# Patient Record
Sex: Male | Born: 1965 | Race: Black or African American | Hispanic: No | Smoking: Current some day smoker
Health system: Southern US, Community
[De-identification: ages and names within clinical notes are randomized; demographics above are authoritative.]

## PROBLEM LIST (undated history)

## (undated) ENCOUNTER — Emergency Department (HOSPITAL_COMMUNITY): Admission: EM | Payer: Medicaid Other

## (undated) VITALS — BP 105/59 | HR 64 | Temp 97.6°F | Resp 20 | Ht 64.0 in | Wt 116.0 lb

## (undated) DIAGNOSIS — F1011 Alcohol abuse, in remission: Secondary | ICD-10-CM

## (undated) DIAGNOSIS — Z720 Tobacco use: Secondary | ICD-10-CM

## (undated) DIAGNOSIS — K259 Gastric ulcer, unspecified as acute or chronic, without hemorrhage or perforation: Secondary | ICD-10-CM

## (undated) DIAGNOSIS — F1210 Cannabis abuse, uncomplicated: Secondary | ICD-10-CM

## (undated) DIAGNOSIS — F1411 Cocaine abuse, in remission: Secondary | ICD-10-CM

## (undated) DIAGNOSIS — R55 Syncope and collapse: Secondary | ICD-10-CM

## (undated) DIAGNOSIS — I2510 Atherosclerotic heart disease of native coronary artery without angina pectoris: Secondary | ICD-10-CM

## (undated) DIAGNOSIS — M1990 Unspecified osteoarthritis, unspecified site: Secondary | ICD-10-CM

## (undated) DIAGNOSIS — R001 Bradycardia, unspecified: Secondary | ICD-10-CM

## (undated) DIAGNOSIS — R9431 Abnormal electrocardiogram [ECG] [EKG]: Secondary | ICD-10-CM

## (undated) DIAGNOSIS — J189 Pneumonia, unspecified organism: Secondary | ICD-10-CM

## (undated) DIAGNOSIS — F319 Bipolar disorder, unspecified: Secondary | ICD-10-CM

## (undated) DIAGNOSIS — C801 Malignant (primary) neoplasm, unspecified: Secondary | ICD-10-CM

## (undated) DIAGNOSIS — K219 Gastro-esophageal reflux disease without esophagitis: Secondary | ICD-10-CM

## (undated) DIAGNOSIS — R0789 Other chest pain: Secondary | ICD-10-CM

## (undated) HISTORY — PX: CARDIAC CATHETERIZATION: SHX172

---

## 1999-09-01 ENCOUNTER — Emergency Department (HOSPITAL_COMMUNITY): Admission: EM | Admit: 1999-09-01 | Discharge: 1999-09-01 | Payer: Self-pay | Admitting: Emergency Medicine

## 1999-09-01 ENCOUNTER — Encounter: Payer: Self-pay | Admitting: Emergency Medicine

## 2002-10-11 ENCOUNTER — Encounter: Payer: Self-pay | Admitting: Emergency Medicine

## 2002-10-11 ENCOUNTER — Emergency Department (HOSPITAL_COMMUNITY): Admission: EM | Admit: 2002-10-11 | Discharge: 2002-10-11 | Payer: Self-pay | Admitting: Emergency Medicine

## 2002-12-05 ENCOUNTER — Emergency Department (HOSPITAL_COMMUNITY): Admission: EM | Admit: 2002-12-05 | Discharge: 2002-12-05 | Payer: Self-pay | Admitting: Emergency Medicine

## 2002-12-05 ENCOUNTER — Encounter: Payer: Self-pay | Admitting: Orthopedic Surgery

## 2002-12-05 ENCOUNTER — Encounter: Payer: Self-pay | Admitting: Emergency Medicine

## 2002-12-17 ENCOUNTER — Emergency Department (HOSPITAL_COMMUNITY): Admission: EM | Admit: 2002-12-17 | Discharge: 2002-12-17 | Payer: Self-pay | Admitting: Emergency Medicine

## 2003-02-24 ENCOUNTER — Encounter: Payer: Self-pay | Admitting: Emergency Medicine

## 2003-02-24 ENCOUNTER — Emergency Department (HOSPITAL_COMMUNITY): Admission: EM | Admit: 2003-02-24 | Discharge: 2003-02-24 | Payer: Self-pay | Admitting: Emergency Medicine

## 2006-06-30 ENCOUNTER — Emergency Department (HOSPITAL_COMMUNITY): Admission: EM | Admit: 2006-06-30 | Discharge: 2006-06-30 | Payer: Self-pay | Admitting: Emergency Medicine

## 2006-07-11 ENCOUNTER — Emergency Department (HOSPITAL_COMMUNITY): Admission: EM | Admit: 2006-07-11 | Discharge: 2006-07-11 | Payer: Self-pay | Admitting: Emergency Medicine

## 2006-07-12 ENCOUNTER — Emergency Department (HOSPITAL_COMMUNITY): Admission: EM | Admit: 2006-07-12 | Discharge: 2006-07-12 | Payer: Self-pay | Admitting: Emergency Medicine

## 2007-04-28 ENCOUNTER — Emergency Department (HOSPITAL_COMMUNITY): Admission: EM | Admit: 2007-04-28 | Discharge: 2007-04-28 | Payer: Self-pay | Admitting: Emergency Medicine

## 2007-07-22 ENCOUNTER — Emergency Department (HOSPITAL_COMMUNITY): Admission: EM | Admit: 2007-07-22 | Discharge: 2007-07-22 | Payer: Self-pay | Admitting: Emergency Medicine

## 2007-10-19 ENCOUNTER — Emergency Department (HOSPITAL_COMMUNITY): Admission: EM | Admit: 2007-10-19 | Discharge: 2007-10-19 | Payer: Self-pay | Admitting: Emergency Medicine

## 2007-11-20 ENCOUNTER — Inpatient Hospital Stay (HOSPITAL_COMMUNITY): Admission: EM | Admit: 2007-11-20 | Discharge: 2007-11-22 | Payer: Self-pay | Admitting: Emergency Medicine

## 2007-11-20 ENCOUNTER — Ambulatory Visit: Payer: Self-pay | Admitting: Cardiology

## 2007-12-02 ENCOUNTER — Emergency Department (HOSPITAL_COMMUNITY): Admission: EM | Admit: 2007-12-02 | Discharge: 2007-12-02 | Payer: Self-pay | Admitting: Emergency Medicine

## 2007-12-23 ENCOUNTER — Emergency Department (HOSPITAL_COMMUNITY): Admission: EM | Admit: 2007-12-23 | Discharge: 2007-12-23 | Payer: Self-pay | Admitting: Emergency Medicine

## 2008-02-18 ENCOUNTER — Inpatient Hospital Stay (HOSPITAL_COMMUNITY): Admission: EM | Admit: 2008-02-18 | Discharge: 2008-02-19 | Payer: Self-pay | Admitting: Emergency Medicine

## 2008-02-18 ENCOUNTER — Ambulatory Visit: Payer: Self-pay | Admitting: Family Medicine

## 2008-02-23 ENCOUNTER — Emergency Department (HOSPITAL_COMMUNITY): Admission: EM | Admit: 2008-02-23 | Discharge: 2008-02-23 | Payer: Self-pay | Admitting: Emergency Medicine

## 2008-02-25 ENCOUNTER — Emergency Department (HOSPITAL_COMMUNITY): Admission: EM | Admit: 2008-02-25 | Discharge: 2008-02-26 | Payer: Self-pay | Admitting: Emergency Medicine

## 2008-02-27 ENCOUNTER — Ambulatory Visit: Payer: Self-pay | Admitting: Internal Medicine

## 2008-02-28 ENCOUNTER — Encounter (INDEPENDENT_AMBULATORY_CARE_PROVIDER_SITE_OTHER): Payer: Self-pay | Admitting: Internal Medicine

## 2008-02-28 LAB — CONVERTED CEMR LAB
ALT: 15 units/L (ref 0–53)
AST: 17 units/L (ref 0–37)
Albumin: 4.3 g/dL (ref 3.5–5.2)
Alkaline Phosphatase: 60 units/L (ref 39–117)
Basophils Absolute: 0 10*3/uL (ref 0.0–0.1)
Basophils Relative: 1 % (ref 0–1)
CO2: 23 meq/L (ref 19–32)
Calcium: 9.2 mg/dL (ref 8.4–10.5)
Chloride: 104 meq/L (ref 96–112)
Creatinine, Ser: 1.14 mg/dL (ref 0.40–1.50)
Glucose, Bld: 74 mg/dL (ref 70–99)
HCT: 42 % (ref 39.0–52.0)
Lymphs Abs: 1.6 10*3/uL (ref 0.7–4.0)
MCHC: 35 g/dL (ref 30.0–36.0)
MCV: 91.1 fL (ref 78.0–100.0)
Monocytes Absolute: 0.5 10*3/uL (ref 0.1–1.0)
RDW: 13.1 % (ref 11.5–15.5)
Total Protein: 7.3 g/dL (ref 6.0–8.3)

## 2008-03-23 ENCOUNTER — Inpatient Hospital Stay (HOSPITAL_COMMUNITY): Admission: AD | Admit: 2008-03-23 | Discharge: 2008-03-27 | Payer: Self-pay | Admitting: Psychiatry

## 2008-03-23 ENCOUNTER — Emergency Department (HOSPITAL_COMMUNITY): Admission: EM | Admit: 2008-03-23 | Discharge: 2008-03-23 | Payer: Self-pay | Admitting: Emergency Medicine

## 2008-03-23 ENCOUNTER — Ambulatory Visit: Payer: Self-pay | Admitting: Psychiatry

## 2008-03-26 ENCOUNTER — Emergency Department (HOSPITAL_COMMUNITY): Admission: EM | Admit: 2008-03-26 | Discharge: 2008-03-26 | Payer: Self-pay | Admitting: Emergency Medicine

## 2008-04-05 ENCOUNTER — Emergency Department (HOSPITAL_COMMUNITY): Admission: EM | Admit: 2008-04-05 | Discharge: 2008-04-05 | Payer: Self-pay | Admitting: Emergency Medicine

## 2008-06-08 ENCOUNTER — Emergency Department (HOSPITAL_COMMUNITY): Admission: EM | Admit: 2008-06-08 | Discharge: 2008-06-09 | Payer: Self-pay | Admitting: Emergency Medicine

## 2008-06-09 ENCOUNTER — Emergency Department (HOSPITAL_COMMUNITY): Admission: EM | Admit: 2008-06-09 | Discharge: 2008-06-09 | Payer: Self-pay | Admitting: Emergency Medicine

## 2008-06-10 ENCOUNTER — Inpatient Hospital Stay (HOSPITAL_COMMUNITY): Admission: EM | Admit: 2008-06-10 | Discharge: 2008-06-11 | Payer: Self-pay | Admitting: Emergency Medicine

## 2008-06-10 ENCOUNTER — Ambulatory Visit: Payer: Self-pay | Admitting: Internal Medicine

## 2008-06-11 ENCOUNTER — Encounter (INDEPENDENT_AMBULATORY_CARE_PROVIDER_SITE_OTHER): Payer: Self-pay | Admitting: *Deleted

## 2008-07-04 ENCOUNTER — Ambulatory Visit: Payer: Self-pay | Admitting: Internal Medicine

## 2008-07-15 ENCOUNTER — Ambulatory Visit: Payer: Self-pay | Admitting: Internal Medicine

## 2008-07-23 ENCOUNTER — Ambulatory Visit: Payer: Self-pay | Admitting: *Deleted

## 2008-08-01 ENCOUNTER — Emergency Department (HOSPITAL_COMMUNITY): Admission: EM | Admit: 2008-08-01 | Discharge: 2008-08-01 | Payer: Self-pay | Admitting: Emergency Medicine

## 2008-08-18 ENCOUNTER — Emergency Department (HOSPITAL_COMMUNITY): Admission: EM | Admit: 2008-08-18 | Discharge: 2008-08-18 | Payer: Self-pay | Admitting: Emergency Medicine

## 2008-10-20 ENCOUNTER — Emergency Department (HOSPITAL_COMMUNITY): Admission: EM | Admit: 2008-10-20 | Discharge: 2008-10-20 | Payer: Self-pay | Admitting: Emergency Medicine

## 2009-01-20 ENCOUNTER — Inpatient Hospital Stay (HOSPITAL_COMMUNITY): Admission: EM | Admit: 2009-01-20 | Discharge: 2009-01-21 | Payer: Self-pay | Admitting: Emergency Medicine

## 2009-01-20 ENCOUNTER — Ambulatory Visit: Payer: Self-pay | Admitting: Infectious Disease

## 2009-01-23 ENCOUNTER — Emergency Department (HOSPITAL_COMMUNITY): Admission: EM | Admit: 2009-01-23 | Discharge: 2009-01-23 | Payer: Self-pay | Admitting: Emergency Medicine

## 2009-04-24 ENCOUNTER — Ambulatory Visit: Payer: Self-pay | Admitting: Internal Medicine

## 2009-05-08 ENCOUNTER — Emergency Department (HOSPITAL_COMMUNITY): Admission: EM | Admit: 2009-05-08 | Discharge: 2009-05-08 | Payer: Self-pay | Admitting: Emergency Medicine

## 2009-05-12 ENCOUNTER — Emergency Department (HOSPITAL_COMMUNITY): Admission: EM | Admit: 2009-05-12 | Discharge: 2009-05-12 | Payer: Self-pay | Admitting: Emergency Medicine

## 2009-05-12 ENCOUNTER — Emergency Department (HOSPITAL_COMMUNITY): Admission: EM | Admit: 2009-05-12 | Discharge: 2009-05-13 | Payer: Self-pay | Admitting: Emergency Medicine

## 2009-05-17 ENCOUNTER — Emergency Department (HOSPITAL_COMMUNITY): Admission: EM | Admit: 2009-05-17 | Discharge: 2009-05-17 | Payer: Self-pay | Admitting: Emergency Medicine

## 2009-05-23 ENCOUNTER — Emergency Department (HOSPITAL_COMMUNITY): Admission: EM | Admit: 2009-05-23 | Discharge: 2009-05-23 | Payer: Self-pay | Admitting: Emergency Medicine

## 2009-05-26 ENCOUNTER — Emergency Department (HOSPITAL_COMMUNITY): Admission: EM | Admit: 2009-05-26 | Discharge: 2009-05-26 | Payer: Self-pay | Admitting: Emergency Medicine

## 2009-05-27 ENCOUNTER — Ambulatory Visit: Payer: Self-pay | Admitting: Internal Medicine

## 2009-06-18 ENCOUNTER — Ambulatory Visit (HOSPITAL_COMMUNITY): Admission: RE | Admit: 2009-06-18 | Discharge: 2009-06-18 | Payer: Self-pay | Admitting: Internal Medicine

## 2009-06-23 ENCOUNTER — Ambulatory Visit: Payer: Self-pay | Admitting: Internal Medicine

## 2009-10-01 ENCOUNTER — Ambulatory Visit: Payer: Self-pay | Admitting: Internal Medicine

## 2009-10-08 ENCOUNTER — Other Ambulatory Visit: Payer: Self-pay | Admitting: Emergency Medicine

## 2009-10-08 ENCOUNTER — Ambulatory Visit: Payer: Self-pay | Admitting: Psychiatry

## 2009-10-08 ENCOUNTER — Inpatient Hospital Stay (HOSPITAL_COMMUNITY): Admission: EM | Admit: 2009-10-08 | Discharge: 2009-10-11 | Payer: Self-pay | Admitting: Psychiatry

## 2010-01-08 ENCOUNTER — Other Ambulatory Visit: Payer: Self-pay

## 2010-01-09 ENCOUNTER — Inpatient Hospital Stay (HOSPITAL_COMMUNITY): Admission: RE | Admit: 2010-01-09 | Discharge: 2010-01-12 | Payer: Self-pay | Admitting: Psychiatry

## 2010-01-09 ENCOUNTER — Ambulatory Visit: Payer: Self-pay | Admitting: Psychiatry

## 2010-03-12 ENCOUNTER — Emergency Department (HOSPITAL_COMMUNITY): Admission: EM | Admit: 2010-03-12 | Discharge: 2010-03-12 | Payer: Self-pay | Admitting: Emergency Medicine

## 2010-04-28 ENCOUNTER — Emergency Department (HOSPITAL_COMMUNITY): Admission: EM | Admit: 2010-04-28 | Discharge: 2010-04-28 | Payer: Self-pay | Admitting: Emergency Medicine

## 2010-04-29 ENCOUNTER — Emergency Department (HOSPITAL_COMMUNITY): Admission: EM | Admit: 2010-04-29 | Discharge: 2010-04-30 | Payer: Self-pay | Admitting: Emergency Medicine

## 2010-05-17 ENCOUNTER — Emergency Department (HOSPITAL_COMMUNITY)
Admission: EM | Admit: 2010-05-17 | Discharge: 2010-05-17 | Disposition: A | Discharge status: Other Acute Inpatient Hospital | Payer: Self-pay | Source: Home / Self Care | Admitting: Obstetrics and Gynecology

## 2010-05-18 ENCOUNTER — Inpatient Hospital Stay (HOSPITAL_COMMUNITY)
Admission: AD | Admit: 2010-05-18 | Discharge: 2010-05-24 | Payer: Self-pay | Source: Home / Self Care | Attending: Psychiatry | Admitting: Psychiatry

## 2010-05-18 ENCOUNTER — Emergency Department (HOSPITAL_COMMUNITY)
Admission: EM | Admit: 2010-05-18 | Discharge: 2010-05-18 | Payer: Self-pay | Source: Home / Self Care | Admitting: Emergency Medicine

## 2010-05-18 DIAGNOSIS — F1910 Other psychoactive substance abuse, uncomplicated: Secondary | ICD-10-CM

## 2010-06-28 ENCOUNTER — Encounter: Payer: Self-pay | Admitting: Internal Medicine

## 2010-08-18 LAB — DIFFERENTIAL
Eosinophils Absolute: 0 10*3/uL (ref 0.0–0.7)
Lymphocytes Relative: 20 % (ref 12–46)
Lymphs Abs: 1.5 10*3/uL (ref 0.7–4.0)
Monocytes Relative: 5 % (ref 3–12)
Neutro Abs: 5.4 10*3/uL (ref 1.7–7.7)
Neutrophils Relative %: 74 % (ref 43–77)

## 2010-08-18 LAB — CBC
Hemoglobin: 14.8 g/dL (ref 13.0–17.0)
MCV: 92.1 fL (ref 78.0–100.0)
Platelets: 231 10*3/uL (ref 150–400)
RDW: 12.8 % (ref 11.5–15.5)
WBC: 7.4 10*3/uL (ref 4.0–10.5)

## 2010-08-18 LAB — BASIC METABOLIC PANEL
BUN: 8 mg/dL (ref 6–23)
Calcium: 9 mg/dL (ref 8.4–10.5)
Creatinine, Ser: 1.26 mg/dL (ref 0.4–1.5)
GFR calc Af Amer: >60 mL/min (ref >60)
GFR calc non Af Amer: >60 mL/min (ref >60)
Glucose, Bld: 71 mg/dL (ref 70–99)
Potassium: 3.3 mEq/L — ABNORMAL LOW (ref 3.5–5.1)
Sodium: 140 mEq/L (ref 135–145)

## 2010-08-19 ENCOUNTER — Emergency Department (HOSPITAL_COMMUNITY)
Admission: EM | Admit: 2010-08-19 | Discharge: 2010-08-20 | Disposition: A | Discharge status: Home / Self Care | Payer: Medicaid Other | Attending: Emergency Medicine | Admitting: Emergency Medicine

## 2010-08-19 DIAGNOSIS — F411 Generalized anxiety disorder: Secondary | ICD-10-CM | POA: Insufficient documentation

## 2010-08-19 DIAGNOSIS — I2510 Atherosclerotic heart disease of native coronary artery without angina pectoris: Secondary | ICD-10-CM | POA: Insufficient documentation

## 2010-08-19 DIAGNOSIS — R1013 Epigastric pain: Secondary | ICD-10-CM | POA: Insufficient documentation

## 2010-08-19 LAB — COMPREHENSIVE METABOLIC PANEL
ALT: 42 U/L (ref 0–53)
AST: 49 U/L — ABNORMAL HIGH (ref 0–37)
Alkaline Phosphatase: 70 U/L (ref 39–117)
CO2: 26 mEq/L (ref 19–32)
Calcium: 8.8 mg/dL (ref 8.4–10.5)
Chloride: 100 mEq/L (ref 96–112)
GFR calc Af Amer: >60 mL/min (ref >60)
GFR calc non Af Amer: 58 mL/min — ABNORMAL LOW (ref >60)
Glucose, Bld: 82 mg/dL (ref 70–99)
Sodium: 131 mEq/L — ABNORMAL LOW (ref 135–145)
Total Bilirubin: 0.6 mg/dL (ref 0.3–1.2)

## 2010-08-19 LAB — DIFFERENTIAL
Basophils Absolute: 0 10*3/uL (ref 0.0–0.1)
Basophils Absolute: 0 10*3/uL (ref 0.0–0.1)
Basophils Relative: 0 % (ref 0–1)
Basophils Relative: 1 % (ref 0–1)
Eosinophils Absolute: 0.1 10*3/uL (ref 0.0–0.7)
Eosinophils Relative: 3 % (ref 0–5)
Lymphocytes Relative: 25 % (ref 12–46)
Lymphs Abs: 1.7 10*3/uL (ref 0.7–4.0)
Neutro Abs: 2.6 10*3/uL (ref 1.7–7.7)
Neutrophils Relative %: 41 % — ABNORMAL LOW (ref 43–77)

## 2010-08-19 LAB — CBC
HCT: 39.3 % (ref 39.0–52.0)
Hemoglobin: 13.9 g/dL (ref 13.0–17.0)
MCHC: 35.4 g/dL (ref 30.0–36.0)
MCHC: 35.5 g/dL (ref 30.0–36.0)
Platelets: 159 10*3/uL (ref 150–400)
RBC: 4.32 MIL/uL (ref 4.22–5.81)
RDW: 12.9 % (ref 11.5–15.5)
WBC: 4.7 10*3/uL (ref 4.0–10.5)

## 2010-08-19 LAB — BASIC METABOLIC PANEL
BUN: 5 mg/dL — ABNORMAL LOW (ref 6–23)
Calcium: 8.9 mg/dL (ref 8.4–10.5)
Creatinine, Ser: 1.12 mg/dL (ref 0.4–1.5)
GFR calc non Af Amer: >60 mL/min (ref >60)
Glucose, Bld: 101 mg/dL — ABNORMAL HIGH (ref 70–99)

## 2010-08-19 LAB — POCT CARDIAC MARKERS
CKMB, poc: <1 ng/mL — ABNORMAL LOW (ref 1.0–8.0)
Myoglobin, poc: 65.9 ng/mL (ref 12–200)
Troponin i, poc: <0.05 ng/mL (ref 0.00–0.09)
Troponin i, poc: <0.05 ng/mL (ref 0.00–0.09)
Troponin i, poc: <0.05 ng/mL (ref 0.00–0.09)
Troponin i, poc: <0.05 ng/mL (ref 0.00–0.09)

## 2010-08-19 LAB — URINALYSIS, ROUTINE W REFLEX MICROSCOPIC
Bilirubin Urine: NEGATIVE
Ketones, ur: NEGATIVE mg/dL
Nitrite: NEGATIVE
Protein, ur: NEGATIVE mg/dL
Urobilinogen, UA: 0.2 mg/dL (ref 0.0–1.0)
pH: 6.5 (ref 5.0–8.0)

## 2010-08-19 LAB — RAPID URINE DRUG SCREEN, HOSP PERFORMED
Amphetamines: NOT DETECTED
Benzodiazepines: NOT DETECTED
Cocaine: NOT DETECTED
Cocaine: NOT DETECTED
Opiates: NOT DETECTED
Tetrahydrocannabinol: POSITIVE — AB
Tetrahydrocannabinol: POSITIVE — AB

## 2010-08-19 LAB — D-DIMER, QUANTITATIVE: D-Dimer, Quant: 0.24 ug/mL-FEU (ref 0.00–0.48)

## 2010-08-19 IMAGING — CR DG LUMBAR SPINE COMPLETE 4+V
5 series · 5 of 5 positions shown · non-contrast
Comparison: None

CLINICAL DATA: Pain

LUMBAR SPINE - COMPLETE 4+ VIEW

[t l-spine a.p.]
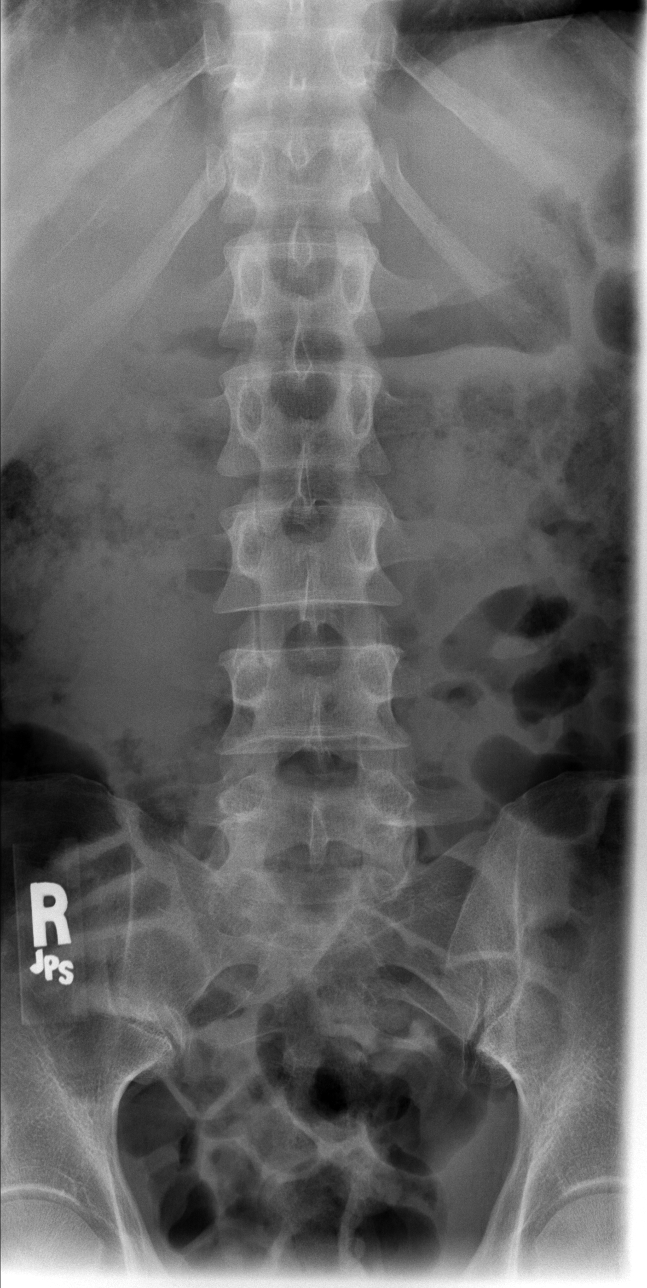

[t l-spine oblique exposure (1 of 2)]
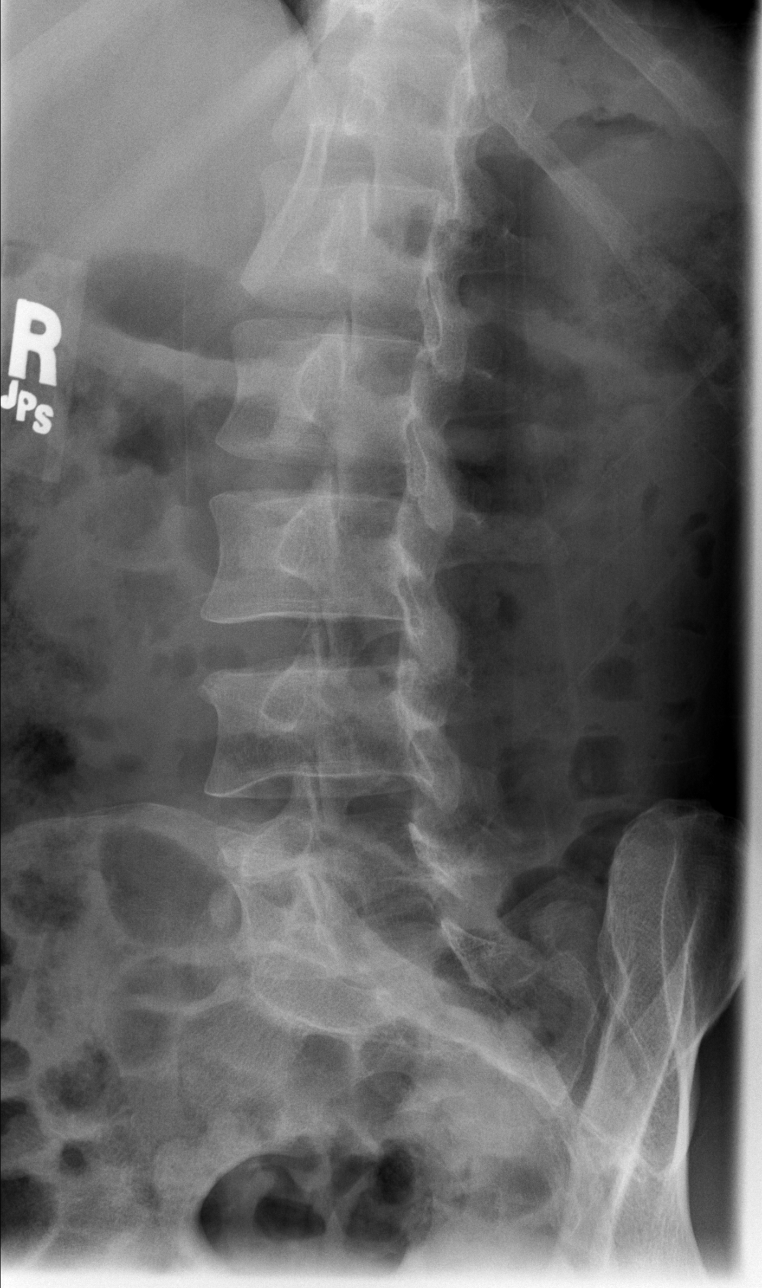

[t l-spine oblique exposure (2 of 2)]
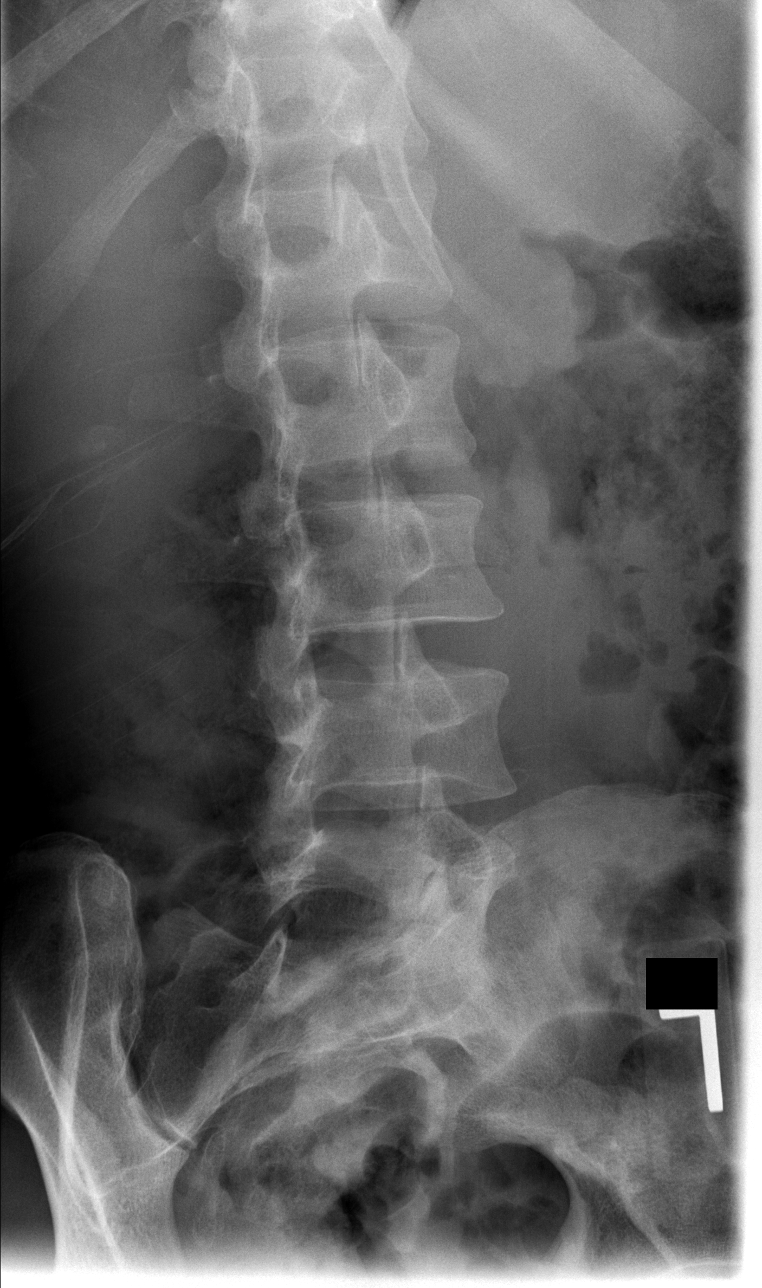

[t l-spine lat]
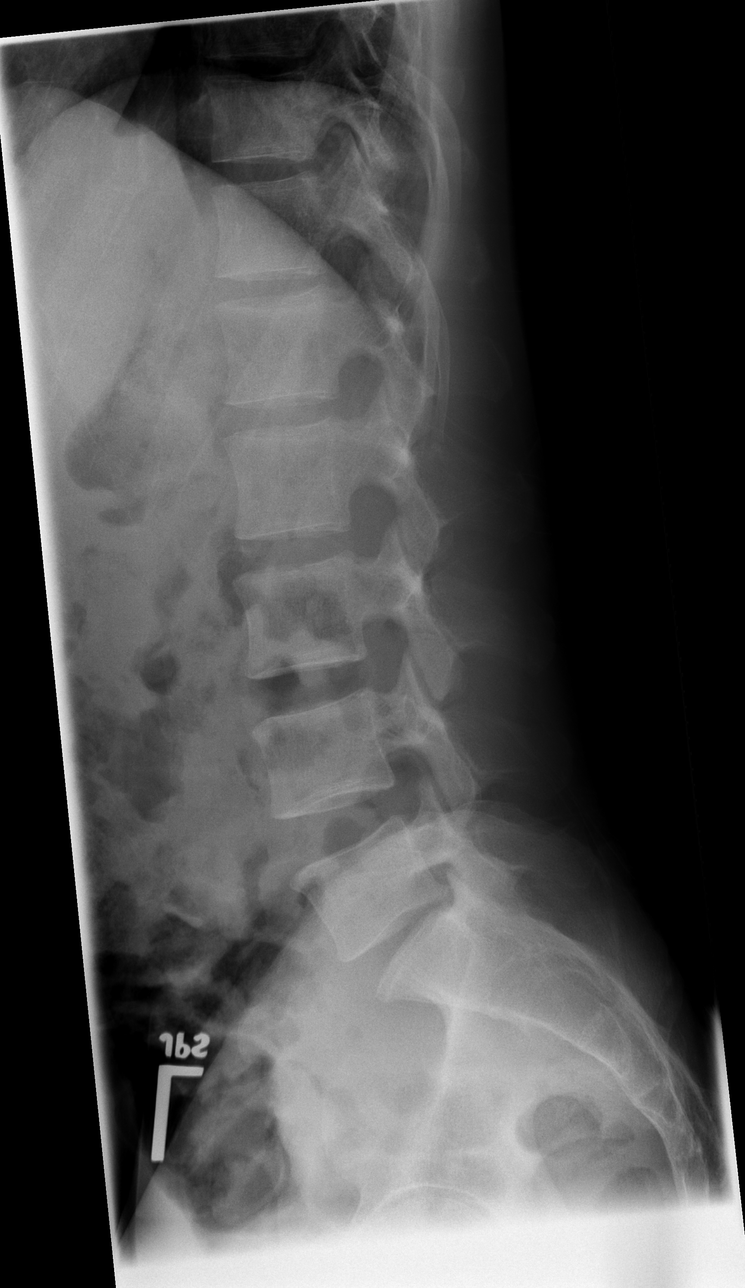

[t l-spine l5-s1 spot]
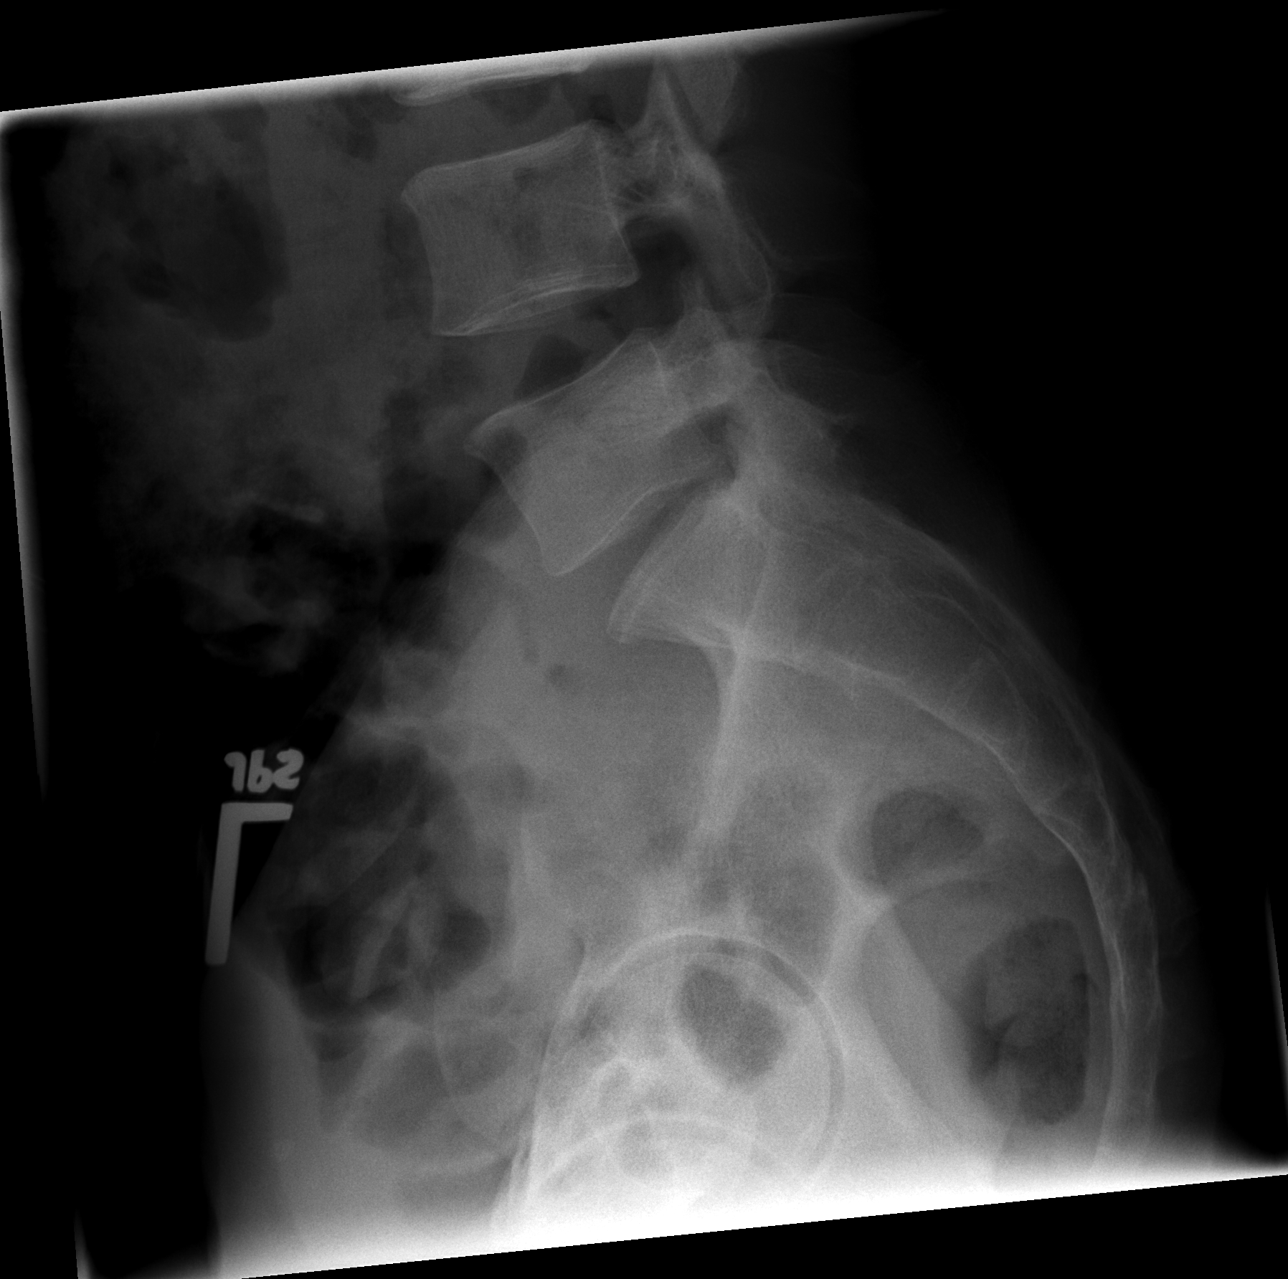

[5 of 5 positions shown; findings below may reference images not displayed]

FINDINGS: Five views of the lumbar spine submitted.  No acute
fracture or subluxation.  Moderate disc space flattening noted at
L5 S1 level with mild narrowing of the neural foramina.
IMPRESSION: No acute fracture or subluxation.  Disc space flattening at L5-S1
level.

## 2010-08-20 LAB — COMPREHENSIVE METABOLIC PANEL WITH GFR
CO2: 25 meq/L (ref 19–32)
Calcium: 8.9 mg/dL (ref 8.4–10.5)
Chloride: 107 meq/L (ref 96–112)
Creatinine, Ser: 0.99 mg/dL (ref 0.4–1.5)
GFR calc non Af Amer: >60 mL/min (ref >60)
Glucose, Bld: 84 mg/dL (ref 70–99)
Total Bilirubin: 0.6 mg/dL (ref 0.3–1.2)

## 2010-08-20 LAB — DIFFERENTIAL
Basophils Absolute: 0 K/uL (ref 0.0–0.1)
Basophils Relative: 1 % (ref 0–1)
Eosinophils Absolute: 0.1 K/uL (ref 0.0–0.7)
Eosinophils Relative: 0 % (ref 0–5)
Eosinophils Relative: 5 % (ref 0–5)
Lymphocytes Relative: 21 % (ref 12–46)
Lymphocytes Relative: 35 % (ref 12–46)
Lymphs Abs: 2.3 10*3/uL (ref 0.7–4.0)
Lymphs Abs: 1.1 K/uL (ref 0.7–4.0)
Monocytes Absolute: 0.4 10*3/uL (ref 0.1–1.0)
Monocytes Relative: 8 % (ref 3–12)
Monocytes Relative: 14 % — ABNORMAL HIGH (ref 3–12)
Neutro Abs: 1.4 10*3/uL — ABNORMAL LOW (ref 1.7–7.7)
Neutrophils Relative %: 46 % (ref 43–77)

## 2010-08-20 LAB — COMPREHENSIVE METABOLIC PANEL
ALT: 25 U/L (ref 0–53)
AST: 40 U/L — ABNORMAL HIGH (ref 0–37)
Albumin: 3.6 g/dL (ref 3.5–5.2)
Alkaline Phosphatase: 89 U/L (ref 39–117)
Alkaline Phosphatase: 65 U/L (ref 39–117)
BUN: 14 mg/dL (ref 6–23)
BUN: 8 mg/dL (ref 6–23)
Chloride: 104 mEq/L (ref 96–112)
Creatinine, Ser: 1.2 mg/dL (ref 0.4–1.5)
GFR calc Af Amer: >60 mL/min (ref >60)
GFR calc non Af Amer: >60 mL/min (ref >60)
Glucose, Bld: 67 mg/dL — ABNORMAL LOW (ref 70–99)
Potassium: 4 mEq/L (ref 3.5–5.1)
Potassium: 3.8 mEq/L (ref 3.5–5.1)
Sodium: 139 mEq/L (ref 135–145)
Total Bilirubin: 0.8 mg/dL (ref 0.3–1.2)
Total Protein: 6.3 g/dL (ref 6.0–8.3)

## 2010-08-20 LAB — CBC
HCT: 39.4 % (ref 39.0–52.0)
HCT: 38.4 % — ABNORMAL LOW (ref 39.0–52.0)
Hemoglobin: 13.4 g/dL (ref 13.0–17.0)
MCH: 30.8 pg (ref 26.0–34.0)
MCH: 31.8 pg (ref 26.0–34.0)
MCHC: 34.9 g/dL (ref 30.0–36.0)
MCV: 89.7 fL (ref 78.0–100.0)
MCV: 91.2 fL (ref 78.0–100.0)
Platelets: 164 10*3/uL (ref 150–400)
RBC: 4.39 MIL/uL (ref 4.22–5.81)
RBC: 4.21 MIL/uL — ABNORMAL LOW (ref 4.22–5.81)
RDW: 13.3 % (ref 11.5–15.5)
WBC: 10.9 10*3/uL — ABNORMAL HIGH (ref 4.0–10.5)
WBC: 3 10*3/uL — ABNORMAL LOW (ref 4.0–10.5)

## 2010-08-20 LAB — POCT CARDIAC MARKERS
Myoglobin, poc: 40 ng/mL (ref 12–200)
Myoglobin, poc: 41.5 ng/mL (ref 12–200)

## 2010-08-20 LAB — LIPASE, BLOOD
Lipase: 29 U/L (ref 11–59)
Lipase: 45 U/L (ref 11–59)

## 2010-08-21 LAB — DIFFERENTIAL
Basophils Absolute: 0 10*3/uL (ref 0.0–0.1)
Basophils Relative: 1 % (ref 0–1)
Monocytes Absolute: 0.4 10*3/uL (ref 0.1–1.0)
Neutro Abs: 1.6 10*3/uL — ABNORMAL LOW (ref 1.7–7.7)
Neutrophils Relative %: 34 % — ABNORMAL LOW (ref 43–77)

## 2010-08-21 LAB — COMPREHENSIVE METABOLIC PANEL
Alkaline Phosphatase: 73 U/L (ref 39–117)
BUN: 8 mg/dL (ref 6–23)
CO2: 24 mEq/L (ref 19–32)
Chloride: 109 mEq/L (ref 96–112)
Creatinine, Ser: 1.16 mg/dL (ref 0.4–1.5)
GFR calc non Af Amer: >60 mL/min (ref >60)
Glucose, Bld: 78 mg/dL (ref 70–99)
Potassium: 3.9 mEq/L (ref 3.5–5.1)
Total Bilirubin: 0.7 mg/dL (ref 0.3–1.2)

## 2010-08-21 LAB — LIPID PANEL
Cholesterol: 196 mg/dL (ref 0–200)
LDL Cholesterol: 73 mg/dL (ref 0–99)
Triglycerides: 94 mg/dL (ref <150)

## 2010-08-21 LAB — RAPID URINE DRUG SCREEN, HOSP PERFORMED
Amphetamines: NOT DETECTED
Barbiturates: NOT DETECTED
Tetrahydrocannabinol: POSITIVE — AB

## 2010-08-21 LAB — CBC
HCT: 44.2 % (ref 39.0–52.0)
MCH: 33 pg (ref 26.0–34.0)
MCV: 94.4 fL (ref 78.0–100.0)
RBC: 4.68 MIL/uL (ref 4.22–5.81)
WBC: 4.8 10*3/uL (ref 4.0–10.5)

## 2010-08-21 LAB — TSH: TSH: 1.237 u[IU]/mL (ref 0.350–4.500)

## 2010-08-25 LAB — BASIC METABOLIC PANEL
Chloride: 106 mEq/L (ref 96–112)
GFR calc non Af Amer: >60 mL/min (ref >60)
Glucose, Bld: 110 mg/dL — ABNORMAL HIGH (ref 70–99)
Potassium: 3.5 mEq/L (ref 3.5–5.1)
Sodium: 139 mEq/L (ref 135–145)

## 2010-08-25 LAB — DIFFERENTIAL
Basophils Absolute: 0 10*3/uL (ref 0.0–0.1)
Eosinophils Relative: 1 % (ref 0–5)
Lymphocytes Relative: 32 % (ref 12–46)
Lymphs Abs: 1.7 10*3/uL (ref 0.7–4.0)
Monocytes Absolute: 0.5 10*3/uL (ref 0.1–1.0)

## 2010-08-25 LAB — CBC
HCT: 42.8 % (ref 39.0–52.0)
Hemoglobin: 14 g/dL (ref 13.0–17.0)
RDW: 13.7 % (ref 11.5–15.5)

## 2010-08-25 LAB — RAPID URINE DRUG SCREEN, HOSP PERFORMED: Benzodiazepines: NOT DETECTED

## 2010-09-08 LAB — RAPID URINE DRUG SCREEN, HOSP PERFORMED
Amphetamines: NOT DETECTED
Barbiturates: NOT DETECTED
Benzodiazepines: POSITIVE — AB
Tetrahydrocannabinol: NOT DETECTED

## 2010-09-08 LAB — DIFFERENTIAL
Basophils Absolute: 0 10*3/uL (ref 0.0–0.1)
Basophils Relative: 0 % (ref 0–1)
Eosinophils Relative: 0 % (ref 0–5)
Monocytes Absolute: 0 10*3/uL — ABNORMAL LOW (ref 0.1–1.0)

## 2010-09-08 LAB — ETHANOL: Alcohol, Ethyl (B): 94 mg/dL — ABNORMAL HIGH (ref 0–10)

## 2010-09-08 LAB — COMPREHENSIVE METABOLIC PANEL
AST: 19 U/L (ref 0–37)
Albumin: 4.1 g/dL (ref 3.5–5.2)
Alkaline Phosphatase: 49 U/L (ref 39–117)
BUN: 16 mg/dL (ref 6–23)
CO2: 26 mEq/L (ref 19–32)
Chloride: 100 mEq/L (ref 96–112)
GFR calc Af Amer: >60 mL/min (ref >60)
Potassium: 3.8 mEq/L (ref 3.5–5.1)
Total Bilirubin: 0.9 mg/dL (ref 0.3–1.2)

## 2010-09-08 LAB — CBC
HCT: 42.5 % (ref 39.0–52.0)
Platelets: 187 10*3/uL (ref 150–400)
RBC: 4.52 MIL/uL (ref 4.22–5.81)
WBC: 5.3 10*3/uL (ref 4.0–10.5)

## 2010-09-08 LAB — URINALYSIS, ROUTINE W REFLEX MICROSCOPIC
Nitrite: NEGATIVE
Protein, ur: NEGATIVE mg/dL
Specific Gravity, Urine: 1.021 (ref 1.005–1.030)
Urobilinogen, UA: 0.2 mg/dL (ref 0.0–1.0)

## 2010-09-12 LAB — DIFFERENTIAL
Basophils Absolute: 0 10*3/uL (ref 0.0–0.1)
Basophils Absolute: 0 10*3/uL (ref 0.0–0.1)
Eosinophils Relative: 3 % (ref 0–5)
Lymphocytes Relative: 42 % (ref 12–46)
Lymphocytes Relative: 33 % (ref 12–46)
Lymphs Abs: 2 10*3/uL (ref 0.7–4.0)
Monocytes Absolute: 0.5 10*3/uL (ref 0.1–1.0)
Neutro Abs: 2.1 10*3/uL (ref 1.7–7.7)
Neutro Abs: 2.5 10*3/uL (ref 1.7–7.7)
Neutrophils Relative %: 44 % (ref 43–77)
Neutrophils Relative %: 52 % (ref 43–77)

## 2010-09-12 LAB — IRON AND TIBC
Saturation Ratios: 6 % — ABNORMAL LOW (ref 20–55)
TIBC: 219 ug/dL (ref 215–435)

## 2010-09-12 LAB — COMPREHENSIVE METABOLIC PANEL
BUN: 6 mg/dL (ref 6–23)
CO2: 23 mEq/L (ref 19–32)
Chloride: 109 mEq/L (ref 96–112)
Creatinine, Ser: 0.98 mg/dL (ref 0.4–1.5)
GFR calc non Af Amer: >60 mL/min (ref >60)
Total Bilirubin: 0.6 mg/dL (ref 0.3–1.2)

## 2010-09-12 LAB — CARDIAC PANEL(CRET KIN+CKTOT+MB+TROPI)
CK, MB: 0.8 ng/mL (ref 0.3–4.0)
CK, MB: 1 ng/mL (ref 0.3–4.0)
Relative Index: 0.4 (ref 0.0–2.5)
Total CK: 335 U/L — ABNORMAL HIGH (ref 7–232)
Troponin I: 0.01 ng/mL (ref 0.00–0.06)

## 2010-09-12 LAB — CBC
HCT: 36.8 % — ABNORMAL LOW (ref 39.0–52.0)
Hemoglobin: 12.6 g/dL — ABNORMAL LOW (ref 13.0–17.0)
Hemoglobin: 13.8 g/dL (ref 13.0–17.0)
MCHC: 34 g/dL (ref 30.0–36.0)
MCV: 93.8 fL (ref 78.0–100.0)
Platelets: 135 10*3/uL — ABNORMAL LOW (ref 150–400)
RBC: 3.94 MIL/uL — ABNORMAL LOW (ref 4.22–5.81)
RDW: 13.3 % (ref 11.5–15.5)
RDW: 13.7 % (ref 11.5–15.5)
WBC: 4.7 10*3/uL (ref 4.0–10.5)

## 2010-09-12 LAB — VITAMIN B12: Vitamin B-12: 320 pg/mL (ref 211–911)

## 2010-09-12 LAB — POCT I-STAT, CHEM 8
BUN: 7 mg/dL (ref 6–23)
BUN: 14 mg/dL (ref 6–23)
Calcium, Ion: 1.12 mmol/L (ref 1.12–1.32)
Creatinine, Ser: 1.3 mg/dL (ref 0.4–1.5)
Glucose, Bld: 52 mg/dL — ABNORMAL LOW (ref 70–99)
Hemoglobin: 15 g/dL (ref 13.0–17.0)
Potassium: 3.3 mEq/L — ABNORMAL LOW (ref 3.5–5.1)
Sodium: 138 mEq/L (ref 135–145)
Sodium: 138 mEq/L (ref 135–145)
TCO2: 22 mmol/L (ref 0–100)
TCO2: 25 mmol/L (ref 0–100)

## 2010-09-12 LAB — AMYLASE: Amylase: 82 U/L (ref 27–131)

## 2010-09-12 LAB — POCT CARDIAC MARKERS
CKMB, poc: <1 ng/mL — ABNORMAL LOW (ref 1.0–8.0)
CKMB, poc: <1 ng/mL — ABNORMAL LOW (ref 1.0–8.0)
Myoglobin, poc: 52.3 ng/mL (ref 12–200)
Myoglobin, poc: 38.8 ng/mL (ref 12–200)
Troponin i, poc: <0.05 ng/mL (ref 0.00–0.09)
Troponin i, poc: <0.05 ng/mL (ref 0.00–0.09)

## 2010-09-12 LAB — BASIC METABOLIC PANEL
CO2: 27 mEq/L (ref 19–32)
Calcium: 8.8 mg/dL (ref 8.4–10.5)
Creatinine, Ser: 1.05 mg/dL (ref 0.4–1.5)
Glucose, Bld: 91 mg/dL (ref 70–99)

## 2010-09-12 LAB — RAPID URINE DRUG SCREEN, HOSP PERFORMED
Amphetamines: NOT DETECTED
Barbiturates: NOT DETECTED
Barbiturates: POSITIVE — AB
Benzodiazepines: NOT DETECTED
Cocaine: NOT DETECTED
Opiates: NOT DETECTED
Tetrahydrocannabinol: POSITIVE — AB

## 2010-09-12 LAB — RETICULOCYTES
RBC.: 3.77 MIL/uL — ABNORMAL LOW (ref 4.22–5.81)
Retic Count, Absolute: 41.5 10*3/uL (ref 19.0–186.0)
Retic Ct Pct: 1.1 % (ref 0.4–3.1)

## 2010-09-12 LAB — LIPASE, BLOOD: Lipase: 39 U/L (ref 11–59)

## 2010-09-12 LAB — FOLATE RBC: RBC Folate: 690 ng/mL — ABNORMAL HIGH (ref 180–600)

## 2010-09-12 LAB — LIPID PANEL
HDL: 62 mg/dL (ref >39)
LDL Cholesterol: 73 mg/dL (ref 0–99)
Total CHOL/HDL Ratio: 2.4 RATIO
VLDL: 13 mg/dL (ref 0–40)

## 2010-09-12 LAB — CK TOTAL AND CKMB (NOT AT ARMC)
Relative Index: 0.3 (ref 0.0–2.5)
Total CK: 346 U/L — ABNORMAL HIGH (ref 7–232)

## 2010-09-12 LAB — ETHANOL: Alcohol, Ethyl (B): <5 mg/dL (ref 0–10)

## 2010-09-12 LAB — GLUCOSE, CAPILLARY: Glucose-Capillary: 104 mg/dL — ABNORMAL HIGH (ref 70–99)

## 2010-09-12 LAB — MAGNESIUM: Magnesium: 1.9 mg/dL (ref 1.5–2.5)

## 2010-09-21 LAB — POCT I-STAT, CHEM 8
Chloride: 101 mEq/L (ref 96–112)
Chloride: 103 mEq/L (ref 96–112)
Glucose, Bld: 96 mg/dL (ref 70–99)
HCT: 43 % (ref 39.0–52.0)
HCT: 43 % (ref 39.0–52.0)
HCT: 43 % (ref 39.0–52.0)
Hemoglobin: 14.6 g/dL (ref 13.0–17.0)
Hemoglobin: 14.6 g/dL (ref 13.0–17.0)
Hemoglobin: 14.6 g/dL (ref 13.0–17.0)
Potassium: 3.8 mEq/L (ref 3.5–5.1)
Potassium: 4 mEq/L (ref 3.5–5.1)
Potassium: 4.2 mEq/L (ref 3.5–5.1)
Sodium: 135 mEq/L (ref 135–145)
Sodium: 136 mEq/L (ref 135–145)
Sodium: 136 mEq/L (ref 135–145)
TCO2: 26 mmol/L (ref 0–100)

## 2010-09-21 LAB — DIFFERENTIAL
Basophils Absolute: 0 10*3/uL (ref 0.0–0.1)
Basophils Relative: 0 % (ref 0–1)
Basophils Relative: 0 % (ref 0–1)
Eosinophils Absolute: 0.1 10*3/uL (ref 0.0–0.7)
Eosinophils Absolute: 0.1 10*3/uL (ref 0.0–0.7)
Eosinophils Relative: 1 % (ref 0–5)
Eosinophils Relative: 2 % (ref 0–5)
Lymphs Abs: 1.5 10*3/uL (ref 0.7–4.0)
Monocytes Absolute: 0.7 10*3/uL (ref 0.1–1.0)
Monocytes Relative: 14 % — ABNORMAL HIGH (ref 3–12)
Neutrophils Relative %: 57 % (ref 43–77)

## 2010-09-21 LAB — COMPREHENSIVE METABOLIC PANEL
ALT: 22 U/L (ref 0–53)
Albumin: 3.8 g/dL (ref 3.5–5.2)
Alkaline Phosphatase: 50 U/L (ref 39–117)
BUN: 12 mg/dL (ref 6–23)
Chloride: 100 mEq/L (ref 96–112)
Potassium: 3.9 mEq/L (ref 3.5–5.1)
Sodium: 135 mEq/L (ref 135–145)
Total Bilirubin: 1 mg/dL (ref 0.3–1.2)
Total Protein: 6.9 g/dL (ref 6.0–8.3)

## 2010-09-21 LAB — BASIC METABOLIC PANEL
BUN: 11 mg/dL (ref 6–23)
CO2: 28 mEq/L (ref 19–32)
Chloride: 107 mEq/L (ref 96–112)
Potassium: 4 mEq/L (ref 3.5–5.1)

## 2010-09-21 LAB — POCT CARDIAC MARKERS
CKMB, poc: <1 ng/mL — ABNORMAL LOW (ref 1.0–8.0)
CKMB, poc: <1 ng/mL — ABNORMAL LOW (ref 1.0–8.0)
CKMB, poc: <1 ng/mL — ABNORMAL LOW (ref 1.0–8.0)
CKMB, poc: <1 ng/mL — ABNORMAL LOW (ref 1.0–8.0)
CKMB, poc: <1 ng/mL — ABNORMAL LOW (ref 1.0–8.0)
Myoglobin, poc: 60.6 ng/mL (ref 12–200)
Myoglobin, poc: 69.1 ng/mL (ref 12–200)
Myoglobin, poc: 82.9 ng/mL (ref 12–200)
Myoglobin, poc: 87.3 ng/mL (ref 12–200)
Troponin i, poc: <0.05 ng/mL (ref 0.00–0.09)
Troponin i, poc: <0.05 ng/mL (ref 0.00–0.09)
Troponin i, poc: <0.05 ng/mL (ref 0.00–0.09)

## 2010-09-21 LAB — CBC
HCT: 38 % — ABNORMAL LOW (ref 39.0–52.0)
HCT: 40.1 % (ref 39.0–52.0)
HCT: 40.8 % (ref 39.0–52.0)
Hemoglobin: 12.8 g/dL — ABNORMAL LOW (ref 13.0–17.0)
MCHC: 33.5 g/dL (ref 30.0–36.0)
MCV: 92.3 fL (ref 78.0–100.0)
Platelets: 145 10*3/uL — ABNORMAL LOW (ref 150–400)
Platelets: 150 10*3/uL (ref 150–400)
Platelets: 162 10*3/uL (ref 150–400)
RBC: 4.29 MIL/uL (ref 4.22–5.81)
RDW: 13.8 % (ref 11.5–15.5)
WBC: 4.4 10*3/uL (ref 4.0–10.5)
WBC: 5.1 10*3/uL (ref 4.0–10.5)

## 2010-09-21 LAB — LIPID PANEL
Cholesterol: 178 mg/dL (ref 0–200)
HDL: 59 mg/dL (ref >39)
Total CHOL/HDL Ratio: 3 RATIO
Triglycerides: 116 mg/dL (ref <150)

## 2010-09-21 LAB — D-DIMER, QUANTITATIVE: D-Dimer, Quant: 0.31 ug/mL-FEU (ref 0.00–0.48)

## 2010-09-21 LAB — CARDIAC PANEL(CRET KIN+CKTOT+MB+TROPI)
Relative Index: 0.4 (ref 0.0–2.5)
Relative Index: 0.4 (ref 0.0–2.5)
Relative Index: 0.4 (ref 0.0–2.5)
Total CK: 343 U/L — ABNORMAL HIGH (ref 7–232)
Troponin I: <0.01 ng/mL (ref 0.00–0.06)
Troponin I: <0.01 ng/mL (ref 0.00–0.06)
Troponin I: <0.01 ng/mL (ref 0.00–0.06)

## 2010-09-21 LAB — PROTIME-INR
INR: 1.1 (ref 0.00–1.49)
Prothrombin Time: 14.3 seconds (ref 11.6–15.2)

## 2010-09-21 LAB — RAPID URINE DRUG SCREEN, HOSP PERFORMED
Amphetamines: NOT DETECTED
Barbiturates: NOT DETECTED
Benzodiazepines: NOT DETECTED
Cocaine: POSITIVE — AB
Tetrahydrocannabinol: POSITIVE — AB

## 2010-09-21 LAB — APTT: aPTT: 30 seconds (ref 24–37)

## 2010-09-21 LAB — TSH: TSH: 0.5 u[IU]/mL (ref 0.350–4.500)

## 2010-09-21 LAB — ETHANOL: Alcohol, Ethyl (B): <5 mg/dL (ref 0–10)

## 2010-09-21 LAB — PHOSPHORUS: Phosphorus: 3.9 mg/dL (ref 2.3–4.6)

## 2010-10-09 ENCOUNTER — Emergency Department (HOSPITAL_COMMUNITY)
Admission: EM | Admit: 2010-10-09 | Discharge: 2010-10-09 | Disposition: A | Discharge status: Other Acute Inpatient Hospital | Payer: Medicaid Other | Source: Home / Self Care | Attending: Emergency Medicine | Admitting: Emergency Medicine

## 2010-10-09 ENCOUNTER — Inpatient Hospital Stay (HOSPITAL_COMMUNITY)
Admission: RE | Admit: 2010-10-09 | Discharge: 2010-10-12 | DRG: 897 | Disposition: A | Discharge status: Home / Self Care | Payer: Medicaid Other | Source: Ambulatory Visit | Attending: Psychiatry | Admitting: Psychiatry

## 2010-10-09 DIAGNOSIS — F1020 Alcohol dependence, uncomplicated: Principal | ICD-10-CM

## 2010-10-09 DIAGNOSIS — R45851 Suicidal ideations: Secondary | ICD-10-CM | POA: Insufficient documentation

## 2010-10-09 DIAGNOSIS — F319 Bipolar disorder, unspecified: Secondary | ICD-10-CM

## 2010-10-09 DIAGNOSIS — I25810 Atherosclerosis of coronary artery bypass graft(s) without angina pectoris: Secondary | ICD-10-CM | POA: Insufficient documentation

## 2010-10-09 DIAGNOSIS — IMO0002 Reserved for concepts with insufficient information to code with codable children: Secondary | ICD-10-CM

## 2010-10-09 DIAGNOSIS — F329 Major depressive disorder, single episode, unspecified: Secondary | ICD-10-CM | POA: Insufficient documentation

## 2010-10-09 DIAGNOSIS — K219 Gastro-esophageal reflux disease without esophagitis: Secondary | ICD-10-CM

## 2010-10-09 DIAGNOSIS — F1910 Other psychoactive substance abuse, uncomplicated: Secondary | ICD-10-CM

## 2010-10-09 DIAGNOSIS — F39 Unspecified mood [affective] disorder: Secondary | ICD-10-CM

## 2010-10-09 DIAGNOSIS — F3289 Other specified depressive episodes: Secondary | ICD-10-CM | POA: Insufficient documentation

## 2010-10-09 DIAGNOSIS — Z046 Encounter for general psychiatric examination, requested by authority: Secondary | ICD-10-CM | POA: Insufficient documentation

## 2010-10-09 LAB — URINALYSIS, ROUTINE W REFLEX MICROSCOPIC
Glucose, UA: NEGATIVE mg/dL
Hgb urine dipstick: NEGATIVE
Ketones, ur: NEGATIVE mg/dL
Protein, ur: NEGATIVE mg/dL

## 2010-10-09 LAB — CBC
MCH: 30.7 pg (ref 26.0–34.0)
MCHC: 35 g/dL (ref 30.0–36.0)
MCV: 87.7 fL (ref 78.0–100.0)
Platelets: 221 10*3/uL (ref 150–400)
RDW: 13.4 % (ref 11.5–15.5)

## 2010-10-09 LAB — BASIC METABOLIC PANEL
BUN: 8 mg/dL (ref 6–23)
Chloride: 101 mEq/L (ref 96–112)
Glucose, Bld: 93 mg/dL (ref 70–99)
Potassium: 3.5 mEq/L (ref 3.5–5.1)

## 2010-10-09 LAB — DIFFERENTIAL
Eosinophils Absolute: 0.1 10*3/uL (ref 0.0–0.7)
Eosinophils Relative: 1 % (ref 0–5)
Lymphs Abs: 2.3 10*3/uL (ref 0.7–4.0)
Monocytes Relative: 9 % (ref 3–12)

## 2010-10-09 LAB — ETHANOL: Alcohol, Ethyl (B): 232 mg/dL — ABNORMAL HIGH (ref 0–10)

## 2010-10-10 DIAGNOSIS — F1020 Alcohol dependence, uncomplicated: Secondary | ICD-10-CM

## 2010-10-10 LAB — HEPATIC FUNCTION PANEL
ALT: 19 U/L (ref 0–53)
AST: 26 U/L (ref 0–37)
Alkaline Phosphatase: 78 U/L (ref 39–117)
Indirect Bilirubin: 0.2 mg/dL — ABNORMAL LOW (ref 0.3–0.9)
Total Protein: 6.6 g/dL (ref 6.0–8.3)

## 2010-10-13 NOTE — Discharge Summary (Signed)
  NAME:  [name] [name], [name] [name] NO.:  [id number]  MEDICAL RECORD NO.:  [id number]           PATIENT TYPE:  I  LOCATION:  0306                          FACILITY:  [facility]  PHYSICIAN:  Anselm Jungling, MD  DATE OF BIRTH:  1965/07/04  DATE OF ADMISSION:  10/09/2010 DATE OF DISCHARGE:  10/12/2010                              DISCHARGE SUMMARY   IDENTIFYING DATA/REASON FOR ADMISSION:  This was the third [facility] inpatient admission for [name] [name], a 45 year old single African American male who was admitted for alcohol dependence and substance abuse.  Please refer to the admission note for further details pertaining to the symptoms, circumstances, and history that led to his hospitalization.  He was given an initial Axis I diagnosis of alcohol dependence.  MEDICAL AND LABORATORY:  The patient was medically and physically assessed by the psychiatric nurse practitioner.  He was in generally good health without any active or chronic medical problems apart from his need for medical detoxification from alcohol.  He was treated with Protonix 40 mg daily for symptoms of GERD.  There were no other significant medical issues.  HOSPITAL COURSE:  The patient was admitted to the adult inpatient psychiatric service.  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  He appeared older than his chronological age.  He was generally pleasant and friendly and good natured.  He appeared to be of average intelligence but perhaps limited education.  He participated in therapeutic groups and activities and was a fairly good participant.  He was cooperative at all times.  There was no suicidal ideation at any time.  Suicide risk assessments were completed on admission and discharge, and he was rated at minimal risk in all cases.  He was placed on a Librium withdrawal protocol.  His  detoxification was uneventful.  He was also continued on his usual regimen of Seroquel and Celexa.  Mood was stable during his stay.  The patient was deemed appropriate for discharge on the fourth hospital day, and at that time he also requested discharge.  He indicated that he was not sure whether he would remain in this area or travel to [place] where he could stay with his parents, who he described as very supportive.  At the time of this dictation, firm aftercare plans were not decided upon because of these considerations.  DISCHARGE MEDICATIONS:  Celexa 20 mg daily, Seroquel 100 mg q.h.s., Protonix 40 mg daily.  DISCHARGE DIAGNOSES:  AXIS I:  Alcohol dependence, polysubstance abuse. Mood disorder, not otherwise specified. AXIS II:  Deferred. AXIS III:  Gastroesophageal reflux disease, possible fetal alcohol syndrome/effects. AXIS IV:  Stressors, severe. AXIS V: GAF on discharge 50.     Anselm Jungling, MD     SPB/MEDQ  D:  10/12/2010  T:  10/13/2010  Job:  [id number]  Electronically Signed by [name] Flash MD on 10/13/2010 10:51:26 AM

## 2010-10-20 NOTE — Discharge Summary (Signed)
NAME:  [name] [name], [name] [name] NO.:  [id number]   MEDICAL RECORD NO.:  [id number]          PATIENT TYPE:  IPS   LOCATION:  0402                          FACILITY:  [facility]   PHYSICIAN:  Anselm Jungling, MD  DATE OF BIRTH:  1966-02-20   DATE OF ADMISSION:  03/23/2008  DATE OF DISCHARGE:  03/27/2008                               DISCHARGE SUMMARY   IDENTIFYING DATA AND REASON FOR ADMISSION:  The patient is a 45 year old  single African American male who was admitted due to increasing problems  with chronic substance abuse associated with psychotic features.  Please  refer to the admission note for further details pertaining to the  symptoms, circumstances, and history that led to his hospitalization.  He was given an initial Axis I diagnosis of psychosis, NOS, and  polysubstance abuse, NOS.   MEDICAL AND LABORATORY:  The patient was medically and physically  assessed by the psychiatric nurse practitioner.  He was in good health  without any active or chronic medical problems with the exception of  GERD which was addressed with Protonix 40 mg daily.   HOSPITAL COURSE:  The patient was admitted to the adult inpatient  psychiatric service.  He presented as a slender but normally-developed  African American male who appeared to be in early alcohol withdrawal.  He was alert and fully oriented.  He was pleasant on approach.  Denied  suicidal ideation and verbalized a strong desire for help.  There were  no signs or symptoms of psychosis evident during his stay.   Although he tended to withdraw to his room, his eating and sleeping were  stable and he tolerated medication well.  He worked with the care  Production designer, theatre/television/film regarding discharge planning.  He reported that he had been in  [facility] in the past and would be open to going back there  but ultimately he was discharged to a family member who agreed to take  him.  He also talked about starting vocational training at [employer].  We also considered [facility] for him but he eventually  chose to go with a family member.   He completed his alcohol withdrawal uneventfully and was continued on a  regimen of Seroquel 200 mg q.h.s. which he had been on previously and  which appeared to be keeping any psychotic symptoms at bay.   On the 5th hospital day, he appeared appropriate for discharge.  He  agreed to following aftercare plan.   AFTERCARE:  Patient was to follow up at the [facility] with an  appointment to see their psychiatrist on April 02, 2008.   DISCHARGE MEDICATIONS:  1. Seroquel 200 mg q.h.s.  2. Protonix 40 mg daily.   DISCHARGE DIAGNOSES:  AXIS I:  1. Alcohol dependence, early remission.  2. Psychosis, not otherwise specified, rule out substance-induced      psychosis.  AXIS II:  Deferred.  AXIS III:  Gastroesophageal reflux disease.  AXIS IV:  Stressors, severe.  AXIS V:  Global Assessment of Functioning on discharge 55.      Viviann Spare P.  Electa Sniff, MD  Electronically Signed     SPB/MEDQ  D:  03/28/2008  T:  03/28/2008  Job:  [id number]
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
## 2010-10-20 NOTE — Discharge Summary (Signed)
NAME:  [name] [name], [name] [name] NO.:  [id number]   MEDICAL RECORD NO.:  [id number]          PATIENT TYPE:  INP   LOCATION:  4729                         FACILITY:  [facility]   PHYSICIAN:  Bevelyn Buckles. Bensimhon, MDDATE OF BIRTH:  [birth year]   DATE OF ADMISSION:  11/20/2007  DATE OF DISCHARGE:  11/21/2007                               DISCHARGE SUMMARY   PRIMARY CARDIOLOGIST:  Bevelyn Buckles. Bensimhon, MD.   DISCHARGE DIAGNOSIS:  Chest pain.   SECONDARY DIAGNOSES:  1. History of gastroesophageal reflux disease.  2. Marijuana abuse.  3. Remote crack cocaine abuse.  4. Asymptomatic bradycardia.  5. Tobacco abuse.   ALLERGIES:  No known drug allergies.   PROCEDURE:  Left heart cardiac catheterization.   HISTORY OF PRESENT ILLNESS:  A 45 year old African American male without  prior history of coronary disease.  He had a history of chest pain  dating back several years, which has been worse and constant over the  past 2 weeks. He presented to the [facility] ED on November 20, 2007, and  was admitted for further evaluation.   HOSPITAL COURSE:  The patient ruled out for MI, but continued to have  chest pain.  His urine drug screen is positive for THC.  Decision was  made to proceed with left heart cardiac catheterization, rule out  coronary artery obstruction.  Catheterization was performed on November 20, 2007, showing 60% stenosis in the mid-to-distal LAD, with otherwise  normal coronary artery.  EF was 55% without any regional wall motion  abnormalities.  We have initiated aspirin and statin therapy and [name] [name] will be discharged to home today in good condition.   DISCHARGE LABORATORY DATA:  Hemoglobin 13.1, hematocrit 37.7, WBC 4.5,  platelets 146, INR 1.1.  Sodium 140, potassium 3.8, chloride 108, CO2 of  28, BUN 8, creatinine 1.15, glucose 85, total bilirubin 1.0, alkaline  phosphatase 69, AST 25, ALT 17, total protein 6.9, albumin 3.4, calcium  8.5, amylase 63, lipase 30, CK  281, MB 1.7, troponin I 0.01, total  cholesterol 164, triglycerides 58, HDL 77, LDL 75.  Urine drug screen  positive for opiates and THC.   DISPOSITION:  The patient has been discharged to home today in good  condition.   FOLLOWUP PLANS AND APPOINTMENT:  The patient has obtained a primary care  [name] [name] and only needs cardiology followup on an as needed basis.   DISCHARGE MEDICATIONS:  1. Aspirin 81 mg daily.  2. Lovastatin 20 mg  nightly.  3. Prilosec OTC 20 mg daily.   OUTSTANDING LAB STUDIES:  None.   DURATION OF DISCHARGE ENCOUNTER:  A 45 minutes including physician time.      [name] [name], ANP      Bevelyn Buckles. Bensimhon, MD  Electronically Signed    CB/MEDQ  D:  11/21/2007  T:  11/22/2007  Job:  [id number]

## 2010-10-20 NOTE — Discharge Summary (Signed)
NAME:  [name] [name], [name] [name] NO.:  [id number]   MEDICAL RECORD NO.:  [id number]          PATIENT TYPE:  INP   LOCATION:  6715                         FACILITY:  [facility]   PHYSICIAN:  Paula Compton, MD        DATE OF BIRTH:  30-Nov-1965   DATE OF ADMISSION:  02/18/2008  DATE OF DISCHARGE:  02/19/2008                               DISCHARGE SUMMARY   CONSULT DURING ADMISSION:  None.   ADMISSION DIAGNOSES:  1. Chest pain.  2. Gastroesophageal reflux disease.  3. Hypertension.  4. Coronary artery disease.  5. Asymptomatic bradycardia.  6. Cocaine abuse.  7. Dyslipidemia.  8. Marijuana abuse.   DIAGNOSES AT DISCHARGE:  1. Chest pain related to gastroesophageal reflux disease.  2. Hypertension.  3. Coronary artery disease.  4. Asymptomatic bradycardia.  5. Cocaine abuse.  6. Dyslipidemia.  7. Marijuana abuse.   HISTORY BRIEFLY:  This 45 year old male with a history of substance  abuse, status post cardiac catheterization in June 2009 for chest pain  and found to have noncardiac chest pain, presents to the ED with  complaints of chest pain for 3 days.  The chest pain is described as a  heavy feeling across the entire chest, nonradiating, no aggravating  factors and was mostly constant, but was occasionally intermittent and  been progressively worsening.  There was minimum alleviation of  discomfort with warm water not alleviated by aspirin or nitroglycerin,  but was significantly improved with morphine and Dilaudid in the ED.  The chest pain was associated with nausea.  However, the patient denies  shortness of breath, diaphoresis, emesis, headaches, or nausea and no  association of chest pain with p.o. intake.   BRIEF HOSPITAL COURSE:  The patient was admitted to telemetry.   ADMISSION LABORATORY DATA:  Urine drug screen is positive for opiates  and THC.  Amylase was 94.  Chest x-ray was negative.  Cardiac enzymes  were negative x3 and EKG was unchanged from  previous one in June 2009.  On discharge, the patient was asymptomatic, ambulating, eating and able  to void without any difficulties.  Labs at discharge were CBC showed a  white count of 4.3, hemoglobin of 13.2, hematocrit of 38.9, and  platelets of 142.  BMET showed a sodium of 135, potassium 3.5, chloride  104, bicarb at 26, BUN of 9, creatinine 1.14 and glucose of 104.  Liver  function tests were within normal limits.   DISCHARGE CONDITION:  Stable and improved.   DISPOSITION:  The patient will be discharged to his cousin's house.   MEDICATIONS:  The patient will be sent out on,  1. Prilosec 40 mg p.o. daily.  2. Lovastatin 20 mg p.o. daily.  3. Aspirin 81 mg p.o. daily.  4. Tylenol 650 mg p.o. q.6h. p.r.n. pain and the patient will have      [facility] helping him to give these medications.  He is also      instructed to call [facility] on [facility], their number      is [phone], and he should make an appointment within  1-2 weeks.      The patient can eat a low-sodium, heart-healthy diet and there are      no restrictions to activity.      Rodney Langton, MD  Electronically Signed      Paula Compton, MD  Electronically Signed    TT/MEDQ  D:  02/19/2008  T:  02/20/2008  Job:  [id number]

## 2010-10-20 NOTE — H&P (Signed)
NAME:  [name] [name], RIVENBURG NO.:  [id number]   MEDICAL RECORD NO.:  [id number]          PATIENT TYPE:  INP   LOCATION:  4729                         FACILITY:  [facility]   PHYSICIAN:  Reginia Forts, MD     DATE OF BIRTH:  [birth year]   DATE OF ADMISSION:  11/20/2007  DATE OF DISCHARGE:                              HISTORY & PHYSICAL   CHIEF COMPLAINT:  Chest pain.   HISTORY OF PRESENT ILLNESS:  [name] is a 45 year old African  American male with history of cocaine abuse and recurrent chest pain,  presents with another recurrence of anterior chest pain.  The patient  has had multiple emergency room visits for chest pain and has been  discharged during all of them with presumed diagnosis of  gastroesophageal reflux gastritis.  He has been prescribed Prilosec but  intermittently takes it.  He states that over the last month his  symptoms have recurred and have gotten worse compared to his prior  month, despite avoiding foods that he was told might aggravate  gastritis.  He did miss his doses of Prilosec and states that they have  only intermittently helped.  He now complains of sharp chest pain across  the anterior chest with no associated shortness of breath, dyspnea on  exertion.  Pain lasts for hours at a time when it occurs.  In the  emergency room, the patient received aspirin and 4 mg of morphine with  improvement in his symptoms.  The concern by the  ED team was new tall R  waves in V-2 with no lead misplacement and bradycardia.  Review of the  chart demonstrated asymptomatic bradycardia in the past dating back to  July 22, 2007.  The patient denies any current crack use; UDS is  pending.   ALLERGIES:  No known drug allergies.   PAST MEDICAL HISTORY:  Presumed GERD.   MEDICATIONS:  None.   SOCIAL HISTORY:  The patient lives in a shelter, he is homeless.  He has  occasional marijuana use, occasional alcohol, and history of cocaine  use.   FAMILY  HISTORY:  No known coronary artery disease.   REVIEW OF SYSTEMS:  Notable for chest pain and presumed GERD symptoms as  above.  Rest of 12-reveiw of systems was reviewed and is negative.   PHYSICAL EXAMINATION:  VITAL SIGNS:  Temperature 97.8, pulse 50,  respiratory rate 18, blood pressure 114/56.  GENERAL:  The patient is in mild distress secondary to chest pain.  He  is awake, alert, oriented x3.  HEENT:  Normocephalic, atraumatic.  Pupils equal, round and reactive to  light.  Extraocular movements intact.  NECK:  No JVD or carotid bruits.  LUNGS:  Huston Foley, regular rhythm.  Normal S1, S2.  No murmurs, rubs or  gallops.  Clear to auscultation bilaterally.  SKIN:  No rash or lesions.  ABDOMEN:  Positive bowel sounds, mild tenderness in the epigastric  region.  GU:  Deferred.  EXTREMITIES:  No cyanosis, clubbing or edema.  There are 2+ femoral  pulses, 2+ distal pulses.  NEUROLOGICAL:  Cranial nerves II-XII  grossly intact.  No focal  musculoskeletal or sensory deficits.  MUSCULOSKELETAL:  No joint effusions or tenderness.  LYMPH:  No lymphadenopathy.  PSYCH:  Normal affect.   STUDIES:  Chest x-ray demonstrates no acute cardiopulmonary process.  EKG demonstrates sinus bradycardia with a rate of 49 with repolarization  changes and no Q waves.  There is an elevated R wave in V2 which appears  out of place when following the transition of the R waves from V1 to V4.  Hemoglobin 15.6, potassium 3.5, BUN 14, creatinine 1.4, CK 105, MB 2.6,  troponin less than 0.05.   ASSESSMENT/PLAN:  This is a 45 year old African American male with  worsening recurrence, atypical chest pain.  EKG findings did not  demonstrate anything to suggest an acute process going on.  Review of  vitals in the past demonstrates history of bradycardia for which the  patient has not had any symptoms per emergency room notes or current  history of any symptomatic bradycardia.  The patient will be ruled out.  We will  consider an inpatient stress test.  No beta blockers will be  given due to his current bradycardia and pending urine drug screen.  The  patient will be restarted on Prilosec.  Amylase, lipase will be  obtained.  Please note, for future reference the patient shall not be  labeled as a [place] patient.      Reginia Forts, MD  Electronically Signed     RA/MEDQ  D:  11/20/2007  T:  11/20/2007  Job:  [id number]

## 2010-10-20 NOTE — H&P (Signed)
NAME:  [name] [name], [name] [name] NO.:  [id number]   MEDICAL RECORD NO.:  [id number]          PATIENT TYPE:  INP   LOCATION:  6715                         FACILITY:  [facility]   PHYSICIAN:  Paula Compton, MD        DATE OF BIRTH:  1966/01/02   DATE OF ADMISSION:  02/18/2008  DATE OF DISCHARGE:  02/19/2008                              HISTORY & PHYSICAL   CHIEF COMPLAINT:  Chest pain.   HISTORY OF PRESENT ILLNESS:  A 45 year old male with a history of  substance abuse, status post cardiac catheterization in June 2009 for  chest pain, found to have noncardiac chest pain, presents to ED with  complaint of chest pain x3 days.  Chest pain described as heaviness  feeling across his entire chest, nonradiating.  No aggravating factors.  It is mostly constant but is occasionally intermittent; however, has  been progressively worsening over past 3 days.  Minimal alleviation of  discomfort with warm water per the patient, not alleviated by aspirin or  sublingual nitroglycerin; however, relieved by morphine/Dilaudid in the  emergency department.  Chest pain is associated with nausea.  Denies  shortness of breath, diaphoresis, emesis, headache, or dizziness.  No  association of  chest pain with p.o. intake.   PAST MEDICAL HISTORY:  1. GERD.  2. Hypertension.  3. Coronary artery disease.  4. Asymptomatic bradycardia.  5. Cocaine abuse.  6. Dyslipidemia.  7. Marijuana abuse.   PAST SURGICAL HISTORY:  Cardiac catheterization in June 2009, ejection  fraction of 55%, and 60% occluded LAD.   MEDICATIONS:  The patient states he is not taking meds on regular basis.  Prescribed medications at discharge in June 2009:  1. Aspirin 81 mg daily.  2. Lovastatin 20 mg nightly.  3. Prilosec 20 mg daily.  4. Unknown psychiatric medication.   ALLERGIES:  No known drug allergies.   FAMILY HISTORY:  Sister with heart murmur.   SOCIAL HISTORY:  History of tobacco use, currently 6 cigarettes per  day.  Positive illicit drug use, marijuana.  Last cocaine use 1 week ago with  marijuana laced with cocaine.  EtOH, 3-4 twelve-ounce beers daily and 12  ounces of hard liquor greater than 3 times per week.   REVIEW OF SYSTEMS:  No recent illness.  No URI symptoms.  Positive  nausea, positive constipation, and occasional headache.   PHYSICAL EXAMINATION:  VITAL SIGNS:  Temperature 97.1; pulse 44, range  44-54; BP 120/68, range systolic 108-120, diastolic 68-79; respiratory  rate 24, range 18-36; and O2 sat 100% on room air.  GENERAL:  In no acute distress.  Alert and oriented x3.  HEENT:  PERRL.  Nonicteric.  Pink conjunctivae.  EOMI.  Oropharynx  clear.  Moist mucous membranes.  CVS:  Bradycardia.  No murmurs.  Regular rhythm.  RESPIRATORY:  CTAB.  Some chest pain with inspiration.  ABDOMEN:  Positive bowel sounds, soft, nondistended, and nontender.  No  masses.  EXTREMITIES:  Pulses 2+.  No edema.  No cyanosis.  NEUROLOGIC:  Cranial nerves II through XII grossly intact.  No focal  deficits.  LABORATORY DATA:  UDS, positive opiates and positive THC.  Amylase 94,  within normal limits.  I-STAT:  Sodium 141, potassium 3.9, chloride 106,  bicarb 28, BUN 10, creatinine 1.3, and glucose 64.  Hemoglobin 15.3 and  hematocrit 45.0.  Point-of-care negative x1.   CHEST X-RAY:  1. Bronchitic changes.  2. No pneumonia seen.   EKG:  Sinus bradycardia.   ED COURSE:  Given aspirin 325 mg x1 day, morphine 4 mg IV x1 day,  Dilaudid 1 mg IV x1 day, and nitroglycerine given by EMS.   ASSESSMENT AND PLAN:  A 45 year old male admitted with atypical chest  pain.  1. Chest pain:  Chest pain is described as atypical due to persistent      pain, not relieved by aspirin or nitroglycerin, most likely      noncardiac; however, because the patient has history of cocaine use      and is status post cardiac cath, we will admit and rule out cardiac      ischemia with serial cardiac enzymes x2 and repeat  EKG in a.m.      Other differentials include GERD, history of substance abuse      including EtOH abuse, and the patient not taking recently      prescribed PPI since discharge in June 2009.  We will restart      aspirin 325 and proton pump inhibitor Protonix 40 mg p.o. daily.      We will obtain TSH, CBC, CMET in a.m. as well as cardiac enzymes.      Morphine p.r.n. chest pain x24 hours.  2. Dyslipidemia:  Start lovastatin 20 mg p.o. daily.  3. Substance abuse:  Smoking cessation consult.  The patient refuses      nicotine patch during inpatient stay.  4. Asymptomatic bradycardia:  Blood pressure stable, and currently,      the patient is asymptomatic on bradycardia.      We will not start any antihypertensives or beta-blockers currently      as not to cause severe bradycardia.  5. Deep vein thrombosis/gastrointestinal prophylaxis:  Lovenox 40 mg      subcu daily and proton pump inhibitor.  6. Disposition:  Pending cardiac evaluation and improvement in chest      pain.      Milinda Antis, MD  Electronically Signed      Paula Compton, MD  Electronically Signed    KD/MEDQ  D:  02/19/2008  T:  02/19/2008  Job:  [id number]

## 2010-10-20 NOTE — Discharge Summary (Signed)
NAME:  [name] [name], [name] [name] NO.:  [id number]   MEDICAL RECORD NO.:  [id number]          PATIENT TYPE:  INP   LOCATION:  3709                         FACILITY:  [facility]   PHYSICIAN:  Cliffton Asters, M.D.    DATE OF BIRTH:  1966/05/23   DATE OF ADMISSION:  06/10/2008  DATE OF DISCHARGE:  06/11/2008                               DISCHARGE SUMMARY   DISCHARGE DIAGNOSES:  1. Chest pain likely secondary to gastritis or gastroesophageal reflux      disease or cocaine abuse.  2. Polysubstance abuse including cocaine, tobacco, and alcohol.  3. Hyperlipidemia.  4. Hypertension.  5. Gastroesophageal reflux disease.  6. Depression.  7. Coronary artery disease.   DISCHARGE MEDICATIONS:  1. Aspirin 81 mg p.o. daily.  2. Seroquel 200 mg p.o. nightly.  3. Lovastatin 20 mg p.o. daily.  4. Omeprazole 40 mg p.o. daily.  5. Maalox Regular Strength, 1 tablet p.o. p.r.n. for heart burning.   DISPOSITION AND FOLLOWUP:  [name] [name] has been discharged from the  hospital on June 11, 2008 in stable and improved condition, his chest  pain and epigastric pain is much better, he will have an appointment  with Dr. Reche Dixon of [facility] on July 04, 2008 at 10:45 a.m.  At  that time, he will follow up with his epigastric pain and chest pain,  and also provide conseling on drug, tobacco and alcohol abuse. Check his  blood pressure.   CONSULTATION:  None.   PROCEDURE PERFORMED:  Chest x-ray on June 10, 2008, did not show acute  cardiopulmonary findings.   ADMISSION HISTORY:  [name] [name] is a 45 year old African American male  with past medical history of coronary artery disease, hypertension,  hyperlipidemia, GER disease, polysubstance abuse including cocaine,  tobacco, and alcohol.  He presents to the [facility] Emergency  Department with complaint of chest pain.  The chest pain started about 4  days prior to admission.  It is constant, but wax and wane.  The  severity is 10/10 and it  was a burning and stabbing sensation located at  epigastric and substernal chest, it is also associated with shortness of  breath, diaphoresis, he has nausea, but no vomiting.  The symptoms are  worse at night when he lies down and can be relieved with glass of milk  or drinking some hot water.  The patient has been taking some Advil for  the pain, but did not relieve and the patient also reports that he had  stressful holidays and reports the use of alcohol, cocaine, and  marijuana on New Year's Eve.  He denies any palpitations, fever, cough,  vomiting, diarrhea and he had similar pain when he had the reflux  disease.  For further evaluation, he came to the ED.   ADMISSION PHYSICAL EXAMINATION:  VITAL SIGNS:  Temperature 97.4, blood  pressure 178/81, pulse 73, and respiratory rate 20, O2 sats 99 on room  air.  GENERAL:  The patient is in no acute distress.  EYES:  Extraocular movement intact.  ENT:  Poor dentition with missing teeth.  No erythema.  NECK:  Supple.  No JVD.  LUNGS:  Clear to auscultation bilaterally.  HEART:  Regular rate and rhythm.  No murmur.  ABDOMEN:  Soft.  No tenderness.  No distention.  Bowel sounds positive.  NEUROLOGIC:  No focal neurological findings.   ADMISSION LABORATORY DATA:  Sodium 136, potassium is 3.8, chloride 104,  bicarb 26, BUN 16, creatinine 1.3, glucose 96.  White blood cells 5.1,  hemoglobin 12.8, platelets 150, lipase 32.  Cardiac enzymes negative x3.  UDS positive for barbiturates, cocaine, opiate, and marijuana.  Alcohol  level less than 5, TSH 0.5, and A1c is 5.   HOSPITAL COURSE:  1. Chest pain likely secondary to gastritis or GERD disease or cocaine      abuse.  The patient is a 45 year old male with several days of      chest pain, but most likely in the epigastric and substernal area      and not relieved by Advil and he had the stressful holidays with      heavy use of alcohol, marijuana, cocaine, but the pain was relieved       with milk and by drinking hot water and the patient had run out the      Protonix and Seroquel about a month ago.  Because the patient has      risk factor of coronary artery disease, hypertension, dyslipidemia,      and polysubstance abuse, so we admitted the patient to the hospital      to rule out an acute coronary syndrome.  We cycled the cardiac      enzymes and did EKG,  which were all negative and his telemetry did      not show any abnormal finding.  Once we gave the patient Protonix,      the patient's pain was relieved significantly, so we thought that      the patient's chest pain is mostly likely due to the gastritis and      GERD disease, or may be due to cocaine abuse.  It is less likely he      has coronary artery disease or pancreatitis because there is no      evidence to support for that.  The patient was discharged in      improved condition.  The patient will continue to take omeprazole      and also gave him some antacids and he needs his polysubstance      abuse counseling at next visit.  2. Hyperlipidemia.  The patient will continue to take his home      medication of lovastatin and his fasting lipid panel was normal.  3. Polysubstance abuse.  The patient has alcohol, smoking, cocaine,      and marijuana abuse.  We have provided counseling and the patient      would like to quit. But patient has history of relapse, the patient      needs further counseling for quitting these substances.   DISCHARGE VITALS:  Temperature 97.2, blood pressure 107/65, pulse 55,  respiration rate 18, O2 sats 99 on 2l.   DISCHARGE LABORATORY DATA:  White blood cells 4.4, hemoglobin of 13.2,  platelets 145.  Sodium 140, potassium 4, chloride 107, bicarb 28, BUN  11, creatinine 1.12, and glucose 98.      Jackson Latino, MD  Electronically Signed      Cliffton Asters, M.D.  Electronically Signed    ZY/MEDQ  D:  06/29/2008  T:  06/30/2008  Job:  [id number]

## 2010-10-20 NOTE — Discharge Summary (Signed)
NAME:  [name] [name], [name] [name] NO.:  [id number]   MEDICAL RECORD NO.:  [id number]          PATIENT TYPE:  INP   LOCATION:  3715                         FACILITY:  [facility]   PHYSICIAN:  Acey Lav, MD  DATE OF BIRTH:  26-Sep-1965   DATE OF ADMISSION:  01/20/2009  DATE OF DISCHARGE:  01/21/2009                               DISCHARGE SUMMARY   DISCHARGE DIAGNOSES:  1. Gastroesophageal reflux disease/peptic ulcer disease.  2. Anemia.  3. Hyperlipidemia.  4. Depression.  5. Alcohol abuse.  6. Tobacco abuse.  7. Polysubstance abuse.  8. Hypokalemia.  9. CAD   DISCHARGE MEDICATIONS:  With accurate doses:  1. Aspirin 325 mg orally every day.  2. Protonix 40 mg orally twice daily.  3. Seroquel 200 mg by mouth every night.  4. Thiamine 100 mg by mouth every day.  5. Folic acid 1 mg tablet by mouth every day.  6. Maalox 1-2 tablets as needed every 6 hours for abdominal pain.  7. Zocor 40 mg orally by mouth.  8. Neurontin 300 mg three times a day by mouth.   DISPOSITION AND FOLLOWUP:  The patient was discharged home with a  followup appointment with Dr. Donia Guiles on February 20, 2009 at 3  p.m. Patient was advised to call up the [facility] and schedule an  earlier appointment and advised to schedule him for endoscopic  gastroduodenoscopy for a possibility of a peptic ulcer disease and also  to maintain medication compliance.   PROCEDURES PERFORMED ON HIM:  The chest x-ray done on January 20, 2009,  which was not suggestive of any acute pathology.  No consultations were  done on him.   ADMITTING HISTORY AND PHYSICAL:  [name] is a 45 year old man with  past medical history of CAD 60% mid left anterior descending artery  diagnosed in January 2010 with ongoing tobacco abuse, cocaine abuse,  hypertension, GERD, and manic depression.  He presented to the emergency  department via EMS with 3 days history of non-remitting chest pain.  The  pain started 3 days ago  in the centre of his chest after walking for 2  miles.  It was gradual in onset, persistent, and progressively getting  worse.  There were no aggravating factors, but it got relieved by hot  water.  There was no radiation and the chest pain was described as  tearing kind of pain as if somebody is stabbing in his chest.  It was  not associated with sweating, shortness of breath, or vomiting, but  there was some nausea associated with the pain.  He also complained of  similar episodes of pain in the past but less severe and he also said  that he is afraid of eating food because sometimes the pain used to be  brought up by eating food.  He did not complain of any hematochezia,  hematemesis, black-colored stool.  He does not complain of any cough,  any leg pain, or swelling.  During his last admission in January 2010, a  catheterization was done on him which showed a 60% coronary artery  disease, but he has not been taking any medications.  He is not  compliant with his medications for coronary artery disease.  He is just  taking his medications for depression, that is Neurontin and Seroquel  but he has not been taking those medications either for last 2 weeks now  due to [employer].   PHYSICAL EXAMINATION:  VITAL SIGNS:  At the time of admission,  temperature 97.7, blood pressure 109/63, pulse 57, respiratory rate 20,  and oxygen saturation 100% on 2 L.  GENERAL:  He was in mild to moderate distress from pain.  EYES:  Pupils equally round and reactive to light.  Extraocular  movements intact, anicteric.  NOSE:  Moist and pink mucosa.  NECK:  Supple.  No JVD or bruits.  RESPIRATORY:  Clear to auscultation bilaterally with good air entry.  CARDIOVASCULAR:  S1 and S2 normal.  Regular rhythmic rate.  No murmur,  rubs, or gallops.  GASTROINTESTINAL:  Bowel sounds were normal in all 4 quadrants plus  soft, nontender, and nondistended, but there was a mild epigastric pain  present just below  the xiphoid process.  There was no rebound tenderness  or rigidity or guarding.  EXTREMITIES:  No calf tenderness.  No edema, and he was moving all 4  limbs equally well.  NEUROLOGIC:  He was alert and oriented x4 and he was walking normally.  His gait was normal.   HOSPITAL COURSE BY PROBLEM:  1. Chest pain/epigastric pain.  We did work up for a possible coronary      artery disease, but his cardiac enzymes were negative x3.  He was      put on morphine, Maalox, sublingual nitro p.r.n. and aspirin for      pain, Pain used to get better on GI cocktail, pain improved      significantly during the hospital stay and by the time of discharge      he was pain free.  Whenever he experienced pain, GI cocktail was      the best remedy that will work for him.  We also assessed him for a      possible H. pylori infection and the H. pylori antibodies IgG of      his blood came out positive, but we were not able to put him on any      therapeutic treatment because he was discharged before the arrival      of the test results.  His pancreatic enzymes, lipase, and amylase      were negative.  So, we did not pursue pancreatitis.  His echo was      very recent, so  we did not repeat it as the pain was most likely      gastric in origin.  2. For GERD, we started him on proton pump inhibitors, Protonix, and      he seemed to improve.  3. For his anemia, we did an anemia panel, which was suggestive anemia      of chronic disease.  We tried to do a guaiac of his stools, but he      did not pass any stools during the hospital admission, so we were      not able to do that, but we started him on folate.  For his anemia,      his hemoglobin at the time of admission was 12.6, which remained      stable during the hospital stay.  4. For his hyperlipidemia,  we started him on Zocor 40 mg and lipid      panel was done, which was suggestive of good cholesterol control.  5. For depression, we continued his home  meds, that is Seroquel and      Neurontin.  6. For his alcohol abuse, he was put on CIWA protocol and we started      on thiamine and folate, but he did not complain of any tremors or      any withdrawal symptoms during the hospital course.  7. For his tobacco abuse, we started a nicotine patch on him, but he      was not seen by a smoking cessation counsel expert because of his      refusal.  8. For his other substance abuse, social worker was contacted and he      agreed that he will not be pursuing those things in future.  9. For his hypokalemia, which was 3.3 at the time of admission with K,      we repleted it with some oral potassium and we did a magnesium      study which was normal.   DISCHARGE LABS AND VITALS:  On the day of his discharge, his labs, WBC  4.3, RBC 3.94, hemoglobin 12.6, hematocrit 37, MCV 93.8, MCHC 34.0, RDW  14.2, and platelets 132.  BMET was sodium 142, potassium 4.3, chloride  110, CO2 27, BUN 3, creatinine 1.05, glucose 91, and calcium 8.8.  His  lipid profile, total cholesterol 148, triglycerides 66, HDL 62, LDL 73,  VLDL 13, and total cholesterol/HDL ratio 2.4.  His H. pylori antibody  IgG was  7.0, which was high, so it was positive.  Serum folate was 690, it was  high.  On the day of discharge, his vitals, temperature 97.9, pulse 56,  respirations 20 per minute, systolic blood pressure 103, diastolic blood  pressure 68, and his oxygen saturation 98% on room air.      Lars Mage, MD  Electronically Signed      Acey Lav, MD  Electronically Signed    AG/MEDQ  D:  01/22/2009  T:  01/23/2009  Job:  [id number]   cc:   Dineen Kid. Reche Dixon, M.D.

## 2010-11-22 ENCOUNTER — Emergency Department (HOSPITAL_COMMUNITY): Payer: Medicaid Other

## 2010-11-22 ENCOUNTER — Emergency Department (HOSPITAL_COMMUNITY)
Admission: EM | Admit: 2010-11-22 | Discharge: 2010-11-22 | Disposition: A | Discharge status: Home / Self Care | Payer: Medicaid Other | Attending: Emergency Medicine | Admitting: Emergency Medicine

## 2010-11-22 DIAGNOSIS — F319 Bipolar disorder, unspecified: Secondary | ICD-10-CM | POA: Insufficient documentation

## 2010-11-22 DIAGNOSIS — R1013 Epigastric pain: Secondary | ICD-10-CM | POA: Insufficient documentation

## 2010-11-22 DIAGNOSIS — E7800 Pure hypercholesterolemia, unspecified: Secondary | ICD-10-CM | POA: Insufficient documentation

## 2010-11-22 DIAGNOSIS — R05 Cough: Secondary | ICD-10-CM | POA: Insufficient documentation

## 2010-11-22 DIAGNOSIS — R071 Chest pain on breathing: Secondary | ICD-10-CM | POA: Insufficient documentation

## 2010-11-22 DIAGNOSIS — K219 Gastro-esophageal reflux disease without esophagitis: Secondary | ICD-10-CM | POA: Insufficient documentation

## 2010-11-22 DIAGNOSIS — I2510 Atherosclerotic heart disease of native coronary artery without angina pectoris: Secondary | ICD-10-CM | POA: Insufficient documentation

## 2010-11-22 DIAGNOSIS — R059 Cough, unspecified: Secondary | ICD-10-CM | POA: Insufficient documentation

## 2010-11-22 DIAGNOSIS — F17200 Nicotine dependence, unspecified, uncomplicated: Secondary | ICD-10-CM | POA: Insufficient documentation

## 2010-11-22 DIAGNOSIS — Z951 Presence of aortocoronary bypass graft: Secondary | ICD-10-CM | POA: Insufficient documentation

## 2010-11-22 LAB — CBC
HCT: 37.2 % — ABNORMAL LOW (ref 39.0–52.0)
MCV: 91 fL (ref 78.0–100.0)
Platelets: 149 10*3/uL — ABNORMAL LOW (ref 150–400)

## 2010-11-22 LAB — RAPID URINE DRUG SCREEN, HOSP PERFORMED
Amphetamines: NOT DETECTED
Benzodiazepines: NOT DETECTED
Cocaine: POSITIVE — AB

## 2010-11-22 LAB — TROPONIN I: Troponin I: <0.3 ng/mL (ref <0.30)

## 2010-11-23 ENCOUNTER — Emergency Department (HOSPITAL_COMMUNITY)
Admission: EM | Admit: 2010-11-23 | Discharge: 2010-11-23 | Disposition: A | Discharge status: Home / Self Care | Payer: Medicaid Other | Attending: Emergency Medicine | Admitting: Emergency Medicine

## 2010-11-23 DIAGNOSIS — Z79899 Other long term (current) drug therapy: Secondary | ICD-10-CM | POA: Insufficient documentation

## 2010-11-23 DIAGNOSIS — I2510 Atherosclerotic heart disease of native coronary artery without angina pectoris: Secondary | ICD-10-CM | POA: Insufficient documentation

## 2010-11-23 DIAGNOSIS — R1013 Epigastric pain: Secondary | ICD-10-CM | POA: Insufficient documentation

## 2010-11-23 DIAGNOSIS — E7800 Pure hypercholesterolemia, unspecified: Secondary | ICD-10-CM | POA: Insufficient documentation

## 2010-11-23 DIAGNOSIS — F319 Bipolar disorder, unspecified: Secondary | ICD-10-CM | POA: Insufficient documentation

## 2010-11-23 DIAGNOSIS — Z951 Presence of aortocoronary bypass graft: Secondary | ICD-10-CM | POA: Insufficient documentation

## 2010-11-23 DIAGNOSIS — K2970 Gastritis, unspecified, without bleeding: Secondary | ICD-10-CM | POA: Insufficient documentation

## 2010-11-23 DIAGNOSIS — K219 Gastro-esophageal reflux disease without esophagitis: Secondary | ICD-10-CM | POA: Insufficient documentation

## 2010-12-17 NOTE — Assessment & Plan Note (Signed)
  NAME:  BARNIE, SOPKO NO.:  [id number]  MEDICAL RECORD NO.:  [id number]  LOCATION:  9147                          FACILITY:  [facility]  PHYSICIAN:  Anselm Jungling, MD  DATE OF BIRTH:  08/27/1965  DATE OF ADMISSION:  10/09/2010 DATE OF DISCHARGE:  10/12/2010                      PSYCHIATRIC ADMISSION ASSESSMENT   IDENTIFYING INFORMATION:  This is the third [facility] admission for Sten, a 45- year-old, single African American male who was admitted for alcohol dependence and substance abuse.  MEDICAL EVALUATION:  He was medically stabilized in our emergency room where he was found to be physically healthy and appropriate for detox from alcohol.  He was found to have GERD and was prescribed 40 mg of Protonix and has no other significant medical issues.  HOME MEDICATIONS:  Are none.  CHRONIC MEDICAL PROBLEMS:  Are GERD.  MENTAL STATUS EVALUATION:  He was fully alert, thinking nonpsychotic, and no suicidal or homicidal thoughts.  No evidence of delusions.  COURSE OF HOSPITALIZATION:  He was admitted to our dual diagnosis unit given an initial working diagnosis of alcohol dependence and started on a Librium protocol, which he tolerated well.  Liver enzymes were checked and found to be SGOT 26, SGPT 19, alkaline phosphatase 78, total bilirubin 0.3.  By the 7th, he was doing very well and was given a final dose of Librium 25 mg on the day of discharge, May 7.  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  DISCHARGE PLAN:  Follow up at [facility] Oct 27, 2010 at 11 o'clock a.m.  DISCHARGE MEDICATIONS: 1. Celexa 20 mg daily. 2. Quetiapine 100 mg nightly. 3. Zantac 75 mg daily as needed for heartburn.     Margaret A. Lorin Picket, N.P.   ______________________________ Anselm Jungling, MD    MAS/MEDQ  D:  12/15/2010  T:  12/16/2010  Job:  [id number]  Electronically Signed by Kari Baars N.P. on 12/16/2010 10:16:28  AM Electronically Signed by Nelly Rout MD on 12/17/2010 10:53:06 AM

## 2010-12-24 ENCOUNTER — Emergency Department (HOSPITAL_COMMUNITY): Payer: Medicaid Other

## 2010-12-24 ENCOUNTER — Observation Stay (HOSPITAL_COMMUNITY)
Admission: EM | Admit: 2010-12-24 | Discharge: 2010-12-25 | Disposition: A | Discharge status: Skilled Nursing Facility | Payer: Medicaid Other | Attending: Family Medicine | Admitting: Family Medicine

## 2010-12-24 DIAGNOSIS — F1410 Cocaine abuse, uncomplicated: Secondary | ICD-10-CM | POA: Insufficient documentation

## 2010-12-24 DIAGNOSIS — K219 Gastro-esophageal reflux disease without esophagitis: Secondary | ICD-10-CM | POA: Insufficient documentation

## 2010-12-24 DIAGNOSIS — F3130 Bipolar disorder, current episode depressed, mild or moderate severity, unspecified: Secondary | ICD-10-CM | POA: Insufficient documentation

## 2010-12-24 DIAGNOSIS — E860 Dehydration: Secondary | ICD-10-CM | POA: Insufficient documentation

## 2010-12-24 DIAGNOSIS — I2510 Atherosclerotic heart disease of native coronary artery without angina pectoris: Secondary | ICD-10-CM | POA: Insufficient documentation

## 2010-12-24 DIAGNOSIS — F17200 Nicotine dependence, unspecified, uncomplicated: Secondary | ICD-10-CM | POA: Insufficient documentation

## 2010-12-24 DIAGNOSIS — R55 Syncope and collapse: Principal | ICD-10-CM | POA: Insufficient documentation

## 2010-12-24 LAB — COMPREHENSIVE METABOLIC PANEL
ALT: 14 U/L (ref 0–53)
BUN: 11 mg/dL (ref 6–23)
CO2: 25 mEq/L (ref 19–32)
Calcium: 8.4 mg/dL (ref 8.4–10.5)
Creatinine, Ser: 1.07 mg/dL (ref 0.50–1.35)
GFR calc Af Amer: >60 mL/min (ref >60)
GFR calc non Af Amer: >60 mL/min (ref >60)
Glucose, Bld: 72 mg/dL (ref 70–99)
Sodium: 141 mEq/L (ref 135–145)

## 2010-12-24 LAB — RAPID URINE DRUG SCREEN, HOSP PERFORMED
Amphetamines: NOT DETECTED
Benzodiazepines: NOT DETECTED
Opiates: NOT DETECTED

## 2010-12-24 LAB — URINALYSIS, ROUTINE W REFLEX MICROSCOPIC
Bilirubin Urine: NEGATIVE
Glucose, UA: NEGATIVE mg/dL
Hgb urine dipstick: NEGATIVE
Ketones, ur: NEGATIVE mg/dL
Leukocytes, UA: NEGATIVE
Nitrite: NEGATIVE
Protein, ur: NEGATIVE mg/dL
Specific Gravity, Urine: 1.013 (ref 1.005–1.030)
Urobilinogen, UA: 1 mg/dL (ref 0.0–1.0)
pH: 6 (ref 5.0–8.0)

## 2010-12-24 LAB — TSH: TSH: 0.821 u[IU]/mL (ref 0.350–4.500)

## 2010-12-24 LAB — CBC
Hemoglobin: 13.2 g/dL (ref 13.0–17.0)
MCHC: 34.6 g/dL (ref 30.0–36.0)
RDW: 13.6 % (ref 11.5–15.5)

## 2010-12-24 LAB — HEMOGLOBIN A1C
Hgb A1c MFr Bld: 5.2 % (ref <5.7)
Mean Plasma Glucose: 103 mg/dL (ref <117)

## 2010-12-24 LAB — URINE MICROSCOPIC-ADD ON

## 2010-12-24 LAB — CARDIAC PANEL(CRET KIN+CKTOT+MB+TROPI)
CK, MB: 2.2 ng/mL (ref 0.3–4.0)
Total CK: 232 U/L (ref 7–232)
Total CK: 225 U/L (ref 7–232)

## 2010-12-24 LAB — PRO B NATRIURETIC PEPTIDE: Pro B Natriuretic peptide (BNP): 61.5 pg/mL (ref 0–125)

## 2010-12-24 LAB — DIFFERENTIAL
Basophils Absolute: 0 10*3/uL (ref 0.0–0.1)
Basophils Relative: 0 % (ref 0–1)
Monocytes Absolute: 0.6 10*3/uL (ref 0.1–1.0)
Neutro Abs: 4.7 10*3/uL (ref 1.7–7.7)

## 2010-12-24 LAB — PROTIME-INR
INR: 1.21 (ref 0.00–1.49)
Prothrombin Time: 15.6 seconds — ABNORMAL HIGH (ref 11.6–15.2)

## 2010-12-24 LAB — CK TOTAL AND CKMB (NOT AT ARMC): Total CK: 234 U/L — ABNORMAL HIGH (ref 7–232)

## 2010-12-25 DIAGNOSIS — R55 Syncope and collapse: Secondary | ICD-10-CM

## 2010-12-25 LAB — LIPID PANEL
Cholesterol: 134 mg/dL (ref 0–200)
HDL: 68 mg/dL
LDL Cholesterol: 54 mg/dL (ref 0–99)
Total CHOL/HDL Ratio: 2 ratio
Triglycerides: 59 mg/dL
VLDL: 12 mg/dL (ref 0–40)

## 2010-12-25 LAB — BASIC METABOLIC PANEL WITH GFR
BUN: 15 mg/dL (ref 6–23)
CO2: 27 meq/L (ref 19–32)
Calcium: 8.3 mg/dL — ABNORMAL LOW (ref 8.4–10.5)
Chloride: 108 meq/L (ref 96–112)
Creatinine, Ser: 1.07 mg/dL (ref 0.50–1.35)
GFR calc Af Amer: >60 mL/min
GFR calc non Af Amer: >60 mL/min
Glucose, Bld: 95 mg/dL (ref 70–99)
Potassium: 3.8 meq/L (ref 3.5–5.1)
Sodium: 138 meq/L (ref 135–145)

## 2010-12-25 LAB — CARDIAC PANEL(CRET KIN+CKTOT+MB+TROPI)
CK, MB: 1.8 ng/mL (ref 0.3–4.0)
Relative Index: 0.9 (ref 0.0–2.5)
Total CK: 201 U/L (ref 7–232)
Troponin I: <0.3 ng/mL

## 2010-12-25 LAB — CBC
MCH: 30.8 pg (ref 26.0–34.0)
Platelets: 126 10*3/uL — ABNORMAL LOW (ref 150–400)
RBC: 3.6 MIL/uL — ABNORMAL LOW (ref 4.22–5.81)
WBC: 5.4 10*3/uL (ref 4.0–10.5)

## 2010-12-31 NOTE — H&P (Signed)
NAME:  [name] [name], [name] [name] NO.:  [id number]  MEDICAL RECORD NO.:  [id number]  LOCATION:  1509                         FACILITY:  [facility]  PHYSICIAN:  Erick Blinks, MD     DATE OF BIRTH:  24-Jul-1965  DATE OF ADMISSION:  12/24/2010 DATE OF DISCHARGE:                             HISTORY & PHYSICAL   PRIMARY CARE PHYSICIAN:  [facility].  CHIEF COMPLAINT:  Syncope.  HISTORY OF PRESENT ILLNESS:  This is a 45 year old African American gentleman with history of nonobstructive coronary artery disease and polysubstance abuse.  The patient presents to the emergency room today after episode of syncope.  He reports that he had gotten up at approximately 5:30 this morning and began walking [place]  This is approximately 4-hour walk and after arriving at his destination, the patient started to feel lightheaded and dizzy.  He subsequently sat down and put his head down on a table.  He started to began sweating and felt nauseous.  He subsequently passed out for what he reports and when he regained consciousness, he was surrounded by EMS.  He denies any bowel or bladder incontinence.  There was no tongue biting or reported jerking.  He denies any chest pain or shortness of breath, fever, cough, dysuria, diarrhea.  He reports p.o. intake as being normal for him and he reported improvement in his symptoms when he was brought to the emergency room and received IV fluids.  The patient was recently discharged from the [facility] yesterday after having a 5-day stay there for depression.  The patient was evaluated in the ER and was reportedly not found to be orthostatic and due to his history of coronary disease, he was referred for observation.  PAST MEDICAL HISTORY: 1. Nonobstructive coronary disease.  The patient reports having a     stent placed approximately 2-3 years ago at [facility].  Review of     records  reveals cardiac catheterization report from June 2009 that     was reported to have a 60% lesion in LAD and normal ejection     fraction.  There is no mention of placement of any stent. 2. History of tobacco abuse. 3. History of alcohol abuse. 4. History of cocaine abuse. 5. Bipolar disorder. 6. Depression. 7. GERD.  ALLERGIES:  ASPIRIN CAUSES ACID REFLUX.  MEDICATIONS:  Prior to admission: 1. Zantac over-the-counter 1 tablet daily as needed. 2. Seroquel 100 mg p.o. daily at bedtime. 3. Protonix 40 mg p.o. daily. 4. Celexa 20 mg 1 tablet daily.  The patient states that he has not taken any of these medicines for the past 2 months.  SOCIAL HISTORY:  The patient smokes half a pack of cigarettes a day.  He was drinking alcohol regularly in the past.  He says that is not the case anymore.  He just drinks on weekends.  He reports drinking two 40 ounces beer yesterday.  Denies any history of alcohol withdrawal.  He reports using cocaine on the day before being admitted to [facility] which was on Thursday.  Patient is currently living with his daughter reportedly.  FAMILY  HISTORY:  There is no report of known coronary artery disease.  REVIEW OF SYSTEMS:  All systems reviewed and pertinent positive in the HPI.  PHYSICAL EXAMINATION:  VITAL SIGNS:  Temperature 97.7, heart rate of 60, blood pressure 104/59, respiratory rate of 18, pulse ox 90% room air. GENERAL:  The patient in no acute distress, lying in bed. HEENT:  Normocephalic, atraumatic.  Pupils are equal, round, reactive to light.  Mucous membranes are dry. NECK:  Supple. CHEST:  Clear to auscultation bilaterally. CARDIAC:  Shows S1, S2 with regular rate and rhythm. ABDOMEN:  Soft, nontender.  Bowel sounds are active. EXTREMITIES:  Show no pedal edema. NEUROLOGIC:  The patient has 5/5 strength bilaterally.  Cranial nerves II-XII are grossly intact.  LABORATORY DATA:  WBC 6.5, hemoglobin 13.2, platelet count of  136. Cardiac enzymes are negative x1, alcohol level less than 11.  Sodium 141, potassium 4.8, chloride 107, bicarb 25, glucose 72, BUN 11, creatinine 1.07.  Liver enzymes were within normal limits.  Pro-BNP is 61.5.  Urinalysis is negative.  Chest x-ray does not show any acute disease.  EKG is reviewed and does not show any change from prior EKG.  ASSESSMENT/PLAN: 1. Syncope likely related to dehydration and vasovagal event.  We will     hydrate the patient.  Due to his history of coronary artery     disease, we will cycle his cardiac enzymes and monitor him on     telemetry.  We will also check a 2-D echocardiogram and carotid     Dopplers to rule out any carotid artery stenosis.  We will check     TSH as well as urine drug screen.  An EKG will be repeated in the     morning as well as routine labs.  This patient symptomatically     improved by morning and workup is negative and he will possibly be     discharged tomorrow with further followup with primary care     physician.  We will also continue him on aspirin right now and beta     blocker will be avoided due to his history of cocaine use.  We will     also check his fasting lipid panel and determine whether he needs a     statin therapy. 2. Gastroesophageal reflux disease.  We will continue Protonix. 3. Depression/bipolar.  We will continue his outpatient medication. 4. Polysubstance abuse.  We will ask for social work consult, provide     resources, and give him a nicotine patch.  Further orders will be     per the clinical course.     Erick Blinks, MD     JM/MEDQ  D:  12/24/2010  T:  12/24/2010  Job:  [id number]  Electronically Signed by Durward Mallard Nael Petrosyan  on 12/31/2010 01:05:46 AM

## 2010-12-31 NOTE — Discharge Summary (Signed)
  NAME:  [name] [name], VANRIPER NO.:  [id number]  MEDICAL RECORD NO.:  [id number]  LOCATION:  1509                         FACILITY:  [facility]  PHYSICIAN:  Erick Blinks, MD     DATE OF BIRTH:  1965/10/08  DATE OF ADMISSION:  12/24/2010 DATE OF DISCHARGE:  12/25/2010                              DISCHARGE SUMMARY   PRIMARY CARE PHYSICIAN:  [facility].  DISCHARGE DIAGNOSES: 1. Syncope, likely vasovagal, secondary to dehydration. 2. History of tobacco abuse. 3. History of alcohol abuse. 4. History of cocaine abuse. 5. Bipolar disorder. 6. Depression. 7. Gastroesophageal reflux disease.  DISCHARGE MEDICATIONS: 1. Aspirin 81 mg p.o. daily. 2. Zantac over-the-counter 1 tablet p.o. daily p.r.n. 3. Seroquel 100 mg p.o. q.h.s. 4. Celexa 20 mg 1 tablet p.o. daily. 5. Protonix 40 mg 1 tablet p.o. daily.  ADMISSION HISTORY:  This is a 45 year old gentleman who came to the ER after an episode of syncope.  Patient had been walking for approximately 4 hours prior to arrival and he had suddenly got lightheaded, dizzy, nauseous and subsequently passed out.  He was brought to the Emergency Room where he was noted to be slightly hypotensive and was given IV fluids and had improvement in his symptoms, subsequently, he was admitted for further evaluation.  For details, please refer to the history and physical dictated on December 24, 2010.  HOSPITAL COURSE:  Syncope:  Patient does not have any further episodes of lightheadedness or syncope while here in the hospital.  He will be hydrated.  EKG done did not show any acute changes.  Cardiac enzymes were negative x3.  D-dimer also was found to be negative.  2-D echocardiogram was done, which showed a preserved ejection fraction.  No wall motion abnormalities and no significant valvular abnormalities. Carotid Dopplers were also done, which were negative for any carotid stenosis bilaterally.  Patient has improved condition and was felt  safe to be discharged home at this time.  He will need to follow up with his primary care physician.  DISCHARGE INSTRUCTIONS:  Patient should follow up with his primary care physician at [facility], an appointment has been arranged for him.  He should continue on a heart-healthy diet, conduct activity as tolerated.  CONDITION AT TIME OF DISCHARGE:  Improved.     Erick Blinks, MD     JM/MEDQ  D:  12/25/2010  T:  12/25/2010  Job:  [id number]  Electronically Signed by Durward Mallard Shantanu Strauch  on 12/31/2010 01:06:28 AM

## 2011-02-11 ENCOUNTER — Emergency Department (HOSPITAL_COMMUNITY)
Admission: EM | Admit: 2011-02-11 | Discharge: 2011-02-11 | Disposition: A | Discharge status: Home / Self Care | Payer: Medicaid Other | Attending: Emergency Medicine | Admitting: Emergency Medicine

## 2011-02-11 DIAGNOSIS — Z951 Presence of aortocoronary bypass graft: Secondary | ICD-10-CM | POA: Insufficient documentation

## 2011-02-11 DIAGNOSIS — Z139 Encounter for screening, unspecified: Secondary | ICD-10-CM | POA: Insufficient documentation

## 2011-02-11 DIAGNOSIS — Z5900 Homelessness unspecified: Secondary | ICD-10-CM | POA: Insufficient documentation

## 2011-02-11 DIAGNOSIS — F17200 Nicotine dependence, unspecified, uncomplicated: Secondary | ICD-10-CM | POA: Insufficient documentation

## 2011-02-11 DIAGNOSIS — F329 Major depressive disorder, single episode, unspecified: Secondary | ICD-10-CM | POA: Insufficient documentation

## 2011-02-11 DIAGNOSIS — F1010 Alcohol abuse, uncomplicated: Secondary | ICD-10-CM | POA: Insufficient documentation

## 2011-02-11 DIAGNOSIS — F3289 Other specified depressive episodes: Secondary | ICD-10-CM | POA: Insufficient documentation

## 2011-02-11 DIAGNOSIS — E7800 Pure hypercholesterolemia, unspecified: Secondary | ICD-10-CM | POA: Insufficient documentation

## 2011-02-11 DIAGNOSIS — I2510 Atherosclerotic heart disease of native coronary artery without angina pectoris: Secondary | ICD-10-CM | POA: Insufficient documentation

## 2011-02-11 LAB — COMPREHENSIVE METABOLIC PANEL
ALT: 18 U/L (ref 0–53)
Alkaline Phosphatase: 82 U/L (ref 39–117)
CO2: 25 mEq/L (ref 19–32)
Chloride: 104 mEq/L (ref 96–112)
GFR calc Af Amer: >60 mL/min (ref >60)
GFR calc non Af Amer: >60 mL/min (ref >60)
Glucose, Bld: 85 mg/dL (ref 70–99)
Potassium: 3.7 mEq/L (ref 3.5–5.1)
Sodium: 140 mEq/L (ref 135–145)

## 2011-02-11 LAB — ETHANOL: Alcohol, Ethyl (B): 35 mg/dL — ABNORMAL HIGH (ref 0–11)

## 2011-02-11 LAB — DIFFERENTIAL
Basophils Absolute: 0 10*3/uL (ref 0.0–0.1)
Basophils Relative: 0 % (ref 0–1)
Neutro Abs: 2.6 10*3/uL (ref 1.7–7.7)
Neutrophils Relative %: 46 % (ref 43–77)

## 2011-02-11 LAB — RAPID URINE DRUG SCREEN, HOSP PERFORMED
Barbiturates: NOT DETECTED
Benzodiazepines: NOT DETECTED

## 2011-02-11 LAB — CBC
Hemoglobin: 14.5 g/dL (ref 13.0–17.0)
RBC: 4.62 MIL/uL (ref 4.22–5.81)

## 2011-02-27 ENCOUNTER — Emergency Department (HOSPITAL_COMMUNITY)
Admission: EM | Admit: 2011-02-27 | Discharge: 2011-02-28 | Disposition: A | Discharge status: Home / Self Care | Payer: Medicaid Other | Attending: Emergency Medicine | Admitting: Emergency Medicine

## 2011-02-27 DIAGNOSIS — F319 Bipolar disorder, unspecified: Secondary | ICD-10-CM | POA: Insufficient documentation

## 2011-02-27 DIAGNOSIS — E7800 Pure hypercholesterolemia, unspecified: Secondary | ICD-10-CM | POA: Insufficient documentation

## 2011-02-27 DIAGNOSIS — I2510 Atherosclerotic heart disease of native coronary artery without angina pectoris: Secondary | ICD-10-CM | POA: Insufficient documentation

## 2011-02-27 DIAGNOSIS — K219 Gastro-esophageal reflux disease without esophagitis: Secondary | ICD-10-CM | POA: Insufficient documentation

## 2011-02-28 LAB — DIFFERENTIAL
Basophils Absolute: 0 10*3/uL (ref 0.0–0.1)
Eosinophils Relative: 3 % (ref 0–5)
Lymphocytes Relative: 36 % (ref 12–46)
Neutro Abs: 2.9 10*3/uL (ref 1.7–7.7)

## 2011-02-28 LAB — CBC
HCT: 40.8 % (ref 39.0–52.0)
RDW: 13 % (ref 11.5–15.5)
WBC: 5.5 10*3/uL (ref 4.0–10.5)

## 2011-02-28 LAB — COMPREHENSIVE METABOLIC PANEL
ALT: 19 U/L (ref 0–53)
AST: 22 U/L (ref 0–37)
Albumin: 3.9 g/dL (ref 3.5–5.2)
Alkaline Phosphatase: 65 U/L (ref 39–117)
BUN: 15 mg/dL (ref 6–23)
Chloride: 104 mEq/L (ref 96–112)
Potassium: 4.3 mEq/L (ref 3.5–5.1)
Total Bilirubin: 0.6 mg/dL (ref 0.3–1.2)

## 2011-02-28 LAB — RAPID URINE DRUG SCREEN, HOSP PERFORMED
Benzodiazepines: NOT DETECTED
Cocaine: NOT DETECTED

## 2011-02-28 LAB — URINALYSIS, ROUTINE W REFLEX MICROSCOPIC
Bilirubin Urine: NEGATIVE
Glucose, UA: NEGATIVE mg/dL
Hgb urine dipstick: NEGATIVE
Specific Gravity, Urine: 1.016 (ref 1.005–1.030)
pH: 6 (ref 5.0–8.0)

## 2011-02-28 LAB — URINE MICROSCOPIC-ADD ON

## 2011-02-28 LAB — ETHANOL: Alcohol, Ethyl (B): <11 mg/dL (ref 0–11)

## 2011-03-01 LAB — CBC
HCT: 38.4 — ABNORMAL LOW
Hemoglobin: 13
MCHC: 33.8
RDW: 13.3

## 2011-03-01 LAB — DIFFERENTIAL
Basophils Absolute: 0
Basophils Relative: 0
Eosinophils Relative: 2
Monocytes Absolute: 0.7
Neutro Abs: 2.1

## 2011-03-01 LAB — I-STAT 8, (EC8 V) (CONVERTED LAB)
BUN: 9
Chloride: 104
HCT: 41
Hemoglobin: 13.9
Operator id: 294511
Potassium: 3.2 — ABNORMAL LOW
Sodium: 138

## 2011-03-01 LAB — POCT CARDIAC MARKERS
Myoglobin, poc: 50.3
Operator id: 294511

## 2011-03-04 LAB — POCT I-STAT, CHEM 8
BUN: 14
BUN: 15
Calcium, Ion: 1.15
Chloride: 103
Creatinine, Ser: 1.2
Creatinine, Ser: 1.4
Hemoglobin: 14.6
Potassium: 4.2
Sodium: 135
Sodium: 138
TCO2: 26
TCO2: 27

## 2011-03-04 LAB — CBC
HCT: 42.6
Hemoglobin: 13.1
MCHC: 34.8
MCV: 93.7
MCV: 94.5
RBC: 3.99 — ABNORMAL LOW
RBC: 4.55
WBC: 4.5
WBC: 5.2

## 2011-03-04 LAB — POCT CARDIAC MARKERS
Myoglobin, poc: 80.6
Operator id: 257131
Troponin i, poc: <0.05

## 2011-03-04 LAB — CK TOTAL AND CKMB (NOT AT ARMC)
Relative Index: 0.7
Total CK: 415 — ABNORMAL HIGH

## 2011-03-04 LAB — COMPREHENSIVE METABOLIC PANEL
ALT: 17
AST: 25
Albumin: 3.4 — ABNORMAL LOW
CO2: 24
Calcium: 8.8
GFR calc Af Amer: >60
GFR calc non Af Amer: >60
Sodium: 134 — ABNORMAL LOW

## 2011-03-04 LAB — RAPID URINE DRUG SCREEN, HOSP PERFORMED
Barbiturates: NOT DETECTED
Cocaine: NOT DETECTED
Opiates: POSITIVE — AB

## 2011-03-04 LAB — PROTIME-INR: INR: 1.1

## 2011-03-04 LAB — TROPONIN I: Troponin I: <0.01

## 2011-03-04 LAB — BASIC METABOLIC PANEL
CO2: 28
Chloride: 108
GFR calc Af Amer: >60
Sodium: 140

## 2011-03-04 LAB — CARDIAC PANEL(CRET KIN+CKTOT+MB+TROPI)
Relative Index: 0.6
Total CK: 346 — ABNORMAL HIGH
Troponin I: <0.01

## 2011-03-04 LAB — APTT: aPTT: 32

## 2011-03-04 LAB — LIPID PANEL
HDL: 77
Total CHOL/HDL Ratio: 2.1
Triglycerides: 58
VLDL: 12

## 2011-03-08 LAB — POCT I-STAT, CHEM 8
BUN: 10
BUN: 13
Calcium, Ion: 1.06 — ABNORMAL LOW
Calcium, Ion: 1.12
Chloride: 103
Chloride: 105
Creatinine, Ser: 1.2
Creatinine, Ser: 1.4
Glucose, Bld: 89
Glucose, Bld: 94
HCT: 45
Hemoglobin: 15.3
Potassium: 3.5
Sodium: 137
TCO2: 25
TCO2: 28

## 2011-03-08 LAB — URINALYSIS, ROUTINE W REFLEX MICROSCOPIC
Bilirubin Urine: NEGATIVE
Hgb urine dipstick: NEGATIVE
Leukocytes, UA: NEGATIVE
Nitrite: NEGATIVE
Protein, ur: 100 — AB
Specific Gravity, Urine: 1.007
Specific Gravity, Urine: 1.02
Urobilinogen, UA: 1
Urobilinogen, UA: 1
pH: 6

## 2011-03-08 LAB — CBC
HCT: 43.2
Hemoglobin: 12.9 — ABNORMAL LOW
Hemoglobin: 14.3
MCHC: 33
MCHC: 33.3
MCV: 93.8
Platelets: 184
RBC: 4.61
RDW: 13.1
RDW: 13.3

## 2011-03-08 LAB — POCT CARDIAC MARKERS
CKMB, poc: <1 — ABNORMAL LOW
CKMB, poc: <1 — ABNORMAL LOW
Myoglobin, poc: 39.2
Myoglobin, poc: 52.7
Troponin i, poc: <0.05
Troponin i, poc: <0.05

## 2011-03-08 LAB — RAPID URINE DRUG SCREEN, HOSP PERFORMED
Barbiturates: POSITIVE — AB
Barbiturates: NOT DETECTED
Benzodiazepines: NOT DETECTED
Cocaine: NOT DETECTED
Cocaine: NOT DETECTED
Opiates: NOT DETECTED
Opiates: POSITIVE — AB
Opiates: NOT DETECTED
Tetrahydrocannabinol: POSITIVE — AB
Tetrahydrocannabinol: POSITIVE — AB

## 2011-03-08 LAB — COMPREHENSIVE METABOLIC PANEL
ALT: 16
Albumin: 3.3 — ABNORMAL LOW
Calcium: 9.1
GFR calc Af Amer: >60
Glucose, Bld: 107 — ABNORMAL HIGH
Sodium: 141
Total Protein: 6.5

## 2011-03-08 LAB — URINE MICROSCOPIC-ADD ON

## 2011-03-08 LAB — DIFFERENTIAL
Basophils Relative: 0
Eosinophils Absolute: 0
Eosinophils Absolute: 0.2
Eosinophils Relative: 1
Lymphs Abs: 1.5
Monocytes Absolute: 0.5
Monocytes Relative: 6
Monocytes Relative: 9
Neutrophils Relative %: 54

## 2011-03-08 LAB — BASIC METABOLIC PANEL
CO2: 27
Chloride: 104
GFR calc Af Amer: >60
Glucose, Bld: 98
Sodium: 140

## 2011-03-10 LAB — COMPREHENSIVE METABOLIC PANEL
ALT: 11
AST: 22
Albumin: 3.2 — ABNORMAL LOW
Calcium: 8.6
GFR calc Af Amer: >60
Sodium: 135
Total Protein: 6.1

## 2011-03-10 LAB — POCT I-STAT, CHEM 8
Creatinine, Ser: 1.3
HCT: 45
Hemoglobin: 15.3
Potassium: 3.9
Sodium: 141

## 2011-03-10 LAB — RAPID URINE DRUG SCREEN, HOSP PERFORMED
Cocaine: NOT DETECTED
Opiates: POSITIVE — AB

## 2011-03-10 LAB — CARDIAC PANEL(CRET KIN+CKTOT+MB+TROPI)
CK, MB: 0.8
Relative Index: 0.6
Total CK: 149

## 2011-03-10 LAB — POCT CARDIAC MARKERS
CKMB, poc: <1 — ABNORMAL LOW
Myoglobin, poc: 40.5

## 2011-03-10 LAB — CBC
MCHC: 34
Platelets: 142 — ABNORMAL LOW
RBC: 4.14 — ABNORMAL LOW
RDW: 13.6

## 2011-03-10 LAB — TROPONIN I: Troponin I: 0.02

## 2011-03-10 LAB — TSH: TSH: 1.896

## 2011-03-16 LAB — COMPREHENSIVE METABOLIC PANEL
AST: 25
CO2: 26
Calcium: 9.1
Chloride: 106
Glucose, Bld: 96
Sodium: 141

## 2011-03-16 LAB — CBC
Hemoglobin: 14.4
MCHC: 34.2
MCV: 92.6
RBC: 4.55
WBC: 4.9

## 2011-03-16 LAB — URINALYSIS, ROUTINE W REFLEX MICROSCOPIC
Glucose, UA: NEGATIVE
Hgb urine dipstick: NEGATIVE
Ketones, ur: NEGATIVE
Protein, ur: NEGATIVE
pH: 6

## 2011-03-16 LAB — RAPID URINE DRUG SCREEN, HOSP PERFORMED
Barbiturates: NOT DETECTED
Benzodiazepines: NOT DETECTED
Cocaine: POSITIVE — AB

## 2011-03-16 LAB — DIFFERENTIAL
Lymphocytes Relative: 45
Monocytes Absolute: 0.5
Monocytes Relative: 11
Neutro Abs: 2.1

## 2011-03-16 LAB — URINE MICROSCOPIC-ADD ON

## 2011-04-09 ENCOUNTER — Emergency Department (HOSPITAL_COMMUNITY): Payer: Medicaid Other

## 2011-04-09 ENCOUNTER — Emergency Department (HOSPITAL_COMMUNITY)
Admission: EM | Admit: 2011-04-09 | Discharge: 2011-04-10 | Disposition: A | Discharge status: Home / Self Care | Payer: Medicaid Other | Attending: Emergency Medicine | Admitting: Emergency Medicine

## 2011-04-09 DIAGNOSIS — F319 Bipolar disorder, unspecified: Secondary | ICD-10-CM | POA: Insufficient documentation

## 2011-04-09 DIAGNOSIS — K219 Gastro-esophageal reflux disease without esophagitis: Secondary | ICD-10-CM | POA: Insufficient documentation

## 2011-04-09 DIAGNOSIS — X58XXXA Exposure to other specified factors, initial encounter: Secondary | ICD-10-CM | POA: Insufficient documentation

## 2011-04-09 DIAGNOSIS — E7800 Pure hypercholesterolemia, unspecified: Secondary | ICD-10-CM | POA: Insufficient documentation

## 2011-04-09 DIAGNOSIS — I2510 Atherosclerotic heart disease of native coronary artery without angina pectoris: Secondary | ICD-10-CM | POA: Insufficient documentation

## 2011-04-09 DIAGNOSIS — M2669 Other specified disorders of temporomandibular joint: Secondary | ICD-10-CM | POA: Insufficient documentation

## 2011-08-16 ENCOUNTER — Encounter (HOSPITAL_COMMUNITY): Payer: Self-pay | Admitting: Nurse Practitioner

## 2011-08-16 ENCOUNTER — Encounter (HOSPITAL_COMMUNITY): Admission: EM | Disposition: A | Discharge status: Home / Self Care | Payer: Self-pay | Source: Home / Self Care

## 2011-08-16 ENCOUNTER — Ambulatory Visit (HOSPITAL_COMMUNITY)
Admission: EM | Admit: 2011-08-16 | Discharge: 2011-08-17 | Disposition: A | Discharge status: Home / Self Care | Payer: Medicaid Other | Attending: Internal Medicine | Admitting: Internal Medicine

## 2011-08-16 ENCOUNTER — Inpatient Hospital Stay (HOSPITAL_COMMUNITY): Payer: Medicaid Other

## 2011-08-16 ENCOUNTER — Other Ambulatory Visit: Payer: Self-pay

## 2011-08-16 ENCOUNTER — Ambulatory Visit (HOSPITAL_COMMUNITY): Admit: 2011-08-16 | Payer: Self-pay | Admitting: Interventional Cardiology

## 2011-08-16 DIAGNOSIS — R9431 Abnormal electrocardiogram [ECG] [EKG]: Secondary | ICD-10-CM | POA: Insufficient documentation

## 2011-08-16 DIAGNOSIS — R0789 Other chest pain: Secondary | ICD-10-CM

## 2011-08-16 DIAGNOSIS — R001 Bradycardia, unspecified: Secondary | ICD-10-CM | POA: Insufficient documentation

## 2011-08-16 DIAGNOSIS — F1210 Cannabis abuse, uncomplicated: Secondary | ICD-10-CM | POA: Insufficient documentation

## 2011-08-16 DIAGNOSIS — F17200 Nicotine dependence, unspecified, uncomplicated: Secondary | ICD-10-CM | POA: Insufficient documentation

## 2011-08-16 DIAGNOSIS — F1010 Alcohol abuse, uncomplicated: Secondary | ICD-10-CM | POA: Insufficient documentation

## 2011-08-16 DIAGNOSIS — R072 Precordial pain: Secondary | ICD-10-CM

## 2011-08-16 DIAGNOSIS — Z720 Tobacco use: Secondary | ICD-10-CM | POA: Insufficient documentation

## 2011-08-16 DIAGNOSIS — K219 Gastro-esophageal reflux disease without esophagitis: Secondary | ICD-10-CM | POA: Insufficient documentation

## 2011-08-16 DIAGNOSIS — F1411 Cocaine abuse, in remission: Secondary | ICD-10-CM | POA: Insufficient documentation

## 2011-08-16 DIAGNOSIS — I2510 Atherosclerotic heart disease of native coronary artery without angina pectoris: Secondary | ICD-10-CM

## 2011-08-16 DIAGNOSIS — Z23 Encounter for immunization: Secondary | ICD-10-CM | POA: Insufficient documentation

## 2011-08-16 DIAGNOSIS — F1011 Alcohol abuse, in remission: Secondary | ICD-10-CM | POA: Insufficient documentation

## 2011-08-16 DIAGNOSIS — F319 Bipolar disorder, unspecified: Secondary | ICD-10-CM | POA: Insufficient documentation

## 2011-08-16 DIAGNOSIS — R55 Syncope and collapse: Secondary | ICD-10-CM | POA: Insufficient documentation

## 2011-08-16 HISTORY — DX: Cocaine abuse, in remission: F14.11

## 2011-08-16 HISTORY — DX: Cannabis abuse, uncomplicated: F12.10

## 2011-08-16 HISTORY — DX: Other chest pain: R07.89

## 2011-08-16 HISTORY — DX: Atherosclerotic heart disease of native coronary artery without angina pectoris: I25.10

## 2011-08-16 HISTORY — DX: Alcohol abuse, in remission: F10.11

## 2011-08-16 HISTORY — DX: Syncope and collapse: R55

## 2011-08-16 HISTORY — DX: Tobacco use: Z72.0

## 2011-08-16 HISTORY — DX: Gastro-esophageal reflux disease without esophagitis: K21.9

## 2011-08-16 HISTORY — DX: Bipolar disorder, unspecified: F31.9

## 2011-08-16 HISTORY — DX: Bradycardia, unspecified: R00.1

## 2011-08-16 HISTORY — DX: Abnormal electrocardiogram (ECG) (EKG): R94.31

## 2011-08-16 HISTORY — PX: LEFT HEART CATHETERIZATION WITH CORONARY ANGIOGRAM: SHX5451

## 2011-08-16 LAB — COMPREHENSIVE METABOLIC PANEL
Alkaline Phosphatase: 76 U/L (ref 39–117)
BUN: 8 mg/dL (ref 6–23)
Calcium: 8.4 mg/dL (ref 8.4–10.5)
Creatinine, Ser: 1.05 mg/dL (ref 0.50–1.35)
GFR calc Af Amer: >90 mL/min (ref >90)
Glucose, Bld: 76 mg/dL (ref 70–99)
Total Protein: 6.4 g/dL (ref 6.0–8.3)

## 2011-08-16 LAB — MAGNESIUM: Magnesium: 1.7 mg/dL (ref 1.5–2.5)

## 2011-08-16 LAB — CBC
HCT: 39.5 % (ref 39.0–52.0)
Hemoglobin: 14 g/dL (ref 13.0–17.0)
MCH: 30.8 pg (ref 26.0–34.0)
MCHC: 35.4 g/dL (ref 30.0–36.0)
MCV: 87 fL (ref 78.0–100.0)

## 2011-08-16 LAB — APTT: aPTT: 32 seconds (ref 24–37)

## 2011-08-16 LAB — HEMOGLOBIN A1C: Hgb A1c MFr Bld: 5.1 % (ref <5.7)

## 2011-08-16 LAB — CARDIAC PANEL(CRET KIN+CKTOT+MB+TROPI)
CK, MB: 2.4 ng/mL (ref 0.3–4.0)
Relative Index: 0.3 (ref 0.0–2.5)
Total CK: 554 U/L — ABNORMAL HIGH (ref 7–232)
Total CK: 949 U/L — ABNORMAL HIGH (ref 7–232)
Troponin I: <0.3 ng/mL (ref <0.30)
Troponin I: <0.3 ng/mL (ref <0.30)

## 2011-08-16 LAB — LIPID PANEL
Cholesterol: 163 mg/dL (ref 0–200)
LDL Cholesterol: 66 mg/dL (ref 0–99)
Triglycerides: 129 mg/dL (ref <150)

## 2011-08-16 LAB — D-DIMER, QUANTITATIVE: D-Dimer, Quant: 0.28 ug/mL-FEU (ref 0.00–0.48)

## 2011-08-16 SURGERY — LEFT HEART CATHETERIZATION WITH CORONARY ANGIOGRAM
Anesthesia: LOCAL

## 2011-08-16 MED ORDER — SODIUM CHLORIDE 0.9 % IV SOLN
INTRAVENOUS | Status: AC
  Administered 2011-08-16: 15:00:00 via INTRAVENOUS

## 2011-08-16 MED ORDER — MIDAZOLAM HCL 2 MG/2ML IJ SOLN
INTRAMUSCULAR | Status: AC
  Filled 2011-08-16: qty 2

## 2011-08-16 MED ORDER — ASPIRIN EC 81 MG PO TBEC: 81.0000 mg | DELAYED_RELEASE_TABLET | Freq: Every day | ORAL | Status: DC

## 2011-08-16 MED ORDER — ONDANSETRON HCL 4 MG/2ML IJ SOLN: 4.0000 mg | Freq: Four times a day (QID) | INTRAMUSCULAR | Status: DC | PRN

## 2011-08-16 MED ORDER — NITROGLYCERIN 0.4 MG SL SUBL: 0.4000 mg | SUBLINGUAL_TABLET | SUBLINGUAL | Status: DC | PRN

## 2011-08-16 MED ORDER — BIVALIRUDIN 250 MG IV SOLR
INTRAVENOUS | Status: AC
  Filled 2011-08-16: qty 250

## 2011-08-16 MED ORDER — NITROGLYCERIN 0.2 MG/ML ON CALL CATH LAB
INTRAVENOUS | Status: AC
  Filled 2011-08-16: qty 1

## 2011-08-16 MED ORDER — FENTANYL CITRATE 0.05 MG/ML IJ SOLN
INTRAMUSCULAR | Status: AC
  Filled 2011-08-16: qty 2

## 2011-08-16 MED ORDER — ACETAMINOPHEN 325 MG PO TABS: 650.0000 mg | ORAL_TABLET | ORAL | Status: DC | PRN

## 2011-08-16 MED ORDER — PANTOPRAZOLE SODIUM 40 MG PO TBEC
40.0000 mg | DELAYED_RELEASE_TABLET | Freq: Every day | ORAL | Status: DC
  Administered 2011-08-17: 40 mg via ORAL
  Filled 2011-08-16: qty 1

## 2011-08-16 MED ORDER — ASPIRIN 81 MG PO CHEW
81.0000 mg | CHEWABLE_TABLET | Freq: Every day | ORAL | Status: DC
  Administered 2011-08-16 – 2011-08-17 (×2): 81 mg via ORAL
  Filled 2011-08-16 (×2): qty 1

## 2011-08-16 MED ORDER — SODIUM CHLORIDE 0.9 % IJ SOLN
3.0000 mL | Freq: Two times a day (BID) | INTRAMUSCULAR | Status: DC
  Administered 2011-08-16 – 2011-08-17 (×3): 3 mL via INTRAVENOUS

## 2011-08-16 MED ORDER — HEPARIN (PORCINE) IN NACL 2-0.9 UNIT/ML-% IJ SOLN
INTRAMUSCULAR | Status: AC
  Filled 2011-08-16: qty 2000

## 2011-08-16 MED ORDER — SODIUM CHLORIDE 0.9 % IV SOLN: 250.0000 mL | INTRAVENOUS | Status: DC | PRN

## 2011-08-16 MED ORDER — ALPRAZOLAM 0.25 MG PO TABS
0.2500 mg | ORAL_TABLET | Freq: Two times a day (BID) | ORAL | Status: DC | PRN
  Administered 2011-08-16 (×2): 0.25 mg via ORAL
  Filled 2011-08-16: qty 1

## 2011-08-16 MED ORDER — MORPHINE SULFATE 4 MG/ML IJ SOLN
INTRAMUSCULAR | Status: AC
  Filled 2011-08-16: qty 1

## 2011-08-16 MED ORDER — ALPRAZOLAM 0.25 MG PO TABS
ORAL_TABLET | ORAL | Status: AC
  Filled 2011-08-16: qty 1

## 2011-08-16 MED ORDER — MORPHINE SULFATE 4 MG/ML IJ SOLN
1.0000 mg | INTRAMUSCULAR | Status: DC | PRN
  Administered 2011-08-16 (×2): 1 mg via INTRAVENOUS
  Filled 2011-08-16: qty 1

## 2011-08-16 MED ORDER — ATORVASTATIN CALCIUM 10 MG PO TABS
10.0000 mg | ORAL_TABLET | Freq: Every day | ORAL | Status: DC
  Administered 2011-08-16: 10 mg via ORAL
  Filled 2011-08-16 (×3): qty 1

## 2011-08-16 MED ORDER — INFLUENZA VIRUS VACC SPLIT PF IM SUSP
0.5000 mL | INTRAMUSCULAR | Status: AC
  Administered 2011-08-17: 0.5 mL via INTRAMUSCULAR
  Filled 2011-08-16: qty 0.5

## 2011-08-16 MED ORDER — PNEUMOCOCCAL VAC POLYVALENT 25 MCG/0.5ML IJ INJ
0.5000 mL | INJECTION | INTRAMUSCULAR | Status: AC
  Administered 2011-08-17: 0.5 mL via INTRAMUSCULAR
  Filled 2011-08-16: qty 0.5

## 2011-08-16 MED ORDER — ZOLPIDEM TARTRATE 5 MG PO TABS: 10.0000 mg | ORAL_TABLET | Freq: Every evening | ORAL | Status: DC | PRN

## 2011-08-16 MED ORDER — GI COCKTAIL ~~LOC~~
30.0000 mL | Freq: Once | ORAL | Status: AC
  Administered 2011-08-16: 30 mL via ORAL
  Filled 2011-08-16: qty 30

## 2011-08-16 MED ORDER — LIDOCAINE HCL (PF) 1 % IJ SOLN
INTRAMUSCULAR | Status: AC
  Filled 2011-08-16: qty 30

## 2011-08-16 MED ORDER — ALUM & MAG HYDROXIDE-SIMETH 200-200-20 MG/5ML PO SUSP
15.0000 mL | Freq: Four times a day (QID) | ORAL | Status: DC | PRN
  Administered 2011-08-16 – 2011-08-17 (×2): 15 mL via ORAL
  Filled 2011-08-16 (×2): qty 30

## 2011-08-16 MED ORDER — SODIUM CHLORIDE 0.9 % IJ SOLN: 3.0000 mL | INTRAMUSCULAR | Status: DC | PRN

## 2011-08-16 NOTE — Progress Notes (Signed)
Pt noticed significant relief after GI cocktail. Pt denies having any pain at this point. Will continue to monitor

## 2011-08-16 NOTE — CV Procedure (Signed)
PROCEDURE:  Left heart catheterization with selective coronary angiography, left ventriculogram.  PRIMARY CARDIOLOGIST:  Dr. Jones Broom  INDICATIONS:  Possible MI, abnormal ECG  The patient was brought emergently to the cath lab.  Implied consent due to possible inferior STEMI.  PROCEDURE TECHNIQUE:  After Xylocaine anesthesia a 34F sheath was placed in the right femoral artery with a single anterior needle wall stick.   Left coronary angiography was done using a Judkins L4 guide catheter.  Right coronary angiography was done using a Judkins R4 guide catheter.  Left ventriculography was done using a pigtail catheter.  Manual compression for hemostasis.   CONTRAST:  Total of 65 cc.  COMPLICATIONS:  None.    HEMODYNAMICS:  Aortic pressure was 83/58; LV pressure was 84/2; LVEDP 9.  There was no gradient between the left ventricle and aorta.    ANGIOGRAPHIC DATA:   The left main coronary artery is widely patent.  The left anterior descending artery is a large vessel which wraps around the apex.  There is a tubular 40-50% stenosis in the mid vessel.  The left circumflex artery is small, but patent.  Large ramus vessel with no significant atherosclerosis.  The right coronary artery is a medium sized vessel.  The PDA and PLA are small but patent.Marland Kitchen  LEFT VENTRICULOGRAM:  Left ventricular angiogram was done in the 30 RAO projection and revealed normal left ventricular wall motion and systolic function with an estimated ejection fraction of 55%.  LVEDP was 9 mmHg.  IMPRESSIONS:  1. Widely patent left main coronary artery. 2. Mild atherosclerosis in the mid left anterior descending artery. 3. No significant disease in the circumflex artery and its branches, ramus or RCA. 4. Normal left ventricular systolic function.  LVEDP 9 mmHg.  Ejection fraction 55%.  RECOMMENDATION:  No change since the prior cath.  Continue medical therapy.  Patient will followup with [facility] cardiology.

## 2011-08-16 NOTE — H&P (Signed)
Patient ID: [name] [name] MRN: [id number], DOB/AGE: [birth year]   Admit date: 08/16/2011   Primary Physician: Dala Dock Primary Cardiologist: D. Bensimhon, MD  Pt. Profile:  46 y/o male with h/o c/p and nonobs cath in 2009, who presented with chest pain and inflat st elevation.  Problem List  Past Medical History  Diagnosis Date  . Chest pain, mid sternal   . CAD in native artery     a. Nonobstructive cath 11/2007;  b. Nonobstructive cath 08/2011  . GERD (gastroesophageal reflux disease)   . Tobacco abuse   . Marijuana abuse     a. uses ~ 1x /wk or less  . History of cocaine abuse     a. quit ? 2009  . History of ETOH abuse     a. drinks 2 "40's" / wk  . Bradycardia     a. asymptomatic  . Bipolar disorder   . Syncope     a. 12/2010 - presumed to be vasovagal    No past surgical history on file.   Allergies  Allergies no known allergies  HPI  46 y/o male with the above problem list.  He is s/p cath in the setting of c/p in 2009, which showed nonobstructive CAD.  He has not followed up with [place] since.  He was in his usoh until a few weeks ago when he began to experience intermittent, sharp, left sided chest pain w/o assoc Ss.  Early this am, around midnight, he awoke with severe chest heaviness, "like and elephant was sitting on my chest," assoc with nausea.  He took no meds @ home and Ss eased off enough for him to doze back to sleep, though he was awakened frequently by recurrent worsening of discomfort.  This AM, he called 911, and his ECG showed inflat st elevation concerning for STEMI.  Code STEMI was activated and pt was taken to the [facility] cath lab for emergent diagnostic cath, which has shown nonobstructive CAD with NL LV fxn.  He cont to report 2/10 chest heaviness.  Home Medications  Prior to Admission medications   Not on File    Family History  Both parents are alive and well.  He has 11 brothers and sisters.  His youngest sister has a pacemaker.  Social  History  History   Social History  . Marital Status: Single    Spouse Name: N/A    Number of Children: N/A  . Years of Education: N/A   Occupational History  . Not on file.   Social History Main Topics  . Smoking status: Current Everyday Smoker -- 8 years    Types: Cigarettes  . Smokeless tobacco: Not on file   Comment: Smokes about 3 cigarettes/day  . Alcohol Use: Yes     drinks 2, 40 oz beers/wk  . Drug Use: Yes     Uses Marijuana about 1x/wk.  Prev used crack cocaine - none since ~ 2009  . Sexually Active: Not on file   Other Topics Concern  . Not on file   Social History Narrative   Lives in [place] by himself.  Does not routinely exercise.     Review of Systems General:  No chills, fever, night sweats or weight changes.  Cardiovascular:  ++ chest pain assoc w/ Nausea as outlined above.  No dyspnea on exertion, edema, orthopnea, palpitations, paroxysmal nocturnal dyspnea. Dermatological: No rash, lesions/masses Respiratory: No cough, dyspnea Urologic: No hematuria, dysuria Abdominal:   No vomiting, diarrhea, bright red blood  per rectum, melena, or hematemesis Neurologic:  No visual changes, wkns, changes in mental status. All other systems reviewed and are otherwise negative except as noted above.  Physical Exam  There were no vitals taken for this visit.  General: Pleasant, NAD Psych:  Flat affect - groggy. Neuro: Alert and oriented X 3. Moves all extremities spontaneously. HEENT: Normal  Neck: Supple without bruits or JVD. Lungs:  Resp regular and unlabored, scatt rhonchi. Heart: RRR no s3, s4, or murmurs. Abdomen: Soft, non-tender, non-distended, BS + x 4.  Extremities: No clubbing, cyanosis or edema. DP/PT/Radials 2+ and equal bilaterally.  Labs  CE pending  Lab Results  Component Value Date   WBC 4.7 08/16/2011   HGB 14.0 08/16/2011   HCT 39.5 08/16/2011   MCV 87.0 08/16/2011   PLT 179 08/16/2011    BMET pending   Radiology/Studies  cxr  pending.  ECG  Rsr, approx 1mm st elevation II, III, aVF, V4-V6.  ASSESSMENT AND PLAN  1.  Chest pain/nonobstructive CAD:  Pt presented via EMS with ST elevation concerning for active injury, however cath has shown nonobstructive dzs.  We will admit to tele for further w/u of noncardiac c/p.  Cycle ce, check d-dimer.  Add ppi.  2.  Bipolar d/o:  Will need a list of his home meds - resume.  3.  Tob & marijuan abuse:  Will need formal cessation counseling.  4.  GERD:  Add PPI.  Signed, Nicolasa Ducking, NP 08/16/2011, 10:24 AM  Patient seen and examined.  Agree with findings of C Berge. Patient a 46 year old with moderate CAD.  PResents with increasing episodses of CP.  This spell hasbeen continuous since midnight.  Baseline EKG with J point elevation laterally.  Worse with inferior changes today.  On exam:  Neck:  JVP normal.  LUngs:  CTA  Cardiac  RRR.  No S3.  No murmurs.  Ext No edema.   I would recomm L heart cath to define anatomy.   Patient agrees to proceed. Counselled on tobacco.  Dietrich Pates 08/16/2011   Dietrich Pates 08/16/2011

## 2011-08-16 NOTE — Progress Notes (Signed)
Removed dressing on cath site at right groin. No bleeding noted. Pt's bedrest period is over. Applied bandaid to site

## 2011-08-16 NOTE — Progress Notes (Signed)
Chaplain's Note:  09:35am-10:00am  Responded to Code Stemi.  Pt alert to staff.  Offered pastoral presence to staff and pt.  Pt stated no family coming at this time.  Asked staff to page if support could be provided.

## 2011-08-17 ENCOUNTER — Encounter (HOSPITAL_COMMUNITY): Payer: Self-pay | Admitting: Nurse Practitioner

## 2011-08-17 DIAGNOSIS — R9431 Abnormal electrocardiogram [ECG] [EKG]: Secondary | ICD-10-CM | POA: Insufficient documentation

## 2011-08-17 LAB — CARDIAC PANEL(CRET KIN+CKTOT+MB+TROPI)
CK, MB: 1.5 ng/mL (ref 0.3–4.0)
Relative Index: 0.4 (ref 0.0–2.5)
Troponin I: <0.3 ng/mL (ref <0.30)

## 2011-08-17 LAB — BASIC METABOLIC PANEL
CO2: 26 mEq/L (ref 19–32)
Calcium: 8.5 mg/dL (ref 8.4–10.5)
Creatinine, Ser: 1.11 mg/dL (ref 0.50–1.35)
GFR calc non Af Amer: 77 mL/min — ABNORMAL LOW (ref >90)

## 2011-08-17 MED ORDER — ASPIRIN 81 MG PO CHEW: 81.0000 mg | CHEWABLE_TABLET | Freq: Every day | ORAL | Status: DC

## 2011-08-17 MED ORDER — ATORVASTATIN CALCIUM 10 MG PO TABS: 10.0000 mg | ORAL_TABLET | Freq: Every day | ORAL | Status: DC

## 2011-08-17 NOTE — Progress Notes (Signed)
Discharge instructions given to pt. Pt verbalized understanding. Removed IV no bleeding, pressure held. D/Ced telemetry.

## 2011-08-17 NOTE — Progress Notes (Signed)
Patient ID: [name] [name], male   DOB: 18-Apr-1966, 46 y.o.   MRN: [id number] @ Subjective:  Denies SSCP, palpitations or Dyspnea   Objective:  Filed Vitals:   08/16/11 1421 08/16/11 2055 08/17/11 0450 08/17/11 0612  BP:  101/62 94/59   Pulse:  45 44   Temp:  97.5 F (36.4 C) 97 F (36.1 C)   TempSrc:  Oral Oral   Resp:  18 18   Height: 5\' 4"  (1.626 m)     Weight: 55 kg (121 lb 4.1 oz)   53.8 kg (118 lb 9.7 oz)  SpO2:  97% 94%     Intake/Output from previous day:  Intake/Output Summary (Last 24 hours) at 08/17/11 0817 Last data filed at 08/16/11 1900  Gross per 24 hour  Intake    360 ml  Output      0 ml  Net    360 ml    Physical Exam: Affect appropriate Thin black male HEENT: normal Neck supple with no adenopathy JVP normal no bruits no thyromegaly Lungs clear with no wheezing and good diaphragmatic motion Heart:  S1/S2 no murmur, no rub, gallop or click PMI normal Abdomen: benighn, BS positve, no tenderness, no AAA no bruit.  No HSM or HJR Distal pulses intact with no bruits No edema Neuro non-focal Skin warm and dry No muscular weakness   Lab Results: Basic Metabolic Panel:  Basename 08/17/11 0232 08/16/11 1537 08/16/11 1000  NA 135 -- 139  K 3.8 -- 3.9  CL 103 -- 106  CO2 26 -- 20  GLUCOSE 90 -- 76  BUN 7 -- 8  CREATININE 1.11 -- 1.05  CALCIUM 8.5 -- 8.4  MG -- 1.7 --  PHOS -- -- --   Liver Function Tests:  Basename 08/16/11 1000  AST 17  ALT 9  ALKPHOS 76  BILITOT 0.5  PROT 6.4  ALBUMIN 3.4*   No results found for this basename: LIPASE:2,AMYLASE:2 in the last 72 hours CBC:  Basename 08/16/11 1000  WBC 4.7  NEUTROABS --  HGB 14.0  HCT 39.5  MCV 87.0  PLT 179   Cardiac Enzymes:  Basename 08/17/11 0232 08/16/11 2038 08/16/11 1537  CKTOTAL 363* 426* 949*  CKMB 1.5 1.5 2.4  CKMBINDEX -- -- --  TROPONINI <0.30 <0.30 <0.30   BNP: No components found with this basename: POCBNP:3 D-Dimer:  Basename 08/16/11 1537  DDIMER 0.28    Hemoglobin A1C:  Basename 08/16/11 1000  HGBA1C 5.1   Fasting Lipid Panel:  Basename 08/16/11 1000  CHOL 163  HDL 71  LDLCALC 66  TRIG 129  CHOLHDL 2.3  LDLDIRECT --   Thyroid Function Tests:  Basename 08/16/11 1537  TSH 1.095  T4TOTAL --  T3FREE --  THYROIDAB --   Anemia Panel: No results found for this basename: VITAMINB12,FOLATE,FERRITIN,TIBC,IRON,RETICCTPCT in the last 72 hours  Imaging: Portable Chest X-ray 1 View  08/16/2011  *RADIOLOGY REPORT*  Clinical Data: Chest pain.  PORTABLE CHEST - 1 VIEW  Comparison: 12/24/2010  Findings: Heart size and vascularity are normal and the lungs are clear.  No significant osseous abnormality. Normal variant of an azygos fissure.  IMPRESSION: Normal chest.  Original Report Authenticated By: Gwynn Burly, M.D.    Cardiac Studies:  ECG:  SR rate 50 early repol  No acute changes   Telemetry:  NSR no arrhythmia  Echo:   Medications:     . aspirin  81 mg Oral Daily  . atorvastatin  10 mg Oral q1800  .  fentaNYL      . gi cocktail  30 mL Oral Once  . heparin      . influenza  inactive virus vaccine  0.5 mL Intramuscular Tomorrow-1000  . lidocaine      . midazolam      . nitroGLYCERIN      . pantoprazole  40 mg Oral Q0600  . pneumococcal 23 valent vaccine  0.5 mL Intramuscular Tomorrow-1000  . sodium chloride  3 mL Intravenous Q12H  . DISCONTD: aspirin EC  81 mg Oral Daily       . sodium chloride 75 mL/hr at 08/16/11 1520    Assessment/Plan:  Chest Pain:  No significant disease on cath.  CXR normal.  D/C home F/U [facility] Chol:  Continue statin GERD:  Continue pantoprazole  Charlton Haws 08/17/2011, 8:17 AM

## 2011-08-17 NOTE — Discharge Summary (Signed)
Patient ID: [name] [name],  MRN: [id number], DOB/AGE: 1966/02/10 46 y.o.  Admit date: 08/16/2011 Discharge date: 08/17/2011  Primary Care Provider: No primary provider on file. Primary Cardiologist: D. Bensimhon, MD  Discharge Diagnoses Principal Problem:  *Chest pain, mid sternal Active Problems:  CAD in native artery  GERD (gastroesophageal reflux disease)  Tobacco abuse  Marijuana abuse  History of cocaine abuse  History of ETOH abuse  Bipolar disorder  Abnormal ECG   Allergies No Known Allergies  Procedures  Cardiac Catheterization 08/16/2011  HEMODYNAMICS:  Aortic pressure was 83/58; LV pressure was 84/2; LVEDP 9.  There was no gradient between the left ventricle and aorta.    ANGIOGRAPHIC DATA:   The left main coronary artery is widely patent.  The left anterior descending artery is a large vessel which wraps around the apex.  There is a tubular 40-50% stenosis in the mid vessel.  The left circumflex artery is small, but patent.  Large ramus vessel with no significant atherosclerosis.  The right coronary artery is a medium sized vessel.  The PDA and PLA are small but patent.Marland Kitchen  LEFT VENTRICULOGRAM:  Left ventricular angiogram was done in the 30 RAO projection and revealed normal left ventricular wall motion and systolic function with an estimated ejection fraction of 55%.  LVEDP was 9 mmHg. ____________________________________  History of Present Illness  46 y/o male with the above problem list. He is s/p cath in the setting of c/p in 2009, which showed nonobstructive CAD. He has not followed up with [place] since. He was in his usoh until a few weeks ago when he began to experience intermittent, sharp, left sided chest pain w/o assoc Ss.   Early the am of admission, around midnight, he awoke with severe chest heaviness, "like and elephant was sitting on my chest," assoc with nausea. He took no meds @ home and Ss eased off enough for him to doze back to sleep, though he was  awakened frequently by recurrent worsening of discomfort.  Symptoms persisted after sunrise and he called 911  His ECG showed inflat st elevation concerning for STEMI. Code STEMI was activated and pt was taken to the [facility] cath lab for emergent diagnostic catheterization.  Hospital Course  Diagnostic catheterization revealed nonobstructive coronary artery disease similar to his last angiogram. He continued to have chest discomfort which was successfully treated with GI cocktail. He was transferred to the floor where he subsequently ruled out for MI. His d-dimer and chest x-ray were also normal. This morning, he has been ambulating without recurrent symptoms or limitations. He will be discharged home today in good condition and we have recommended antacid therapy and followup with [facility].  Discharge Vitals Blood pressure 94/59, pulse 44, temperature 97 F (36.1 C), temperature source Oral, resp. rate 18, height 5\' 4"  (1.626 m), weight 118 lb 9.7 oz (53.8 kg), SpO2 94.00%.  Filed Weights   08/16/11 1421 08/17/11 0612  Weight: 121 lb 4.1 oz (55 kg) 118 lb 9.7 oz (53.8 kg)   Labs  CBC  Basename 08/16/11 1000  WBC 4.7  NEUTROABS --  HGB 14.0  HCT 39.5  MCV 87.0  PLT 179   Basic Metabolic Panel  Basename 08/17/11 0232 08/16/11 1537 08/16/11 1000  NA 135 -- 139  K 3.8 -- 3.9  CL 103 -- 106  CO2 26 -- 20  GLUCOSE 90 -- 76  BUN 7 -- 8  CREATININE 1.11 -- 1.05  CALCIUM 8.5 -- 8.4  MG -- 1.7 --  PHOS -- -- --   Liver Function Tests  Basename 08/16/11 1000  AST 17  ALT 9  ALKPHOS 76  BILITOT 0.5  PROT 6.4  ALBUMIN 3.4*   Cardiac Enzymes  Basename 08/17/11 0232 08/16/11 2038 08/16/11 1537  CKTOTAL 363* 426* 949*  CKMB 1.5 1.5 2.4  CKMBINDEX -- -- --  TROPONINI <0.30 <0.30 <0.30   D-Dimer  Basename 08/16/11 1537  DDIMER 0.28   Hemoglobin A1C  Basename 08/16/11 1000  HGBA1C 5.1   Fasting Lipid Panel  Basename 08/16/11 1000  CHOL 163  HDL 71  LDLCALC 66    TRIG 129  CHOLHDL 2.3  LDLDIRECT --   Thyroid Function Tests  Basename 08/16/11 1537  TSH 1.095  T4TOTAL --  T3FREE --  THYROIDAB --    Disposition  Pt is being discharged home today in good condition.  Follow-up Plans & Appointments  Follow-up Information    Follow up with [facility]. (1-2 WKS)          Discharge Medications  Medication List  As of 08/17/2011 10:30 AM   TAKE these medications         aspirin 81 MG chewable tablet   Chew 1 tablet (81 mg total) by mouth daily.      atorvastatin 10 MG tablet   Commonly known as: LIPITOR   Take 1 tablet (10 mg total) by mouth daily at 6 PM.      ranitidine 150 MG tablet   Commonly known as: ZANTAC   Take 150 mg by mouth daily.           Outstanding Labs/Studies  None  Duration of Discharge Encounter   Greater than 30 minutes including physician time.  Signed, Nicolasa Ducking NP 08/17/2011, 10:30 AM

## 2011-08-17 NOTE — Discharge Instructions (Signed)

## 2011-08-18 LAB — POCT I-STAT, CHEM 8
Creatinine, Ser: 1.1 mg/dL (ref 0.50–1.35)
Glucose, Bld: 78 mg/dL (ref 70–99)
Hemoglobin: 15 g/dL (ref 13.0–17.0)
Sodium: 139 mEq/L (ref 135–145)
TCO2: 21 mmol/L (ref 0–100)

## 2011-08-18 LAB — POCT I-STAT 3, ART BLOOD GAS (G3+)
Acid-base deficit: 3 mmol/L — ABNORMAL HIGH (ref 0.0–2.0)
Bicarbonate: 19.8 mEq/L — ABNORMAL LOW (ref 20.0–24.0)
O2 Saturation: 96 %
TCO2: 21 mmol/L (ref 0–100)

## 2011-09-19 ENCOUNTER — Emergency Department (HOSPITAL_COMMUNITY): Payer: Medicaid Other

## 2011-09-19 ENCOUNTER — Emergency Department (HOSPITAL_COMMUNITY)
Admission: EM | Admit: 2011-09-19 | Discharge: 2011-09-19 | Disposition: A | Discharge status: Home / Self Care | Payer: Medicaid Other | Attending: Emergency Medicine | Admitting: Emergency Medicine

## 2011-09-19 ENCOUNTER — Encounter (HOSPITAL_COMMUNITY): Payer: Self-pay | Admitting: Emergency Medicine

## 2011-09-19 DIAGNOSIS — K219 Gastro-esophageal reflux disease without esophagitis: Secondary | ICD-10-CM | POA: Insufficient documentation

## 2011-09-19 DIAGNOSIS — Z7982 Long term (current) use of aspirin: Secondary | ICD-10-CM | POA: Insufficient documentation

## 2011-09-19 DIAGNOSIS — I2510 Atherosclerotic heart disease of native coronary artery without angina pectoris: Secondary | ICD-10-CM | POA: Insufficient documentation

## 2011-09-19 DIAGNOSIS — R079 Chest pain, unspecified: Secondary | ICD-10-CM | POA: Insufficient documentation

## 2011-09-19 HISTORY — DX: Atherosclerotic heart disease of native coronary artery without angina pectoris: I25.10

## 2011-09-19 LAB — URINALYSIS, ROUTINE W REFLEX MICROSCOPIC
Bilirubin Urine: NEGATIVE
Nitrite: NEGATIVE
Protein, ur: NEGATIVE mg/dL
Specific Gravity, Urine: 1.016 (ref 1.005–1.030)
Urobilinogen, UA: 1 mg/dL (ref 0.0–1.0)

## 2011-09-19 LAB — POCT I-STAT, CHEM 8
BUN: 14 mg/dL (ref 6–23)
Calcium, Ion: 1.16 mmol/L (ref 1.12–1.32)
Creatinine, Ser: 1.1 mg/dL (ref 0.50–1.35)
Hemoglobin: 15.3 g/dL (ref 13.0–17.0)
Sodium: 136 mEq/L (ref 135–145)
TCO2: 21 mmol/L (ref 0–100)

## 2011-09-19 LAB — PRO B NATRIURETIC PEPTIDE: Pro B Natriuretic peptide (BNP): 34.9 pg/mL (ref 0–125)

## 2011-09-19 LAB — CBC
HCT: 40.4 % (ref 39.0–52.0)
Hemoglobin: 14.5 g/dL (ref 13.0–17.0)
MCH: 31 pg (ref 26.0–34.0)
MCHC: 35.9 g/dL (ref 30.0–36.0)
MCV: 86.3 fL (ref 78.0–100.0)
Platelets: 170 K/uL (ref 150–400)
RBC: 4.68 MIL/uL (ref 4.22–5.81)
RDW: 13.5 % (ref 11.5–15.5)
WBC: 5.6 K/uL (ref 4.0–10.5)

## 2011-09-19 LAB — COMPREHENSIVE METABOLIC PANEL
ALT: 12 U/L (ref 0–53)
AST: 19 U/L (ref 0–37)
Calcium: 9.2 mg/dL (ref 8.4–10.5)
Creatinine, Ser: 1.05 mg/dL (ref 0.50–1.35)
GFR calc Af Amer: >90 mL/min (ref >90)
Sodium: 132 mEq/L — ABNORMAL LOW (ref 135–145)
Total Protein: 7.1 g/dL (ref 6.0–8.3)

## 2011-09-19 LAB — PROTIME-INR
INR: 1.15 (ref 0.00–1.49)
Prothrombin Time: 14.9 s (ref 11.6–15.2)

## 2011-09-19 LAB — APTT: aPTT: 33 s (ref 24–37)

## 2011-09-19 LAB — POCT I-STAT TROPONIN I

## 2011-09-19 MED ORDER — LANSOPRAZOLE 30 MG PO CPDR: 30.0000 mg | DELAYED_RELEASE_CAPSULE | Freq: Every day | ORAL | Status: DC

## 2011-09-19 MED ORDER — MORPHINE SULFATE 4 MG/ML IJ SOLN
4.0000 mg | Freq: Once | INTRAMUSCULAR | Status: AC
  Administered 2011-09-19: 4 mg via INTRAVENOUS
  Filled 2011-09-19: qty 1

## 2011-09-19 MED ORDER — GI COCKTAIL ~~LOC~~
30.0000 mL | Freq: Once | ORAL | Status: AC
  Administered 2011-09-19: 30 mL via ORAL
  Filled 2011-09-19: qty 30

## 2011-09-19 MED ORDER — OXYCODONE-ACETAMINOPHEN 5-325 MG PO TABS
2.0000 | ORAL_TABLET | Freq: Once | ORAL | Status: AC
  Administered 2011-09-19: 2 via ORAL
  Filled 2011-09-19: qty 2

## 2011-09-19 NOTE — Discharge Instructions (Signed)
Your caregiver has diagnosed you as having chest pain that is nonspecific for one problem. This means that after looking at you and examining you and ordering tests (such as blood work, chest x-rays and EKG), your caregiver does not believe that the problem is serious enough to need watching in the hospital. This judgment is often made after testing shows no acute heart attack and you are at low risk for sudden acute heart condition. Chest pain comes from many different causes.  Seek immediate medical attention if:   You have severe chest pain, especially if the pain is crushing or pressure-like and spreads to the arms, back, neck, or jaw, or if you have sweating, nausea, shortness of breath. This is an emergency. Don't wait to see if the pain will go away. Get medical help at once. Call 911 immediately. Do not drive herself to the hospital.   Your chest pain gets worse and does not go away with rest.   You have an attack of chest pain lasting longer than usual, despite rest and treatment with the medications your caregiver has prescribed   You awaken from sleep with chest pain or shortness of breath.   You feel faint or dizzy   You have chest pain not typical of your usual pain for which you originally saw your caregiver.  You must have a repeat evaluation within 24 hours for a recheck of your heart.  Please call your doctor this morning to schedule this appointment. If you do not have a family doctor, please see the list of doctors below.  RESOURCE GUIDE  Dental Problems  Patients with Medicaid: [facility]                     [facility] [address]                                           [address] Phone:  [phone]                                                  Phone:  [phone]  If unable to pay or uninsured, contact:  [facility] or [facility] to become qualified for the adult dental clinic.  Chronic Pain  Problems Contact [facility]  [phone] Patients need to be referred by their primary care doctor.  Insufficient Money for Medicine Contact [employer]:  call "211" or [facility] 271-5999.  No Primary Care Doctor Call [facility]  832-8000 Other agencies that provide inexpensive medical care    [facility]  [phone]    [facility]  [phone]    [facility]  [phone]    [facility]  [phone]    [facility]  [phone]    [facility]  [phone]  Psychological Services [facility]  [phone] [facility]  [phone] [facility]   800 853-5163 (emergency services 641-4993)  Substance Abuse Resources [facility]  336-882-2125 [facility] 336-784-9470 [facility] 336-285-9073 [facility] 336-845-3988 Residential & Outpatient Substance Abuse Program  800-659-3381  Abuse/Neglect [place] Child Abuse Hotline (336) 641-3795 [place] [facility] Abuse Hotline 800-378-5315 (After Hours)  Emergency Shelter [facility] (336) 271-5985  [facility] (336) 275-9566 [facility] (704) 372-4663  MRSA Hotline #:   832-7006    [place] [facility]  [facility] [place]     [employer]                          [facility] [address] [place]                       [address]      [address]  [place]                                                [place]                            [place] Phone:  [phone]                                   Phone:  [phone]                 Phone:  [phone]  [facility] Phone:  [phone]  [place] Child Abuse Hotline (336) 342-1394 (336) 342-3537 (After Hours)    

## 2011-09-19 NOTE — ED Notes (Signed)
Pt is on zantac and aspirin c/o chest pain; epigastric pain starting a couple of days ago. Denies SOB, V, diaphoresis. PT reports he is nauseated and had stent 2 weeks ago.

## 2011-09-19 NOTE — ED Provider Notes (Addendum)
History     CSN: [id number]  Arrival date & time 09/19/11  0229   First MD Initiated Contact with Patient 09/19/11 0326      Chief Complaint  Patient presents with  . Chest Pain    (Consider location/radiation/quality/duration/timing/severity/associated sxs/prior treatment) HPI Comments: 46 year old male with a history of recent admission to the hospital for chest pain which ended up not being cardiac in etiology. He had a nonobstructive heart catheterization one month ago. He states that over the last week he has had a fairly persistent epigastric and lower chest pain which is burning and aching, worse at night and worse in the supine position.  This is improved temporarily when he uses Zantac medication but seems to come back when the medication wears off. He denies associated shortness of breath, swelling, fever, cough, shortness of breath.  Patient is a 46 y.o. male presenting with chest pain. The history is provided by the patient and medical records.  Chest Pain     Past Medical History  Diagnosis Date  . Chest pain, mid sternal   . CAD in native artery     a. Nonobstructive cath 11/2007;  b. Presented with ST elevation - Nonobstructive cath 08/2011  . GERD (gastroesophageal reflux disease)   . Tobacco abuse   . Marijuana abuse     a. uses ~ 1x /wk or less  . History of cocaine abuse     a. quit ? 2009  . History of ETOH abuse     a. drinks 2 "40's" / wk  . Bradycardia     a. asymptomatic  . Bipolar disorder   . Syncope     a. 12/2010 - presumed to be vasovagal  . Abnormal ECG     a. early repolarization  . Coronary artery disease     History reviewed. No pertinent past surgical history.  No family history on file.  History  Substance Use Topics  . Smoking status: Current Everyday Smoker -- 8 years    Types: Cigarettes  . Smokeless tobacco: Not on file   Comment: Smokes about 3 cigarettes/day  . Alcohol Use: Yes     drinks 2, 40 oz beers/wk      Review  of Systems  Cardiovascular: Positive for chest pain.  All other systems reviewed and are negative.    Allergies  Review of patient's allergies indicates no known allergies.  Home Medications   Current Outpatient Rx  Name Route Sig Dispense Refill  . ACETAMINOPHEN 500 MG PO TABS Oral Take 1,000 mg by mouth every 6 (six) hours as needed. For pain    . NITROGLYCERIN 0.4 MG SL SUBL Sublingual Place 0.4 mg under the tongue every 5 (five) minutes as needed. For chest pain    . RANITIDINE HCL 150 MG PO TABS Oral Take 150 mg by mouth daily.    . ASPIRIN 81 MG PO CHEW Oral Chew 324 mg by mouth once.    Marland Kitchen LANSOPRAZOLE 30 MG PO CPDR Oral Take 1 capsule (30 mg total) by mouth daily. 30 capsule 1    BP 113/55  Pulse 47  Temp(Src) 97.9 F (36.6 C) (Oral)  Resp 15  SpO2 98%  Physical Exam  Nursing note and vitals reviewed. Constitutional: He appears well-developed and well-nourished. No distress.  HENT:  Head: Normocephalic and atraumatic.  Mouth/Throat: Oropharynx is clear and moist. No oropharyngeal exudate.  Eyes: Conjunctivae and EOM are normal. Pupils are equal, round, and reactive to light. Right eye exhibits  no discharge. Left eye exhibits no discharge. No scleral icterus.  Neck: Normal range of motion. Neck supple. No JVD present. No thyromegaly present.  Cardiovascular: Normal rate, regular rhythm, normal heart sounds and intact distal pulses.  Exam reveals no gallop and no friction rub.   No murmur heard. Pulmonary/Chest: Effort normal and breath sounds normal. No respiratory distress. He has no wheezes. He has no rales.  Abdominal: Soft. Bowel sounds are normal. He exhibits no distension and no mass. There is no tenderness.  Musculoskeletal: Normal range of motion. He exhibits no edema and no tenderness.  Lymphadenopathy:    He has no cervical adenopathy.  Neurological: He is alert. Coordination normal.  Skin: Skin is warm and dry. No rash noted. No erythema.  Psychiatric: He  has a normal mood and affect. His behavior is normal.    ED Course  Procedures (including critical care time)  ED ECG REPORT   Date: 09/19/2011   Rate: 57  Rhythm: sinus bradycardia  QRS Axis: normal  Intervals: normal  ST/T Wave abnormalities: early repolarization  Conduction Disutrbances:none  Narrative Interpretation:   Old EKG Reviewed: unchanged   Labs Reviewed  COMPREHENSIVE METABOLIC PANEL - Abnormal; Notable for the following:    Sodium 132 (*)    GFR calc non Af Amer 83 (*)    All other components within normal limits  URINALYSIS, ROUTINE W REFLEX MICROSCOPIC - Abnormal; Notable for the following:    APPearance CLOUDY (*)    Ketones, ur 40 (*)    All other components within normal limits  POCT I-STAT, CHEM 8 - Abnormal; Notable for the following:    Glucose, Bld 68 (*)    All other components within normal limits  PRO B NATRIURETIC PEPTIDE  CBC  MAGNESIUM  PROTIME-INR  APTT  LIPASE, BLOOD  POCT I-STAT TROPONIN I   No results found.   1. Chest pain       MDM  Patient likely has acid reflux, check lipase to rule out pancreatitis, EKG is completely unchanged and shows early repolarization in the anterior leads but sinus bradycardia and exactly the same as prior EKG from 08/16/2011. Heart catheterization report reviewed showing no signs of obstructive coronary disease.  Labs Are totally normal, EKG is nonischemic, troponin is negative, improved with medications to treat stomach acid. We'll start on him on proton pump inhibitor  Discharge Prescriptions include:  Prevacid   Pt was informed of his results, Percocet given for pain as he did have some residual pain and explained need for strict dietary control to reduce acid.   Vida Roller, MD 09/19/11 [id number]  Vida Roller, MD 09/19/11 [phone]

## 2011-09-19 NOTE — ED Notes (Signed)
PT has had nitro and aspirin.

## 2011-09-20 NOTE — ED Notes (Signed)
percription for Omeprazole given by Felicie Morn NP in CDU

## 2011-09-20 NOTE — ED Notes (Signed)
Prevacid rx changed to Omeprazole rx for 30 day supply, d/t pt calling stating that he needs a different med so insurance will cover it.

## 2011-10-13 ENCOUNTER — Encounter (HOSPITAL_COMMUNITY): Payer: Self-pay | Admitting: Family Medicine

## 2011-10-13 ENCOUNTER — Emergency Department (HOSPITAL_COMMUNITY)
Admission: EM | Admit: 2011-10-13 | Discharge: 2011-10-14 | Disposition: A | Discharge status: Other Acute Inpatient Hospital | Payer: Medicaid Other | Source: Home / Self Care | Attending: Emergency Medicine | Admitting: Emergency Medicine

## 2011-10-13 DIAGNOSIS — F1410 Cocaine abuse, uncomplicated: Secondary | ICD-10-CM

## 2011-10-13 DIAGNOSIS — F329 Major depressive disorder, single episode, unspecified: Secondary | ICD-10-CM

## 2011-10-13 DIAGNOSIS — F32A Depression, unspecified: Secondary | ICD-10-CM

## 2011-10-13 DIAGNOSIS — I2510 Atherosclerotic heart disease of native coronary artery without angina pectoris: Secondary | ICD-10-CM | POA: Insufficient documentation

## 2011-10-13 DIAGNOSIS — F17200 Nicotine dependence, unspecified, uncomplicated: Secondary | ICD-10-CM | POA: Insufficient documentation

## 2011-10-13 DIAGNOSIS — F1010 Alcohol abuse, uncomplicated: Secondary | ICD-10-CM | POA: Insufficient documentation

## 2011-10-13 DIAGNOSIS — F319 Bipolar disorder, unspecified: Secondary | ICD-10-CM | POA: Insufficient documentation

## 2011-10-13 LAB — COMPREHENSIVE METABOLIC PANEL
AST: 22 U/L (ref 0–37)
Albumin: 4 g/dL (ref 3.5–5.2)
Calcium: 8.9 mg/dL (ref 8.4–10.5)
Creatinine, Ser: 1.03 mg/dL (ref 0.50–1.35)
Total Protein: 7.6 g/dL (ref 6.0–8.3)

## 2011-10-13 LAB — RAPID URINE DRUG SCREEN, HOSP PERFORMED
Barbiturates: NOT DETECTED
Benzodiazepines: NOT DETECTED
Cocaine: POSITIVE — AB
Tetrahydrocannabinol: NOT DETECTED

## 2011-10-13 LAB — CBC
Hemoglobin: 14.6 g/dL (ref 13.0–17.0)
MCH: 30.7 pg (ref 26.0–34.0)
MCV: 87.2 fL (ref 78.0–100.0)
RBC: 4.75 MIL/uL (ref 4.22–5.81)

## 2011-10-13 LAB — ACETAMINOPHEN LEVEL: Acetaminophen (Tylenol), Serum: <15 ug/mL (ref 10–30)

## 2011-10-13 LAB — URINALYSIS, ROUTINE W REFLEX MICROSCOPIC
Bilirubin Urine: NEGATIVE
Specific Gravity, Urine: 1.009 (ref 1.005–1.030)
pH: 6 (ref 5.0–8.0)

## 2011-10-13 MED ORDER — ONDANSETRON HCL 4 MG PO TABS: 4.0000 mg | ORAL_TABLET | Freq: Three times a day (TID) | ORAL | Status: DC | PRN

## 2011-10-13 MED ORDER — ACETAMINOPHEN 325 MG PO TABS: 650.0000 mg | ORAL_TABLET | ORAL | Status: DC | PRN

## 2011-10-13 MED ORDER — FOLIC ACID 1 MG PO TABS
1.0000 mg | ORAL_TABLET | Freq: Every day | ORAL | Status: DC
  Administered 2011-10-14: 1 mg via ORAL
  Filled 2011-10-13: qty 1

## 2011-10-13 MED ORDER — IBUPROFEN 600 MG PO TABS: 600.0000 mg | ORAL_TABLET | Freq: Three times a day (TID) | ORAL | Status: DC | PRN

## 2011-10-13 MED ORDER — LORAZEPAM 1 MG PO TABS
1.0000 mg | ORAL_TABLET | Freq: Four times a day (QID) | ORAL | Status: DC | PRN
  Administered 2011-10-14: 1 mg via ORAL
  Filled 2011-10-13: qty 1

## 2011-10-13 MED ORDER — ZOLPIDEM TARTRATE 5 MG PO TABS: 5.0000 mg | ORAL_TABLET | Freq: Every evening | ORAL | Status: DC | PRN

## 2011-10-13 MED ORDER — ONDANSETRON 4 MG PO TBDP
4.0000 mg | ORAL_TABLET | Freq: Once | ORAL | Status: AC
Start: 2011-10-14 — End: 2011-10-14
  Administered 2011-10-14: 4 mg via ORAL
  Filled 2011-10-13: qty 1

## 2011-10-13 MED ORDER — VITAMIN B-1 100 MG PO TABS
100.0000 mg | ORAL_TABLET | Freq: Every day | ORAL | Status: DC
  Administered 2011-10-14: 100 mg via ORAL
  Filled 2011-10-13: qty 1

## 2011-10-13 MED ORDER — LORAZEPAM 2 MG/ML IJ SOLN: 1.0000 mg | Freq: Four times a day (QID) | INTRAMUSCULAR | Status: DC | PRN

## 2011-10-13 MED ORDER — ADULT MULTIVITAMIN W/MINERALS CH
1.0000 | ORAL_TABLET | Freq: Every day | ORAL | Status: DC
  Administered 2011-10-14: 1 via ORAL

## 2011-10-13 MED ORDER — THIAMINE HCL 100 MG/ML IJ SOLN: 100.0000 mg | Freq: Every day | INTRAMUSCULAR | Status: DC

## 2011-10-13 NOTE — ED Notes (Addendum)
Pt's belongings:  shirt, pants, underwear, socks, shoes, coat, wallet, cell phone, and pen. Red duffle bag:  3 shirts, 4 pr. Pants, 4 pr of socks, and a bar of soap. Black bag:  4.5 pr of socks, 2 shirts, 1 pants and bank information.   A total of 4 bags. Security called to wand patient and inventory belongings Belongings placed in locker A triage

## 2011-10-13 NOTE — ED Notes (Signed)
Patient states that he is here voluntarily to get help with his depression. States that he is depressed about his health (had bypass surgery last month at [facility]). States he's not getting along with his significant other right now. States that he used crack and drank (3) 40 oz beers prior to arrival. Patient calm and cooperative at this time.

## 2011-10-14 ENCOUNTER — Encounter (HOSPITAL_COMMUNITY): Payer: Self-pay | Admitting: Emergency Medicine

## 2011-10-14 ENCOUNTER — Inpatient Hospital Stay (HOSPITAL_COMMUNITY)
Admission: AD | Admit: 2011-10-14 | Discharge: 2011-10-18 | DRG: 897 | Disposition: A | Discharge status: Home / Self Care | Payer: Medicaid Other | Source: Ambulatory Visit | Attending: Psychiatry | Admitting: Psychiatry

## 2011-10-14 DIAGNOSIS — I2510 Atherosclerotic heart disease of native coronary artery without angina pectoris: Secondary | ICD-10-CM | POA: Diagnosis present

## 2011-10-14 DIAGNOSIS — F1210 Cannabis abuse, uncomplicated: Secondary | ICD-10-CM | POA: Diagnosis present

## 2011-10-14 DIAGNOSIS — F1020 Alcohol dependence, uncomplicated: Principal | ICD-10-CM | POA: Diagnosis present

## 2011-10-14 DIAGNOSIS — Z79899 Other long term (current) drug therapy: Secondary | ICD-10-CM

## 2011-10-14 DIAGNOSIS — F17200 Nicotine dependence, unspecified, uncomplicated: Secondary | ICD-10-CM | POA: Diagnosis present

## 2011-10-14 DIAGNOSIS — F1010 Alcohol abuse, uncomplicated: Secondary | ICD-10-CM | POA: Diagnosis present

## 2011-10-14 DIAGNOSIS — K219 Gastro-esophageal reflux disease without esophagitis: Secondary | ICD-10-CM | POA: Diagnosis present

## 2011-10-14 LAB — ETHANOL: Alcohol, Ethyl (B): 40 mg/dL — ABNORMAL HIGH (ref 0–11)

## 2011-10-14 MED ORDER — ALUM & MAG HYDROXIDE-SIMETH 200-200-20 MG/5ML PO SUSP: 30.0000 mL | ORAL | Status: DC | PRN

## 2011-10-14 MED ORDER — ACETAMINOPHEN 325 MG PO TABS
650.0000 mg | ORAL_TABLET | Freq: Four times a day (QID) | ORAL | Status: DC | PRN
  Administered 2011-10-15: 650 mg via ORAL

## 2011-10-14 MED ORDER — VITAMIN B-1 100 MG PO TABS
100.0000 mg | ORAL_TABLET | Freq: Every day | ORAL | Status: DC
  Administered 2011-10-15 – 2011-10-18 (×4): 100 mg via ORAL
  Filled 2011-10-14 (×6): qty 1

## 2011-10-14 MED ORDER — LOPERAMIDE HCL 2 MG PO CAPS: 2.0000 mg | ORAL_CAPSULE | ORAL | Status: AC | PRN

## 2011-10-14 MED ORDER — CHLORDIAZEPOXIDE HCL 25 MG PO CAPS
25.0000 mg | ORAL_CAPSULE | Freq: Four times a day (QID) | ORAL | Status: AC | PRN
  Administered 2011-10-14: 25 mg via ORAL
  Filled 2011-10-14: qty 1

## 2011-10-14 MED ORDER — ONDANSETRON 4 MG PO TBDP: 4.0000 mg | ORAL_TABLET | Freq: Four times a day (QID) | ORAL | Status: AC | PRN

## 2011-10-14 MED ORDER — HYDROXYZINE HCL 25 MG PO TABS
25.0000 mg | ORAL_TABLET | Freq: Four times a day (QID) | ORAL | Status: AC | PRN
  Administered 2011-10-14: 25 mg via ORAL

## 2011-10-14 MED ORDER — MAGNESIUM HYDROXIDE 400 MG/5ML PO SUSP: 30.0000 mL | Freq: Every day | ORAL | Status: DC | PRN

## 2011-10-14 MED ORDER — ADULT MULTIVITAMIN W/MINERALS CH
1.0000 | ORAL_TABLET | Freq: Every day | ORAL | Status: DC
  Administered 2011-10-15 – 2011-10-18 (×4): 1 via ORAL
  Filled 2011-10-14 (×6): qty 1

## 2011-10-14 MED ORDER — THIAMINE HCL 100 MG/ML IJ SOLN: 100.0000 mg | Freq: Once | INTRAMUSCULAR | Status: DC

## 2011-10-14 NOTE — BH Assessment (Signed)
Accepted by Dr. Javier Glazier to [facility] Room [phone]. Writer completed patients support paperwork. EDP-Jon knapp informed of patients disposition. Nurse-Sheila made aware of patients disposition and will discharge patient accordingly.

## 2011-10-14 NOTE — ED Notes (Addendum)
Pt presents to unit, calm and cooperative, pleasant towards staff. Denies pain and denies further need. Admission VS obtained. Pt admitted for complaints of increasing depression after his cardiac bypass last month, and has been using crack cocaine for the past few days. Pt states he and his girlfriend have been living in a hotel together, and that she is extremely depressed also so they  "kind of feed off eachother."

## 2011-10-14 NOTE — Progress Notes (Addendum)
Patient ID: LECIL TAPP, male   DOB: 14-Oct-1965, 46 y.o.   MRN: [id number] Pt admitted to [facility] Voluntarily for depression with passive SI. Pt states he is here for ETOH and crack use. Pt has a GF who uses on a daily basis, pt states he does not want to return to his relationship because he will fall into the same pattern. Pt denies SI currently No s/s of distress noted. Locker # 22 used for patient. Pt states he hopes to eventually move to [facility] because his family lives there and he has 3 grandchildren on the way. Pt pleasant, calm and cooperative during assessment.

## 2011-10-14 NOTE — ED Provider Notes (Addendum)
Filed Vitals:   10/14/11 0555  BP: 106/68  Pulse: 68  Temp: 98.3 F (36.8 C)  Resp: 18   Pt seen and assessed this morning. No new complaints. Passive SI with no plan and says doesn't think he could even go through with it if he did have a plan. Denies previous suicide attempt. Would like to be back on meds he was previously taking including prozac and seroquel. Can't remember doses or if others. Pt voluntary. Requesting inpt treatment at this time. If should change his mind, based on my interaction, I feel he can be discharged if given resources for drug/etoh abuse and also if restarted on meds per psychiatry recommendations.Raeford Razor, MD 10/14/11 1610  Raeford Razor, MD 10/14/11 [phone]

## 2011-10-14 NOTE — BH Assessment (Signed)
Assessment Note   [name] [name] is a 46 y.o. male who presents to [facility] voluntarily, endorsing SI with no plan and SA. He expresses that he is seeking inpatient treatment due to " depression, alcohol, crack cocaine, and spouse." He explains that he and his wife have experienced on going conflict for the past 7 years. He states they both drink heavily and abuse crack cocaine. He states that he feels like he wants to help her stop drinking but that he can not help her unless he first helps himself. He also states he has had financial difficulties recently which has put him "under pressure and in depression." He reports a history of bipolar disorder and states that when he gets depressed he starts experiencing SI. He says that he is worried that if he continues to feel depressed he will act on his thoughts and hurt himself. Pt was somewhat vague with describing depressive symptoms but was tearful through out the assessment. Pt repeated several times during the assessment that he "want to be happy again." He reports 1 prior suicide attempt in "1990 something." He reports 1 receiving inpatient behvaioral health treatment 1 prior time at [facility] in 10/2010. He denies having any current outpatient providers. He denies HI and [facility].  Pt endorses SA, stating he drinks 4 40 oz beers every few days, uses THC 1-2 times weekly, and uses crack cocaine 2-4 times weekly. He states that he has been using since 1999, when one of his children died. Pt is seeking inpatient mental health and SA treatment. Pt currently lives in a motel and states he does not want to return there. He states he has family he can live with after treatment while he looks for housing.    Axis I: Bipolar, Depressed and Alcohol Abuse, Cocaine Abuse Axis II: Deferred Axis III:  Past Medical History  Diagnosis Date  . Chest pain, mid sternal   . CAD in native artery     a. Nonobstructive cath 11/2007;  b. Presented with ST elevation - Nonobstructive cath  08/2011  . GERD (gastroesophageal reflux disease)   . Tobacco abuse   . Marijuana abuse     a. uses ~ 1x /wk or less  . History of cocaine abuse     a. quit ? 2009  . History of ETOH abuse     a. drinks 2 "40's" / wk  . Bradycardia     a. asymptomatic  . Bipolar disorder   . Syncope     a. 12/2010 - presumed to be vasovagal  . Abnormal ECG     a. early repolarization  . Coronary artery disease    Axis IV: housing problems, other psychosocial or environmental problems and problems related to social environment Axis V: 31-40 impairment in reality testing  Past Medical History:  Past Medical History  Diagnosis Date  . Chest pain, mid sternal   . CAD in native artery     a. Nonobstructive cath 11/2007;  b. Presented with ST elevation - Nonobstructive cath 08/2011  . GERD (gastroesophageal reflux disease)   . Tobacco abuse   . Marijuana abuse     a. uses ~ 1x /wk or less  . History of cocaine abuse     a. quit ? 2009  . History of ETOH abuse     a. drinks 2 "40's" / wk  . Bradycardia     a. asymptomatic  . Bipolar disorder   . Syncope     a.  12/2010 - presumed to be vasovagal  . Abnormal ECG     a. early repolarization  . Coronary artery disease     History reviewed. No pertinent past surgical history.  Family History: No family history on file.  Social History:  reports that he has been smoking Cigarettes.  He has smoked for the past 8 years. He does not have any smokeless tobacco history on file. He reports that he drinks alcohol. He reports that he uses illicit drugs (Cocaine).  Additional Social History:  Alcohol / Drug Use History of alcohol / drug use?: Yes Substance #1 Name of Substance 1: Alcohol 1 - Age of First Use: 20s  1 - Amount (size/oz): 4 40oz 1 - Frequency: every few days 1 - Duration: since 39s 1 - Last Use / Amount: 10/13/11 Substance #2 Name of Substance 2: THC 2 - Age of First Use: around age 45 2 - Amount (size/oz): 1 blunt 2 - Frequency: 1 or  2 times weekly 2 - Duration: since age 55 2 - Last Use / Amount: 10/11/11 Substance #3 Name of Substance 3: crack cocaine 3 - Age of First Use: around age 89 3 - Amount (size/oz): 40 dollars worth 3 - Frequency: 2-4 times weekly 3 - Duration: since age 36 Allergies: No Known Allergies  Home Medications:  (Not in a hospital admission)  OB/GYN Status:  No LMP for male patient.  General Assessment Data Location of Assessment: [facility] ED Living Arrangements: Spouse/significant other Can pt return to current living arrangement?: Yes Admission Status: Voluntary Is patient capable of signing voluntary admission?: Yes Transfer from: Home Referral Source: Self/Family/Friend  Education Status Is patient currently in school?: No Highest grade of school patient has completed: 10  Risk to self Suicidal Ideation: Yes-Currently Present Suicidal Intent: Yes-Currently Present Is patient at risk for suicide?: Yes Suicidal Plan?: No Access to Means: No What has been your use of drugs/alcohol within the last 12 months?: crack cocaine, THC, and alcohol Previous Attempts/Gestures: Yes How many times?: 1  Other Self Harm Risks: none Triggers for Past Attempts: Family contact Intentional Self Injurious Behavior: None Family Suicide History: No Recent stressful life event(s): Conflict (Comment) (conflict with spouse) Persecutory voices/beliefs?: No Depression: Yes Depression Symptoms: Tearfulness;Despondent;Loss of interest in usual pleasures;Feeling worthless/self pity;Guilt Substance abuse history and/or treatment for substance abuse?: Yes Suicide prevention information given to non-admitted patients: Not applicable  Risk to Others Homicidal Ideation: No Thoughts of Harm to Others: No Current Homicidal Intent: No Current Homicidal Plan: No Access to Homicidal Means: No Identified Victim: none History of harm to others?: No Assessment of Violence: None Noted Violent Behavior Description:  cooperative Does patient have access to weapons?: No Criminal Charges Pending?: No Does patient have a court date: No  Psychosis Hallucinations: None noted Delusions: None noted  Mental Status Report Appear/Hygiene: Disheveled Eye Contact: Good Motor Activity: Unremarkable Speech: Logical/coherent Level of Consciousness: Alert Mood: Depressed Affect: Depressed Anxiety Level: None Thought Processes: Coherent;Relevant Judgement: Impaired Orientation: Person;Time;Place;Situation Obsessive Compulsive Thoughts/Behaviors: None  Cognitive Functioning Concentration: Normal Memory: Recent Intact;Remote Intact IQ: Average Insight: Fair Impulse Control: Poor Appetite: Fair Weight Loss: 0  Weight Gain: 0  Sleep: No Change Vegetative Symptoms: None  Prior Inpatient Therapy Prior Inpatient Therapy: Yes Prior Therapy Dates: 2012 Prior Therapy Facilty/Provider(s): [facility] Reason for Treatment: depression and SI  Prior Outpatient Therapy Prior Outpatient Therapy: Yes Prior Therapy Facilty/Provider(s): [facility] Reason for Treatment: depression  ADL Screening (condition at time of admission) Patient's cognitive ability adequate to  safely complete daily activities?: Yes Patient able to express need for assistance with ADLs?: Yes Independently performs ADLs?: Yes Weakness of Legs: None Weakness of Arms/Hands: None  Home Assistive Devices/Equipment Home Assistive Devices/Equipment: None    Abuse/Neglect Assessment (Assessment to be complete while patient is alone) Physical Abuse: Denies Verbal Abuse: Denies Sexual Abuse: Denies Exploitation of patient/patient's resources: Denies Self-Neglect: Denies Values / Beliefs Cultural Requests During Hospitalization: None Spiritual Requests During Hospitalization: None   Advance Directives (For Healthcare) Advance Directive: Patient does not have advance directive;Patient would not like information Pre-existing out of facility DNR  order (yellow form or pink MOST form): No Nutrition Screen Diet: Regular Unintentional weight loss greater than 10lbs within the last month: No Problems chewing or swallowing foods and/or liquids: No Home Tube Feeding or Total Parenteral Nutrition (TPN): No Patient appears severely malnourished: No  Additional Information 1:1 In Past 12 Months?: No CIRT Risk: No Elopement Risk: No Does patient have medical clearance?: Yes     Disposition:  Disposition Disposition of Patient: Referred to;Inpatient treatment program Type of inpatient treatment program: Adult Pt has been [facility] to [facility] for inpatient behavioral health treatment. On Site Evaluation by:   Reviewed with Physician:     Georgina Quint A 10/14/2011 2:11 AM

## 2011-10-14 NOTE — Tx Team (Signed)
Initial Interdisciplinary Treatment Plan  PATIENT STRENGTHS: (choose at least two) Ability for insight Active sense of humor Average or above average intelligence Capable of independent living Communication skills General fund of knowledge Motivation for treatment/growth Supportive family/friends  PATIENT STRESSORS: Educational concerns Financial difficulties Marital or family conflict Substance abuse   PROBLEM LIST: Problem List/Patient Goals Date to be addressed Date deferred Reason deferred Estimated date of resolution  Substance abuse 10/14/11     Depression 10/14/11     Financial concerns 10/14/11                                          DISCHARGE CRITERIA:  Ability to meet basic life and health needs Adequate post-discharge living arrangements Improved stabilization in mood, thinking, and/or behavior Motivation to continue treatment in a less acute level of care Need for constant or close observation no longer present Reduction of life-threatening or endangering symptoms to within safe limits Safe-care adequate arrangements made Verbal commitment to aftercare and medication compliance Withdrawal symptoms are absent or subacute and managed without 24-hour nursing intervention  PRELIMINARY DISCHARGE PLAN: Attend aftercare/continuing care group Placement in alternative living arrangements  PATIENT/FAMIILY INVOLVEMENT: This treatment plan has been presented to and reviewed with the patient, [name] [name], and/or family member, .  The patient and family have been given the opportunity to ask questions and make suggestions.  [name] 10/14/2011, 8:36 PM

## 2011-10-14 NOTE — ED Provider Notes (Signed)
History     CSN: [id number]  Arrival date & time 10/13/11  2154   First MD Initiated Contact with Patient 10/13/11 2248      Chief Complaint  Patient presents with  . Medical Clearance    (Consider location/radiation/quality/duration/timing/severity/associated sxs/prior treatment) HPI  Patient presents to emergency department complaining of alcohol abuse, cocaine abuse, and increasing depression. Patient states that he has had depression over the last 7 years that has been managed by medicine in the past and has required inpatient psychiatric evaluation in the past. Patient states that he has had increased life stressors over the last few weeks to months and therefore has increased depression. Patient states he drinks daily large amount of alcohol and last used crack cocaine prior to arrival. Patient states that he required cardiac catheterization within the last month and since that evaluation has felt increased anxiety and depression about potential cardiac disease. Patient states he did not require any stent placement or cardiac intervention outside of the cardiac catheterization however it does create a lot of anxiety. Patient states he drinks anywhere between 8-10 40 ounce beers daily. Patient denies suicidal ideation or homicidal ideation. He denies hallucinations. Patient states that he is here voluntarily seeking help for his increasing depression, alcohol abuse, and cocaine abuse. He has no physical complaint at this time. Past Medical History  Diagnosis Date  . Chest pain, mid sternal   . CAD in native artery     a. Nonobstructive cath 11/2007;  b. Presented with ST elevation - Nonobstructive cath 08/2011  . GERD (gastroesophageal reflux disease)   . Tobacco abuse   . Marijuana abuse     a. uses ~ 1x /wk or less  . History of cocaine abuse     a. quit ? 2009  . History of ETOH abuse     a. drinks 2 "40's" / wk  . Bradycardia     a. asymptomatic  . Bipolar disorder   . Syncope      a. 12/2010 - presumed to be vasovagal  . Abnormal ECG     a. early repolarization  . Coronary artery disease     History reviewed. No pertinent past surgical history.  No family history on file.  History  Substance Use Topics  . Smoking status: Current Everyday Smoker -- 8 years    Types: Cigarettes  . Smokeless tobacco: Not on file   Comment: Smokes about 3 cigarettes/day  . Alcohol Use: Yes     drinks 2, 40 oz beers/wk      Review of Systems  All other systems reviewed and are negative.    Allergies  Review of patient's allergies indicates no known allergies.  Home Medications   Current Outpatient Rx  Name Route Sig Dispense Refill  . NITROGLYCERIN 0.4 MG SL SUBL Sublingual Place 0.4 mg under the tongue every 5 (five) minutes as needed. For chest pain      There were no vitals taken for this visit.  Physical Exam  Nursing note and vitals reviewed. Constitutional: He is oriented to person, place, and time. He appears well-developed and well-nourished. No distress.  HENT:  Head: Normocephalic and atraumatic.  Eyes: Conjunctivae and EOM are normal. Pupils are equal, round, and reactive to light.  Neck: Normal range of motion. Neck supple.  Cardiovascular: Normal rate, regular rhythm, normal heart sounds and intact distal pulses.  Exam reveals no gallop and no friction rub.   No murmur heard. Pulmonary/Chest: Effort normal and breath sounds  normal. No respiratory distress. He has no wheezes. He has no rales. He exhibits no tenderness.  Abdominal: Bowel sounds are normal. He exhibits no distension and no mass. There is no tenderness. There is no rebound and no guarding.  Musculoskeletal: Normal range of motion. He exhibits no edema and no tenderness.  Neurological: He is alert and oriented to person, place, and time.  Skin: Skin is warm and dry. No rash noted. He is not diaphoretic. No erythema.  Psychiatric: His speech is normal and behavior is normal. Judgment  and thought content normal. Cognition and memory are normal. He exhibits a depressed mood.       Very tearful in ER.     ED Course  Procedures (including critical care time)  Temp psych orders written and CIWA protocol started. Patient denies SI and is free to leave at anytime though he is here voluntarily seeking help and very cooperative.   Labs Reviewed  URINALYSIS, ROUTINE W REFLEX MICROSCOPIC - Abnormal; Notable for the following:    Hgb urine dipstick LARGE (*)    All other components within normal limits  URINE RAPID DRUG SCREEN (HOSP PERFORMED) - Abnormal; Notable for the following:    Cocaine POSITIVE (*)    All other components within normal limits  COMPREHENSIVE METABOLIC PANEL - Abnormal; Notable for the following:    Potassium 3.3 (*)    GFR calc non Af Amer 85 (*)    All other components within normal limits  SALICYLATE LEVEL - Abnormal; Notable for the following:    Salicylate Lvl <2.0 (*)    All other components within normal limits  CBC  ACETAMINOPHEN LEVEL  URINE MICROSCOPIC-ADD ON   No results found.   1. Alcohol abuse   2. Cocaine abuse   3. Depression       MDM  Waiting act or psych evaluation for consideration of inpatient placement. Holding orders and CIWA initiated.         Jenness Corner, [place] 10/14/11 [phone]

## 2011-10-14 NOTE — Progress Notes (Signed)
Patient appeared disheveled when he walked to medication window. He asked for Seroquel for sleep. Pt had no Seroquel ordered. Writer gave PRN Librium and Vistaril. Patient received medications as ordered. He denied SI/HI and denied hallucinations. Q 15 minute checks continues as ordered to maintain safety.

## 2011-10-15 DIAGNOSIS — F1020 Alcohol dependence, uncomplicated: Secondary | ICD-10-CM

## 2011-10-15 MED ORDER — FLUOXETINE HCL 20 MG PO CAPS
20.0000 mg | ORAL_CAPSULE | Freq: Every day | ORAL | Status: DC
  Administered 2011-10-15 – 2011-10-18 (×4): 20 mg via ORAL
  Filled 2011-10-15 (×7): qty 1

## 2011-10-15 MED ORDER — QUETIAPINE FUMARATE 100 MG PO TABS
100.0000 mg | ORAL_TABLET | Freq: Every day | ORAL | Status: DC
  Administered 2011-10-15 – 2011-10-17 (×3): 100 mg via ORAL
  Filled 2011-10-15 (×6): qty 1

## 2011-10-15 NOTE — Progress Notes (Signed)
[facility] Group Notes:  (Counselor/Nursing/MHT/Case Management/Adjunct)  10/15/2011 6:32 PM  Type of Therapy:  Group Therapy  Participation Level:  Minimal  Participation Quality:  Attentive and Drowsy  Affect:  Appropriate  Cognitive:  Oriented  Insight:  None shared  Engagement in Group:  Limited  Modes of Intervention:  Education, Orientation and Support  Summary of Progress/Problems: Elius attended first group therapy session of his admit and shared with group members negative impacts of substance use which led to his admit.  He was attentive to discussion on Post Acute Withdrawal Syndrome (PAWS).    [name] 10/15/2011, 6:37 PM  [facility] Group Notes:  (Counselor/Nursing/MHT/Case Management/Adjunct)  10/15/2011 6:37 PM  Type of Therapy:  Group Therapy  Participation Level:  Active  Participation Quality:  Attentive and Sharing  Affect:  Appropriate  Cognitive:  Alert and Oriented  Insight:  Limited  Engagement in Group:  Good  Modes of Intervention:  Clarification, Socialization and Support  Summary of Progress/Problems:  Amaurie shared his concerns regarding his potential relapse and limited resources and supports.  Others in group suggested he let group be his support right now and not be too concerned with others who are still out there using. Mickell visibly appreciated the support but then minimized it as "you'll be gone tomorrow."  Patient was encouraged to consider that he will 'always be able to find at least emotional supports when he is working in his own best interests' by another group member.   [name] 10/15/2011, 6:47 PM

## 2011-10-15 NOTE — ED Provider Notes (Signed)
Medical screening examination/treatment/procedure(s) were performed by non-physician practitioner and as supervising physician I was immediately available for consultation/collaboration.  Sully Dyment, MD 10/15/11 0126 
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
## 2011-10-15 NOTE — Progress Notes (Signed)
Pt alert and oriented times four. Pt still have complaints of anxiety and level 8 for depression.  Pt medications given and pt verbalized plan to discharge and go home to spend time with his grandchildren.

## 2011-10-15 NOTE — H&P (Signed)
Pt seen and evaluated upon admission.  Completed Admission Suicide Risk Assessment.  See orders.  Pt agreeable with plan.  Discussed with team.   
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
## 2011-10-16 DIAGNOSIS — F3130 Bipolar disorder, current episode depressed, mild or moderate severity, unspecified: Secondary | ICD-10-CM

## 2011-10-16 DIAGNOSIS — F1010 Alcohol abuse, uncomplicated: Secondary | ICD-10-CM

## 2011-10-16 NOTE — Progress Notes (Signed)
Patient ID: [name] [name], male   DOB: [birth year], 46 y.o.   MRN: [id number]  Pt has been very flat and depressed  on the unit. Pt has not attended any groups, nor has he engaged in treatment. Pt reported on his self inventory that he was having a good day and that he was not depressed or hopeless. Pt reported being negative SI/HI, no AH/VH noted.

## 2011-10-16 NOTE — Progress Notes (Signed)
[facility] MD Progress Note  10/16/2011 1:40 PM  Diagnosis:   Axis I: Alcohol Abuse and Bipolar, Depressed Axis II: Deferred Axis III:  Past Medical History  Diagnosis Date  . Chest pain, mid sternal   . CAD in native artery     a. Nonobstructive cath 11/2007;  b. Presented with ST elevation - Nonobstructive cath 08/2011  . GERD (gastroesophageal reflux disease)   . Tobacco abuse   . Marijuana abuse     a. uses ~ 1x /wk or less  . History of cocaine abuse     a. quit ? 2009  . History of ETOH abuse     a. drinks 2 "40's" / wk  . Bradycardia     a. asymptomatic  . Bipolar disorder   . Syncope     a. 12/2010 - presumed to be vasovagal  . Abnormal ECG     a. early repolarization  . Coronary artery disease    Subjective: [name] [name] is lying in bed with the covers over his head this afternoon, reporting that the Seroquel which was restarted last night has made him feel "woozy." He reports that his mood is somewhat improved today and he denies any suicidal or homicidal ideation. He also denies any auditory or visual hallucinations. He reports that he slept well last night, and that his appetite is good. He denies any cravings for cocaine or other substances. He reports that his depression was the trigger for relapse.  ADL's:  Intact  Sleep: Good  Appetite:  Good  Suicidal Ideation:  Patient denies thought, plan, or intent Homicidal Ideation:  Patient denies thought, plan, or intent  AEB (as evidenced by):  Mental Status Examination/Evaluation: Objective:  Appearance: Fairly groomed  Patent attorney::  Fair  Speech:  Clear and Coherent  Volume:  Normal  Mood:  Dysphoric  Affect:  Congruent  Thought Process:  Logical  Orientation:  Full  Thought Content:  WDL  Suicidal Thoughts:  No  Homicidal Thoughts:  No  Memory:  Immediate;   Good  Judgement:  Impaired  Insight:  Lacking  Psychomotor Activity:  Normal  Concentration:  Good  Recall:  Good  Akathisia:  No  Handed:    AIMS (if  indicated):     Assets:  Resilience  Sleep:  Number of Hours: 6.5    Vital Signs:Blood pressure 107/63, pulse 62, temperature 97.6 F (36.4 C), temperature source Oral, resp. rate 16, height 5\' 4"  (1.626 m), weight 52.617 kg (116 lb). Current Medications: Current Facility-Administered Medications  Medication Dose Route Frequency Provider Last Rate Last Dose  . acetaminophen (TYLENOL) tablet 650 mg  650 mg Oral Q6H PRN Larena Sox, MD   650 mg at 10/15/11 2130  . alum & mag hydroxide-simeth (MAALOX/MYLANTA) 200-200-20 MG/5ML suspension 30 mL  30 mL Oral Q4H PRN Larena Sox, MD      . chlordiazePOXIDE (LIBRIUM) capsule 25 mg  25 mg Oral Q6H PRN Larena Sox, MD   25 mg at 10/14/11 2200  . FLUoxetine (PROZAC) capsule 20 mg  20 mg Oral Daily Alyson Kuroski-Mazzei, DO   20 mg at 10/16/11 1610  . hydrOXYzine (ATARAX/VISTARIL) tablet 25 mg  25 mg Oral Q6H PRN Larena Sox, MD   25 mg at 10/14/11 2200  . loperamide (IMODIUM) capsule 2-4 mg  2-4 mg Oral PRN Larena Sox, MD      . magnesium hydroxide (MILK OF MAGNESIA) suspension 30 mL  30 mL Oral Daily PRN Otelia Santee  Aundra Millet, MD      . mulitivitamin with minerals tablet 1 tablet  1 tablet Oral Daily Larena Sox, MD   1 tablet at 10/16/11 0828  . ondansetron (ZOFRAN-ODT) disintegrating tablet 4 mg  4 mg Oral Q6H PRN Larena Sox, MD      . QUEtiapine (SEROQUEL) tablet 100 mg  100 mg Oral QHS Alyson Kuroski-Mazzei, DO   100 mg at 10/15/11 2129  . thiamine (B-1) injection 100 mg  100 mg Intramuscular Once Larena Sox, MD      . thiamine (VITAMIN B-1) tablet 100 mg  100 mg Oral Daily Larena Sox, MD   100 mg at 10/16/11 1610    Lab Results:  Results for orders placed during the hospital encounter of 10/14/11 (from the past 48 hour(s))  POTASSIUM     Status: Normal   Collection Time   10/15/11  6:27 AM      Component Value Range Comment   Potassium 3.7  3.5 - 5.1 (mEq/L)     Physical Findings: AIMS:  , ,  ,   ,    CIWA:  CIWA-Ar Total: 0  COWS:     Treatment Plan Summary: Daily contact with patient to assess and evaluate symptoms and progress in treatment Medication management  Plan: We will continue his current plan of care, and monitor for change. We will research options for followup resources.  [name] [name] 10/16/2011, 1:40 PM

## 2011-10-16 NOTE — Progress Notes (Signed)
[facility] Adult Inpatient Family/Significant Other Suicide Prevention Education  Suicide Prevention Education:  Patient Refusal for Family/Significant Other Suicide Prevention Education: The patient [name] [name] has refused to provide written consent for family/significant other to be provided Family/Significant Other Suicide Prevention Education during admission and/or prior to discharge.  Physician notified.  Pt stated therapist could call his sister but did not have his sisters number. Pt gave therapist number for his son but did not know the correct number for his son "they all have cell phones and are always changing their numbers". Pt stated that if he recalled the number or found a way to get the number he would tell staff.  Pt stated that he was able to call the hotel and found out his girlfriend is no longer there. She was the main stressor and reason he wanted to leave the hotel. Pt stated he has paid for the room for this month and would like to return there at D/C.  Pt. accepted information on suicide prevention, warning signs to look for with suicide and crisis line numbers to use. The pt. agreed to call crisis line numbers if having warning signs or having thoughts of suicide.    [facility] 10/16/2011, 4:25 PM

## 2011-10-16 NOTE — Progress Notes (Signed)
Patient ID: [name] [name], male   DOB: 04/14/1966, 46 y.o.   MRN: [id number] Pt pleasant and cooperative during assessment, pt continues to endorse depression but interacts appropriately with staff/peers in the milieu. Pt denies SI/HI at this time and is compliant with medications during shift. No s/s of distress noted.

## 2011-10-16 NOTE — Progress Notes (Signed)
Patient ID: PIUS BYROM, male   DOB: 03/22/1966, 46 y.o.   MRN: [id number]   [facility] Group Notes:  (Counselor/Nursing/MHT/Case Management/Adjunct)  10/16/2011 1:15 PM  Type of Therapy:  Group Therapy, Dance/Movement Therapy   Participation Level:  Did Not Attend   Rhunette Croft

## 2011-10-17 NOTE — Progress Notes (Signed)
Patient ID: [name] [name], male   DOB: 15-Apr-1966, 46 y.o.   MRN: [id number]  [facility] Group Notes:  (Counselor/Nursing/MHT/Case Management/Adjunct)  10/17/2011 1:15 PM  Type of Therapy:  Group Therapy, Dance/Movement Therapy   Participation Level:  Did Not Attend     [facility]

## 2011-10-17 NOTE — Progress Notes (Signed)
[facility] MD Progress Note  10/17/2011 12:56 PM  Diagnosis:   Axis I: Alcohol Abuse and Bipolar, Depressed Axis II: Deferred Axis III:  Past Medical History  Diagnosis Date  . Chest pain, mid sternal   . CAD in native artery     a. Nonobstructive cath 11/2007;  b. Presented with ST elevation - Nonobstructive cath 08/2011  . GERD (gastroesophageal reflux disease)   . Tobacco abuse   . Marijuana abuse     a. uses ~ 1x /wk or less  . History of cocaine abuse     a. quit ? 2009  . History of ETOH abuse     a. drinks 2 "40's" / wk  . Bradycardia     a. asymptomatic  . Bipolar disorder   . Syncope     a. 12/2010 - presumed to be vasovagal  . Abnormal ECG     a. early repolarization  . Coronary artery disease    Subjective: [name] [name] reports that he is doing much better today. He states that his mood is good and stable. He denies any withdrawal or cravings. He denies any suicidal or homicidal ideation. He denies any auditory or visual hallucinations. He reports that he slept well last night, and that his appetite has been good today. He reports that he spoke with the case manager and had endorsed an interest in going into the [facility] for treatment, but now he would like to return home.  ADL's:  Intact  Sleep: Good  Appetite:  Good  Suicidal Ideation:  Patient denies any thought, plan, or intent Homicidal Ideation:  Patient denies any thought, plan, or intent  AEB (as evidenced by):  Mental Status Examination/Evaluation: Objective:  Appearance: Fairly Groomed  Patent attorney::  Fair  Speech:  Clear and Coherent  Volume:  Normal  Mood:  Euthymic  Affect:  Appropriate  Thought Process:  Circumstantial  Orientation:  Full  Thought Content:  WDL  Suicidal Thoughts:  No  Homicidal Thoughts:  No  Memory:  Immediate;   Good  Judgement:  Impaired  Insight:  Lacking  Psychomotor Activity:  Normal  Concentration:  Good  Recall:  Good  Akathisia:  No  Handed:    AIMS (if indicated):       Assets:    Sleep:  Number of Hours: 5.5    Vital Signs:Blood pressure 112/58, pulse 63, temperature 98.5 F (36.9 C), temperature source Oral, resp. rate 16, height 5\' 4"  (1.626 m), weight 52.617 kg (116 lb). Current Medications: Current Facility-Administered Medications  Medication Dose Route Frequency Provider Last Rate Last Dose  . acetaminophen (TYLENOL) tablet 650 mg  650 mg Oral Q6H PRN Larena Sox, MD   650 mg at 10/15/11 2130  . alum & mag hydroxide-simeth (MAALOX/MYLANTA) 200-200-20 MG/5ML suspension 30 mL  30 mL Oral Q4H PRN Larena Sox, MD      . chlordiazePOXIDE (LIBRIUM) capsule 25 mg  25 mg Oral Q6H PRN Larena Sox, MD   25 mg at 10/14/11 2200  . FLUoxetine (PROZAC) capsule 20 mg  20 mg Oral Daily Alyson Kuroski-Mazzei, DO   20 mg at 10/17/11 0848  . hydrOXYzine (ATARAX/VISTARIL) tablet 25 mg  25 mg Oral Q6H PRN Larena Sox, MD   25 mg at 10/14/11 2200  . loperamide (IMODIUM) capsule 2-4 mg  2-4 mg Oral PRN Larena Sox, MD      . magnesium hydroxide (MILK OF MAGNESIA) suspension 30 mL  30 mL Oral Daily PRN  Larena Sox, MD      . mulitivitamin with minerals tablet 1 tablet  1 tablet Oral Daily Larena Sox, MD   1 tablet at 10/17/11 0848  . ondansetron (ZOFRAN-ODT) disintegrating tablet 4 mg  4 mg Oral Q6H PRN Larena Sox, MD      . QUEtiapine (SEROQUEL) tablet 100 mg  100 mg Oral QHS Alyson Kuroski-Mazzei, DO   100 mg at 10/16/11 2115  . thiamine (B-1) injection 100 mg  100 mg Intramuscular Once Larena Sox, MD      . thiamine (VITAMIN B-1) tablet 100 mg  100 mg Oral Daily Larena Sox, MD   100 mg at 10/17/11 0848    Lab Results: No results found for this or any previous visit (from the past 48 hour(s)).  Physical Findings: AIMS:  , ,  ,  ,    CIWA:  CIWA-Ar Total: 0  COWS:     Treatment Plan Summary: Daily contact with patient to assess and evaluate symptoms and progress in treatment Medication management  Plan: [name] [name]  appears to be making good progress. It is concerning that he no longer is interested in going to treatment. He was impressed upon him that he needs to at least attend 12-step meetings on a regular basis to reduce the possibility of relapse. We will continue his current plan of care with possible discharge tomorrow. [name] [name] 10/17/2011, 12:56 PM

## 2011-10-17 NOTE — Progress Notes (Signed)
Patient ID: [name] [name], male   DOB: [birth year], 46 y.o.   MRN: [id number]  Pt has been very isolative today on the unit, pt has been in his room most of the morning. Pt has not attended groups and has not engaged in treatment. Pt reported being negative SI/HI, no AH/VH noted. Pt refused to fill out self inventory sheet.

## 2011-10-17 NOTE — Progress Notes (Signed)
Patient ID: [name] [name], male   DOB: November 12, 1965, 46 y.o.   MRN: [id number] Has been pleasant, shy, but somewhat conversational, stated was having a fairly good evening, denied withdrawal sx.  Stated is trrying to get back on track, expecting 3 grandchildren this year.  Attended group, compliant with meds.  Will continue to monitor.

## 2011-10-18 MED ORDER — NITROGLYCERIN 0.4 MG SL SUBL: 0.4000 mg | SUBLINGUAL_TABLET | SUBLINGUAL | Status: DC | PRN

## 2011-10-18 MED ORDER — FLUOXETINE HCL 20 MG PO CAPS: 20.0000 mg | ORAL_CAPSULE | Freq: Every day | ORAL | Status: DC

## 2011-10-18 MED ORDER — QUETIAPINE FUMARATE 100 MG PO TABS: 100.0000 mg | ORAL_TABLET | Freq: Every day | ORAL | Status: DC

## 2011-10-18 NOTE — Tx Team (Signed)
Interdisciplinary Treatment Plan Update (Adult)  Date:  10/18/2011  Time Reviewed:  9:59 AM   Progress in Treatment: Attending groups: Yes Participating in groups:  Yes Taking medication as prescribed: Yes Tolerating medication:  Yes Family/Significant othe contact made:  No, pt refused Patient understands diagnosis:  Yes Discussing patient identified problems/goals with staff:  Yes Medical problems stabilized or resolved:  Yes Denies suicidal/homicidal ideation: Yes Issues/concerns per patient self-inventory:  None identified Other: N/A  New problem(s) identified: None Identified  Reason for Continuation of Hospitalization: Stable to d/c  Interventions implemented related to continuation of hospitalization: Stable to d/c  Additional comments: N/A  Estimated length of stay: D/C today  Discharge Plan: Pt will follow up at [facility] for medication management and therapy and AA meetings.  New goal(s): N/A  Review of initial/current patient goals per problem list:    1.  Goal(s): Address substance use  Met:  Yes  Target date: by discharge  As evidenced by: completed detox protocol and referred to appropriate treatment  2.  Goal (s): Reduce depressive and anxiety symptoms  Met:  Yes  Target date: by discharge  As evidenced by: Reducing depression from a 10 to a 3 as reported by pt.  Pt denies depression and anxiety.   3.  Goal(s): Eliminate SI  Met:  Yes  Target date: by discharge  As evidenced by: Pt denies SI.    Attendees: Patient:  [name] [name] 10/18/2011 10:00 AM   Family:     Physician:  Lupe Carney, DO 10/18/2011 9:59 AM   Nursing: Roswell Miners, RN 10/18/2011 9:59 AM   Case Manager:  Reyes Ivan, LCSWA 10/18/2011  9:59 AM   Counselor:  Ronda Fairly, LCSWA 10/18/2011  9:59 AM   Other:  Richelle Ito, LCSW 10/18/2011 9:59 AM   Other:  Robbie Louis, RN 10/18/2011 10:01 AM   Other:     Other:      Scribe for Treatment Team:   Reyes Ivan  10/18/2011 9:59 AM

## 2011-10-18 NOTE — Progress Notes (Signed)
Patient ID: [name] [name], male   DOB: [birth year], 46 y.o.   MRN: [id number] He has been up and about and to groups. Interacting with peers and staff. Self inventory denies SI thought.
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
## 2011-10-18 NOTE — Progress Notes (Signed)
[facility] Group Notes:  (Counselor/Nursing/MHT/Case Management/Adjunct)  10/18/2011 4:34 PM  Type of Therapy:  Group Therapy at 11  Participation Level:  Minimal  Participation Quality:  Attentive  Affect:  Appropriate  Cognitive:  Alert and Oriented  Insight:  None shared  Engagement in Group:  Limited  Engagement in Therapy:  Limited  Modes of Intervention:  Problem-solving, Socialization and Support  Summary of Progress/Problems:   Group discussion focused on what patient's see as their own obstacles to recovery.  Patient shared belief that changing routine and avoiding some friends will be difficult to deal with as he is returning to same environment.     [name] 10/18/2011, 4:36 PM  [facility] Group Notes:  (Counselor/Nursing/MHT/Case Management/Adjunct)  10/18/2011 4:37 PM  Type of Therapy:  Group Therapy at 1:15  Participation Level:  Minimal  Participation Quality:  Attentive  Affect:  Appropriate  Cognitive:  Alert and Oriented  Insight:  None shared  Engagement in Group:  Limited  Modes of Intervention:  Education, Socialization and Support  Summary of Progress/Problems: Patient attended group presentation by Interior and spatial designer of  [employer] [place] ([employer]). [place] was attentive and appropriate during session.     [name] 10/18/2011, 4:38 PM

## 2011-10-18 NOTE — Progress Notes (Signed)
[facility] Case Management Discharge Plan:  Will you be returning to the same living situation after discharge: Yes,  return to motel he was previously staying at At discharge, do you have transportation home?:Yes,  provided bus pass Do you have the ability to pay for your medications:Yes,  access to meds  Release of information consent forms completed and in the chart;  Patient's signature needed at discharge.  Patient to Follow up at:  Follow-up Information    Follow up with [facility] on 10/21/2011. (Appointment scheduled at 1:45 pm with Dr. Roxan Hockey)    Contact information:   [address], [place] [address] [phone]      Follow up with AA meetings. (See brochure for meeting times!)          Patient denies SI/HI:   Yes,  denies SI/HI    Safety Planning and Suicide Prevention discussed:  Yes,  discussed with pt today  Barrier to discharge identified:No.  Summary and Recommendations: Pt attended discharge planning group and actively participated.  Pt presents with bright mood and affect.  Pt denies having depression, anxiety or SI today.  Pt reports feeling stable to d/c today, stating he feels more stable with his new meds.  Pt discussed talking with the manager at the motel and his ex girlfriend was banned from the property.  Pt states this makes him feel at peace and wants to return home now vs. Going to further treatment.  No recommendations from SW.  No further needs voiced by pt.  Pt stable to discharge.      Carmina Miller 10/18/2011, 11:55 AM

## 2011-10-18 NOTE — Progress Notes (Signed)
Patient ID: [name] [name], male   DOB: 09/21/1965, 46 y.o.   MRN: [id number] Has been pleasant and cooperative this evening, smiling but quiet. Attending AA group and interacting appropriately with staff and select peers.  Has been taking his meds, asking appropriate questions, talked about calling his mother today and that he's expecting 3 grandchildren. Denies sx of withdrawal, feels detox has gone well and wants to stay sober.  Will continue to monitor.

## 2011-10-18 NOTE — Discharge Summary (Signed)
Physician Discharge Summary Note  Patient:  [name] [name] is an 46 y.o., male MRN:  [id number] DOB:  1966/03/10 Patient phone:  [phone] (home)  Patient address:   [place] [address],   Date of Admission:  10/14/2011 Date of Discharge: 10/18/11  Reason for Admission: Alcohol detoxification.  Discharge Diagnoses: Principal Problem:  *Alcohol dependence   Axis Diagnosis:   AXIS I:  Alcohol dependence AXIS II:  Deferred AXIS III:   Past Medical History  Diagnosis Date  . Chest pain, mid sternal   . CAD in native artery     a. Nonobstructive cath 11/2007;  b. Presented with ST elevation - Nonobstructive cath 08/2011  . GERD (gastroesophageal reflux disease)   . Tobacco abuse   . Marijuana abuse     a. uses ~ 1x /wk or less  . History of cocaine abuse     a. quit ? 2009  . History of ETOH abuse     a. drinks 2 "40's" / wk  . Bradycardia     a. asymptomatic  . Bipolar disorder   . Syncope     a. 12/2010 - presumed to be vasovagal  . Abnormal ECG     a. early repolarization  . Coronary artery disease    AXIS IV:  housing problems, occupational problems and other psychosocial or environmental problems AXIS V:  67  Level of Care:  OP  Hospital Course:  [name] [name] presented to the ED complaining of increased depression, anhedonia, poor sleep, poor appetite with increased irritibility and increased arguing with his fiance',  He notes he has been ill tempered. He states his substance abuse has also increased into drinking 2 quarts of beer a day, now using crack cocaine, he states 20$ worth on the day of admission. He states he uses crack 1-2 x a month, and smokes pot 2-3 x a month.  Upon admission to this behavioral health hospital, [name] [name] was started on Librium protocol for alcohol detoxification. He also received Fluoxetine 20 mg for his depressive mood symptoms and Seroquel 100 mg Q bedtime for mood control/stabilization. Patient was enrolled in group/substance  abuse counseling sessions of which he participated actively. He was detoxified from alcohol intoxication without any issues and unusual events noted and or documented. He tolerated his treatment regimen without any significant adverse effects and or reactions noted.   Patient attended treatment team meeting this morning and stated that his mood is stable. He presented with bright affect and affirmed that he is stable for discharge. Patient will continue psychiatric care on outpatient basis at [facility] on 10/21/11. He is aware that this is a walk-in appointment between the hours of 08:00 am and 03:00 pm. The address, date and time for this appointment provided for patient. He was also recommended and encouraged to join a weekly AA meetings being offered with in his community. He was provided with a brochure with information on how to contact and join the group.   Patient upon discharge denies suicidal, homicidal ideations, auditory, visual hallucinations and or delusional thinking. Patient left [facility] with all personal belongings via personal arranged transportation. He was in no apparent distress.   Consults:  None  Significant Diagnostic Studies:  labs: CMP  Discharge Vitals:   Blood pressure 106/67, pulse 64, temperature 98 F (36.7 C), temperature source Oral, resp. rate 16, height 5\' 4"  (1.626 m), weight 52.617 kg (116 lb).  Mental Status Exam: See Mental Status Examination and Suicide Risk Assessment completed by Attending  Physician prior to discharge.  Discharge destination:  Other:  [facility]el where he was staying prior to admission to [facility].  Is patient on multiple antipsychotic therapies at discharge:  No   Has Patient had three or more failed trials of antipsychotic monotherapy by history:  No  Recommended Plan for Multiple Antipsychotic Therapies: NA   Medication List  As of 10/18/2011  2:40 PM   TAKE these medications      Indication    FLUoxetine 20 MG capsule   Commonly known as:  PROZAC   Take 1 capsule (20 mg total) by mouth daily. For depression       nitroGLYCERIN 0.4 MG SL tablet   Commonly known as: NITROSTAT   Place 1 tablet (0.4 mg total) under the tongue every 5 (five) minutes as needed. For chest pain       QUEtiapine 100 MG tablet   Commonly known as: SEROQUEL   Take 1 tablet (100 mg total) by mouth at bedtime. For mood control            Follow-up Information    Follow up with [facility] on 10/21/2011. (Appointment scheduled at 1:45 pm with Dr. Roxan Hockey)    Contact [address]:   [address], [place] [address] [phone]      Follow up with AA meetings. (See brochure for meeting times!)          Follow-up recommendations:  Activity:  as tolerated Other:  Keep all scheduled follow-up appointments as recommended.  Comments:  Take all your medications as prescribed. Report to your outpatient provider any adverse effects from medications promptly.  SignedArmandina Stammer I 10/18/2011, 2:40 PM

## 2011-10-18 NOTE — Progress Notes (Signed)
Patient ID: [name] [name], male   DOB: 1966-04-23, 45 y.o.   MRN: [id number] He has been discharged. Was going to go by bus to where he lives, was given a bus pass.  He voiced understanding of discharge instruction and of follow up. Denies thoughts of SI thought, All belonging taken home with time.

## 2011-10-20 NOTE — Progress Notes (Signed)
Patient Discharge Instructions:  After Visit Summary (AVS):   Faxed to:  10/20/2011 Psychiatric Admission Assessment Note:   Faxed to:  10/20/2011 Suicide Risk Assessment - Discharge Assessment:   Faxed to:  10/20/2011 Faxed/Sent to the Next Level Care provider:  10/20/2011  Faxed to [facility] @ [phone]  Heloise Purpura Eduard Clos, 10/20/2011, 5:17 PM

## 2011-12-02 ENCOUNTER — Encounter (HOSPITAL_COMMUNITY): Payer: Self-pay | Admitting: Emergency Medicine

## 2011-12-02 ENCOUNTER — Emergency Department (HOSPITAL_COMMUNITY)
Admission: EM | Admit: 2011-12-02 | Discharge: 2011-12-03 | Disposition: A | Discharge status: Home / Self Care | Payer: Medicaid Other | Attending: Emergency Medicine | Admitting: Emergency Medicine

## 2011-12-02 DIAGNOSIS — F1010 Alcohol abuse, uncomplicated: Secondary | ICD-10-CM | POA: Insufficient documentation

## 2011-12-02 DIAGNOSIS — F329 Major depressive disorder, single episode, unspecified: Secondary | ICD-10-CM | POA: Insufficient documentation

## 2011-12-02 DIAGNOSIS — F3289 Other specified depressive episodes: Secondary | ICD-10-CM | POA: Insufficient documentation

## 2011-12-02 LAB — CBC
HCT: 39.8 % (ref 39.0–52.0)
Hemoglobin: 14.1 g/dL (ref 13.0–17.0)
MCH: 31.3 pg (ref 26.0–34.0)
MCHC: 35.4 g/dL (ref 30.0–36.0)
MCV: 88.4 fL (ref 78.0–100.0)
Platelets: 208 10*3/uL (ref 150–400)
RBC: 4.5 MIL/uL (ref 4.22–5.81)
RDW: 14.3 % (ref 11.5–15.5)
WBC: 5.1 10*3/uL (ref 4.0–10.5)

## 2011-12-02 LAB — URINALYSIS, ROUTINE W REFLEX MICROSCOPIC
Bilirubin Urine: NEGATIVE
Glucose, UA: NEGATIVE mg/dL
Hgb urine dipstick: NEGATIVE
Ketones, ur: NEGATIVE mg/dL
Nitrite: NEGATIVE
Protein, ur: NEGATIVE mg/dL
Specific Gravity, Urine: 1.013 (ref 1.005–1.030)
Urobilinogen, UA: 0.2 mg/dL (ref 0.0–1.0)
pH: 5 (ref 5.0–8.0)

## 2011-12-02 LAB — RAPID URINE DRUG SCREEN, HOSP PERFORMED
Amphetamines: NOT DETECTED
Barbiturates: NOT DETECTED
Benzodiazepines: NOT DETECTED
Cocaine: NOT DETECTED
Opiates: NOT DETECTED
Tetrahydrocannabinol: POSITIVE — AB

## 2011-12-02 LAB — COMPREHENSIVE METABOLIC PANEL
ALT: 16 U/L (ref 0–53)
AST: 29 U/L (ref 0–37)
Albumin: 4.2 g/dL (ref 3.5–5.2)
Alkaline Phosphatase: 80 U/L (ref 39–117)
BUN: 13 mg/dL (ref 6–23)
CO2: 20 mEq/L (ref 19–32)
Calcium: 8.7 mg/dL (ref 8.4–10.5)
Chloride: 104 mEq/L (ref 96–112)
Creatinine, Ser: 1.1 mg/dL (ref 0.50–1.35)
GFR calc Af Amer: >90 mL/min (ref >90)
GFR calc non Af Amer: 78 mL/min — ABNORMAL LOW (ref >90)
Glucose, Bld: 86 mg/dL (ref 70–99)
Potassium: 3.6 mEq/L (ref 3.5–5.1)
Sodium: 139 mEq/L (ref 135–145)
Total Bilirubin: 0.4 mg/dL (ref 0.3–1.2)
Total Protein: 8 g/dL (ref 6.0–8.3)

## 2011-12-02 LAB — ETHANOL: Alcohol, Ethyl (B): 289 mg/dL — ABNORMAL HIGH (ref 0–11)

## 2011-12-02 MED ORDER — ALUM & MAG HYDROXIDE-SIMETH 200-200-20 MG/5ML PO SUSP: 30.0000 mL | ORAL | Status: DC | PRN

## 2011-12-02 MED ORDER — QUETIAPINE FUMARATE 100 MG PO TABS
100.0000 mg | ORAL_TABLET | Freq: Every day | ORAL | Status: DC
  Administered 2011-12-02: 100 mg via ORAL
  Filled 2011-12-02: qty 1

## 2011-12-02 MED ORDER — ACETAMINOPHEN 325 MG PO TABS: 650.0000 mg | ORAL_TABLET | ORAL | Status: DC | PRN

## 2011-12-02 MED ORDER — FLUOXETINE HCL 20 MG PO CAPS
20.0000 mg | ORAL_CAPSULE | Freq: Every day | ORAL | Status: DC
  Administered 2011-12-02: 20 mg via ORAL
  Filled 2011-12-02 (×2): qty 1

## 2011-12-02 MED ORDER — ZOLPIDEM TARTRATE 5 MG PO TABS
5.0000 mg | ORAL_TABLET | Freq: Every evening | ORAL | Status: DC | PRN
  Administered 2011-12-02: 5 mg via ORAL
  Filled 2011-12-02: qty 1

## 2011-12-02 MED ORDER — IBUPROFEN 600 MG PO TABS: 600.0000 mg | ORAL_TABLET | Freq: Three times a day (TID) | ORAL | Status: DC | PRN

## 2011-12-02 MED ORDER — LORAZEPAM 1 MG PO TABS: 1.0000 mg | ORAL_TABLET | Freq: Three times a day (TID) | ORAL | Status: DC | PRN

## 2011-12-02 MED ORDER — ONDANSETRON HCL 4 MG PO TABS: 4.0000 mg | ORAL_TABLET | Freq: Three times a day (TID) | ORAL | Status: DC | PRN

## 2011-12-02 NOTE — ED Notes (Signed)
Pt feel asleep during triage process-

## 2011-12-02 NOTE — ED Notes (Addendum)
Patient drinking sprite soda.

## 2011-12-02 NOTE — ED Notes (Signed)
Pt states he drank 2 40 oz beers, states he doesn't trust himself "out there" off meds for awhile-dpressed

## 2011-12-02 NOTE — ED Notes (Signed)
Pt states he wants detox from alcohol and drugs. Patient also states "I am  tired of living in hotels and wants to get my life straight. Patient states "I am under a lot of  stress.  Patient denies SI and HI at this time will continue to monitor patient.

## 2011-12-02 NOTE — ED Notes (Signed)
Pt states he wants detox from alcohol and drugs.  Says he is tired of living in hotels and wants to get his life straight.  Says he is under stress.  Denies SI/HI/AVH

## 2011-12-02 NOTE — BH Assessment (Addendum)
Assessment Note   [name] [name] is a 46 y.o. male who presents to [facility] voluntarily requesting treatment for alcohol abuse and endorsing depressive symptoms including decrease in concentration, crying spells, inability to get out of bed. He also endorses symptoms of anxiety including agitation, restlessness, racing thoughts, and avoidence of responsibilities. Pt was vauge in identifying recent stressors, stating he is under a lot of pressure. He also states that he has had a loss in his family, but did not want to disclose details pertainning to the loss.   Pt denies SI, HI, and AHVH. He reports no current psychiatrist or therapist and states he is not on any medication at this time. Pt reports he drinks 3 40oz beers 1 time weekly. He reports drinking 3 40oz beers today before presenting to the ED. He denies any other SA.  Telepsych has been requested to assist with disposition.         Axis I: Alcohol Abuse and Major Depression, Recurrent severe Axis II: Deferred Axis III:  Past Medical History  Diagnosis Date  . Chest pain, mid sternal   . CAD in native artery     a. Nonobstructive cath 11/2007;  b. Presented with ST elevation - Nonobstructive cath 08/2011  . GERD (gastroesophageal reflux disease)   . Tobacco abuse   . Marijuana abuse     a. uses ~ 1x /wk or less  . History of cocaine abuse     a. quit ? 2009  . History of ETOH abuse     a. drinks 2 "40's" / wk  . Bradycardia     a. asymptomatic  . Bipolar disorder   . Syncope     a. 12/2010 - presumed to be vasovagal  . Abnormal ECG     a. early repolarization  . Coronary artery disease    Axis IV: economic problems, housing problems, other psychosocial or environmental problems and problems related to social environment Axis V: 41-50 serious symptoms  Past Medical History:  Past Medical History  Diagnosis Date  . Chest pain, mid sternal   . CAD in native artery     a. Nonobstructive cath 11/2007;  b. Presented with ST  elevation - Nonobstructive cath 08/2011  . GERD (gastroesophageal reflux disease)   . Tobacco abuse   . Marijuana abuse     a. uses ~ 1x /wk or less  . History of cocaine abuse     a. quit ? 2009  . History of ETOH abuse     a. drinks 2 "40's" / wk  . Bradycardia     a. asymptomatic  . Bipolar disorder   . Syncope     a. 12/2010 - presumed to be vasovagal  . Abnormal ECG     a. early repolarization  . Coronary artery disease     History reviewed. No pertinent past surgical history.  Family History: No family history on file.  Social History:  reports that he has been smoking Cigarettes.  He has a 10 pack-year smoking history. He does not have any smokeless tobacco history on file. He reports that he drinks alcohol. He reports that he uses illicit drugs (Cocaine).  Additional Social History:  Alcohol / Drug Use History of alcohol / drug use?: Yes Substance #1 Name of Substance 1: Alcohol 1 - Age of First Use: 20s 1 - Amount (size/oz): 3 40oz 1 - Frequency: 1 a week 1 - Duration: 1 month 1 - Last Use / Amount: 12/02/11, 3  40oz   CIWA: CIWA-Ar BP: 92/54 mmHg Pulse Rate: 73  COWS:    Allergies: No Known Allergies  Home Medications:  (Not in a hospital admission)  OB/GYN Status:  No LMP for male patient.  General Assessment Data Location of Assessment: [facility] ED Living Arrangements: Alone Can pt return to current living arrangement?: Yes Admission Status: Voluntary Is patient capable of signing voluntary admission?: Yes Transfer from: Acute Hospital Referral Source: Self/Family/Friend  Education Status Is patient currently in school?: No Highest grade of school patient has completed: 10  Risk to self Suicidal Ideation: No Suicidal Intent: No Is patient at risk for suicide?: No Suicidal Plan?: No Access to Means: No What has been your use of drugs/alcohol within the last 12 months?: alcohol and crack cocaine Previous Attempts/Gestures: Yes How many times?: 1    Other Self Harm Risks: none Triggers for Past Attempts: Family contact Intentional Self Injurious Behavior: None Family Suicide History: No Recent stressful life event(s): Loss (Comment) Persecutory voices/beliefs?: No Depression: Yes Depression Symptoms: Despondent;Loss of interest in usual pleasures;Fatigue;Isolating;Tearfulness Substance abuse history and/or treatment for substance abuse?: Yes Suicide prevention information given to non-admitted patients: Not applicable  Risk to Others Homicidal Ideation: No Thoughts of Harm to Others: No Current Homicidal Intent: No Current Homicidal Plan: No Access to Homicidal Means: No Identified Victim: none History of harm to others?: No Assessment of Violence: None Noted Violent Behavior Description: cooperative Does patient have access to weapons?: No Criminal Charges Pending?: No Does patient have a court date: No  Psychosis Hallucinations: None noted Delusions: None noted  Mental Status Report Appear/Hygiene: Disheveled;Body odor Eye Contact: Fair Motor Activity: Unremarkable Speech: Logical/coherent Level of Consciousness: Alert Mood: Depressed Affect: Depressed Anxiety Level: None Thought Processes: Coherent;Relevant Judgement: Impaired Orientation: Person;Time;Place;Situation Obsessive Compulsive Thoughts/Behaviors: None  Cognitive Functioning Concentration: Normal Memory: Recent Intact;Remote Intact IQ: Average Insight: Poor Impulse Control: Fair Appetite: Fair Weight Loss: 0  Weight Gain: 0  Sleep: No Change Vegetative Symptoms: Staying in bed  ADLScreening [facility] Assessment Services) Patient's cognitive ability adequate to safely complete daily activities?: Yes Patient able to express need for assistance with ADLs?: Yes Independently performs ADLs?: Yes  Abuse/Neglect [facility]) Physical Abuse: Denies Verbal Abuse: Denies Sexual Abuse: Denies  Prior Inpatient Therapy Prior Inpatient Therapy: Yes Prior  Therapy Dates: 2012 and 10/2011 Prior Therapy Facilty/Provider(s): [facility] Reason for Treatment: depression, SI, and detox  Prior Outpatient Therapy Prior Outpatient Therapy: Yes Prior Therapy Facilty/Provider(s): [facility] Reason for Treatment: depression  ADL Screening (condition at time of admission) Patient's cognitive ability adequate to safely complete daily activities?: Yes Patient able to express need for assistance with ADLs?: Yes Independently performs ADLs?: Yes Weakness of Legs: None Weakness of Arms/Hands: None  Home Assistive Devices/Equipment Home Assistive Devices/Equipment: None    Abuse/Neglect Assessment (Assessment to be complete while patient is alone) Physical Abuse: Denies Verbal Abuse: Denies Sexual Abuse: Denies Exploitation of patient/patient's resources: Denies Self-Neglect: Denies Values / Beliefs Cultural Requests During Hospitalization: None Spiritual Requests During Hospitalization: None   Advance Directives (For Healthcare) Advance Directive: Patient does not have advance directive;Patient would not like information Pre-existing out of facility DNR order (yellow form or pink MOST form): No Nutrition Screen Diet: Regular Unintentional weight loss greater than 10lbs within the last month: No Problems chewing or swallowing foods and/or liquids: No Home Tube Feeding or Total Parenteral Nutrition (TPN): No Patient appears severely malnourished: No  Additional Information 1:1 In Past 12 Months?: No CIRT Risk: No Elopement Risk: No Does patient have  medical clearance?: Yes     Disposition:  Disposition Disposition of Patient: Other dispositions (pending telepsych)  Telepsych recommends pt be discharged with outpatient referrals. EDP notified and agrees with plan. Pt given outpatient referrals.   On Site Evaluation by:   Reviewed with Physician:     Georgina Quint A 12/02/2011 9:45 PM

## 2011-12-03 NOTE — ED Notes (Signed)
Patient denies pain and is resting comfortably.  

## 2011-12-03 NOTE — ED Provider Notes (Signed)
Pt has been evaluated by act team and psychiatrist/telepsych consult by Dr Jacky Kindle. Psychiatrist indicates pt stable for d/c home. Pt comfortable. No distress. No tremor or shakes.  Will provide resource guide and encourage outpt follow up with rehab program and primary care provider.   Suzi Roots, MD 12/03/11 [phone]

## 2011-12-03 NOTE — ED Notes (Signed)
Patient request something for sleep. Patient denies pain at this time.

## 2011-12-03 NOTE — ED Notes (Signed)
Telepysch completed.  

## 2011-12-03 NOTE — Discharge Instructions (Signed)
Follow up with the [facility] later this morning.  You may also use resource guide provided. Also follow up with primary care doctor in coming week.  Return to ER if worse, new symptoms, other concern.      Alcohol Intoxication You have alcohol intoxication when the amount of alcohol that you have consumed has impaired your ability to mentally and physically function. There are a variety of factors that contribute to the level at which alcohol intoxication can occur, such as age, gender, weight, frequency of alcohol consumption, medication use, and the presence of other medical conditions, such as diabetes, seizures, or heart conditions. The blood alcohol level test measures the concentration of alcohol in your blood. In most states, your blood alcohol level must be lower than 80 mg/dL (1.61%) to legally drive. However, many dangerous effects of alcohol can occur at much lower levels. Alcohol directly impairs the normal chemical activity of the brain and is said to be a chemical depressant. Alcohol can cause drowsiness, stupor, respiratory failure, and coma. Other physical effects can include headache, vomiting, vomiting of blood, abdominal pain, a fast heartbeat, difficulty breathing, anxiety, and amnesia. Alcohol intoxication can also lead to dangerous and life-threatening activities, such as fighting, dangerous operation of vehicles or heavy machinery, and risky sexual behavior. Alcohol can be especially dangerous when taken with other drugs. Some of these drugs are:  Sedatives.   Painkillers.   Marijuana.   Tranquilizers.   Antihistamines.   Muscle relaxants.   Seizure medicine.  Many of the effects of acute alcohol intoxication are temporary. However, repeated alcohol intoxication can lead to severe medical illnesses. If you have alcohol intoxication, you should:  Stay hydrated. Drink enough water and fluids to keep your urine clear or pale yellow. Avoid excessive caffeine  because this can further lead to dehydration.   Eat a healthy diet. You may have residual nausea, headache, and loss of appetite, but it is still important that you maintain good nutrition. You can start with clear liquids.   Take nonsteroidal anti-inflammatory medications as needed for headaches, but make sure to do so with small meals. You should avoid acetaminophen for several days after having alcohol intoxication because the combination of alcohol and acetaminophen can be toxic to your liver.  If you have frequent alcohol intoxication, ask your friends and family if they think you have a drinking problem. For further help, contact:  Your caregiver.   Alcoholics Anonymous (AA).   A drug or alcohol rehabilitation program.  SEEK MEDICAL CARE IF:   You have persistent vomiting.   You have persistent pain in any part of your body.   You do not feel better after a few days.  SEEK IMMEDIATE MEDICAL CARE IF:   You become shaky or tremble when you try to stop drinking.   You shake uncontrollably (seizure).   You throw up (vomit) blood. This may be bright red or it may look like black coffee grounds.   You have blood in the stool. This may be bright red or appear as a black, tarry, bad smelling stool.   You become lightheaded or faint.  ANY OF THESE SYMPTOMS MAY REPRESENT A SERIOUS PROBLEM THAT IS AN EMERGENCY. Do not wait to see if the symptoms will go away. Get medical help right away. Call your local emergency services (911 in [place] DO NOT drive yourself to the hospital. MAKE SURE YOU:   Understand these instructions.   Will watch your condition.   Will get help  right away if you are not doing well or get worse.  Document Released: 03/03/2005 Document Revised: 05/13/2011 Document Reviewed: 11/10/2009 [facility] Patient Information 2012 [place], [place].       Alcohol Problems Most adults who drink alcohol drink in moderation (not a lot) are at low risk for developing  problems related to their drinking. However, all drinkers, including low-risk drinkers, should know about the health risks connected with drinking alcohol. RECOMMENDATIONS FOR LOW-RISK DRINKING  Drink in moderation. Moderate drinking is defined as follows:   Men - no more than 2 drinks per day.   Nonpregnant women - no more than 1 drink per day.   Over age 26 - no more than 1 drink per day.  A standard drink is 12 grams of pure alcohol, which is equal to a 12 ounce bottle of beer or wine cooler, a 5 ounce glass of wine, or 1.5 ounces of distilled spirits (such as whiskey, brandy, vodka, or rum).  ABSTAIN FROM (DO NOT DRINK) ALCOHOL:  When pregnant or considering pregnancy.   When taking a medication that interacts with alcohol.   If you are alcohol dependent.   A medical condition that prohibits drinking alcohol (such as ulcer, liver disease, or heart disease).  DISCUSS WITH YOUR CAREGIVER:  If you are at risk for coronary heart disease, discuss the potential benefits and risks of alcohol use: Light to moderate drinking is associated with lower rates of coronary heart disease in certain populations (for example, men over age 58 and postmenopausal women). Infrequent or nondrinkers are advised not to begin light to moderate drinking to reduce the risk of coronary heart disease so as to avoid creating an alcohol-related problem. Similar protective effects can likely be gained through proper diet and exercise.   Women and the elderly have smaller amounts of body water than men. As a result women and the elderly achieve a higher blood alcohol concentration after drinking the same amount of alcohol.   Exposing a fetus to alcohol can cause a broad range of birth defects referred to as Fetal Alcohol Syndrome (FAS) or Alcohol-Related Birth Defects (ARBD). Although FAS/ARBD is connected with excessive alcohol consumption during pregnancy, studies also have reported neurobehavioral problems in infants  born to mothers reporting drinking an average of 1 drink per day during pregnancy.   Heavier drinking (the consumption of more than 4 drinks per occasion by men and more than 3 drinks per occasion by women) impairs learning (cognitive) and psychomotor functions and increases the risk of alcohol-related problems, including accidents and injuries.  CAGE QUESTIONS:   Have you ever felt that you should Cut down on your drinking?   Have people Annoyed you by criticizing your drinking?   Have you ever felt bad or Guilty about your drinking?   Have you ever had a drink first thing in the morning to steady your nerves or get rid of a hangover (Eye opener)?  If you answered positively to any of these questions: You may be at risk for alcohol-related problems if alcohol consumption is:   Men: Greater than 14 drinks per week or more than 4 drinks per occasion.   Women: Greater than 7 drinks per week or more than 3 drinks per occasion.  Do you or your family have a medical history of alcohol-related problems, such as:  Blackouts.   Sexual dysfunction.   Depression.   Trauma.   Liver dysfunction.   Sleep disorders.   Hypertension.   Chronic abdominal pain.  Has your drinking ever caused you problems, such as problems with your family, problems with your work (or school) performance, or accidents/injuries?   Do you have a compulsion to drink or a preoccupation with drinking?   Do you have poor control or are you unable to stop drinking once you have started?   Do you have to drink to avoid withdrawal symptoms?   Do you have problems with withdrawal such as tremors, nausea, sweats, or mood disturbances?   Does it take more alcohol than in the past to get you high?   Do you feel a strong urge to drink?   Do you change your plans so that you can have a drink?   Do you ever drink in the morning to relieve the shakes or a hangover?  If you have answered a number of the previous  questions positively, it may be time for you to talk to your caregivers, family, and friends and see if they think you have a problem. Alcoholism is a chemical dependency that keeps getting worse and will eventually destroy your health and relationships. Many alcoholics end up dead, impoverished, or in prison. This is often the end result of all chemical dependency.  Do not be discouraged if you are not ready to take action immediately.   Decisions to change behavior often involve up and down desires to change and feeling like you cannot decide.   Try to think more seriously about your drinking behavior.   Think of the reasons to quit.  WHERE TO GO FOR ADDITIONAL INFORMATION   The National Institute on Alcohol Abuse and Alcoholism (NIAAA)www.niaaa.nih.gov   [employer] on Alcoholism and Drug Dependence (NCADD)www.ncadd.org   American Society of Addiction Medicine (ASAM)www.https://anderson-Vellucci.com/  Document Released: 05/24/2005 Document Revised: 05/13/2011 Document Reviewed: 01/10/2008 [facility] Patient Information 2012 [place], [place].     RESOURCE GUIDE  Chronic Pain Problems: Contact [facility]  [phone] Patients need to be referred by their primary care doctor.  Insufficient Money for Medicine: Contact United Way:  call "211" or [facility] [phone].  No Primary Care Doctor: - Call [facility] Connect  [phone] - can help you locate a primary care doctor that  accepts your insurance, provides certain services, etc. - Physician Referral [phone]  Agencies that provide inexpensive medical care: - [facility]  [phone] - [facility]  [phone] - [facility]  [phone] [facility]  [phone] - [facility]  [phone] [facility]  [phone]  Medicaid-accepting [facility] Providers: - [facility]- [address]  [phone], Mon-Fri 9am-7pm,  Sat 9am-1pm - [facility]- [address] [place], [address] [place]  [phone] - [facility]- [address], [address] [place]  [phone] [facility]- [address]  [phone] - Renaye Rakers- [address] [place], [address], [phone]  Only accepts [place] patients after they have their name  applied to their card  Self Pay (no insurance) in [place]: - Sickle Cell Patients: Dr Willey Blade, [facility]  [address] [place], [phone] - [facility]- [address] [place]  [phone]       [facility] [place]- [address]       -     [employer]- see information above (Speak  to Phylliss Blakes if you do not have insurance)       -  [facility]- [address] [place], [phone]       -  [facility]- [address],  [phone]       -  [facility]- [address], [phone]       -  Dr Julio Sicks-  [address], [address], [place], [phone]       -  [facility]- [address] [phone]       -  [facility]- [address] [phone], also [address], [phone]       -    [facility]- [address] [place], [phone], 1st & 3rd Saturday   every month, 10am-1pm  1) Find a Doctor and Pay Out of Pocket Although you won't have to find out who is covered by your insurance plan, it is a good idea to ask around and get recommendations. You will then need to call the office and see if the doctor you have chosen will accept you as a new patient and what types of options they offer for patients who are self-pay. Some doctors offer discounts or will set up payment plans for their patients who do not have insurance, but you will need to ask so you aren't surprised when you get to your appointment.  2) Contact Your Local Health Department Not all health departments have doctors that can see patients  for sick visits, but many do, so it is worth a call to see if yours does. If you don't know where your local health department is, you can check in your phone book. The CDC also has a tool to help you locate your state's health department, and many state websites also have listings of all of their local health departments.  3) Find a Walk-in Clinic If your illness is not likely to be very severe or complicated, you may want to try a walk in clinic. These are popping up all over the country in pharmacies, drugstores, and shopping centers. They're usually staffed by nurse practitioners or physician assistants that have been trained to treat common illnesses and complaints. They're usually fairly quick and inexpensive. However, if you have serious medical issues or chronic medical problems, these are probably not your best option  STD Testing - [facility] Department of [facility] [place], STD Clinic, [address], [place], phone [phone] or [phone].  Monday - Friday, call for an appointment. [facility] Department of [employer], STD Clinic, [address], [place], phone [phone] or [phone].  Monday - Friday, call for an appointment.  Abuse/Neglect: [facility] Child Abuse Hotline [phone] [facility] Child Abuse Hotline [phone] (After Hours)  Emergency Shelter:  [facility] 629-200-6439  Maternity Homes: - [facility] [phone] - [facility] [phone]  MRSA Hotline #:   (564)441-1771  [facility] Resources  [facility]  United Way [facility] Dept. [address]                 [address]         [address] [place] [address]  [address]  Cristobal Goldmann Phone:  [phone]                                  Phone:  [phone]                   Phone:   [phone]  [facility], [phone] - [phone]       -     [facility] in [place], [address],                                  [phone], [facility] Child Abuse Hotline 779-857-4882 or 3325855118 (After Hours)   Behavioral Health Services  Substance Abuse Resources: - [facility] Drug [facility]  [phone] - [facility] [phone] - [facility] [place] [phone] Floydene Flock [phone] - Residential & Outpatient Substance Abuse Program  [phone]  Psychological Services: [facility]  [phone] [facility]  [phone] - [facility], [phone] [place]. [address] [place], ACCESS LINE: 336-841-2016 or [phone], [employer]ur[employer]an.co.za  Dental Assistance  If unable to pay or uninsured, contact:  [facility] or [facility]. to become qualified for the adult dental clinic.  Patients with Medicaid: [facility] [phone] [address], [phone] [address] [phone]  If unable to pay, or uninsured, contact [facility] [phone]) or [facility] Department [phone] in [place], [phone] in [facility]) to become qualified for the adult dental clinic  Other Low-Cost Community Dental Services: - [facility]- [address] [place], [place], [place], [address], [phone], [phone], 2nd and 4th Thursday of the month at 6:30am.  10 clients each day by appointment, can sometimes see walk-in patients if someone does not show for an appointment. [facility]- [address] [place] [address] Ether Griffins [place], [place], [address], [phone] - [facility]- [address], [place], [place], [address], [phone] - [facility]- [phone] [facility]- [phone] [facility] Department- [phone]

## 2011-12-03 NOTE — ED Notes (Signed)
Patient resting quietly with eyes closed respirations equal and unlabored. Skin warm and dry. No acute distress noted.

## 2011-12-03 NOTE — ED Notes (Signed)
Patient discharge via ambulatory with steady gait. Respirations equal and unlabored. Skin warm and dry. No acute distress noted. 

## 2011-12-13 ENCOUNTER — Encounter (HOSPITAL_COMMUNITY): Payer: Self-pay | Admitting: Emergency Medicine

## 2011-12-13 ENCOUNTER — Emergency Department (HOSPITAL_COMMUNITY): Payer: Medicaid Other

## 2011-12-13 ENCOUNTER — Other Ambulatory Visit: Payer: Self-pay

## 2011-12-13 ENCOUNTER — Emergency Department (HOSPITAL_COMMUNITY)
Admission: EM | Admit: 2011-12-13 | Discharge: 2011-12-13 | Disposition: A | Discharge status: Home / Self Care | Payer: Medicaid Other | Attending: Emergency Medicine | Admitting: Emergency Medicine

## 2011-12-13 DIAGNOSIS — R0602 Shortness of breath: Secondary | ICD-10-CM | POA: Insufficient documentation

## 2011-12-13 DIAGNOSIS — I2510 Atherosclerotic heart disease of native coronary artery without angina pectoris: Secondary | ICD-10-CM | POA: Insufficient documentation

## 2011-12-13 DIAGNOSIS — R079 Chest pain, unspecified: Secondary | ICD-10-CM | POA: Insufficient documentation

## 2011-12-13 DIAGNOSIS — R071 Chest pain on breathing: Secondary | ICD-10-CM | POA: Insufficient documentation

## 2011-12-13 DIAGNOSIS — R0789 Other chest pain: Secondary | ICD-10-CM

## 2011-12-13 LAB — BASIC METABOLIC PANEL
BUN: 10 mg/dL (ref 6–23)
CO2: 28 mEq/L (ref 19–32)
GFR calc non Af Amer: 70 mL/min — ABNORMAL LOW (ref >90)
Glucose, Bld: 85 mg/dL (ref 70–99)
Potassium: 4.3 mEq/L (ref 3.5–5.1)

## 2011-12-13 LAB — CBC WITH DIFFERENTIAL/PLATELET
Eosinophils Absolute: 0.1 10*3/uL (ref 0.0–0.7)
Eosinophils Relative: 2 % (ref 0–5)
Hemoglobin: 13.3 g/dL (ref 13.0–17.0)
Lymphocytes Relative: 24 % (ref 12–46)
Lymphs Abs: 1.2 10*3/uL (ref 0.7–4.0)
MCH: 30.9 pg (ref 26.0–34.0)
MCV: 89.6 fL (ref 78.0–100.0)
Monocytes Relative: 11 % (ref 3–12)
RBC: 4.31 MIL/uL (ref 4.22–5.81)
WBC: 4.9 10*3/uL (ref 4.0–10.5)

## 2011-12-13 MED ORDER — MORPHINE SULFATE 4 MG/ML IJ SOLN
4.0000 mg | Freq: Once | INTRAMUSCULAR | Status: AC
  Administered 2011-12-13: 4 mg via INTRAVENOUS
  Filled 2011-12-13: qty 2

## 2011-12-13 MED ORDER — KETOROLAC TROMETHAMINE 30 MG/ML IJ SOLN
30.0000 mg | Freq: Once | INTRAMUSCULAR | Status: AC
  Administered 2011-12-13: 30 mg via INTRAVENOUS
  Filled 2011-12-13: qty 1

## 2011-12-13 MED ORDER — HYDROCODONE-ACETAMINOPHEN 5-325 MG PO TABS: 1.0000 | ORAL_TABLET | ORAL | Status: DC | PRN

## 2011-12-13 NOTE — ED Notes (Signed)
Pt. Reports left sided, under axilla rib pain. States "I felt it a little last night, but I was on a walk today and it got worse". Reports nausea this AM that has since resolved. Reports pain worse with deep breaths. Pain 8/10. Pt. Resting in bed.

## 2011-12-13 NOTE — ED Provider Notes (Signed)
History     CSN: [id number]  Arrival date & time 12/13/11  1159   First MD Initiated Contact with Patient 12/13/11 1248      Chief Complaint  Patient presents with  . Chest Pain    (Consider location/radiation/quality/duration/timing/severity/associated sxs/prior treatment) Patient is a 46 y.o. male presenting with chest pain. The history is provided by the patient.  Chest Pain The chest pain began yesterday. Chest pain occurs rarely. The chest pain is unchanged. The quality of the pain is described as sharp. Pertinent negatives for primary symptoms include no fever, no shortness of breath, no cough, no abdominal pain, no nausea and no vomiting. Associated symptoms comments: He reports one episode sharp left lateral chest pain last night that resolved without resolution. The pain returned today and he felt he should have it evaluated. He reports heart catheterization about one month ago and reports stents were placed. He says the pain today and last night is unlike the pain in his chest prior to catheterization. No shortness of breath, cough or fever. He denies history of blood clots. No nausea or vomiting. He is not having pain at this time..  His past medical history is significant for CAD.     Past Medical History  Diagnosis Date  . Chest pain, mid sternal   . CAD in native artery     a. Nonobstructive cath 11/2007;  b. Presented with ST elevation - Nonobstructive cath 08/2011  . GERD (gastroesophageal reflux disease)   . Tobacco abuse   . Marijuana abuse     a. uses ~ 1x /wk or less  . History of cocaine abuse     a. quit ? 2009  . History of ETOH abuse     a. drinks 2 "40's" / wk  . Bradycardia     a. asymptomatic  . Bipolar disorder   . Syncope     a. 12/2010 - presumed to be vasovagal  . Abnormal ECG     a. early repolarization  . Coronary artery disease     History reviewed. No pertinent past surgical history.  No family history on file.  History  Substance Use  Topics  . Smoking status: Current Everyday Smoker -- 0.5 packs/day for 20 years    Types: Cigarettes  . Smokeless tobacco: Not on file   Comment: Smokes about 3 cigarettes/day  . Alcohol Use: Yes     drinks 5, 40 oz beers/wk      Review of Systems  Constitutional: Negative for fever.  Respiratory: Negative for cough and shortness of breath.   Cardiovascular: Positive for chest pain.  Gastrointestinal: Negative for nausea, vomiting and abdominal pain.    Allergies  Review of patient's allergies indicates no known allergies.  Home Medications   Current Outpatient Rx  Name Route Sig Dispense Refill  . FLUOXETINE HCL 20 MG PO CAPS Oral Take 20 mg by mouth daily.    [place] Kitchen NITROGLYCERIN 0.4 MG SL SUBL Sublingual Place 1 tablet (0.4 mg total) under the tongue every 5 (five) minutes as needed. For chest pain      BP 111/67  Pulse 48  Temp 97.9 F (36.6 C) (Oral)  Resp 18  SpO2 96%  Physical Exam  Constitutional: He is oriented to person, place, and time. He appears well-developed and well-nourished. No distress.  Pulmonary/Chest: Effort normal. He has no wheezes. He has no rales. He exhibits tenderness.       Mild tenderness at left lateral chest wall at mid-axillary  line. No mass, swelling.   Abdominal: Soft. There is no tenderness.  Musculoskeletal: He exhibits no edema.  Neurological: He is alert and oriented to person, place, and time.  Skin: Skin is warm and dry.    ED Course  Procedures (including critical care time)  Labs Reviewed  CBC WITH DIFFERENTIAL - Abnormal; Notable for the following:    HCT 38.6 (*)     All other components within normal limits  BASIC METABOLIC PANEL - Abnormal; Notable for the following:    GFR calc non Af Amer 70 (*)     GFR calc Af Amer 81 (*)     All other components within normal limits  TROPONIN I   Dg Chest Portable 1 View  12/13/2011  *RADIOLOGY REPORT*  Clinical Data: Left-sided chest pain, shortness of breath for several days   PORTABLE CHEST - 1 VIEW  Comparison: Chest x-ray of 09/19/2011  Findings: The lungs are clear.  No infiltrate or effusion is seen. The heart is within upper limits of normal.  No bony abnormality is seen.  IMPRESSION: No active lung disease.  Original Report Authenticated By: Juline Patch, M.D.   Results for orders placed during the hospital encounter of 12/13/11  CBC WITH DIFFERENTIAL      Component Value Range   WBC 4.9  4.0 - 10.5 K/uL   RBC 4.31  4.22 - 5.81 MIL/uL   Hemoglobin 13.3  13.0 - 17.0 g/dL   HCT 16.1 (*) 09.6 - 04.5 %   MCV 89.6  78.0 - 100.0 fL   MCH 30.9  26.0 - 34.0 pg   MCHC 34.5  30.0 - 36.0 g/dL   RDW 40.9  81.1 - 91.4 %   Platelets 188  150 - 400 K/uL   Neutrophils Relative 63  43 - 77 %   Neutro Abs 3.1  1.7 - 7.7 K/uL   Lymphocytes Relative 24  12 - 46 %   Lymphs Abs 1.2  0.7 - 4.0 K/uL   Monocytes Relative 11  3 - 12 %   Monocytes Absolute 0.5  0.1 - 1.0 K/uL   Eosinophils Relative 2  0 - 5 %   Eosinophils Absolute 0.1  0.0 - 0.7 K/uL   Basophils Relative 0  0 - 1 %   Basophils Absolute 0.0  0.0 - 0.1 K/uL  BASIC METABOLIC PANEL      Component Value Range   Sodium 141  135 - 145 mEq/L   Potassium 4.3  3.5 - 5.1 mEq/L   Chloride 105  96 - 112 mEq/L   CO2 28  19 - 32 mEq/L   Glucose, Bld 85  70 - 99 mg/dL   BUN 10  6 - 23 mg/dL   Creatinine, Ser 7.82  0.50 - 1.35 mg/dL   Calcium 8.9  8.4 - 95.6 mg/dL   GFR calc non Af Amer 70 (*) >90 mL/min   GFR calc Af Amer 81 (*) >90 mL/min  TROPONIN I      Component Value Range   Troponin I <0.30  <0.30 ng/mL      No diagnosis found. 1. Chest wall pain 2. History recent catheterization    MDM  Records reviewed. Catheterization performed in March, 2013, and show widely patent arteries, normal ventricular function and an EF of 55%. Symptoms different from previous chest pain thought cardiac pain and mildly reproucible, suggesting chest wall etiology. Negative lab and EKG, clear chest. Discussed with Trish with  [facility] cardiology  who sets up appointment for patient in office on 12-15-11 at 3:55.        Rodena Medin, PA-C 12/13/11 1557

## 2011-12-13 NOTE — ED Provider Notes (Signed)
12:07 PM  Date: 12/13/2011  Rate: 60  Rhythm: normal sinus rhythm  QRS Axis: normal  Intervals: normal  ST/T Wave abnormalities: normal  Conduction Disutrbances:none  Narrative Interpretation: Normal EKG  Old EKG Reviewed: unchanged    Carleene Cooper III, MD 12/13/11 1208

## 2011-12-13 NOTE — ED Notes (Signed)
Patients heart rate drop down to high 40's.  Rn/jody advised.

## 2011-12-13 NOTE — Progress Notes (Signed)
Medical screening examination/treatment/procedure(s) were conducted as a shared visit with non-physician practitioner(s) and myself.  I personally evaluated the patient during the encounter Pt is 46 yo man with chest pain.  He had had chest pain and had a cardiac cath in March that showed non occlusive disease and was advised medical therapy.  Lab workup today was negative.  Clinical Dx is chest wall pain.  Call to Daun Peacock at [facility] on Wednesday, 12/16/11 at 3:55 P.M.

## 2011-12-13 NOTE — ED Notes (Signed)
Prescription given with discharge instructions.  

## 2011-12-13 NOTE — ED Notes (Signed)
Per EMS, pt. Reports left sided, under axilla rib pain. Felt slight pain last night but was worse today.

## 2011-12-14 NOTE — ED Provider Notes (Signed)
Medical screening examination/treatment/procedure(s) were conducted as a shared visit with non-physician practitioner(s) and myself.  I personally evaluated the patient during the encounter 46 yo man had sharp left lateral chest pain, on and off since yesterday.  Prior workup for chest pain in March 2013 with cardiac cath then showing normal coronary arteries.  Exam today shows pain localized to left lateral chest.  EKG, chest x-ray, cardiac enzymes all normal.  Call to Daun Peacock, MSN, [facility], who made pt a followup appointment in their office.  Carleene Cooper III, MD 12/14/11 1036

## 2011-12-15 ENCOUNTER — Encounter: Payer: Self-pay | Admitting: Physician Assistant

## 2011-12-15 ENCOUNTER — Ambulatory Visit (INDEPENDENT_AMBULATORY_CARE_PROVIDER_SITE_OTHER): Payer: Medicaid Other | Admitting: Physician Assistant

## 2011-12-15 VITALS — BP 102/60 | HR 45 | Ht 64.0 in | Wt 116.0 lb

## 2011-12-15 DIAGNOSIS — R0789 Other chest pain: Secondary | ICD-10-CM

## 2011-12-15 DIAGNOSIS — R001 Bradycardia, unspecified: Secondary | ICD-10-CM

## 2011-12-15 DIAGNOSIS — I498 Other specified cardiac arrhythmias: Secondary | ICD-10-CM

## 2011-12-15 DIAGNOSIS — I2510 Atherosclerotic heart disease of native coronary artery without angina pectoris: Secondary | ICD-10-CM

## 2011-12-15 DIAGNOSIS — R072 Precordial pain: Secondary | ICD-10-CM

## 2011-12-15 DIAGNOSIS — K219 Gastro-esophageal reflux disease without esophagitis: Secondary | ICD-10-CM

## 2011-12-15 MED ORDER — PRAVASTATIN SODIUM 20 MG PO TABS: 20.0000 mg | ORAL_TABLET | Freq: Every evening | ORAL | Status: DC

## 2011-12-15 MED ORDER — FAMOTIDINE 20 MG PO TABS: 20.0000 mg | ORAL_TABLET | Freq: Two times a day (BID) | ORAL | Status: DC

## 2011-12-15 NOTE — Progress Notes (Signed)
[address] [place], [place]  [address] Phone: [phone] Fax:  [phone]  Date:  12/15/2011   Name:  [name] [name]   DOB:  07/23/1965   MRN:  [id number]  PCP:  No primary provider on file.  Primary Cardiologist:  Dr. Arvilla Meres  Primary Electrophysiologist:  None    History of Present Illness: [name] [name] is a 46 y.o. male He has a history of nonobstructive CAD, bipolar disorder, GERD, polysubstance abuse.  Cardiac catheterization 2009 demonstrated nonobstructive disease.  He had been lost to followup since.  He presented to the hospital 08/16/11 with chest pain and ECG concerning for STEMI.  Code STEMI was called and he was taken straight to the cardiac catheterization lab.  LHC 08/16/11: mLAD 40-50%, EF 55%.  Chest pain resolved with GI cocktail.  D-dimer was normal.  He was discharged on Zantac for acid reduction.  He has not been seen by our service since that time.  He was seen in the emergency room 12/13/11 with chest pain.  Cardiac enzymes, renal function, hemoglobin and chest x-ray were all unremarkable.  ECG was unchanged from prior.  He was set up for followup with me today.  His chest pain was described as blunt and sharp on the left side.  No radiating symptoms or associated shortness of breath.  He did feel nauseated and diaphoretic.  He denies exertional chest pain.  He denies orthopnea, PND or edema.  He denies syncope. However, he does describe an episode of syncope some 4-5 months ago.  This was prior to his last heart catheterization.  He describes class 1-2 symptoms.  He actually walked about 3 hours to our office today without limitations.    Wt Readings from Last 3 Encounters:  12/15/11 116 lb (52.617 kg)  10/14/11 116 lb (52.617 kg)  08/17/11 118 lb 9.7 oz (53.8 kg)     Potassium  Date/Time Value Range Status  12/13/2011  2:00 PM 4.3  3.5 - 5.1 mEq/L Final     Creatinine, Ser  Date/Time Value Range Status  12/13/2011  2:00 PM  1.21  0.50 - 1.35 mg/dL Final     ALT  Date/Time Value Range Status  12/02/2011  3:42 PM 16  0 - 53 U/L Final     TSH  Date/Time Value Range Status  08/16/2011  3:37 PM 1.095  0.350 - 4.500 uIU/mL Final     Hemoglobin  Date/Time Value Range Status  12/13/2011  2:00 PM 13.3  13.0 - 17.0 g/dL Final    Past Medical History  Diagnosis Date  . Chest pain, mid sternal   . CAD in native artery     a. Nonobstructive cath 11/2007;  b. Presented with ST elevation - Nonobstructive cath 08/2011  . GERD (gastroesophageal reflux disease)   . Tobacco abuse   . Marijuana abuse     a. uses ~ 1x /wk or less  . History of cocaine abuse     a. quit ? 2009  . History of ETOH abuse     a. drinks 2 "40's" / wk  . Bradycardia     a. asymptomatic  . Bipolar disorder   . Syncope     a. 12/2010 - presumed to be vasovagal  . Abnormal ECG     a. early repolarization  . Coronary artery disease    Medications: No current outpatient prescriptions on file.    Allergies: No Known Allergies  History  Substance Use  Topics  . Smoking status: Current Everyday Smoker -- 0.5 packs/day for 20 years    Types: Cigarettes  . Smokeless tobacco: Not on file   Comment: Smokes about 3 cigarettes/day  . Alcohol Use: Yes     drinks 5, 40 oz beers/wk  Last used cocaine about one month ago  ROS:  Please see the history of present illness.   He notes occasional belching as well as waterbrash symptoms.  All other systems reviewed and negative.   PHYSICAL EXAM: VS:  BP 102/60  Pulse 45  Ht 5\' 4"  (1.626 m)  Wt 116 lb (52.617 kg)  BMI 19.91 kg/m2 Well nourished, well developed, in no acute distress HEENT: normal Neck: no JVD Vascular: No carotid bruits Endocrine: No thyromegaly Cardiac:  normal S1, S2; RRR; no murmur Lungs:  clear to auscultation bilaterally, no wheezing, rhonchi or rales Abd: soft, nontender, no hepatomegaly Ext: no edema Skin: warm and dry Neuro:  CNs 2-12 intact, no focal abnormalities  noted  EKG:  Sinus bradycardia, heart rate 49, normal axis, early repolarization, no significant change when compared to prior tracings      ASSESSMENT AND PLAN:  1.  Chest pain Atypical.  This does not appear to be cardiac.  He had mild nonobstructive coronary artery disease a cardiac catheterization in March.  No further cardiac workup is warranted at this time.  Suspect his symptoms are GI in nature.  Also consider musculoskeletal pain.  2.  Non-Obstructive CAD Risk factor modification. We discussed the importance of statin therapy. Start pravastatin 20 mg q.h.s. Aspirin 81 mg daily. He can follow up with me in 6 months.  3.  Bradycardia Not symptomatic. Suspect episode 4-5 mos ago was vagal. Monitor for now.  4.  GERD Start Pepcid 20 mg bid. Follow up with PCP at [facility] in 4-6 weeks.   Luna Glasgow, PA-C  4:03 PM 12/15/2011

## 2011-12-15 NOTE — Addendum Note (Signed)
Addended by: Tarri Fuller on: 12/15/2011 04:46 PM   Modules accepted: Orders

## 2011-12-15 NOTE — Patient Instructions (Addendum)
Your physician has recommended you make the following change in your medication: START PRAVASTATIN 20 MG EVER NIGHT; START ASPIRIN 81 MG DAILY; START PEPCID 20 MG TWICE DAILY   Your physician recommends that you return for lab work in: 02/04/12 FASTING LIPID AND LIVER PANEL   FOLLOW UP WITH PCP @ [facility]  Your physician wants you to follow-up in: 6 MONTHS WITH SCOTT WEAVER, PAC. You will receive a reminder letter in the mail two months in advance. If you don't receive a letter, please call our office to schedule the follow-up appointment.

## 2012-01-10 ENCOUNTER — Emergency Department (HOSPITAL_COMMUNITY)
Admission: EM | Admit: 2012-01-10 | Discharge: 2012-01-11 | Disposition: A | Discharge status: Other Acute Inpatient Hospital | Payer: Medicaid Other | Attending: Emergency Medicine | Admitting: Emergency Medicine

## 2012-01-10 ENCOUNTER — Encounter (HOSPITAL_COMMUNITY): Payer: Self-pay | Admitting: *Deleted

## 2012-01-10 DIAGNOSIS — F319 Bipolar disorder, unspecified: Secondary | ICD-10-CM | POA: Insufficient documentation

## 2012-01-10 DIAGNOSIS — F1910 Other psychoactive substance abuse, uncomplicated: Secondary | ICD-10-CM | POA: Insufficient documentation

## 2012-01-10 DIAGNOSIS — R45851 Suicidal ideations: Secondary | ICD-10-CM | POA: Insufficient documentation

## 2012-01-10 DIAGNOSIS — F1010 Alcohol abuse, uncomplicated: Secondary | ICD-10-CM | POA: Insufficient documentation

## 2012-01-10 DIAGNOSIS — Z7982 Long term (current) use of aspirin: Secondary | ICD-10-CM | POA: Insufficient documentation

## 2012-01-10 DIAGNOSIS — F17200 Nicotine dependence, unspecified, uncomplicated: Secondary | ICD-10-CM | POA: Insufficient documentation

## 2012-01-10 DIAGNOSIS — I2510 Atherosclerotic heart disease of native coronary artery without angina pectoris: Secondary | ICD-10-CM | POA: Insufficient documentation

## 2012-01-10 LAB — COMPREHENSIVE METABOLIC PANEL
ALT: 15 U/L (ref 0–53)
AST: 30 U/L (ref 0–37)
Albumin: 3.6 g/dL (ref 3.5–5.2)
Alkaline Phosphatase: 89 U/L (ref 39–117)
CO2: 23 mEq/L (ref 19–32)
Chloride: 105 mEq/L (ref 96–112)
GFR calc non Af Amer: 75 mL/min — ABNORMAL LOW (ref >90)
Potassium: 4 mEq/L (ref 3.5–5.1)
Sodium: 140 mEq/L (ref 135–145)
Total Bilirubin: 0.3 mg/dL (ref 0.3–1.2)

## 2012-01-10 LAB — RAPID URINE DRUG SCREEN, HOSP PERFORMED
Amphetamines: NOT DETECTED
Barbiturates: NOT DETECTED
Tetrahydrocannabinol: POSITIVE — AB

## 2012-01-10 LAB — CBC
MCV: 88.3 fL (ref 78.0–100.0)
Platelets: 201 10*3/uL (ref 150–400)
RBC: 4.62 MIL/uL (ref 4.22–5.81)
RDW: 13.5 % (ref 11.5–15.5)
WBC: 3.9 10*3/uL — ABNORMAL LOW (ref 4.0–10.5)

## 2012-01-10 MED ORDER — ACETAMINOPHEN 325 MG PO TABS: 650.0000 mg | ORAL_TABLET | ORAL | Status: DC | PRN

## 2012-01-10 MED ORDER — ADULT MULTIVITAMIN W/MINERALS CH
1.0000 | ORAL_TABLET | Freq: Every day | ORAL | Status: DC
  Administered 2012-01-10 – 2012-01-11 (×2): 1 via ORAL
  Filled 2012-01-10 (×2): qty 1

## 2012-01-10 MED ORDER — LORAZEPAM 2 MG/ML IJ SOLN: 1.0000 mg | Freq: Four times a day (QID) | INTRAMUSCULAR | Status: DC | PRN

## 2012-01-10 MED ORDER — VITAMIN B-1 100 MG PO TABS
100.0000 mg | ORAL_TABLET | Freq: Every day | ORAL | Status: DC
  Administered 2012-01-10 – 2012-01-11 (×2): 100 mg via ORAL
  Filled 2012-01-10 (×2): qty 1

## 2012-01-10 MED ORDER — ZOLPIDEM TARTRATE 5 MG PO TABS: 5.0000 mg | ORAL_TABLET | Freq: Every evening | ORAL | Status: DC | PRN

## 2012-01-10 MED ORDER — FOLIC ACID 1 MG PO TABS
1.0000 mg | ORAL_TABLET | Freq: Every day | ORAL | Status: DC
  Administered 2012-01-10 – 2012-01-11 (×2): 1 mg via ORAL
  Filled 2012-01-10 (×2): qty 1

## 2012-01-10 MED ORDER — ONDANSETRON HCL 4 MG PO TABS: 4.0000 mg | ORAL_TABLET | Freq: Three times a day (TID) | ORAL | Status: DC | PRN

## 2012-01-10 MED ORDER — LORAZEPAM 1 MG PO TABS
1.0000 mg | ORAL_TABLET | Freq: Three times a day (TID) | ORAL | Status: DC | PRN
  Administered 2012-01-10: 1 mg via ORAL

## 2012-01-10 MED ORDER — NICOTINE 21 MG/24HR TD PT24
21.0000 mg | MEDICATED_PATCH | Freq: Every day | TRANSDERMAL | Status: DC
  Filled 2012-01-10: qty 1

## 2012-01-10 MED ORDER — THIAMINE HCL 100 MG/ML IJ SOLN: 100.0000 mg | Freq: Every day | INTRAMUSCULAR | Status: DC

## 2012-01-10 NOTE — BH Assessment (Addendum)
Assessment Note   [name] [name] is an 46 y.o. male. Pt reported to the [facility] under IVC from [facility]. Pt presents as depressed with a flat affect. Pt reports a hx of depression for the last 5 years, with symptoms increasing over the last 6 months. Pt's current depressive symptoms include: insomnia, isolating self from others, fatigue, loss of interest in usual pleasures, crying spells and feelings of anger. Pt reports that "lately I have been having crazy thoughts, I try to keep positive but sometimes I feel like I'm not strong enough". Pt attributes a large part of the increase in depression to his being homeless, stating he has tried to go to different agencies with no success.  Pt reports thoughts of suicide with a plan to "jump off a bridge". Pt denies all HI/AVH. Pt reports that he uses alcohol, cocaine and THC to self medicate for depression. Pt reports last using all substances on 01/09/12. Pt's longest period of sobriety of 3-4 months in 2009. Pt states that he was previously on Seroquel and Neurontin, though stopped taking these a "few months ago". Pt reports that he just "kept putting off going to the pharmacy".   Pt's information is being sent to [facility] for review.   Axis I: Major Depressive Disorder, Recurrent, Severe without Psychotic Features; Polysubstance Abuse Axis II: Deferred Axis III:  Past Medical History  Diagnosis Date  . Chest pain, mid sternal   . CAD in native artery     a. Nonobstructive cath 11/2007;  b. Presented with ST elevation - Nonobstructive cath 08/2011  . GERD (gastroesophageal reflux disease)   . Tobacco abuse   . Marijuana abuse     a. uses ~ 1x /wk or less  . History of cocaine abuse     a. quit ? 2009  . History of ETOH abuse     a. drinks 2 "40's" / wk  . Bradycardia     a. asymptomatic  . Bipolar disorder   . Syncope     a. 12/2010 - presumed to be vasovagal  . Abnormal ECG     a. early repolarization  . Coronary artery disease    Axis IV:  economic problems and housing problems Axis V: 31-40 impairment in reality testing  Past Medical History:  Past Medical History  Diagnosis Date  . Chest pain, mid sternal   . CAD in native artery     a. Nonobstructive cath 11/2007;  b. Presented with ST elevation - Nonobstructive cath 08/2011  . GERD (gastroesophageal reflux disease)   . Tobacco abuse   . Marijuana abuse     a. uses ~ 1x /wk or less  . History of cocaine abuse     a. quit ? 2009  . History of ETOH abuse     a. drinks 2 "40's" / wk  . Bradycardia     a. asymptomatic  . Bipolar disorder   . Syncope     a. 12/2010 - presumed to be vasovagal  . Abnormal ECG     a. early repolarization  . Coronary artery disease     History reviewed. No pertinent past surgical history.  Family History: No family history on file.  Social History:  reports that he has been smoking Cigarettes.  He has a 10 pack-year smoking history. He does not have any smokeless tobacco history on file. He reports that he drinks alcohol. He reports that he uses illicit drugs (Cocaine and Marijuana).  Additional Social History:  Substance #1 Name of Substance 1: Alcohol  1 - Age of First Use: 16 1 - Amount (size/oz): 3 40oz beers 1 - Frequency: daily 1 - Duration: 2 weeks 1 - Last Use / Amount: 01/09/12 amt unknown, states "it was consistently" Substance #2 Name of Substance 2: Crack Cocaine 2 - Age of First Use: 30s 2 - Amount (size/oz): $10 2 - Frequency: 1x weekly  2 - Duration: 15 yrs 2 - Last Use / Amount: 01/09/12- $10 Substance #3 Name of Substance 3: THC 3 - Age of First Use: 30s 3 - Amount (size/oz): 1 joint 3 - Frequency: 1-2x weekly 3 - Duration: 15 yrs  - last use 01/09/12 "1 joint"  CIWA: CIWA-Ar BP: 109/62 mmHg Pulse Rate: 62  Nausea and Vomiting: no nausea and no vomiting Tactile Disturbances: none Tremor: no tremor Auditory Disturbances: not present Paroxysmal Sweats: no sweat visible Visual Disturbances: not  present Anxiety: mildly anxious Headache, Fullness in Head: none present Agitation: normal activity Orientation and Clouding of Sensorium: oriented and can do serial additions CIWA-Ar Total: 1  COWS:    Allergies:  Allergies  Allergen Reactions  . Aspirin     Aggravates ulcer. "Causes chest pain."    Home Medications:  (Not in a hospital admission)  OB/GYN Status:  No LMP for male patient.  General Assessment Data Location of Assessment: [facility] ED Living Arrangements: Alone (homeless) Can pt return to current living arrangement?: Yes Admission Status: Involuntary Is patient capable of signing voluntary admission?: Yes Transfer from: Acute Hospital Referral Source: Self/Family/Friend  Education Status Is patient currently in school?: No Highest grade of school patient has completed: 10  Risk to self Suicidal Ideation: Yes-Currently Present Suicidal Intent: No-Not Currently/Within Last 6 Months Is patient at risk for suicide?: Yes Suicidal Plan?: Yes-Currently Present Specify Current Suicidal Plan: "jump off a bridge" Access to Means: Yes Specify Access to Suicidal Means: pt is able to walk to a bridge What has been your use of drugs/alcohol within the last 12 months?: Alcohol, Cocaine, THC Previous Attempts/Gestures: Yes How many times?: 2  (last attempt 12 years ago) Other Self Harm Risks: pt denies Triggers for Past Attempts: Other (Comment) (depression) Intentional Self Injurious Behavior: None Family Suicide History: No Recent stressful life event(s): Other (Comment) (homeless, economic issues) Persecutory voices/beliefs?: No Depression: Yes Depression Symptoms: Insomnia;Tearfulness;Isolating;Fatigue;Loss of interest in usual pleasures;Feeling worthless/self pity;Feeling angry/irritable Substance abuse history and/or treatment for substance abuse?: Yes Suicide prevention information given to non-admitted patients: Not applicable  Risk to Others Homicidal  Ideation: No Thoughts of Harm to Others: No Current Homicidal Intent: No Current Homicidal Plan: No Access to Homicidal Means: No Identified Victim: none History of harm to others?: No Assessment of Violence: None Noted Violent Behavior Description: pt is calm and cooperative Does patient have access to weapons?: No Criminal Charges Pending?: No Does patient have a court date: No  Psychosis Hallucinations: None noted Delusions: None noted  Mental Status Report Appear/Hygiene: Disheveled Eye Contact: Fair Motor Activity: Freedom of movement Speech: Logical/coherent Level of Consciousness: Alert Mood: Depressed Affect: Depressed Anxiety Level: None Thought Processes: Coherent;Relevant Judgement: Impaired Orientation: Person;Time;Place;Situation Obsessive Compulsive Thoughts/Behaviors: None  Cognitive Functioning Concentration: Normal Memory: Recent Intact;Remote Intact IQ: Average Insight: Fair Impulse Control: Fair Appetite: Poor Weight Loss:  (unkn but knows he has lost weight) Weight Gain: 0  Sleep: Decreased Total Hours of Sleep: 3  (3-4 hrs for the past few nights) Vegetative Symptoms: Staying in bed  ADLScreening [facility] Assessment Services) Patient's cognitive ability  adequate to safely complete daily activities?: Yes Patient able to express need for assistance with ADLs?: Yes Independently performs ADLs?: Yes  Abuse/Neglect [facility]) Physical Abuse: Denies Verbal Abuse: Denies Sexual Abuse: Denies  Prior Inpatient Therapy Prior Inpatient Therapy: Yes Prior Therapy Dates: 2x 2013 Prior Therapy Facilty/Provider(s): [facility] Reason for Treatment: depression, SI, and detox  Prior Outpatient Therapy Prior Outpatient Therapy: Yes Prior Therapy Dates: unkn Prior Therapy Facilty/Provider(s): [facility] Reason for Treatment: depression  ADL Screening (condition at time of admission) Patient's cognitive ability adequate to safely complete daily activities?:  Yes Patient able to express need for assistance with ADLs?: Yes Independently performs ADLs?: Yes       Abuse/Neglect Assessment (Assessment to be complete while patient is alone) Physical Abuse: Denies Verbal Abuse: Denies Sexual Abuse: Denies Values / Beliefs Cultural Requests During Hospitalization: None Spiritual Requests During Hospitalization: None        Additional Information 1:1 In Past 12 Months?: No CIRT Risk: No Elopement Risk: No Does patient have medical clearance?: Yes     Disposition:  Disposition Disposition of Patient: Referred to [facility]) Patient referred to:  [facility])  On Site Evaluation by:   Reviewed with Physician:     [place] Crane F 01/10/2012 6:20 PM

## 2012-01-10 NOTE — ED Notes (Signed)
Pt reports he is here for depression, "thoughts running through my head about harming myself." Reports plan to jump off bridge. Responds "yes and no" to questioning about HI. Reports with everything going on he is short-tempered and little things set him off and in that moment when he is mad he wants to harm others, but currently, when he is not mad, denies this.

## 2012-01-10 NOTE — ED Notes (Signed)
Sitter at bedside.

## 2012-01-10 NOTE — ED Notes (Signed)
Pt in by [facility], IVC by [facility]. Per papers, SI with plan to jump off bridge.

## 2012-01-10 NOTE — ED Provider Notes (Signed)
Medical screening examination/treatment/procedure(s) were performed by non-physician practitioner and as supervising physician I was immediately available for consultation/collaboration.  Shelda Jakes, MD 01/10/12 [phone]

## 2012-01-10 NOTE — ED Provider Notes (Signed)
History     CSN: [id number]  Arrival date & time 01/10/12  1124   First MD Initiated Contact with Patient 01/10/12 1332      Chief Complaint  Patient presents with  . Medical Clearance  . Suicidal    (Consider location/radiation/quality/duration/timing/severity/associated sxs/prior treatment) HPI  46 year old male with history of bipolar, history of alcohol abuse, history of cocaine abuse presents complaining of suicidal ideation.she reports he is tired of living his life. States he has a lot of depression especially being homeless. he has thought about jumping off the bridge to end his life. He admits to using cocaine, weed, and beer to self medicate.  Last use was last night. Patient requests for further management as he is afraid of harming himself. he denies homicidal ideation.  Denies visual auditory hallucination. Denies any pain.  Past Medical History  Diagnosis Date  . Chest pain, mid sternal   . CAD in native artery     a. Nonobstructive cath 11/2007;  b. Presented with ST elevation - Nonobstructive cath 08/2011  . GERD (gastroesophageal reflux disease)   . Tobacco abuse   . Marijuana abuse     a. uses ~ 1x /wk or less  . History of cocaine abuse     a. quit ? 2009  . History of ETOH abuse     a. drinks 2 "40's" / wk  . Bradycardia     a. asymptomatic  . Bipolar disorder   . Syncope     a. 12/2010 - presumed to be vasovagal  . Abnormal ECG     a. early repolarization  . Coronary artery disease     History reviewed. No pertinent past surgical history.  No family history on file.  History  Substance Use Topics  . Smoking status: Current Everyday Smoker -- 0.5 packs/day for 20 years    Types: Cigarettes  . Smokeless tobacco: Not on file   Comment: Smokes about 3 cigarettes/day  . Alcohol Use: Yes     4-5 40oz/day      Review of Systems  Constitutional: Negative for fever and chills.  HENT: Negative for neck pain.   Respiratory: Negative for shortness of  breath.   Cardiovascular: Negative for chest pain.  Gastrointestinal: Negative for abdominal pain.  Skin: Negative for rash.  Neurological: Negative for headaches.  Psychiatric/Behavioral: Positive for suicidal ideas and self-injury. Negative for hallucinations.  All other systems reviewed and are negative.    Allergies  Aspirin  Home Medications   Current Outpatient Rx  Name Route Sig Dispense Refill  . ASPIRIN EC 81 MG PO TBEC Oral Take 81 mg by mouth daily.      BP 113/66  Pulse 65  Temp 98.2 F (36.8 C) (Oral)  Resp 16  Ht 5\' 4"  (1.626 m)  Wt 116 lb (52.617 kg)  BMI 19.91 kg/m2  SpO2 96%  Physical Exam  Nursing note and vitals reviewed. Constitutional: He appears well-developed and well-nourished. No distress.       Awake, alert, nontoxic appearance  HENT:  Head: Atraumatic.  Eyes: Conjunctivae are normal. Right eye exhibits no discharge. Left eye exhibits no discharge.  Neck: Normal range of motion. Neck supple.  Cardiovascular: Normal rate and regular rhythm.   Pulmonary/Chest: Effort normal. No respiratory distress. He exhibits no tenderness.  Abdominal: Soft. There is no tenderness. There is no rebound.  Musculoskeletal: He exhibits no tenderness.       ROM appears intact, no obvious focal weakness  Neurological: He  is alert.  Skin: Skin is warm and dry. No rash noted.  Psychiatric: He has a normal mood and affect. His speech is normal and behavior is normal. He expresses suicidal ideation. He expresses no homicidal ideation.    ED Course  Procedures (including critical care time)  Labs Reviewed  CBC - Abnormal; Notable for the following:    WBC 3.9 (*)     All other components within normal limits  COMPREHENSIVE METABOLIC PANEL - Abnormal; Notable for the following:    GFR calc non Af Amer 75 (*)     GFR calc Af Amer 87 (*)     All other components within normal limits  ETHANOL - Abnormal; Notable for the following:    Alcohol, Ethyl (B) 183 (*)       All other components within normal limits  URINE RAPID DRUG SCREEN (HOSP PERFORMED)   Results for orders placed during the hospital encounter of 01/10/12  CBC      Component Value Range   WBC 3.9 (*) 4.0 - 10.5 K/uL   RBC 4.62  4.22 - 5.81 MIL/uL   Hemoglobin 14.5  13.0 - 17.0 g/dL   HCT 40.9  81.1 - 91.4 %   MCV 88.3  78.0 - 100.0 fL   MCH 31.4  26.0 - 34.0 pg   MCHC 35.5  30.0 - 36.0 g/dL   RDW 78.2  95.6 - 21.3 %   Platelets 201  150 - 400 K/uL  COMPREHENSIVE METABOLIC PANEL      Component Value Range   Sodium 140  135 - 145 mEq/L   Potassium 4.0  3.5 - 5.1 mEq/L   Chloride 105  96 - 112 mEq/L   CO2 23  19 - 32 mEq/L   Glucose, Bld 87  70 - 99 mg/dL   BUN 10  6 - 23 mg/dL   Creatinine, Ser 0.86  0.50 - 1.35 mg/dL   Calcium 8.9  8.4 - 57.8 mg/dL   Total Protein 7.4  6.0 - 8.3 g/dL   Albumin 3.6  3.5 - 5.2 g/dL   AST 30  0 - 37 U/L   ALT 15  0 - 53 U/L   Alkaline Phosphatase 89  39 - 117 U/L   Total Bilirubin 0.3  0.3 - 1.2 mg/dL   GFR calc non Af Amer 75 (*) >90 mL/min   GFR calc Af Amer 87 (*) >90 mL/min  ETHANOL      Component Value Range   Alcohol, Ethyl (B) 183 (*) 0 - 11 mg/dL   Dg Chest Portable 1 View  12/13/2011  *RADIOLOGY REPORT*  Clinical Data: Left-sided chest pain, shortness of breath for several days  PORTABLE CHEST - 1 VIEW  Comparison: Chest x-ray of 09/19/2011  Findings: The lungs are clear.  No infiltrate or effusion is seen. The heart is within upper limits of normal.  No bony abnormality is seen.  IMPRESSION: No active lung disease.  Original Report Authenticated By: Juline Patch, M.D.   1.  Suicidal ideation 2. Alcohol abuse   MDM  Requesting help for his depression and suicidal ideation. Not actively suicidal. No homicidal ideation. Admits to using recreational drugs and alcohol. His alcohol level is 183 over he is mentating appropriately and appears to be clinically sober.  Medical clearance lab initiated.    2:15 PM Pt is medically  cleared.  Psych holding order placed.  Discussed with my attending.   3:03 PM I have consulted with  ACT team, who agrees to continue pt care for his depression and SI.   Fayrene Helper, PA-C 01/10/12 1504  4:05 PM Pt placed on CIWA protocol for his hx of alcohol abuse  Fayrene Helper, PA-C 01/10/12 1605

## 2012-01-10 NOTE — ED Notes (Signed)
Pt aware of the need for a urine sample however unable to void at this time. 

## 2012-01-10 NOTE — ED Notes (Signed)
Social worker in with pt.

## 2012-01-11 ENCOUNTER — Encounter (HOSPITAL_COMMUNITY): Payer: Self-pay | Admitting: Emergency Medicine

## 2012-01-11 ENCOUNTER — Inpatient Hospital Stay (HOSPITAL_COMMUNITY)
Admission: AD | Admit: 2012-01-11 | Discharge: 2012-01-19 | DRG: 897 | Disposition: A | Discharge status: Home / Self Care | Payer: Medicaid Other | Attending: Psychiatry | Admitting: Psychiatry

## 2012-01-11 DIAGNOSIS — F10939 Alcohol use, unspecified with withdrawal, unspecified: Principal | ICD-10-CM | POA: Diagnosis not present

## 2012-01-11 DIAGNOSIS — F1210 Cannabis abuse, uncomplicated: Secondary | ICD-10-CM

## 2012-01-11 DIAGNOSIS — R0789 Other chest pain: Secondary | ICD-10-CM

## 2012-01-11 DIAGNOSIS — R9431 Abnormal electrocardiogram [ECG] [EKG]: Secondary | ICD-10-CM

## 2012-01-11 DIAGNOSIS — R001 Bradycardia, unspecified: Secondary | ICD-10-CM

## 2012-01-11 DIAGNOSIS — F1410 Cocaine abuse, uncomplicated: Secondary | ICD-10-CM

## 2012-01-11 DIAGNOSIS — F1994 Other psychoactive substance use, unspecified with psychoactive substance-induced mood disorder: Secondary | ICD-10-CM

## 2012-01-11 DIAGNOSIS — F1020 Alcohol dependence, uncomplicated: Secondary | ICD-10-CM

## 2012-01-11 DIAGNOSIS — F1411 Cocaine abuse, in remission: Secondary | ICD-10-CM

## 2012-01-11 DIAGNOSIS — F1011 Alcohol abuse, in remission: Secondary | ICD-10-CM

## 2012-01-11 DIAGNOSIS — F10239 Alcohol dependence with withdrawal, unspecified: Principal | ICD-10-CM | POA: Diagnosis not present

## 2012-01-11 DIAGNOSIS — Z720 Tobacco use: Secondary | ICD-10-CM

## 2012-01-11 DIAGNOSIS — K219 Gastro-esophageal reflux disease without esophagitis: Secondary | ICD-10-CM

## 2012-01-11 DIAGNOSIS — G4700 Insomnia, unspecified: Secondary | ICD-10-CM | POA: Diagnosis present

## 2012-01-11 DIAGNOSIS — F10988 Alcohol use, unspecified with other alcohol-induced disorder: Secondary | ICD-10-CM | POA: Diagnosis present

## 2012-01-11 DIAGNOSIS — I2510 Atherosclerotic heart disease of native coronary artery without angina pectoris: Secondary | ICD-10-CM

## 2012-01-11 DIAGNOSIS — R55 Syncope and collapse: Secondary | ICD-10-CM

## 2012-01-11 DIAGNOSIS — F17200 Nicotine dependence, unspecified, uncomplicated: Secondary | ICD-10-CM | POA: Diagnosis present

## 2012-01-11 MED ORDER — VITAMIN B-1 100 MG PO TABS
100.0000 mg | ORAL_TABLET | Freq: Every day | ORAL | Status: DC
  Administered 2012-01-12 – 2012-01-18 (×7): 100 mg via ORAL
  Filled 2012-01-11 (×9): qty 1

## 2012-01-11 MED ORDER — QUETIAPINE FUMARATE ER 50 MG PO TB24
150.0000 mg | ORAL_TABLET | Freq: Every day | ORAL | Status: DC
  Administered 2012-01-11 – 2012-01-13 (×3): 150 mg via ORAL
  Filled 2012-01-11 (×5): qty 3

## 2012-01-11 MED ORDER — MAGNESIUM HYDROXIDE 400 MG/5ML PO SUSP: 30.0000 mL | Freq: Every day | ORAL | Status: DC | PRN

## 2012-01-11 MED ORDER — NICOTINE 21 MG/24HR TD PT24
21.0000 mg | MEDICATED_PATCH | Freq: Every day | TRANSDERMAL | Status: DC
  Administered 2012-01-12 – 2012-01-18 (×7): 21 mg via TRANSDERMAL
  Filled 2012-01-11 (×12): qty 1

## 2012-01-11 MED ORDER — CHLORDIAZEPOXIDE HCL 25 MG PO CAPS
25.0000 mg | ORAL_CAPSULE | Freq: Three times a day (TID) | ORAL | Status: AC
  Administered 2012-01-13 (×3): 25 mg via ORAL
  Filled 2012-01-11 (×3): qty 1

## 2012-01-11 MED ORDER — CHLORDIAZEPOXIDE HCL 25 MG PO CAPS: 25.0000 mg | ORAL_CAPSULE | Freq: Four times a day (QID) | ORAL | Status: AC | PRN

## 2012-01-11 MED ORDER — ACETAMINOPHEN 325 MG PO TABS: 650.0000 mg | ORAL_TABLET | Freq: Four times a day (QID) | ORAL | Status: DC | PRN

## 2012-01-11 MED ORDER — ADULT MULTIVITAMIN W/MINERALS CH
1.0000 | ORAL_TABLET | Freq: Every day | ORAL | Status: DC
  Administered 2012-01-11 – 2012-01-18 (×8): 1 via ORAL
  Filled 2012-01-11 (×11): qty 1

## 2012-01-11 MED ORDER — CHLORDIAZEPOXIDE HCL 25 MG PO CAPS
25.0000 mg | ORAL_CAPSULE | ORAL | Status: AC
  Administered 2012-01-14: 25 mg via ORAL
  Filled 2012-01-11 (×3): qty 1

## 2012-01-11 MED ORDER — CHLORDIAZEPOXIDE HCL 25 MG PO CAPS
25.0000 mg | ORAL_CAPSULE | Freq: Every day | ORAL | Status: AC
  Administered 2012-01-15: 25 mg via ORAL

## 2012-01-11 MED ORDER — TRAZODONE HCL 50 MG PO TABS
50.0000 mg | ORAL_TABLET | Freq: Every evening | ORAL | Status: DC | PRN
  Administered 2012-01-11 – 2012-01-12 (×3): 50 mg via ORAL
  Filled 2012-01-11 (×7): qty 1

## 2012-01-11 MED ORDER — HYDROXYZINE HCL 25 MG PO TABS: 25.0000 mg | ORAL_TABLET | Freq: Four times a day (QID) | ORAL | Status: AC | PRN

## 2012-01-11 MED ORDER — ALUM & MAG HYDROXIDE-SIMETH 200-200-20 MG/5ML PO SUSP
30.0000 mL | ORAL | Status: DC | PRN
  Administered 2012-01-13 – 2012-01-17 (×5): 30 mL via ORAL

## 2012-01-11 MED ORDER — LOPERAMIDE HCL 2 MG PO CAPS: 2.0000 mg | ORAL_CAPSULE | ORAL | Status: AC | PRN

## 2012-01-11 MED ORDER — ONDANSETRON 4 MG PO TBDP
4.0000 mg | ORAL_TABLET | Freq: Four times a day (QID) | ORAL | Status: AC | PRN
  Administered 2012-01-12 – 2012-01-14 (×2): 4 mg via ORAL
  Filled 2012-01-11 (×2): qty 1

## 2012-01-11 MED ORDER — CHLORDIAZEPOXIDE HCL 25 MG PO CAPS
25.0000 mg | ORAL_CAPSULE | Freq: Four times a day (QID) | ORAL | Status: AC
  Administered 2012-01-11 – 2012-01-12 (×4): 25 mg via ORAL
  Filled 2012-01-11 (×4): qty 1

## 2012-01-11 MED ORDER — GABAPENTIN 300 MG PO CAPS
300.0000 mg | ORAL_CAPSULE | Freq: Three times a day (TID) | ORAL | Status: DC
  Administered 2012-01-12 – 2012-01-18 (×19): 300 mg via ORAL
  Filled 2012-01-11 (×16): qty 1
  Filled 2012-01-11: qty 42
  Filled 2012-01-11 (×4): qty 1
  Filled 2012-01-11 (×2): qty 42
  Filled 2012-01-11 (×3): qty 1

## 2012-01-11 MED ORDER — THIAMINE HCL 100 MG/ML IJ SOLN
100.0000 mg | Freq: Once | INTRAMUSCULAR | Status: AC
  Administered 2012-01-11: 100 mg via INTRAMUSCULAR

## 2012-01-11 NOTE — ED Provider Notes (Signed)
He has been accepted at [facility] by Dr. Ferol Luz.  Dione Booze, MD 01/11/12 [phone]

## 2012-01-11 NOTE — Tx Team (Signed)
Initial Interdisciplinary Treatment Plan  PATIENT STRENGTHS: (choose at least two) Ability for insight Average or above average intelligence Capable of independent living Communication skills Motivation for treatment/growth  PATIENT STRESSORS: Financial difficulties Substance abuse   PROBLEM LIST: Problem List/Patient Goals Date to be addressed Date deferred Reason deferred Estimated date of resolution  Depression 01/11/12     Alcohol Dependence 01/11/12     Suicide Risk 01/11/12                                          DISCHARGE CRITERIA:  Ability to meet basic life and health needs Adequate post-discharge living arrangements Improved stabilization in mood, thinking, and/or behavior Motivation to continue treatment in a less acute level of care Reduction of life-threatening or endangering symptoms to within safe limits Verbal commitment to aftercare and medication compliance Withdrawal symptoms are absent or subacute and managed without 24-hour nursing intervention  PRELIMINARY DISCHARGE PLAN: Attend aftercare/continuing care group Outpatient therapy Placement in alternative living arrangements  PATIENT/FAMIILY INVOLVEMENT: This treatment plan has been presented to and reviewed with the patient, [name] [name], and/or family member, .  The patient and family have been given the opportunity to ask questions and make suggestions.  [name] 01/11/2012, 10:44 PM
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
## 2012-01-11 NOTE — BHH Counselor (Signed)
Referred to [facility] by Dr. Ferol Luz to [facility]. Room 404-1.

## 2012-01-12 DIAGNOSIS — F1410 Cocaine abuse, uncomplicated: Secondary | ICD-10-CM | POA: Diagnosis present

## 2012-01-12 DIAGNOSIS — F1994 Other psychoactive substance use, unspecified with psychoactive substance-induced mood disorder: Secondary | ICD-10-CM | POA: Diagnosis present

## 2012-01-12 DIAGNOSIS — F1210 Cannabis abuse, uncomplicated: Secondary | ICD-10-CM | POA: Diagnosis present

## 2012-01-12 MED ORDER — ASPIRIN EC 81 MG PO TBEC: 81.0000 mg | DELAYED_RELEASE_TABLET | Freq: Every day | ORAL | Status: DC

## 2012-01-12 NOTE — Progress Notes (Signed)
Psychoeducational Group Note  Date:  01/12/2012 Time:  1100  Group Topic/Focus:  Personal Choices and Values:   The focus of this group is to help patients assess and explore the importance of values in their lives, how their values affect their decisions, how they express their values and what opposes their expression.  Participation Level: Did Not Attend  Participation Quality:  Not Applicable  Affect:  Not Applicable  Cognitive:  Not Applicable  Insight:  Not Applicable  Engagement in Group: Not Applicable  Additional Comments:  Pt did not attend group but remained in bed.   Sharyn Lull 01/12/2012, 11:44 AM

## 2012-01-12 NOTE — Progress Notes (Signed)
BHH Group Notes:  (Counselor/Nursing/MHT/Case Management/Adjunct)  01/12/2012 10:13 PM  Type of Therapy:  NA Meeting  Participation Level:  Did Not Attend  Participation Quality:  did not attend  Affect:  Appropriate  Cognitive:  Appropriate  Insight:  did not attend  Engagement in Group:  None  Engagement in Therapy:  None  Modes of Intervention:  Support  Summary of Progress/Problems:   Nichola Sizer 01/12/2012, 10:13 PMThe focus of this group is to help patients review their daily goal of treatment and discuss progress on daily workbooks.

## 2012-01-12 NOTE — Progress Notes (Signed)
Adult Psychosocial Assessment Update Interdisciplinary Team  [facility] admissions[facility]discharges:  Admissions Discharges  Date: 10/13/11 Date: 10/18/11  Date: Date:  Date: Date:  Date: Date:  Date: Date:   Changes since the last Psychosocial Assessment (including adherence to outpatient mental health and/or substance abuse treatment, situational issues contributing to decompensation and/or relapse).   Patient reports upon discharge in May 2013 from [facility] he returned to [facility] to    Live although he reports he cannot return there now.  Kaelon did not followup at [facility]   as scheduled nor did he take medications he was discharged on.  "I took prescription to   pharmacy but never picked it up."  Patient reports he has been using Ethol, 3+ 40 oz    beers daily, Cocaine $10 bag 1+ weekly, and THC 2+ joints weekly. Patient did not    attend AA nor NA meetings as planned.    Discharge Plan 1. Will you be returning to the same living situation after discharge?   Yes: No:   X   If no, what is your plan?      Patient reports he cannot return to [facility] and wishes to enter inpatient treatment        2. Would you like a referral for services when you are discharged? Yes:  X   If yes, for what services?  No:        Inpatient treatment for Polysubstance Abuse       Summary and Recommendations (to be completed by the evaluator)   Patient is 46 YO unemployed single [place] American Male who receives SSI and food     Stamps.  Patient reports he did not followup with [facility] nor AA/NA or medications    Following 10/20/11 discharge.  Patient admitted with diagnosis of Major Depressive    Disorder, Recurrent Severe wout Psychotic Features & Polysubstance Abuse. Patient    will benefit from crisis stabilization, medication evaluation, group therapy and psycho ed      groups, in addition to case management for discharge planning.              Signature:  [name], 01/12/2012 9:24 AM
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
## 2012-01-12 NOTE — Progress Notes (Signed)
BHH Group Notes:  (Counselor/Nursing/MHT/Case Management/Adjunct)  01/12/2012 4:22 PM  Type of Therapy:  Group Therapy  Participation Level:  Minimal  Participation Quality:  Drowsy and Inattentive  Affect:  Depressed and Flat  Cognitive:  Oriented  Insight:  Limited  Engagement in Group:  Limited  Engagement in Therapy:  Limited  Modes of Intervention:  Clarification, Limit-setting, Socialization and Support  Summary of Progress/Problems: The focus of this group session was to process how we deal with difficult emotions and share with others the patterns that play out when we are reacting to the emotion verses the situation.  [name] [name] shared that one of the most difficult emotions he deals with is "anxiety when others laugh at me or judge me.  No one knows what anyone else has to deal with, you know."  [name] [name] also shared he feels better when he can help other people; and money in his pocket leads to drinking and using.  Others in group offered suggestions regarding cash on hand.     [name] 01/12/2012, 4:22 PM
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
## 2012-01-12 NOTE — Progress Notes (Signed)
Patient recently discharged from [facility] (10/20/11) reports he is willing for [place] to provide Suicide Prevention Education to his sister although he cannot recall phone number(s).  Patient will try to access. [name] 01/12/2012 9:32 AM

## 2012-01-12 NOTE — Progress Notes (Signed)
D: Patient denies SI/HI and A/V hallucinations; reported that his sleep was poor because he constantly tossed and turned and was only able to get 3-4 hours of sleep; reports his appetite is good and his energy level is low; reports his ability to pay attention is poor; rates his depression and hopelessness as 5/10 and anxiety 2/10; only reports  Chills and agitation; no other complaints at this time A: patient has been offered emotional support; and encouraged to express feelings and/or concerns; librium protocol; medication administration/physician orders; encouraged to attend groups R: Patient is cooperative and patient is guarded but pleasant; patient is attending groups; patient is taking medications as prescribed and tolerating medications

## 2012-01-12 NOTE — Progress Notes (Signed)
Patient ID: [name] [name], male   DOB: March 27, 1966, 46 y.o.   MRN: [id number]  Problem:  Alcohol Abuse, Bipolar  D: Pt with dull, flat affect endorsing depression. Pt pleasant and cooperative.  A: Monitor patient Q 15 minutes for safety, encourage staff/peer interaction and group participation. Administer medications as ordered by MD.  R: Pt compliant with HS medications, but declined to attend group session. Pt currently denies SI or plans to harm himself. No inappropriate behaviors noted.

## 2012-01-13 DIAGNOSIS — F1020 Alcohol dependence, uncomplicated: Secondary | ICD-10-CM

## 2012-01-13 DIAGNOSIS — F1410 Cocaine abuse, uncomplicated: Secondary | ICD-10-CM

## 2012-01-13 DIAGNOSIS — F1994 Other psychoactive substance use, unspecified with psychoactive substance-induced mood disorder: Secondary | ICD-10-CM

## 2012-01-13 DIAGNOSIS — F1210 Cannabis abuse, uncomplicated: Secondary | ICD-10-CM

## 2012-01-13 MED ORDER — TRAZODONE HCL 100 MG PO TABS
100.0000 mg | ORAL_TABLET | Freq: Every day | ORAL | Status: DC
  Administered 2012-01-13 – 2012-01-15 (×3): 100 mg via ORAL
  Filled 2012-01-13 (×6): qty 1

## 2012-01-13 NOTE — Progress Notes (Signed)
D: Patient denies SI/HI and A/V hallucinations; patient reported that his sleep is fair because he could not stay asleep all night  And wake up in cold sweats ; appetite is improving and energy level is low; ability to pay attention is improving; rated her depression as 7/10 and hopelessness as 6/10; has some complaints of chills at night; blood pressure in the low 91/54  A: patient has been offered emotional support; and encouraged to express feelings and/or concerns; librium protocol; medication administration/physician orders; encouraged to attend groups; patient encouraged to drink fluids and educated about orthostatic hypotension and blood pressure brought up 10 110/71 R: Patient is cooperative and pleasant; patient is attending groups; patient is taking medications as prescribed and tolerating medications; pain decreased per patient

## 2012-01-13 NOTE — Progress Notes (Signed)
D: Patient resting in bed on right side with eyes closed.  Respirations even and unlabored. In no apparent distress.  A:  Staff to monitor Q 15 mins for safety R: Patient remains safe on the unit.

## 2012-01-13 NOTE — Discharge Planning (Signed)
Pt was seen this morning for discharge planning group.  Pt stated that he is ready for change and seeking long term residential treatment.  He stated that admitted to the hospital voluntarily because he was tired of his current lifestyle of drinking and using drugs.  Prior to be admitted to the hospital he was living in a hotel room because he could not get an apartment due to being unable to pay an outstanding electricity bill.  He rated his depression level at a five and his anxiety level at a five also.  He stated that he does not have a good support system.  Pt denies SI/HI/AVH.

## 2012-01-13 NOTE — Progress Notes (Signed)
.  01/13/2012         Time: 1500      Group Topic/Focus: The focus of this group is on enhancing the patient's understanding of leisure, barriers to leisure, and the importance of engaging in positive leisure activities upon discharge for improved total health.  Participation Level: Did not attend  Participation Quality: Not Applicable  Affect: Not Applicable  Cognitive: Not Applicable   Additional Comments: None   [name] [name] 01/13/2012 3:55 PM

## 2012-01-13 NOTE — Progress Notes (Signed)
[facility] Group Notes:  (Counselor/Nursing/MHT/Case Management/Adjunct)  01/13/2012 3:40 PM  Type of Therapy:  Group Therapy  Participation Level:  Did Not Attend  [name] 01/13/2012, 3:40 PM

## 2012-01-13 NOTE — Progress Notes (Signed)
Psychoeducational Group Note  Date:  01/13/2012 Time:  1100  Group Topic/Focus:  Overcoming Stress:   The focus of this group is to define stress and help patients assess their triggers.  Participation Level:  Active  Participation Quality:  Appropriate, Attentive and Sharing  Affect:  Appropriate  Cognitive:  Alert and Appropriate  Insight:  Good  Engagement in Group:  Good  Additional Comments:  Pt was very appropriate and sharing while attending group. Pt stated that his children is a major stressor in his life. Pt also stated that he stressing him out to know that he can not provide for his new grandchild that his son just had.   Sharyn Lull 01/13/2012, 2:07 PM

## 2012-01-13 NOTE — Tx Team (Signed)
Interdisciplinary Treatment Plan Update (Adult)  Date:  01/13/2012  Time Reviewed:  10:07 AM   Progress in Treatment: Attending groups: Yes Participating in groups:  Yes Taking medication as prescribed: Yes Tolerating medication:  Yes Family/Significant othe contact made:  Counselor assessing for appropriate contact Patient understands diagnosis:  Yes Discussing patient identified problems/goals with staff:  Yes Medical problems stabilized or resolved:  Yes Denies suicidal/homicidal ideation: Yes Issues/concerns per patient self-inventory:  None identified Other: N/A  New problem(s) identified: None Identified  Reason for Continuation of Hospitalization: Anxiety Depression Medication stabilization  Interventions implemented related to continuation of hospitalization: mood stabilization, medication monitoring and adjustment, group therapy and psycho education, safety checks q 15 mins  Additional comments: N/A  Estimated length of stay: 3-5 days  Discharge Plan: Pt requesting long term treatment, SW will refer pt to appropriate referrals.    New goal(s): N/A  Review of initial/current patient goals per problem list:    1.  Goal(s): Address substance use  Met:  No  Target date: by discharge  As evidenced by: completing detox protocol and refer to appropriate treatment  2.  Goal (s): Reduce depressive and anxiety symptoms  Met:  No  Target date: by discharge  As evidenced by: Reducing depression from a 10 to a 3 as reported by pt.    3.  Goal(s): Eliminate SI  Met:  No  Target date: by discharge  As evidenced by: Pt denies SI   Attendees: Patient:  [name] [name] 01/13/2012 10:07 AM   Family:     Physician:  Lupe Carney, DO 01/13/2012 10:07 AM   Nursing: Quintella Reichert, RN 01/13/2012 10:07 AM   Case Manager:  Reyes Ivan, LCSWA 01/13/2012  10:07 AM   Counselor:  Ronda Fairly, LCSWA 01/13/2012  10:07 AM   Other:  Nestor Ramp, RN 01/13/2012 10:07 AM     Other:  Carney Living, RN 01/13/2012 10:07 AM   Other:  Demeitrus Watson, LCSWA 01/13/2012 10:07 AM   Other:      Scribe for Treatment Team:   Reyes Ivan 01/13/2012 10:07 AM

## 2012-01-13 NOTE — Progress Notes (Addendum)
D: Patient calm and cooperative.  Does not initiate conversation and has minimal interaction with peers and staff.  Patient states he is doing ok. Patient smiles when Clinical research associate talks to him.  Denies SI/HI and AVH A: Staff to monitor Q 15 mins for safety. Encouragement and support offered. Patient given gatorade because he was told to increased fluid intake on previous shift for hypotension.  Scheduled medications administered per orders.  R: Patient remains safe on the unit. Taking administered medications. Patient pleasant and cooperative.  Patient attended karaoke group tonight. Evening B/P 107/69 sitting and 108/65 standing.

## 2012-01-13 NOTE — Progress Notes (Signed)
[facility] MD Progress Note  01/13/2012 1:14 PM  S/O: Patient seen and evaluated. Chart reviewed. Patient stated that his mood was "ok". His affect was mood congruent and euthymic. He denied any current thoughts of self injurious behavior, suicidal ideation or homicidal ideation. There were no auditory or visual hallucinations, paranoia, delusional thought processes, or mania noted.  Thought process was linear and goal directed.  No psychomotor agitation or retardation was noted. His speech was normal rate, tone and volume. Eye contact was good. Judgment and insight are fair.  Patient has been up and engaged on the unit.  No acute safety concerns reported from team.  Sleep:  Number of Hours: 6.75    Vital Signs:Blood pressure 91/54, pulse 80, temperature 97.8 F (36.6 C), temperature source Oral, resp. rate 18, height 5\' 5"  (1.651 m), weight 50.803 kg (112 lb).  Current Medications:    . chlordiazePOXIDE  25 mg Oral QID   Followed by  . chlordiazePOXIDE  25 mg Oral TID   Followed by  . chlordiazePOXIDE  25 mg Oral BH-qamhs   Followed by  . chlordiazePOXIDE  25 mg Oral Daily  . gabapentin  300 mg Oral TID  . multivitamin with minerals  1 tablet Oral Daily  . nicotine  21 mg Transdermal Q0600  . QUEtiapine  150 mg Oral Q2000  . thiamine  100 mg Oral Daily  . traZODone  50 mg Oral QHS,MR X 1  . DISCONTD: aspirin EC  81 mg Oral Daily    Lab Results: No results found for this or any previous visit (from the past 48 hour(s)).  A/P: Alcohol Dependence & W/D; Cocaine Abuse; Cannabis Abuse; Substance Induced Mood Disorder; Insomnia  Pt seen and evaluated in treatment team.  Reviewed short term and long term goals, medications, current treatment in the hospital and acute/chronic safety.  Pt denied any current thoughts of self harm, suicidal ideation or homicidal ideation.  Contracted for safety on the unit.  No acute issues noted.  VS reviewed with team.  Pt agreeable with treatment plan, see orders.  Pt accepted to go to [facility] next Weds. Trazodone changed to 100 mg qhs for continued insomnia.  Lupe Carney 01/13/2012, 1:14 PM

## 2012-01-14 NOTE — Progress Notes (Signed)
[facility] Adult Inpatient Family/Significant Other Suicide Prevention Education  Suicide Prevention Education:  Education Completed; Patient's sister [name] [name] at [phone] has been  identified as the person(s) who will aid the patient in the event of a mental health crisis (suicidal ideations/suicide attempt).  With written consent from the patient, the family member/significant other has been provided the following suicide prevention education, prior to the and/or following the discharge of the patient.  The suicide prevention education provided includes the following:  Suicide risk factors  Suicide prevention and interventions  National Suicide Hotline telephone number  [facility] assessment telephone number  [facility] Emergency Assistance 911  [facility] and/or Residential Mobile Crisis Unit telephone number  Request made of family/significant other to:  Remove weapons (e.g., guns, rifles, knives), all items previously/currently identified as safety concern.    Remove drugs/medications (over-the-counter, prescriptions, illicit drugs), all items previously/currently identified as a safety concern.  [name] [name] states patient is currently homeless, has no access to firearms and often speaks about "killing himself"  The family member/significant other verbalizes understanding of the suicide prevention education information provided.  The family member/significant other agrees to remove the items of safety concern listed above.  [name] [name] 01/14/2012, 10:15 AM

## 2012-01-14 NOTE — Progress Notes (Signed)
[facility] Group Notes:  (Counselor/Nursing/MHT/Case Management/Adjunct)  01/14/2012 8:43 PM  Type of Therapy:  Did Not Attend  Participation Level:  Did Not Attend  Participation Quality:  Did Not Attend  Affect:  Appropriate  Cognitive:  Appropriate  Insight:  Did Not Attend  Engagement in Group:  Did Not Attend  Engagement in Therapy:  Did Not Attend  Modes of Intervention:  Did Not Attend  Summary of Progress/Problems:   Meredith Staggers 01/14/2012, 8:43 PM

## 2012-01-14 NOTE — Progress Notes (Signed)
[facility] MD Progress Note  01/14/2012 11:49 AM  S/O: Patient seen and evaluated. Chart reviewed. Patient stated that his mood was "ok".  Sedation reported with Seroquel and pt asked for it to be discontinued. His affect was mood congruent and euthymic. He denied any current thoughts of self injurious behavior, suicidal ideation or homicidal ideation. There were no auditory or visual hallucinations, paranoia, delusional thought processes, or mania noted.  Thought process was linear and goal directed.  No psychomotor agitation or retardation was noted. His speech was normal rate, tone and volume. Eye contact was good. Judgment and insight are fair.  Patient has been up and engaged on the unit.  No acute safety concerns reported from team.  Sleep:  Number of Hours: 6.75    Vital Signs:Blood pressure 94/59, pulse 86, temperature 97.9 F (36.6 C), temperature source Oral, resp. rate 16, height 5\' 5"  (1.651 m), weight 50.803 kg (112 lb).  Current Medications:     . chlordiazePOXIDE  25 mg Oral TID   Followed by  . chlordiazePOXIDE  25 mg Oral BH-qamhs   Followed by  . chlordiazePOXIDE  25 mg Oral Daily  . gabapentin  300 mg Oral TID  . multivitamin with minerals  1 tablet Oral Daily  . nicotine  21 mg Transdermal Q0600  . QUEtiapine  150 mg Oral Q2000  . thiamine  100 mg Oral Daily  . traZODone  100 mg Oral QHS  . DISCONTD: traZODone  50 mg Oral QHS,MR X 1    Lab Results: No results found for this or any previous visit (from the past 48 hour(s)).  A/P: Alcohol Dependence & W/D; Cocaine Abuse; Cannabis Abuse; Substance Induced Mood Disorder; Insomnia  Pt seen and evaluated. Pt agreeable with treatment plan, see orders. Discontinued Seroquel. Pt accepted to go to [facility] next Weds. Trazodone changed to 100 mg qhs last night for continued insomnia which pt said worked well.  Lupe Carney 01/14/2012, 11:49 AM

## 2012-01-14 NOTE — Progress Notes (Signed)
Pt's reports no energy and mild nausea. He has been in the bed on and off throughout the morning. Pt's blood pressure is low. Gave gingerale and encouraged fluids. Held librium due to blood pressure. Offered support and 15 minute checks. Pt resting in his room. Respirations even and unlabored. Safety maintained on unit.

## 2012-01-14 NOTE — Progress Notes (Signed)
Psychoeducational Group Note  Date:  01/14/2012 Time:  1100  Group Topic/Focus:  Relapse Prevention Planning:   The focus of this group is to define relapse and discuss the need for planning to combat relapse.  Participation Level:  Did Not Attend  Additional Comments:  Pt did not attend group.   Dalia Heading 01/14/2012, 6:26 PM

## 2012-01-14 NOTE — Discharge Planning (Signed)
[name] [name] did not attend AM group.  Found him in bed this afternoon, awake, engageable.  States he has been dizzy all day and unable to get out of bed.  States the Dr told him she would discontinue the Seroquel today to see if that helps.  No other complaints.  States he is looking forward to going to [facility] on Wed.  When asked about a safe place to go on Monday, says he can probably stay with sister, who will also transport him Wed AM.  He will talk to her this weekend to confirm.

## 2012-01-14 NOTE — Progress Notes (Signed)
D.  Pt did not attend evening group, stayed in bed.  Had not been feeling well.  Pt did come up to medication window for evening medication and requested Maalox for indigestion.  He also requested and received Gatorade.  Denies SI/HI/hallucinations at this time.  A.  Support and encouragement offered, will continue to monitor.  R.  Pt returned to bed, no distress noted.

## 2012-01-14 NOTE — Progress Notes (Signed)
[facility] Group Notes:  (Counselor/Nursing/MHT/Case Management/Adjunct) 01/13/2012 2200  Type of Therapy:  Karaoke  Participation Level:  Minimal  Participation Quality:  Appropriate  Affect:  Excited  Cognitive:  Alert and Appropriate  Insight:  Good  Engagement in Group:  Good  Engagement in Therapy:  Good  Modes of Intervention:  Socialization  Summary of Progress/Problems:   Thayer Jew 01/14/2012, 1:56 AM

## 2012-01-14 NOTE — Progress Notes (Signed)
[facility] Group Notes:  (Counselor/Nursing/MHT/Case Management/Adjunct)  01/14/2012 1:15 PM  Type of Therapy:  Group Therapy, Dance/Movement Therapy   Participation Level:  Did Not Attend   [facility] 01/14/2012. 2:35 PM

## 2012-01-14 NOTE — Progress Notes (Signed)
[facility] Group Notes:  (Counselor/Nursing/MHT/Case Management/Adjunct)  01/14/2012 3:15 PM  Type of Therapy:  Psychoeducational Skills  Participation Level:  Did Not Attend  Summary of Progress/Problems: Patient did not attend group.    [name] [name] O 01/14/2012, 3:15 PM

## 2012-01-15 DIAGNOSIS — F1910 Other psychoactive substance abuse, uncomplicated: Secondary | ICD-10-CM

## 2012-01-15 DIAGNOSIS — F1010 Alcohol abuse, uncomplicated: Secondary | ICD-10-CM

## 2012-01-15 NOTE — Progress Notes (Signed)
[facility] MD Progress Note  01/15/2012 11:34 AM  Diagnosis:   Axis I: Alcohol Abuse, Substance Abuse and Substance Induced Mood Disorder Axis II: Deferred Axis III:  Past Medical History  Diagnosis Date  . Chest pain, mid sternal   . CAD in native artery     a. Nonobstructive cath 11/2007;  b. Presented with ST elevation - Nonobstructive cath 08/2011  . GERD (gastroesophageal reflux disease)   . Tobacco abuse   . Marijuana abuse     a. uses ~ 1x /wk or less  . History of cocaine abuse     a. quit ? 2009  . History of ETOH abuse     a. drinks 2 "40's" / wk  . Bradycardia     a. asymptomatic  . Bipolar disorder   . Syncope     a. 12/2010 - presumed to be vasovagal  . Abnormal ECG     a. early repolarization  . Coronary artery disease     ADL's:  Intact  Sleep: Good  Appetite:  Good  Suicidal Ideation:  Patient denies any thought, plan, or intent Homicidal Ideation:  Patient denies any thought, plan, or intent  Subjective: [name] [name] was lying in bed, and awakened with verbal prompts. He reports he is feeling "kind of down" today. He was able to ambulate to the consultation room and talk further about his feelings. He is worried about being discharged too soon, and fears that he will relapse before he is able to get [facility]. He denies any withdrawal symptoms or cravings today. He reports that he is sleeping well, and his appetite is good. He denies any suicidal or homicidal ideation. He denies any auditory or visual hallucinations. He reports he has been using substances since about age 48, and has had no period of clean time. He does express a desire to remain abstinent, but does not know how that  AEB (as evidenced by):  Mental Status Examination/Evaluation: Objective:  Appearance: Disheveled  Eye Contact::  Minimal  Speech:  Clear and Coherent and Slow  Volume:  Decreased  Mood:  Depressed  Affect:  Congruent  Thought Process:  Logical  Orientation:  Full  Thought Content:   WDL  Suicidal Thoughts:  No  Homicidal Thoughts:  No  Memory:  Immediate;   Fair Recent;   Fair Remote;   Fair  Judgement:  Impaired  Insight:  Lacking  Psychomotor Activity:  Psychomotor Retardation  Concentration:  Good  Recall:  Good  Akathisia:  No  Handed:    AIMS (if indicated):     Assets:  Resilience  Sleep:  Number of Hours: 6.75    Vital Signs:Blood pressure 97/64, pulse 76, temperature 97 F (36.1 C), temperature source Oral, resp. rate 16, height 5\' 5"  (1.651 m), weight 50.803 kg (112 lb). Current Medications: Current Facility-Administered Medications  Medication Dose Route Frequency Provider Last Rate Last Dose  . acetaminophen (TYLENOL) tablet 650 mg  650 mg Oral Q6H PRN Mickeal Skinner, MD      . alum & mag hydroxide-simeth (MAALOX/MYLANTA) 200-200-20 MG/5ML suspension 30 mL  30 mL Oral Q4H PRN Mickeal Skinner, MD   30 mL at 01/14/12 2154  . chlordiazePOXIDE (LIBRIUM) capsule 25 mg  25 mg Oral Q6H PRN Mickeal Skinner, MD      . chlordiazePOXIDE (LIBRIUM) capsule 25 mg  25 mg Oral BH-qamhs Mickeal Skinner, MD   25 mg at 01/14/12 2154   Followed by  . chlordiazePOXIDE (LIBRIUM) capsule 25 mg  25 mg Oral Daily Mickeal Skinner, MD   25 mg at 01/15/12 0815  . gabapentin (NEURONTIN) capsule 300 mg  300 mg Oral TID Mickeal Skinner, MD   300 mg at 01/15/12 0815  . hydrOXYzine (ATARAX/VISTARIL) tablet 25 mg  25 mg Oral Q6H PRN Mickeal Skinner, MD      . loperamide (IMODIUM) capsule 2-4 mg  2-4 mg Oral PRN Mickeal Skinner, MD      . magnesium hydroxide (MILK OF MAGNESIA) suspension 30 mL  30 mL Oral Daily PRN Mickeal Skinner, MD      . multivitamin with minerals tablet 1 tablet  1 tablet Oral Daily Mickeal Skinner, MD   1 tablet at 01/15/12 0816  . nicotine (NICODERM CQ - dosed in mg/24 hours) patch 21 mg  21 mg Transdermal Q0600 Mickeal Skinner, MD   21 mg at 01/15/12 2956  . ondansetron (ZOFRAN-ODT) disintegrating tablet 4 mg  4 mg Oral Q6H PRN Mickeal Skinner, MD   4 mg at 01/14/12 1332   . thiamine (VITAMIN B-1) tablet 100 mg  100 mg Oral Daily Mickeal Skinner, MD   100 mg at 01/15/12 0816  . traZODone (DESYREL) tablet 100 mg  100 mg Oral QHS Alyson Kuroski-Mazzei, DO   100 mg at 01/14/12 2154  . DISCONTD: QUEtiapine (SEROQUEL XR) 24 hr tablet 150 mg  150 mg Oral Q2000 Mickeal Skinner, MD   150 mg at 01/13/12 2143    Lab Results: No results found for this or any previous visit (from the past 48 hour(s)).  Physical Findings: AIMS: Facial and Oral Movements Muscles of Facial Expression: None, normal Lips and Perioral Area: None, normal Jaw: None, normal Tongue: None, normal,Extremity Movements Upper (arms, wrists, hands, fingers): None, normal Lower (legs, knees, ankles, toes): None, normal, Trunk Movements Neck, shoulders, hips: None, normal, Overall Severity Severity of abnormal movements (highest score from questions above): None, normal Incapacitation due to abnormal movements: None, normal Patient's awareness of abnormal movements (rate only patient's report): No Awareness, Dental Status Current problems with teeth and/or dentures?: No Does patient usually wear dentures?: No  CIWA:  CIWA-Ar Total: 0  COWS:  COWS Total Score: 0   Treatment Plan Summary: Daily contact with patient to assess and evaluate symptoms and progress in treatment Medication management  Plan: We will continue his current, and research followup options. [name] [name] 01/15/2012, 11:34 AM

## 2012-01-15 NOTE — Progress Notes (Signed)
Psychoeducational Group Note  Date:  01/15/2012 Time:  0315   Group Topic/Focus:  Healthy Communication:   The focus of this group is to discuss communication, barriers to communication, as well as healthy ways to communicate with others.  Participation Level:  Did Not Attend   Dalia Heading 01/15/2012, 7:21 PM

## 2012-01-15 NOTE — Progress Notes (Signed)
[facility] Group Notes:  (Counselor/Nursing/MHT/Case Management/Adjunct)  01/15/2012 1:15 PM  Type of Therapy:  Group Therapy, Dance/Movement Therapy   Participation Level:  Did Not Attend   [facility] 01/15/2012. 3:23 PM

## 2012-01-15 NOTE — Progress Notes (Signed)
D.  Pt bright and pleasant on approach, positive for evening group.  Denies SI/HI/hallucinations at this time.  Interacting appropriately within milieu. No complaints voiced at this time.  A.  Support and encouragement offered, will continue to monitor.  R.  Pt sitting in dayroom interacting with peers, no distress noted.

## 2012-01-15 NOTE — Progress Notes (Signed)
Patient ID: [name] [name], male   DOB: [birth year], 46 y.o.   MRN: [id number]  Pt. did not attend aftercare planning group.

## 2012-01-16 NOTE — Progress Notes (Signed)
Patient also stated in group that his girlfriend broke up with him today while he was in the hospital.  Definitely seems like a loss of a significant relationship for this patient.

## 2012-01-16 NOTE — Progress Notes (Signed)
Psychoeducational Group Note  Date:  01/16/2012 Time:  0930  Group Topic/Focus:  Spirituality:   The focus of this group is to discuss how one's spirituality can aide in recovery.  Participation Level:  Minimal  Participation Quality:  Appropriate and Inattentive  Affect:  Appropriate and Flat  Cognitive:  Appropriate  Insight:  Limited  Engagement in Group:  Limited  Additional Comments:  Pt attended Non-denominational spirituality group, discussing "Ending the Cycle".    Dalia Heading 01/16/2012, 10:47 AM

## 2012-01-16 NOTE — Progress Notes (Signed)
Patient ID: [name] [name], male   DOB: May 24, 1966, 45 y.o.   MRN: [id number]  Pt. attended and participated in aftercare planning group. Pt. verbally accepted information on suicide prevention, warning signs to look for with suicide and crisis line numbers to use. Pt. listed their current anxiety level as 2 and their current depression level as 2. Pt is going to [facility] on Wednesday but is afraid that he will relapse if he has idle time in between.

## 2012-01-16 NOTE — Progress Notes (Signed)
Patient ID: [name] [name], male   DOB: Apr 16, 1966, 46 y.o.   MRN: [id number] Pt has been resting quietly tonight, eyes closed, resp reg, unlabored, no c/o's voiced.  Will continue to monitor q 15 minutes for safety.

## 2012-01-16 NOTE — Progress Notes (Signed)
[facility] Group Notes:  (Counselor/Nursing/MHT/Case Management/Adjunct)  01/15/2012 2100 Type of Therapy:  wrap up group  Participation Level:  Minimal  Participation Quality:  Appropriate and Attentive  Affect:  Appropriate  Cognitive:  Appropriate  Insight:  unknown to this Clinical research associate  Engagement in Group:  Good  Engagement in Therapy:  Good  Modes of Intervention:  Clarification, Education and Support  Summary of Progress/Problems: Pt stated "I thought I wanted to return home, but I am going to finish what I started".  Pt was asked to clarify statement by this Clinical research associate.  Patient said that he wants to get straight, get a sponsor, a doctor, and go to further treatment.  Pt mentioned that his fellow patients have been very supportive.    Shelah Lewandowsky 01/16/2012, 3:59 AM

## 2012-01-16 NOTE — Progress Notes (Signed)
[facility] MD Progress Note  01/16/2012 9:52 AM  Diagnosis:   Axis I: Alcohol Abuse and Substance Induced Mood Disorder Axis II: Deferred Axis III:  Past Medical History  Diagnosis Date  . Chest pain, mid sternal   . CAD in native artery     a. Nonobstructive cath 11/2007;  b. Presented with ST elevation - Nonobstructive cath 08/2011  . GERD (gastroesophageal reflux disease)   . Tobacco abuse   . Marijuana abuse     a. uses ~ 1x /wk or less  . History of cocaine abuse     a. quit ? 2009  . History of ETOH abuse     a. drinks 2 "40's" / wk  . Bradycardia     a. asymptomatic  . Bipolar disorder   . Syncope     a. 12/2010 - presumed to be vasovagal  . Abnormal ECG     a. early repolarization  . Coronary artery disease    Subjective: [name] [name] reports that he is doing pretty well today. He does endorse some concern about what is going to happen when he leaves here on Monday, but does not have an appointment to go to [facility] until Wednesday. He denies that he is having any cravings or withdrawal today. He endorses that he slept well last night. He got up and had a good breakfast this morning. He denies any suicidal or homicidal ideation. He denies any auditory or visual hallucinations.   ADL's:  Intact  Sleep: Good  Appetite:  Good  Suicidal Ideation:  Patient denies any thought, plan, or intent Homicidal Ideation:  Patient denies any thought, plan, or intent  AEB (as evidenced by):  Mental Status Examination/Evaluation: Objective:  Appearance: Bizarre  Eye Contact::  Minimal  Speech:  Clear and Coherent  Volume:  Normal  Mood:  Anxious  Affect:  Non-Congruent  Thought Process:  Logical  Orientation:  Full  Thought Content:  WDL  Suicidal Thoughts:  No  Homicidal Thoughts:  No  Memory:  Immediate;   Fair Recent;   Fair Remote;   Good  Judgement:  Fair  Insight:  Fair  Psychomotor Activity:  Normal  Concentration:  Good  Recall:  Good  Akathisia:  No  Handed:    AIMS (if  indicated):     Assets:  Resilience  Sleep:  Number of Hours: 4.75    Vital Signs:Blood pressure 99/60, pulse 72, temperature 97.5 F (36.4 C), temperature source Oral, resp. rate 16, height 5\' 5"  (1.651 m), weight 50.803 kg (112 lb). Current Medications: Current Facility-Administered Medications  Medication Dose Route Frequency Provider Last Rate Last Dose  . acetaminophen (TYLENOL) tablet 650 mg  650 mg Oral Q6H PRN Mickeal Skinner, MD      . alum & mag hydroxide-simeth (MAALOX/MYLANTA) 200-200-20 MG/5ML suspension 30 mL  30 mL Oral Q4H PRN Mickeal Skinner, MD   30 mL at 01/15/12 1650  . gabapentin (NEURONTIN) capsule 300 mg  300 mg Oral TID Mickeal Skinner, MD   300 mg at 01/16/12 0803  . magnesium hydroxide (MILK OF MAGNESIA) suspension 30 mL  30 mL Oral Daily PRN Mickeal Skinner, MD      . multivitamin with minerals tablet 1 tablet  1 tablet Oral Daily Mickeal Skinner, MD   1 tablet at 01/16/12 0803  . nicotine (NICODERM CQ - dosed in mg/24 hours) patch 21 mg  21 mg Transdermal Q0600 Mickeal Skinner, MD   21 mg at 01/16/12 0645  . thiamine (VITAMIN B-1)  tablet 100 mg  100 mg Oral Daily Mickeal Skinner, MD   100 mg at 01/16/12 0803  . traZODone (DESYREL) tablet 100 mg  100 mg Oral QHS Alyson Kuroski-Mazzei, DO   100 mg at 01/15/12 2129    Lab Results: No results found for this or any previous visit (from the past 48 hour(s)).  Physical Findings: AIMS: Facial and Oral Movements Muscles of Facial Expression: None, normal Lips and Perioral Area: None, normal Jaw: None, normal Tongue: None, normal,Extremity Movements Upper (arms, wrists, hands, fingers): None, normal Lower (legs, knees, ankles, toes): None, normal, Trunk Movements Neck, shoulders, hips: None, normal, Overall Severity Severity of abnormal movements (highest score from questions above): None, normal Incapacitation due to abnormal movements: None, normal Patient's awareness of abnormal movements (rate only patient's report): No  Awareness, Dental Status Current problems with teeth and/or dentures?: No Does patient usually wear dentures?: No  CIWA:  CIWA-Ar Total: 0  COWS:  COWS Total Score: 0   Treatment Plan Summary: Daily contact with patient to assess and evaluate symptoms and progress in treatment Medication management  Plan: We will continue his current plan of care, with a plan discharge for tomorrow, and subsequent followup at [facility] on Wednesday.  Keiona Jenison 01/16/2012, 9:52 AM

## 2012-01-16 NOTE — Progress Notes (Signed)
[facility] Group Notes:  (Counselor/Nursing/MHT/Case Management/Adjunct)  01/16/2012 1:15 PM  Type of Therapy:  Group Therapy, Dance/Movement Therapy   Participation Level:  Active  Participation Quality:  Appropriate, Attentive and Sharing  Affect:  Appropriate  Cognitive:  Appropriate  Insight:  Limited  Engagement in Group:  Limited  Engagement in Therapy:  Limited  Modes of Intervention:  Clarification, Problem-solving, Role-play, Socialization and Support  Summary of Progress/Problems: Pt spoke about how support relates to support we get from others and the support we give ourselves. Pt participated in the reading of the 12 Promises and how these offer hope that things will get better.  [name] [name] 01/16/2012. 2:45 PM  

## 2012-01-16 NOTE — Progress Notes (Signed)
[facility] Group Notes:  (Counselor/Nursing/MHT/Case Management/Adjunct)  01/16/2012 6:52 PM  Type of Therapy:  Psychoeducational Skills  Participation Level:  None  Participation Quality:  Appropriate and Inattentive  Affect:  Appropriate and Flat  Cognitive:  Appropriate  Engagement in Group:  None  Engagement in Therapy:  None  Modes of Intervention:  Activity, Socialization and Support  Summary of Progress/Problems:Pt attended group activity consisting of playing "Buzz Word". Group processed what they got out of this activity at the end of group.      Dalia Heading 01/16/2012, 6:52 PM

## 2012-01-16 NOTE — Progress Notes (Signed)
D- Patient is out in milieu but did not attend am groups this am.  A- Appropriate but superficially bright.  Rates depression at 2 and hopelessness at 2. Denies SI.No physical problems and no prn medications requested.  R-Continue current POC. Encourage participation in d/c planning. Support offered. 15' checks cont for safety.

## 2012-01-17 MED ORDER — TRAZODONE HCL 50 MG PO TABS
150.0000 mg | ORAL_TABLET | Freq: Every day | ORAL | Status: DC
  Administered 2012-01-17: 150 mg via ORAL
  Filled 2012-01-17: qty 1
  Filled 2012-01-17: qty 3
  Filled 2012-01-17 (×2): qty 1
  Filled 2012-01-17: qty 42

## 2012-01-17 NOTE — Discharge Planning (Signed)
Tania attended AM group, good participation.  States he is feeling much better, but can't go to stay with sister until Wed as we discussed on Friday.  No other safe place to go, and convinced he will relapse if [facility] to [facility].  Told him we would discuss in treatment team with Dr.  In treatment team, he was open to staying with uncle of daughters until [facility] appointment, and was going to get numbers from his phone in locker to call.

## 2012-01-17 NOTE — Progress Notes (Signed)
[facility] Case Management Discharge Plan:  Will you be returning to the same living situation after discharge: No. At discharge, do you have transportation home?:Yes,  bus pass Do you have the ability to pay for your medications:Yes,  mental health  Interagency Information:     Release of information consent forms completed and in the chart;  Patient's signature needed at discharge.  Patient to Follow up at:  Follow-up Information    Follow up with [facility] on 01/19/2012. (Arrive there promptly at 8:00 am!!)    Contact information:   [address] [place], [place] [address] [phone]         Patient denies SI/HI:   Yes,  yes    Safety Planning and Suicide Prevention discussed:  Yes,  yes  Barrier to discharge identified:No.  Summary and Recommendations:   [name] [name] 01/17/2012, 10:59 AM

## 2012-01-17 NOTE — Progress Notes (Signed)
Pt has been up for some groups today.  He rated his sleep as poor 2-3 hours but documented by staff as 4.75 hours.   His appetite is poor energy level normal and ability to pay attention as improving.  He rated his depression a 7 and hopelessness a 1 and denied anxiety on his self-inventory.  He denied any S/H ideation or A/V hallucinations.  [facility] was to [facility] but had no safe place to stay and he understands he will receive a bill from here.   He has had maalox twice today which helped each time.  He stated,"I usually take acid reflux medicine at home"  He plans to talk with the doctor.

## 2012-01-17 NOTE — Progress Notes (Signed)
[facility] MD Progress Note  01/17/2012 7:20 PM  S/O: Patient seen and evaluated. Chart reviewed. Patient stated that his mood was "great".  His affect was mood congruent and euthymic. He denied any current thoughts of self injurious behavior, suicidal ideation or homicidal ideation. There were no auditory or visual hallucinations, paranoia, delusional thought processes, or mania noted.  Thought process was linear and goal directed.  No psychomotor agitation or retardation was noted. His speech was normal rate, tone and volume. Eye contact was good. Judgment and insight are fair.  Patient has been up and engaged on the unit.  No acute safety concerns reported from team. Pt declined discharge and did not line up a place to stay until placement at [facility] on Weds.  Sleep:  Number of Hours: 4.75    Vital Signs:Blood pressure 120/72, pulse 79, temperature 98.2 F (36.8 C), temperature source Oral, resp. rate 16, height 5\' 5"  (1.651 m), weight 50.803 kg (112 lb).  Current Medications:     . gabapentin  300 mg Oral TID  . multivitamin with minerals  1 tablet Oral Daily  . nicotine  21 mg Transdermal Q0600  . thiamine  100 mg Oral Daily  . traZODone  100 mg Oral QHS    Lab Results: No results found for this or any previous visit (from the past 48 hour(s)).  A/P: Alcohol Dependence; Cocaine Abuse; Cannabis Abuse; Substance Induced Mood Disorder; Insomnia  Pt seen and evaluated. Pt agreeable with treatment plan, see orders. Pt accepted to go to [facility] on Weds. Trazodone changed to 150 mg qhs for continued insomnia which pt said worked well in past.  Lupe Carney 01/17/2012, 7:20 PM

## 2012-01-17 NOTE — Progress Notes (Addendum)
[facility] Group Notes:  (Counselor/Nursing/MHT/Case Management/Adjunct)  01/17/2012  2:30 PM  Type of Therapy: Group Therapy   Participation Level: Did Not attend Group     Christen Butter 01/17/2012  2:30 PM

## 2012-01-17 NOTE — Progress Notes (Signed)
Psychoeducational Group Note  Date:  01/17/2012 Time:  1000  Group Topic/Focus:  Wellness Toolbox:   The focus of this group is to discuss various aspects of wellness, balancing those aspects and exploring ways to increase the ability to experience wellness.  Patients will create a wellness toolbox for use upon discharge.  Participation Level:  Active  Participation Quality:  Appropriate, Sharing and Supportive  Affect:  Appropriate  Cognitive:  Appropriate  Insight:  Good  Engagement in Group:  Good  Additional Comments:  none  [name] [name] M 01/17/2012, 11:03 AM

## 2012-01-17 NOTE — Progress Notes (Signed)
Patient ID: SCHYLER COUNSELL, male   DOB: 11-08-1965, 46 y.o.   MRN: [id number]  Problem: Alcohol Dependence, cocaine abuse, marijuana abuse  D: Pt with dull, flat affect and is isolative to his room. Pt with no peer interaction.  A: Monitor patient Q 15 minutes for safety, encourage staff/peer interaction and group participation. Administer medications as ordered by MD.  R: Pt compliant with medications, but declined to attend group session. Pt denies SI at this time. No s/s of distress noted.

## 2012-01-17 NOTE — Progress Notes (Signed)
Patient did not attend this evenings speaker AA meeting.  Pt was notified of meeting, came to meeting, but returned to bed very shortly after meeting began and went to sleep.

## 2012-01-17 NOTE — Progress Notes (Signed)
[facility] Group Notes:  (Counselor/Nursing/MHT/Case Management/Adjunct)  01/17/2012 6:41 PM  Type of Therapy:  Psychoeducational Skills  Participation Level:  Active  Participation Quality:  Appropriate  Affect:  Anxious and Appropriate  Cognitive:  Appropriate  Insight:  Good  Engagement in Group:  Good  Engagement in Therapy:  Good  Modes of Intervention:  Support  Summary of Progress/Problems: Pt attended the 1100 Group, but was visibly upset due to feelings of not being supported by family. Pt requested that we retrieve his daughter's phone number from the wallet in his locker in an effort to resolve his feelings, which the Writer did following the Group.   Christ Kick 01/17/2012, 6:41 PM

## 2012-01-18 MED ORDER — ASPIRIN EC 81 MG PO TBEC: 81.0000 mg | DELAYED_RELEASE_TABLET | Freq: Every day | ORAL | Status: DC

## 2012-01-18 MED ORDER — GABAPENTIN 300 MG PO CAPS: 300.0000 mg | ORAL_CAPSULE | Freq: Three times a day (TID) | ORAL | Status: DC

## 2012-01-18 MED ORDER — ASPIRIN EC 81 MG PO TBEC
81.0000 mg | DELAYED_RELEASE_TABLET | Freq: Every day | ORAL | Status: DC
  Filled 2012-01-18 (×2): qty 14

## 2012-01-18 MED ORDER — TRAZODONE HCL 150 MG PO TABS: 150.0000 mg | ORAL_TABLET | Freq: Every day | ORAL | Status: DC

## 2012-01-18 NOTE — Progress Notes (Signed)
[facility] Group Notes:  (Counselor/Nursing/MHT/Case Management/Adjunct)  01/18/2012   Type of Therapy:  Group Therapy at 1:15 to 2:30 PM  Participation Level:  Minimal  Participation Quality:  Attentive  Affect:  Hesitant  Cognitive:  Alert and Oriented  Insight:  Limited  Engagement in Group:  Limited  Engagement in Therapy:  Limited  Modes of Intervention:  Clarification, Education, Socialization and Support  Summary of Progress/Problems:  Patient was attentive to presentation by Interior and spatial designer of  [employer] [place] ([employer]).  Group discussion included what patients see as their own obstacles to recovery. James shared belief that living situation will be difficult as he is uncertain where that will be.  Patient was offered encouragement by others that [facility] will help him with discharge.   [name] 01/18/2012, 5:15 PM

## 2012-01-18 NOTE — Progress Notes (Signed)
[facility] Case Management Discharge Plan:  Will you be returning to the same living situation after discharge: No. At discharge, do you have transportation home?:Yes,  family Do you have the ability to pay for your medications:Yes,  mental health  Interagency Information:     Release of information consent forms completed and in the chart;  Patient's signature needed at discharge.  Patient to Follow up at:  Follow-up Information    Follow up with [facility] on 01/19/2012. (Arrive there promptly at 8:00 am!!)    Contact information:   [address] [place], [place] [address] [phone]         Patient denies SI/HI:   Yes,  yes    Safety Planning and Suicide Prevention discussed:  Yes,  yes  Barrier to discharge identified:No.  Summary and Recommendations:   Ida Rogue 01/18/2012, 12:37 PM

## 2012-01-18 NOTE — Discharge Planning (Signed)
[name] [name] did not discharge yesterday as he found no safe place to go, and was convinced he would relapse if d/ced prior to his admission date at [facility].  Will transfer to [facility] tomorrow.

## 2012-01-18 NOTE — Progress Notes (Signed)
Pt did not attend AA group, stating that he 'was too tired.'
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
## 2012-01-18 NOTE — Progress Notes (Signed)
[facility] Group Notes:  (Counselor/Nursing/MHT/Case Management/Adjunct)  01/18/2012 11:34 AM  Type of Therapy:  Psychoeducational Skills  Participation Level:  Minimal  Participation Quality:  Appropriate, Attentive and Distracted  Affect:  Anxious and Irritable  Cognitive:  Appropriate and Oriented  Insight:  Limited  Engagement in Group:  Limited  Engagement in Therapy:  n/a  Modes of Intervention:  Activity, Education, Problem-solving, Socialization and Support  Summary of Progress/Problems: [name] [name] attended Psycho-educational group "Is my anger due to unmet expectations"? Group read and discussed what anger is, how it manifests physically, how it intervenes with recovery, and how to avert it by looking at and changing unrealistic expectations. [name] [name] was quiet but did state he was angry today. [name] [name] appeared distracted     Wandra Scot 01/18/2012, 11:34 AM

## 2012-01-18 NOTE — Progress Notes (Signed)
[facility] Group Notes:  (Counselor/Nursing/MHT/Case Management/Adjunct)  01/18/2012 1:59 PM  Type of Therapy:  Psychoeducational Skills  Participation Level:  Active  Participation Quality:  Appropriate  Affect:  Appropriate  Cognitive:  Appropriate  Insight:  Good  Engagement in Group:  Good  Engagement in Therapy:  Good  Modes of Intervention:  Support  Summary of Progress/Problems: Pt attended the 1100 group and participated in the discussion about recovery goals. 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   Christ Kick 01/18/2012, 1:59 PM

## 2012-01-18 NOTE — Progress Notes (Signed)
D:  Patient up and active on the unit.  Attends groups, but is minimally interactive.  Came to the desk this morning stating he wanted to be discharged, but has changed his mind and agrees to go to [facility] tomorrow.  Affect bright.   A:  Charge nurse spoke with patient one on one and he agreed to stay on until tomorrow.  States he has 'had a lot on his mind.'   R:  Pleasant and cooperative with staff and peers.  Remains ambivalent about long term treatment.

## 2012-01-18 NOTE — Progress Notes (Signed)
01/18/2012      Time: 1500      Group Topic/Focus: The focus of this group is on discussing various styles of communication and communicating assertively using 'I' (feeling) statements.  Participation Level: Active  Participation Quality: Redirectable  Affect: Appropriate  Cognitive: Alert   Additional Comments: Patient had trouble understanding the activity, but participated appropriate with assistance.  [name] [name] 01/18/2012 3:44 PM

## 2012-01-19 NOTE — Progress Notes (Signed)
Pt was discharged at 0630. He was provided medications, prescriptions and discharge instructions. He was able to verbalize understanding of the instructions and offered no questions or concerns. He was pleasant and cooperative. He denied SI/HI/AVH. He refused a nicotine patch and removed his from the previous day. His belongings were returned and he was escorted to lobby to wait for his ride.

## 2012-01-21 NOTE — Progress Notes (Signed)
Patient Discharge Instructions:  After Visit Summary (AVS):   Faxed to:  01/21/2012 Psychiatric Admission Assessment Note:   Faxed to:  01/21/2012 Suicide Risk Assessment - Discharge Assessment:   Faxed to:  01/21/2012 Faxed/Sent to the Next Level Care provider:  01/21/2012  Faxed to [facility] @ [phone]  Wandra Scot, 01/21/2012, 2:33 PM

## 2012-01-24 ENCOUNTER — Emergency Department (HOSPITAL_BASED_OUTPATIENT_CLINIC_OR_DEPARTMENT_OTHER)
Admission: EM | Admit: 2012-01-24 | Discharge: 2012-01-24 | Disposition: A | Discharge status: Home / Self Care | Payer: Medicaid Other | Attending: Emergency Medicine | Admitting: Emergency Medicine

## 2012-01-24 ENCOUNTER — Encounter (HOSPITAL_BASED_OUTPATIENT_CLINIC_OR_DEPARTMENT_OTHER): Payer: Self-pay | Admitting: *Deleted

## 2012-01-24 DIAGNOSIS — R0789 Other chest pain: Secondary | ICD-10-CM | POA: Insufficient documentation

## 2012-01-24 DIAGNOSIS — Z7982 Long term (current) use of aspirin: Secondary | ICD-10-CM | POA: Insufficient documentation

## 2012-01-24 DIAGNOSIS — I2510 Atherosclerotic heart disease of native coronary artery without angina pectoris: Secondary | ICD-10-CM | POA: Insufficient documentation

## 2012-01-24 DIAGNOSIS — K219 Gastro-esophageal reflux disease without esophagitis: Secondary | ICD-10-CM | POA: Insufficient documentation

## 2012-01-24 DIAGNOSIS — Z888 Allergy status to other drugs, medicaments and biological substances status: Secondary | ICD-10-CM | POA: Insufficient documentation

## 2012-01-24 DIAGNOSIS — F17200 Nicotine dependence, unspecified, uncomplicated: Secondary | ICD-10-CM | POA: Insufficient documentation

## 2012-01-24 DIAGNOSIS — R110 Nausea: Secondary | ICD-10-CM | POA: Insufficient documentation

## 2012-01-24 DIAGNOSIS — F1210 Cannabis abuse, uncomplicated: Secondary | ICD-10-CM | POA: Insufficient documentation

## 2012-01-24 DIAGNOSIS — F319 Bipolar disorder, unspecified: Secondary | ICD-10-CM | POA: Insufficient documentation

## 2012-01-24 DIAGNOSIS — R9431 Abnormal electrocardiogram [ECG] [EKG]: Secondary | ICD-10-CM | POA: Insufficient documentation

## 2012-01-24 DIAGNOSIS — R55 Syncope and collapse: Secondary | ICD-10-CM | POA: Insufficient documentation

## 2012-01-24 LAB — CBC WITH DIFFERENTIAL/PLATELET
Basophils Absolute: 0 10*3/uL (ref 0.0–0.1)
Basophils Relative: 0 % (ref 0–1)
Hemoglobin: 13.3 g/dL (ref 13.0–17.0)
Lymphocytes Relative: 32 % (ref 12–46)
MCHC: 35.3 g/dL (ref 30.0–36.0)
Monocytes Relative: 11 % (ref 3–12)
Neutro Abs: 3.5 10*3/uL (ref 1.7–7.7)
Neutrophils Relative %: 54 % (ref 43–77)
WBC: 6.5 10*3/uL (ref 4.0–10.5)

## 2012-01-24 LAB — COMPREHENSIVE METABOLIC PANEL
AST: 22 U/L (ref 0–37)
Albumin: 3.8 g/dL (ref 3.5–5.2)
Alkaline Phosphatase: 66 U/L (ref 39–117)
BUN: 12 mg/dL (ref 6–23)
Chloride: 101 mEq/L (ref 96–112)
Potassium: 4.5 mEq/L (ref 3.5–5.1)
Total Bilirubin: 0.2 mg/dL — ABNORMAL LOW (ref 0.3–1.2)

## 2012-01-24 MED ORDER — SODIUM CHLORIDE 0.9 % IV SOLN
Freq: Once | INTRAVENOUS | Status: AC
  Administered 2012-01-24: 20:00:00 via INTRAVENOUS

## 2012-01-24 MED ORDER — SODIUM CHLORIDE 0.9 % IV SOLN
Freq: Once | INTRAVENOUS | Status: AC
  Administered 2012-01-24: 1000 mL via INTRAVENOUS

## 2012-01-24 MED ORDER — PROMETHAZINE HCL 25 MG PO TABS: 25.0000 mg | ORAL_TABLET | Freq: Four times a day (QID) | ORAL | Status: DC | PRN

## 2012-01-24 MED ORDER — IBUPROFEN 400 MG PO TABS
400.0000 mg | ORAL_TABLET | Freq: Once | ORAL | Status: AC
  Administered 2012-01-24: 400 mg via ORAL
  Filled 2012-01-24: qty 1

## 2012-01-24 MED ORDER — FAMOTIDINE 20 MG PO TABS: 20.0000 mg | ORAL_TABLET | Freq: Two times a day (BID) | ORAL | Status: DC

## 2012-01-24 MED ORDER — ONDANSETRON HCL 4 MG/2ML IJ SOLN
4.0000 mg | Freq: Once | INTRAMUSCULAR | Status: AC
  Administered 2012-01-24: 4 mg via INTRAVENOUS
  Filled 2012-01-24: qty 2

## 2012-01-24 NOTE — ED Notes (Signed)
Pt c/o headache requesting morphine for pain. PA made aware. Order for ibuprofen received and initiated.

## 2012-01-24 NOTE — ED Notes (Signed)
Has been at [facility] x 5 days. Detox from alcohol and crack cocaine. Here today with c.o nausea, lightheadedness x 4 days.

## 2012-01-24 NOTE — ED Provider Notes (Signed)
Medical screening examination/treatment/procedure(s) were performed by non-physician practitioner and as supervising physician I was immediately available for consultation/collaboration.   Gwyneth Sprout, MD 01/24/12 2317

## 2012-01-24 NOTE — ED Notes (Signed)
PA at bedside to evaluate pt now. 

## 2012-01-24 NOTE — ED Provider Notes (Signed)
History     CSN: [id number]  Arrival date & time 01/24/12  1845   First MD Initiated Contact with Patient 01/24/12 1945      Chief Complaint  Patient presents with  . Nausea    (Consider location/radiation/quality/duration/timing/severity/associated sxs/prior treatment) Patient is a 46 y.o. male presenting with abdominal pain. The history is provided by the patient. No language interpreter was used.  Abdominal Pain The primary symptoms of the illness include abdominal pain. Episode onset: 4 days. The onset of the illness was gradual. The problem has not changed since onset. The illness is associated with alcohol use. Symptoms associated with the illness do not include back pain.   Pt complains of nausea and feeling dizzy.  Pt reports he is at [facility] in treatment.  Pt reports the nausea starts when he takes his medication Past Medical History  Diagnosis Date  . Chest pain, mid sternal   . CAD in native artery     a. Nonobstructive cath 11/2007;  b. Presented with ST elevation - Nonobstructive cath 08/2011  . GERD (gastroesophageal reflux disease)   . Tobacco abuse   . Marijuana abuse     a. uses ~ 1x /wk or less  . History of cocaine abuse     a. quit ? 2009  . History of ETOH abuse     a. drinks 2 "40's" / wk  . Bradycardia     a. asymptomatic  . Bipolar disorder   . Syncope     a. 12/2010 - presumed to be vasovagal  . Abnormal ECG     a. early repolarization  . Coronary artery disease     History reviewed. No pertinent past surgical history.  No family history on file.  History  Substance Use Topics  . Smoking status: Current Everyday Smoker -- 0.5 packs/day for 20 years    Types: Cigarettes  . Smokeless tobacco: Not on file   Comment: Smokes about 3 cigarettes/day  . Alcohol Use: Yes     4-5 40oz/day      Review of Systems  Gastrointestinal: Positive for abdominal pain.  Musculoskeletal: Negative for back pain.  All other systems reviewed and are  negative.    Allergies  Aspirin and Tylenol  Home Medications   Current Outpatient Rx  Name Route Sig Dispense Refill  . ASPIRIN EC 81 MG PO TBEC Oral Take 1 tablet (81 mg total) by mouth daily. Each day for heart attack prevention.    Marland Kitchen GABAPENTIN 300 MG PO CAPS Oral Take 1 capsule (300 mg total) by mouth 3 (three) times daily. For anxiety and pain. 90 capsule 1  . TRAZODONE HCL 150 MG PO TABS Oral Take 1 tablet (150 mg total) by mouth at bedtime. For insomnia. 30 tablet 0    BP 120/74  Pulse 71  Temp 98 F (36.7 C) (Oral)  Resp 22  SpO2 99%  Physical Exam  Nursing note and vitals reviewed. Constitutional: He is oriented to person, place, and time. He appears well-developed and well-nourished.  HENT:  Head: Normocephalic and atraumatic.  Eyes: Conjunctivae and EOM are normal. Pupils are equal, round, and reactive to light.  Neck: Normal range of motion. Neck supple.  Cardiovascular: Normal rate.   Pulmonary/Chest: Effort normal and breath sounds normal.  Abdominal: Soft.  Musculoskeletal: Normal range of motion.  Neurological: He is alert and oriented to person, place, and time.  Skin: Skin is warm.  Psychiatric: He has a normal mood and affect.  ED Course  Procedures (including critical care time)  Labs Reviewed  CBC WITH DIFFERENTIAL - Abnormal; Notable for the following:    HCT 37.7 (*)     All other components within normal limits  COMPREHENSIVE METABOLIC PANEL - Abnormal; Notable for the following:    Glucose, Bld 108 (*)     Total Bilirubin 0.2 (*)     GFR calc non Af Amer 64 (*)     GFR calc Af Amer 74 (*)     All other components within normal limits  LIPASE, BLOOD - Abnormal; Notable for the following:    Lipase 72 (*)     All other components within normal limits   No results found. Pt given ibuprofen for headache.  Pt given zofran for nausea.   Pt counseled on results.   Pt given rx for pepcid and phenergan.   1. Nausea       MDM           Elson Areas, PA 01/24/12 2147  Lonia Skinner [place], [place] 01/24/12 2151

## 2012-01-30 NOTE — Discharge Summary (Signed)
Physician Discharge Summary Note  Patient:  [name] [name] is an 46 y.o., male MRN:  [id number] DOB:  Oct 20, 1965 Patient phone:  [phone] (home)  Patient address:   [address] [place] [address],   Date of Admission:  01/11/2012  Reason for Admission: see H&P.  Principal Problem:  *Alcohol dependence Active Problems:  Substance induced mood disorder  Cocaine abuse  Cannabis abuse  Discharge Diagnoses: Alcohol Dependence; Cocaine Abuse; Cannabis Abuse; Substance Induced Mood Disorder, resolved   Past Medical History   Diagnosis  Date   .  Chest pain, mid sternal    .  CAD in native artery      a. Nonobstructive cath 11/2007; b. Presented with ST elevation - Nonobstructive cath 08/2011   .  GERD (gastroesophageal reflux disease)    .  Tobacco abuse    .  Marijuana abuse      a. uses ~ 1x /wk or less   .  History of cocaine abuse      a. quit ? 2009   .  History of ETOH abuse      a. drinks 2 "40's" / wk   .  Bradycardia      a. asymptomatic   .  Bipolar disorder    .  Syncope      a. 12/2010 - presumed to be vasovagal   .  Abnormal ECG      a. early repolarization   .  Coronary artery disease      Level of Care:  [facility]  Hospital Course:  Pt admitted for crisis stabilization, detox and treatment. Pt attended all therapeutic groups, was active in his treatment planning process and agreed the current medication regimen for further stability during recovery. There were no acute issues during treatment and he was open to further substance abuse Tx.  All labs were reviewed in great detail and medical/psychiatric needs were addressed.  No acute safety issues were noted on the unit. Medications were reviewed with pt and medication education was provided. Mental health treatment, medication management and continued sobriety will mitigate against any potential increased risk of harm to self and/or others.  Discussed the importance of recovery with pt, as well as, tools  to move forward in a healthy & safe manner using the 12 Step Process.    Consults: none.  Significant Diagnostic Studies:  See labs.  Discharge Vitals:   Blood pressure 95/57, pulse 78, temperature 98 F (36.7 C), temperature source Oral, resp. rate 16, height 5\' 5"  (1.651 m), weight 50.803 kg (112 lb).  Mental Status Exam: See Mental Status Examination and Suicide Risk Assessment completed by Attending Physician prior to discharge.  Discharge destination:  [facility]  Is patient on multiple antipsychotic therapies at discharge:  No   Has Patient had three or more failed trials of antipsychotic monotherapy by history:  No  Recommended Plan for Multiple Antipsychotic Therapies: NA.  Discharge Orders    Future Appointments: Provider: Department: Dept Phone: Center:   02/04/2012 8:50 AM [facility] [phone] [email or web address]     Future Orders Please Complete By Expires   Diet - low sodium heart healthy      Increase activity slowly      Discharge instructions      Comments:   Take all of your medications as prescribed.  Be sure to keep ALL follow up appointments as scheduled. This is to ensure getting your refills on time to avoid any interruption in  your medication.  If you find that you can not keep your appointment, call the clinic and reschedule. Be sure to tell the nurse if you will need a refill before your appointment.     Medication List  As of 01/30/2012  9:33 PM   TAKE these medications      Indication    aspirin EC 81 MG tablet   Take 1 tablet (81 mg total) by mouth daily. Each day for heart attack prevention.    Indication: Heart Attack      gabapentin 300 MG capsule   Commonly known as: NEURONTIN   Take 1 capsule (300 mg total) by mouth 3 (three) times daily. For anxiety and pain.    Indication: Neuropathic Pain      traZODone 150 MG tablet   Commonly known as: DESYREL   Take 1 tablet (150 mg total) by mouth at bedtime. For  insomnia.    Indication: Trouble Sleeping           Follow-up Information    Follow up with [facility] on 01/19/2012. (Arrive there promptly at 8:00 am!!)    Contact information:   [address] [place], [place] [address] [phone]         Plan Of Care/Follow-up recommendations: Pt seen and evaluated. Chart reviewed. Pt stable for and requesting discharge to [facility] Tx. Pt contracting for safety and does not currently meet [place] involuntary commitment criteria for continued hospitalization against his will. Mental health treatment, medication management and continued sobriety will mitigate against the potential increased risk of harm to self and/or others. Discussed the importance of recovery further with pt, as well as, tools to move forward in a healthy & safe manner. Pt agreeable with the plan. Discussed with the team. Recommend follow up with AA/NA. Diet: Heart Healthy. Activity: As tolerated.   Signed: Lupe Carney 01/30/2012, 9:33 PM

## 2012-02-04 ENCOUNTER — Other Ambulatory Visit: Payer: Medicaid Other

## 2012-02-08 ENCOUNTER — Emergency Department (HOSPITAL_COMMUNITY)
Admission: EM | Admit: 2012-02-08 | Discharge: 2012-02-09 | Disposition: A | Discharge status: Home / Self Care | Payer: Medicaid Other | Attending: Emergency Medicine | Admitting: Emergency Medicine

## 2012-02-08 ENCOUNTER — Emergency Department (HOSPITAL_COMMUNITY): Payer: Medicaid Other

## 2012-02-08 ENCOUNTER — Encounter (HOSPITAL_COMMUNITY): Payer: Self-pay | Admitting: Neurology

## 2012-02-08 DIAGNOSIS — Z7982 Long term (current) use of aspirin: Secondary | ICD-10-CM | POA: Insufficient documentation

## 2012-02-08 DIAGNOSIS — F319 Bipolar disorder, unspecified: Secondary | ICD-10-CM | POA: Insufficient documentation

## 2012-02-08 DIAGNOSIS — R079 Chest pain, unspecified: Secondary | ICD-10-CM | POA: Insufficient documentation

## 2012-02-08 DIAGNOSIS — F17200 Nicotine dependence, unspecified, uncomplicated: Secondary | ICD-10-CM | POA: Insufficient documentation

## 2012-02-08 DIAGNOSIS — R109 Unspecified abdominal pain: Secondary | ICD-10-CM

## 2012-02-08 DIAGNOSIS — I2510 Atherosclerotic heart disease of native coronary artery without angina pectoris: Secondary | ICD-10-CM | POA: Insufficient documentation

## 2012-02-08 DIAGNOSIS — K219 Gastro-esophageal reflux disease without esophagitis: Secondary | ICD-10-CM | POA: Insufficient documentation

## 2012-02-08 DIAGNOSIS — Z79899 Other long term (current) drug therapy: Secondary | ICD-10-CM | POA: Insufficient documentation

## 2012-02-08 LAB — URINALYSIS, ROUTINE W REFLEX MICROSCOPIC
Glucose, UA: NEGATIVE mg/dL
Nitrite: NEGATIVE
Protein, ur: NEGATIVE mg/dL
pH: 7.5 (ref 5.0–8.0)

## 2012-02-08 LAB — COMPREHENSIVE METABOLIC PANEL
AST: 21 U/L (ref 0–37)
Albumin: 3.7 g/dL (ref 3.5–5.2)
Calcium: 9.5 mg/dL (ref 8.4–10.5)
Creatinine, Ser: 1.12 mg/dL (ref 0.50–1.35)
GFR calc non Af Amer: 77 mL/min — ABNORMAL LOW (ref >90)

## 2012-02-08 LAB — CBC WITH DIFFERENTIAL/PLATELET
Basophils Absolute: 0 10*3/uL (ref 0.0–0.1)
Basophils Relative: 0 % (ref 0–1)
Eosinophils Relative: 3 % (ref 0–5)
HCT: 39.5 % (ref 39.0–52.0)
MCHC: 35.7 g/dL (ref 30.0–36.0)
MCV: 87.8 fL (ref 78.0–100.0)
Monocytes Absolute: 0.5 10*3/uL (ref 0.1–1.0)
RDW: 12.6 % (ref 11.5–15.5)

## 2012-02-08 LAB — RAPID URINE DRUG SCREEN, HOSP PERFORMED
Barbiturates: NOT DETECTED
Cocaine: NOT DETECTED
Tetrahydrocannabinol: POSITIVE — AB

## 2012-02-08 LAB — URINE MICROSCOPIC-ADD ON

## 2012-02-08 LAB — POCT I-STAT TROPONIN I: Troponin i, poc: 0 ng/mL (ref 0.00–0.08)

## 2012-02-08 MED ORDER — MORPHINE SULFATE 4 MG/ML IJ SOLN
4.0000 mg | Freq: Once | INTRAMUSCULAR | Status: AC
  Administered 2012-02-08: 4 mg via INTRAVENOUS
  Filled 2012-02-08: qty 1

## 2012-02-08 MED ORDER — MORPHINE SULFATE 4 MG/ML IJ SOLN
4.0000 mg | Freq: Once | INTRAMUSCULAR | Status: DC
  Filled 2012-02-08: qty 1

## 2012-02-08 MED ORDER — NITROGLYCERIN 0.4 MG SL SUBL
0.4000 mg | SUBLINGUAL_TABLET | SUBLINGUAL | Status: DC | PRN
  Filled 2012-02-08: qty 75

## 2012-02-08 MED ORDER — ONDANSETRON HCL 4 MG PO TABS: 4.0000 mg | ORAL_TABLET | Freq: Three times a day (TID) | ORAL | Status: AC | PRN

## 2012-02-08 MED ORDER — TETANUS-DIPHTHERIA TOXOIDS TD 5-2 LFU IM INJ: 0.5000 mL | INJECTION | Freq: Once | INTRAMUSCULAR | Status: DC

## 2012-02-08 MED ORDER — LORAZEPAM 2 MG/ML IJ SOLN
1.0000 mg | Freq: Once | INTRAMUSCULAR | Status: AC
  Administered 2012-02-08: 1 mg via INTRAVENOUS
  Filled 2012-02-08: qty 1

## 2012-02-08 MED ORDER — TRAMADOL HCL 50 MG PO TABS: 50.0000 mg | ORAL_TABLET | Freq: Four times a day (QID) | ORAL | Status: AC | PRN

## 2012-02-08 MED ORDER — GI COCKTAIL ~~LOC~~
30.0000 mL | Freq: Once | ORAL | Status: AC
  Administered 2012-02-08: 30 mL via ORAL
  Filled 2012-02-08: qty 30

## 2012-02-08 MED ORDER — SODIUM CHLORIDE 0.9 % IV BOLUS (SEPSIS)
500.0000 mL | Freq: Once | INTRAVENOUS | Status: AC
  Administered 2012-02-08: 500 mL via INTRAVENOUS

## 2012-02-08 MED ORDER — ONDANSETRON HCL 4 MG/2ML IJ SOLN
4.0000 mg | Freq: Once | INTRAMUSCULAR | Status: AC
  Administered 2012-02-08: 4 mg via INTRAVENOUS
  Filled 2012-02-08: qty 2

## 2012-02-08 MED ORDER — FAMOTIDINE IN NACL 20-0.9 MG/50ML-% IV SOLN
20.0000 mg | Freq: Once | INTRAVENOUS | Status: AC
  Administered 2012-02-08: 20 mg via INTRAVENOUS
  Filled 2012-02-08: qty 50

## 2012-02-08 NOTE — ED Notes (Signed)
PT TRANSPORTED TO ULTRASOUND

## 2012-02-08 NOTE — ED Provider Notes (Signed)
History     CSN: [id number]  Arrival date & time 02/08/12  1850   First MD Initiated Contact with Patient 02/08/12 1853      Chief Complaint  Patient presents with  . Chest Pain  . Abdominal Pain    (Consider location/radiation/quality/duration/timing/severity/associated sxs/prior treatment) The history is provided by the patient.    46 y.o. male INAD c/o intermittent abdominal pain x3 days sharp 10/10 at worst, exacerbated by eating, radiating to the chest. He endorses nausea with out vomiting. Diarrhea x2 days. Patient reports subjective fever has been taking Bayer aspirin with minimal relief of pain. Does not feel like prior MI. Denies SOB. H/o gastric ulcer. Denies bloody stool. Patient was given nitroglycerin and full dose aspirin and route with reduction of pain. Patient had stent placed in April and has chronic bradycardia. Discharged from [facility] for EtOH and Crack x4 days ago.   Past Medical History  Diagnosis Date  . Chest pain, mid sternal   . CAD in native artery     a. Nonobstructive cath 11/2007;  b. Presented with ST elevation - Nonobstructive cath 08/2011  . GERD (gastroesophageal reflux disease)   . Tobacco abuse   . Marijuana abuse     a. uses ~ 1x /wk or less  . History of cocaine abuse     a. quit ? 2009  . History of ETOH abuse     a. drinks 2 "40's" / wk  . Bradycardia     a. asymptomatic  . Bipolar disorder   . Syncope     a. 12/2010 - presumed to be vasovagal  . Abnormal ECG     a. early repolarization  . Coronary artery disease     History reviewed. No pertinent past surgical history.  No family history on file.  History  Substance Use Topics  . Smoking status: Current Everyday Smoker -- 0.5 packs/day for 20 years    Types: Cigarettes  . Smokeless tobacco: Not on file   Comment: Smokes about 3 cigarettes/day  . Alcohol Use: Yes     4-5 40oz/day      Review of Systems  Constitutional: Positive for fever.  Respiratory: Negative  for shortness of breath.   Cardiovascular: Positive for chest pain.  Gastrointestinal: Positive for nausea, abdominal pain and diarrhea. Negative for vomiting.  All other systems reviewed and are negative.    Allergies  Aspirin and Tylenol  Home Medications   Current Outpatient Rx  Name Route Sig Dispense Refill  . ASPIRIN EC 81 MG PO TBEC Oral Take 81 mg by mouth daily.    Marland Kitchen FAMOTIDINE 20 MG PO TABS Oral Take 20 mg by mouth 2 (two) times daily.    Marland Kitchen FLUOXETINE HCL 20 MG PO CAPS Oral Take 20 mg by mouth daily.    Marland Kitchen GABAPENTIN 300 MG PO CAPS Oral Take 300 mg by mouth 3 (three) times daily. For anxiety and pain.    Marland Kitchen PRAVASTATIN SODIUM 20 MG PO TABS Oral Take 20 mg by mouth at bedtime.    Marland Kitchen PROMETHAZINE HCL 25 MG PO TABS Oral Take 25 mg by mouth every 6 (six) hours as needed. For nausea/vomiting    . TRAZODONE HCL 150 MG PO TABS Oral Take 150 mg by mouth at bedtime.      BP 111/66  Pulse 56  Temp 98 F (36.7 C) (Oral)  Resp 17  SpO2 100%  Physical Exam  Nursing note and vitals reviewed. Constitutional: He is oriented  to person, place, and time. He appears well-developed and well-nourished. No distress.  HENT:  Head: Normocephalic and atraumatic.  Eyes: Conjunctivae and EOM are normal. Pupils are equal, round, and reactive to light.  Neck: Normal range of motion.  Cardiovascular: Normal rate, regular rhythm, normal heart sounds and intact distal pulses.  Exam reveals no gallop and no friction rub.   No murmur heard. Pulmonary/Chest: Effort normal and breath sounds normal. No respiratory distress. He has no wheezes. He has no rales. He exhibits no tenderness.  Abdominal: Soft. Bowel sounds are normal. He exhibits no distension and no mass. There is tenderness. There is no rebound and no guarding.        diffusely tender to palpation of the abdomen there are no peritoneal signs he has good bowel sounds.  Musculoskeletal: Normal range of motion.  Neurological: He is alert and  oriented to person, place, and time.  Skin: Skin is warm.  Psychiatric: He has a normal mood and affect.    ED Course  Procedures (including critical care time)  Labs Reviewed  COMPREHENSIVE METABOLIC PANEL - Abnormal; Notable for the following:    Potassium 3.3 (*)     Glucose, Bld 125 (*)     GFR calc non Af Amer 77 (*)     GFR calc Af Amer 89 (*)     All other components within normal limits  URINALYSIS, ROUTINE W REFLEX MICROSCOPIC - Abnormal; Notable for the following:    Urobilinogen, UA 4.0 (*)     Leukocytes, UA SMALL (*)     All other components within normal limits  URINE RAPID DRUG SCREEN (HOSP PERFORMED) - Abnormal; Notable for the following:    Tetrahydrocannabinol POSITIVE (*)     All other components within normal limits  CBC WITH DIFFERENTIAL  LIPASE, BLOOD  POCT I-STAT TROPONIN I  URINE MICROSCOPIC-ADD ON  POCT I-STAT TROPONIN I   US Abdomen Complete  02/08/2012  *RADIOLOGY REPORT*  Clinical Data:  Chest pain and abdominal pain.  COMPLETE ABDOMINAL ULTRASOUND  Comparison:  03/12/2010 ultrasound.  Findings:  Gallbladder:  No gallstones, gallbladder wall thickening, or pericholecystic fluid.  Decubitus views were not able to be obtained this patient was called back to emergency room STAT.  Common bile duct:  2.6 mm.  Liver:  No focal lesion identified.  Within normal limits in parenchymal echogenicity.  IVC:  Dilated.  Pancreas:  Portions not well delineated.  The portions which are visualized appear unremarkable.  Spleen:  6.1 cm.  4.6 mm echogenic focus may represent a small calcification.  Right Kidney:  10.1 cm. No hydronephrosis or renal mass.  Left Kidney:  And 10.5 cm. No hydronephrosis or renal mass.  Abdominal aorta:  Atherosclerotic type changes suspected without aneurysmal dilation.  IMPRESSION: Prominent inferior vena cava.  Decubitus views of gallbladder not obtained as discussed above.   Original Report Authenticated By: Fuller Canada, M.D.    Dg Chest  Portable 1 View  02/08/2012  *RADIOLOGY REPORT*  Clinical Data: Chest pain, severe mid abdominal pain  PORTABLE CHEST - 1 VIEW  Comparison: 12/13/2011; 09/19/2011; 08/16/2011; 06/09/2008  Findings: Grossly unchanged cardiac silhouette and mediastinal contours.  No focal parenchymal opacities.  No pleural effusion or pneumothorax.  Unchanged bones including asymmetric increased sclerosis of the anterior inferior aspect of the right first rib.  IMPRESSION: No acute cardiopulmonary disease.   Original Report Authenticated By: Waynard Reeds, M.D.      Date: 02/08/2012  Rate: 45  Rhythm: sinus bradycardia  QRS Axis: normal  Intervals: normal  ST/T Wave abnormalities: ST elevations diffusely  Conduction Disutrbances:none  Narrative Interpretation:   Old EKG Reviewed: changes noted   Date: 02/08/2012  Rate: 44  Rhythm: sinus bradycardia  QRS Axis: normal  Intervals: normal  ST/T Wave abnormalities: nonspecific ST/T changes  Conduction Disutrbances:none  Narrative Interpretation:   Old EKG Reviewed: none available    1. Abdominal pain       MDM  46 y.o. male complaining of epigastric abdominal pain worse after eating for 3 days.  GI cocktail and IV Pepcid produced no improvement in pain.  Initial EKG shows diffuse ST elevation.  Pt brought back from [place] so a repaet EKG could be obtained.   Repeat EKG did not show the diffuse ST elevations sen on first EKG  Abdominal US shows no abnormality except for a prominent inferior vena cava. Chest x-ray is normal.   10:49 PM Pt resting comfortably INAD, Abdominal exam is benign and he reports no tenderness to deep palpation.   Second troponin is negative.  Imaging, blood work are reassuring. Patient shows significant subjective improvement in pain. I will discharge him with tramadol and Zofran he will follow with his primary care doctor in the next 24-48 hours.   New Prescriptions   ONDANSETRON (ZOFRAN) 4 MG TABLET    Take 1 tablet  (4 mg total) by mouth every 8 (eight) hours as needed for nausea.   TRAMADOL (ULTRAM) 50 MG TABLET    Take 1 tablet (50 mg total) by mouth every 6 (six) hours as needed for pain.                 Wynetta Emery, PA-C 02/08/12 2355

## 2012-02-08 NOTE — ED Notes (Signed)
EKG GIVEN TO EDP DR. NANAVATI.

## 2012-02-08 NOTE — ED Notes (Signed)
Per ems- Pt reporting onset of substernal cp and mid upper abdominal pain x 3 days. Pain is intermittent. Abdominal pain is worse with eating. C/o nausea, diarrhea x 2 days. Pain is non-radiating, worse with deep inspiration. Initially c/o 8/10 cp relieved with 2 nitro. Given 324 aspirin. C/o 8/10 abdominal pain at present. EKG SB HR 50, BP 124/78. Stent placed in April. Skin warm and dry although report diaphoresis initially. A x 4

## 2012-02-08 NOTE — ED Notes (Signed)
PA notified of neg troponin.  Family alerted of D/C.

## 2012-02-08 NOTE — ED Notes (Signed)
N. PISCIOTTA  PA ADVISED NURSE TO CALL ULTRASOUND TO TRANSPORT PT. BACK TO HIS ROOM ASAP.

## 2012-02-09 NOTE — ED Provider Notes (Signed)
Medical screening examination/treatment/procedure(s) were conducted as a shared visit with non-physician practitioner(s) and myself.  I personally evaluated the patient during the encounter.  Pt comes in with epigastric abd pain, shooting upto midsternum. Non radiating. No cocaine in uds, and patient hasnt use cocaine recently. The pain has been constant x 3 days - has hx of GERD, and feels the pain is similar. On cardiac side, since this pain is so atypical, tropsx 2 with normal ekg appears suffice in light of cocaine negative. [place] negative as well. D/C - likely gastritis.   Derwood Kaplan, MD 02/09/12 [phone]

## 2012-03-19 ENCOUNTER — Inpatient Hospital Stay (HOSPITAL_COMMUNITY)
Admission: EM | Admit: 2012-03-19 | Discharge: 2012-03-22 | DRG: 440 | Disposition: A | Discharge status: Home / Self Care | Payer: Medicaid Other | Attending: Internal Medicine | Admitting: Internal Medicine

## 2012-03-19 ENCOUNTER — Encounter (HOSPITAL_COMMUNITY): Payer: Self-pay | Admitting: Emergency Medicine

## 2012-03-19 DIAGNOSIS — F17200 Nicotine dependence, unspecified, uncomplicated: Secondary | ICD-10-CM | POA: Diagnosis present

## 2012-03-19 DIAGNOSIS — F319 Bipolar disorder, unspecified: Secondary | ICD-10-CM | POA: Diagnosis present

## 2012-03-19 DIAGNOSIS — Z720 Tobacco use: Secondary | ICD-10-CM

## 2012-03-19 DIAGNOSIS — Z9861 Coronary angioplasty status: Secondary | ICD-10-CM

## 2012-03-19 DIAGNOSIS — R110 Nausea: Secondary | ICD-10-CM

## 2012-03-19 DIAGNOSIS — F1010 Alcohol abuse, uncomplicated: Secondary | ICD-10-CM | POA: Diagnosis present

## 2012-03-19 DIAGNOSIS — R0789 Other chest pain: Secondary | ICD-10-CM

## 2012-03-19 DIAGNOSIS — F1411 Cocaine abuse, in remission: Secondary | ICD-10-CM

## 2012-03-19 DIAGNOSIS — I2510 Atherosclerotic heart disease of native coronary artery without angina pectoris: Secondary | ICD-10-CM | POA: Diagnosis present

## 2012-03-19 DIAGNOSIS — R001 Bradycardia, unspecified: Secondary | ICD-10-CM

## 2012-03-19 DIAGNOSIS — R55 Syncope and collapse: Secondary | ICD-10-CM

## 2012-03-19 DIAGNOSIS — K219 Gastro-esophageal reflux disease without esophagitis: Secondary | ICD-10-CM | POA: Diagnosis present

## 2012-03-19 DIAGNOSIS — R109 Unspecified abdominal pain: Secondary | ICD-10-CM

## 2012-03-19 DIAGNOSIS — F1210 Cannabis abuse, uncomplicated: Secondary | ICD-10-CM

## 2012-03-19 DIAGNOSIS — R9431 Abnormal electrocardiogram [ECG] [EKG]: Secondary | ICD-10-CM

## 2012-03-19 DIAGNOSIS — F1011 Alcohol abuse, in remission: Secondary | ICD-10-CM

## 2012-03-19 DIAGNOSIS — F1910 Other psychoactive substance abuse, uncomplicated: Secondary | ICD-10-CM | POA: Diagnosis present

## 2012-03-19 DIAGNOSIS — I498 Other specified cardiac arrhythmias: Secondary | ICD-10-CM | POA: Diagnosis present

## 2012-03-19 DIAGNOSIS — F1020 Alcohol dependence, uncomplicated: Secondary | ICD-10-CM

## 2012-03-19 DIAGNOSIS — F1410 Cocaine abuse, uncomplicated: Secondary | ICD-10-CM | POA: Diagnosis present

## 2012-03-19 DIAGNOSIS — F1994 Other psychoactive substance use, unspecified with psychoactive substance-induced mood disorder: Secondary | ICD-10-CM | POA: Diagnosis present

## 2012-03-19 DIAGNOSIS — K859 Acute pancreatitis without necrosis or infection, unspecified: Principal | ICD-10-CM | POA: Diagnosis present

## 2012-03-19 LAB — LIPASE, BLOOD: Lipase: 1661 U/L — ABNORMAL HIGH (ref 11–59)

## 2012-03-19 LAB — CBC
HCT: 42.1 % (ref 39.0–52.0)
Hemoglobin: 15.2 g/dL (ref 13.0–17.0)
MCH: 31.7 pg (ref 26.0–34.0)
MCHC: 36.1 g/dL — ABNORMAL HIGH (ref 30.0–36.0)
MCV: 87.7 fL (ref 78.0–100.0)
RDW: 13 % (ref 11.5–15.5)

## 2012-03-19 LAB — BASIC METABOLIC PANEL
BUN: 12 mg/dL (ref 6–23)
Calcium: 9.9 mg/dL (ref 8.4–10.5)
Creatinine, Ser: 1.18 mg/dL (ref 0.50–1.35)
GFR calc Af Amer: 83 mL/min — ABNORMAL LOW (ref >90)
GFR calc non Af Amer: 72 mL/min — ABNORMAL LOW (ref >90)
Glucose, Bld: 94 mg/dL (ref 70–99)
Potassium: 3.9 mEq/L (ref 3.5–5.1)

## 2012-03-19 MED ORDER — HYDROCODONE-ACETAMINOPHEN 5-325 MG PO TABS
2.0000 | ORAL_TABLET | Freq: Once | ORAL | Status: AC
  Administered 2012-03-19: 2 via ORAL
  Filled 2012-03-19: qty 2

## 2012-03-19 MED ORDER — MORPHINE SULFATE 4 MG/ML IJ SOLN
4.0000 mg | Freq: Once | INTRAMUSCULAR | Status: AC
  Administered 2012-03-19: 4 mg via INTRAVENOUS
  Filled 2012-03-19: qty 1

## 2012-03-19 MED ORDER — SODIUM CHLORIDE 0.9 % IV SOLN
Freq: Once | INTRAVENOUS | Status: AC
  Administered 2012-03-19: 20:00:00 via INTRAVENOUS

## 2012-03-19 MED ORDER — ONDANSETRON HCL 4 MG/2ML IJ SOLN
4.0000 mg | Freq: Once | INTRAMUSCULAR | Status: AC
  Administered 2012-03-19: 4 mg via INTRAVENOUS
  Filled 2012-03-19: qty 2

## 2012-03-19 NOTE — ED Provider Notes (Addendum)
History     CSN: [id number]  Arrival date & time 03/19/12  1759   First MD Initiated Contact with Patient 03/19/12 1856      Chief Complaint  Patient presents with  . Abdominal Pain    (Consider location/radiation/quality/duration/timing/severity/associated sxs/prior treatment) HPI Comments: This is a 46 year old male, who presents to the emergency department with chief complaint of abdominal pain. Patient states that this pain has been ongoing for the last month. He states that 4 days ago he also began having diarrhea. States that he was diagnosed with an ulcer one year ago. He is been seen several times in the past for this same abdominal pain. He tells me that tonight it is no different than his previous episodes. He is in moderate-severe discomfort.  The history is provided by the patient. No language interpreter was used.    Past Medical History  Diagnosis Date  . Chest pain, mid sternal   . CAD in native artery     a. Nonobstructive cath 11/2007;  b. Presented with ST elevation - Nonobstructive cath 08/2011  . GERD (gastroesophageal reflux disease)   . Tobacco abuse   . Marijuana abuse     a. uses ~ 1x /wk or less  . History of cocaine abuse     a. quit ? 2009  . History of ETOH abuse     a. drinks 2 "40's" / wk  . Bradycardia     a. asymptomatic  . Bipolar disorder   . Syncope     a. 12/2010 - presumed to be vasovagal  . Abnormal ECG     a. early repolarization  . Coronary artery disease     Past Surgical History  Procedure Date  . Coronary angioplasty with stent placement     No family history on file.  History  Substance Use Topics  . Smoking status: Current Every Day Smoker -- 0.5 packs/day for 20 years    Types: Cigarettes  . Smokeless tobacco: Not on file   Comment: Smokes about 3 cigarettes/day  . Alcohol Use: Yes     4-5 40oz/day      Review of Systems  Constitutional: Negative for fever.  HENT: Negative for rhinorrhea.   Eyes: Negative for  visual disturbance.  Respiratory: Negative for chest tightness, shortness of breath and wheezing.   Cardiovascular: Positive for chest pain.  Gastrointestinal: Positive for nausea, vomiting, abdominal pain and diarrhea.  Genitourinary: Negative for dysuria.  Musculoskeletal: Negative for back pain.  Neurological: Negative for weakness.  Psychiatric/Behavioral: The patient is not nervous/anxious.   All other systems reviewed and are negative.    Allergies  Aspirin and Tylenol  Home Medications   Current Outpatient Rx  Name Route Sig Dispense Refill  . ASPIRIN EC 81 MG PO TBEC Oral Take 81 mg by mouth daily.    Marland Kitchen FAMOTIDINE 20 MG PO TABS Oral Take 20 mg by mouth 2 (two) times daily.    Marland Kitchen FLUOXETINE HCL 20 MG PO CAPS Oral Take 20 mg by mouth daily.    Marland Kitchen GABAPENTIN 300 MG PO CAPS Oral Take 300 mg by mouth 3 (three) times daily. For anxiety and pain.     Marland Kitchen PRAVASTATIN SODIUM 20 MG PO TABS Oral Take 20 mg by mouth at bedtime.    Marland Kitchen PROMETHAZINE HCL 25 MG PO TABS Oral Take 25 mg by mouth every 6 (six) hours as needed. For nausea/vomiting    . TRAZODONE HCL 150 MG PO TABS Oral Take 150  mg by mouth at bedtime.      BP 122/87  Pulse 48  Temp 98 F (36.7 C) (Oral)  Resp 18  SpO2 100%  Physical Exam  Nursing note and vitals reviewed. Constitutional: He is oriented to person, place, and time. He appears well-developed and well-nourished.  HENT:  Head: Normocephalic and atraumatic.  Eyes: Conjunctivae normal and EOM are normal. Pupils are equal, round, and reactive to light.  Neck: Normal range of motion. Neck supple.  Cardiovascular: Normal rate, regular rhythm and normal heart sounds.   Pulmonary/Chest: Effort normal and breath sounds normal.  Abdominal: Soft. Bowel sounds are normal. He exhibits no distension and no mass. There is tenderness. There is guarding. There is no rebound.  Musculoskeletal: Normal range of motion.  Neurological: He is alert and oriented to person, place,  and time.  Skin: Skin is warm and dry.  Psychiatric: He has a normal mood and affect. His behavior is normal. Judgment and thought content normal.    ED Course  Procedures (including critical care time)  Labs Reviewed  CBC - Abnormal; Notable for the following:    MCHC 36.1 (*)     All other components within normal limits  BASIC METABOLIC PANEL - Abnormal; Notable for the following:    GFR calc non Af Amer 72 (*)     GFR calc Af Amer 83 (*)     All other components within normal limits  POCT I-STAT TROPONIN I   Results for orders placed during the hospital encounter of 03/19/12  CBC      Component Value Range   WBC 5.6  4.0 - 10.5 K/uL   RBC 4.80  4.22 - 5.81 MIL/uL   Hemoglobin 15.2  13.0 - 17.0 g/dL   HCT 16.1  09.6 - 04.5 %   MCV 87.7  78.0 - 100.0 fL   MCH 31.7  26.0 - 34.0 pg   MCHC 36.1 (*) 30.0 - 36.0 g/dL   RDW 40.9  81.1 - 91.4 %   Platelets 201  150 - 400 K/uL  BASIC METABOLIC PANEL      Component Value Range   Sodium 136  135 - 145 mEq/L   Potassium 3.9  3.5 - 5.1 mEq/L   Chloride 99  96 - 112 mEq/L   CO2 24  19 - 32 mEq/L   Glucose, Bld 94  70 - 99 mg/dL   BUN 12  6 - 23 mg/dL   Creatinine, Ser 7.82  0.50 - 1.35 mg/dL   Calcium 9.9  8.4 - 95.6 mg/dL   GFR calc non Af Amer 72 (*) >90 mL/min   GFR calc Af Amer 83 (*) >90 mL/min  POCT I-STAT TROPONIN I      Component Value Range   Troponin i, poc 0.00  0.00 - 0.08 ng/mL   Comment 3               No diagnosis found.    MDM    46 year old male with abdominal pain. This patient has been discussed with Dr. Lynelle Doctor. Patient has been seen repeatedly for the same complaint. Workup today has been benign. Going to transfer the patient to CDU to await the results of the rest of his labs. If the labs are normal, disposition will be to GI followup. This patient has been signed out to Remi Haggard, FNP, will continue care from this time.   11:03 PM Nurse tells me that the patient did not ever go to  CDU and  that the lab cannot find his urine sample.  I told that nurse to obtain another sample via in and out cath.  11:37 PM Lipase is 1661, probable pancreatitis.  Will order CT abd/pelvis.  Dr. Lynelle Doctor tells me that she will have the patient admitted.  Patient will be an unassigned admission.  Patient has been seen by and discussed with Dr. Lynelle Doctor.  Roxy Horseman, PA-C 03/19/12 2156  Roxy Horseman, PA-C 03/19/12 2156  Roxy Horseman, PA-C 03/19/12 [phone]

## 2012-03-19 NOTE — ED Notes (Signed)
Pt to ED via [facility].  C/o abd and chest pain x 1 month and diarrhea x 4 days.  Pain worse when lying and wakes him up at night. Diagnosed with ulcer over 1 yr ago.

## 2012-03-19 NOTE — ED Provider Notes (Addendum)
Patient reports epigastric abdominal pain for the past month. He states the pain is burning and sometimes radiates up his chest and his throat. He's had chronic nausea. Patient reports he used to drink liquor however he quit drinking liquor a year ago, he states now he only drinks alcohol about twice a week. He has not had any alcohol recently. He states he's never had this before. He also states she's never had endoscopy before.  PCP [facility]  Patient is then an underweight, he has epigastric tenderness.   23:45 Dr Toniann Fail admit to tele, team 10   Results for orders placed during the hospital encounter of 03/19/12  CBC      Component Value Range   WBC 5.6  4.0 - 10.5 K/uL   RBC 4.80  4.22 - 5.81 MIL/uL   Hemoglobin 15.2  13.0 - 17.0 g/dL   HCT 30.8  65.7 - 84.6 %   MCV 87.7  78.0 - 100.0 fL   MCH 31.7  26.0 - 34.0 pg   MCHC 36.1 (*) 30.0 - 36.0 g/dL   RDW 96.2  95.2 - 84.1 %   Platelets 201  150 - 400 K/uL  BASIC METABOLIC PANEL      Component Value Range   Sodium 136  135 - 145 mEq/L   Potassium 3.9  3.5 - 5.1 mEq/L   Chloride 99  96 - 112 mEq/L   CO2 24  19 - 32 mEq/L   Glucose, Bld 94  70 - 99 mg/dL   BUN 12  6 - 23 mg/dL   Creatinine, Ser 3.24  0.50 - 1.35 mg/dL   Calcium 9.9  8.4 - 40.1 mg/dL   GFR calc non Af Amer 72 (*) >90 mL/min   GFR calc Af Amer 83 (*) >90 mL/min  POCT I-STAT TROPONIN I      Component Value Range   Troponin i, poc 0.00  0.00 - 0.08 ng/mL   Comment 3           LIPASE, BLOOD      Component Value Range   Lipase 1661 (*) 11 - 59 U/L   Laboratory interpretation all normal except very elevated lipase  CT AP pending   Plan admission   Ward Givens, MD 03/19/12 2200   Medical screening examination/treatment/procedure(s) were conducted as a shared visit with non-physician practitioner(s) and myself.  I personally evaluated the patient during the encounter  Devoria Albe, MD, Franz Dell, MD 03/19/12 2350  Ward Givens,  MD 03/19/12 2351

## 2012-03-19 NOTE — ED Notes (Signed)
Pt on hands and knees in bed, states "the pain is worse"

## 2012-03-19 NOTE — ED Provider Notes (Signed)
See prior note   Ward Givens, MD 03/19/12 2351

## 2012-03-19 NOTE — ED Notes (Signed)
Denies sob

## 2012-03-20 ENCOUNTER — Inpatient Hospital Stay (HOSPITAL_COMMUNITY): Payer: Medicaid Other

## 2012-03-20 ENCOUNTER — Encounter (HOSPITAL_COMMUNITY): Payer: Self-pay | Admitting: Internal Medicine

## 2012-03-20 DIAGNOSIS — K859 Acute pancreatitis without necrosis or infection, unspecified: Principal | ICD-10-CM

## 2012-03-20 DIAGNOSIS — F1910 Other psychoactive substance abuse, uncomplicated: Secondary | ICD-10-CM

## 2012-03-20 LAB — CBC WITH DIFFERENTIAL/PLATELET

## 2012-03-20 LAB — CBC
MCH: 30.7 pg (ref 26.0–34.0)
MCHC: 34.9 g/dL (ref 30.0–36.0)
MCV: 87.9 fL (ref 78.0–100.0)
Platelets: 157 10*3/uL (ref 150–400)
RDW: 12.9 % (ref 11.5–15.5)

## 2012-03-20 LAB — LIPID PANEL
Cholesterol: 165 mg/dL (ref 0–200)
HDL: 71 mg/dL (ref >39)
Total CHOL/HDL Ratio: 2.3 RATIO
Triglycerides: 97 mg/dL (ref <150)
VLDL: 19 mg/dL (ref 0–40)

## 2012-03-20 LAB — COMPREHENSIVE METABOLIC PANEL
Albumin: 3.7 g/dL (ref 3.5–5.2)
BUN: 10 mg/dL (ref 6–23)
Creatinine, Ser: 1.21 mg/dL (ref 0.50–1.35)
Total Bilirubin: 0.7 mg/dL (ref 0.3–1.2)
Total Protein: 6.8 g/dL (ref 6.0–8.3)

## 2012-03-20 LAB — C-REACTIVE PROTEIN: CRP: <0.5 mg/dL — ABNORMAL LOW (ref <0.60)

## 2012-03-20 LAB — RAPID URINE DRUG SCREEN, HOSP PERFORMED
Amphetamines: NOT DETECTED
Opiates: POSITIVE — AB

## 2012-03-20 LAB — DIFFERENTIAL
Basophils Relative: 0 % (ref 0–1)
Eosinophils Absolute: 0.1 10*3/uL (ref 0.0–0.7)
Eosinophils Relative: 3 % (ref 0–5)
Neutrophils Relative %: 38 % — ABNORMAL LOW (ref 43–77)

## 2012-03-20 LAB — TROPONIN I
Troponin I: <0.3 ng/mL (ref <0.30)
Troponin I: <0.3 ng/mL (ref <0.30)

## 2012-03-20 LAB — TSH: TSH: 2.919 u[IU]/mL (ref 0.350–4.500)

## 2012-03-20 LAB — LIPASE, BLOOD: Lipase: 1138 U/L — ABNORMAL HIGH (ref 11–59)

## 2012-03-20 MED ORDER — IOHEXOL 300 MG/ML  SOLN
100.0000 mL | Freq: Once | INTRAMUSCULAR | Status: AC | PRN
  Administered 2012-03-20: 100 mL via INTRAVENOUS

## 2012-03-20 MED ORDER — LORAZEPAM 2 MG/ML IJ SOLN: 1.0000 mg | Freq: Four times a day (QID) | INTRAMUSCULAR | Status: DC | PRN

## 2012-03-20 MED ORDER — THIAMINE HCL 100 MG/ML IJ SOLN
100.0000 mg | Freq: Every day | INTRAMUSCULAR | Status: DC
  Administered 2012-03-20 – 2012-03-21 (×2): 100 mg via INTRAVENOUS
  Filled 2012-03-20 (×2): qty 1

## 2012-03-20 MED ORDER — FOLIC ACID 1 MG PO TABS
1.0000 mg | ORAL_TABLET | Freq: Every day | ORAL | Status: DC
  Filled 2012-03-20: qty 1

## 2012-03-20 MED ORDER — CHLORHEXIDINE GLUCONATE 0.12 % MT SOLN
15.0000 mL | Freq: Two times a day (BID) | OROMUCOSAL | Status: DC
  Administered 2012-03-20 – 2012-03-22 (×4): 15 mL via OROMUCOSAL
  Filled 2012-03-20 (×7): qty 15

## 2012-03-20 MED ORDER — SODIUM CHLORIDE 0.9 % IJ SOLN
3.0000 mL | Freq: Two times a day (BID) | INTRAMUSCULAR | Status: DC
  Administered 2012-03-20: 3 mL via INTRAVENOUS

## 2012-03-20 MED ORDER — LORAZEPAM 1 MG PO TABS: 1.0000 mg | ORAL_TABLET | Freq: Four times a day (QID) | ORAL | Status: DC | PRN

## 2012-03-20 MED ORDER — ONDANSETRON HCL 4 MG PO TABS: 4.0000 mg | ORAL_TABLET | Freq: Four times a day (QID) | ORAL | Status: DC | PRN

## 2012-03-20 MED ORDER — VITAMIN B-1 100 MG PO TABS
100.0000 mg | ORAL_TABLET | Freq: Every day | ORAL | Status: DC
  Filled 2012-03-20: qty 1

## 2012-03-20 MED ORDER — GABAPENTIN 300 MG PO CAPS
300.0000 mg | ORAL_CAPSULE | Freq: Three times a day (TID) | ORAL | Status: DC
  Filled 2012-03-20 (×3): qty 1

## 2012-03-20 MED ORDER — HYDROMORPHONE HCL PF 1 MG/ML IJ SOLN: 1.0000 mg | INTRAMUSCULAR | Status: DC | PRN

## 2012-03-20 MED ORDER — MORPHINE SULFATE 4 MG/ML IJ SOLN
4.0000 mg | INTRAMUSCULAR | Status: DC | PRN
  Administered 2012-03-20: 1 mg via INTRAVENOUS
  Filled 2012-03-20: qty 1

## 2012-03-20 MED ORDER — SODIUM CHLORIDE 0.9 % IV SOLN
INTRAVENOUS | Status: DC
  Administered 2012-03-20: 03:00:00 via INTRAVENOUS

## 2012-03-20 MED ORDER — SODIUM CHLORIDE 0.9 % IV SOLN: INTRAVENOUS | Status: DC

## 2012-03-20 MED ORDER — SODIUM CHLORIDE 0.9 % IV SOLN
INTRAVENOUS | Status: AC
  Administered 2012-03-20 (×5): 1000 mL via INTRAVENOUS

## 2012-03-20 MED ORDER — ONDANSETRON HCL 4 MG/2ML IJ SOLN: 4.0000 mg | Freq: Four times a day (QID) | INTRAMUSCULAR | Status: AC | PRN

## 2012-03-20 MED ORDER — BIOTENE DRY MOUTH MT LIQD
15.0000 mL | Freq: Two times a day (BID) | OROMUCOSAL | Status: DC
  Administered 2012-03-20 – 2012-03-22 (×4): 15 mL via OROMUCOSAL

## 2012-03-20 MED ORDER — HYDROMORPHONE HCL PF 1 MG/ML IJ SOLN
0.5000 mg | INTRAMUSCULAR | Status: DC | PRN
  Administered 2012-03-20: 0.5 mg via INTRAVENOUS
  Administered 2012-03-21: 1 mg via INTRAVENOUS
  Filled 2012-03-20 (×2): qty 1

## 2012-03-20 MED ORDER — ADULT MULTIVITAMIN W/MINERALS CH
1.0000 | ORAL_TABLET | Freq: Every day | ORAL | Status: DC
  Administered 2012-03-20 – 2012-03-22 (×2): 1 via ORAL
  Filled 2012-03-20 (×3): qty 1

## 2012-03-20 MED ORDER — HYDROMORPHONE HCL PF 1 MG/ML IJ SOLN
1.0000 mg | INTRAMUSCULAR | Status: DC | PRN
  Administered 2012-03-20: 1 mg via INTRAVENOUS
  Filled 2012-03-20: qty 1

## 2012-03-20 MED ORDER — ONDANSETRON HCL 4 MG/2ML IJ SOLN
4.0000 mg | Freq: Four times a day (QID) | INTRAMUSCULAR | Status: DC | PRN
  Administered 2012-03-21: 4 mg via INTRAVENOUS
  Filled 2012-03-20: qty 2

## 2012-03-20 MED ORDER — TRAZODONE HCL 150 MG PO TABS
150.0000 mg | ORAL_TABLET | Freq: Every day | ORAL | Status: DC
  Administered 2012-03-20: 150 mg via ORAL
  Filled 2012-03-20 (×2): qty 1

## 2012-03-20 MED ORDER — THIAMINE HCL 100 MG/ML IJ SOLN
100.0000 mg | Freq: Every day | INTRAMUSCULAR | Status: DC
  Filled 2012-03-20: qty 1

## 2012-03-20 MED ORDER — FOLIC ACID 5 MG/ML IJ SOLN
1.0000 mg | Freq: Every day | INTRAMUSCULAR | Status: DC
  Administered 2012-03-20 – 2012-03-21 (×2): 1 mg via INTRAVENOUS
  Filled 2012-03-20 (×2): qty 0.2

## 2012-03-20 NOTE — H&P (Addendum)
[name] [name] is an 46 y.o. male.   Patient was seen and examined on March 20, 2012. PCP - [facility]. Chief Complaint: Abdominal pain. HPI: 46 year old male with history of polysubstance abuse including alcohol and cocaine and tobacco abuse presents to the ER with complaints of abdominal pain. The pain is mostly in the epigastric area. Denies any radiation. Has some nausea but has not vomited. Has had at least a couple of loose bowel stools. Patient states he has been having pain off and on for last couple of months but worsened last 24 hours. In the ER labs reveal increased lipase level. At this time patient is about to get a CT abdomen pelvis. Patient has been admitted for further management of his acute pancreatitis. Patient has been recently detoxed from polysubstance abuse in August. But still drinks alcohol off and on, usually beer. Last drink was one week ago.  Past Medical History  Diagnosis Date  . Chest pain, mid sternal   . CAD in native artery     a. Nonobstructive cath 11/2007;  b. Presented with ST elevation - Nonobstructive cath 08/2011  . GERD (gastroesophageal reflux disease)   . Tobacco abuse   . Marijuana abuse     a. uses ~ 1x /wk or less  . History of cocaine abuse     a. quit ? 2009  . History of ETOH abuse     a. drinks 2 "40's" / wk  . Bradycardia     a. asymptomatic  . Bipolar disorder   . Syncope     a. 12/2010 - presumed to be vasovagal  . Abnormal ECG     a. early repolarization  . Coronary artery disease     Past Surgical History  Procedure Date  . Coronary angioplasty with stent placement     History reviewed. No pertinent family history. Social History:  reports that he has been smoking Cigarettes.  He has a 10 pack-year smoking history. He does not have any smokeless tobacco history on file. He reports that he drinks alcohol. He reports that he uses illicit drugs (Cocaine and Marijuana).  Allergies:  Allergies  Allergen Reactions  . Aspirin     Aggravates ulcer. "Causes chest pain."  . Tylenol (Acetaminophen)     Pt reports hx of ulcers     (Not in a hospital admission)  Results for orders placed during the hospital encounter of 03/19/12 (from the past 48 hour(s))  CBC     Status: Abnormal   Collection Time   03/19/12  6:34 PM      Component Value Range Comment   WBC 5.6  4.0 - 10.5 K/uL    RBC 4.80  4.22 - 5.81 MIL/uL    Hemoglobin 15.2  13.0 - 17.0 g/dL    HCT 16.1  09.6 - 04.5 %    MCV 87.7  78.0 - 100.0 fL    MCH 31.7  26.0 - 34.0 pg    MCHC 36.1 (*) 30.0 - 36.0 g/dL    RDW 40.9  81.1 - 91.4 %    Platelets 201  150 - 400 K/uL   BASIC METABOLIC PANEL     Status: Abnormal   Collection Time   03/19/12  6:34 PM      Component Value Range Comment   Sodium 136  135 - 145 mEq/L    Potassium 3.9  3.5 - 5.1 mEq/L    Chloride 99  96 - 112 mEq/L    CO2  24  19 - 32 mEq/L    Glucose, Bld 94  70 - 99 mg/dL    BUN 12  6 - 23 mg/dL    Creatinine, Ser 9.60  0.50 - 1.35 mg/dL    Calcium 9.9  8.4 - 45.4 mg/dL    GFR calc non Af Amer 72 (*) >90 mL/min    GFR calc Af Amer 83 (*) >90 mL/min   POCT I-STAT TROPONIN I     Status: Normal   Collection Time   03/19/12  6:47 PM      Component Value Range Comment   Troponin i, poc 0.00  0.00 - 0.08 ng/mL    Comment 3            LIPASE, BLOOD     Status: Abnormal   Collection Time   03/19/12 10:16 PM      Component Value Range Comment   Lipase 1661 (*) 11 - 59 U/L    No results found.  Review of Systems  Constitutional: Negative.   HENT: Negative.   Eyes: Negative.   Respiratory: Negative.   Cardiovascular: Negative.   Gastrointestinal: Positive for nausea, abdominal pain and diarrhea.  Genitourinary: Negative.   Musculoskeletal: Negative.   Skin: Negative.   Neurological: Negative.   Endo/Heme/Allergies: Negative.   Psychiatric/Behavioral: Negative.     Blood pressure 124/85, pulse 47, temperature 98 F (36.7 C), temperature source Oral, resp. rate 24, SpO2  100.00%. Physical Exam  Constitutional: He is oriented to person, place, and time. He appears well-developed and well-nourished. No distress.  HENT:  Head: Normocephalic and atraumatic.  Right Ear: External ear normal.  Left Ear: External ear normal.  Nose: Nose normal.  Mouth/Throat: Oropharynx is clear and moist. No oropharyngeal exudate.  Eyes: Conjunctivae normal are normal. Pupils are equal, round, and reactive to light. Right eye exhibits no discharge. Left eye exhibits no discharge. No scleral icterus.  Neck: Normal range of motion. Neck supple.  Cardiovascular:       Sinus bradycardia.  Respiratory: Effort normal and breath sounds normal. No respiratory distress. He has no wheezes. He has no rales.  GI: Soft. Bowel sounds are normal. He exhibits no distension. There is no tenderness. There is no rebound and no guarding.  Musculoskeletal: Normal range of motion. He exhibits no edema and no tenderness.  Neurological: He is alert and oriented to person, place, and time.       Moves all extremities.  Skin: Skin is warm and dry. He is not diaphoretic.     Assessment/Plan #1. Acute pancreatitis - most likely alcohol induced. CT abdomen and pelvis is pending. In addition we'll also check fasting lipid panel and LFT of. Continue with aggressive hydration and pain relief medication. Closely follow intake output. Patient will be kept n.p.o. at this time. #2. Polysubstance abuse - patient states last alcohol drink he had was a week ago. States he has not used cocaine since his detox 2 months ago. Advised to quit smoking. At this time I have placed patient on when necessary IV Ativan if needed. Thiamine. #3. Sinus bradycardia - probably related to pain medications. Patient's EKG shows early repolarization changes. Closely observe in telemetry. Patient did have a cardiac catheter in March of this year which was unremarkable. Check TSH. #4. History of substance related mood disorder.  CODE  STATUS - full code.  Anatole Apollo N. 03/20/2012, 1:03 AM

## 2012-03-20 NOTE — Progress Notes (Signed)
Patient ID: [name] [name], male   DOB: Apr 03, 1966, 46 y.o.   MRN: [id number] [facility] PROGRESS NOTE  [name] [name] WUJ:[id number] DOB: 09-10-1965 DOA: 03/19/2012 PCP: Lehman Prom, NP  Assessment/Plan: Principal Problem:  *Acute pancreatitis Active Problems:  Substance induced mood disorder  Polysubstance abuse    Acute pancreatitis - most likely alcohol induced. He also uses tobacco. CT abdomen and pelvis shows only borderline pancreatic duct dilation. Lipids are not excessively elevated and he does not appear to be on medications that are likely to cause pancreatitis. Continue with aggressive hydration and pain relief medication. Closely follow intake output. Patient will be kept n.p.o. at this time.   Polysubstance abuse - patient states last alcohol drink he had was a week ago. States he has not used cocaine since his detox 2 months ago. UDS is positive for opiates however he has no opiates listed on his home meds.  Advised to quit smoking. The  patient is placed on PRN IV Ativan if needed. Thiamine.  I will consult social work for counseling.   Sinus bradycardia - Asymptomatic. Patient's EKG shows early repolarization changes. Closely observe in telemetry. Patient did have a cardiac catheter in March of this year which was unremarkable. TSH WNL.  If he becomes symptomatic or drops below 35 bpm we will call cardiology.  Of note the patient's pulse rate during his hospitalization in 08/2011 was approx 45 bpm  History of substance related mood disorder.  Patient's psychiatric status appears stable.  I will consult social work for counseling.   Code Status: full code Family Communication: WIth patient he is alert and orientated Disposition Plan:    Inpatient.  to home when ready  Brief narrative: 46 year old male with history of polysubstance abuse including alcohol and cocaine and tobacco abuse presents to the ER with complaints of abdominal pain. The pain is mostly in the  epigastric area. Denies any radiation. Has some nausea but has not vomited. Has had at least a couple of loose bowel stools. Patient states he has been having pain off and on for last couple of months but worsened last 24 hours. In the ER labs reveal increased lipase level. At this time patient is about to get a CT abdomen pelvis. Patient has been admitted for further management of his acute pancreatitis. Patient has been recently detoxed from polysubstance abuse in August. But still drinks alcohol off and on, usually beer. Last drink was one week ago.    HPI/Subjective: Patient complains of continued epigastric pain.  Objective: Filed Vitals:   03/20/12 0202 03/20/12 0506 03/20/12 0600 03/20/12 1429  BP: 124/79 100/59 100/59 97/53  Pulse: 44 42 42 45  Temp:  97.4 F (36.3 C)  97.9 F (36.6 C)  TempSrc:  Oral  Oral  Resp:  18  18  Height:      Weight:      SpO2:  97%  93%    Intake/Output Summary (Last 24 hours) at 03/20/12 1433 Last data filed at 03/20/12 0900  Gross per 24 hour  Intake      0 ml  Output    700 ml  Net   -700 ml    Exam:   General:  A&O, NAD  Cardiovascular: RRR, No M/R/G, no Lower Extremity Edema. Heart sounds are difficult to hear.  Respiratory: CTA, No increased work of breathing  Abdomen: Soft, mildly tender to palpation in the epigastrum, ND, positive bowel sounds, no obvious masses  Neurological:  Cranial Nerves 2 -  12 are grossly intact, exam non focal  Psychiatric  Skin:  No rashes, bruises, or lesions  Data Reviewed: Basic Metabolic Panel:  Lab 03/20/12 1610 03/19/12 1834  NA 133* 136  K 3.7 3.9  CL 100 99  CO2 23 24  GLUCOSE 80 94  BUN 10 12  CREATININE 1.21 1.18  CALCIUM 9.2 9.9  MG -- --  PHOS -- --   Liver Function Tests:  Lab 03/20/12 0222  AST 18  ALT 12  ALKPHOS 62  BILITOT 0.7  PROT 6.8  ALBUMIN 3.7    Lab 03/20/12 0222 03/19/12 2216  LIPASE 1138* 1661*  AMYLASE -- --   CBC:  Lab 03/20/12 0700 03/20/12  0222 03/19/12 1834  WBC 4.1 DUPLICATE 5.6  NEUTROABS 1.6* PENDING --  HGB 13.0 DUPLICATE 15.2  HCT 37.2* DUPLICATE 42.1  MCV 87.9 DUPLICATE 87.7  PLT 157 DUPLICATE 201   Cardiac Enzymes:  Lab 03/20/12 0828 03/20/12 0213  CKTOTAL -- --  CKMB -- --  CKMBINDEX -- --  TROPONINI <0.30 <0.30   BNP (last 3 results)  Basename 09/19/11 0325  PROBNP 34.9   studies: Ct Abdomen Pelvis W Contrast  03/20/2012  *RADIOLOGY REPORT*  Clinical Data: Elevated lipase.  Probable pancreatitis.  Abdominal pain.  History of ethanol, cocaine, marijuana abuse.  CT ABDOMEN AND PELVIS WITH CONTRAST  Technique:  Multidetector CT imaging of the abdomen and pelvis was performed following the standard protocol during bolus administration of intravenous contrast.  Contrast: OMNIPAQUE IOHEXOL 300 MG/ML  SOLN  Comparison: ultrasound abdomen 02/08/2012.  No prior CT.  Findings: Left lower lobe pulmonary opacity which measures 1.7 cm on image 10.  Dependent.  Normal heart size without pericardial or pleural effusion. Hemangioma in the right lobe of the liver at 1.7 cm on image 24. Normal spleen.  The stomach is borderline dilated, without cause identified.  The pancreatic duct is upper normal.  No evidence of acute pancreatitis nor peripancreatic fluid collection.  Normal gallbladder, without biliary ductal dilatation.  Normal adrenal glands and kidneys.  No retroperitoneal or retrocrural adenopathy.  Normal colon.  The appendix is not well visualized.  Possibly present on image 58.  No right lower quadrant inflammation is seen.  Normal small bowel without abdominal ascites.    No pelvic adenopathy.    Normal urinary bladder and prostate.  No significant free fluid.  No acute osseous abnormality.  IMPRESSION:  1. No acute process in the abdomen or pelvis.  No evidence of acute pancreatitis, nor its complications.  The pancreatic duct is borderline dilated, but no obstructive cause is seen. 2.  Left lung base opacity which  is favored to represent an area of atelectasis.  Correlate with any infectious symptoms to exclude infection.  A pulmonary nodule is felt less likely, given morphology.  Recommend follow-up with either abdominal or chest CT of 3 months to confirm stability or resolution of this appearance.   Original Report Authenticated By: Consuello Bossier, M.D.     Scheduled Meds:   . sodium chloride   Intravenous Once  . folic acid  1 mg Intravenous Daily  . HYDROcodone-acetaminophen  2 tablet Oral Once  . morphine  4 mg Intravenous Once  . morphine  4 mg Intravenous Once  . multivitamin with minerals  1 tablet Oral Daily  . ondansetron (ZOFRAN) IV  4 mg Intravenous Once  . sodium chloride  3 mL Intravenous Q12H  . thiamine  100 mg Intravenous Daily  . DISCONTD:  folic acid  1 mg Oral Daily  . DISCONTD: gabapentin  300 mg Oral TID  . DISCONTD: thiamine  100 mg Intravenous Daily  . DISCONTD: thiamine  100 mg Oral Daily  . DISCONTD: traZODone  150 mg Oral QHS   Continuous Infusions:   . sodium chloride 1,000 mL (03/20/12 1408)  . DISCONTD: sodium chloride 125 mL/hr at 03/20/12 0254  . DISCONTD: sodium chloride Stopped (03/20/12 0256)     Stephani Police [facility] Pager [phone]  If 7PM-7AM, please contact night-coverage [email or web address] Password [email or web address] 03/20/2012, 2:33 PM   LOS: 1 day

## 2012-03-20 NOTE — Care Management Note (Unsigned)
    Page 1 of 1   03/21/2012     4:33:57 PM   CARE MANAGEMENT NOTE 03/21/2012  Patient:  [name] [name], [name] [name]   Account Number:  [id number]  Date Initiated:  03/20/2012  Documentation initiated by:  Letha Cape  Subjective/Objective Assessment:   dx acute pancreatitis  admit- lives alone.  pta independent.     Action/Plan:   Anticipated DC Date:  03/22/2012   Anticipated DC Plan:  HOME/SELF CARE      DC Planning Services  CM consult      Choice offered to / List presented to:             Status of service:  In process, will continue to follow Medicare Important Message given?   (If response is "NO", the following Medicare IM given date fields will be blank) Date Medicare IM given:   Date Additional Medicare IM given:    Discharge Disposition:    Per UR Regulation:  Reviewed for med. necessity/level of care/duration of stay  If discussed at Long Length of Stay Meetings, dates discussed:    Comments:  03/21/12 16:25 Letha Cape RN, BSN [phone] patient still with abd pain, npo, ivf 125 decreased to 100. Patient will need to get medicaid card changed to the MD he wants to follow up with, because He used to go to [facility] and they are listed on his medicaid card.  I called [facility]uss[facility] to see if they will see patient , they state they will see this patient on 11/8 at 2:00.  03/20/12 15:26 Letha Cape RN, BSN [phone] patient lives alone, pta independent, patient has medication coverage and pt will need a bus pass at [employer], CSW aware.  Patient states he used to go to [facility] until they closed, he does see his Heart MD across the street. Patient states he is in process of getting his medicaid card updated, he will have a new MD on his medicaid care and this will be the Md he will have to see for follow up. NCM will continue to follow for dc needs.

## 2012-03-21 DIAGNOSIS — I498 Other specified cardiac arrhythmias: Secondary | ICD-10-CM

## 2012-03-21 DIAGNOSIS — R109 Unspecified abdominal pain: Secondary | ICD-10-CM

## 2012-03-21 DIAGNOSIS — F1020 Alcohol dependence, uncomplicated: Secondary | ICD-10-CM

## 2012-03-21 LAB — COMPREHENSIVE METABOLIC PANEL
ALT: 9 U/L (ref 0–53)
AST: 18 U/L (ref 0–37)
Albumin: 3.2 g/dL — ABNORMAL LOW (ref 3.5–5.2)
CO2: 21 mEq/L (ref 19–32)
Calcium: 8.5 mg/dL (ref 8.4–10.5)
Chloride: 106 mEq/L (ref 96–112)
Creatinine, Ser: 1.27 mg/dL (ref 0.50–1.35)
GFR calc non Af Amer: 66 mL/min — ABNORMAL LOW (ref >90)
Sodium: 137 mEq/L (ref 135–145)

## 2012-03-21 LAB — CBC
MCV: 88.4 fL (ref 78.0–100.0)
Platelets: 149 10*3/uL — ABNORMAL LOW (ref 150–400)
RBC: 3.98 MIL/uL — ABNORMAL LOW (ref 4.22–5.81)
RDW: 12.8 % (ref 11.5–15.5)
WBC: 4.9 10*3/uL (ref 4.0–10.5)

## 2012-03-21 MED ORDER — SODIUM CHLORIDE 0.9 % IV SOLN
INTRAVENOUS | Status: DC
  Filled 2012-03-21: qty 1000

## 2012-03-21 MED ORDER — GABAPENTIN 600 MG PO TABS
300.0000 mg | ORAL_TABLET | Freq: Three times a day (TID) | ORAL | Status: DC
  Filled 2012-03-21 (×2): qty 0.5

## 2012-03-21 MED ORDER — THIAMINE HCL 100 MG PO TABS: 100.0000 mg | ORAL_TABLET | Freq: Every day | ORAL | Status: DC

## 2012-03-21 MED ORDER — GABAPENTIN 300 MG PO CAPS
300.0000 mg | ORAL_CAPSULE | Freq: Three times a day (TID) | ORAL | Status: DC
  Administered 2012-03-21 – 2012-03-22 (×3): 300 mg via ORAL
  Filled 2012-03-21 (×5): qty 1

## 2012-03-21 MED ORDER — VITAMIN B-1 100 MG PO TABS
100.0000 mg | ORAL_TABLET | Freq: Every day | ORAL | Status: DC
  Administered 2012-03-22: 100 mg via ORAL
  Filled 2012-03-21 (×2): qty 1

## 2012-03-21 MED ORDER — FOLIC ACID 1 MG PO TABS: 1.0000 mg | ORAL_TABLET | Freq: Every day | ORAL | Status: DC

## 2012-03-21 MED ORDER — ADULT MULTIVITAMIN W/MINERALS CH: 1.0000 | ORAL_TABLET | Freq: Every day | ORAL | Status: DC

## 2012-03-21 MED ORDER — TRAZODONE HCL 150 MG PO TABS
150.0000 mg | ORAL_TABLET | Freq: Every day | ORAL | Status: DC
  Administered 2012-03-21: 150 mg via ORAL
  Filled 2012-03-21 (×2): qty 1

## 2012-03-21 MED ORDER — FAMOTIDINE 20 MG PO TABS
20.0000 mg | ORAL_TABLET | Freq: Every day | ORAL | Status: DC
  Administered 2012-03-21 – 2012-03-22 (×2): 20 mg via ORAL
  Filled 2012-03-21 (×2): qty 1

## 2012-03-21 MED ORDER — FOLIC ACID 1 MG PO TABS
1.0000 mg | ORAL_TABLET | Freq: Every day | ORAL | Status: DC
  Administered 2012-03-22: 1 mg via ORAL
  Filled 2012-03-21 (×2): qty 1

## 2012-03-21 NOTE — Progress Notes (Signed)
Clinical Social Work Department BRIEF PSYCHOSOCIAL ASSESSMENT 03/21/2012  Patient:  [name] [name], [name] [name]     Account Number:  [id number]     Admit date:  03/19/2012  Clinical Social Worker:  Dennison Bulla  Date/Time:  03/21/2012 11:45 AM  Referred by:  Physician  Date Referred:  03/21/2012 Referred for  Substance Abuse   Other Referral:   Interview type:  Patient Other interview type:    PSYCHOSOCIAL DATA Living Status:  FAMILY Admitted from facility:   Level of care:   Primary support name:  Janyth Pupa Primary support relationship to patient:  CHILD, ADULT Degree of support available:   Adequate    CURRENT CONCERNS Current Concerns  Substance Abuse   Other Concerns:    SOCIAL WORK ASSESSMENT / PLAN CSW received referral to assist with substance abuse counseling. CSW reviewed chart and met with patient at bedside. No visitors were present at the beginning of the assessment but fiance joined during the assessment. Patient agreeable to fiance staying in room for the remainder of the assessment.    CSW introduced myself and explained role. Patient reports that PTA admission he was independent and living with fiance. Patient reports his stomach has been hurting and MD informed in that alcohol use is contributing to pain. Patient reports that he plans to eliminate all alcohol use. Patient reports that he has not drank any alcohol in over a week. Patient reports that he would drink maybe once or twice a week and drink 1 or 2 beers. Patient reports that he plans to eliminate use altogether since MD stated this affects his physical health. Patient denies all other substance use. Patient reports he has been "clean for awhile". Patient reports that he has received treatment in the past and reports he does not need any further treatment or resources.    Patient reports no further needs but reports that at dc he will need a bus pass to get home. Patient reports that he does not drive and has no  family that can assist. CSW is signing off but will assist with transportation at dc.   Assessment/plan status:  No Further Intervention Required Other assessment/ plan:   Information/referral to community resources:   Patient declined all resources    PATIENT'S/FAMILY'S RESPONSE TO PLAN OF CARE: Patient alert and oriented. Patient engaged throughout assessment but appears to minimize use by denying all substance use despite a positive drug screen. Patient appreciative of CSW consult but declined all resources.

## 2012-03-21 NOTE — Progress Notes (Signed)
[facility] PROGRESS NOTE  [name] [name] NWG:[id number] DOB: [birth year] DOA: 03/19/2012 PCP: Lehman Prom, NP  Assessment/Plan: Principal Problem:  *Acute pancreatitis Active Problems:  Substance induced mood disorder  Polysubstance abuse    Acute pancreatitis - most likely alcohol induced. He also uses tobacco. CT abdomen and pelvis shows only borderline pancreatic duct dilation. Lipids are not excessively elevated and he does not appear to be on medications that are likely to cause pancreatitis. Continue with gentle hydration and pain relief medication. Closely follow intake output. Patient will be started on non-fat clear liquids at this time.   Polysubstance abuse - patient states last alcohol drink he had was a week ago. States he has not used cocaine since his detox 2 months ago. UDS is positive for opiates however he has no opiates listed on his home meds.  Advised to quit smoking. The  patient is placed on PRN IV Ativan if needed. Thiamine.  I will consult social work for counseling.   Sinus bradycardia - Asymptomatic. Patient's EKG shows early repolarization changes. Closely observe in telemetry. Patient did have a cardiac catheter in March of this year which was unremarkable. TSH WNL.  If he becomes symptomatic or drops below 35 bpm we will call cardiology.  Of note the patient's pulse rate during his hospitalization in 08/2011 was approx 45 bpm  History of substance related mood disorder.  Patient's psychiatric status appears stable.  Social work providing resources and counseling.   Code Status: full code Family Communication: WIth patient he is alert and orientated Disposition Plan:    Inpatient.  to home when ready  Brief narrative: 46 year old male with history of polysubstance abuse including alcohol and cocaine and tobacco abuse presents to the ER with complaints of abdominal pain. The pain is mostly in the epigastric area. Denies any radiation. Has some nausea  but has not vomited. Has had at least a couple of loose bowel stools. Patient states he has been having pain off and on for last couple of months but worsened last 24 hours. In the ER labs reveal increased lipase level. At this time patient is about to get a CT abdomen pelvis. Patient has been admitted for further management of his acute pancreatitis. Patient has been recently detoxed from polysubstance abuse in August. But still drinks alcohol off and on, usually beer. Last drink was one week ago.    HPI/Subjective: Patient complains of continued epigastric pain.  Objective: Filed Vitals:   03/20/12 1300 03/20/12 1429 03/20/12 2226 03/21/12 0420  BP: 99/55 97/53 118/70 126/84  Pulse: 43 45 45 55  Temp:  97.9 F (36.6 C) 98.4 F (36.9 C) 98.3 F (36.8 C)  TempSrc:  Oral Oral Oral  Resp:  18 18 18   Height:      Weight:    55.5 kg (122 lb 5.7 oz)  SpO2:  93% 99% 98%    Intake/Output Summary (Last 24 hours) at 03/21/12 1133 Last data filed at 03/20/12 1921  Gross per 24 hour  Intake 4766.67 ml  Output    350 ml  Net 4416.67 ml    Exam:   General:  A&O, NAD  Cardiovascular: RRR, No M/R/G, no Lower Extremity Edema.  Respiratory: CTA, No increased work of breathing  Abdomen: Soft, mildly tender to palpation in the epigastrum, ND, positive bowel sounds, no obvious masses  Neurological:  Cranial Nerves 2 - 12 are grossly intact, exam non focal  Psychiatric:  A&O, Cooperative, Grooming good.  Skin:  No rashes,  bruises, or lesions  Data Reviewed: Basic Metabolic Panel:  Lab 03/21/12 5409 03/20/12 0222 03/19/12 1834  NA 137 133* 136  K 3.9 3.7 3.9  CL 106 100 99  CO2 21 23 24   GLUCOSE 65* 80 94  BUN 8 10 12   CREATININE 1.27 1.21 1.18  CALCIUM 8.5 9.2 9.9  MG -- -- --  PHOS -- -- --   Liver Function Tests:  Lab 03/21/12 0605 03/20/12 0222  AST 18 18  ALT 9 12  ALKPHOS 56 62  BILITOT 0.6 0.7  PROT 6.0 6.8  ALBUMIN 3.2* 3.7    Lab 03/20/12 0222 03/19/12  2216  LIPASE 1138* 1661*  AMYLASE -- --   CBC:  Lab 03/21/12 0605 03/20/12 0700 03/20/12 0222 03/19/12 1834  WBC 4.9 4.1 DUPLICATE 5.6  NEUTROABS -- 1.6* PENDING --  HGB 12.2* 13.0 DUPLICATE 15.2  HCT 35.2* 37.2* DUPLICATE 42.1  MCV 88.4 87.9 DUPLICATE 87.7  PLT 149* 157 DUPLICATE 201   Cardiac Enzymes:  Lab 03/20/12 1456 03/20/12 0828 03/20/12 0213  CKTOTAL -- -- --  CKMB -- -- --  CKMBINDEX -- -- --  TROPONINI <0.30 <0.30 <0.30   BNP (last 3 results)  Basename 09/19/11 0325  PROBNP 34.9   studies: Ct Abdomen Pelvis W Contrast  03/20/2012  *RADIOLOGY REPORT*  Clinical Data: Elevated lipase.  Probable pancreatitis.  Abdominal pain.  History of ethanol, cocaine, marijuana abuse.  CT ABDOMEN AND PELVIS WITH CONTRAST  Technique:  Multidetector CT imaging of the abdomen and pelvis was performed following the standard protocol during bolus administration of intravenous contrast.  Contrast: OMNIPAQUE IOHEXOL 300 MG/ML  SOLN  Comparison: ultrasound abdomen 02/08/2012.  No prior CT.  Findings: Left lower lobe pulmonary opacity which measures 1.7 cm on image 10.  Dependent.  Normal heart size without pericardial or pleural effusion. Hemangioma in the right lobe of the liver at 1.7 cm on image 24. Normal spleen.  The stomach is borderline dilated, without cause identified.  The pancreatic duct is upper normal.  No evidence of acute pancreatitis nor peripancreatic fluid collection.  Normal gallbladder, without biliary ductal dilatation.  Normal adrenal glands and kidneys.  No retroperitoneal or retrocrural adenopathy.  Normal colon.  The appendix is not well visualized.  Possibly present on image 58.  No right lower quadrant inflammation is seen.  Normal small bowel without abdominal ascites.    No pelvic adenopathy.    Normal urinary bladder and prostate.  No significant free fluid.  No acute osseous abnormality.  IMPRESSION:  1. No acute process in the abdomen or pelvis.  No evidence of  acute pancreatitis, nor its complications.  The pancreatic duct is borderline dilated, but no obstructive cause is seen. 2.  Left lung base opacity which is favored to represent an area of atelectasis.  Correlate with any infectious symptoms to exclude infection.  A pulmonary nodule is felt less likely, given morphology.  Recommend follow-up with either abdominal or chest CT of 3 months to confirm stability or resolution of this appearance.   Original Report Authenticated By: Consuello Bossier, M.D.     Scheduled Meds:    . antiseptic oral rinse  15 mL Mouth Rinse q12n4p  . chlorhexidine  15 mL Mouth Rinse BID  . folic acid  1 mg Intravenous Daily  . multivitamin with minerals  1 tablet Oral Daily  . sodium chloride  3 mL Intravenous Q12H  . thiamine  100 mg Intravenous Daily   Continuous Infusions:    .  sodium chloride Stopped (03/20/12 2330)  . sodium chloride 0.9 % 1,000 mL infusion 100 mL/hr at 03/21/12 2841     Stephani Police [facility] Pager [phone]  If 7PM-7AM, please contact night-coverage [email or web address] Password [email or web address] 03/21/2012, 11:33 AM   LOS: 2 days   Attending -I have seen and examined the patient, agree with the above assessment and plan. Patient claims to feel much better today, no nausea or vomiting. Significant less abd pain, will start trial of clear liquids. Have counseled patient extensively regarding need for complete cessation of ETOH. He understands  Windell Norfolk MD

## 2012-03-21 NOTE — Discharge Summary (Signed)
Physician Discharge Summary  [name] [name] GNF:[id number] DOB: [birth year] DOA: 03/19/2012  PCP: Quitman Livings, MD  Admit date: 03/19/2012 Discharge date: 03/22/2012  Recommendations for Outpatient Follow-up:  Patient needs support not to drink alcohol.   Discharge Diagnoses:  Principal Problem:  *Acute pancreatitis Active Problems:  Substance induced mood disorder  Polysubstance abuse    Discharge Condition: Stable.  Able to tolerate a soft solid, low fat diet.  Diet recommendation:  Frequent small meals, soft solid, low fat diet.   History of present illness:  46 year old male with history of polysubstance abuse including alcohol and cocaine and tobacco abuse presents to the ER with complaints of abdominal pain. The pain is mostly in the epigastric area. Denies any radiation. Has some nausea but has not vomited. Has had at least a couple of loose bowel stools. Patient states he has been having pain off and on for last couple of months but worsened last 24 hours. In the ER labs reveal increased lipase level. At this time patient is about to get a CT abdomen pelvis. Patient has been admitted for further management of his acute pancreatitis. Patient has been recently detoxed from polysubstance abuse in August. But still drinks alcohol off and on, usually beer. Last drink was one week ago.   Hospital Course:   Acute pancreatitis - most likely alcohol induced. He also uses tobacco. CT abdomen and pelvis shows only borderline pancreatic duct dilation. Lipids are not excessively elevated and he does not appear to be on medications that are likely to cause pancreatitis. Continue with gentle hydration and pain relief medication. Closely follow intake output. Patient's diet was slowly progressed.  He is able to tolerate solid food.   Polysubstance abuse - patient states last alcohol drink he had was a week ago. States he has not used cocaine since his detox 2 months ago. UDS is positive for  opiates however he has no opiates listed on his home meds. Advised to quit smoking. The patient is placed on PRN IV Ativan if needed. Thiamine. I will consult social work for counseling.   Sinus bradycardia - Asymptomatic. Patient's EKG shows early repolarization changes. He was closely observe in telemetry and did not have any red alarms other than bradycardia. Patient did have a cardiac catheter in March of this year which was unremarkable. TSH WNL.  Of note the patient's pulse rate during his hospitalization in 08/2011 was approx 45 bpm   History of substance related mood disorder. Patient's psychiatric status appears stable. Social work providing resources and counseling as patient's UDS was positive for opiates, but no opiates were listed on his home medication list.   Discharge Exam: Filed Vitals:   03/22/12 0554  BP: 123/75  Pulse: 50  Temp: 98.3 F (36.8 C)  Resp: 20   Filed Vitals:   03/21/12 0420 03/21/12 1400 03/21/12 2110 03/22/12 0554  BP: 126/84 118/70 116/69 123/75  Pulse: 55 70 47 50  Temp: 98.3 F (36.8 C) 98.7 F (37.1 C) 98.5 F (36.9 C) 98.3 F (36.8 C)  TempSrc: Oral Oral Oral Oral  Resp: 18 18 18 20   Height:      Weight: 55.5 kg (122 lb 5.7 oz)   49.6 kg (109 lb 5.6 oz)  SpO2: 98% 96% 95% 96%   General: A&O, NAD, Pleasant.  Up at the sink shaving. Cardiovascular: RRR, No M/R/G Respiratory: CTA, No W/C/R Abdomen:  Soft, Slightly tender in epigastrum, active BS, no obvious organomegaly. Extremities:  No edema, able to move  all 4 extremities. Skin:  No rashes or Bruises.  Discharge Instructions      Discharge Orders    Future Orders Please Complete By Expires   Diet - low sodium heart healthy      Increase activity slowly          Medication List     As of 03/22/2012  1:42 PM    TAKE these medications         famotidine 20 MG tablet   Commonly known as: PEPCID   Take 20 mg by mouth 2 (two) times daily.      folic acid 1 MG tablet    Commonly known as: FOLVITE   Take 1 tablet (1 mg total) by mouth daily.      gabapentin 300 MG capsule   Commonly known as: NEURONTIN   Take 300 mg by mouth 3 (three) times daily. For anxiety and pain.      multivitamin with minerals Tabs   Take 1 tablet by mouth daily.      oxyCODONE-acetaminophen 5-325 MG per tablet   Commonly known as: PERCOCET/ROXICET   Take 1 tablet by mouth every 4 (four) hours as needed for pain.      pravastatin 20 MG tablet   Commonly known as: PRAVACHOL   Take 20 mg by mouth at bedtime.      promethazine 25 MG tablet   Commonly known as: PHENERGAN   Take 25 mg by mouth every 6 (six) hours as needed. For nausea/vomiting      thiamine 100 MG tablet   Take 1 tablet (100 mg total) by mouth daily.      traZODone 150 MG tablet   Commonly known as: DESYREL   Take 150 mg by mouth at bedtime.        Follow-up Information    Follow up with [facility], [place]. On 04/14/2012. (2:00 please bring medicaid card with you, initial visit is $50)    Contact information:   [address] [place] [address] [phone]           The results of significant diagnostics from this hospitalization (including imaging, microbiology, ancillary and laboratory) are listed below for reference.    Significant Diagnostic Studies: Ct Abdomen Pelvis W Contrast  03/20/2012  *RADIOLOGY REPORT*  Clinical Data: Elevated lipase.  Probable pancreatitis.  Abdominal pain.  History of ethanol, cocaine, marijuana abuse.  CT ABDOMEN AND PELVIS WITH CONTRAST  Technique:  Multidetector CT imaging of the abdomen and pelvis was performed following the standard protocol during bolus administration of intravenous contrast.  Contrast: OMNIPAQUE IOHEXOL 300 MG/ML  SOLN  Comparison: ultrasound abdomen 02/08/2012.  No prior CT.  Findings: Left lower lobe pulmonary opacity which measures 1.7 cm on image 10.  Dependent.  Normal heart size without pericardial or pleural effusion.  Hemangioma in the right lobe of the liver at 1.7 cm on image 24. Normal spleen.  The stomach is borderline dilated, without cause identified.  The pancreatic duct is upper normal.  No evidence of acute pancreatitis nor peripancreatic fluid collection.  Normal gallbladder, without biliary ductal dilatation.  Normal adrenal glands and kidneys.  No retroperitoneal or retrocrural adenopathy.  Normal colon.  The appendix is not well visualized.  Possibly present on image 58.  No right lower quadrant inflammation is seen.  Normal small bowel without abdominal ascites.    No pelvic adenopathy.    Normal urinary bladder and prostate.  No significant free fluid.  No acute osseous abnormality.  IMPRESSION:  1. No acute process in the abdomen or pelvis.  No evidence of acute pancreatitis, nor its complications.  The pancreatic duct is borderline dilated, but no obstructive cause is seen. 2.  Left lung base opacity which is favored to represent an area of atelectasis.  Correlate with any infectious symptoms to exclude infection.  A pulmonary nodule is felt less likely, given morphology.  Recommend follow-up with either abdominal or chest CT of 3 months to confirm stability or resolution of this appearance.   Original Report Authenticated By: Consuello Bossier, M.D.      Labs: Basic Metabolic Panel:  Lab 03/21/12 1610 03/20/12 0222 03/19/12 1834  NA 137 133* 136  K 3.9 3.7 3.9  CL 106 100 99  CO2 21 23 24   GLUCOSE 65* 80 94  BUN 8 10 12   CREATININE 1.27 1.21 1.18  CALCIUM 8.5 9.2 9.9  MG -- -- --  PHOS -- -- --   Liver Function Tests:  Lab 03/21/12 0605 03/20/12 0222  AST 18 18  ALT 9 12  ALKPHOS 56 62  BILITOT 0.6 0.7  PROT 6.0 6.8  ALBUMIN 3.2* 3.7    Lab 03/20/12 0222 03/19/12 2216  LIPASE 1138* 1661*  AMYLASE -- --   CBC:  Lab 03/21/12 0605 03/20/12 0700 03/20/12 0222 03/19/12 1834  WBC 4.9 4.1 DUPLICATE 5.6  NEUTROABS -- 1.6* PENDING --  HGB 12.2* 13.0 DUPLICATE 15.2  HCT 35.2* 37.2*  DUPLICATE 42.1  MCV 88.4 87.9 DUPLICATE 87.7  PLT 149* 157 DUPLICATE 201   Cardiac Enzymes:  Lab 03/20/12 1456 03/20/12 0828 03/20/12 0213  CKTOTAL -- -- --  CKMB -- -- --  CKMBINDEX -- -- --  TROPONINI <0.30 <0.30 <0.30   BNP: BNP (last 3 results)  Basename 09/19/11 0325  PROBNP 34.9    Time coordinating discharge: 30 min.  Signed:  Algis Downs, PA-C [facility] Pager: [phone]   03/22/2012, 1:42 PM

## 2012-03-22 MED ORDER — OXYCODONE-ACETAMINOPHEN 5-325 MG PO TABS: 1.0000 | ORAL_TABLET | ORAL | Status: DC | PRN

## 2012-03-22 NOTE — Progress Notes (Signed)
Clinical Social Work  Per Charity fundraiser, patient ready to [employer]. CSW and patient previously discussed patient taking the bus to get home. Patient is familiar with bus and knows to get home from bus station at hospital. CSW provided patient with bus pass. CSW is signing off.  [place], [place] [phone]

## 2012-03-22 NOTE — Discharge Summary (Signed)
Attending Note  I have seen and examined pt who is a 46 year old male with history of polysubstance abuse including alcohol and cocaine and tobacco abuse admitted with abdominal pain and found to have acute pancreatitis. He has improved clinically with conservative management and is tolerating by mouth's well, and denies any complaints on followup today. I agree with Marianne's evaluation and plan, please see detailed discharge summary. He is to follow up outpatient as directed.   Donnalee Curry [facility] [phone]

## 2012-03-22 NOTE — Progress Notes (Signed)
Patient discharge and reviewed discharge insrtuctions  reviewed and patient verbalized understanding of discharge. IV removed cath to intact

## 2012-03-27 NOTE — Progress Notes (Signed)
-  agree with above, NPO, IV fluids and narcotics

## 2012-06-07 ENCOUNTER — Emergency Department (HOSPITAL_COMMUNITY)
Admission: EM | Admit: 2012-06-07 | Discharge: 2012-06-07 | Disposition: A | Discharge status: Home / Self Care | Payer: Medicaid Other | Attending: Emergency Medicine | Admitting: Emergency Medicine

## 2012-06-07 ENCOUNTER — Other Ambulatory Visit: Payer: Self-pay

## 2012-06-07 ENCOUNTER — Encounter (HOSPITAL_COMMUNITY): Payer: Self-pay | Admitting: *Deleted

## 2012-06-07 DIAGNOSIS — Z8679 Personal history of other diseases of the circulatory system: Secondary | ICD-10-CM | POA: Insufficient documentation

## 2012-06-07 DIAGNOSIS — F319 Bipolar disorder, unspecified: Secondary | ICD-10-CM | POA: Insufficient documentation

## 2012-06-07 DIAGNOSIS — Z9861 Coronary angioplasty status: Secondary | ICD-10-CM | POA: Insufficient documentation

## 2012-06-07 DIAGNOSIS — Z79899 Other long term (current) drug therapy: Secondary | ICD-10-CM | POA: Insufficient documentation

## 2012-06-07 DIAGNOSIS — F1021 Alcohol dependence, in remission: Secondary | ICD-10-CM | POA: Insufficient documentation

## 2012-06-07 DIAGNOSIS — R079 Chest pain, unspecified: Secondary | ICD-10-CM

## 2012-06-07 DIAGNOSIS — Z8719 Personal history of other diseases of the digestive system: Secondary | ICD-10-CM | POA: Insufficient documentation

## 2012-06-07 DIAGNOSIS — F17200 Nicotine dependence, unspecified, uncomplicated: Secondary | ICD-10-CM | POA: Insufficient documentation

## 2012-06-07 DIAGNOSIS — F1210 Cannabis abuse, uncomplicated: Secondary | ICD-10-CM | POA: Insufficient documentation

## 2012-06-07 DIAGNOSIS — I2510 Atherosclerotic heart disease of native coronary artery without angina pectoris: Secondary | ICD-10-CM | POA: Insufficient documentation

## 2012-06-07 MED ORDER — GI COCKTAIL ~~LOC~~
30.0000 mL | Freq: Once | ORAL | Status: AC
  Administered 2012-06-07: 30 mL via ORAL
  Filled 2012-06-07: qty 30

## 2012-06-07 MED ORDER — OMEPRAZOLE 20 MG PO CPDR: 20.0000 mg | DELAYED_RELEASE_CAPSULE | Freq: Every day | ORAL | Status: DC

## 2012-06-07 NOTE — ED Provider Notes (Signed)
History     CSN: [id number]  Arrival date & time 06/07/12  0500   First MD Initiated Contact with Patient 06/07/12 0602      Chief Complaint  Patient presents with  . Chest Pain    (Consider location/radiation/quality/duration/timing/severity/associated sxs/prior treatment) HPI  47 year old male with history of coronary artery disease with nonobstructive cath in March of 2013, history of alcohol abuse, and history of stomach ulcer presents complaining of chest pain. Patient reports gradual onset of burning sensation to his mid chest radiating up to his throat which started around 8 PM last night. Pain was persistent, moderate in severity, unrelieved with drinking hot water, in which he usually does when he has heartburn. Pain felt very similar to prior heart. Patient admits to shrinking to 40oz  of alcohol and ate pepperoni spaghetti. patient denies fever, chills, shortness of breath, vomiting, diarrhea, hematemesis, hematochezia, melena, or any significant abdominal pain. He denies any exertional chest pain, shortness of breath, nausea or diaphoresis.  Denies any recent cocaine use.  Past Medical History  Diagnosis Date  . Chest pain, mid sternal   . CAD in native artery     a. Nonobstructive cath 11/2007;  b. Presented with ST elevation - Nonobstructive cath 08/2011  . GERD (gastroesophageal reflux disease)   . Tobacco abuse   . Marijuana abuse     a. uses ~ 1x /wk or less  . History of cocaine abuse     a. quit ? 2009  . History of ETOH abuse     a. drinks 2 "40's" / wk  . Bradycardia     a. asymptomatic  . Bipolar disorder   . Syncope     a. 12/2010 - presumed to be vasovagal  . Abnormal ECG     a. early repolarization  . Coronary artery disease     Past Surgical History  Procedure Date  . Coronary angioplasty with stent placement     No family history on file.  History  Substance Use Topics  . Smoking status: Current Every Day Smoker -- 0.5 packs/day for 20 years      Types: Cigarettes  . Smokeless tobacco: Not on file     Comment: Smokes about 3 cigarettes/day  . Alcohol Use: Yes     Comment: 4-5 40oz/day      Review of Systems  Constitutional: Negative for fever.  Respiratory: Negative for shortness of breath.   Cardiovascular: Positive for chest pain. Negative for palpitations.  Gastrointestinal: Negative for vomiting, abdominal pain and diarrhea.  Musculoskeletal: Negative for back pain.  Skin: Negative for rash.  Neurological: Negative for numbness.    Allergies  Aspirin; Pepperoni; Tomato; and Tylenol  Home Medications   Current Outpatient Rx  Name  Route  Sig  Dispense  Refill  . FAMOTIDINE 20 MG PO TABS   Oral   Take 20 mg by mouth 2 (two) times daily as needed. For acid reflux         . FOLIC ACID 1 MG PO TABS   Oral   Take 1 tablet (1 mg total) by mouth daily.         Marland Kitchen GABAPENTIN 300 MG PO CAPS   Oral   Take 300 mg by mouth 3 (three) times daily. For anxiety and pain.          . ADULT MULTIVITAMIN W/MINERALS CH   Oral   Take 1 tablet by mouth daily.         Marland Kitchen  OXYCODONE-ACETAMINOPHEN 5-325 MG PO TABS   Oral   Take 1 tablet by mouth every 4 (four) hours as needed for pain.   30 tablet   0   . PRAVASTATIN SODIUM 20 MG PO TABS   Oral   Take 20 mg by mouth at bedtime.         Marland Kitchen PROMETHAZINE HCL 25 MG PO TABS   Oral   Take 25 mg by mouth every 6 (six) hours as needed. For nausea/vomiting         . THIAMINE HCL 100 MG PO TABS   Oral   Take 1 tablet (100 mg total) by mouth daily.         . TRAZODONE HCL 150 MG PO TABS   Oral   Take 150 mg by mouth at bedtime.           BP 126/68  Pulse 88  Temp 97.3 F (36.3 C) (Oral)  Resp 17  SpO2 96%  Physical Exam  Nursing note and vitals reviewed. Constitutional: He is oriented to person, place, and time. He appears well-developed and well-nourished. No distress.  HENT:  Head: Atraumatic.  Mouth/Throat: Oropharynx is clear and moist.  Eyes:  Conjunctivae normal are normal.  Neck: Normal range of motion. Neck supple.  Cardiovascular: Normal rate and regular rhythm.  Exam reveals no gallop and no friction rub.   No murmur heard. Pulmonary/Chest: Effort normal and breath sounds normal. No respiratory distress. He has no wheezes. He exhibits no tenderness.  Abdominal: Soft. Bowel sounds are normal. There is no tenderness.  Musculoskeletal: He exhibits no edema.  Lymphadenopathy:    He has no cervical adenopathy.  Neurological: He is alert and oriented to person, place, and time.  Skin: Skin is warm. No rash noted.  Psychiatric: He has a normal mood and affect.    ED Course  Procedures (including critical care time)  Labs Reviewed - No data to display No results found.   No diagnosis found.   Date: 06/07/2012  Rate: 84  Rhythm: normal sinus rhythm  QRS Axis: normal  Intervals: normal  ST/T Wave abnormalities: normal  Conduction Disutrbances:none  Narrative Interpretation: right atrial enlargement  Old EKG Reviewed: unchanged  1. GERD  MDM   patient's chest pain is likely secondary to his alcohol use and history of GERD, and is less likely to be cardiac. Patient has marked improvement after receiving GI cocktail. His initial pain scale 7/10 and now it is 2/10. I discussed alcohol cessation, and give patient referral for outpatient care. Patient is currently stable.   7:21 AM Care discussed with my attending.  Pt stable for discharge.  Recommend f/u with PCP.  Will d/c with PPI and H2 blocker  BP 118/61  Pulse 72  Temp 97.3 F (36.3 C) (Oral)  Resp 23  SpO2 97%  I have reviewed nursing notes and vital signs.  I reviewed available ER/hospitalization records thought the EMR      Fayrene Helper, PA-C 06/07/12 3086  Fayrene Helper, PA-C 06/07/12 [phone]

## 2012-06-07 NOTE — ED Notes (Signed)
The pt  Is c/o burning chest pain for 2 hours no sob  No other  Symptoms.  He has stents in his coronary arteries

## 2012-06-07 NOTE — ED Provider Notes (Signed)
Medical screening examination/treatment/procedure(s) were conducted as a shared visit with non-physician practitioner(s) and myself.  I personally evaluated the patient during the encounter  Pt well appearing, sleeping on my arrival to the room, reports he feels improved Stable for d/c  Joya Gaskins, MD 06/07/12 [phone]

## 2012-06-07 NOTE — ED Notes (Signed)
Pt is sleeping

## 2012-06-07 NOTE — ED Notes (Signed)
Pt requests "GI cocktail" states that his doctor told him not to drink and he had a few drinks and now is "burning in his chest"

## 2012-06-11 ENCOUNTER — Emergency Department (HOSPITAL_COMMUNITY)
Admission: EM | Admit: 2012-06-11 | Discharge: 2012-06-11 | Disposition: A | Discharge status: Home / Self Care | Payer: Medicaid Other | Attending: Emergency Medicine | Admitting: Emergency Medicine

## 2012-06-11 ENCOUNTER — Encounter (HOSPITAL_COMMUNITY): Payer: Self-pay | Admitting: *Deleted

## 2012-06-11 ENCOUNTER — Emergency Department (HOSPITAL_COMMUNITY): Payer: Medicaid Other

## 2012-06-11 DIAGNOSIS — I2510 Atherosclerotic heart disease of native coronary artery without angina pectoris: Secondary | ICD-10-CM | POA: Insufficient documentation

## 2012-06-11 DIAGNOSIS — R9389 Abnormal findings on diagnostic imaging of other specified body structures: Secondary | ICD-10-CM | POA: Insufficient documentation

## 2012-06-11 DIAGNOSIS — K2970 Gastritis, unspecified, without bleeding: Secondary | ICD-10-CM | POA: Insufficient documentation

## 2012-06-11 DIAGNOSIS — Z79899 Other long term (current) drug therapy: Secondary | ICD-10-CM | POA: Insufficient documentation

## 2012-06-11 DIAGNOSIS — F1010 Alcohol abuse, uncomplicated: Secondary | ICD-10-CM | POA: Insufficient documentation

## 2012-06-11 DIAGNOSIS — R0682 Tachypnea, not elsewhere classified: Secondary | ICD-10-CM | POA: Insufficient documentation

## 2012-06-11 DIAGNOSIS — F3160 Bipolar disorder, current episode mixed, unspecified: Secondary | ICD-10-CM | POA: Insufficient documentation

## 2012-06-11 DIAGNOSIS — F1910 Other psychoactive substance abuse, uncomplicated: Secondary | ICD-10-CM | POA: Insufficient documentation

## 2012-06-11 DIAGNOSIS — F17200 Nicotine dependence, unspecified, uncomplicated: Secondary | ICD-10-CM | POA: Insufficient documentation

## 2012-06-11 DIAGNOSIS — K219 Gastro-esophageal reflux disease without esophagitis: Secondary | ICD-10-CM | POA: Insufficient documentation

## 2012-06-11 LAB — HEPATIC FUNCTION PANEL
ALT: 19 U/L (ref 0–53)
AST: 31 U/L (ref 0–37)
Albumin: 3.7 g/dL (ref 3.5–5.2)
Alkaline Phosphatase: 82 U/L (ref 39–117)
Bilirubin, Direct: <0.1 mg/dL (ref 0.0–0.3)
Total Bilirubin: 0.5 mg/dL (ref 0.3–1.2)
Total Protein: 7.4 g/dL (ref 6.0–8.3)

## 2012-06-11 LAB — BASIC METABOLIC PANEL
BUN: 16 mg/dL (ref 6–23)
CO2: 24 mEq/L (ref 19–32)
Chloride: 100 mEq/L (ref 96–112)
Glucose, Bld: 97 mg/dL (ref 70–99)
Potassium: 4.3 mEq/L (ref 3.5–5.1)
Sodium: 136 mEq/L (ref 135–145)

## 2012-06-11 LAB — LIPASE, BLOOD: Lipase: 68 U/L — ABNORMAL HIGH (ref 11–59)

## 2012-06-11 LAB — CBC
HCT: 41.7 % (ref 39.0–52.0)
Hemoglobin: 14.6 g/dL (ref 13.0–17.0)
RBC: 4.66 MIL/uL (ref 4.22–5.81)
WBC: 4.5 10*3/uL (ref 4.0–10.5)

## 2012-06-11 LAB — POCT I-STAT TROPONIN I

## 2012-06-11 MED ORDER — OXYCODONE HCL 5 MG PO TABS: 5.0000 mg | ORAL_TABLET | ORAL | Status: DC | PRN

## 2012-06-11 MED ORDER — SUCRALFATE 1 G PO TABS: 1.0000 g | ORAL_TABLET | Freq: Four times a day (QID) | ORAL | Status: DC

## 2012-06-11 MED ORDER — GI COCKTAIL ~~LOC~~
30.0000 mL | Freq: Once | ORAL | Status: AC
  Administered 2012-06-11: 30 mL via ORAL
  Filled 2012-06-11: qty 30

## 2012-06-11 MED ORDER — OXYCODONE HCL 5 MG PO TABS
10.0000 mg | ORAL_TABLET | Freq: Once | ORAL | Status: AC
  Administered 2012-06-11: 10 mg via ORAL
  Filled 2012-06-11: qty 1

## 2012-06-11 MED ORDER — PANTOPRAZOLE SODIUM 40 MG PO TBEC
40.0000 mg | DELAYED_RELEASE_TABLET | Freq: Once | ORAL | Status: AC
  Administered 2012-06-11: 40 mg via ORAL
  Filled 2012-06-11: qty 1

## 2012-06-11 MED ORDER — FAMOTIDINE 20 MG PO TABS: 20.0000 mg | ORAL_TABLET | Freq: Two times a day (BID) | ORAL | Status: DC

## 2012-06-11 NOTE — ED Notes (Signed)
Pt back from X-ray.  Placed on cardiac monitor and given GI cocktail.  Dr [name] Fleeting at bedside.

## 2012-06-11 NOTE — ED Notes (Signed)
Pt is here with burning sensation to chest and was treated here on New Years for same symptoms.

## 2012-06-11 NOTE — ED Provider Notes (Signed)
47 year old male comes in with burning epigastric pain which radiates up to the chest. He had a similar pain 5 days ago and this pain which started last night. On both occasions, he had had alcohol to drink. On exam, her lungs are clear and heart has regular rate rhythm. There is moderate epigastric tenderness. He has had slight relief with a GI cocktail. Old records were reviewed and he had a catheterization in March of 2013 which showed essentially clean coronary arteries. 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 He had been given a prescription for omeprazole, but had not gotten it filled because he did not have money for it.  ECG shows normal sinus rhythm with a rate of 70, no ectopy. Normal axis. Normal P wave. Normal QRS. Normal intervals. Normal ST and T waves. Impression: normal ECG. Compared with ECG of 06/07/2012, no significant changes are seen.  Medical screening examination/treatment/procedure(s) were conducted as a shared visit with non-physician practitioner(s) and myself.  I personally evaluated the patient during the encounter   Dione Booze, MD 06/11/12 1615

## 2012-06-11 NOTE — ED Provider Notes (Signed)
History     CSN: [id number]  Arrival date & time 06/11/12  1431   First MD Initiated Contact with Patient 06/11/12 1510      Chief Complaint  Patient presents with  . Chest Pain    (Consider location/radiation/quality/duration/timing/severity/associated sxs/prior treatment) HPI The patient presents to the emergency department with chest discomfort.  Patient states that started early this morning.  It has been fairly constant, but gets more intense at times.  Patient states that he has not had shortness of breath, nausea, vomiting, diarrhea, headache, visual changes, dizziness, syncope, back pain, fever, or cough.  Patient states he did not take anything prior to arrival for her symptoms.  Patient states that he has had previous episodes of this type of pain with his acid reflux.  Past Medical History  Diagnosis Date  . Chest pain, mid sternal   . CAD in native artery     a. Nonobstructive cath 11/2007;  b. Presented with ST elevation - Nonobstructive cath 08/2011  . GERD (gastroesophageal reflux disease)   . Tobacco abuse   . Marijuana abuse     a. uses ~ 1x /wk or less  . History of cocaine abuse     a. quit ? 2009  . History of ETOH abuse     a. drinks 2 "40's" / wk  . Bradycardia     a. asymptomatic  . Bipolar disorder   . Syncope     a. 12/2010 - presumed to be vasovagal  . Abnormal ECG     a. early repolarization  . Coronary artery disease     Past Surgical History  Procedure Date  . Coronary angioplasty with stent placement     No family history on file.  History  Substance Use Topics  . Smoking status: Current Every Day Smoker -- 0.5 packs/day for 20 years    Types: Cigarettes  . Smokeless tobacco: Not on file     Comment: Smokes about 3 cigarettes/day  . Alcohol Use: Yes     Comment: 4-5 40oz/day      Review of Systems All other systems negative except as documented in the HPI. All pertinent positives and negatives as reviewed in the HPI.  Allergies    Aspirin; Pepperoni; Tomato; and Tylenol  Home Medications   Current Outpatient Rx  Name  Route  Sig  Dispense  Refill  . FAMOTIDINE 20 MG PO TABS   Oral   Take 20 mg by mouth 2 (two) times daily as needed. For acid reflux         . FOLIC ACID 1 MG PO TABS   Oral   Take 1 tablet (1 mg total) by mouth daily.         Marland Kitchen GABAPENTIN 300 MG PO CAPS   Oral   Take 300 mg by mouth 3 (three) times daily. For anxiety and pain.          . ADULT MULTIVITAMIN W/MINERALS CH   Oral   Take 1 tablet by mouth daily.         Marland Kitchen OMEPRAZOLE 20 MG PO CPDR   Oral   Take 1 capsule (20 mg total) by mouth daily.   20 capsule   0   . OXYCODONE-ACETAMINOPHEN 5-325 MG PO TABS   Oral   Take 1 tablet by mouth every 4 (four) hours as needed for pain.   30 tablet   0   . PRAVASTATIN SODIUM 20 MG PO TABS   Oral  Take 20 mg by mouth at bedtime.         Marland Kitchen PROMETHAZINE HCL 25 MG PO TABS   Oral   Take 25 mg by mouth every 6 (six) hours as needed. For nausea/vomiting         . THIAMINE HCL 100 MG PO TABS   Oral   Take 1 tablet (100 mg total) by mouth daily.         . TRAZODONE HCL 150 MG PO TABS   Oral   Take 150 mg by mouth at bedtime.           BP 114/66  Pulse 76  Temp 98 F (36.7 C) (Oral)  Resp 18  SpO2 96%  Physical Exam  Constitutional: He appears well-developed and well-nourished. No distress.  HENT:  Head: Normocephalic and atraumatic.  Mouth/Throat: Oropharynx is clear and moist.  Eyes: Pupils are equal, round, and reactive to light.  Cardiovascular: Normal rate, regular rhythm and normal heart sounds.  Exam reveals no gallop and no friction rub.   No murmur heard. Pulmonary/Chest: Effort normal and breath sounds normal. No respiratory distress.  Abdominal: Soft. Bowel sounds are normal. He exhibits no distension. There is tenderness in the epigastric area. There is no rigidity, no rebound and no guarding.      ED Course  Procedures (including critical  care time)  Labs Reviewed  BASIC METABOLIC PANEL - Abnormal; Notable for the following:    GFR calc non Af Amer 70 (*)     GFR calc Af Amer 81 (*)     All other components within normal limits  CBC  POCT I-STAT TROPONIN I   Dg Chest 2 View  06/11/2012  *RADIOLOGY REPORT*  Clinical Data: Chest discomfort and upper abdominal pain  CHEST - 2 VIEW  Comparison: 02/08/2012  Findings: Cardiomediastinal silhouette is within normal limits. The lungs are clear. No pleural effusion.  No pneumothorax.  No acute osseous abnormality.  The arms overlie the chest on the lateral projection and obscure detail.  IMPRESSION: No acute cardiopulmonary process.   Original Report Authenticated By: Christiana Pellant, M.D.     Upon review of patient's past medical records, he has had a catheterization in the recent past that didn't show any disease.  Patient be treated for GERD and mild pancreatitis based on his history or recent alcohol consumption and previous history. Patient has been stable here and is feeling better.  MDM  MDM Reviewed: nursing note and vitals Interpretation: labs, ECG and x-ray           Carlyle Dolly, PA-C 06/11/12 1858

## 2012-07-21 ENCOUNTER — Encounter (HOSPITAL_COMMUNITY): Payer: Self-pay | Admitting: Emergency Medicine

## 2012-07-21 ENCOUNTER — Emergency Department (HOSPITAL_COMMUNITY)
Admission: EM | Admit: 2012-07-21 | Discharge: 2012-07-21 | Disposition: A | Discharge status: Home / Self Care | Payer: Medicaid Other | Attending: Emergency Medicine | Admitting: Emergency Medicine

## 2012-07-21 DIAGNOSIS — I2510 Atherosclerotic heart disease of native coronary artery without angina pectoris: Secondary | ICD-10-CM | POA: Insufficient documentation

## 2012-07-21 DIAGNOSIS — F1010 Alcohol abuse, uncomplicated: Secondary | ICD-10-CM | POA: Insufficient documentation

## 2012-07-21 DIAGNOSIS — F17200 Nicotine dependence, unspecified, uncomplicated: Secondary | ICD-10-CM | POA: Insufficient documentation

## 2012-07-21 DIAGNOSIS — F3130 Bipolar disorder, current episode depressed, mild or moderate severity, unspecified: Secondary | ICD-10-CM | POA: Insufficient documentation

## 2012-07-21 DIAGNOSIS — F32A Depression, unspecified: Secondary | ICD-10-CM

## 2012-07-21 DIAGNOSIS — F329 Major depressive disorder, single episode, unspecified: Secondary | ICD-10-CM

## 2012-07-21 DIAGNOSIS — K219 Gastro-esophageal reflux disease without esophagitis: Secondary | ICD-10-CM | POA: Insufficient documentation

## 2012-07-21 DIAGNOSIS — F319 Bipolar disorder, unspecified: Secondary | ICD-10-CM

## 2012-07-21 DIAGNOSIS — Z79899 Other long term (current) drug therapy: Secondary | ICD-10-CM | POA: Insufficient documentation

## 2012-07-21 DIAGNOSIS — Z8679 Personal history of other diseases of the circulatory system: Secondary | ICD-10-CM | POA: Insufficient documentation

## 2012-07-21 LAB — CBC WITH DIFFERENTIAL/PLATELET
Basophils Absolute: 0 10*3/uL (ref 0.0–0.1)
Eosinophils Relative: 2 % (ref 0–5)
HCT: 42.5 % (ref 39.0–52.0)
Hemoglobin: 15 g/dL (ref 13.0–17.0)
Lymphocytes Relative: 44 % (ref 12–46)
Lymphs Abs: 2.9 10*3/uL (ref 0.7–4.0)
MCV: 89.5 fL (ref 78.0–100.0)
Monocytes Absolute: 0.4 10*3/uL (ref 0.1–1.0)
Monocytes Relative: 6 % (ref 3–12)
Neutro Abs: 3.2 10*3/uL (ref 1.7–7.7)
RBC: 4.75 MIL/uL (ref 4.22–5.81)
WBC: 6.6 10*3/uL (ref 4.0–10.5)

## 2012-07-21 LAB — COMPREHENSIVE METABOLIC PANEL
AST: 24 U/L (ref 0–37)
CO2: 23 mEq/L (ref 19–32)
Calcium: 8.7 mg/dL (ref 8.4–10.5)
Chloride: 102 mEq/L (ref 96–112)
Creatinine, Ser: 1.14 mg/dL (ref 0.50–1.35)
GFR calc Af Amer: 86 mL/min — ABNORMAL LOW (ref >90)
GFR calc non Af Amer: 74 mL/min — ABNORMAL LOW (ref >90)
Glucose, Bld: 77 mg/dL (ref 70–99)
Total Bilirubin: 0.3 mg/dL (ref 0.3–1.2)

## 2012-07-21 LAB — RAPID URINE DRUG SCREEN, HOSP PERFORMED
Barbiturates: NOT DETECTED
Benzodiazepines: NOT DETECTED
Cocaine: NOT DETECTED

## 2012-07-21 LAB — ETHANOL: Alcohol, Ethyl (B): 221 mg/dL — ABNORMAL HIGH (ref 0–11)

## 2012-07-21 MED ORDER — LORAZEPAM 1 MG PO TABS: 0.0000 mg | ORAL_TABLET | Freq: Two times a day (BID) | ORAL | Status: DC

## 2012-07-21 MED ORDER — VITAMIN B-1 100 MG PO TABS
100.0000 mg | ORAL_TABLET | Freq: Every day | ORAL | Status: DC
  Administered 2012-07-21: 100 mg via ORAL
  Filled 2012-07-21: qty 1

## 2012-07-21 MED ORDER — SUCRALFATE 1 G PO TABS
1.0000 g | ORAL_TABLET | Freq: Four times a day (QID) | ORAL | Status: DC
  Administered 2012-07-21: 1 g via ORAL
  Filled 2012-07-21 (×4): qty 1

## 2012-07-21 MED ORDER — LORAZEPAM 2 MG/ML IJ SOLN: 1.0000 mg | Freq: Four times a day (QID) | INTRAMUSCULAR | Status: DC | PRN

## 2012-07-21 MED ORDER — PANTOPRAZOLE SODIUM 40 MG PO TBEC
40.0000 mg | DELAYED_RELEASE_TABLET | Freq: Every day | ORAL | Status: DC
  Administered 2012-07-21: 40 mg via ORAL
  Filled 2012-07-21: qty 1

## 2012-07-21 MED ORDER — THIAMINE HCL 100 MG/ML IJ SOLN: 100.0000 mg | Freq: Every day | INTRAMUSCULAR | Status: DC

## 2012-07-21 MED ORDER — ZOLPIDEM TARTRATE 5 MG PO TABS: 5.0000 mg | ORAL_TABLET | Freq: Every evening | ORAL | Status: DC | PRN

## 2012-07-21 MED ORDER — ADULT MULTIVITAMIN W/MINERALS CH
1.0000 | ORAL_TABLET | Freq: Every day | ORAL | Status: DC
  Administered 2012-07-21: 1 via ORAL
  Filled 2012-07-21: qty 1

## 2012-07-21 MED ORDER — SIMVASTATIN 10 MG PO TABS
10.0000 mg | ORAL_TABLET | Freq: Every day | ORAL | Status: DC
  Filled 2012-07-21: qty 1

## 2012-07-21 MED ORDER — GABAPENTIN 300 MG PO CAPS
300.0000 mg | ORAL_CAPSULE | Freq: Three times a day (TID) | ORAL | Status: DC
  Administered 2012-07-21: 300 mg via ORAL
  Filled 2012-07-21 (×3): qty 1

## 2012-07-21 MED ORDER — FOLIC ACID 1 MG PO TABS: 1.0000 mg | ORAL_TABLET | Freq: Every day | ORAL | Status: DC

## 2012-07-21 MED ORDER — ZIPRASIDONE HCL 40 MG PO CAPS: 40.0000 mg | ORAL_CAPSULE | Freq: Two times a day (BID) | ORAL | Status: DC

## 2012-07-21 MED ORDER — PAROXETINE HCL 20 MG PO TABS: 20.0000 mg | ORAL_TABLET | Freq: Every evening | ORAL | Status: DC

## 2012-07-21 MED ORDER — TRAZODONE HCL 150 MG PO TABS: 150.0000 mg | ORAL_TABLET | Freq: Every day | ORAL | Status: DC

## 2012-07-21 MED ORDER — LORAZEPAM 1 MG PO TABS: 1.0000 mg | ORAL_TABLET | Freq: Four times a day (QID) | ORAL | Status: DC | PRN

## 2012-07-21 MED ORDER — TRAZODONE HCL 100 MG PO TABS
150.0000 mg | ORAL_TABLET | Freq: Every day | ORAL | Status: DC
  Administered 2012-07-21: 150 mg via ORAL
  Filled 2012-07-21: qty 1

## 2012-07-21 MED ORDER — ALUM & MAG HYDROXIDE-SIMETH 200-200-20 MG/5ML PO SUSP: 30.0000 mL | ORAL | Status: DC | PRN

## 2012-07-21 MED ORDER — LORAZEPAM 1 MG PO TABS: 0.0000 mg | ORAL_TABLET | Freq: Four times a day (QID) | ORAL | Status: DC

## 2012-07-21 MED ORDER — NICOTINE 21 MG/24HR TD PT24: 21.0000 mg | MEDICATED_PATCH | Freq: Every day | TRANSDERMAL | Status: DC

## 2012-07-21 MED ORDER — ONDANSETRON HCL 4 MG PO TABS: 4.0000 mg | ORAL_TABLET | Freq: Three times a day (TID) | ORAL | Status: DC | PRN

## 2012-07-21 MED ORDER — FAMOTIDINE 20 MG PO TABS: 20.0000 mg | ORAL_TABLET | Freq: Two times a day (BID) | ORAL | Status: DC | PRN

## 2012-07-21 MED ORDER — IBUPROFEN 600 MG PO TABS: 600.0000 mg | ORAL_TABLET | Freq: Three times a day (TID) | ORAL | Status: DC | PRN

## 2012-07-21 NOTE — ED Notes (Signed)
Telepsych recommended discharge home with outpatient follow up.

## 2012-07-21 NOTE — ED Notes (Signed)
Pt says he feels much better now and people are safe around him and he himself is safe as he denies SI. Writer went over discharge paperwork with pt and he indicated full understanding. Bus pass given as well.

## 2012-07-21 NOTE — ED Provider Notes (Signed)
Medical screening examination/treatment/procedure(s) were performed by non-physician practitioner and as supervising physician I was immediately available for consultation/collaboration.  Gilda Crease, MD 07/21/12 [phone]

## 2012-07-21 NOTE — ED Provider Notes (Signed)
History     CSN: [id number]  Arrival date & time 07/21/12  0032   First MD Initiated Contact with Patient 07/21/12 0035      Chief Complaint  Patient presents with  . Medical Clearance    (Consider location/radiation/quality/duration/timing/severity/associated sxs/prior treatment) HPI Patient presents to the emergency department requesting help with his depression and alcohol abuse. He drinks all different kinds of liquor and denies using any  Illegal substances. Last time he had a drink was this evening. He says he is tried many times to quit drinking. He has been drinking since he was 47 years old. He has a past medical history positive for GERD, stent placement, cocaine abuse, bipolar disorder. He has not been on his medication for one month to 5 months. Patient is giving conflicting timeframes. He is supposed to be on Neurontin, Prilosec, pravastatin, Carafate and trazodone. He says that his wife passed away 6 years ago for breast cancer and when she was carried out of the house WENT one way and he went the other. He says today on Valentine's Day the depression has been exceptionally more challenging and doesn't know what else to do. He denies being suicidal or, homicidal or having hallucinations. He has gone 1-2 weeks without drink before and does not have shakes or seizures when he does not drink. He drinks and he is able to get access to alcohol. He has no plans to hurt himself or anybody else but admits that he feels weird outside the. He describes it as not feeling right about being around others feeling like others or not safer on him. He is unable to specify anything more clearly. Patient appears intoxicated but is answering questions appropriately.    Past Medical History  Diagnosis Date  . Chest pain, mid sternal   . CAD in native artery     a. Nonobstructive cath 11/2007;  b. Presented with ST elevation - Nonobstructive cath 08/2011  . GERD (gastroesophageal reflux disease)   .  Tobacco abuse   . Marijuana abuse     a. uses ~ 1x /wk or less  . History of cocaine abuse     a. quit ? 2009  . History of ETOH abuse     a. drinks 2 "40's" / wk  . Bradycardia     a. asymptomatic  . Bipolar disorder   . Syncope     a. 12/2010 - presumed to be vasovagal  . Abnormal ECG     a. early repolarization  . Coronary artery disease     Past Surgical History  Procedure Laterality Date  . Coronary angioplasty with stent placement      History reviewed. No pertinent family history.  History  Substance Use Topics  . Smoking status: Current Every Day Smoker -- 0.50 packs/day for 20 years    Types: Cigarettes  . Smokeless tobacco: Not on file     Comment: Smokes about 3 cigarettes/day  . Alcohol Use: Yes     Comment: 4-5 40oz/day      Review of Systems  Review of Systems  Gen: no weight loss, fevers, chills, night sweats  Eyes: no discharge or drainage, no occular pain or visual changes  Nose: no epistaxis or rhinorrhea  Mouth: no dental pain, no sore throat  Neck: no neck pain  Lungs:No wheezing, coughing or hemoptysis CV: no chest pain, palpitations, dependent edema or orthopnea  Abd: no abdominal pain, nausea, vomiting  GU: no dysuria or gross hematuria  MSK:  No abnormalities  Neuro: no headache, no focal neurologic deficits  Skin: no abnormalities Psyche: depressed, alcohol abuse, tearful   Allergies  Aspirin; Pepperoni; Tomato; and Tylenol  Home Medications   Current Outpatient Rx  Name  Route  Sig  Dispense  Refill  . famotidine (PEPCID) 20 MG tablet   Oral   Take 20 mg by mouth 2 (two) times daily as needed. For acid reflux         . folic acid (FOLVITE) 1 MG tablet   Oral   Take 1 tablet (1 mg total) by mouth daily.         Marland Kitchen gabapentin (NEURONTIN) 300 MG capsule   Oral   Take 300 mg by mouth 3 (three) times daily. For anxiety and pain.          . Multiple Vitamin (MULTIVITAMIN WITH MINERALS) TABS   Oral   Take 1 tablet by  mouth daily.         Marland Kitchen omeprazole (PRILOSEC) 20 MG capsule   Oral   Take 1 capsule (20 mg total) by mouth daily.   20 capsule   0   . pravastatin (PRAVACHOL) 20 MG tablet   Oral   Take 20 mg by mouth at bedtime.         . promethazine (PHENERGAN) 25 MG tablet   Oral   Take 25 mg by mouth every 6 (six) hours as needed. For nausea/vomiting         . sucralfate (CARAFATE) 1 G tablet   Oral   Take 1 tablet (1 g total) by mouth 4 (four) times daily.   28 tablet   0   . thiamine 100 MG tablet   Oral   Take 1 tablet (100 mg total) by mouth daily.         . traZODone (DESYREL) 150 MG tablet   Oral   Take 150 mg by mouth at bedtime.           BP 119/69  Pulse 69  Temp(Src) 98.1 F (36.7 C) (Oral)  Resp 18  SpO2 98%  Physical Exam  Nursing note and vitals reviewed. Constitutional: He appears well-developed and well-nourished. No distress.  Pt is underweight for his height  HENT:  Head: Normocephalic and atraumatic.  Eyes: Pupils are equal, round, and reactive to light.  Neck: Normal range of motion. Neck supple.  Cardiovascular: Normal rate and regular rhythm.   Pulmonary/Chest: Effort normal.  Abdominal: Soft.  Neurological: He is alert.  Skin: Skin is warm and dry.  Psychiatric: His mood appears not anxious. His affect is not angry, not blunt, not labile and not inappropriate. His speech is slurred. His speech is not rapid and/or pressured, not delayed and not tangential. He is not agitated, not aggressive, not hyperactive, not slowed, not withdrawn, not actively hallucinating and not combative. Cognition and memory are not impaired. He does not express impulsivity or inappropriate judgment. He exhibits a depressed mood. He expresses no homicidal and no suicidal ideation. He expresses no suicidal plans and no homicidal plans.  Pt speech is slightly slurred due to intoxication He is attentive.    ED Course  Procedures (including critical care time)  Labs  Reviewed  COMPREHENSIVE METABOLIC PANEL - Abnormal; Notable for the following:    GFR calc non Af Amer 74 (*)    GFR calc Af Amer 86 (*)    All other components within normal limits  ETHANOL - Abnormal; Notable for the following:  Alcohol, Ethyl (B) 221 (*)    All other components within normal limits  CBC WITH DIFFERENTIAL  URINE RAPID DRUG SCREEN (HOSP PERFORMED)   No results found.   1. Bipolar 1 disorder   2. Depression   3. Alcohol abuse       MDM  ACT consulted. Holding orders placed. Med Rec completed.   Labs reviewed. Alcohol is elevated.        Dorthula Matas, PA 07/21/12 [phone]

## 2012-07-21 NOTE — ED Provider Notes (Signed)
Telepsych consult obtained.  They state patient does not need inpatient psych treatment.  Recommend restarting his meds, will provide prescriptions as recommended for paxil 20mg  po qPM, geodon 40mg  po BID, trazodone 150mg  po qHS  Ethelda Chick, MD 07/21/12 1609

## 2012-07-21 NOTE — ED Notes (Signed)
Pt states he is struggling with depression and alcohol use  Pt states he has been off all of his meds for the past 4 to 5 months  Pt states he was not feeling right about being around others and felt he was not safe and those around him were not safe either  Pt denies SI/HI at this time

## 2012-07-21 NOTE — Progress Notes (Signed)
Telepsych recommended discharge home. CSW met with pt to confirm outpatient providers. Pt plans to follow up with [facility]. Pt requested transportation assistance, patient provided with bus pass.   Catha Gosselin, LCSWA  [phone] 07/21/2012 8:20pm

## 2012-07-21 NOTE — ED Notes (Signed)
Pt walked to discharge window by Clinical research associate.

## 2012-08-03 ENCOUNTER — Ambulatory Visit: Payer: Self-pay | Admitting: Physician Assistant

## 2012-09-11 ENCOUNTER — Emergency Department (HOSPITAL_COMMUNITY)
Admission: EM | Admit: 2012-09-11 | Discharge: 2012-09-11 | Disposition: A | Discharge status: Home / Self Care | Payer: Medicaid Other | Attending: Emergency Medicine | Admitting: Emergency Medicine

## 2012-09-11 ENCOUNTER — Encounter (HOSPITAL_COMMUNITY): Payer: Self-pay | Admitting: Emergency Medicine

## 2012-09-11 DIAGNOSIS — F1410 Cocaine abuse, uncomplicated: Secondary | ICD-10-CM | POA: Insufficient documentation

## 2012-09-11 DIAGNOSIS — Z79899 Other long term (current) drug therapy: Secondary | ICD-10-CM | POA: Insufficient documentation

## 2012-09-11 DIAGNOSIS — Z8679 Personal history of other diseases of the circulatory system: Secondary | ICD-10-CM | POA: Insufficient documentation

## 2012-09-11 DIAGNOSIS — R110 Nausea: Secondary | ICD-10-CM | POA: Insufficient documentation

## 2012-09-11 DIAGNOSIS — F17200 Nicotine dependence, unspecified, uncomplicated: Secondary | ICD-10-CM | POA: Insufficient documentation

## 2012-09-11 DIAGNOSIS — R1013 Epigastric pain: Secondary | ICD-10-CM | POA: Insufficient documentation

## 2012-09-11 DIAGNOSIS — I2510 Atherosclerotic heart disease of native coronary artery without angina pectoris: Secondary | ICD-10-CM | POA: Insufficient documentation

## 2012-09-11 DIAGNOSIS — K219 Gastro-esophageal reflux disease without esophagitis: Secondary | ICD-10-CM | POA: Insufficient documentation

## 2012-09-11 DIAGNOSIS — Z9861 Coronary angioplasty status: Secondary | ICD-10-CM | POA: Insufficient documentation

## 2012-09-11 DIAGNOSIS — F1210 Cannabis abuse, uncomplicated: Secondary | ICD-10-CM | POA: Insufficient documentation

## 2012-09-11 DIAGNOSIS — Z8659 Personal history of other mental and behavioral disorders: Secondary | ICD-10-CM | POA: Insufficient documentation

## 2012-09-11 LAB — COMPREHENSIVE METABOLIC PANEL
ALT: 30 U/L (ref 0–53)
AST: 44 U/L — ABNORMAL HIGH (ref 0–37)
CO2: 27 mEq/L (ref 19–32)
Chloride: 98 mEq/L (ref 96–112)
GFR calc non Af Amer: 83 mL/min — ABNORMAL LOW (ref >90)
Sodium: 134 mEq/L — ABNORMAL LOW (ref 135–145)
Total Bilirubin: 0.6 mg/dL (ref 0.3–1.2)

## 2012-09-11 LAB — CBC WITH DIFFERENTIAL/PLATELET
Basophils Absolute: 0 10*3/uL (ref 0.0–0.1)
HCT: 39.6 % (ref 39.0–52.0)
Lymphocytes Relative: 31 % (ref 12–46)
Neutro Abs: 2.3 10*3/uL (ref 1.7–7.7)
Neutrophils Relative %: 50 % (ref 43–77)
Platelets: 192 10*3/uL (ref 150–400)
RDW: 13.7 % (ref 11.5–15.5)
WBC: 4.6 10*3/uL (ref 4.0–10.5)

## 2012-09-11 MED ORDER — PANTOPRAZOLE SODIUM 40 MG IV SOLR
40.0000 mg | Freq: Once | INTRAVENOUS | Status: AC
  Administered 2012-09-11: 40 mg via INTRAVENOUS
  Filled 2012-09-11: qty 40

## 2012-09-11 MED ORDER — ONDANSETRON HCL 4 MG/2ML IJ SOLN
4.0000 mg | Freq: Once | INTRAMUSCULAR | Status: AC
  Administered 2012-09-11: 4 mg via INTRAVENOUS
  Filled 2012-09-11: qty 2

## 2012-09-11 MED ORDER — GI COCKTAIL ~~LOC~~: 30.0000 mL | Freq: Once | ORAL | Status: AC

## 2012-09-11 MED ORDER — PANTOPRAZOLE SODIUM 40 MG PO TBEC: 40.0000 mg | DELAYED_RELEASE_TABLET | Freq: Every day | ORAL | Status: DC

## 2012-09-11 MED ORDER — ONDANSETRON 4 MG PO TBDP: ORAL_TABLET | ORAL | Status: DC

## 2012-09-11 MED ORDER — TRAMADOL HCL 50 MG PO TABS: 50.0000 mg | ORAL_TABLET | Freq: Four times a day (QID) | ORAL | Status: DC | PRN

## 2012-09-11 MED ORDER — GI COCKTAIL ~~LOC~~
30.0000 mL | Freq: Once | ORAL | Status: AC
  Administered 2012-09-11: 30 mL via ORAL
  Filled 2012-09-11: qty 30

## 2012-09-11 MED ORDER — PROMETHAZINE HCL 25 MG PO TABS: 25.0000 mg | ORAL_TABLET | Freq: Four times a day (QID) | ORAL | Status: DC | PRN

## 2012-09-11 NOTE — ED Provider Notes (Signed)
History     CSN: [id number]  Arrival date & time 09/11/12  0222   First MD Initiated Contact with Patient 09/11/12 [phone]      Chief Complaint  Patient presents with  . Gastrophageal Reflux    (Consider location/radiation/quality/duration/timing/severity/associated sxs/prior treatment) HPI Pt with history of GERD p/w epigastric pain starting this morning after breakfast. States he ate BBQ chicken and that exacerbated his condition. He has been out of protonix now for several weeks. +nausea. No vomiting, melena. No CP, SOB, fever chills.  Past Medical History  Diagnosis Date  . Chest pain, mid sternal   . CAD in native artery     a. Nonobstructive cath 11/2007;  b. Presented with ST elevation - Nonobstructive cath 08/2011  . GERD (gastroesophageal reflux disease)   . Tobacco abuse   . Marijuana abuse     a. uses ~ 1x /wk or less  . History of cocaine abuse     a. quit ? 2009  . History of ETOH abuse     a. drinks 2 "40's" / wk  . Bradycardia     a. asymptomatic  . Bipolar disorder   . Syncope     a. 12/2010 - presumed to be vasovagal  . Abnormal ECG     a. early repolarization  . Coronary artery disease     Past Surgical History  Procedure Laterality Date  . Coronary angioplasty with stent placement      No family history on file.  History  Substance Use Topics  . Smoking status: Current Every Day Smoker -- 0.50 packs/day for 20 years    Types: Cigarettes  . Smokeless tobacco: Not on file     Comment: Smokes about 3 cigarettes/day  . Alcohol Use: Yes     Comment: 4-5 40oz/day      Review of Systems  Constitutional: Negative for fever and chills.  Respiratory: Negative for shortness of breath.   Cardiovascular: Negative for chest pain.  Gastrointestinal: Positive for nausea. Negative for vomiting, diarrhea, constipation, blood in stool and anal bleeding.  Skin: Negative for rash and wound.  All other systems reviewed and are negative.    Allergies   Aspirin; Pepperoni; Tomato; and Tylenol  Home Medications   Current Outpatient Rx  Name  Route  Sig  Dispense  Refill  . ondansetron (ZOFRAN ODT) 4 MG disintegrating tablet      4mg  ODT q4 hours prn nausea/vomit   4 tablet   0   . pantoprazole (PROTONIX) 40 MG tablet   Oral   Take 1 tablet (40 mg total) by mouth daily.   30 tablet   0   . traMADol (ULTRAM) 50 MG tablet   Oral   Take 1 tablet (50 mg total) by mouth every 6 (six) hours as needed for pain.   15 tablet   0     BP 124/73  Pulse 53  Temp(Src) 98 F (36.7 C) (Oral)  Resp 20  SpO2 97%  Physical Exam  Nursing note and vitals reviewed. Constitutional: He is oriented to person, place, and time. He appears well-developed and well-nourished. No distress.  HENT:  Head: Normocephalic and atraumatic.  Mouth/Throat: Oropharynx is clear and moist.  Eyes: EOM are normal. Pupils are equal, round, and reactive to light.  Neck: Normal range of motion. Neck supple.  Cardiovascular: Normal rate and regular rhythm.   Pulmonary/Chest: Effort normal and breath sounds normal. No respiratory distress. He has no wheezes. He has no rales.  Abdominal: Soft. Bowel sounds are normal. He exhibits no distension and no mass. There is tenderness (TTP epigastrum. ). There is no rebound and no guarding.  Musculoskeletal: Normal range of motion. He exhibits no edema and no tenderness.  Neurological: He is alert and oriented to person, place, and time.  Skin: Skin is warm and dry. No rash noted. No erythema.  Psychiatric: He has a normal mood and affect. His behavior is normal.    ED Course  Procedures (including critical care time)  Labs Reviewed  CBC WITH DIFFERENTIAL - Abnormal; Notable for the following:    MCHC 36.6 (*)    Monocytes Relative 15 (*)    All other components within normal limits  COMPREHENSIVE METABOLIC PANEL - Abnormal; Notable for the following:    Sodium 134 (*)    AST 44 (*)    GFR calc non Af Amer 83 (*)     All other components within normal limits  LIPASE, BLOOD - Abnormal; Notable for the following:    Lipase 71 (*)    All other components within normal limits  TROPONIN I   No results found.   1. GERD (gastroesophageal reflux disease)       Date: 09/11/2012  Rate: 52  Rhythm: sinus bradycardia  QRS Axis: normal  Intervals: normal  ST/T Wave abnormalities: early repolarization  Conduction Disutrbances:none  Narrative Interpretation:   Old EKG Reviewed: unchanged   MDM   Pt resting comfortably. D/c home. F/u with Gi. Return for concerns       Loren Racer, MD 09/11/12 [phone]

## 2012-09-11 NOTE — ED Notes (Signed)
Per EMS pt called complain of epi gastric pain started this morning after breakfast, was suppose to be taking Omeprazole and ASA but not taking them for 3 months now. Pt is a transit

## 2012-09-22 ENCOUNTER — Encounter: Payer: Self-pay | Admitting: Physician Assistant

## 2012-10-17 ENCOUNTER — Emergency Department (HOSPITAL_COMMUNITY): Payer: Medicaid Other

## 2012-10-17 ENCOUNTER — Emergency Department (HOSPITAL_COMMUNITY)
Admission: EM | Admit: 2012-10-17 | Discharge: 2012-10-18 | Disposition: A | Discharge status: Home / Self Care | Payer: Medicaid Other | Attending: Emergency Medicine | Admitting: Emergency Medicine

## 2012-10-17 ENCOUNTER — Encounter (HOSPITAL_COMMUNITY): Payer: Self-pay | Admitting: *Deleted

## 2012-10-17 DIAGNOSIS — J069 Acute upper respiratory infection, unspecified: Secondary | ICD-10-CM | POA: Insufficient documentation

## 2012-10-17 DIAGNOSIS — Z8659 Personal history of other mental and behavioral disorders: Secondary | ICD-10-CM | POA: Insufficient documentation

## 2012-10-17 DIAGNOSIS — Z8701 Personal history of pneumonia (recurrent): Secondary | ICD-10-CM | POA: Insufficient documentation

## 2012-10-17 DIAGNOSIS — F329 Major depressive disorder, single episode, unspecified: Secondary | ICD-10-CM | POA: Insufficient documentation

## 2012-10-17 DIAGNOSIS — F32A Depression, unspecified: Secondary | ICD-10-CM

## 2012-10-17 DIAGNOSIS — F1210 Cannabis abuse, uncomplicated: Secondary | ICD-10-CM | POA: Insufficient documentation

## 2012-10-17 DIAGNOSIS — F1020 Alcohol dependence, uncomplicated: Secondary | ICD-10-CM

## 2012-10-17 DIAGNOSIS — R45851 Suicidal ideations: Secondary | ICD-10-CM

## 2012-10-17 DIAGNOSIS — F17200 Nicotine dependence, unspecified, uncomplicated: Secondary | ICD-10-CM | POA: Insufficient documentation

## 2012-10-17 DIAGNOSIS — Z9861 Coronary angioplasty status: Secondary | ICD-10-CM | POA: Insufficient documentation

## 2012-10-17 DIAGNOSIS — F1410 Cocaine abuse, uncomplicated: Secondary | ICD-10-CM | POA: Insufficient documentation

## 2012-10-17 DIAGNOSIS — R071 Chest pain on breathing: Secondary | ICD-10-CM | POA: Insufficient documentation

## 2012-10-17 DIAGNOSIS — Z8719 Personal history of other diseases of the digestive system: Secondary | ICD-10-CM | POA: Insufficient documentation

## 2012-10-17 DIAGNOSIS — I2510 Atherosclerotic heart disease of native coronary artery without angina pectoris: Secondary | ICD-10-CM | POA: Insufficient documentation

## 2012-10-17 DIAGNOSIS — J3489 Other specified disorders of nose and nasal sinuses: Secondary | ICD-10-CM | POA: Insufficient documentation

## 2012-10-17 DIAGNOSIS — R05 Cough: Secondary | ICD-10-CM | POA: Insufficient documentation

## 2012-10-17 DIAGNOSIS — F1010 Alcohol abuse, uncomplicated: Secondary | ICD-10-CM | POA: Insufficient documentation

## 2012-10-17 DIAGNOSIS — R059 Cough, unspecified: Secondary | ICD-10-CM | POA: Insufficient documentation

## 2012-10-17 DIAGNOSIS — Z8679 Personal history of other diseases of the circulatory system: Secondary | ICD-10-CM | POA: Insufficient documentation

## 2012-10-17 DIAGNOSIS — F3289 Other specified depressive episodes: Secondary | ICD-10-CM | POA: Insufficient documentation

## 2012-10-17 HISTORY — DX: Pneumonia, unspecified organism: J18.9

## 2012-10-17 LAB — CBC WITH DIFFERENTIAL/PLATELET
Basophils Absolute: 0 10*3/uL (ref 0.0–0.1)
Eosinophils Relative: 0 % (ref 0–5)
HCT: 39.1 % (ref 39.0–52.0)
Hemoglobin: 14.3 g/dL (ref 13.0–17.0)
Lymphocytes Relative: 25 % (ref 12–46)
MCHC: 36.6 g/dL — ABNORMAL HIGH (ref 30.0–36.0)
MCV: 87.9 fL (ref 78.0–100.0)
Monocytes Absolute: 0.7 10*3/uL (ref 0.1–1.0)
Monocytes Relative: 7 % (ref 3–12)
RDW: 13.2 % (ref 11.5–15.5)
WBC: 10.1 10*3/uL (ref 4.0–10.5)

## 2012-10-17 LAB — POCT I-STAT TROPONIN I: Troponin i, poc: 0.02 ng/mL (ref 0.00–0.08)

## 2012-10-17 LAB — COMPREHENSIVE METABOLIC PANEL
AST: 29 U/L (ref 0–37)
BUN: 7 mg/dL (ref 6–23)
CO2: 22 mEq/L (ref 19–32)
Calcium: 9.2 mg/dL (ref 8.4–10.5)
Creatinine, Ser: 1.09 mg/dL (ref 0.50–1.35)
GFR calc Af Amer: >90 mL/min (ref >90)
GFR calc non Af Amer: 79 mL/min — ABNORMAL LOW (ref >90)
Total Bilirubin: 0.4 mg/dL (ref 0.3–1.2)

## 2012-10-17 LAB — ETHANOL: Alcohol, Ethyl (B): 217 mg/dL — ABNORMAL HIGH (ref 0–11)

## 2012-10-17 LAB — URINALYSIS, ROUTINE W REFLEX MICROSCOPIC
Bilirubin Urine: NEGATIVE
Glucose, UA: NEGATIVE mg/dL
Hgb urine dipstick: NEGATIVE
Ketones, ur: NEGATIVE mg/dL
Leukocytes, UA: NEGATIVE
Nitrite: NEGATIVE
Protein, ur: NEGATIVE mg/dL
Specific Gravity, Urine: 1.017 (ref 1.005–1.030)
Urobilinogen, UA: 0.2 mg/dL (ref 0.0–1.0)
pH: 5 (ref 5.0–8.0)

## 2012-10-17 LAB — ACETAMINOPHEN LEVEL: Acetaminophen (Tylenol), Serum: <15 ug/mL (ref 10–30)

## 2012-10-17 LAB — RAPID URINE DRUG SCREEN, HOSP PERFORMED
Barbiturates: NOT DETECTED
Cocaine: NOT DETECTED
Opiates: NOT DETECTED
Tetrahydrocannabinol: POSITIVE — AB

## 2012-10-17 LAB — SALICYLATE LEVEL: Salicylate Lvl: <2 mg/dL — ABNORMAL LOW (ref 2.8–20.0)

## 2012-10-17 MED ORDER — QUETIAPINE FUMARATE 100 MG PO TABS: 100.0000 mg | ORAL_TABLET | Freq: Every day | ORAL | Status: DC

## 2012-10-17 MED ORDER — GUAIFENESIN ER 600 MG PO TB12
600.0000 mg | ORAL_TABLET | Freq: Two times a day (BID) | ORAL | Status: DC | PRN
  Filled 2012-10-17 (×2): qty 1

## 2012-10-17 MED ORDER — ESCITALOPRAM OXALATE 10 MG PO TABS: 10.0000 mg | ORAL_TABLET | Freq: Every day | ORAL | Status: DC

## 2012-10-17 MED ORDER — NICOTINE 21 MG/24HR TD PT24
21.0000 mg | MEDICATED_PATCH | Freq: Every day | TRANSDERMAL | Status: DC
  Administered 2012-10-17: 21 mg via TRANSDERMAL
  Filled 2012-10-17 (×2): qty 1

## 2012-10-17 MED ORDER — QUETIAPINE FUMARATE ER 300 MG PO TB24
300.0000 mg | ORAL_TABLET | Freq: Every day | ORAL | Status: DC
  Administered 2012-10-17: 300 mg via ORAL
  Filled 2012-10-17: qty 1

## 2012-10-17 MED ORDER — LORAZEPAM 1 MG PO TABS
1.0000 mg | ORAL_TABLET | Freq: Three times a day (TID) | ORAL | Status: DC | PRN
  Administered 2012-10-17: 1 mg via ORAL
  Filled 2012-10-17: qty 1

## 2012-10-17 MED ORDER — ALUM & MAG HYDROXIDE-SIMETH 200-200-20 MG/5ML PO SUSP
30.0000 mL | ORAL | Status: DC | PRN
  Administered 2012-10-17: 30 mL via ORAL
  Filled 2012-10-17: qty 30

## 2012-10-17 MED ORDER — ZOLPIDEM TARTRATE 5 MG PO TABS: 5.0000 mg | ORAL_TABLET | Freq: Every evening | ORAL | Status: DC | PRN

## 2012-10-17 MED ORDER — ONDANSETRON HCL 4 MG PO TABS: 4.0000 mg | ORAL_TABLET | Freq: Three times a day (TID) | ORAL | Status: DC | PRN

## 2012-10-17 MED ORDER — BENZONATATE 100 MG PO CAPS
200.0000 mg | ORAL_CAPSULE | Freq: Three times a day (TID) | ORAL | Status: DC | PRN
  Administered 2012-10-17 (×2): 200 mg via ORAL
  Filled 2012-10-17 (×3): qty 2

## 2012-10-17 NOTE — ED Notes (Signed)
The pt is asleep sitter at the bedside 

## 2012-10-17 NOTE — ED Notes (Signed)
No changes from previous assessment. °

## 2012-10-17 NOTE — ED Provider Notes (Addendum)
Tele-psychiatry second opinion recommends discharge - recommend medications, we'll add these medications and the patient followup as an outpatient.    Jones Skene, MD 10/17/12 1610  Jones Skene, MD 10/17/12 2342

## 2012-10-17 NOTE — ED Notes (Addendum)
Pt states he has had flu like symptoms for the past 2 days.  Has tried OTC medication without relief.  States he has had a cough and been congested and coughing up grey flem.  Denies N/V/D.

## 2012-10-17 NOTE — ED Notes (Signed)
Pt still asleep 

## 2012-10-17 NOTE — ED Notes (Signed)
Pt states that he has been coughing up thick green sputum. pt states CP for the past 3 days. Pt states that his pain gets worse when he takes a deep breath. Pt states also worse when he coughs. Pt states that he has not felt great since the last couple of days like the flu or URI.

## 2012-10-17 NOTE — ED Notes (Signed)
Belongings inventoried and placed in  bind

## 2012-10-17 NOTE — ED Notes (Signed)
Telepsych completed.  

## 2012-10-17 NOTE — ED Notes (Signed)
Pt changed into scrubs, security called to wand pt, house coverage aware of need for sitter.

## 2012-10-17 NOTE — BH Assessment (Signed)
[facility] Assessment Progress Note      Update:  Pt had telepsych recommending inpatient treatment by Dr. Berlin Hun with [facility].  Per EDP Silverio Lay, another telepsych to be ordered on the pt when [facility] (telepsychiatry) has a shift change at 1800., as it is suspected that pt is malingering.  Telepsych paperwork completed and faxed by writer and discussed with EDP Bonk, who is in agreement.  Pt pending telepsych.  ACT not to assess pt unless inpatient treatment recommended.

## 2012-10-17 NOTE — ED Provider Notes (Signed)
History     CSN: [id number]  Arrival date & time 10/17/12  0118   First MD Initiated Contact with Patient 10/17/12 0357      Chief Complaint  Patient presents with  . Chest Pain  . Suicidal    pt decided that he was suicidal after waiting for an hour and a half    (Consider location/radiation/quality/duration/timing/severity/associated sxs/prior treatment) HPI 47 yo male presents to the ER with 2 complaints.  Pt here for depression/suicidal thoughts.  He reports he has been off his medications for bipolar for some time.  He has been self treating with alcohol.  Pt has had thoughts of jumping off buildings.  He reports being under stress from family and legal problems.  He reports prior h/o suicide attempts.    Pt also here for cough and chest pain.  He reports 2 days of nasal congestion, sinus drainage, cough.  Pt is 1 ppd smoker.  No fevers.  No prior h/o asthma/copd.  Some green sputum.  Pt tried otc cold medication without improvement.  Pain in chest wall with coughing. Past Medical History  Diagnosis Date  . Chest pain, mid sternal   . CAD in native artery     a. Nonobstructive cath 11/2007;  b. Presented with ST elevation - Nonobstructive cath 08/2011  . GERD (gastroesophageal reflux disease)   . Tobacco abuse   . Marijuana abuse     a. uses ~ 1x /wk or less  . History of cocaine abuse     a. quit ? 2009  . History of ETOH abuse     a. drinks 2 "40's" / wk  . Bradycardia     a. asymptomatic  . Bipolar disorder   . Syncope     a. 12/2010 - presumed to be vasovagal  . Abnormal ECG     a. early repolarization  . Coronary artery disease   . Pneumonia     Past Surgical History  Procedure Laterality Date  . Coronary angioplasty with stent placement      History reviewed. No pertinent family history.  History  Substance Use Topics  . Smoking status: Current Every Day Smoker -- 0.50 packs/day for 20 years    Types: Cigarettes  . Smokeless tobacco: Not on file   Comment: Smokes about 3 cigarettes/day  . Alcohol Use: Yes     Comment: 4-5 40oz/day      Review of Systems  All other systems reviewed and are negative.    Allergies  Aspirin; Pepperoni; Tomato; and Tylenol  Home Medications  No current outpatient prescriptions on file.  BP 110/69  Pulse 84  Temp(Src) 98.4 F (36.9 C) (Oral)  Resp 18  SpO2 93%  Physical Exam  Nursing note and vitals reviewed. Constitutional: He is oriented to person, place, and time. He appears well-developed and well-nourished.  HENT:  Head: Normocephalic and atraumatic.  Right Ear: External ear normal.  Left Ear: External ear normal.  Mouth/Throat: Oropharynx is clear and moist.  Nasal congestion  Eyes: Conjunctivae and EOM are normal. Pupils are equal, round, and reactive to light.  Neck: Normal range of motion. Neck supple. No JVD present. No tracheal deviation present. No thyromegaly present.  Cardiovascular: Normal rate, regular rhythm, normal heart sounds and intact distal pulses.  Exam reveals no gallop and no friction rub.   No murmur heard. Pulmonary/Chest: Effort normal and breath sounds normal. No stridor. No respiratory distress. He has no wheezes. He has no rales. He exhibits no  tenderness.  cough  Abdominal: Soft. Bowel sounds are normal. He exhibits no distension and no mass. There is no tenderness. There is no rebound and no guarding.  Musculoskeletal: Normal range of motion. He exhibits no edema and no tenderness.  Lymphadenopathy:    He has no cervical adenopathy.  Neurological: He is alert and oriented to person, place, and time. He exhibits normal muscle tone. Coordination normal.  Skin: Skin is warm and dry. No rash noted. No erythema. No pallor.  Psychiatric: His behavior is normal.  Suicidal, tearful, flat affect    ED Course  Procedures (including critical care time)  Labs Reviewed  CBC WITH DIFFERENTIAL - Abnormal; Notable for the following:    MCHC 36.6 (*)    All  other components within normal limits  COMPREHENSIVE METABOLIC PANEL - Abnormal; Notable for the following:    GFR calc non Af Amer 79 (*)    All other components within normal limits  ETHANOL - Abnormal; Notable for the following:    Alcohol, Ethyl (B) 217 (*)    All other components within normal limits  SALICYLATE LEVEL - Abnormal; Notable for the following:    Salicylate Lvl <2.0 (*)    All other components within normal limits  ACETAMINOPHEN LEVEL  URINE RAPID DRUG SCREEN (HOSP PERFORMED)  POCT I-STAT TROPONIN I   Dg Chest 2 View  10/17/2012  *RADIOLOGY REPORT*  Clinical Data: Chest pain, cough.  CHEST - 2 VIEW  Comparison: 06/11/2012  Findings: Heart and mediastinal contours are within normal limits. No focal opacities or effusions.  No acute bony abnormality.  IMPRESSION: No active cardiopulmonary disease.   Original Report Authenticated By: Charlett Nose, M.D.     Date: 10/16/2012  Rate: 94  Rhythm: normal sinus rhythm  QRS Axis: normal  Intervals: normal  ST/T Wave abnormalities: normal  Conduction Disutrbances:none  Narrative Interpretation:   Old EKG Reviewed: none available    1. Upper respiratory infection   2. Alcohol dependence   3. Depression   4. Suicidal thoughts       MDM  47 yo male with c/o chest pain after cough for 2 days, no fever, no pna seen on cxr, +smoker.  Seems most c/w URI.  Pt also c/o suicidal thought of jumping off a building.  He reports prior SI.  Very circular thinking, reports stress from family, court cases, alcohol abuse.  Medically cleared, will treat cough with mucinex and tessalon.  Psych holding orders written.  ACT made aware.        Olivia Mackie, MD 10/17/12 2059

## 2012-10-17 NOTE — ED Notes (Signed)
Pt states he is a mental patient as well- pt refuses to elaborate about this matter.  Pt admits to suicide ideation with a plan, states he would go to somewhere and jump off of it, pt states he is feeling hopeless.  Denies any homicidal ideations.  Pt tearful upon RN assessment.  Pt states he just needs some help.  Charge nurse aware of need for sitter.  Will closely monitor pt.

## 2012-10-17 NOTE — ED Notes (Signed)
PT states, "I am going through something and my mind is not right. I can not confirm or deny if I will hurt myself. I am not safe to be around right now. I am a mental pt. I have been going something out there and my body is too. I am not safe" pt is requesting mental health evaluation.

## 2012-10-17 NOTE — ED Notes (Signed)
Telepsych consult at this time.

## 2012-10-17 NOTE — ED Notes (Signed)
Pt up to the br 

## 2012-10-17 NOTE — ED Provider Notes (Signed)
Telepsych saw patient. Recommend psych admission. However, patient has been in and out of jail. There is a concern for malingering. Will get second opinion from another telepsych.   Richardean Canal, MD 10/17/12 [phone]

## 2012-10-17 NOTE — ED Provider Notes (Addendum)
[name] [name] is a 47 y.o. male came in last night for vague suicidal thoughts. Has upcoming court case. Will get telepsych for eval today. No issues as per nursing overnight.    Richardean Canal, MD 10/17/12 4098  Richardean Canal, MD 10/17/12 [phone]

## 2012-10-17 NOTE — ED Notes (Signed)
Telepsych in process 

## 2012-10-17 NOTE — ED Notes (Signed)
Pt is still sleeping.  Sitter at the bedside.  His dinner tray is   here

## 2012-10-18 NOTE — ED Notes (Signed)
Please refer to Paper chart for information Re: this Pt. During Downtime.

## 2013-02-27 ENCOUNTER — Encounter: Payer: Self-pay | Admitting: Physician Assistant

## 2013-02-27 NOTE — Progress Notes (Signed)
This encounter was created in error - please disregard.

## 2013-03-06 ENCOUNTER — Encounter: Payer: Self-pay | Admitting: Physician Assistant

## 2013-03-14 ENCOUNTER — Ambulatory Visit (HOSPITAL_COMMUNITY)
Admission: RE | Admit: 2013-03-14 | Discharge: 2013-03-14 | Disposition: A | Discharge status: Home / Self Care | Payer: Medicaid Other | Attending: Psychiatry | Admitting: Psychiatry

## 2013-03-14 NOTE — BH Assessment (Signed)
Assessment Note  [name] [name] is an 47 y.o. male. Pt presents voluntarily to [facility] as a walk-in. Pt denies SI and HI. He denies [facility] and no delusions noted. Pt sts he got into an argument with his roommate two days ago and has been homeless since that time. Per chart review, pt was inpt at [facility] in 2009 and 2012. Pt sts he abuses alcohol but denies using other drugs. Current stressors include homelessness and alcohol abuse. Pt sts he went to [facility] in Sept for monthly appt but he didn't get his psych meds at that time. Pt asks how long he would be able to stay at [facility]. Writer explained that [facility] is short-term facility and Clinical research associate would have to meet criteria in order to be eligible for a bed. He says, "I want to get on my medications and get back to a happy life, seeing my grandkids again." Pt says his fiance of 7 years is supportive. Pt says he was at [facility] for 30 days approx. two years ago for alcohol abuse. Has also been inpt at [facility]. He endorses depressed mood with isolating, loss of interest in usual pleasures, anger/irritability and decreased concentration. Writer reviewed pt with Serena Colonel NP who recommended pt to given info re: [facility] IOP programs. Writer provided info to pt in addition to homeless resources. Pt declined [facility].   Axis I: Alcohol Abuse  Axis II: Deferred Axis III:  Past Medical History  Diagnosis Date  . Chest pain, mid sternal   . CAD in native artery     a. Nonobstructive cath 11/2007;  b. Presented with ST elevation - Nonobstructive cath 08/2011  . GERD (gastroesophageal reflux disease)   . Tobacco abuse   . Marijuana abuse     a. uses ~ 1x /wk or less  . History of cocaine abuse     a. quit ? 2009  . History of ETOH abuse     a. drinks 2 "40's" / wk  . Bradycardia     a. asymptomatic  . Bipolar disorder   . Syncope     a. 12/2010 - presumed to be vasovagal  . Abnormal ECG     a. early repolarization  . Coronary artery disease   . Pneumonia     Axis IV: economic problems, housing problems, other psychosocial or environmental problems and problems related to social environment Axis V: 51-60 moderate symptoms  Past Medical History:  Past Medical History  Diagnosis Date  . Chest pain, mid sternal   . CAD in native artery     a. Nonobstructive cath 11/2007;  b. Presented with ST elevation - Nonobstructive cath 08/2011  . GERD (gastroesophageal reflux disease)   . Tobacco abuse   . Marijuana abuse     a. uses ~ 1x /wk or less  . History of cocaine abuse     a. quit ? 2009  . History of ETOH abuse     a. drinks 2 "40's" / wk  . Bradycardia     a. asymptomatic  . Bipolar disorder   . Syncope     a. 12/2010 - presumed to be vasovagal  . Abnormal ECG     a. early repolarization  . Coronary artery disease   . Pneumonia     Past Surgical History  Procedure Laterality Date  . Coronary angioplasty with stent placement      Family History: No family history on file.  Social History:  reports that  he has been smoking Cigarettes.  He has a 10 pack-year smoking history. He does not have any smokeless tobacco history on file. He reports that he drinks alcohol. He reports that he uses illicit drugs (Cocaine and Marijuana).  Additional Social History:     CIWA:   COWS:    Allergies:  Allergies  Allergen Reactions  . Aspirin     Aggravates ulcer. "Causes chest pain."  . Pepperoni [Pickled Meat] Other (See Comments)    aggrevates ulcer  . Tomato Other (See Comments)    Foods with tomato sauce aggrevate ulcers  . Tylenol [Acetaminophen]     Pt reports hx of ulcers    Home Medications:  (Not in a hospital admission)  OB/GYN Status:  No LMP for male patient.  General Assessment Data Location of Assessment: [facility] Services Is this a Tele or Face-to-Face Assessment?: Face-to-Face Is this an Initial Assessment or a Re-assessment for this encounter?: Initial Assessment Living Arrangements: Other (Comment)  (homeless) Can pt return to current living arrangement?: Yes Admission Status: Voluntary Is patient capable of signing voluntary admission?: Yes Transfer from: Home Referral Source: Self/Family/Friend     [facility] Crisis Care Plan Living Arrangements: Other (Comment) (homeless)  Education Status Is patient currently in school?: No Highest grade of school patient has completed: 10   Risk to self Suicidal Ideation: No Suicidal Intent: No Is patient at risk for suicide?: No Suicidal Plan?: No Access to Means: No What has been your use of drugs/alcohol within the last 12 months?: 4 x alcohol weekly Previous Attempts/Gestures: Yes How many times?: 1 (mid 1990s) Other Self Harm Risks: none Triggers for Past Attempts: Unknown Intentional Self Injurious Behavior: None Family Suicide History: No (fam hx of MI and SA including pt's mother) Recent stressful life event(s): Other (Comment);Financial Problems (recent homelessness, on fixed income) Persecutory voices/beliefs?: No Depression: Yes Depression Symptoms: Feeling angry/irritable;Loss of interest in usual pleasures;Isolating (decreased concentration) Substance abuse history and/or treatment for substance abuse?: Yes Suicide prevention information given to non-admitted patients: Not applicable  Risk to Others Homicidal Ideation: No Thoughts of Harm to Others: No Current Homicidal Intent: No Current Homicidal Plan: No Access to Homicidal Means: No Identified Victim: none History of harm to others?: No Assessment of Violence: None Noted Violent Behavior Description: pt denies hx of violence Does patient have access to weapons?: No Criminal Charges Pending?: No (pt on probation but no criminal charges pending) Does patient have a court date: No  Psychosis Hallucinations: None noted Delusions: None noted  Mental Status Report Appear/Hygiene: Disheveled Eye Contact: Good Motor Activity: Freedom of movement Speech:  Logical/coherent;Soft Level of Consciousness: Alert Mood: Depressed;Sad;Anhedonia Affect: Appropriate to circumstance;Depressed Anxiety Level: Minimal Thought Processes: Coherent;Relevant Judgement: Unimpaired Orientation: Person;Place;Time;Situation Obsessive Compulsive Thoughts/Behaviors: None  Cognitive Functioning Concentration: Decreased Memory: Recent Intact;Remote Intact IQ: Average Insight: Fair Impulse Control: Fair Appetite: Fair Sleep: No Change Total Hours of Sleep: 5 Vegetative Symptoms: None  ADLScreening [facility] Assessment Services) Patient's cognitive ability adequate to safely complete daily activities?: Yes Patient able to express need for assistance with ADLs?: Yes Independently performs ADLs?: Yes (appropriate for developmental age)  Prior Inpatient Therapy Prior Inpatient Therapy: Yes Prior Therapy Dates: 2009, 2012 & 2013 Prior Therapy Facilty/Provider(s): [facility] Reason for Treatment: depression & alcohol abuse  Prior Outpatient Therapy Prior Outpatient Therapy: Yes Prior Therapy Dates: currently Prior Therapy Facilty/Provider(s): [facility]  Reason for Treatment: med management  ADL Screening (condition at time of admission) Patient's cognitive ability adequate to safely complete  daily activities?: Yes Patient able to express need for assistance with ADLs?: Yes Independently performs ADLs?: Yes (appropriate for developmental age)                  Additional Information 1:1 In Past 12 Months?: No CIRT Risk: No Elopement Risk: No Does patient have medical clearance?: Yes     Disposition:  Disposition Initial Assessment Completed for this Encounter: Yes Disposition of Patient: Other dispositions;Referred to Other disposition(s): Other (Comment) (followup with Vesta Mixer, given [facility] IOP info & homeless shelter)  On Site Evaluation by:   Reviewed with Physician:    Donnamarie Rossetti P 03/14/2013 2:42 PM

## 2013-09-23 ENCOUNTER — Emergency Department (HOSPITAL_COMMUNITY)
Admission: EM | Admit: 2013-09-23 | Discharge: 2013-09-23 | Disposition: A | Discharge status: Home / Self Care | Payer: Medicaid Other | Attending: Emergency Medicine | Admitting: Emergency Medicine

## 2013-09-23 ENCOUNTER — Encounter (HOSPITAL_COMMUNITY): Payer: Self-pay | Admitting: Emergency Medicine

## 2013-09-23 DIAGNOSIS — Z9861 Coronary angioplasty status: Secondary | ICD-10-CM | POA: Insufficient documentation

## 2013-09-23 DIAGNOSIS — Z79899 Other long term (current) drug therapy: Secondary | ICD-10-CM | POA: Insufficient documentation

## 2013-09-23 DIAGNOSIS — F319 Bipolar disorder, unspecified: Secondary | ICD-10-CM | POA: Insufficient documentation

## 2013-09-23 DIAGNOSIS — R45851 Suicidal ideations: Secondary | ICD-10-CM | POA: Insufficient documentation

## 2013-09-23 DIAGNOSIS — Z8719 Personal history of other diseases of the digestive system: Secondary | ICD-10-CM | POA: Insufficient documentation

## 2013-09-23 DIAGNOSIS — F430 Acute stress reaction: Secondary | ICD-10-CM | POA: Insufficient documentation

## 2013-09-23 DIAGNOSIS — F32A Depression, unspecified: Secondary | ICD-10-CM

## 2013-09-23 DIAGNOSIS — F17200 Nicotine dependence, unspecified, uncomplicated: Secondary | ICD-10-CM | POA: Insufficient documentation

## 2013-09-23 DIAGNOSIS — F3289 Other specified depressive episodes: Secondary | ICD-10-CM

## 2013-09-23 DIAGNOSIS — Z8701 Personal history of pneumonia (recurrent): Secondary | ICD-10-CM | POA: Insufficient documentation

## 2013-09-23 DIAGNOSIS — F1994 Other psychoactive substance use, unspecified with psychoactive substance-induced mood disorder: Secondary | ICD-10-CM

## 2013-09-23 DIAGNOSIS — I2510 Atherosclerotic heart disease of native coronary artery without angina pectoris: Secondary | ICD-10-CM | POA: Insufficient documentation

## 2013-09-23 DIAGNOSIS — F919 Conduct disorder, unspecified: Secondary | ICD-10-CM | POA: Insufficient documentation

## 2013-09-23 DIAGNOSIS — F1010 Alcohol abuse, uncomplicated: Secondary | ICD-10-CM

## 2013-09-23 DIAGNOSIS — F329 Major depressive disorder, single episode, unspecified: Secondary | ICD-10-CM

## 2013-09-23 DIAGNOSIS — R9431 Abnormal electrocardiogram [ECG] [EKG]: Secondary | ICD-10-CM

## 2013-09-23 LAB — CBC WITH DIFFERENTIAL/PLATELET
BASOS ABS: 0 10*3/uL (ref 0.0–0.1)
BASOS PCT: 0 % (ref 0–1)
EOS ABS: 0.2 10*3/uL (ref 0.0–0.7)
EOS PCT: 3 % (ref 0–5)
HCT: 40.9 % (ref 39.0–52.0)
Hemoglobin: 14.8 g/dL (ref 13.0–17.0)
LYMPHS PCT: 43 % (ref 12–46)
Lymphs Abs: 2.5 10*3/uL (ref 0.7–4.0)
MCH: 32 pg (ref 26.0–34.0)
MCHC: 36.2 g/dL — AB (ref 30.0–36.0)
MCV: 88.3 fL (ref 78.0–100.0)
Monocytes Absolute: 0.4 10*3/uL (ref 0.1–1.0)
Monocytes Relative: 7 % (ref 3–12)
Neutro Abs: 2.7 10*3/uL (ref 1.7–7.7)
Neutrophils Relative %: 47 % (ref 43–77)
PLATELETS: 192 10*3/uL (ref 150–400)
RBC: 4.63 MIL/uL (ref 4.22–5.81)
RDW: 13.3 % (ref 11.5–15.5)
WBC: 5.7 10*3/uL (ref 4.0–10.5)

## 2013-09-23 LAB — RAPID URINE DRUG SCREEN, HOSP PERFORMED
AMPHETAMINES: NOT DETECTED
BARBITURATES: NOT DETECTED
BENZODIAZEPINES: NOT DETECTED
COCAINE: NOT DETECTED
Opiates: NOT DETECTED
Tetrahydrocannabinol: NOT DETECTED

## 2013-09-23 LAB — COMPREHENSIVE METABOLIC PANEL
ALBUMIN: 4.3 g/dL (ref 3.5–5.2)
ALT: 19 U/L (ref 0–53)
AST: 31 U/L (ref 0–37)
Alkaline Phosphatase: 74 U/L (ref 39–117)
BUN: 12 mg/dL (ref 6–23)
CALCIUM: 9.2 mg/dL (ref 8.4–10.5)
CO2: 23 mEq/L (ref 19–32)
Chloride: 102 mEq/L (ref 96–112)
Creatinine, Ser: 1.04 mg/dL (ref 0.50–1.35)
GFR calc Af Amer: >90 mL/min (ref >90)
GFR calc non Af Amer: 83 mL/min — ABNORMAL LOW (ref >90)
Glucose, Bld: 83 mg/dL (ref 70–99)
Potassium: 3.8 mEq/L (ref 3.7–5.3)
SODIUM: 141 meq/L (ref 137–147)
TOTAL PROTEIN: 8.2 g/dL (ref 6.0–8.3)
Total Bilirubin: 0.4 mg/dL (ref 0.3–1.2)

## 2013-09-23 LAB — I-STAT TROPONIN, ED: Troponin i, poc: 0 ng/mL (ref 0.00–0.08)

## 2013-09-23 LAB — ACETAMINOPHEN LEVEL: Acetaminophen (Tylenol), Serum: <15 ug/mL (ref 10–30)

## 2013-09-23 LAB — ETHANOL: ALCOHOL ETHYL (B): 207 mg/dL — AB (ref 0–11)

## 2013-09-23 MED ORDER — NICOTINE 21 MG/24HR TD PT24
21.0000 mg | MEDICATED_PATCH | Freq: Every day | TRANSDERMAL | Status: DC
  Administered 2013-09-23: 21 mg via TRANSDERMAL
  Filled 2013-09-23: qty 1

## 2013-09-23 MED ORDER — ZOLPIDEM TARTRATE 5 MG PO TABS: 5.0000 mg | ORAL_TABLET | Freq: Every evening | ORAL | Status: DC | PRN

## 2013-09-23 MED ORDER — ALUM & MAG HYDROXIDE-SIMETH 200-200-20 MG/5ML PO SUSP: 30.0000 mL | ORAL | Status: DC | PRN

## 2013-09-23 MED ORDER — ONDANSETRON HCL 4 MG PO TABS: 4.0000 mg | ORAL_TABLET | Freq: Three times a day (TID) | ORAL | Status: DC | PRN

## 2013-09-23 MED ORDER — LORAZEPAM 1 MG PO TABS: 1.0000 mg | ORAL_TABLET | Freq: Three times a day (TID) | ORAL | Status: DC | PRN

## 2013-09-23 NOTE — ED Provider Notes (Signed)
Medical screening examination/treatment/procedure(s) were performed by non-physician practitioner and as supervising physician I was immediately available for consultation/collaboration.   EKG Interpretation   Date/Time:  Sunday September 23 2013 01:36:12 EDT Ventricular Rate:  63 PR Interval:  176 QRS Duration: 93 QT Interval:  413 QTC Calculation: 423 R Axis:   32 Text Interpretation:  Sinus rhythm Anterolateral infarct, acute (LAD) ST  elevation v3-v6, new Confirmed by Kathrynn Humble, MD, Rokhaya Quinn ([id number]) on 09/23/2013  1:50:35 AM       Varney Biles, MD 09/23/13 0600

## 2013-09-23 NOTE — ED Notes (Signed)
Bed: [id number] Expected date:  Expected time:  Means of arrival:  Comments: TCU

## 2013-09-23 NOTE — BH Assessment (Signed)
TTS consult request was entered at [facility] but [facility] staff did not contact TTS until 0509. Spoke with Antonietta Breach, PA-C who said Pt has a history of bipolar disorder and is off medications. He reports worsening depression, suicidal ideation, alcohol abuse and marijuana use. Tele-assessment will be initiated.  Orpah Greek Rosana Hoes, [facility] Triage Specialist [phone]

## 2013-09-23 NOTE — Discharge Instructions (Signed)
Depression, Adult °Depression is feeling sad, low, down in the dumps, blue, gloomy, or empty. In general, there are two kinds of depression: °· Normal sadness or grief. This can happen after something upsetting. It often goes away on its own within 2 weeks. After losing a loved one (bereavement), normal sadness and grief may last longer than two weeks. It usually gets better with time. °· Clinical depression. This kind lasts longer than normal sadness or grief. It keeps you from doing the things you normally do in life. It is often hard to function at home, work, or at school. It may affect your relationships with others. Treatment is often needed. °GET HELP RIGHT AWAY IF: °· You have thoughts about hurting yourself or others. °· You lose touch with reality (psychotic symptoms). You may: °· See or hear things that are not real. °· Have untrue beliefs about your life or people around you. °· Your medicine is giving you problems. °MAKE SURE YOU: °· Understand these instructions. °· Will watch your condition. °· Will get help right away if you are not doing well or get worse. °Document Released: 06/26/2010 Document Revised: 02/16/2012 Document Reviewed: 09/23/2011 °Exit[employer]® Patient Information ©2014 [employer]. ° °

## 2013-09-23 NOTE — ED Provider Notes (Signed)
CSN: [id number]     Arrival date & time 09/23/13  0030 History   First MD Initiated Contact with Patient 09/23/13 0035     Chief Complaint  Patient presents with  . Medical Clearance     (Consider location/radiation/quality/duration/timing/severity/associated sxs/prior Treatment) HPI Comments: Patient is a 48 year old male who presents to the emergency department for worsening depression and suicidal ideations. Patient states that he has been off of his bipolar medication for the past year because he thought he was able to manage his symptoms on his own. Patient has not seen a psychiatrist or therapist over this time. He states that recently his depression has been worsening and that he has had multiple stressors in his life worsening his bipolar disorder. Patient endorses suicidal ideations, but denies explicit plan. Most thoughts of suicide include opportunistic plans (ie - looking at bridges and feeling like he wants to jump). He endorses ETOH use today of 3-4 40oz beers as well as marijuana use. He denies HI or other illicit drug use. Hx of suicide attempt in the 37's.  The history is provided by the patient. No language interpreter was used.    Past Medical History  Diagnosis Date  . Chest pain, mid sternal   . CAD in native artery     a. Nonobstructive cath 11/2007;  b. Presented with ST elevation - Nonobstructive cath 08/2011  . GERD (gastroesophageal reflux disease)   . Tobacco abuse   . Marijuana abuse     a. uses ~ 1x /wk or less  . History of cocaine abuse     a. quit ? 2009  . History of ETOH abuse     a. drinks 2 "40's" / wk  . Bradycardia     a. asymptomatic  . Bipolar disorder   . Syncope     a. 12/2010 - presumed to be vasovagal  . Abnormal ECG     a. early repolarization  . Coronary artery disease   . Pneumonia    Past Surgical History  Procedure Laterality Date  . Coronary angioplasty with stent placement     No family history on file. History  Substance Use  Topics  . Smoking status: Current Every Day Smoker -- 0.50 packs/day for 20 years    Types: Cigarettes  . Smokeless tobacco: Not on file     Comment: Smokes about 3 cigarettes/day  . Alcohol Use: Yes     Comment: 4-5 40oz/day    Review of Systems  Psychiatric/Behavioral: Positive for suicidal ideas and behavioral problems.  All other systems reviewed and are negative.     Allergies  Aspirin; Pepperoni; Tomato; and Tylenol  Home Medications   Prior to Admission medications   Medication Sig Start Date End Date Taking? Authorizing Provider  escitalopram (LEXAPRO) 10 MG tablet Take 1 tablet (10 mg total) by mouth daily. 10/17/12   Taylan-Adam Bonk, MD  QUEtiapine (SEROQUEL) 100 MG tablet Take 1 tablet (100 mg total) by mouth at bedtime. 10/17/12   Oz-Adam Bonk, MD   BP 118/75  Pulse 69  Temp(Src) 98 F (36.7 C) (Oral)  Resp 16  Ht 5\' 4"  (1.626 m)  Wt 130 lb (58.968 kg)  BMI 22.30 kg/m2  SpO2 96%  Physical Exam  Nursing note and vitals reviewed. Constitutional: He is oriented to person, place, and time. He appears well-developed and well-nourished. No distress.  HENT:  Head: Normocephalic and atraumatic.  Mouth/Throat: Oropharynx is clear and moist. No oropharyngeal exudate.  Eyes: Conjunctivae and EOM  are normal. No scleral icterus.  Neck: Normal range of motion.  Cardiovascular: Normal rate, regular rhythm and normal heart sounds.   Pulmonary/Chest: Effort normal and breath sounds normal. No respiratory distress. He has no wheezes. He has no rales.  Musculoskeletal: Normal range of motion.  Neurological: He is alert and oriented to person, place, and time.  GCS 15. Speech is goal oriented. Patient moves extremities without ataxia.  Skin: Skin is warm and dry. No rash noted. He is not diaphoretic. No erythema. No pallor.  Psychiatric: His speech is normal. Cognition and memory are normal. He exhibits a depressed mood. He expresses suicidal ideation. He expresses no  homicidal ideation. He expresses no suicidal plans and no homicidal plans.  Patient tearful in exam room    ED Course  Procedures (including critical care time) Labs Review Labs Reviewed  CBC WITH DIFFERENTIAL - Abnormal; Notable for the following:    MCHC 36.2 (*)    All other components within normal limits  COMPREHENSIVE METABOLIC PANEL - Abnormal; Notable for the following:    GFR calc non Af Amer 83 (*)    All other components within normal limits  ETHANOL - Abnormal; Notable for the following:    Alcohol, Ethyl (B) 207 (*)    All other components within normal limits  URINE RAPID DRUG SCREEN (HOSP PERFORMED)  ACETAMINOPHEN LEVEL  I-STAT TROPOININ, ED    Imaging Review No results found.   EKG Interpretation   Date/Time:  Sunday September 23 2013 01:36:12 EDT Ventricular Rate:  63 PR Interval:  176 QRS Duration: 93 QT Interval:  413 QTC Calculation: 423 R Axis:   32 Text Interpretation:  Sinus rhythm Anterolateral infarct, acute (LAD) ST  elevation v3-v6, new Confirmed by Kathrynn Humble, MD, ANKIT [phone]) on 09/23/2013  1:50:35 AM      MDM   Final diagnoses:  Suicidal ideations  Depression    0500 - Patient presents to the emergency department for worsening depression and suicidal thoughts. Patient has been off of his psychiatric medication for over one year. Medical screening workup significant for ethanol level of 207. Patient also found to have no ST elevations in V3-V6 on EKG. Troponin 0.00 and patient having no active chest pain. Have consulted with Lurena Joiner from [facility] regarding patient case and hx of 40-50% LAD disease. He explains he believes his EKG findings to be related to repolarization changes. He recommends patient be warned on the risks of cocaine use and to f/u with his primary care provider to have risk stratification for heart disease and lipid panels performed. He does not see the need for cardiology f/u or intervention at this time. Patient  medically cleared and pending TTS evaluation.  Patient disposition to be determined by oncoming ED provider  Filed Vitals:   09/23/13 0110 09/23/13 0547  BP: 118/75 101/61  Pulse: 69 74  Temp: 98 F (36.7 C) 98.1 F (36.7 C)  TempSrc: Oral Oral  Resp: 16 16  Height: 5\' 4"  (1.626 m)   Weight: 130 lb (58.968 kg)   SpO2: 96% 95%     Antonietta Breach, PA-C 09/23/13 0630

## 2013-09-23 NOTE — [facility] Suicide Risk Assessment (Signed)
Demographic Factors:  Male, Low socioeconomic status, Living alone and Unemployed  Total Time spent with patient: 1 hour  Psychiatric Specialty Exam: Physical Exam  Constitutional: He is oriented to person, place, and time. He appears well-developed and well-nourished.  HENT:  Head: Normocephalic and atraumatic.  Neck: Normal range of motion.  Respiratory: Effort normal.  Musculoskeletal: Normal range of motion.  Neurological: He is alert and oriented to person, place, and time.  Skin: Skin is warm and dry.    Review of Systems  Constitutional: Negative.   HENT: Negative.   Eyes: Negative.   Respiratory: Negative.   Cardiovascular: Positive for chest pain.  Gastrointestinal: Negative.   Genitourinary: Negative.   Musculoskeletal: Negative.   Skin: Negative.   Neurological: Negative.   Endo/Heme/Allergies: Negative.   Psychiatric/Behavioral: Negative for depression, suicidal ideas and substance abuse.    Blood pressure 100/63, pulse 50, temperature 98.8 F (37.1 C), temperature source Oral, resp. rate 14, height 5\' 4"  (1.626 m), weight 130 lb (58.968 kg), SpO2 98.00%.Body mass index is 22.3 kg/(m^2).  General Appearance: Casual  Eye Contact::  Good  Speech:  Clear and Coherent  Volume:  Normal  Mood:  Dysphoric  Affect:  Appropriate  Thought Process:  Goal Directed  Orientation:  Full (Time, Place, and Person)  Thought Content:  WDL  Suicidal Thoughts:  No  Homicidal Thoughts:  No  Memory:  Immediate;   Good Recent;   Fair Remote;   Fair  Judgement:  Fair  Insight:  Fair  Psychomotor Activity:  Normal  Concentration:  Good  Recall:  [place]ard of Knowledge:Good  Language: Good  Akathisia:  No  Handed:  Right  AIMS (if indicated):     Assets:  Communication Skills Desire for Improvement Social Support  Sleep:       Musculoskeletal: Strength & Muscle Tone: within normal limits Gait & Station: normal Patient leans: N/A   Mental Status Per Nursing  Assessment::   On Admission:     Current Mental Status by Physician: patient denies suicidal plan or intent  Loss Factors: Decrease in vocational status and Financial problems/change in socioeconomic status  Historical Factors: Impulsivity  Risk Reduction Factors:   Responsible for children under 71 years of age and Positive social support  Continued Clinical Symptoms:  Depression:   Comorbid alcohol abuse/dependence Alcohol/Substance Abuse/Dependencies  Cognitive Features That Contribute To Risk:  Thought constriction (tunnel vision)    Suicide Risk:  Minimal: No identifiable suicidal ideation.  Patients presenting with no risk factors but with morbid ruminations; may be classified as minimal risk based on the severity of the depressive symptoms  Discharge Diagnoses:   AXIS I:  Alcohol Abuse and Depressive Disorder NOS AXIS II:  Deferred AXIS III:   Past Medical History  Diagnosis Date  . Chest pain, mid sternal   . CAD in native artery     a. Nonobstructive cath 11/2007;  b. Presented with ST elevation - Nonobstructive cath 08/2011  . GERD (gastroesophageal reflux disease)   . Tobacco abuse   . Marijuana abuse     a. uses ~ 1x /wk or less  . History of cocaine abuse     a. quit ? 2009  . History of ETOH abuse     a. drinks 2 "40's" / wk  . Bradycardia     a. asymptomatic  . Bipolar disorder   . Syncope     a. 12/2010 - presumed to be vasovagal  . Abnormal ECG  a. early repolarization  . Coronary artery disease   . Pneumonia    AXIS IV:  economic problems, other psychosocial or environmental problems, problems related to legal system/crime and problems related to social environment AXIS V:  61-70 mild symptoms  Plan Of Care/Follow-up recommendations:  Activity:  normal Diet:  heart healthy Other:  [facility]  Is patient on multiple antipsychotic therapies at discharge:  No   Has Patient had three or more failed trials  of antipsychotic monotherapy by history:  No  Recommended Plan for Multiple Antipsychotic Therapies:n/a     Levonne Spiller 09/23/2013, 1:06 PM

## 2013-09-23 NOTE — ED Notes (Signed)
Pt brought to ED by [employer], pt is voluntary. Pt states he feels like he wants to "give up". Pt endorses SI, states he felt like he wanted to "go jump" pt states he has been off meds x 1 year.

## 2013-09-23 NOTE — BH Assessment (Signed)
Assessment Note  [name] [name] is an 48 y.o. male.   What Led to ER Visit:  Pt came to ED due to feelings of suicide and plan to walk into traffic or jump off bridge.  Pt has past hx of attempts (2) where he walked into traffic and one where he OD.  Both resulted in placement per pt. Pt admits he uses alcohol periodically and THC.  Pt reports using alcohol 1 to 2 beers per day, sometimes, and THC when available.    Pt reports stressors involving his children, he has 7, and kept repeating the number, and they are not involved with him.  Pt also reports living in a motel the last 2 years and it costs 650 per month "which is no problem, money isn't the thing, it's just life, man, too many obstacles."    Pt reports he is on probation for simple assault that occurred 18 months ago.  Pt reports he is cooperative with all classes and requirements he is mandated to participate in per courts.    "I want to live. I do.  I just am tired of being so at the end of my rope.  I have been off my medications for 1 year and I know, when I get like this, I know I am going to do something.  I need help."  Pt reports he used to take Seroquel and neurontin but can't remember the other medications.  Pt reports past hx of [facility].  Pt eye contact was fair, speech soft but strong, Ox3, polite and interacted well and disheveled in appearance  Recommendation  Pt will be rounded on by psychiatry and determine dispo.  Pt appears to need inptx  Axis I: Generalized Anxiety Disorder, Major Depression, Recurrent severe and Substance Abuse Axis II: Deferred Axis III:  Past Medical History  Diagnosis Date  . Chest pain, mid sternal   . CAD in native artery     a. Nonobstructive cath 11/2007;  b. Presented with ST elevation - Nonobstructive cath 08/2011  . GERD (gastroesophageal reflux disease)   . Tobacco abuse   . Marijuana abuse     a. uses ~ 1x /wk or less  . History of cocaine abuse     a. quit ? 2009  . History  of ETOH abuse     a. drinks 2 "40's" / wk  . Bradycardia     a. asymptomatic  . Bipolar disorder   . Syncope     a. 12/2010 - presumed to be vasovagal  . Abnormal ECG     a. early repolarization  . Coronary artery disease   . Pneumonia    Axis IV: other psychosocial or environmental problems, problems related to legal system/crime, problems related to social environment and problems with primary support group Axis V: 41-50 serious symptoms  Past Medical History:  Past Medical History  Diagnosis Date  . Chest pain, mid sternal   . CAD in native artery     a. Nonobstructive cath 11/2007;  b. Presented with ST elevation - Nonobstructive cath 08/2011  . GERD (gastroesophageal reflux disease)   . Tobacco abuse   . Marijuana abuse     a. uses ~ 1x /wk or less  . History of cocaine abuse     a. quit ? 2009  . History of ETOH abuse     a. drinks 2 "40's" / wk  . Bradycardia     a. asymptomatic  . Bipolar disorder   .  Syncope     a. 12/2010 - presumed to be vasovagal  . Abnormal ECG     a. early repolarization  . Coronary artery disease   . Pneumonia     Past Surgical History  Procedure Laterality Date  . Coronary angioplasty with stent placement      Family History: No family history on file.  Social History:  reports that he has been smoking Cigarettes.  He has a 10 pack-year smoking history. He does not have any smokeless tobacco history on file. He reports that he drinks alcohol. He reports that he uses illicit drugs (Marijuana).  Additional Social History:  Alcohol / Drug Use Pain Medications: na Prescriptions: na Over the Counter: na History of alcohol / drug use?: Yes Negative Consequences of Use: Financial;Legal;Personal relationships;Work / Automotive engineer #1 Name of Substance 1: alcohol 1 - Age of First Use: teen 1 - Amount (size/oz): varies - 3 beers daily 1 - Frequency: daily 1 - Duration: years 1 - Last Use / Amount: 09-22-13 Substance #2 Name of  Substance 2: THC 2 - Age of First Use: teen 2 - Amount (size/oz): varies 2 - Frequency: varies 2 - Duration: varies 2 - Last Use / Amount: 09-23-13  CIWA: CIWA-Ar BP: 101/61 mmHg Pulse Rate: 74 Nausea and Vomiting: no nausea and no vomiting Tactile Disturbances: none Tremor: no tremor Auditory Disturbances: not present Paroxysmal Sweats: no sweat visible Visual Disturbances: not present Anxiety: no anxiety, at ease Headache, Fullness in Head: none present Agitation: normal activity Orientation and Clouding of Sensorium: oriented and can do serial additions CIWA-Ar Total: 0 COWS:    Allergies:  Allergies  Allergen Reactions  . Aspirin     Aggravates ulcer. "Causes chest pain."  . Pepperoni [Pickled Meat] Other (See Comments)    aggrevates ulcer  . Tomato Other (See Comments)    Foods with tomato sauce aggrevate ulcers  . Tylenol [Acetaminophen]     Pt reports hx of ulcers    Home Medications:  (Not in a hospital admission)  OB/GYN Status:  No LMP for male patient.  General Assessment Data Location of Assessment: [facility] ED Is this a Tele or Face-to-Face Assessment?: Face-to-Face Is this an Initial Assessment or a Re-assessment for this encounter?: Initial Assessment Living Arrangements: Alone;Other (Comment) Can pt return to current living arrangement?: Yes Admission Status: Voluntary Is patient capable of signing voluntary admission?: Yes Transfer from: Acute Hospital Referral Source: MD  Medical Screening Exam ([facility]) Medical Exam completed: Yes  [facility] Living Arrangements: Alone;Other (Comment) Name of Psychiatrist: na Name of Therapist: na  Education Status Is patient currently in school?: No Current Grade: na Highest grade of school patient has completed: na Name of school: na Contact person: na  Risk to self Suicidal Ideation: Yes-Currently Present Suicidal Intent: Yes-Currently Present Is patient at risk for suicide?:  Yes Suicidal Plan?: Yes-Currently Present Specify Current Suicidal Plan: walk into traffic Access to Means: Yes Specify Access to Suicidal Means: has use of legs and can find traffic What has been your use of drugs/alcohol within the last 12 months?: thc and alcohol Previous Attempts/Gestures: Yes How many times?: 2 Other Self Harm Risks: na Triggers for Past Attempts: Unpredictable Intentional Self Injurious Behavior: None Family Suicide History: No Recent stressful life event(s): Conflict (Comment);Other (Comment) (life stressors) Persecutory voices/beliefs?: No Depression: Yes Depression Symptoms: Tearfulness;Fatigue;Guilt;Loss of interest in usual pleasures;Feeling worthless/self pity;Feeling angry/irritable Substance abuse history and/or treatment for substance abuse?: Yes Suicide prevention information given  to non-admitted patients: Not applicable  Risk to Others Homicidal Ideation: No Thoughts of Harm to Others: No Current Homicidal Intent: No Current Homicidal Plan: No Access to Homicidal Means: No Identified Victim: na History of harm to others?: No Assessment of Violence: None Noted Violent Behavior Description: cooperative Does patient have access to weapons?: No Criminal Charges Pending?: No (onprobation for simple assault occured 18 mths ago) Does patient have a court date: No  Psychosis Hallucinations: None noted Delusions: None noted  Mental Status Report Appear/Hygiene: Disheveled Eye Contact: Fair Motor Activity: Unremarkable Speech: Soft;Logical/coherent Level of Consciousness: Alert Mood: Depressed;Anxious;Ashamed/humiliated;Sad;Worthless, low self-esteem Affect: Anxious;Depressed;Appropriate to circumstance;Sad Anxiety Level: Moderate Thought Processes: Coherent Judgement: Impaired Orientation: Person;Place;Situation Obsessive Compulsive Thoughts/Behaviors: None  Cognitive Functioning Concentration: Decreased Memory: Recent Intact;Remote  Intact IQ: Average Insight: Fair Impulse Control: Poor Appetite: Fair Weight Loss: 0 Weight Gain: 0 Sleep: Decreased Total Hours of Sleep: 5 Vegetative Symptoms: None  ADLScreening [facility] Assessment Services) Patient's cognitive ability adequate to safely complete daily activities?: Yes Patient able to express need for assistance with ADLs?: Yes Independently performs ADLs?: Yes (appropriate for developmental age)  Prior Inpatient Therapy Prior Inpatient Therapy: Yes Prior Therapy Dates: 2014 Prior Therapy Facilty/Provider(s): [facility] Reason for Treatment: SI / rehab  Prior Outpatient Therapy Prior Outpatient Therapy: No Prior Therapy Dates: na Prior Therapy Facilty/Provider(s): na Reason for Treatment: na  ADL Screening (condition at time of admission) Patient's cognitive ability adequate to safely complete daily activities?: Yes Is the patient deaf or have difficulty hearing?: No Does the patient have difficulty seeing, even when wearing glasses/contacts?: No Does the patient have difficulty concentrating, remembering, or making decisions?: No Patient able to express need for assistance with ADLs?: Yes Does the patient have difficulty dressing or bathing?: No Independently performs ADLs?: Yes (appropriate for developmental age) Weakness of Legs: None Weakness of Arms/Hands: None  Home Assistive Devices/Equipment Home Assistive Devices/Equipment: None  Therapy Consults (therapy consults require a physician order) PT Evaluation Needed: No OT Evalulation Needed: No SLP Evaluation Needed: No Abuse/Neglect Assessment (Assessment to be complete while patient is alone) Physical Abuse: Denies Verbal Abuse: Denies Sexual Abuse: Denies Exploitation of patient/patient's resources: Denies Self-Neglect: Denies Values / Beliefs Cultural Requests During Hospitalization: None Spiritual Requests During Hospitalization: None Consults Spiritual Care Consult Needed:  No Social Work Consult Needed: No Regulatory affairs officer (For Healthcare) Advance Directive: Patient does not have advance directive Pre-existing out of facility DNR order (yellow form or pink MOST form): No    Additional Information 1:1 In Past 12 Months?: No CIRT Risk: No Elopement Risk: No Does patient have medical clearance?: Yes     Disposition:  Disposition Initial Assessment Completed for this Encounter: Yes Disposition of Patient: Inpatient treatment program Type of inpatient treatment program: Adult  On Site Evaluation by:   Reviewed with Physician:    Norberto Giovanni. 09/23/2013 10:00 AM

## 2013-09-23 NOTE — ED Notes (Signed)
Bed: [id number] Expected date:  Expected time:  Means of arrival:  Comments: TCU
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
## 2013-09-23 NOTE — ED Notes (Signed)
Pt arrived to unit; no s/s of distress noted. Pt immediately went to sleep on arrival, did not want to talk for an assessment. Will monitor.

## 2013-09-23 NOTE — BH Assessment (Signed)
Contacted Psych ED to arrange tele-assessment. Otho Najjar, RN states Pt has been medicated and is currently unable to participate in assessment. Psych ED staff will contact TTS at [phone] when Pt is able to participate.  Orpah Greek Rosana Hoes, [facility] Triage Specialist [phone]

## 2014-04-21 ENCOUNTER — Encounter (HOSPITAL_COMMUNITY): Payer: Self-pay | Admitting: *Deleted

## 2014-04-21 ENCOUNTER — Emergency Department (HOSPITAL_COMMUNITY)
Admission: EM | Admit: 2014-04-21 | Discharge: 2014-04-23 | Disposition: A | Discharge status: Home / Self Care | Payer: Medicaid Other | Attending: Emergency Medicine | Admitting: Emergency Medicine

## 2014-04-21 DIAGNOSIS — F1914 Other psychoactive substance abuse with psychoactive substance-induced mood disorder: Secondary | ICD-10-CM | POA: Insufficient documentation

## 2014-04-21 DIAGNOSIS — Z8719 Personal history of other diseases of the digestive system: Secondary | ICD-10-CM | POA: Diagnosis not present

## 2014-04-21 DIAGNOSIS — F329 Major depressive disorder, single episode, unspecified: Secondary | ICD-10-CM

## 2014-04-21 DIAGNOSIS — I2510 Atherosclerotic heart disease of native coronary artery without angina pectoris: Secondary | ICD-10-CM | POA: Diagnosis not present

## 2014-04-21 DIAGNOSIS — Z8701 Personal history of pneumonia (recurrent): Secondary | ICD-10-CM | POA: Insufficient documentation

## 2014-04-21 DIAGNOSIS — Z720 Tobacco use: Secondary | ICD-10-CM | POA: Diagnosis not present

## 2014-04-21 DIAGNOSIS — R45851 Suicidal ideations: Secondary | ICD-10-CM

## 2014-04-21 DIAGNOSIS — F32A Depression, unspecified: Secondary | ICD-10-CM

## 2014-04-21 DIAGNOSIS — F1910 Other psychoactive substance abuse, uncomplicated: Secondary | ICD-10-CM

## 2014-04-21 DIAGNOSIS — F1020 Alcohol dependence, uncomplicated: Secondary | ICD-10-CM | POA: Diagnosis not present

## 2014-04-21 DIAGNOSIS — Z9861 Coronary angioplasty status: Secondary | ICD-10-CM | POA: Insufficient documentation

## 2014-04-21 DIAGNOSIS — F1210 Cannabis abuse, uncomplicated: Secondary | ICD-10-CM | POA: Diagnosis not present

## 2014-04-21 DIAGNOSIS — F1994 Other psychoactive substance use, unspecified with psychoactive substance-induced mood disorder: Secondary | ICD-10-CM

## 2014-04-21 LAB — CBC
HEMATOCRIT: 41.7 % (ref 39.0–52.0)
Hemoglobin: 15 g/dL (ref 13.0–17.0)
MCH: 31.8 pg (ref 26.0–34.0)
MCHC: 36 g/dL (ref 30.0–36.0)
MCV: 88.3 fL (ref 78.0–100.0)
Platelets: 230 10*3/uL (ref 150–400)
RBC: 4.72 MIL/uL (ref 4.22–5.81)
RDW: 12.9 % (ref 11.5–15.5)
WBC: 5.7 10*3/uL (ref 4.0–10.5)

## 2014-04-21 MED ORDER — LORAZEPAM 1 MG PO TABS
0.0000 mg | ORAL_TABLET | Freq: Four times a day (QID) | ORAL | Status: DC
  Administered 2014-04-22: 1 mg via ORAL
  Administered 2014-04-23: 2 mg via ORAL
  Filled 2014-04-21: qty 2
  Filled 2014-04-21: qty 1

## 2014-04-21 MED ORDER — VITAMIN B-1 100 MG PO TABS
100.0000 mg | ORAL_TABLET | Freq: Every day | ORAL | Status: DC
  Administered 2014-04-23: 100 mg via ORAL
  Filled 2014-04-21: qty 1

## 2014-04-21 MED ORDER — THIAMINE HCL 100 MG/ML IJ SOLN: 100.0000 mg | Freq: Every day | INTRAMUSCULAR | Status: DC

## 2014-04-21 MED ORDER — ONDANSETRON HCL 4 MG PO TABS: 4.0000 mg | ORAL_TABLET | Freq: Three times a day (TID) | ORAL | Status: DC | PRN

## 2014-04-21 MED ORDER — ALUM & MAG HYDROXIDE-SIMETH 200-200-20 MG/5ML PO SUSP
30.0000 mL | ORAL | Status: DC | PRN
  Administered 2014-04-21: 30 mL via ORAL
  Filled 2014-04-21: qty 30

## 2014-04-21 MED ORDER — LORAZEPAM 1 MG PO TABS: 0.0000 mg | ORAL_TABLET | Freq: Two times a day (BID) | ORAL | Status: DC

## 2014-04-21 MED ORDER — NICOTINE 21 MG/24HR TD PT24: 21.0000 mg | MEDICATED_PATCH | Freq: Every day | TRANSDERMAL | Status: DC

## 2014-04-21 NOTE — ED Notes (Signed)
PT arrives to the ER with [employer] voluntarily for evaluation of depression, ETOH abuse and suicidal thoughts; pt states that he has had depression for years but has not taken medications in 2-3 years; pt states that his wife had a stroke 3 months ago and he has been taking care of her; pt states that he has become more depressed and increased his ETOH intake in dealing with his wife; pt states that he drinks 4-5 40oz / day; pt states that he "cannot take it anymore and just wants to end it" pt denies having a specific plan

## 2014-04-21 NOTE — BH Assessment (Signed)
Received call for assessment. Spoke with Dr. Linton Flemings who said Pt has a history of bipolar disorder and is severely depressed with suicidal ideation. He is off medications and abusing alcohol. Tele-assessment will be initiated.  Orpah Greek Rosana Hoes, [facility] Triage Specialist [phone]

## 2014-04-21 NOTE — ED Notes (Signed)
Pt awake. Verbally responsive. Resp even and unlabored. Pt reported feelings of depression and wanting to die. Pt reported his plan to SI is jump off a bridge. Reported he has not taken any medication for 2 years or f/u with MD.

## 2014-04-21 NOTE — ED Notes (Signed)
MD at bedside. 
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
## 2014-04-22 ENCOUNTER — Encounter (HOSPITAL_COMMUNITY): Payer: Self-pay | Admitting: Psychiatry

## 2014-04-22 DIAGNOSIS — F329 Major depressive disorder, single episode, unspecified: Secondary | ICD-10-CM | POA: Diagnosis present

## 2014-04-22 DIAGNOSIS — R45851 Suicidal ideations: Secondary | ICD-10-CM

## 2014-04-22 LAB — COMPREHENSIVE METABOLIC PANEL
ALK PHOS: 76 U/L (ref 39–117)
ALT: 16 U/L (ref 0–53)
AST: 26 U/L (ref 0–37)
Albumin: 4 g/dL (ref 3.5–5.2)
Anion gap: 13 (ref 5–15)
BUN: 9 mg/dL (ref 6–23)
CO2: 25 meq/L (ref 19–32)
CREATININE: 1.09 mg/dL (ref 0.50–1.35)
Calcium: 9.2 mg/dL (ref 8.4–10.5)
Chloride: 101 mEq/L (ref 96–112)
GFR calc Af Amer: >90 mL/min (ref >90)
GFR, EST NON AFRICAN AMERICAN: 78 mL/min — AB (ref >90)
Glucose, Bld: 100 mg/dL — ABNORMAL HIGH (ref 70–99)
POTASSIUM: 3.6 meq/L — AB (ref 3.7–5.3)
Sodium: 139 mEq/L (ref 137–147)
Total Bilirubin: 0.5 mg/dL (ref 0.3–1.2)
Total Protein: 8.1 g/dL (ref 6.0–8.3)

## 2014-04-22 LAB — ETHANOL: ALCOHOL ETHYL (B): 141 mg/dL — AB (ref 0–11)

## 2014-04-22 LAB — SALICYLATE LEVEL: Salicylate Lvl: <2 mg/dL — ABNORMAL LOW (ref 2.8–20.0)

## 2014-04-22 LAB — RAPID URINE DRUG SCREEN, HOSP PERFORMED
AMPHETAMINES: NOT DETECTED
BARBITURATES: NOT DETECTED
Benzodiazepines: NOT DETECTED
COCAINE: NOT DETECTED
OPIATES: NOT DETECTED
TETRAHYDROCANNABINOL: POSITIVE — AB

## 2014-04-22 LAB — ACETAMINOPHEN LEVEL: Acetaminophen (Tylenol), Serum: <15 ug/mL (ref 10–30)

## 2014-04-22 MED ORDER — DIPHENHYDRAMINE HCL 25 MG PO CAPS
50.0000 mg | ORAL_CAPSULE | Freq: Once | ORAL | Status: AC
  Administered 2014-04-22: 50 mg via ORAL
  Filled 2014-04-22: qty 2
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
## 2014-04-22 NOTE — ED Notes (Signed)
Pt personal belongings include: black and red bag, thick and thin jacket/coat, and clothing in belongings bag. Pt has wallet at bedside. Belongings kept at [place].

## 2014-04-22 NOTE — ED Notes (Signed)
Pt resting at present, AAO x 3, no distress noted.  Will continue to monitor for safety.

## 2014-04-22 NOTE — ED Notes (Signed)
Resting quietly with eyes closed. Easily arousable. Verbally responsive. Resp even and unlabored. ABC's intact. SR on monitor. NAD noted.

## 2014-04-22 NOTE — ED Notes (Signed)
[facility] with [facility] called and said pt needs inpatient psy treatment but d/t BH is full at this time, plans to find placement.

## 2014-04-22 NOTE — ED Notes (Signed)
Resting quietly with eyes closed. Easily arousable. Verbally responsive. Resp even and unlabored. ABC's . No behavior problems noted. NAD.

## 2014-04-22 NOTE — ED Notes (Signed)
Bed: OI51 Expected date:  Expected time:  Means of arrival:  Comments: Ems- paraplegic, N/v

## 2014-04-22 NOTE — ED Notes (Signed)
Pt talking to telepsyche at this time.

## 2014-04-22 NOTE — ED Notes (Signed)
1 pt belonging bag, 1 red bag, 1 coat in locker #28

## 2014-04-22 NOTE — ED Notes (Signed)
Resting quietly with eyes closed. Easily arousable. Verbally responsive. Resp even and unlabored. ABC's intact. SR on monitor. No behavior problems. NAD noted.

## 2014-04-22 NOTE — ED Notes (Signed)
Patient transported to CT 

## 2014-04-22 NOTE — BH Assessment (Signed)
Assessment complete. [name] [name], [facility], confirms adult unit is currently at capacity. Gave clinical report to Glenda Chroman, NP who agrees Pt meets criteria for inpatient psychiatric treatment. TTS will contact other facilities for placement. Notified Dr. Linton Flemings and Jonne Ply, RN of recommendation.  Orpah Greek Rosana Hoes, [facility] Triage Specialist [phone]

## 2014-04-22 NOTE — ED Notes (Signed)
Bed: PP89 Expected date:  Expected time:  Means of arrival:  Comments: Leh j, room 25

## 2014-04-22 NOTE — ED Notes (Signed)
Resting quietly with eyes closed. Easily arousable. Verbally responsive. Resp even and unlabored. ABC's intact. SR on monitor. Pleasant and cooperative behavior. NAD noted.
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
## 2014-04-23 ENCOUNTER — Encounter (HOSPITAL_COMMUNITY): Payer: Self-pay | Admitting: Registered Nurse

## 2014-04-23 DIAGNOSIS — F332 Major depressive disorder, recurrent severe without psychotic features: Secondary | ICD-10-CM

## 2014-04-23 DIAGNOSIS — R45851 Suicidal ideations: Secondary | ICD-10-CM | POA: Insufficient documentation

## 2014-04-23 MED ORDER — QUETIAPINE FUMARATE 200 MG PO TABS: 200.0000 mg | ORAL_TABLET | Freq: Every day | ORAL | Status: DC

## 2014-04-23 NOTE — ED Notes (Signed)
Pt d/c with a bus pass to follow up with family services. All items returned. Prescription given. Pt denies si and hi.

## 2014-04-23 NOTE — BH Assessment (Signed)
[facility] Assessment Progress Note   Pt seen by psychiatry this AM and is to be discharged with outpatient referrals. Pt was given a list of referrals to follow up with upon discharge.  Shaune Pascal, MS, [facility] [place] Licensed Professional Counselor Therapeutic Triage Specialist [facility] Phone: [phone] Fax: [phone]

## 2014-04-23 NOTE — [facility] Suicide Risk Assessment (Cosign Needed)
Suicide Risk Assessment  Discharge Assessment     Demographic Factors:  Male  Total Time spent with patient: 30 minutes  Psychiatric Specialty Exam:     Blood pressure 105/61, pulse 56, temperature 99.1 F (37.3 C), temperature source Oral, resp. rate 16, height 5\' 5"  (1.651 m), weight 59.875 kg (132 lb), SpO2 100 %.Body mass index is 21.97 kg/(m^2).  General Appearance: Casual  Eye Contact:: Good  Speech: Clear and Coherent and Normal Rate  Volume: Normal  Mood: Depressed  Affect: Congruent  Thought Process: Coherent, Goal Directed and Linear  Orientation: Full (Time, Place, and Person)  Thought Content: no evidence of delusuions, no hallucinations   Suicidal Thoughts: No  Homicidal Thoughts: No  Memory: Immediate; Good Recent; Good Remote; Good  Judgement: Impaired  Insight: Fair  Psychomotor Activity: Decreased  Concentration: Fair  Recall: Good  Fund of Knowledge:Good  Language: Good  Akathisia: No  Handed: Right  AIMS (if indicated):    Assets: Communication Skills Desire for Improvement Housing Social Support  Sleep:     Musculoskeletal: Strength & Muscle Tone: within normal limits Gait & Station: normal Patient leans: N/A  Mental Status Per Nursing Assessment::   On Admission:     Current Mental Status by Physician: Denies suicidal/homicidal ideation, psychosis, and paranoia  Loss Factors: NA  Historical Factors: NA  Risk Reduction Factors:   Seeking treatment  Continued Clinical Symptoms:  Previous Psychiatric Diagnoses and Treatments  Cognitive Features That Contribute To Risk:  Thought constriction (tunnel vision)    Suicide Risk:  Minimal: No identifiable suicidal ideation.  Patients presenting with no risk factors but with morbid ruminations; may be classified as minimal risk based on the severity of the depressive symptoms  Discharge Diagnoses: AXIS I: Major Depression,  Recurrent severe AXIS II: Deferred AXIS III:  Past Medical History  Diagnosis Date  . Chest pain, mid sternal   . CAD in native artery     a. Nonobstructive cath 11/2007; b. Presented with ST elevation - Nonobstructive cath 08/2011  . GERD (gastroesophageal reflux disease)   . Tobacco abuse   . Marijuana abuse     a. uses ~ 1x /wk or less  . History of cocaine abuse     a. quit ? 2009  . History of ETOH abuse     a. drinks 2 "40's" / wk  . Bradycardia     a. asymptomatic  . Bipolar disorder   . Syncope     a. 12/2010 - presumed to be vasovagal  . Abnormal ECG     a. early repolarization  . Coronary artery disease   . Pneumonia    AXIS IV: housing problems, other psychosocial or environmental problems and problems with primary support group AXIS V: 61-70 mild symptoms  Plan Of Care/Follow-up recommendations:  Activity:  as tolerated Diet:  as tolerated Other:  Follow up with Family Services  Is patient on multiple antipsychotic therapies at discharge:  No   Has Patient had three or more failed trials of antipsychotic monotherapy by history:  No  Recommended Plan for Multiple Antipsychotic Therapies: NA    Rankin, Shuvon, FNP-BC 04/23/2014, 12:49 PM

## 2014-04-23 NOTE — ED Notes (Signed)
Pt is calm and cooperative this morning waiting on his breakfast. Pt has a low CIWA at this time. Pt reports that he wants a medication change because his Seroquel makes him drowsy during the day and have night mares. Pt says that he has had increased stress with his girl friend having a stroke three months ago and he has been helping her with little support from her other family members

## 2014-04-23 NOTE — Discharge Instructions (Signed)
Finding Treatment for Alcohol and Drug Addiction It can be hard to find the right place to get professional treatment. Here are some important things to consider:  There are different types of treatment to choose from.  Some programs are live-in (residential) while others are not (outpatient). Sometimes a combination is offered.  No single type of program is right for everyone.  Most treatment programs involve a combination of education, counseling, and a 12-step, spiritually-based approach.  There are non-spiritually based programs (not 12-step).  Some treatment programs are government sponsored. They are geared for patients without private insurance.  Treatment programs can vary in many respects such as:  Cost and types of insurance accepted.  Types of on-site medical services offered.  Length of stay, setting, and size.  Overall philosophy of treatment. A person may need specialized treatment or have needs not addressed by all programs. For example, adolescents need treatment appropriate for their age. Other people have secondary disorders that must be managed as well. Secondary conditions can include mental illness, such as depression or diabetes. Often, a period of detoxification from alcohol or drugs is needed. This requires medical supervision and not all programs offer this. THINGS TO CONSIDER WHEN SELECTING A TREATMENT PROGRAM   Is the program certified by the appropriate government agency? Even private programs must be certified and employ certified professionals.  Does the program accept your insurance? If not, can a payment plan be set up?  Is the facility clean, organized, and well run? Do they allow you to speak with graduates who can share their treatment experience with you? Can you tour the facility? Can you meet with staff?  Does the program meet the full range of individual needs?  Does the treatment program address sexual orientation and physical disabilities?  Do they provide age, gender, and culturally appropriate treatment services?  Is treatment available in languages other than English?  Is long-term aftercare support or guidance encouraged and provided?  Is assessment of an individual's treatment plan ongoing to ensure it meets changing needs?  Does the program use strategies to encourage reluctant patients to remain in treatment long enough to increase the likelihood of success?  Does the program offer counseling (individual or group) and other behavioral therapies?  Does the program offer medicine as part of the treatment regimen, if needed?  Is there ongoing monitoring of possible relapse? Is there a defined relapse prevention program? Are services or referrals offered to family members to ensure they understand addiction and the recovery process? This would help them support the recovering individual.  Are 12-step meetings held at the center or is transport available for patients to attend outside meetings? In countries outside of the [place] and [place], [employer] for contact information for services in your area. Document Released: 04/22/2005 Document Revised: 08/16/2011 Document Reviewed: 11/02/2007 [facility] Patient Information 2015 [place], [place]. This information is not intended to replace advice given to you by your health care provider. Make sure you discuss any questions you have with your health care provider.  Depression Depression refers to feeling sad, low, down in the dumps, blue, gloomy, or empty. In general, there are two kinds of depression:  Normal sadness or normal grief. This kind of depression is one that we all feel from time to time after upsetting life experiences, such as the loss of a job or the ending of a relationship. This kind of depression is considered normal, is short lived, and resolves within a few days to 2 weeks.  Depression experienced after the loss of a loved one (bereavement) often lasts  longer than 2 weeks but normally gets better with time.  Clinical depression. This kind of depression lasts longer than normal sadness or normal grief or interferes with your ability to function at home, at work, and in school. It also interferes with your personal relationships. It affects almost every aspect of your life. Clinical depression is an illness. Symptoms of depression can also be caused by conditions other than those mentioned above, such as:  Physical illness. Some physical illnesses, including underactive thyroid gland (hypothyroidism), severe anemia, specific types of cancer, diabetes, uncontrolled seizures, heart and lung problems, strokes, and chronic pain are commonly associated with symptoms of depression.  Side effects of some prescription medicine. In some people, certain types of medicine can cause symptoms of depression.  Substance abuse. Abuse of alcohol and illicit drugs can cause symptoms of depression. SYMPTOMS Symptoms of normal sadness and normal grief include the following:  Feeling sad or crying for short periods of time.  Not caring about anything (apathy).  Difficulty sleeping or sleeping too much.  No longer able to enjoy the things you used to enjoy.  Desire to be by oneself all the time (social isolation).  Lack of energy or motivation.  Difficulty concentrating or remembering.  Change in appetite or weight.  Restlessness or agitation. Symptoms of clinical depression include the same symptoms of normal sadness or normal grief and also the following symptoms:  Feeling sad or crying all the time.  Feelings of guilt or worthlessness.  Feelings of hopelessness or helplessness.  Thoughts of suicide or the desire to harm yourself (suicidal ideation).  Loss of touch with reality (psychotic symptoms). Seeing or hearing things that are not real (hallucinations) or having false beliefs about your life or the people around you (delusions and  paranoia). DIAGNOSIS  The diagnosis of clinical depression is usually based on how bad the symptoms are and how long they have lasted. Your health care provider will also ask you questions about your medical history and substance use to find out if physical illness, use of prescription medicine, or substance abuse is causing your depression. Your health care provider may also order blood tests. TREATMENT  Often, normal sadness and normal grief do not require treatment. However, sometimes antidepressant medicine is given for bereavement to ease the depressive symptoms until they resolve. The treatment for clinical depression depends on how bad the symptoms are but often includes antidepressant medicine, counseling with a mental health professional, or both. Your health care provider will help to determine what treatment is best for you. Depression caused by physical illness usually goes away with appropriate medical treatment of the illness. If prescription medicine is causing depression, talk with your health care provider about stopping the medicine, decreasing the dose, or changing to another medicine. Depression caused by the abuse of alcohol or illicit drugs goes away when you stop using these substances. Some adults need professional help in order to stop drinking or using drugs. SEEK IMMEDIATE MEDICAL CARE IF:  You have thoughts about hurting yourself or others.  You lose touch with reality (have psychotic symptoms).  You are taking medicine for depression and have a serious side effect. FOR MORE INFORMATION  National [employer] on Mental Illness: www.nami.[employer] of Mental [employer]: https://carter.com/ Document Released: 05/21/2000 Document Revised: 10/08/2013 Document Reviewed: 08/23/2011 [facility] Patient Information 2015 [place], [place]. This information is not intended to replace advice given to you by your  health care provider. Make sure you discuss any questions you have  with your health care provider.  Cannabis Use Disorder Cannabis use disorder is a mental disorder. It is not one-time or occasional use of cannabis, more commonly known as marijuana. Cannabis use disorder is the continued, nonmedical use of cannabis that interferes with normal life activities or causes health problems. People with cannabis use disorder get a feeling of extreme pleasure and relaxation from cannabis use. This "high" is very rewarding and causes people to use over and over.  The mind-altering ingredient in cannabis is know as THC. THC can also interfere with motor coordination, memory, judgment, and accurate sense of space and time. These effects can last for a few days after using cannabis. Regular heavy cannabis use can cause long-lasting problems with thinking and learning. In young people, these problems may be permanent. Cannabis sometimes causes severe anxiety, paranoia, or visual hallucinations. Man-made (synthetic) cannabis-like drugs, such as "spice" and "K2," cause the same effects as THC but are much stronger. Cannabis-like drugs can cause dangerously high blood pressure and heart rate.  Cannabis use disorder usually starts in the teenage years. It can trigger the development of schizophrenia. It is somewhat more common in men than women. People who have family members with the disorder or existing mental health issues such as depression and posttraumatic stress disorderare more likely to develop cannabis use disorder. People with cannabis use disorder are at higher risk for use of other drugs of abuse.  SIGNS AND SYMPTOMS Signs and symptoms of cannabis use disorder include:   Use of cannabis in larger amounts or over a longer period than intended.   Unsuccessful attempts to cut down or control cannabis use.   A lot of time spent obtaining, using, or recovering from the effects of cannabis.   A strong desire or urge to use cannabis (cravings).   Continued use of  cannabis in spite of problems at work, school, or home because of use.   Continued use of cannabis in spite of relationship problems because of use.  Giving up or cutting down on important life activities because of cannabis use.  Use of cannabis over and over even in situations when it is physically hazardous, such as when driving a car.   Continued use of cannabis in spite of a physical problem that is likely related to use. Physical problems can include:  Chronic cough.  Bronchitis.  Emphysema.  Throat and lung cancer.  Continued use of cannabis in spite of a mental problem that is likely related to use. Mental problems can include:  Psychosis.  Anxiety.  Difficulty sleeping.  Need to use more and more cannabis to get the same effect, or lessened effect over time with use of the same amount (tolerance).  Having withdrawal symptoms when cannabis use is stopped, or using cannabis to reduce or avoid withdrawal symptoms. Withdrawal symptoms include:  Irritability or anger.  Anxiety or restlessness.  Difficulty sleeping.  Loss of appetite or weight.  Aches and pains.  Shakiness.  Sweating.  Chills. DIAGNOSIS Cannabis use disorder is diagnosed by your health care provider. You may be asked questions about your cannabis use and how it affects your life. A physical exam may be done. A drug screen may be done. You may be referred to a mental health professional. The diagnosis of cannabis use disorder requires at least two symptoms within 12 months. The type of cannabis use disorder you have depends on the number of symptoms you  have. The type may be:  Mild. Two or three signs and symptoms.   Moderate. Four or five signs and symptoms.   Severe. Six or more signs and symptoms.  TREATMENT Treatment is usually provided by mental health professionals with training in substance use disorders. The following options are available:  Counseling or talk therapy. Talk  therapy addresses the reasons you use cannabis. It also addresses ways to keep you from using again. The goals of talk therapy include:  Identifying and avoiding triggers for use.  Learning how to handle cravings.  Replacing use with healthy activities.  Support groups. Support groups provide emotional support, advice, and guidance.  Medicine. Medicine is used to treat mental health issues that trigger cannabis use or that result from it. HOME CARE INSTRUCTIONS  Take medicines only as directed by your health care provider.  Check with your health care provider before starting any new medicines.  Keep all follow-up visits as directed by your health care provider. SEEK MEDICAL CARE IF:  You are not able to take your medicines as directed.  Your symptoms get worse. SEEK IMMEDIATE MEDICAL CARE IF: You have serious thoughts about hurting yourself or others. [place] on Drug Abuse: motorcyclefax.com  Substance Abuse and Mental Health Services Administration: ktimeonline.com Document Released: 05/21/2000 Document Revised: 10/08/2013 Document Reviewed: 06/06/2013 [facility] Patient Information 2015 [place], [place]. This information is not intended to replace advice given to you by your health care provider. Make sure you discuss any questions you have with your health care provider.
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
## 2014-05-03 ENCOUNTER — Encounter (HOSPITAL_COMMUNITY): Payer: Self-pay

## 2014-05-03 ENCOUNTER — Emergency Department (HOSPITAL_COMMUNITY): Payer: Medicaid Other

## 2014-05-03 ENCOUNTER — Observation Stay (HOSPITAL_COMMUNITY)
Admission: EM | Admit: 2014-05-03 | Discharge: 2014-05-04 | Disposition: A | Discharge status: Home / Self Care | Payer: Medicaid Other | Attending: Oncology | Admitting: Oncology

## 2014-05-03 ENCOUNTER — Observation Stay (HOSPITAL_COMMUNITY): Payer: Medicaid Other

## 2014-05-03 DIAGNOSIS — Z720 Tobacco use: Secondary | ICD-10-CM | POA: Insufficient documentation

## 2014-05-03 DIAGNOSIS — K219 Gastro-esophageal reflux disease without esophagitis: Secondary | ICD-10-CM | POA: Diagnosis not present

## 2014-05-03 DIAGNOSIS — F319 Bipolar disorder, unspecified: Secondary | ICD-10-CM | POA: Diagnosis not present

## 2014-05-03 DIAGNOSIS — R079 Chest pain, unspecified: Secondary | ICD-10-CM | POA: Diagnosis not present

## 2014-05-03 DIAGNOSIS — I2510 Atherosclerotic heart disease of native coronary artery without angina pectoris: Secondary | ICD-10-CM | POA: Diagnosis not present

## 2014-05-03 DIAGNOSIS — Z8701 Personal history of pneumonia (recurrent): Secondary | ICD-10-CM | POA: Insufficient documentation

## 2014-05-03 DIAGNOSIS — R0602 Shortness of breath: Secondary | ICD-10-CM | POA: Insufficient documentation

## 2014-05-03 DIAGNOSIS — R001 Bradycardia, unspecified: Secondary | ICD-10-CM

## 2014-05-03 DIAGNOSIS — F1910 Other psychoactive substance abuse, uncomplicated: Secondary | ICD-10-CM

## 2014-05-03 DIAGNOSIS — Z79899 Other long term (current) drug therapy: Secondary | ICD-10-CM | POA: Diagnosis not present

## 2014-05-03 DIAGNOSIS — R0789 Other chest pain: Secondary | ICD-10-CM

## 2014-05-03 DIAGNOSIS — R071 Chest pain on breathing: Secondary | ICD-10-CM

## 2014-05-03 DIAGNOSIS — R1013 Epigastric pain: Secondary | ICD-10-CM

## 2014-05-03 HISTORY — DX: Unspecified osteoarthritis, unspecified site: M19.90

## 2014-05-03 HISTORY — DX: Gastric ulcer, unspecified as acute or chronic, without hemorrhage or perforation: K25.9

## 2014-05-03 LAB — PROTIME-INR
INR: 1.12 (ref 0.00–1.49)
Prothrombin Time: 14.5 seconds (ref 11.6–15.2)

## 2014-05-03 LAB — BASIC METABOLIC PANEL
Anion gap: 11 (ref 5–15)
BUN: 17 mg/dL (ref 6–23)
CALCIUM: 9 mg/dL (ref 8.4–10.5)
CO2: 27 mEq/L (ref 19–32)
Chloride: 101 mEq/L (ref 96–112)
Creatinine, Ser: 1.21 mg/dL (ref 0.50–1.35)
GFR calc Af Amer: 80 mL/min — ABNORMAL LOW (ref >90)
GFR calc non Af Amer: 69 mL/min — ABNORMAL LOW (ref >90)
Glucose, Bld: 102 mg/dL — ABNORMAL HIGH (ref 70–99)
Potassium: 4.4 mEq/L (ref 3.7–5.3)
SODIUM: 139 meq/L (ref 137–147)

## 2014-05-03 LAB — HEPATIC FUNCTION PANEL
ALT: 13 U/L (ref 0–53)
AST: 21 U/L (ref 0–37)
Albumin: 3.1 g/dL — ABNORMAL LOW (ref 3.5–5.2)
Alkaline Phosphatase: 64 U/L (ref 39–117)
Total Bilirubin: 0.3 mg/dL (ref 0.3–1.2)
Total Protein: 5.9 g/dL — ABNORMAL LOW (ref 6.0–8.3)

## 2014-05-03 LAB — CBC WITH DIFFERENTIAL/PLATELET
BASOS ABS: 0 10*3/uL (ref 0.0–0.1)
BASOS PCT: 0 % (ref 0–1)
Basophils Absolute: 0 10*3/uL (ref 0.0–0.1)
Basophils Relative: 0 % (ref 0–1)
Eosinophils Absolute: 0.1 10*3/uL (ref 0.0–0.7)
Eosinophils Absolute: 0.1 10*3/uL (ref 0.0–0.7)
Eosinophils Relative: 3 % (ref 0–5)
Eosinophils Relative: 2 % (ref 0–5)
HCT: 34.4 % — ABNORMAL LOW (ref 39.0–52.0)
HEMATOCRIT: 33.8 % — AB (ref 39.0–52.0)
Hemoglobin: 11.9 g/dL — ABNORMAL LOW (ref 13.0–17.0)
Hemoglobin: 11.6 g/dL — ABNORMAL LOW (ref 13.0–17.0)
Lymphocytes Relative: 27 % (ref 12–46)
Lymphocytes Relative: 39 % (ref 12–46)
Lymphs Abs: 1.1 10*3/uL (ref 0.7–4.0)
Lymphs Abs: 1.5 10*3/uL (ref 0.7–4.0)
MCH: 30.9 pg (ref 26.0–34.0)
MCH: 30.5 pg (ref 26.0–34.0)
MCHC: 34.6 g/dL (ref 30.0–36.0)
MCHC: 34.3 g/dL (ref 30.0–36.0)
MCV: 89.4 fL (ref 78.0–100.0)
MCV: 88.9 fL (ref 78.0–100.0)
MONO ABS: 0.5 10*3/uL (ref 0.1–1.0)
Monocytes Absolute: 0.3 10*3/uL (ref 0.1–1.0)
Monocytes Relative: 11 % (ref 3–12)
Monocytes Relative: 9 % (ref 3–12)
NEUTROS ABS: 2.4 10*3/uL (ref 1.7–7.7)
NEUTROS ABS: 1.9 10*3/uL (ref 1.7–7.7)
Neutrophils Relative %: 59 % (ref 43–77)
Neutrophils Relative %: 50 % (ref 43–77)
Platelets: 179 10*3/uL (ref 150–400)
Platelets: 170 10*3/uL (ref 150–400)
RBC: 3.85 MIL/uL — ABNORMAL LOW (ref 4.22–5.81)
RBC: 3.8 MIL/uL — ABNORMAL LOW (ref 4.22–5.81)
RDW: 12.8 % (ref 11.5–15.5)
RDW: 12.8 % (ref 11.5–15.5)
WBC: 4.2 10*3/uL (ref 4.0–10.5)
WBC: 3.8 10*3/uL — AB (ref 4.0–10.5)

## 2014-05-03 LAB — LIPID PANEL
CHOLESTEROL: 159 mg/dL (ref 0–200)
HDL: 98 mg/dL (ref >39)
LDL Cholesterol: 42 mg/dL (ref 0–99)
TRIGLYCERIDES: 97 mg/dL (ref <150)
Total CHOL/HDL Ratio: 1.6 RATIO
VLDL: 19 mg/dL (ref 0–40)

## 2014-05-03 LAB — TROPONIN I
Troponin I: <0.3 ng/mL (ref <0.30)
Troponin I: <0.3 ng/mL (ref <0.30)
Troponin I: <0.3 ng/mL (ref <0.30)

## 2014-05-03 LAB — RAPID URINE DRUG SCREEN, HOSP PERFORMED
AMPHETAMINES: NOT DETECTED
Barbiturates: NOT DETECTED
Benzodiazepines: NOT DETECTED
COCAINE: NOT DETECTED
OPIATES: NOT DETECTED
Tetrahydrocannabinol: POSITIVE — AB

## 2014-05-03 LAB — OCCULT BLOOD X 1 CARD TO LAB, STOOL: FECAL OCCULT BLD: NEGATIVE

## 2014-05-03 LAB — HEMOGLOBIN A1C
Hgb A1c MFr Bld: 4.5 % (ref <5.7)
Mean Plasma Glucose: 82 mg/dL (ref <117)

## 2014-05-03 LAB — PRO B NATRIURETIC PEPTIDE: Pro B Natriuretic peptide (BNP): 40.2 pg/mL (ref 0–125)

## 2014-05-03 LAB — LIPASE, BLOOD: LIPASE: 39 U/L (ref 11–59)

## 2014-05-03 LAB — ETHANOL

## 2014-05-03 MED ORDER — SODIUM CHLORIDE 0.9 % IJ SOLN
3.0000 mL | Freq: Two times a day (BID) | INTRAMUSCULAR | Status: DC
  Administered 2014-05-03 (×2): 3 mL via INTRAVENOUS

## 2014-05-03 MED ORDER — PANTOPRAZOLE SODIUM 40 MG IV SOLR
40.0000 mg | Freq: Two times a day (BID) | INTRAVENOUS | Status: DC
  Administered 2014-05-03 – 2014-05-04 (×2): 40 mg via INTRAVENOUS
  Filled 2014-05-03 (×2): qty 40

## 2014-05-03 MED ORDER — IOHEXOL 350 MG/ML SOLN
100.0000 mL | Freq: Once | INTRAVENOUS | Status: AC | PRN
  Administered 2014-05-03: 100 mL via INTRAVENOUS

## 2014-05-03 MED ORDER — SODIUM CHLORIDE 0.9 % IV BOLUS (SEPSIS)
1000.0000 mL | Freq: Once | INTRAVENOUS | Status: AC
  Administered 2014-05-03: 1000 mL via INTRAVENOUS

## 2014-05-03 MED ORDER — FAMOTIDINE 20 MG PO TABS
20.0000 mg | ORAL_TABLET | Freq: Every day | ORAL | Status: DC
  Administered 2014-05-03: 20 mg via ORAL
  Filled 2014-05-03: qty 1

## 2014-05-03 MED ORDER — ENOXAPARIN SODIUM 40 MG/0.4ML ~~LOC~~ SOLN
40.0000 mg | Freq: Every day | SUBCUTANEOUS | Status: DC
  Administered 2014-05-03: 40 mg via SUBCUTANEOUS
  Filled 2014-05-03: qty 0.4

## 2014-05-03 MED ORDER — GI COCKTAIL ~~LOC~~
30.0000 mL | Freq: Once | ORAL | Status: AC
  Administered 2014-05-03: 30 mL via ORAL
  Filled 2014-05-03: qty 30

## 2014-05-03 MED ORDER — QUETIAPINE FUMARATE 50 MG PO TABS: 200.0000 mg | ORAL_TABLET | Freq: Every day | ORAL | Status: DC

## 2014-05-03 MED ORDER — MORPHINE SULFATE 2 MG/ML IJ SOLN
1.0000 mg | Freq: Once | INTRAMUSCULAR | Status: AC
  Administered 2014-05-03: 1 mg via INTRAVENOUS
  Filled 2014-05-03: qty 1

## 2014-05-03 MED ORDER — MORPHINE SULFATE 2 MG/ML IJ SOLN
1.0000 mg | Freq: Four times a day (QID) | INTRAMUSCULAR | Status: DC | PRN
  Administered 2014-05-03 – 2014-05-04 (×3): 1 mg via INTRAVENOUS
  Filled 2014-05-03 (×3): qty 1

## 2014-05-03 MED ORDER — FENTANYL CITRATE 0.05 MG/ML IJ SOLN
50.0000 ug | Freq: Once | INTRAMUSCULAR | Status: AC
  Administered 2014-05-03: 50 ug via INTRAVENOUS
  Filled 2014-05-03: qty 2

## 2014-05-03 MED ORDER — SODIUM CHLORIDE 0.9 % IV SOLN
INTRAVENOUS | Status: DC
  Administered 2014-05-03: 10:00:00 via INTRAVENOUS

## 2014-05-03 MED ORDER — PANTOPRAZOLE SODIUM 40 MG IV SOLR
40.0000 mg | Freq: Two times a day (BID) | INTRAVENOUS | Status: DC
  Administered 2014-05-03: 40 mg via INTRAVENOUS
  Filled 2014-05-03: qty 40

## 2014-05-03 MED ORDER — PANTOPRAZOLE SODIUM 40 MG PO TBEC
40.0000 mg | DELAYED_RELEASE_TABLET | Freq: Two times a day (BID) | ORAL | Status: DC
  Administered 2014-05-03: 40 mg via ORAL
  Filled 2014-05-03: qty 1

## 2014-05-03 NOTE — ED Notes (Signed)
Pt asked to go into pt's room by transport, who states that the pt is having increased pain. Pt states that his pain increased after receiving the GI cocktail.

## 2014-05-03 NOTE — Plan of Care (Signed)
Problem: Consults Goal: Chest Pain Patient Education (See Patient Education module for education specifics.) Outcome: Completed/Met Date Met:  05/03/14  Problem: Phase I Progression Outcomes Goal: Hemodynamically stable Outcome: Completed/Met Date Met:  05/03/14 Goal: MD aware of Cardiac Marker results Outcome: Completed/Met Date Met:  05/03/14 Goal: Voiding-avoid urinary catheter unless indicated Outcome: Completed/Met Date Met:  05/03/14     

## 2014-05-03 NOTE — Progress Notes (Signed)
Briefly seen and examined - 48 yo male with a history of mild, non-obstructive CAD to the LAD. No prior stents, no history of CABG. Comes in with upper mid-epigastric pain, sharp, stabbing, intermittent, present at rest. Worsened somewhat with GI cocktail. EKG is non-ischemic. Initial troponin is negative. Last cath in 2013 showed no obstructive disease, no change from prior cath. He has not used cocaine recently, drug screen only positive for THC.  Doubt this is cardiac pain, but more suspicious of gastric ulcer. Would obtain at least 1 more cardiac enzyme. If negative, no further cardiac work-up is recommended at this time.  Pixie Casino, MD, [facility] Attending Cardiologist [place]

## 2014-05-03 NOTE — Consult Note (Signed)
[facility] Consult: 3:28 PM 05/03/2014  LOS: 0 days    Referring Provider: Dr. Beryle Beams  Primary Care Physician:  [name], MD Primary Gastroenterologist: Althia Forts     Reason for Consultation:  Discomfort in epigastric and lower sternal region and nausea.   HPI: [name] [name] is a 48 y.o. male.  History of bipolar disorder, polysubstance abuse. Numerous evaluations for atypical chest pain, cardiac cath in March 48 -year-old no significant CAD other than some mild atherosclerosis of the LAD, normal EF at 55%.    Came to the ED today complaining of pain at the epigastrium/lower chest.  As a chronic problem that goes back years. He has been treated in the past with Zantac which seemed to help but he hasn't used this nor any of his other listed medications for at least a year. The pattern is to have the pain mostly in the morning associated with nausea. He does not vomit. He will drink a cup of coffee and the symptoms subside. They are less likely to occur throughout the rest of the day. He has no anorexia, no early satiety, he moves his bowels every day and they are brown.  There is no dysphagia He has never vomited or passed any blood or melenic stool.  Patient denies history of upper endoscopy. The history states a history of ulcer disease but patient is not clear on this either. There are no endoscopic or colonoscopic records in Epic.  About 3 times a week patient drinks 80 ounces of beer. In the last few days, the pain has become more intense, he still hasn't vomited. He tried taking Zantac as well as a couple of Advil today but they did not help. Normally he has not been using any NSAIDs nor the Zantac or a PPI. He came to the emergency room for evaluation today and was admitted to the medicine teaching  service. Symptoms actually worsened after being given a GI cocktail. They did improve after a dose of morphine.  His hemoglobin is 11.9, his MCV normal at 89, troponin I is normal. Patient's transaminases, bilirubin, alkaline phosphatase and lipase are not elevated Tox screen is positive for THC.  Systolic BP was in the 39J. CT angiogram of the chest, abdomen and pelvis shows chronic occlusion or stenosis of the left lower lobe pulmonary arteries with compensated flow via the bronchial arteries, question as to whether this is acquired or congenital finding.  There is no aortic dissection or aneurysm  Incidental fragmentation of the posterior right ilium and degenerative disc narrowing at L5-S1.    Past Medical History  Diagnosis Date  . Chest pain, mid sternal   . CAD in native artery     a. Nonobstructive cath 11/2007;  b. Presented with ST elevation - Nonobstructive cath 08/2011  . GERD (gastroesophageal reflux disease)   . Tobacco abuse   . Marijuana abuse     a. uses ~ 1x /wk or less  . History of cocaine abuse     a. quit ? 2009  . History of ETOH abuse  a. drinks 2 "40's" / wk  . Bradycardia     a. asymptomatic  . Bipolar disorder   . Syncope     a. 12/2010 - presumed to be vasovagal  . Abnormal ECG     a. early repolarization  . Coronary artery disease   . Pneumonia   . Stomach ulcer   . Arthritis     "my whole left side" (05/03/2014)    Past Surgical History  Procedure Laterality Date  . Cardiac catheterization  2009; 08/2011    [name] 08/06/2011    Prior to Admission medications   Medication Sig Start Date End Date Taking? Authorizing Provider  ibuprofen (ADVIL,MOTRIN) 200 MG tablet Take 400 mg by mouth every 6 (six) hours as needed for moderate pain.   Yes Historical Provider, MD  ranitidine (ZANTAC) 150 MG tablet Take 150 mg by mouth daily as needed for heartburn.   Yes Historical Provider, MD  QUEtiapine (SEROQUEL) 200 MG tablet Take 1 tablet (200 mg total) by  mouth at bedtime. Patient not taking: Reported on 05/03/2014 04/23/14   Shuvon Rankin, NP    Scheduled Meds: . pantoprazole (PROTONIX) IV  40 mg Intravenous Q12H  . sodium chloride  3 mL Intravenous Q12H   Infusions: . sodium chloride 100 mL/hr at 05/03/14 0942   PRN Meds: morphine injection   Allergies as of 05/03/2014 - Review Complete 05/03/2014  Allergen Reaction Noted  . Aspirin  01/10/2012  . Pepperoni [pickled meat] Other (See Comments) 06/07/2012  . Tomato Other (See Comments) 06/07/2012  . Tylenol [acetaminophen]  01/24/2012    History reviewed. No pertinent family history.  History   Social History  . Marital Status: Single    Spouse Name: N/A    Number of Children: N/A  . Years of Education: N/A   Occupational History  . Not on file.   Social History Main Topics  . Smoking status: Current Every Day Smoker -- 0.50 packs/day for 20 years    Types: Cigarettes  . Smokeless tobacco: Never Used  . Alcohol Use: 28.8 oz/week    48 Cans of beer per week     Comment: 05/03/2014 "2 40oz beers/day"  . Drug Use: Yes    Special: Marijuana, Cocaine     Comment: 05/03/2014 "last marijuana was ~ 3 days ago; last cocaine was in 2014"  . Sexual Activity: Yes   Other Topics Concern  . Not on file   Social History Narrative   Lives with his daughter currently and receives SSI.     REVIEW OF SYSTEMS: Constitutional:  Weight is stable, cloths fit the same as they have for many years.  ENT:  No nose bleeds, no congestion  Pulm:  No cough, no DOE.  CV:  No palpitations, no LE edema.  GU:  No hematuria, no frequency GI:  Per history of present illness  Heme:  No history of low blood counts.   Transfusions:  None  Neuro:  No headaches, no peripheral tingling or numbness Derm:  No itching, no rash or sores.  Endocrine:  No sweats or chills.  No polyuria or dysuria Immunization:  Not queried  Travel:  None beyond local counties in last few months.    PHYSICAL  EXAM: Vital signs in last 24 hours: Filed Vitals:   05/03/14 1405  BP: 98/54  Pulse: 44  Temp: 98.2 F (36.8 C)  Resp: 16   Wt Readings from Last 3 Encounters:  05/03/14 125 lb (56.7 kg)  04/21/14 132  lb (59.875 kg)  09/23/13 130 lb (58.968 kg)    General: Pleasant, thin but not cachectic appearing AAM who is comfortable and does not look acutely ill  Head:  No swelling, asymmetry or signs of trauma  Eyes:  No icterus or pallor  Ears:  Not hard of hearing  Nose:  No congestion, no discharge  Mouth:  Dentition remarkable for few remaining teeth. Mucosa is clear and moist  Neck:  No thyromegaly, no bruits, no JVD, no masses  Lungs:  Clear to auscultation and percussion bilaterally. No cough no dyspnea  Heart: RRR. No MRG. S1 and S2 audible  Abdomen:  Soft, active bowel sounds, nondistended. There is tenderness in the epigastric region and left greater than right upper quadrant. This is moderate at most. No guarding or rebound.   Rectal: Rectal exam deferred   Musc/Skeltl: No joint swelling, deformity or redness  Extremities:  No pedal edema .  Pedal pulses are brisk. Feet are warm.  Neurologic:  Patient is alert and oriented 3. No tremor, no limb weakness or asymmetric strength.  Skin:  No rash, no telangiectasia, purpura or hematoma.  There is a small healed scab near the left thumb from where he had a blister develop after raking leaves Tattoos:  None Nodes:  No cervical adenopathy   Psych:  Pleasant, cooperative, relaxed  Intake/Output from previous day:   Intake/Output this shift:    LAB RESULTS:  Recent Labs  05/03/14 0427 05/03/14 1125  WBC 4.2 3.8*  HGB 11.9* 11.6*  HCT 34.4* 33.8*  PLT 179 170   BMET Lab Results  Component Value Date   NA 139 05/03/2014   NA 139 04/21/2014   NA 141 09/23/2013   K 4.4 05/03/2014   K 3.6* 04/21/2014   K 3.8 09/23/2013   CL 101 05/03/2014   CL 101 04/21/2014   CL 102 09/23/2013   CO2 27 05/03/2014   CO2 25  04/21/2014   CO2 23 09/23/2013   GLUCOSE 102* 05/03/2014   GLUCOSE 100* 04/21/2014   GLUCOSE 83 09/23/2013   BUN 17 05/03/2014   BUN 9 04/21/2014   BUN 12 09/23/2013   CREATININE 1.21 05/03/2014   CREATININE 1.09 04/21/2014   CREATININE 1.04 09/23/2013   CALCIUM 9.0 05/03/2014   CALCIUM 9.2 04/21/2014   CALCIUM 9.2 09/23/2013   LFT  Recent Labs  05/03/14 1125  PROT 5.9*  ALBUMIN 3.1*  AST 21  ALT 13  ALKPHOS 64  BILITOT 0.3  BILIDIR <0.2  IBILI NOT CALCULATED   PT/INR Lab Results  Component Value Date   INR 1.12 05/03/2014   INR 1.15 09/19/2011   INR 1.18 08/16/2011   Hepatitis Panel No results for input(s): HEPBSAG, HCVAB, HEPAIGM, HEPBIGM in the last 72 hours. C-Diff No components found for: CDIFF Lipase     Component Value Date/Time   LIPASE 39 05/03/2014 0950    Drugs of Abuse     Component Value Date/Time   LABOPIA NONE DETECTED 05/03/2014 0729   COCAINSCRNUR NONE DETECTED 05/03/2014 0729   LABBENZ NONE DETECTED 05/03/2014 0729   AMPHETMU NONE DETECTED 05/03/2014 0729   THCU POSITIVE* 05/03/2014 0729   LABBARB NONE DETECTED 05/03/2014 0729     RADIOLOGY STUDIES: Dg Chest 2 View  05/03/2014   CLINICAL DATA:  Central chest pain with burning sensation for 3 days. 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 History of myocardial infarction and stents.  EXAM: CHEST  2 VIEW  COMPARISON:  Chest radiograph Oct 17, 2012  FINDINGS: The cardiac silhouette is upper limits of normal in size, mediastinal silhouette is nonsuspicious. The lungs are clear without pleural effusions or focal consolidations. Trachea projects midline and there is no pneumothorax. Soft tissue planes and included osseous structures are non-suspicious.  IMPRESSION: Borderline cardiomegaly, no acute pulmonary process.   Electronically Signed   By: Elon Alas   On: 05/03/2014 05:35   Ct Angio Chest Aorta W/cm &/or Wo/cm  05/03/2014   CLINICAL DATA:  Severe mid to lower chest pain.  Concern for dissection.  EXAM: CT ANGIOGRAPHY CHEST, ABDOMEN AND PELVIS  TECHNIQUE: Multidetector CT imaging through the chest, abdomen and pelvis was performed using the standard protocol during bolus administration of intravenous contrast. Multiplanar reconstructed images and MIPs were obtained and reviewed to evaluate the vascular anatomy.  CONTRAST:  139mL OMNIPAQUE IOHEXOL 350 MG/ML SOLN  COMPARISON:  Abdominal CT 03/20/2012  FINDINGS: CTA CHEST FINDINGS  THORACIC INLET/BODY WALL:  Prominent venous collaterals in the left chest, likely from arm positioning.  MEDIASTINUM:  Normal heart size. No pericardial effusion. No visible coronary calcification. No evidence of intramural aortic hematoma no aortic aneurysm or dissection in the chest. No main pulmonary embolism.  Unusual appearance of the left lower lobe pulmonary arteries which appear diminutive. The arteries neighboring the lower lobe bronchi are asymmetrically bright, and likely arise from the enlarged bronchial artery in this location. There is suggestion of these findings also seen on 03/20/2012 abdominal CT.  LUNG WINDOWS:  No consolidation. No effusion. No suspicious pulmonary nodule. Atelectatic changes in the left lower lobe, possibly ischemic.  OSSEOUS:  No acute fracture.  No suspicious lytic or blastic lesions.  Review of the MIP images confirms the above findings.  CTA ABDOMEN AND PELVIS FINDINGS  BODY WALL: Unremarkable.  Liver: No focal abnormality.  Biliary: No evidence of biliary obstruction or stone.  Pancreas: Pancreatic duct is at the upper limits of normal. No evidence of mass or pancreatitis, either acute or chronic.  Spleen: Unremarkable.  Adrenals: Unremarkable.  Kidneys and ureters: No hydronephrosis or stone.  Bladder: Unremarkable.  Reproductive: Unremarkable.  Bowel: No obstruction. No right lower quadrant inflammatory changes.  Retroperitoneum: No mass or adenopathy.  Peritoneum: No ascites or pneumoperitoneum.  Vascular: There is  a unusual anastomosis between the IMA and the gastro-omental vessels. No vessel occlusion to explain this finding. Otherwise, visceral aortic branching is standard. There is no aortic aneurysm or dissection.  OSSEOUS: Chronic fragmentation of the posterior right ilium. Focal degenerative disc narrowing at L5-S1  Review of the MIP images confirms the above findings.  IMPRESSION: 1. No aortic dissection.  No aortic aneurysm. 2. 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 It is unclear if this is acquired or congenital.   Electronically Signed   By: Jorje Guild M.D.   On: 05/03/2014 07:18   Ct Angio Abd/pel W/ And/or W/o  05/03/2014   CLINICAL DATA:  Severe mid to lower chest pain. Concern for dissection.  EXAM: CT ANGIOGRAPHY CHEST, ABDOMEN AND PELVIS  TECHNIQUE: Multidetector CT imaging through the chest, abdomen and pelvis was performed using the standard protocol during bolus administration of intravenous contrast. Multiplanar reconstructed images and MIPs were obtained and reviewed to evaluate the vascular anatomy.  CONTRAST:  156mL OMNIPAQUE IOHEXOL 350 MG/ML SOLN  COMPARISON:  Abdominal CT 03/20/2012  FINDINGS: CTA CHEST FINDINGS  THORACIC INLET/BODY WALL:  Prominent venous collaterals in the left chest, likely from arm  positioning.  MEDIASTINUM:  Normal heart size. No pericardial effusion. No visible coronary calcification. No evidence of intramural aortic hematoma no aortic aneurysm or dissection in the chest. No main pulmonary embolism.  Unusual appearance of the left lower lobe pulmonary arteries which appear diminutive. The arteries neighboring the lower lobe bronchi are asymmetrically bright, and likely arise from the enlarged bronchial artery in this location. There is suggestion of these findings also seen on 03/20/2012 abdominal CT.  LUNG WINDOWS:  No consolidation. No effusion. No suspicious pulmonary nodule. Atelectatic  changes in the left lower lobe, possibly ischemic.  OSSEOUS:  No acute fracture.  No suspicious lytic or blastic lesions.  Review of the MIP images confirms the above findings.  CTA ABDOMEN AND PELVIS FINDINGS  BODY WALL: Unremarkable.  Liver: No focal abnormality.  Biliary: No evidence of biliary obstruction or stone.  Pancreas: Pancreatic duct is at the upper limits of normal. No evidence of mass or pancreatitis, either acute or chronic.  Spleen: Unremarkable.  Adrenals: Unremarkable.  Kidneys and ureters: No hydronephrosis or stone.  Bladder: Unremarkable.  Reproductive: Unremarkable.  Bowel: No obstruction. No right lower quadrant inflammatory changes.  Retroperitoneum: No mass or adenopathy.  Peritoneum: No ascites or pneumoperitoneum.  Vascular: There is a unusual anastomosis between the IMA and the gastro-omental vessels. No vessel occlusion to explain this finding. Otherwise, visceral aortic branching is standard. There is no aortic aneurysm or dissection.  OSSEOUS: Chronic fragmentation of the posterior right ilium. Focal degenerative disc narrowing at L5-S1  Review of the MIP images confirms the above findings.  IMPRESSION: 1. No aortic dissection.  No aortic aneurysm. 2. 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 It is unclear if this is acquired or congenital.   Electronically Signed   By: Jorje Guild M.D.   On: 05/03/2014 07:18    ENDOSCOPIC STUDIES:  No EGD or colonoscopy ever  IMPRESSION:   *  Recent worsening of chronic epigastric/lower sternal chest pain. He's had the symptoms for years. Given the pattern of occurrence mostly in the morning, suspect this is GERD versus peptic ulcer disease. He is not taking any stomach protective medication but Protonix 40 mg IV every 12 hours has been initiated.     PLAN:     *  Keep on that twice a day IV Protonix for now, can probably switch to oral dosing tomorrow. *   Not clear that he will require an upper endoscopy as he has no worrisome findings such as passing or vomiting blood, weight loss, or nicking anemia.  However, in case MD wishes to pursue EGD, I ordered pt NPO after midnight to allow for EGD, if desired, tomorrow.    Azucena Freed  05/03/2014, 3:28 PM Pager: [phone]  GI Attending Note   Chart was reviewed and patient was examined. X-rays and lab were reviewed.   Epigastric pain is likely due to gastritis, EtOH and NSAID- induced.  He likely has GERD and may have  Active PUD.  Would avoid these irritants and keep on PPI therapy for 4-6 weeks.  EGD only if he does not improve over next few days.  Sandy Salaam. Deatra Ina, M.D., [facility] Cell [phone]

## 2014-05-03 NOTE — ED Notes (Signed)
Pt states that he is always told that his BP is low when he comes to the hospital.

## 2014-05-03 NOTE — ED Notes (Signed)
Attempted report 

## 2014-05-03 NOTE — ED Notes (Signed)
Pt comes via [facility] from home, pt having CP that awakened him, hx of MI and stents, PTA had 3 nitro, asa, and fentanyl, with little relief. C/o nausea, denies vomiting and SOB

## 2014-05-03 NOTE — ED Provider Notes (Signed)
CSN: [id number]     Arrival date & time 05/03/14  0334 History   First MD Initiated Contact with Patient 05/03/14 [phone]     Chief Complaint  Patient presents with  . Chest Pain     (Consider location/radiation/quality/duration/timing/severity/associated sxs/prior Treatment) Patient is a 48 y.o. male presenting with chest pain. The history is provided by the patient.  Chest Pain Pain location:  Substernal area Pain quality: burning, pressure and sharp   Pain radiates to:  Does not radiate Pain radiates to the back: no   Pain severity:  Severe Onset quality:  Sudden Timing:  Constant Progression:  Unchanged Chronicity:  New Context: at rest   Context: not breathing and no drug use   Relieved by:  Nothing Worsened by:  Nothing tried Associated symptoms: shortness of breath   Associated symptoms: no abdominal pain, no cough, no fever, no nausea and not vomiting     Past Medical History  Diagnosis Date  . Chest pain, mid sternal   . CAD in native artery     a. Nonobstructive cath 11/2007;  b. Presented with ST elevation - Nonobstructive cath 08/2011  . GERD (gastroesophageal reflux disease)   . Tobacco abuse   . Marijuana abuse     a. uses ~ 1x /wk or less  . History of cocaine abuse     a. quit ? 2009  . History of ETOH abuse     a. drinks 2 "40's" / wk  . Bradycardia     a. asymptomatic  . Bipolar disorder   . Syncope     a. 12/2010 - presumed to be vasovagal  . Abnormal ECG     a. early repolarization  . Coronary artery disease   . Pneumonia    Past Surgical History  Procedure Laterality Date  . Coronary angioplasty with stent placement     No family history on file. History  Substance Use Topics  . Smoking status: Current Every Day Smoker -- 0.50 packs/day for 20 years    Types: Cigarettes  . Smokeless tobacco: Not on file     Comment: Smokes about 3 cigarettes/day  . Alcohol Use: Yes     Comment: 4-5 40oz/day    Review of Systems  Constitutional:  Negative for fever and chills.  Respiratory: Positive for shortness of breath. Negative for cough.   Cardiovascular: Positive for chest pain.  Gastrointestinal: Negative for nausea, vomiting and abdominal pain.  All other systems reviewed and are negative.     Allergies  Aspirin; Pepperoni; Tomato; and Tylenol  Home Medications   Prior to Admission medications   Medication Sig Start Date End Date Taking? Authorizing Provider  ibuprofen (ADVIL,MOTRIN) 200 MG tablet Take 400 mg by mouth every 6 (six) hours as needed for moderate pain.   Yes Historical Provider, MD  ranitidine (ZANTAC) 150 MG tablet Take 150 mg by mouth daily as needed for heartburn.   Yes Historical Provider, MD  QUEtiapine (SEROQUEL) 200 MG tablet Take 1 tablet (200 mg total) by mouth at bedtime. Patient not taking: Reported on 05/03/2014 04/23/14   Shuvon Rankin, NP   BP 94/59 mmHg  Pulse 43  Temp(Src) 98.6 F (37 C) (Oral)  Resp 15  Ht 5\' 4"  (1.626 m)  Wt 125 lb (56.7 kg)  BMI 21.45 kg/m2  SpO2 99% Physical Exam  Constitutional: He is oriented to person, place, and time. He appears well-developed and well-nourished. No distress.  HENT:  Head: Normocephalic and atraumatic.  Mouth/Throat: Oropharynx  is clear and moist. No oropharyngeal exudate.  Eyes: EOM are normal. Pupils are equal, round, and reactive to light.  Neck: Normal range of motion. Neck supple.  Cardiovascular: Regular rhythm.  Bradycardia present.  Exam reveals no friction rub.   No murmur heard. Pulmonary/Chest: Effort normal and breath sounds normal. No respiratory distress. He has no wheezes. He has no rales.  Abdominal: Soft. He exhibits no distension. There is no tenderness. There is no rebound.  Musculoskeletal: Normal range of motion. He exhibits no edema.  Neurological: He is alert and oriented to person, place, and time.  Skin: No rash noted. He is not diaphoretic.  Nursing note and vitals reviewed.   ED Course  Procedures  (including critical care time) Labs Review Labs Reviewed  CBC WITH DIFFERENTIAL - Abnormal; Notable for the following:    RBC 3.85 (*)    Hemoglobin 11.9 (*)    HCT 34.4 (*)    All other components within normal limits  BASIC METABOLIC PANEL - Abnormal; Notable for the following:    Glucose, Bld 102 (*)    GFR calc non Af Amer 69 (*)    GFR calc Af Amer 80 (*)    All other components within normal limits  TROPONIN I  PRO B NATRIURETIC PEPTIDE  URINE RAPID DRUG SCREEN (HOSP PERFORMED)    Imaging Review Dg Chest 2 View  05/03/2014   CLINICAL DATA:  Central chest pain with burning sensation for 3 days. 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 History of myocardial infarction and stents.  EXAM: CHEST  2 VIEW  COMPARISON:  Chest radiograph Oct 17, 2012  FINDINGS: The cardiac silhouette is upper limits of normal in size, mediastinal silhouette is nonsuspicious. The lungs are clear without pleural effusions or focal consolidations. Trachea projects midline and there is no pneumothorax. Soft tissue planes and included osseous structures are non-suspicious.  IMPRESSION: Borderline cardiomegaly, no acute pulmonary process.   Electronically Signed   By: Elon Alas   On: 05/03/2014 05:35     EKG Interpretation   Date/Time:  Friday May 03 2014 03:50:21 EST Ventricular Rate:  42 PR Interval:  191 QRS Duration: 84 QT Interval:  441 QTC Calculation: 368 R Axis:   48 Text Interpretation:  Sinus bradycardia RSR' in V1 or V2, probably normal  variant J point elevation, simialr to prior T wave infersion in V1/V2  similar to prior Confirmed by [facility]  MD, [place] ([id number]) on 05/03/2014  4:00:57 AM      MDM   Final diagnoses:  Chest pain    76M with hx of CABG, MI with stents noncompliant with meds presents with CP. Began as epigastric burning, has progressed to sharp/pressure like pain over past few days. Here hiccupping frequently. With EMS was diaphoretic, bradycardic in the  40s. Here bradycardic in the 40s. BPs soft. Lungs clear. EKG similar to prior with some TWIs anteriorly and J point elevation in multiple leads. Plan on admission due to his CP and serious cardiac history.  Plan for aortic dissection scan.  Internal Medicine teaching service admitting.  Evelina Bucy, MD 05/03/14 [phone]

## 2014-05-03 NOTE — Progress Notes (Signed)
UR completed 

## 2014-05-03 NOTE — H&P (Signed)
Date: 05/03/2014               Patient Name:  [name] [name] MRN: [id number]  DOB: September 28, 1965 Age / Sex: 48 y.o., male   PCP: [name], MD         Medical Service: Internal Medicine Teaching Service         Attending Physician: Dr. Annia Belt, MD    First Contact: Dr. Charlott Rakes  Pager: [phone]  Second Contact: Dr. Bing Neighbors Pager: [phone]       After Hours (After 5p/  First Contact Pager: [phone]  weekends / holidays): Second Contact Pager: [phone]   Chief Complaint: chest pain  History of Present Illness: [name] [name] is a 48 year old male with history of non-obstructive CAD, bradycardia, polysubstance abuse (alcohol, THC, opiates), bipolar disorder who presents with chest pain.   Three days ago, he reported the onset of two separate pains - a sharp, mid-chest pain and a burning, epigastric pain - while he was laying down. He took 800mg  of some pill (Advil?) that he can't remember which made the pain worse altogether. He reports that he hasn't required any acid reflux medicine or NSAIDs over the past year and that he was told he had an ulcer during his most recent cardiac cath. This pain he feels is worse with hot and spicy foods but felt it improved with Protonix that he got once in the ED. He reports drinking beers occasionally as well as some associated dizziness and nausea (last meal was some [place] last night) but denies any dark stools, blood in his stool, vomiting, sick contacts, fever, history of gallbladder/pancreas disease, weight loss.  Per chart review, cardiac catheterization in 2009 was unremarkable for obstructive CAD but was repeated in 2013 and showed mild atherosclerotic changes of the middle LAD; EF was 55% at that time as well. He has multiple ED visits in the past for chest pain. Abdominal US in September 2013 showed splenic hyperechoicity but no hepatobiliary findings.  In the ED, he was given ASA, nitroglycerin, Fentanyl which mildly improved  his chest pain, and Cardiology was consulted. His burning pain worsened after the GI cocktail.    Meds: No current facility-administered medications for this encounter.   Current Outpatient Prescriptions  Medication Sig Dispense Refill  . ibuprofen (ADVIL,MOTRIN) 200 MG tablet Take 400 mg by mouth every 6 (six) hours as needed for moderate pain.    . ranitidine (ZANTAC) 150 MG tablet Take 150 mg by mouth daily as needed for heartburn.    Marland Kitchen QUEtiapine (SEROQUEL) 200 MG tablet Take 1 tablet (200 mg total) by mouth at bedtime. (Patient not taking: Reported on 05/03/2014) 14 tablet 0    Allergies: Allergies as of 05/03/2014 - Review Complete 05/03/2014  Allergen Reaction Noted  . Aspirin  01/10/2012  . Pepperoni [pickled meat] Other (See Comments) 06/07/2012  . Tomato Other (See Comments) 06/07/2012  . Tylenol [acetaminophen]  01/24/2012   Past Medical History  Diagnosis Date  . Chest pain, mid sternal   . CAD in native artery     a. Nonobstructive cath 11/2007;  b. Presented with ST elevation - Nonobstructive cath 08/2011  . GERD (gastroesophageal reflux disease)   . Tobacco abuse   . Marijuana abuse     a. uses ~ 1x /wk or less  . History of cocaine abuse     a. quit ? 2009  . History of ETOH abuse     a. drinks 2 "40's" /  wk  . Bradycardia     a. asymptomatic  . Bipolar disorder   . Syncope     a. 12/2010 - presumed to be vasovagal  . Abnormal ECG     a. early repolarization  . Coronary artery disease   . Pneumonia    Past Surgical History  Procedure Laterality Date  . Coronary angioplasty with stent placement     No family history on file. History   Social History  . Marital Status: Single    Spouse Name: N/A    Number of Children: N/A  . Years of Education: N/A   Occupational History  . Not on file.   Social History Main Topics  . Smoking status: Current Every Day Smoker -- 0.50 packs/day for 20 years    Types: Cigarettes  . Smokeless tobacco: Not on file      Comment: Smokes about 3 cigarettes/day  . Alcohol Use: Yes     Comment: 4-5 40oz/day  . Drug Use: Yes    Special: Marijuana  . Sexual Activity: Yes   Other Topics Concern  . Not on file   Social History Narrative   Lives with his daughter currently and receives SSI.     Review of Systems: Review of Systems  Constitutional: Negative for fever and weight loss.  Cardiovascular: Positive for chest pain.  Gastrointestinal: Positive for nausea and abdominal pain. Negative for vomiting, diarrhea, blood in stool and melena.  Neurological: Positive for dizziness.       Physical Exam: Blood pressure 140/85, pulse 42, temperature 98.6 F (37 C), temperature source Oral, resp. rate 21, height 5\' 4"  (1.626 m), weight 125 lb (56.7 kg), SpO2 100 %.  General: resting in bed, intermittently distressed with pain, 1L O2 by [place] HEENT: PERRL, EOMI, no scleral icterus, oropharynx clear though missing teeth Cardiac: RRR, no rubs, murmurs or gallops Pulm: clear to auscultation bilaterally, no wheezes, rales, or rhonchi Abd: BS present with no tenderness to palpation, Murphy's sign negative, rectal vault without gross blood Ext: warm and well perfused, no pedal edema, 2+ PT & radial pulses Neuro: responds to questions appropriately; moving all extremities freely, CN II-XII intact, 5/5 upper and lower extremity strength, 2+ grip strength, 2+ brachial/patellar reflexes, finger to nose & rapid alternating movements intact   Lab results: Basic Metabolic Panel:  Recent Labs  05/03/14 0427  NA 139  K 4.4  CL 101  CO2 27  GLUCOSE 102*  BUN 17  CREATININE 1.21  CALCIUM 9.0   CBC:  Recent Labs  05/03/14 0427  WBC 4.2  NEUTROABS 2.4  HGB 11.9*  HCT 34.4*  MCV 89.4  PLT 179   Cardiac Enzymes:  Recent Labs  05/03/14 0427  TROPONINI <0.30   BNP:  Recent Labs  05/03/14 0419  PROBNP 40.2   Urine Drug Screen: Drugs of Abuse     Component Value Date/Time   LABOPIA NONE  DETECTED 05/03/2014 0729   COCAINSCRNUR NONE DETECTED 05/03/2014 0729   LABBENZ NONE DETECTED 05/03/2014 0729   AMPHETMU NONE DETECTED 05/03/2014 0729   THCU POSITIVE* 05/03/2014 0729   LABBARB NONE DETECTED 05/03/2014 0729    Alcohol Level:  Recent Labs  05/03/14 0726  ETH <11    Imaging results:  Dg Chest 2 View  05/03/2014   CLINICAL DATA:  Central chest pain with burning sensation for 3 days. 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 History of myocardial infarction and stents.  EXAM: CHEST  2 VIEW  COMPARISON:  Chest radiograph Oct 17, 2012  FINDINGS: The cardiac silhouette is upper limits of normal in size, mediastinal silhouette is nonsuspicious. The lungs are clear without pleural effusions or focal consolidations. Trachea projects midline and there is no pneumothorax. Soft tissue planes and included osseous structures are non-suspicious.  IMPRESSION: Borderline cardiomegaly, no acute pulmonary process.   Electronically Signed   By: Elon Alas   On: 05/03/2014 05:35    Other results: EKG: Reviewed and compared with 09/23/13 Bradycardic: HR 42 J point elevations in V3-6: stable T-wave inversions in aVR: stable  Assessment & Plan by Problem: Mr. Morandi is a 48 year old male with history of non-obstructive CAD, bradycardia, polysubstance abuse (alcohol, THC, opiates) who presents with chest pain.  Chest pain: Symptoms concerning for gastric process, less likely cardiac. Unsure of why GI cocktail worsened his symptoms but improvement with Protonix is suggestive of GERD. Though he doesn't use chronic NSAIDs, he is at risk for PUD given GERD. Hb 12, baseline 13-15, though he was hypotensive with systolic BP trending in the 90s. Acute pancreatitis possible given h/o of EtOH use in the past though is unremarkable on this admission. Gallbladder disease less likely in the absence of fever, elevated white count, negative Murphy's sign.  -Monitor on telemetry -Check troponins x  3 -Check A1c, lipid panel -Keep NPO for now -Switch home Zantac for Protonix 40mg  BID -Recheck CBC q6h -Check FOBT, LFTs, lipase, PT/INR -Give 1L NS bolus and recheck BP -Continue morphine 1mg  but hold if systolic BP <29 -Consult GI  Bradycardia: HR trending in the 40s though he is not reporting dizziness at the moment.  -Monitor on telemetry   Polysubstance abuse: Urine THC+ but otherwise cocaine-, opiates-; EtOH-.  -Will inquire about tobacco use when he is in less distress  Bipolar disorder: H/o suicidal ideation in 2013 in the setting of multiple stressors and polysubstance abuse. Seroquel listed for home meds though has not picked up in over a year per Pharmacy.  -Will not start inpatient but will continue assessing  #FEN:  -Diet: NPO -NS @ 100cc/hr  #DVT prophylaxis: SCDs  #CODE STATUS: FULL CODE -Defer to his daughters Norwin Aleman, [place], [place]) if he is unable to make decision for himself -Confirmed with him at bedside  Dispo: Disposition is deferred at this time, awaiting improvement of current medical problems. Anticipated discharge in approximately 1-2 day(s).   The patient does have a current PCP (Sami Sheryle Hail, MD) and does need an [facility] hospital follow-up appointment after discharge.  The patient does not have transportation limitations that hinder transportation to clinic appointments.  Signed: Charlott Rakes, MD 05/03/2014, 10:42 AM

## 2014-05-03 NOTE — ED Notes (Signed)
Pt has returned from radiology.  

## 2014-05-03 NOTE — ED Notes (Signed)
Per charge RN Jessica on 3W, pt can be admitted to the floor if the pt's pain can be controlled with medication.

## 2014-05-03 NOTE — ED Notes (Signed)
Pt has returned from CT.  

## 2014-05-04 LAB — CBC WITH DIFFERENTIAL/PLATELET
BASOS ABS: 0 10*3/uL (ref 0.0–0.1)
BASOS PCT: 0 % (ref 0–1)
Eosinophils Absolute: 0.1 10*3/uL (ref 0.0–0.7)
Eosinophils Relative: 3 % (ref 0–5)
HCT: 34.8 % — ABNORMAL LOW (ref 39.0–52.0)
Hemoglobin: 11.7 g/dL — ABNORMAL LOW (ref 13.0–17.0)
LYMPHS PCT: 45 % (ref 12–46)
Lymphs Abs: 1.7 10*3/uL (ref 0.7–4.0)
MCH: 30.2 pg (ref 26.0–34.0)
MCHC: 33.6 g/dL (ref 30.0–36.0)
MCV: 89.7 fL (ref 78.0–100.0)
MONO ABS: 0.4 10*3/uL (ref 0.1–1.0)
MONOS PCT: 9 % (ref 3–12)
NEUTROS PCT: 43 % (ref 43–77)
Neutro Abs: 1.7 10*3/uL (ref 1.7–7.7)
Platelets: 162 10*3/uL (ref 150–400)
RBC: 3.88 MIL/uL — ABNORMAL LOW (ref 4.22–5.81)
RDW: 12.9 % (ref 11.5–15.5)
WBC: 3.9 10*3/uL — ABNORMAL LOW (ref 4.0–10.5)

## 2014-05-04 LAB — BASIC METABOLIC PANEL
ANION GAP: 10 (ref 5–15)
BUN: 9 mg/dL (ref 6–23)
CHLORIDE: 107 meq/L (ref 96–112)
CO2: 23 meq/L (ref 19–32)
CREATININE: 1.14 mg/dL (ref 0.50–1.35)
Calcium: 7.9 mg/dL — ABNORMAL LOW (ref 8.4–10.5)
GFR calc non Af Amer: 74 mL/min — ABNORMAL LOW (ref >90)
GFR, EST AFRICAN AMERICAN: 86 mL/min — AB (ref >90)
Glucose, Bld: 85 mg/dL (ref 70–99)
Potassium: 4.3 mEq/L (ref 3.7–5.3)
SODIUM: 140 meq/L (ref 137–147)

## 2014-05-04 MED ORDER — PANTOPRAZOLE SODIUM 40 MG PO TBEC: 40.0000 mg | DELAYED_RELEASE_TABLET | Freq: Every day | ORAL | Status: DC

## 2014-05-04 NOTE — Progress Notes (Signed)
Progress Note for [facility]  Subjective: Woke up this morning with epigastric pain and nausea, however, he was able to eat the majority of his breakfast.  Objective: Vital signs in last 24 hours: Temp:  [98 F (36.7 C)-98.2 F (36.8 C)] 98 F (36.7 C) (11/28 0522) Pulse Rate:  [44-49] 49 (11/28 0522) Resp:  [16] 16 (11/27 1405) BP: (98-109)/(54-68) 109/68 mmHg (11/28 0522) SpO2:  [100 %] 100 % (11/28 0522) Weight:  [55.792 kg (123 lb)] 55.792 kg (123 lb) (11/28 0522) Last BM Date: 05/03/14  Intake/Output from previous day: 11/27 0701 - 11/28 0700 In: 1920 [I.V.:1920] Out: -  Intake/Output this shift:    General appearance: alert and no distress GI: some mild epigastric tenderness  Lab Results:  Recent Labs  05/03/14 0427 05/03/14 1125 05/04/14 0330  WBC 4.2 3.8* 3.9*  HGB 11.9* 11.6* 11.7*  HCT 34.4* 33.8* 34.8*  PLT 179 170 162   BMET  Recent Labs  05/03/14 0427 05/04/14 0330  NA 139 140  K 4.4 4.3  CL 101 107  CO2 27 23  GLUCOSE 102* 85  BUN 17 9  CREATININE 1.21 1.14  CALCIUM 9.0 7.9*   LFT  Recent Labs  05/03/14 1125  PROT 5.9*  ALBUMIN 3.1*  AST 21  ALT 13  ALKPHOS 64  BILITOT 0.3  BILIDIR <0.2  IBILI NOT CALCULATED   PT/INR  Recent Labs  05/03/14 1125  LABPROT 14.5  INR 1.12   Hepatitis Panel No results for input(s): HEPBSAG, HCVAB, HEPAIGM, HEPBIGM in the last 72 hours. C-Diff No results for input(s): CDIFFTOX in the last 72 hours. Fecal Lactopherrin No results for input(s): FECLLACTOFRN in the last 72 hours.  Studies/Results: Dg Chest 2 View  05/03/2014   CLINICAL DATA:  Central chest pain with burning sensation for 3 days. 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 History of myocardial infarction and stents.  EXAM: CHEST  2 VIEW  COMPARISON:  Chest radiograph Oct 17, 2012  FINDINGS: The cardiac silhouette is upper limits of normal in size, mediastinal silhouette is nonsuspicious. The lungs are clear without pleural  effusions or focal consolidations. Trachea projects midline and there is no pneumothorax. Soft tissue planes and included osseous structures are non-suspicious.  IMPRESSION: Borderline cardiomegaly, no acute pulmonary process.   Electronically Signed   By: Elon Alas   On: 05/03/2014 05:35   Ct Angio Chest Aorta W/cm &/or Wo/cm  05/03/2014   CLINICAL DATA:  Severe mid to lower chest pain. Concern for dissection.  EXAM: CT ANGIOGRAPHY CHEST, ABDOMEN AND PELVIS  TECHNIQUE: Multidetector CT imaging through the chest, abdomen and pelvis was performed using the standard protocol during bolus administration of intravenous contrast. Multiplanar reconstructed images and MIPs were obtained and reviewed to evaluate the vascular anatomy.  CONTRAST:  124mL OMNIPAQUE IOHEXOL 350 MG/ML SOLN  COMPARISON:  Abdominal CT 03/20/2012  FINDINGS: CTA CHEST FINDINGS  THORACIC INLET/BODY WALL:  Prominent venous collaterals in the left chest, likely from arm positioning.  MEDIASTINUM:  Normal heart size. No pericardial effusion. No visible coronary calcification. No evidence of intramural aortic hematoma no aortic aneurysm or dissection in the chest. No main pulmonary embolism.  Unusual appearance of the left lower lobe pulmonary arteries which appear diminutive. The arteries neighboring the lower lobe bronchi are asymmetrically bright, and likely arise from the enlarged bronchial artery in this location. There is suggestion of these findings also seen on 03/20/2012 abdominal CT.  LUNG WINDOWS:  No consolidation. No effusion. No suspicious  pulmonary nodule. Atelectatic changes in the left lower lobe, possibly ischemic.  OSSEOUS:  No acute fracture.  No suspicious lytic or blastic lesions.  Review of the MIP images confirms the above findings.  CTA ABDOMEN AND PELVIS FINDINGS  BODY WALL: Unremarkable.  Liver: No focal abnormality.  Biliary: No evidence of biliary obstruction or stone.  Pancreas: Pancreatic duct is at the upper  limits of normal. No evidence of mass or pancreatitis, either acute or chronic.  Spleen: Unremarkable.  Adrenals: Unremarkable.  Kidneys and ureters: No hydronephrosis or stone.  Bladder: Unremarkable.  Reproductive: Unremarkable.  Bowel: No obstruction. No right lower quadrant inflammatory changes.  Retroperitoneum: No mass or adenopathy.  Peritoneum: No ascites or pneumoperitoneum.  Vascular: There is a unusual anastomosis between the IMA and the gastro-omental vessels. No vessel occlusion to explain this finding. Otherwise, visceral aortic branching is standard. There is no aortic aneurysm or dissection.  OSSEOUS: Chronic fragmentation of the posterior right ilium. Focal degenerative disc narrowing at L5-S1  Review of the MIP images confirms the above findings.  IMPRESSION: 1. No aortic dissection.  No aortic aneurysm. 2. 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 It is unclear if this is acquired or congenital.   Electronically Signed   By: Jorje Guild M.D.   On: 05/03/2014 07:18   Ct Angio Abd/pel W/ And/or W/o  05/03/2014   CLINICAL DATA:  Severe mid to lower chest pain. Concern for dissection.  EXAM: CT ANGIOGRAPHY CHEST, ABDOMEN AND PELVIS  TECHNIQUE: Multidetector CT imaging through the chest, abdomen and pelvis was performed using the standard protocol during bolus administration of intravenous contrast. Multiplanar reconstructed images and MIPs were obtained and reviewed to evaluate the vascular anatomy.  CONTRAST:  17mL OMNIPAQUE IOHEXOL 350 MG/ML SOLN  COMPARISON:  Abdominal CT 03/20/2012  FINDINGS: CTA CHEST FINDINGS  THORACIC INLET/BODY WALL:  Prominent venous collaterals in the left chest, likely from arm positioning.  MEDIASTINUM:  Normal heart size. No pericardial effusion. No visible coronary calcification. No evidence of intramural aortic hematoma no aortic aneurysm or dissection in the chest. No main pulmonary  embolism.  Unusual appearance of the left lower lobe pulmonary arteries which appear diminutive. The arteries neighboring the lower lobe bronchi are asymmetrically bright, and likely arise from the enlarged bronchial artery in this location. There is suggestion of these findings also seen on 03/20/2012 abdominal CT.  LUNG WINDOWS:  No consolidation. No effusion. No suspicious pulmonary nodule. Atelectatic changes in the left lower lobe, possibly ischemic.  OSSEOUS:  No acute fracture.  No suspicious lytic or blastic lesions.  Review of the MIP images confirms the above findings.  CTA ABDOMEN AND PELVIS FINDINGS  BODY WALL: Unremarkable.  Liver: No focal abnormality.  Biliary: No evidence of biliary obstruction or stone.  Pancreas: Pancreatic duct is at the upper limits of normal. No evidence of mass or pancreatitis, either acute or chronic.  Spleen: Unremarkable.  Adrenals: Unremarkable.  Kidneys and ureters: No hydronephrosis or stone.  Bladder: Unremarkable.  Reproductive: Unremarkable.  Bowel: No obstruction. No right lower quadrant inflammatory changes.  Retroperitoneum: No mass or adenopathy.  Peritoneum: No ascites or pneumoperitoneum.  Vascular: There is a unusual anastomosis between the IMA and the gastro-omental vessels. No vessel occlusion to explain this finding. Otherwise, visceral aortic branching is standard. There is no aortic aneurysm or dissection.  OSSEOUS: Chronic fragmentation of the posterior right ilium. Focal degenerative disc narrowing at L5-S1  Review of  the MIP images confirms the above findings.  IMPRESSION: 1. No aortic dissection.  No aortic aneurysm. 2. 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 It is unclear if this is acquired or congenital.   Electronically Signed   By: Jorje Guild M.D.   On: 05/03/2014 07:18    Medications:  Scheduled: . pantoprazole (PROTONIX) IV  40 mg Intravenous BID  . sodium  chloride  3 mL Intravenous Q12H   Continuous: . sodium chloride 100 mL/hr at 05/03/14 0942    Assessment/Plan: 1) Nausea/Vomiting and Epigastric pain. 2) History of CAD. 3) Canniboid abuse.   Per my discussion with the patient he states that he had similar symptoms in the past and he was treated with an cardiac catherization twice.  He reports that his symptoms are similar to his prior cardiac pain, but his current cardiac work up is negative.  In fact, Dr. Debara Pickett notes that he has nonobstructive CAD.  He has a long history of marijuana abuse since the age of 71.  On average he smokes marijuana 3 times per week.  I am wondering if he has Canniboid Hyperemesis Syndrome or if this plays a role.  Plan: 1) I asked him to take a hot shower to see if there is any improvement. 2) Continue with PPI. 3) If no significant improvement, I think an EGD is not an unreasonable procedure to perform.   LOS: 1 day   Ninoska Goswick D 05/04/2014, 8:34 AM

## 2014-05-04 NOTE — Discharge Summary (Signed)
Name: [name] [name] MRN: [id number] DOB: [birth year] 48 y.o. PCP: [name], MD  Date of Admission: 05/03/2014  3:36 AM Date of Discharge: 05/04/2014 Attending Physician: Annia Belt, MD  Discharge Diagnosis:  Principal Problem:   GERD (gastroesophageal reflux disease) Active Problems:   Bradycardia   Bipolar disorder  Discharge Medications:   Medication List    STOP taking these medications        ibuprofen 200 MG tablet  Commonly known as:  ADVIL,MOTRIN     ranitidine 150 MG tablet  Commonly known as:  ZANTAC      TAKE these medications        pantoprazole 40 MG tablet  Commonly known as:  PROTONIX  Take 1 tablet (40 mg total) by mouth daily.     QUEtiapine 200 MG tablet  Commonly known as:  SEROQUEL  Take 1 tablet (200 mg total) by mouth at bedtime.        Disposition and follow-up:   Mr.Christohper D Sollenberger was discharged from [facility] in Stable condition.  At the hospital follow up visit please address:  1.  GERD: resolution of symptoms on PPI  2.  Labs / imaging needed at time of follow-up: none  3.  Pending labs/ test needing follow-up: none  Follow-up Appointments:     Follow-up Information    Follow up with [facility], [place]. Go in 1 week.   Specialty:  Internal Medicine   Contact information:   [address] [place] [address] [phone]       Discharge Instructions:   Consultations: Treatment Team:  Inda Castle, MD Beryle Beams, MD  Procedures Performed:  Dg Chest 2 View  05/03/2014   CLINICAL DATA:  Central chest pain with burning sensation for 3 days. 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 History of myocardial infarction and stents.  EXAM: CHEST  2 VIEW  COMPARISON:  Chest radiograph Oct 17, 2012  FINDINGS: The cardiac silhouette is upper limits of normal in size, mediastinal silhouette is nonsuspicious. The lungs are clear without pleural effusions or focal consolidations.  Trachea projects midline and there is no pneumothorax. Soft tissue planes and included osseous structures are non-suspicious.  IMPRESSION: Borderline cardiomegaly, no acute pulmonary process.   Electronically Signed   By: Elon Alas   On: 05/03/2014 05:35   Ct Angio Chest Aorta W/cm &/or Wo/cm  05/03/2014   CLINICAL DATA:  Severe mid to lower chest pain. Concern for dissection.  EXAM: CT ANGIOGRAPHY CHEST, ABDOMEN AND PELVIS  TECHNIQUE: Multidetector CT imaging through the chest, abdomen and pelvis was performed using the standard protocol during bolus administration of intravenous contrast. Multiplanar reconstructed images and MIPs were obtained and reviewed to evaluate the vascular anatomy.  CONTRAST:  135mL OMNIPAQUE IOHEXOL 350 MG/ML SOLN  COMPARISON:  Abdominal CT 03/20/2012  FINDINGS: CTA CHEST FINDINGS  THORACIC INLET/BODY WALL:  Prominent venous collaterals in the left chest, likely from arm positioning.  MEDIASTINUM:  Normal heart size. No pericardial effusion. No visible coronary calcification. No evidence of intramural aortic hematoma no aortic aneurysm or dissection in the chest. No main pulmonary embolism.  Unusual appearance of the left lower lobe pulmonary arteries which appear diminutive. The arteries neighboring the lower lobe bronchi are asymmetrically bright, and likely arise from the enlarged bronchial artery in this location. There is suggestion of these findings also seen on 03/20/2012 abdominal CT.  LUNG WINDOWS:  No consolidation. No effusion. No suspicious pulmonary nodule.  Atelectatic changes in the left lower lobe, possibly ischemic.  OSSEOUS:  No acute fracture.  No suspicious lytic or blastic lesions.  Review of the MIP images confirms the above findings.  CTA ABDOMEN AND PELVIS FINDINGS  BODY WALL: Unremarkable.  Liver: No focal abnormality.  Biliary: No evidence of biliary obstruction or stone.  Pancreas: Pancreatic duct is at the upper limits of normal. No evidence of mass  or pancreatitis, either acute or chronic.  Spleen: Unremarkable.  Adrenals: Unremarkable.  Kidneys and ureters: No hydronephrosis or stone.  Bladder: Unremarkable.  Reproductive: Unremarkable.  Bowel: No obstruction. No right lower quadrant inflammatory changes.  Retroperitoneum: No mass or adenopathy.  Peritoneum: No ascites or pneumoperitoneum.  Vascular: There is a unusual anastomosis between the IMA and the gastro-omental vessels. No vessel occlusion to explain this finding. Otherwise, visceral aortic branching is standard. There is no aortic aneurysm or dissection.  OSSEOUS: Chronic fragmentation of the posterior right ilium. Focal degenerative disc narrowing at L5-S1  Review of the MIP images confirms the above findings.  IMPRESSION: 1. No aortic dissection.  No aortic aneurysm. 2. 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 It is unclear if this is acquired or congenital.   Electronically Signed   By: Jorje Guild M.D.   On: 05/03/2014 07:18   Ct Angio Abd/pel W/ And/or W/o  05/03/2014   CLINICAL DATA:  Severe mid to lower chest pain. Concern for dissection.  EXAM: CT ANGIOGRAPHY CHEST, ABDOMEN AND PELVIS  TECHNIQUE: Multidetector CT imaging through the chest, abdomen and pelvis was performed using the standard protocol during bolus administration of intravenous contrast. Multiplanar reconstructed images and MIPs were obtained and reviewed to evaluate the vascular anatomy.  CONTRAST:  152mL OMNIPAQUE IOHEXOL 350 MG/ML SOLN  COMPARISON:  Abdominal CT 03/20/2012  FINDINGS: CTA CHEST FINDINGS  THORACIC INLET/BODY WALL:  Prominent venous collaterals in the left chest, likely from arm positioning.  MEDIASTINUM:  Normal heart size. No pericardial effusion. No visible coronary calcification. No evidence of intramural aortic hematoma no aortic aneurysm or dissection in the chest. No main pulmonary embolism.  Unusual appearance of the  left lower lobe pulmonary arteries which appear diminutive. The arteries neighboring the lower lobe bronchi are asymmetrically bright, and likely arise from the enlarged bronchial artery in this location. There is suggestion of these findings also seen on 03/20/2012 abdominal CT.  LUNG WINDOWS:  No consolidation. No effusion. No suspicious pulmonary nodule. Atelectatic changes in the left lower lobe, possibly ischemic.  OSSEOUS:  No acute fracture.  No suspicious lytic or blastic lesions.  Review of the MIP images confirms the above findings.  CTA ABDOMEN AND PELVIS FINDINGS  BODY WALL: Unremarkable.  Liver: No focal abnormality.  Biliary: No evidence of biliary obstruction or stone.  Pancreas: Pancreatic duct is at the upper limits of normal. No evidence of mass or pancreatitis, either acute or chronic.  Spleen: Unremarkable.  Adrenals: Unremarkable.  Kidneys and ureters: No hydronephrosis or stone.  Bladder: Unremarkable.  Reproductive: Unremarkable.  Bowel: No obstruction. No right lower quadrant inflammatory changes.  Retroperitoneum: No mass or adenopathy.  Peritoneum: No ascites or pneumoperitoneum.  Vascular: There is a unusual anastomosis between the IMA and the gastro-omental vessels. No vessel occlusion to explain this finding. Otherwise, visceral aortic branching is standard. There is no aortic aneurysm or dissection.  OSSEOUS: Chronic fragmentation of the posterior right ilium. Focal degenerative disc narrowing at L5-S1  Review of the MIP  images confirms the above findings.  IMPRESSION: 1. No aortic dissection.  No aortic aneurysm. 2. 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 It is unclear if this is acquired or congenital.   Electronically Signed   By: Jorje Guild M.D.   On: 05/03/2014 07:18    Admission HPI: Mr. Montesinos is a 48 year old male with history of non-obstructive CAD, bradycardia, polysubstance abuse (alcohol,  THC, opiates), bipolar disorder who presents with chest pain.   Three days ago, he reported the onset of two separate pains - a sharp, mid-chest pain and a burning, epigastric pain - while he was laying down. He took 800mg  of some pill (Advil?) that he can't remember which made the pain worse altogether. He reports that he hasn't required any acid reflux medicine or NSAIDs over the past year and that he was told he had an ulcer during his most recent cardiac cath. This pain he feels is worse with hot and spicy foods but felt it improved with Protonix that he got once in the ED. He reports drinking beers occasionally as well as some associated dizziness and nausea (last meal was some [place] last night) but denies any dark stools, blood in his stool, vomiting, sick contacts, fever, history of gallbladder/pancreas disease, weight loss.  Per chart review, cardiac catheterization in 2009 was unremarkable for obstructive CAD but was repeated in 2013 and showed mild atherosclerotic changes of the middle LAD; EF was 55% at that time as well. He has multiple ED visits in the past for chest pain. Abdominal US in September 2013 showed splenic hyperechoicity but no hepatobiliary findings.  In the ED, he was given ASA, nitroglycerin, Fentanyl which mildly improved his chest pain, and Cardiology was consulted. His burning pain worsened after the GI cocktail.   Hospital Course by problem list:   Chest pain: Likely 2/2 GERD with possible acute gastritis. Troponins unremarkable x 3, telemetry unremarkable overnight, and EKG findings were reassuring to r/o ACS. GI was consulted as his Hb was 12, down from baseline 13-15, for possible upper endoscopy to rule out peptic ulcer disease, and recommended PPI therapy with upper endoscopy should symptoms persist or worsen. Hb was stable on repeat CBCs, and his pain improved with Protonix 40mg . He was tolerating PO intake at the time of discharge and was prescribed this medication.  He was also counseled on avoiding NSAIDs and asked to follow-up with his PCP should his symptoms worsen for further workup.   Bradycardia: HR trended 40s-50s overnight on telemetry, but he was otherwise asymptomatic.  Bipolar disoder: Per Pharmacy, he never picked up Seroquel and has no symptoms otherwise to indicate worsening of this condition.   Discharge Vitals:   BP 109/68 mmHg  Pulse 49  Temp(Src) 98 F (36.7 C) (Oral)  Resp 16  Ht 5\' 4"  (1.626 m)  Wt 123 lb (55.792 kg)  BMI 21.10 kg/m2  SpO2 100%  Discharge Labs:  Results for orders placed or performed during the hospital encounter of 05/03/14 (from the past 24 hour(s))  Hepatic function panel     Status: Abnormal   Collection Time: 05/03/14 11:25 AM  Result Value Ref Range   Total Protein 5.9 (L) 6.0 - 8.3 g/dL   Albumin 3.1 (L) 3.5 - 5.2 g/dL   AST 21 0 - 37 U/L   ALT 13 0 - 53 U/L   Alkaline Phosphatase 64 39 - 117 U/L   Total Bilirubin 0.3  0.3 - 1.2 mg/dL   Bilirubin, Direct <0.2 0.0 - 0.3 mg/dL   Indirect Bilirubin NOT CALCULATED 0.3 - 0.9 mg/dL  Protime-INR     Status: None   Collection Time: 05/03/14 11:25 AM  Result Value Ref Range   Prothrombin Time 14.5 11.6 - 15.2 seconds   INR 1.12 0.00 - 1.49  CBC with Differential     Status: Abnormal   Collection Time: 05/03/14 11:25 AM  Result Value Ref Range   WBC 3.8 (L) 4.0 - 10.5 K/uL   RBC 3.80 (L) 4.22 - 5.81 MIL/uL   Hemoglobin 11.6 (L) 13.0 - 17.0 g/dL   HCT 33.8 (L) 39.0 - 52.0 %   MCV 88.9 78.0 - 100.0 fL   MCH 30.5 26.0 - 34.0 pg   MCHC 34.3 30.0 - 36.0 g/dL   RDW 12.8 11.5 - 15.5 %   Platelets 170 150 - 400 K/uL   Neutrophils Relative % 50 43 - 77 %   Neutro Abs 1.9 1.7 - 7.7 K/uL   Lymphocytes Relative 39 12 - 46 %   Lymphs Abs 1.5 0.7 - 4.0 K/uL   Monocytes Relative 9 3 - 12 %   Monocytes Absolute 0.3 0.1 - 1.0 K/uL   Eosinophils Relative 2 0 - 5 %   Eosinophils Absolute 0.1 0.0 - 0.7 K/uL   Basophils Relative 0 0 - 1 %   Basophils Absolute  0.0 0.0 - 0.1 K/uL  Troponin I     Status: None   Collection Time: 05/03/14  3:37 PM  Result Value Ref Range   Troponin I <0.30 <0.30 ng/mL  Basic metabolic panel     Status: Abnormal   Collection Time: 05/04/14  3:30 AM  Result Value Ref Range   Sodium 140 137 - 147 mEq/L   Potassium 4.3 3.7 - 5.3 mEq/L   Chloride 107 96 - 112 mEq/L   CO2 23 19 - 32 mEq/L   Glucose, Bld 85 70 - 99 mg/dL   BUN 9 6 - 23 mg/dL   Creatinine, Ser 1.14 0.50 - 1.35 mg/dL   Calcium 7.9 (L) 8.4 - 10.5 mg/dL   GFR calc non Af Amer 74 (L) >90 mL/min   GFR calc Af Amer 86 (L) >90 mL/min   Anion gap 10 5 - 15  CBC with Differential     Status: Abnormal   Collection Time: 05/04/14  3:30 AM  Result Value Ref Range   WBC 3.9 (L) 4.0 - 10.5 K/uL   RBC 3.88 (L) 4.22 - 5.81 MIL/uL   Hemoglobin 11.7 (L) 13.0 - 17.0 g/dL   HCT 34.8 (L) 39.0 - 52.0 %   MCV 89.7 78.0 - 100.0 fL   MCH 30.2 26.0 - 34.0 pg   MCHC 33.6 30.0 - 36.0 g/dL   RDW 12.9 11.5 - 15.5 %   Platelets 162 150 - 400 K/uL   Neutrophils Relative % 43 43 - 77 %   Neutro Abs 1.7 1.7 - 7.7 K/uL   Lymphocytes Relative 45 12 - 46 %   Lymphs Abs 1.7 0.7 - 4.0 K/uL   Monocytes Relative 9 3 - 12 %   Monocytes Absolute 0.4 0.1 - 1.0 K/uL   Eosinophils Relative 3 0 - 5 %   Eosinophils Absolute 0.1 0.0 - 0.7 K/uL   Basophils Relative 0 0 - 1 %   Basophils Absolute 0.0 0.0 - 0.1 K/uL    Signed: Charlott Rakes, MD 05/07/2014, 11:55 AM  Services Ordered on Discharge: None Equipment Ordered on Discharge: None

## 2014-05-04 NOTE — Discharge Instructions (Signed)
Thank you for trusting [place] with your medical care!  You were hospitalized for gastric reflux and treated with Protonix.   Please continue taking this medication and follow-up with your regular doctor. If your symptoms do not improve over the next 4-6 weeks, then you may need to see a GI doctor.  Do NOT take aspirin or ibuprofen.  Gastroesophageal Reflux Disease, Adult Gastroesophageal reflux disease (GERD) happens when acid from your stomach goes into your food pipe (esophagus). The acid can cause a burning feeling in your chest. Over time, the acid can make small holes (ulcers) in your food pipe.  HOME CARE  Ask your doctor for advice about:  Losing weight.  Quitting smoking.  Alcohol use.  Avoid foods and drinks that make your problems worse. You may want to avoid:  Caffeine and alcohol.  Chocolate.  Mints.  Garlic and onions.  Spicy foods.  Citrus fruits, such as oranges, lemons, or limes.  Foods that contain tomato, such as sauce, chili, salsa, and pizza.  Fried and fatty foods.  Avoid lying down for 3 hours before you go to bed or before you take a nap.  Eat small meals often, instead of large meals.  Wear loose-fitting clothing. Do not wear anything tight around your waist.  Raise (elevate) the head of your bed 6 to 8 inches with wood blocks. Using extra pillows does not help.  Only take medicines as told by your doctor.  Do not take aspirin or ibuprofen. GET HELP RIGHT AWAY IF:   You have pain in your arms, neck, jaw, teeth, or back.  Your pain gets worse or changes.  You feel sick to your stomach (nauseous), throw up (vomit), or sweat (diaphoresis).  You feel short of breath, or you pass out (faint).  Your throw up is green, yellow, black, or looks like coffee grounds or blood.  Your poop (stool) is red, bloody, or black. MAKE SURE YOU:   Understand these instructions.  Will watch your condition.  Will get help right away if you are not doing  well or get worse. Document Released: 11/10/2007 Document Revised: 08/16/2011 Document Reviewed: 12/11/2010 [facility] Patient Information 2015 [place], [place]. This information is not intended to replace advice given to you by your health care provider. Make sure you discuss any questions you have with your health care provider.

## 2014-05-04 NOTE — Progress Notes (Signed)
Subjective: This AM, he reports improvement in his pain though did have some nausea this AM which did not improve after taking a hot shower. He acknowledges that he may have taken Advil which might have worsened his overall condition and resolves to take daily Protonix. We recommended follow-up with his PCP within a week as well.   Objective: Vital signs in last 24 hours: Filed Vitals:   05/03/14 0717 05/03/14 1405 05/03/14 1959 05/04/14 0522  BP: 109/63 98/54 105/67 109/68  Pulse: 41 44 44 49  Temp: 97.8 F (36.6 C) 98.2 F (36.8 C) 98.2 F (36.8 C) 98 F (36.7 C)  TempSrc: Oral Oral Oral Oral  Resp: 14 16    Height:      Weight:    123 lb (55.792 kg)  SpO2: 100% 100% 100% 100%   Weight change: -2 lb (-0.907 kg)  Intake/Output Summary (Last 24 hours) at 05/04/14 1024 Last data filed at 05/04/14 0800  Gross per 24 hour  Intake    920 ml  Output    650 ml  Net    270 ml   General: resting in bed, NAD HEENT: PERRL, EOMI, no scleral icterus, oropharynx clear Cardiac: RRR, no rubs, murmurs or gallops Pulm: clear to auscultation bilaterally, no wheezes, rales, or rhonchi Abd: soft, nontender, nondistended, BS present Ext: warm and well perfused, no pedal edema Neuro: responds to questions appropriately; moving all extremities freely   Lab Results: Basic Metabolic Panel:  Recent Labs Lab 05/03/14 0427 05/04/14 0330  NA 139 140  K 4.4 4.3  CL 101 107  CO2 27 23  GLUCOSE 102* 85  BUN 17 9  CREATININE 1.21 1.14  CALCIUM 9.0 7.9*   Liver Function Tests:  Recent Labs Lab 05/03/14 1125  AST 21  ALT 13  ALKPHOS 64  BILITOT 0.3  PROT 5.9*  ALBUMIN 3.1*    Recent Labs Lab 05/03/14 0950  LIPASE 39   CBC:  Recent Labs Lab 05/03/14 1125 05/04/14 0330  WBC 3.8* 3.9*  NEUTROABS 1.9 1.7  HGB 11.6* 11.7*  HCT 33.8* 34.8*  MCV 88.9 89.7  PLT 170 162   Cardiac Enzymes:  Recent Labs Lab 05/03/14 0427 05/03/14 0950 05/03/14 1537  TROPONINI <0.30  <0.30 <0.30   BNP:  Recent Labs Lab 05/03/14 0419  PROBNP 40.2   Hemoglobin A1C:  Recent Labs Lab 05/03/14 0950  HGBA1C 4.5   Fasting Lipid Panel:  Recent Labs Lab 05/03/14 0726  CHOL 159  HDL 98  LDLCALC 42  TRIG 97  CHOLHDL 1.6   Coagulation:  Recent Labs Lab 05/03/14 1125  LABPROT 14.5  INR 1.12   Urine Drug Screen: Drugs of Abuse     Component Value Date/Time   LABOPIA NONE DETECTED 05/03/2014 0729   COCAINSCRNUR NONE DETECTED 05/03/2014 0729   LABBENZ NONE DETECTED 05/03/2014 0729   AMPHETMU NONE DETECTED 05/03/2014 0729   THCU POSITIVE* 05/03/2014 0729   LABBARB NONE DETECTED 05/03/2014 0729    Alcohol Level:  Recent Labs Lab 05/03/14 0726  ETH <11   Studies/Results: Dg Chest 2 View  05/03/2014   CLINICAL DATA:  Central chest pain with burning sensation for 3 days. 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 History of myocardial infarction and stents.  EXAM: CHEST  2 VIEW  COMPARISON:  Chest radiograph Oct 17, 2012  FINDINGS: The cardiac silhouette is upper limits of normal in size, mediastinal silhouette is nonsuspicious. The lungs are clear without pleural effusions or focal consolidations.  Trachea projects midline and there is no pneumothorax. Soft tissue planes and included osseous structures are non-suspicious.  IMPRESSION: Borderline cardiomegaly, no acute pulmonary process.   Electronically Signed   By: Elon Alas   On: 05/03/2014 05:35   Ct Angio Chest Aorta W/cm &/or Wo/cm  05/03/2014   CLINICAL DATA:  Severe mid to lower chest pain. Concern for dissection.  EXAM: CT ANGIOGRAPHY CHEST, ABDOMEN AND PELVIS  TECHNIQUE: Multidetector CT imaging through the chest, abdomen and pelvis was performed using the standard protocol during bolus administration of intravenous contrast. Multiplanar reconstructed images and MIPs were obtained and reviewed to evaluate the vascular anatomy.  CONTRAST:  181mL OMNIPAQUE IOHEXOL 350 MG/ML SOLN   COMPARISON:  Abdominal CT 03/20/2012  FINDINGS: CTA CHEST FINDINGS  THORACIC INLET/BODY WALL:  Prominent venous collaterals in the left chest, likely from arm positioning.  MEDIASTINUM:  Normal heart size. No pericardial effusion. No visible coronary calcification. No evidence of intramural aortic hematoma no aortic aneurysm or dissection in the chest. No main pulmonary embolism.  Unusual appearance of the left lower lobe pulmonary arteries which appear diminutive. The arteries neighboring the lower lobe bronchi are asymmetrically bright, and likely arise from the enlarged bronchial artery in this location. There is suggestion of these findings also seen on 03/20/2012 abdominal CT.  LUNG WINDOWS:  No consolidation. No effusion. No suspicious pulmonary nodule. Atelectatic changes in the left lower lobe, possibly ischemic.  OSSEOUS:  No acute fracture.  No suspicious lytic or blastic lesions.  Review of the MIP images confirms the above findings.  CTA ABDOMEN AND PELVIS FINDINGS  BODY WALL: Unremarkable.  Liver: No focal abnormality.  Biliary: No evidence of biliary obstruction or stone.  Pancreas: Pancreatic duct is at the upper limits of normal. No evidence of mass or pancreatitis, either acute or chronic.  Spleen: Unremarkable.  Adrenals: Unremarkable.  Kidneys and ureters: No hydronephrosis or stone.  Bladder: Unremarkable.  Reproductive: Unremarkable.  Bowel: No obstruction. No right lower quadrant inflammatory changes.  Retroperitoneum: No mass or adenopathy.  Peritoneum: No ascites or pneumoperitoneum.  Vascular: There is a unusual anastomosis between the IMA and the gastro-omental vessels. No vessel occlusion to explain this finding. Otherwise, visceral aortic branching is standard. There is no aortic aneurysm or dissection.  OSSEOUS: Chronic fragmentation of the posterior right ilium. Focal degenerative disc narrowing at L5-S1  Review of the MIP images confirms the above findings.  IMPRESSION: 1. No aortic  dissection.  No aortic aneurysm. 2. 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 It is unclear if this is acquired or congenital.   Electronically Signed   By: Jorje Guild M.D.   On: 05/03/2014 07:18   Ct Angio Abd/pel W/ And/or W/o  05/03/2014   CLINICAL DATA:  Severe mid to lower chest pain. Concern for dissection.  EXAM: CT ANGIOGRAPHY CHEST, ABDOMEN AND PELVIS  TECHNIQUE: Multidetector CT imaging through the chest, abdomen and pelvis was performed using the standard protocol during bolus administration of intravenous contrast. Multiplanar reconstructed images and MIPs were obtained and reviewed to evaluate the vascular anatomy.  CONTRAST:  142mL OMNIPAQUE IOHEXOL 350 MG/ML SOLN  COMPARISON:  Abdominal CT 03/20/2012  FINDINGS: CTA CHEST FINDINGS  THORACIC INLET/BODY WALL:  Prominent venous collaterals in the left chest, likely from arm positioning.  MEDIASTINUM:  Normal heart size. No pericardial effusion. No visible coronary calcification. No evidence of intramural aortic hematoma no aortic aneurysm or dissection in the chest.  No main pulmonary embolism.  Unusual appearance of the left lower lobe pulmonary arteries which appear diminutive. The arteries neighboring the lower lobe bronchi are asymmetrically bright, and likely arise from the enlarged bronchial artery in this location. There is suggestion of these findings also seen on 03/20/2012 abdominal CT.  LUNG WINDOWS:  No consolidation. No effusion. No suspicious pulmonary nodule. Atelectatic changes in the left lower lobe, possibly ischemic.  OSSEOUS:  No acute fracture.  No suspicious lytic or blastic lesions.  Review of the MIP images confirms the above findings.  CTA ABDOMEN AND PELVIS FINDINGS  BODY WALL: Unremarkable.  Liver: No focal abnormality.  Biliary: No evidence of biliary obstruction or stone.  Pancreas: Pancreatic duct is at the upper limits of normal. No evidence  of mass or pancreatitis, either acute or chronic.  Spleen: Unremarkable.  Adrenals: Unremarkable.  Kidneys and ureters: No hydronephrosis or stone.  Bladder: Unremarkable.  Reproductive: Unremarkable.  Bowel: No obstruction. No right lower quadrant inflammatory changes.  Retroperitoneum: No mass or adenopathy.  Peritoneum: No ascites or pneumoperitoneum.  Vascular: There is a unusual anastomosis between the IMA and the gastro-omental vessels. No vessel occlusion to explain this finding. Otherwise, visceral aortic branching is standard. There is no aortic aneurysm or dissection.  OSSEOUS: Chronic fragmentation of the posterior right ilium. Focal degenerative disc narrowing at L5-S1  Review of the MIP images confirms the above findings.  IMPRESSION: 1. No aortic dissection.  No aortic aneurysm. 2. 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 It is unclear if this is acquired or congenital.   Electronically Signed   By: Jorje Guild M.D.   On: 05/03/2014 07:18   Medications: I have reviewed the patient's current medications. Scheduled Meds: . pantoprazole (PROTONIX) IV  40 mg Intravenous BID  . sodium chloride  3 mL Intravenous Q12H   Continuous Infusions: . sodium chloride 100 mL/hr at 05/03/14 0942   PRN Meds:.morphine injection Assessment/Plan: Active Problems:   Chest pain   Abdominal pain, epigastric  Mr. Skillin is a 48 year old male with history of non-obstructive CAD, bradycardia, polysubstance abuse (alcohol, THC, opiates) hospitalized for GERD.  GERD: Symptoms presented in the setting of NSAID use and has had multiple similar episodes in the past all with unremarkable cardiac workups. Hb stable at 11 so low concern for active bleeding. GI recommended conservative management with PPI and upper endoscopy should symptoms persist. -Discharge him on Protonix 40mg  QD and follow-up with PCP if symptoms persist for  referral to GI  Bradycardia: HR trending 40s-50s overnight on telemetry. Per him, it's his baseline and is without symptoms.  Polysubstance abuse: Concern for cannabinoid hyperemesis syndrome given his marijuana use though he reports no improvement with warm showers.   Bipolar disoder: Per pharmacy, he never picked up Seroquel and has no symptoms otherwise to indicate worsening of this condition.  #FEN:  -Diet: Regular  #DVT prophylaxis: SCDs  #CODE STATUS: FULL CODE -Defer to his daughters Montoya Brandel, [place], [place]) if he is unable to make decision for himself -Confirmed with him at bedside  Dispo: Disposition is deferred at this time, awaiting improvement of current medical problems.    The patient does have a current PCP (Sami Sheryle Hail, MD) and does not need an [facility] hospital follow-up appointment after discharge.  The patient does not know have transportation limitations that hinder transportation to clinic appointments.  .Services Needed at time of discharge: Y = Yes, Blank =  No PT:   OT:   RN:   Equipment:   Other:     LOS: 1 day   Charlott Rakes, MD 05/04/2014, 10:44 AM

## 2014-05-04 NOTE — Progress Notes (Signed)
UR completed 

## 2014-05-07 DIAGNOSIS — F319 Bipolar disorder, unspecified: Secondary | ICD-10-CM | POA: Diagnosis present

## 2014-05-16 ENCOUNTER — Encounter (HOSPITAL_COMMUNITY): Payer: Self-pay | Admitting: Interventional Cardiology

## 2014-05-19 ENCOUNTER — Encounter (HOSPITAL_COMMUNITY): Payer: Self-pay | Admitting: Emergency Medicine

## 2014-05-19 ENCOUNTER — Emergency Department (HOSPITAL_COMMUNITY): Payer: Medicaid Other

## 2014-05-19 ENCOUNTER — Emergency Department (HOSPITAL_COMMUNITY)
Admission: EM | Admit: 2014-05-19 | Discharge: 2014-05-19 | Disposition: A | Discharge status: Home / Self Care | Payer: Medicaid Other | Attending: Emergency Medicine | Admitting: Emergency Medicine

## 2014-05-19 ENCOUNTER — Other Ambulatory Visit: Payer: Self-pay

## 2014-05-19 DIAGNOSIS — R079 Chest pain, unspecified: Secondary | ICD-10-CM | POA: Diagnosis not present

## 2014-05-19 DIAGNOSIS — F319 Bipolar disorder, unspecified: Secondary | ICD-10-CM | POA: Insufficient documentation

## 2014-05-19 DIAGNOSIS — Z9889 Other specified postprocedural states: Secondary | ICD-10-CM | POA: Insufficient documentation

## 2014-05-19 DIAGNOSIS — K219 Gastro-esophageal reflux disease without esophagitis: Secondary | ICD-10-CM | POA: Insufficient documentation

## 2014-05-19 DIAGNOSIS — Z79899 Other long term (current) drug therapy: Secondary | ICD-10-CM | POA: Insufficient documentation

## 2014-05-19 DIAGNOSIS — Z8701 Personal history of pneumonia (recurrent): Secondary | ICD-10-CM | POA: Insufficient documentation

## 2014-05-19 DIAGNOSIS — Z720 Tobacco use: Secondary | ICD-10-CM | POA: Diagnosis not present

## 2014-05-19 DIAGNOSIS — Z8739 Personal history of other diseases of the musculoskeletal system and connective tissue: Secondary | ICD-10-CM | POA: Diagnosis not present

## 2014-05-19 DIAGNOSIS — I2510 Atherosclerotic heart disease of native coronary artery without angina pectoris: Secondary | ICD-10-CM | POA: Diagnosis not present

## 2014-05-19 DIAGNOSIS — R0781 Pleurodynia: Secondary | ICD-10-CM

## 2014-05-19 LAB — CBC
HEMATOCRIT: 40.9 % (ref 39.0–52.0)
HEMOGLOBIN: 14 g/dL (ref 13.0–17.0)
MCH: 30.6 pg (ref 26.0–34.0)
MCHC: 34.2 g/dL (ref 30.0–36.0)
MCV: 89.3 fL (ref 78.0–100.0)
Platelets: 201 10*3/uL (ref 150–400)
RBC: 4.58 MIL/uL (ref 4.22–5.81)
RDW: 13 % (ref 11.5–15.5)
WBC: 4 10*3/uL (ref 4.0–10.5)

## 2014-05-19 LAB — BASIC METABOLIC PANEL
ANION GAP: 15 (ref 5–15)
BUN: 12 mg/dL (ref 6–23)
CHLORIDE: 102 meq/L (ref 96–112)
CO2: 23 meq/L (ref 19–32)
Calcium: 8.8 mg/dL (ref 8.4–10.5)
Creatinine, Ser: 1.01 mg/dL (ref 0.50–1.35)
GFR calc Af Amer: >90 mL/min (ref >90)
GFR calc non Af Amer: 85 mL/min — ABNORMAL LOW (ref >90)
Glucose, Bld: 77 mg/dL (ref 70–99)
POTASSIUM: 4.1 meq/L (ref 3.7–5.3)
Sodium: 140 mEq/L (ref 137–147)

## 2014-05-19 LAB — I-STAT TROPONIN, ED: TROPONIN I, POC: 0 ng/mL (ref 0.00–0.08)

## 2014-05-19 LAB — PRO B NATRIURETIC PEPTIDE: Pro B Natriuretic peptide (BNP): 12.8 pg/mL (ref 0–125)

## 2014-05-19 LAB — D-DIMER, QUANTITATIVE (NOT AT ARMC): D DIMER QUANT: 0.45 ug{FEU}/mL (ref 0.00–0.48)

## 2014-05-19 MED ORDER — TRAMADOL HCL 50 MG PO TABS: 50.0000 mg | ORAL_TABLET | Freq: Four times a day (QID) | ORAL | Status: DC | PRN

## 2014-05-19 NOTE — ED Notes (Signed)
Dr. Glick is at the bedside. 

## 2014-05-19 NOTE — ED Notes (Signed)
Pt. reports intermittent mid chest pain with mild SOB , nausea and diaphoresis onset 3 days ago .

## 2014-05-19 NOTE — Discharge Instructions (Signed)
Pleurisy Pleurisy is an inflammation and swelling of the lining of the lungs (pleura). Because of this inflammation, it hurts to breathe. It can be aggravated by coughing, laughing, or deep breathing. Pleurisy is often caused by an underlying infection or disease.  HOME CARE INSTRUCTIONS  Monitor your pleurisy for any changes. The following actions may help to alleviate any discomfort you are experiencing:  Medicine may help with pain. Only take over-the-counter or prescription medicines for pain, discomfort, or fever as directed by your health care provider.  Only take antibiotic medicine as directed. Make sure to finish it even if you start to feel better. SEEK MEDICAL CARE IF:   Your pain is not controlled with medicine or is increasing.  You have an increase in pus-like (purulent) secretions brought up with coughing. SEEK IMMEDIATE MEDICAL CARE IF:   You have blue or dark lips, fingernails, or toenails.  You are coughing up blood.  You have increased difficulty breathing.  You have continuing pain unrelieved by medicine or pain lasting more than 1 week.  You have pain that radiates into your neck, arms, or jaw.  You develop increased shortness of breath or wheezing.  You develop a fever, rash, vomiting, fainting, or other serious symptoms. MAKE SURE YOU:  Understand these instructions.   Will watch your condition.   Will get help right away if you are not doing well or get worse.  Document Released: 05/24/2005 Document Revised: 01/24/2013 Document Reviewed: 11/05/2012 [facility] Patient Information 2015 [place], [place]. This information is not intended to replace advice given to you by your health care provider. Make sure you discuss any questions you have with your health care provider.  Tramadol tablets What is this medicine? TRAMADOL (TRA ma dole) is a pain reliever. It is used to treat moderate to severe pain in adults. This medicine may be used for other purposes;  ask your health care provider or pharmacist if you have questions. COMMON BRAND NAME(S): Ultram What should I tell my health care provider before I take this medicine? They need to know if you have any of these conditions: -brain tumor -depression -drug abuse or addiction -head injury -if you frequently drink alcohol containing drinks -kidney disease or trouble passing urine -liver disease -lung disease, asthma, or breathing problems -seizures or epilepsy -suicidal thoughts, plans, or attempt; a previous suicide attempt by you or a family member -an unusual or allergic reaction to tramadol, codeine, other medicines, foods, dyes, or preservatives -pregnant or trying to get pregnant -breast-feeding How should I use this medicine? Take this medicine by mouth with a full glass of water. Follow the directions on the prescription label. If the medicine upsets your stomach, take it with food or milk. Do not take more medicine than you are told to take. Talk to your pediatrician regarding the use of this medicine in children. Special care may be needed. Overdosage: If you think you have taken too much of this medicine contact a poison control center or emergency room at once. NOTE: This medicine is only for you. Do not share this medicine with others. What if I miss a dose? If you miss a dose, take it as soon as you can. If it is almost time for your next dose, take only that dose. Do not take double or extra doses. What may interact with this medicine? Do not take this medicine with any of the following medications: -MAOIs like Carbex, Eldepryl, Marplan, Nardil, and Parnate This medicine may also interact with the  following medications: -alcohol or medicines that contain alcohol -antihistamines -benzodiazepines -bupropion -carbamazepine or oxcarbazepine -clozapine -cyclobenzaprine -digoxin -furazolidone -linezolid -medicines for depression, anxiety, or psychotic  disturbances -medicines for migraine headache like almotriptan, eletriptan, frovatriptan, naratriptan, rizatriptan, sumatriptan, zolmitriptan -medicines for pain like pentazocine, buprenorphine, butorphanol, meperidine, nalbuphine, and propoxyphene -medicines for sleep -muscle relaxants -naltrexone -phenobarbital -phenothiazines like perphenazine, thioridazine, chlorpromazine, mesoridazine, fluphenazine, prochlorperazine, promazine, and trifluoperazine -procarbazine -warfarin This list may not describe all possible interactions. Give your health care provider a list of all the medicines, herbs, non-prescription drugs, or dietary supplements you use. Also tell them if you smoke, drink alcohol, or use illegal drugs. Some items may interact with your medicine. What should I watch for while using this medicine? Tell your doctor or health care professional if your pain does not go away, if it gets worse, or if you have new or a different type of pain. You may develop tolerance to the medicine. Tolerance means that you will need a higher dose of the medicine for pain relief. Tolerance is normal and is expected if you take this medicine for a long time. Do not suddenly stop taking your medicine because you may develop a severe reaction. Your body becomes used to the medicine. This does NOT mean you are addicted. Addiction is a behavior related to getting and using a drug for a non-medical reason. If you have pain, you have a medical reason to take pain medicine. Your doctor will tell you how much medicine to take. If your doctor wants you to stop the medicine, the dose will be slowly lowered over time to avoid any side effects. You may get drowsy or dizzy. Do not drive, use machinery, or do anything that needs mental alertness until you know how this medicine affects you. Do not stand or sit up quickly, especially if you are an older patient. This reduces the risk of dizzy or fainting spells. Alcohol can  increase or decrease the effects of this medicine. Avoid alcoholic drinks. You may have constipation. Try to have a bowel movement at least every 2 to 3 days. If you do not have a bowel movement for 3 days, call your doctor or health care professional. Your mouth may get dry. Chewing sugarless gum or sucking hard candy, and drinking plenty of water may help. Contact your doctor if the problem does not go away or is severe. What side effects may I notice from receiving this medicine? Side effects that you should report to your doctor or health care professional as soon as possible: -allergic reactions like skin rash, itching or hives, swelling of the face, lips, or tongue -breathing difficulties, wheezing -confusion -itching -light headedness or fainting spells -redness, blistering, peeling or loosening of the skin, including inside the mouth -seizures Side effects that usually do not require medical attention (report to your doctor or health care professional if they continue or are bothersome): -constipation -dizziness -drowsiness -headache -nausea, vomiting This list may not describe all possible side effects. Call your doctor for medical advice about side effects. You may report side effects to FDA at 1-800-FDA-1088. Where should I keep my medicine? Keep out of the reach of children. Store at room temperature between 15 and 30 degrees C (59 and 86 degrees F). Keep container tightly closed. Throw away any unused medicine after the expiration date. NOTE: This sheet is a summary. It may not cover all possible information. If you have questions about this medicine, talk to your doctor, pharmacist, or health care provider.  2015, [employer] Standard. (2010-02-04 11:55:44)

## 2014-05-19 NOTE — ED Notes (Signed)
MD at bedside. 

## 2014-05-19 NOTE — ED Provider Notes (Signed)
CSN: [id number]     Arrival date & time 05/19/14  0149 History   First MD Initiated Contact with Patient 05/19/14 [phone]     Chief Complaint  Patient presents with  . Chest Pain     (Consider location/radiation/quality/duration/timing/severity/associated sxs/prior Treatment) Patient is a 48 y.o. male presenting with chest pain. The history is provided by the patient.  Chest Pain He has been complaining of sharp anterior chest pain for the last 3 days. Pain is intermittent. It will last about 2 minutes before resolving. When present, it is rated at 8/10. It is worse with deep breathing but not affected by exertion or body position or movement. There is no associated dyspnea or nausea. On one occasion, he had slight diaphoresis. He denies cough. There's been no fever, chills, sweats. He was recently hospitalized for evaluation of chest pain but he states that this pain is different. He is not done anything to try to treat this pain. Prior pain was diagnosed as gastroesophageal reflux and he was placed on a proton pump inhibitor. He has not had recurrence of that pain. He does have history of tobacco abuse but he denies history of hypertension, diabetes, hyperlipidemia.  Past Medical History  Diagnosis Date  . Chest pain, mid sternal   . CAD in native artery     a. Nonobstructive cath 11/2007;  b. Presented with ST elevation - Nonobstructive cath 08/2011  . GERD (gastroesophageal reflux disease)   . Tobacco abuse   . Marijuana abuse     a. uses ~ 1x /wk or less  . History of cocaine abuse     a. quit ? 2009  . History of ETOH abuse     a. drinks 2 "40's" / wk  . Bradycardia     a. asymptomatic  . Bipolar disorder   . Syncope     a. 12/2010 - presumed to be vasovagal  . Abnormal ECG     a. early repolarization  . Coronary artery disease   . Pneumonia   . Stomach ulcer   . Arthritis     "my whole left side" (05/03/2014)   Past Surgical History  Procedure Laterality Date  . Cardiac  catheterization  2009; 08/2011    [name] 08/06/2011  . Left heart catheterization with coronary angiogram N/A 08/16/2011    Procedure: LEFT HEART CATHETERIZATION WITH CORONARY ANGIOGRAM;  Surgeon: [name], MD;  Location: [facility] CATH LAB;  Service: Cardiovascular;  Laterality: N/A;   No family history on file. History  Substance Use Topics  . Smoking status: Current Every Day Smoker -- 0.50 packs/day for 20 years    Types: Cigarettes  . Smokeless tobacco: Never Used  . Alcohol Use: 28.8 oz/week    48 Cans of beer per week     Comment: 05/03/2014 "2 40oz beers/day"    Review of Systems  Cardiovascular: Positive for chest pain.  All other systems reviewed and are negative.     Allergies  Aspirin; Pepperoni; Tomato; and Tylenol  Home Medications   Prior to Admission medications   Medication Sig Start Date End Date Taking? Authorizing Provider  pantoprazole (PROTONIX) 40 MG tablet Take 1 tablet (40 mg total) by mouth daily. 05/04/14  Yes Charlott Rakes, MD  QUEtiapine (SEROQUEL) 200 MG tablet Take 1 tablet (200 mg total) by mouth at bedtime. Patient not taking: Reported on 05/03/2014 04/23/14   Shuvon Rankin, NP   BP 132/68 mmHg  Pulse 74  Temp(Src) 97.9 F (36.6 C) (Oral)  Resp 14  SpO2 98% Physical Exam  Nursing note and vitals reviewed.  48 year old male, resting comfortably and in no acute distress. Vital signs are normal. Oxygen saturation is 98%, which is normal. Head is normocephalic and atraumatic. PERRLA, EOMI. Oropharynx is clear. Neck is nontender and supple without adenopathy or JVD. Back is nontender and there is no CVA tenderness. Lungs are clear without rales, wheezes, or rhonchi. Chest is nontender. Heart has regular rate and rhythm without murmur. Abdomen is soft, flat, nontender without masses or hepatosplenomegaly and peristalsis is normoactive. Extremities have no cyanosis or edema, full range of motion is present. Skin is warm and dry without  rash. Neurologic: Mental status is normal, cranial nerves are intact, there are no motor or sensory deficits.  ED Course  Procedures (including critical care time) Labs Review Results for orders placed or performed during the hospital encounter of 11/91/47  Basic metabolic panel  Result Value Ref Range   Sodium 140 137 - 147 mEq/L   Potassium 4.1 3.7 - 5.3 mEq/L   Chloride 102 96 - 112 mEq/L   CO2 23 19 - 32 mEq/L   Glucose, Bld 77 70 - 99 mg/dL   BUN 12 6 - 23 mg/dL   Creatinine, Ser 1.01 0.50 - 1.35 mg/dL   Calcium 8.8 8.4 - 10.5 mg/dL   GFR calc non Af Amer 85 (L) >90 mL/min   GFR calc Af Amer >90 >90 mL/min   Anion gap 15 5 - 15  CBC  Result Value Ref Range   WBC 4.0 4.0 - 10.5 K/uL   RBC 4.58 4.22 - 5.81 MIL/uL   Hemoglobin 14.0 13.0 - 17.0 g/dL   HCT 40.9 39.0 - 52.0 %   MCV 89.3 78.0 - 100.0 fL   MCH 30.6 26.0 - 34.0 pg   MCHC 34.2 30.0 - 36.0 g/dL   RDW 13.0 11.5 - 15.5 %   Platelets 201 150 - 400 K/uL  BNP (order ONLY if patient complains of dyspnea/SOB AND you have documented it for THIS visit)  Result Value Ref Range   Pro B Natriuretic peptide (BNP) 12.8 0 - 125 pg/mL  D-dimer, quantitative  Result Value Ref Range   D-Dimer, Quant 0.45 0.00 - 0.48 ug/mL-FEU  I-stat troponin, ED (not at [facility])  Result Value Ref Range   Troponin i, poc 0.00 0.00 - 0.08 ng/mL   Comment 3            Imaging Review Dg Chest 2 View  05/19/2014   CLINICAL DATA:  Sharp intermittent midsternal chest pain for 3 days. Prior history of cardiac catheterization.  EXAM: CHEST  2 VIEW  COMPARISON:  Chest radiograph May 03, 2014  FINDINGS: Cardiomediastinal silhouette is unremarkable. The lungs are clear without pleural effusions or focal consolidations. Trachea projects midline and there is no pneumothorax. Soft tissue planes and included osseous structures are non-suspicious.  IMPRESSION: No acute cardiopulmonary process ; normal chest radiograph.   Electronically Signed   By: Elon Alas   On: 05/19/2014 02:41     EKG Interpretation None      MDM   Final diagnoses:  Pleuritic chest pain    Pleuritic chest pain in a pattern that is not particularly worrisome. ECG is normal as is troponin. Old records are reviewed and confirm a recent hospitalization for chest pain which is felt to be due to gastroesophageal reflux. Also, cardiac catheterization in 2013 showed no significant coronary artery disease. D-dimer will be  obtained to rule out pulmonary embolism.  D-dimer is come back normal. He is discharged with prescription for tramadol. He is not placed on NSAIDs because of aspirin allergy.  Delora Fuel, MD [id number]/53/96 [id number]

## 2014-06-05 ENCOUNTER — Emergency Department (HOSPITAL_COMMUNITY)
Admission: EM | Admit: 2014-06-05 | Discharge: 2014-06-05 | Disposition: A | Discharge status: Home / Self Care | Payer: Medicaid Other | Attending: Emergency Medicine | Admitting: Emergency Medicine

## 2014-06-05 ENCOUNTER — Encounter (HOSPITAL_COMMUNITY): Payer: Self-pay

## 2014-06-05 DIAGNOSIS — F319 Bipolar disorder, unspecified: Secondary | ICD-10-CM | POA: Diagnosis not present

## 2014-06-05 DIAGNOSIS — Z8701 Personal history of pneumonia (recurrent): Secondary | ICD-10-CM | POA: Diagnosis not present

## 2014-06-05 DIAGNOSIS — Z720 Tobacco use: Secondary | ICD-10-CM | POA: Diagnosis not present

## 2014-06-05 DIAGNOSIS — Z8739 Personal history of other diseases of the musculoskeletal system and connective tissue: Secondary | ICD-10-CM | POA: Diagnosis not present

## 2014-06-05 DIAGNOSIS — K219 Gastro-esophageal reflux disease without esophagitis: Secondary | ICD-10-CM | POA: Insufficient documentation

## 2014-06-05 DIAGNOSIS — R531 Weakness: Secondary | ICD-10-CM | POA: Diagnosis not present

## 2014-06-05 DIAGNOSIS — K259 Gastric ulcer, unspecified as acute or chronic, without hemorrhage or perforation: Secondary | ICD-10-CM | POA: Diagnosis not present

## 2014-06-05 DIAGNOSIS — R42 Dizziness and giddiness: Secondary | ICD-10-CM | POA: Diagnosis present

## 2014-06-05 DIAGNOSIS — I2510 Atherosclerotic heart disease of native coronary artery without angina pectoris: Secondary | ICD-10-CM | POA: Diagnosis not present

## 2014-06-05 LAB — URINALYSIS, ROUTINE W REFLEX MICROSCOPIC
BILIRUBIN URINE: NEGATIVE
Glucose, UA: NEGATIVE mg/dL
Hgb urine dipstick: NEGATIVE
Ketones, ur: NEGATIVE mg/dL
Leukocytes, UA: NEGATIVE
Nitrite: NEGATIVE
Protein, ur: NEGATIVE mg/dL
Specific Gravity, Urine: 1.01 (ref 1.005–1.030)
Urobilinogen, UA: 0.2 mg/dL (ref 0.0–1.0)
pH: 7.5 (ref 5.0–8.0)

## 2014-06-05 LAB — COMPREHENSIVE METABOLIC PANEL
ALT: 22 U/L (ref 0–53)
AST: 32 U/L (ref 0–37)
Albumin: 3.7 g/dL (ref 3.5–5.2)
Alkaline Phosphatase: 64 U/L (ref 39–117)
Anion gap: 6 (ref 5–15)
BILIRUBIN TOTAL: 0.6 mg/dL (ref 0.3–1.2)
BUN: 8 mg/dL (ref 6–23)
CHLORIDE: 106 meq/L (ref 96–112)
CO2: 26 mmol/L (ref 19–32)
Calcium: 8.9 mg/dL (ref 8.4–10.5)
Creatinine, Ser: 1.29 mg/dL (ref 0.50–1.35)
GFR calc Af Amer: 73 mL/min — ABNORMAL LOW (ref >90)
GFR, EST NON AFRICAN AMERICAN: 63 mL/min — AB (ref >90)
Glucose, Bld: 61 mg/dL — ABNORMAL LOW (ref 70–99)
Potassium: 3.8 mmol/L (ref 3.5–5.1)
SODIUM: 138 mmol/L (ref 135–145)
Total Protein: 6.7 g/dL (ref 6.0–8.3)

## 2014-06-05 LAB — CBC
HCT: 36.3 % — ABNORMAL LOW (ref 39.0–52.0)
Hemoglobin: 12.6 g/dL — ABNORMAL LOW (ref 13.0–17.0)
MCH: 31 pg (ref 26.0–34.0)
MCHC: 34.7 g/dL (ref 30.0–36.0)
MCV: 89.2 fL (ref 78.0–100.0)
Platelets: 214 10*3/uL (ref 150–400)
RBC: 4.07 MIL/uL — AB (ref 4.22–5.81)
RDW: 12.7 % (ref 11.5–15.5)
WBC: 4.1 10*3/uL (ref 4.0–10.5)

## 2014-06-05 LAB — TROPONIN I: Troponin I: <0.03 ng/mL (ref <0.031)

## 2014-06-05 NOTE — ED Provider Notes (Signed)
CSN: [id number]     Arrival date & time 06/05/14  1227 History   First MD Initiated Contact with Patient 06/05/14 1241     Chief Complaint  Patient presents with  . Dizziness     (Consider location/radiation/quality/duration/timing/severity/associated sxs/prior Treatment) The history is provided by the patient.  pt indicates in the past couple of days has felt mildly lightheaded/dizzy at times. No room spinning or vertigo.  Pt very difficult/vague historian - states has felt similarly in past, unsure of cause. No specific exacerbating or alleviating factors. No positional change. No headache. No cough or uri c/o. No chest pain or discomfort of any sort. No unusual doe. No abd pain. No nvd. Normal appetite. No dysuria or gu c/o. No fever or chills. Non compliant w meds, not currently on any medication.     Past Medical History  Diagnosis Date  . Chest pain, mid sternal   . CAD in native artery     a. Nonobstructive cath 11/2007;  b. Presented with ST elevation - Nonobstructive cath 08/2011  . GERD (gastroesophageal reflux disease)   . Tobacco abuse   . Marijuana abuse     a. uses ~ 1x /wk or less  . History of cocaine abuse     a. quit ? 2009  . History of ETOH abuse     a. drinks 2 "40's" / wk  . Bradycardia     a. asymptomatic  . Bipolar disorder   . Syncope     a. 12/2010 - presumed to be vasovagal  . Abnormal ECG     a. early repolarization  . Coronary artery disease   . Pneumonia   . Stomach ulcer   . Arthritis     "my whole left side" (05/03/2014)   Past Surgical History  Procedure Laterality Date  . Cardiac catheterization  2009; 08/2011    [name] 08/06/2011  . Left heart catheterization with coronary angiogram N/A 08/16/2011    Procedure: LEFT HEART CATHETERIZATION WITH CORONARY ANGIOGRAM;  Surgeon: [name], MD;  Location: [facility] CATH LAB;  Service: Cardiovascular;  Laterality: N/A;   No family history on file. History  Substance Use Topics  . Smoking  status: Current Every Day Smoker -- 0.50 packs/day for 20 years    Types: Cigarettes  . Smokeless tobacco: Never Used  . Alcohol Use: 28.8 oz/week    48 Cans of beer per week     Comment: 05/03/2014 "2 40oz beers/day"    Review of Systems  Constitutional: Negative for fever and chills.  HENT: Negative for sore throat.   Eyes: Negative for redness and visual disturbance.  Respiratory: Negative for cough and shortness of breath.   Cardiovascular: Negative for chest pain, palpitations and leg swelling.  Gastrointestinal: Negative for vomiting, abdominal pain and diarrhea.  Endocrine: Negative for polyuria.  Genitourinary: Negative for dysuria and flank pain.  Musculoskeletal: Negative for back pain and neck pain.  Skin: Negative for rash.  Neurological: Positive for light-headedness. Negative for weakness, numbness and headaches.  Hematological: Does not bruise/bleed easily.  Psychiatric/Behavioral: Negative for confusion.      Allergies  Aspirin; Pepperoni; Tomato; and Tylenol  Home Medications   Prior to Admission medications   Medication Sig Start Date End Date Taking? Authorizing Provider  pantoprazole (PROTONIX) 40 MG tablet Take 1 tablet (40 mg total) by mouth daily. Patient not taking: Reported on 06/05/2014 05/04/14   Charlott Rakes, MD  QUEtiapine (SEROQUEL) 200 MG tablet Take 1 tablet (200 mg total)  by mouth at bedtime. Patient not taking: Reported on 05/03/2014 04/23/14   Shuvon Rankin, NP  traMADol (ULTRAM) 50 MG tablet Take 1 tablet (50 mg total) by mouth every 6 (six) hours as needed. Patient not taking: Reported on 06/05/2014 61/60/73   Delora Fuel, MD   BP 710/62 mmHg  Pulse 53  Temp(Src) 98.4 F (36.9 C) (Oral)  Resp 19  SpO2 98% Physical Exam  Constitutional: He is oriented to person, place, and time. No distress.  Very thin appearing.   HENT:  Head: Atraumatic.  Mouth/Throat: Oropharynx is clear and moist.  Eyes: Conjunctivae are normal. Pupils are  equal, round, and reactive to light. No scleral icterus.  Neck: Neck supple. No tracheal deviation present. No thyromegaly present.  Cardiovascular: Normal rate, regular rhythm, normal heart sounds and intact distal pulses.  Exam reveals no gallop and no friction rub.   No murmur heard. Pulmonary/Chest: Effort normal and breath sounds normal. No accessory muscle usage. No respiratory distress.  Abdominal: Soft. Bowel sounds are normal. He exhibits no distension and no mass. There is no tenderness. There is no rebound and no guarding.  Genitourinary:  No cva tenderness.   Musculoskeletal: Normal range of motion. He exhibits no edema or tenderness.  Neurological: He is alert and oriented to person, place, and time.  Motor intact bil, stre 5/5. sens grossly intact. Ambulates w steady gait.   Skin: Skin is warm and dry. No rash noted. He is not diaphoretic.  Psychiatric: He has a normal mood and affect.  Nursing note and vitals reviewed.   ED Course  Procedures (including critical care time) Labs Review  Results for orders placed or performed during the hospital encounter of 06/05/14  CBC  Result Value Ref Range   WBC 4.1 4.0 - 10.5 K/uL   RBC 4.07 (L) 4.22 - 5.81 MIL/uL   Hemoglobin 12.6 (L) 13.0 - 17.0 g/dL   HCT 36.3 (L) 39.0 - 52.0 %   MCV 89.2 78.0 - 100.0 fL   MCH 31.0 26.0 - 34.0 pg   MCHC 34.7 30.0 - 36.0 g/dL   RDW 12.7 11.5 - 15.5 %   Platelets 214 150 - 400 K/uL  Comprehensive metabolic panel  Result Value Ref Range   Sodium 138 135 - 145 mmol/L   Potassium 3.8 3.5 - 5.1 mmol/L   Chloride 106 96 - 112 mEq/L   CO2 26 19 - 32 mmol/L   Glucose, Bld 61 (L) 70 - 99 mg/dL   BUN 8 6 - 23 mg/dL   Creatinine, Ser 1.29 0.50 - 1.35 mg/dL   Calcium 8.9 8.4 - 10.5 mg/dL   Total Protein 6.7 6.0 - 8.3 g/dL   Albumin 3.7 3.5 - 5.2 g/dL   AST 32 0 - 37 U/L   ALT 22 0 - 53 U/L   Alkaline Phosphatase 64 39 - 117 U/L   Total Bilirubin 0.6 0.3 - 1.2 mg/dL   GFR calc non Af Amer 63  (L) >90 mL/min   GFR calc Af Amer 73 (L) >90 mL/min   Anion gap 6 5 - 15  Troponin I  Result Value Ref Range   Troponin I <0.03 <0.031 ng/mL   Dg Chest 2 View  05/19/2014   CLINICAL DATA:  Sharp intermittent midsternal chest pain for 3 days. Prior history of cardiac catheterization.  EXAM: CHEST  2 VIEW  COMPARISON:  Chest radiograph May 03, 2014  FINDINGS: Cardiomediastinal silhouette is unremarkable. The lungs are clear without pleural effusions  or focal consolidations. Trachea projects midline and there is no pneumothorax. Soft tissue planes and included osseous structures are non-suspicious.  IMPRESSION: No acute cardiopulmonary process ; normal chest radiograph.   Electronically Signed   By: Elon Alas   On: 05/19/2014 02:41       EKG Interpretation   Date/Time:  Wednesday June 05 2014 12:28:03 EST Ventricular Rate:  61 PR Interval:  157 QRS Duration: 87 QT Interval:  418 QTC Calculation: 421 R Axis:   48 Text Interpretation:  Sinus rhythm Nonspecific T abnrm ST elev, probable  normal early repol pattern No significant change since last tracing  Confirmed by Ashok Cordia  MD, Lennette Bihari ([id number]) on 06/05/2014 2:17:54 PM      MDM   Labs.  Reviewed nursing notes and prior charts for additional history.   Glucose sl low.  Pt states hadnt eaten today.  Meal/happy meal, po fluids.  Pt denies pain. No fevers. bp normal.   abd soft nt.   Pt appears stable for d/c.      Mirna Mires, MD 06/05/14 [phone]

## 2014-06-05 NOTE — Discharge Instructions (Signed)
It was our pleasure to provide your ER care today - we hope that you feel better.  Rest. Drink plenty of fluids. Eat meals regularly.  Follow up with primary care doctor in coming week.  Return to ER if worse, new symptoms, fevers, chest pain, trouble breathing, fainting, other concern.      Weakness Weakness is a lack of strength. It may be felt all over the body (generalized) or in one specific part of the body (focal). Some causes of weakness can be serious. You may need further medical evaluation, especially if you are elderly or you have a history of immunosuppression (such as chemotherapy or HIV), kidney disease, heart disease, or diabetes. CAUSES  Weakness can be caused by many different things, including:  Infection.  Physical exhaustion.  Internal bleeding or other blood loss that results in a lack of red blood cells (anemia).  Dehydration. This cause is more common in elderly people.  Side effects or electrolyte abnormalities from medicines, such as pain medicines or sedatives.  Emotional distress, anxiety, or depression.  Circulation problems, especially severe peripheral arterial disease.  Heart disease, such as rapid atrial fibrillation, bradycardia, or heart failure.  Nervous system disorders, such as Guillain-Barr syndrome, multiple sclerosis, or stroke. DIAGNOSIS  To find the cause of your weakness, your caregiver will take your history and perform a physical exam. Lab tests or X-rays may also be ordered, if needed. TREATMENT  Treatment of weakness depends on the cause of your symptoms and can vary greatly. HOME CARE INSTRUCTIONS   Rest as needed.  Eat a well-balanced diet.  Try to get some exercise every day.  Only take over-the-counter or prescription medicines as directed by your caregiver. SEEK MEDICAL CARE IF:   Your weakness seems to be getting worse or spreads to other parts of your body.  You develop new aches or pains. SEEK IMMEDIATE  MEDICAL CARE IF:   You cannot perform your normal daily activities, such as getting dressed and feeding yourself.  You cannot walk up and down stairs, or you feel exhausted when you do so.  You have shortness of breath or chest pain.  You have difficulty moving parts of your body.  You have weakness in only one area of the body or on only one side of the body.  You have a fever.  You have trouble speaking or swallowing.  You cannot control your bladder or bowel movements.  You have black or bloody vomit or stools. MAKE SURE YOU:  Understand these instructions.  Will watch your condition.  Will get help right away if you are not doing well or get worse. Document Released: 05/24/2005 Document Revised: 11/23/2011 Document Reviewed: 07/23/2011 [facility] Patient Information 2015 [place], [place]. This information is not intended to replace advice given to you by your health care provider. Make sure you discuss any questions you have with your health care provider.     Dizziness Dizziness is a common problem. It is a feeling of unsteadiness or light-headedness. You may feel like you are about to faint. Dizziness can lead to injury if you stumble or fall. A person of any age group can suffer from dizziness, but dizziness is more common in older adults. CAUSES  Dizziness can be caused by many different things, including:  Middle ear problems.  Standing for too long.  Infections.  An allergic reaction.  Aging.  An emotional response to something, such as the sight of blood.  Side effects of medicines.  Tiredness.  Problems  with circulation or blood pressure.  Excessive use of alcohol or medicines, or illegal drug use.  Breathing too fast (hyperventilation).  An irregular heart rhythm (arrhythmia).  A low red blood cell count (anemia).  Pregnancy.  Vomiting, diarrhea, fever, or other illnesses that cause body fluid loss (dehydration).  Diseases or conditions  such as Parkinson's disease, high blood pressure (hypertension), diabetes, and thyroid problems.  Exposure to extreme heat. DIAGNOSIS  Your health care provider will ask about your symptoms, perform a physical exam, and perform an electrocardiogram (ECG) to record the electrical activity of your heart. Your health care provider may also perform other heart or blood tests to determine the cause of your dizziness. These may include:  Transthoracic echocardiogram (TTE). During echocardiography, sound waves are used to evaluate how blood flows through your heart.  Transesophageal echocardiogram (TEE).  Cardiac monitoring. This allows your health care provider to monitor your heart rate and rhythm in real time.  Holter monitor. This is a portable device that records your heartbeat and can help diagnose heart arrhythmias. It allows your health care provider to track your heart activity for several days if needed.  Stress tests by exercise or by giving medicine that makes the heart beat faster. TREATMENT  Treatment of dizziness depends on the cause of your symptoms and can vary greatly. HOME CARE INSTRUCTIONS   Drink enough fluids to keep your urine clear or pale yellow. This is especially important in very hot weather. In older adults, it is also important in cold weather.  Take your medicine exactly as directed if your dizziness is caused by medicines. When taking blood pressure medicines, it is especially important to get up slowly.  Rise slowly from chairs and steady yourself until you feel okay.  In the morning, first sit up on the side of the bed. When you feel okay, stand slowly while holding onto something until you know your balance is fine.  Move your legs often if you need to stand in one place for a long time. Tighten and relax your muscles in your legs while standing.  Have someone stay with you for 1-2 days if dizziness continues to be a problem. Do this until you feel you are  well enough to stay alone. Have the person call your health care provider if he or she notices changes in you that are concerning.  Do not drive or use heavy machinery if you feel dizzy.  Do not drink alcohol. SEEK IMMEDIATE MEDICAL CARE IF:   Your dizziness or light-headedness gets worse.  You feel nauseous or vomit.  You have problems talking, walking, or using your arms, hands, or legs.  You feel weak.  You are not thinking clearly or you have trouble forming sentences. It may take a friend or family member to notice this.  You have chest pain, abdominal pain, shortness of breath, or sweating.  Your vision changes.  You notice any bleeding.  You have side effects from medicine that seems to be getting worse rather than better. MAKE SURE YOU:   Understand these instructions.  Will watch your condition.  Will get help right away if you are not doing well or get worse. Document Released: 11/17/2000 Document Revised: 05/29/2013 Document Reviewed: 12/11/2010 [facility] Patient Information 2015 [place], [place]. This information is not intended to replace advice given to you by your health care provider. Make sure you discuss any questions you have with your health care provider.    Near-Syncope Near-syncope (commonly known as near  fainting) is sudden weakness, dizziness, or feeling like you might pass out. During an episode of near-syncope, you may also develop pale skin, have tunnel vision, or feel sick to your stomach (nauseous). Near-syncope may occur when getting up after sitting or while standing for a long time. It is caused by a sudden decrease in blood flow to the brain. This decrease can result from various causes or triggers, most of which are not serious. However, because near-syncope can sometimes be a sign of something serious, a medical evaluation is required. The specific cause is often not determined. HOME CARE INSTRUCTIONS  Monitor your condition for any changes.  The following actions may help to alleviate any discomfort you are experiencing:  Have someone stay with you until you feel stable.  Lie down right away and prop your feet up if you start feeling like you might faint. Breathe deeply and steadily. Wait until all the symptoms have passed. Most of these episodes last only a few minutes. You may feel tired for several hours.   Drink enough fluids to keep your urine clear or pale yellow.   If you are taking blood pressure or heart medicine, get up slowly when seated or lying down. Take several minutes to sit and then stand. This can reduce dizziness.  Follow up with your health care provider as directed. SEEK IMMEDIATE MEDICAL CARE IF:   You have a severe headache.   You have unusual pain in the chest, abdomen, or back.   You are bleeding from the mouth or rectum, or you have black or tarry stool.   You have an irregular or very fast heartbeat.   You have repeated fainting or have seizure-like jerking during an episode.   You faint when sitting or lying down.   You have confusion.   You have difficulty walking.   You have severe weakness.   You have vision problems.  MAKE SURE YOU:   Understand these instructions.  Will watch your condition.  Will get help right away if you are not doing well or get worse. Document Released: 05/24/2005 Document Revised: 05/29/2013 Document Reviewed: 10/27/2012 [facility] Patient Information 2015 [place], [place]. This information is not intended to replace advice given to you by your health care provider. Make sure you discuss any questions you have with your health care provider.    Hypoglycemia Hypoglycemia occurs when the glucose in your blood is too low. Glucose is a type of sugar that is your body's main energy source. Hormones, such as insulin and glucagon, control the level of glucose in the blood. Insulin lowers blood glucose and glucagon increases blood glucose. Having too  much insulin in your blood stream, or not eating enough food containing sugar, can result in hypoglycemia. Hypoglycemia can happen to people with or without diabetes. It can develop quickly and can be a medical emergency.  CAUSES   Missing or delaying meals.  Not eating enough carbohydrates at meals.  Taking too much diabetes medicine.  Not timing your oral diabetes medicine or insulin doses with meals, snacks, and exercise.  Nausea and vomiting.  Certain medicines.  Severe illnesses, such as hepatitis, kidney disorders, and certain eating disorders.  Increased activity or exercise without eating something extra or adjusting medicines.  Drinking too much alcohol.  A nerve disorder that affects body functions like your heart rate, blood pressure, and digestion (autonomic neuropathy).  A condition where the stomach muscles do not function properly (gastroparesis). Therefore, medicines and food may not absorb properly.  Rarely, a tumor of the pancreas can produce too much insulin. SYMPTOMS   Hunger.  Sweating (diaphoresis).  Change in body temperature.  Shakiness.  Headache.  Anxiety.  Lightheadedness.  Irritability.  Difficulty concentrating.  Dry mouth.  Tingling or numbness in the hands or feet.  Restless sleep or sleep disturbances.  Altered speech and coordination.  Change in mental status.  Seizures or prolonged convulsions.  Combativeness.  Drowsiness (lethargic).  Weakness.  Increased heart rate or palpitations.  Confusion.  Pale, gray skin color.  Blurred or double vision.  Fainting. DIAGNOSIS  A physical exam and medical history will be performed. Your caregiver may make a diagnosis based on your symptoms. Blood tests and other lab tests may be performed to confirm a diagnosis. Once the diagnosis is made, your caregiver will see if your signs and symptoms go away once your blood glucose is raised.  TREATMENT  Usually, you can easily  treat your hypoglycemia when you notice symptoms.  Check your blood glucose. If it is less than 70 mg/dl, take one of the following:   3-4 glucose tablets.    cup juice.    cup regular soda.   1 cup skim milk.   -1 tube of glucose gel.   5-6 hard candies.   Avoid high-fat drinks or food that may delay a rise in blood glucose levels.  Do not take more than the recommended amount of sugary foods, drinks, gel, or tablets. Doing so will cause your blood glucose to go too high.   Wait 10-15 minutes and recheck your blood glucose. If it is still less than 70 mg/dl or below your target range, repeat treatment.   Eat a snack if it is more than 1 hour until your next meal.  There may be a time when your blood glucose may go so low that you are unable to treat yourself at home when you start to notice symptoms. You may need someone to help you. You may even faint or be unable to swallow. If you cannot treat yourself, someone will need to bring you to the hospital.  [place]  If you have diabetes, follow your diabetes management plan by:  Taking your medicines as directed.  Following your exercise plan.  Following your meal plan. Do not skip meals. Eat on time.  Testing your blood glucose regularly. Check your blood glucose before and after exercise. If you exercise longer or different than usual, be sure to check blood glucose more frequently.  Wearing your medical alert jewelry that says you have diabetes.  Identify the cause of your hypoglycemia. Then, develop ways to prevent the recurrence of hypoglycemia.  Do not take a hot bath or shower right after an insulin shot.  Always carry treatment with you. Glucose tablets are the easiest to carry.  If you are going to drink alcohol, drink it only with meals.  Tell friends or family members ways to keep you safe during a seizure. This may include removing hard or sharp objects from the area or turning you  on your side.  Maintain a healthy weight. SEEK MEDICAL CARE IF:   You are having problems keeping your blood glucose in your target range.  You are having frequent episodes of hypoglycemia.  You feel you might be having side effects from your medicines.  You are not sure why your blood glucose is dropping so low.  You notice a change in vision or a new problem with your vision. SEEK IMMEDIATE  MEDICAL CARE IF:   Confusion develops.  A change in mental status occurs.  The inability to swallow develops.  Fainting occurs. Document Released: 05/24/2005 Document Revised: 05/29/2013 Document Reviewed: 09/20/2011 [facility] Patient Information 2015 [place], [place]. This information is not intended to replace advice given to you by your health care provider. Make sure you discuss any questions you have with your health care provider.

## 2014-06-05 NOTE — ED Notes (Signed)
Per EMS, Patient started to feel dizzy yesterday. This morning he woke up feel nauseated. Patient has been non-compliant with medications due to finances. Patient reports he felt like he was going to pass out last night. Patient is homeless and does not have any medication. Patient has a history of CABG and MI. Patient alert and oriented x4 when he arrived to ED. Vitals per EMS: 118/66, 54 HR, 18 RR, 118 CBG, 98 %. No Orthostatic Changes. 16 Gauge IV in Left Forearm.

## 2014-07-03 ENCOUNTER — Other Ambulatory Visit: Payer: Self-pay | Admitting: Internal Medicine

## 2014-10-14 ENCOUNTER — Encounter (HOSPITAL_COMMUNITY): Payer: Self-pay | Admitting: Cardiology

## 2014-10-14 ENCOUNTER — Emergency Department (HOSPITAL_COMMUNITY)
Admission: EM | Admit: 2014-10-14 | Discharge: 2014-10-14 | Discharge status: Against Medical Advice | Payer: Medicaid Other | Attending: Emergency Medicine | Admitting: Emergency Medicine

## 2014-10-14 DIAGNOSIS — L98499 Non-pressure chronic ulcer of skin of other sites with unspecified severity: Secondary | ICD-10-CM | POA: Insufficient documentation

## 2014-10-14 DIAGNOSIS — R109 Unspecified abdominal pain: Secondary | ICD-10-CM | POA: Insufficient documentation

## 2014-10-14 DIAGNOSIS — I2510 Atherosclerotic heart disease of native coronary artery without angina pectoris: Secondary | ICD-10-CM | POA: Insufficient documentation

## 2014-10-14 DIAGNOSIS — Z720 Tobacco use: Secondary | ICD-10-CM | POA: Diagnosis not present

## 2014-10-14 LAB — COMPREHENSIVE METABOLIC PANEL
ALK PHOS: 61 U/L (ref 38–126)
ALT: 25 U/L (ref 17–63)
AST: 24 U/L (ref 15–41)
Albumin: 3.8 g/dL (ref 3.5–5.0)
Anion gap: 5 (ref 5–15)
BUN: 10 mg/dL (ref 6–20)
CALCIUM: 9.2 mg/dL (ref 8.9–10.3)
CO2: 28 mmol/L (ref 22–32)
Chloride: 106 mmol/L (ref 101–111)
Creatinine, Ser: 1.08 mg/dL (ref 0.61–1.24)
GLUCOSE: 87 mg/dL (ref 70–99)
POTASSIUM: 4.3 mmol/L (ref 3.5–5.1)
SODIUM: 139 mmol/L (ref 135–145)
TOTAL PROTEIN: 6.9 g/dL (ref 6.5–8.1)
Total Bilirubin: 0.7 mg/dL (ref 0.3–1.2)

## 2014-10-14 LAB — CBC WITH DIFFERENTIAL/PLATELET
BASOS ABS: 0 10*3/uL (ref 0.0–0.1)
Basophils Relative: 0 % (ref 0–1)
EOS ABS: 0.2 10*3/uL (ref 0.0–0.7)
Eosinophils Relative: 4 % (ref 0–5)
HCT: 39.9 % (ref 39.0–52.0)
Hemoglobin: 13.7 g/dL (ref 13.0–17.0)
LYMPHS ABS: 1.5 10*3/uL (ref 0.7–4.0)
Lymphocytes Relative: 39 % (ref 12–46)
MCH: 31.8 pg (ref 26.0–34.0)
MCHC: 34.3 g/dL (ref 30.0–36.0)
MCV: 92.6 fL (ref 78.0–100.0)
Monocytes Absolute: 0.4 10*3/uL (ref 0.1–1.0)
Monocytes Relative: 12 % (ref 3–12)
NEUTROS PCT: 45 % (ref 43–77)
Neutro Abs: 1.6 10*3/uL — ABNORMAL LOW (ref 1.7–7.7)
PLATELETS: 196 10*3/uL (ref 150–400)
RBC: 4.31 MIL/uL (ref 4.22–5.81)
RDW: 13.6 % (ref 11.5–15.5)
WBC: 3.7 10*3/uL — ABNORMAL LOW (ref 4.0–10.5)

## 2014-10-14 LAB — LIPASE, BLOOD: LIPASE: 44 U/L (ref 22–51)

## 2014-10-14 NOTE — ED Notes (Signed)
Pt updated on current wait time

## 2014-10-14 NOTE — ED Notes (Signed)
Pt reports he has been having abd pain for the past couple of weeks. Reports the pain is worse with eating. States he has an ulcer.

## 2014-10-14 NOTE — ED Notes (Signed)
Pt called in main ED waiting area with no response 

## 2014-10-16 ENCOUNTER — Encounter (HOSPITAL_COMMUNITY): Payer: Self-pay

## 2014-10-16 ENCOUNTER — Other Ambulatory Visit: Payer: Self-pay

## 2014-10-16 ENCOUNTER — Emergency Department (HOSPITAL_COMMUNITY): Payer: Medicaid Other

## 2014-10-16 ENCOUNTER — Emergency Department (HOSPITAL_COMMUNITY)
Admission: EM | Admit: 2014-10-16 | Discharge: 2014-10-16 | Disposition: A | Discharge status: Home / Self Care | Payer: Medicaid Other | Attending: Emergency Medicine | Admitting: Emergency Medicine

## 2014-10-16 DIAGNOSIS — R079 Chest pain, unspecified: Secondary | ICD-10-CM | POA: Diagnosis present

## 2014-10-16 DIAGNOSIS — I2510 Atherosclerotic heart disease of native coronary artery without angina pectoris: Secondary | ICD-10-CM | POA: Diagnosis not present

## 2014-10-16 DIAGNOSIS — F319 Bipolar disorder, unspecified: Secondary | ICD-10-CM | POA: Diagnosis not present

## 2014-10-16 DIAGNOSIS — Z720 Tobacco use: Secondary | ICD-10-CM | POA: Diagnosis not present

## 2014-10-16 DIAGNOSIS — K219 Gastro-esophageal reflux disease without esophagitis: Secondary | ICD-10-CM | POA: Insufficient documentation

## 2014-10-16 DIAGNOSIS — O26899 Other specified pregnancy related conditions, unspecified trimester: Secondary | ICD-10-CM

## 2014-10-16 DIAGNOSIS — Z9889 Other specified postprocedural states: Secondary | ICD-10-CM | POA: Insufficient documentation

## 2014-10-16 DIAGNOSIS — Z8739 Personal history of other diseases of the musculoskeletal system and connective tissue: Secondary | ICD-10-CM | POA: Diagnosis not present

## 2014-10-16 DIAGNOSIS — Z8701 Personal history of pneumonia (recurrent): Secondary | ICD-10-CM | POA: Diagnosis not present

## 2014-10-16 DIAGNOSIS — R1013 Epigastric pain: Secondary | ICD-10-CM | POA: Insufficient documentation

## 2014-10-16 DIAGNOSIS — Z79899 Other long term (current) drug therapy: Secondary | ICD-10-CM | POA: Insufficient documentation

## 2014-10-16 DIAGNOSIS — R109 Unspecified abdominal pain: Secondary | ICD-10-CM

## 2014-10-16 DIAGNOSIS — R001 Bradycardia, unspecified: Secondary | ICD-10-CM | POA: Diagnosis not present

## 2014-10-16 LAB — CBC WITH DIFFERENTIAL/PLATELET
Basophils Absolute: 0 10*3/uL (ref 0.0–0.1)
Basophils Relative: 0 % (ref 0–1)
Eosinophils Absolute: 0.2 10*3/uL (ref 0.0–0.7)
Eosinophils Relative: 4 % (ref 0–5)
HEMATOCRIT: 41.4 % (ref 39.0–52.0)
Hemoglobin: 14.4 g/dL (ref 13.0–17.0)
Lymphocytes Relative: 45 % (ref 12–46)
Lymphs Abs: 2.3 10*3/uL (ref 0.7–4.0)
MCH: 32.3 pg (ref 26.0–34.0)
MCHC: 34.8 g/dL (ref 30.0–36.0)
MCV: 92.8 fL (ref 78.0–100.0)
Monocytes Absolute: 0.6 10*3/uL (ref 0.1–1.0)
Monocytes Relative: 12 % (ref 3–12)
NEUTROS ABS: 2 10*3/uL (ref 1.7–7.7)
Neutrophils Relative %: 39 % — ABNORMAL LOW (ref 43–77)
PLATELETS: 199 10*3/uL (ref 150–400)
RBC: 4.46 MIL/uL (ref 4.22–5.81)
RDW: 13.7 % (ref 11.5–15.5)
WBC: 5.1 10*3/uL (ref 4.0–10.5)

## 2014-10-16 LAB — COMPREHENSIVE METABOLIC PANEL
ALT: 25 U/L (ref 17–63)
AST: 35 U/L (ref 15–41)
Albumin: 3.9 g/dL (ref 3.5–5.0)
Alkaline Phosphatase: 60 U/L (ref 38–126)
Anion gap: 11 (ref 5–15)
BUN: 11 mg/dL (ref 6–20)
CALCIUM: 9.4 mg/dL (ref 8.9–10.3)
CHLORIDE: 103 mmol/L (ref 101–111)
CO2: 24 mmol/L (ref 22–32)
Creatinine, Ser: 1.09 mg/dL (ref 0.61–1.24)
GLUCOSE: 92 mg/dL (ref 70–99)
POTASSIUM: 4.3 mmol/L (ref 3.5–5.1)
Sodium: 138 mmol/L (ref 135–145)
TOTAL PROTEIN: 6.9 g/dL (ref 6.5–8.1)
Total Bilirubin: 0.6 mg/dL (ref 0.3–1.2)

## 2014-10-16 LAB — ETHANOL

## 2014-10-16 LAB — I-STAT CG4 LACTIC ACID, ED: LACTIC ACID, VENOUS: 1.26 mmol/L (ref 0.5–2.0)

## 2014-10-16 LAB — LIPASE, BLOOD: Lipase: 52 U/L — ABNORMAL HIGH (ref 22–51)

## 2014-10-16 MED ORDER — GI COCKTAIL ~~LOC~~: 30.0000 mL | Freq: Once | ORAL | Status: DC

## 2014-10-16 MED ORDER — IOHEXOL 300 MG/ML  SOLN
100.0000 mL | Freq: Once | INTRAMUSCULAR | Status: AC | PRN
  Administered 2014-10-16: 100 mL via INTRAVENOUS

## 2014-10-16 MED ORDER — ALUM & MAG HYDROXIDE-SIMETH 200-200-20 MG/5ML PO SUSP
30.0000 mL | Freq: Once | ORAL | Status: AC
  Administered 2014-10-16: 30 mL via ORAL
  Filled 2014-10-16: qty 30

## 2014-10-16 MED ORDER — IOHEXOL 300 MG/ML  SOLN: 25.0000 mL | Freq: Once | INTRAMUSCULAR | Status: DC | PRN

## 2014-10-16 MED ORDER — RANITIDINE HCL 150 MG/10ML PO SYRP
300.0000 mg | ORAL_SOLUTION | Freq: Once | ORAL | Status: AC
  Administered 2014-10-16: 300 mg via ORAL
  Filled 2014-10-16: qty 20

## 2014-10-16 MED ORDER — SODIUM CHLORIDE 0.9 % IV BOLUS (SEPSIS)
1000.0000 mL | Freq: Once | INTRAVENOUS | Status: AC
  Administered 2014-10-16: 1000 mL via INTRAVENOUS

## 2014-10-16 NOTE — ED Notes (Signed)
Pt here by [facility] for chest pain, pt was called a stemi on arrival pt found bradycardic with diffuse elevation on ekg. No stemi paged, pt reports pain 4/10 currently, pt given ntg with ems and 4 baby asa, and it helped pain ease off. Pt with hx of MI and noncompliant with meds.

## 2014-10-16 NOTE — ED Provider Notes (Signed)
CSN: [id number]     Arrival date & time 10/16/14  8184 History  This chart was scribed for Everlene Balls, MD by Delphia Grates, ED Scribe. This patient was seen in room [facility] and the patient's care was started at 1:21 AM.    Chief Complaint  Patient presents with  . Chest Pain    The history is provided by the patient. No language interpreter was used.     HPI Comments: LARREN COPES is a 49 y.o. male, with history of CAD, brought in by ambulance, who presents to the Emergency Department complaining of intermittent, 4/10, epigastric abdominal pain for the past week. He reports the abdominal pain typically occurs in the mornings, but is unsure of any specific triggers. There is associated nausea and dry cough. Patient has history of GERD and reports relief when taking Zantac. He reports history of prior similar pain that was associated with a stomach ulcer. He denies vomiting or diarrhea.   Past Medical History  Diagnosis Date  . Chest pain, mid sternal   . CAD in native artery     a. Nonobstructive cath 11/2007;  b. Presented with ST elevation - Nonobstructive cath 08/2011  . GERD (gastroesophageal reflux disease)   . Tobacco abuse   . Marijuana abuse     a. uses ~ 1x /wk or less  . History of cocaine abuse     a. quit ? 2009  . History of ETOH abuse     a. drinks 2 "40's" / wk  . Bradycardia     a. asymptomatic  . Bipolar disorder   . Syncope     a. 12/2010 - presumed to be vasovagal  . Abnormal ECG     a. early repolarization  . Coronary artery disease   . Pneumonia   . Stomach ulcer   . Arthritis     "my whole left side" (05/03/2014)   Past Surgical History  Procedure Laterality Date  . Cardiac catheterization  2009; 08/2011    [name] 08/06/2011  . Left heart catheterization with coronary angiogram N/A 08/16/2011    Procedure: LEFT HEART CATHETERIZATION WITH CORONARY ANGIOGRAM;  Surgeon: [name], MD;  Location: [facility] CATH LAB;  Service: Cardiovascular;   Laterality: N/A;   History reviewed. No pertinent family history. History  Substance Use Topics  . Smoking status: Current Every Day Smoker -- 0.50 packs/day for 20 years    Types: Cigarettes  . Smokeless tobacco: Never Used  . Alcohol Use: 28.8 oz/week    48 Cans of beer per week     Comment: 05/03/2014 "2 40oz beers/day"    Review of Systems  A complete 10 system review of systems was obtained and all systems are negative except as noted in the HPI and PMH.    Allergies  Aspirin; Pepperoni; Tomato; and Tylenol  Home Medications   Prior to Admission medications   Medication Sig Start Date End Date Taking? Authorizing Provider  pantoprazole (PROTONIX) 40 MG tablet Take 1 tablet (40 mg total) by mouth daily. Patient not taking: Reported on 06/05/2014 05/04/14   Riccardo Dubin, MD  QUEtiapine (SEROQUEL) 200 MG tablet Take 1 tablet (200 mg total) by mouth at bedtime. Patient not taking: Reported on 05/03/2014 04/23/14   Shuvon B Rankin, NP  traMADol (ULTRAM) 50 MG tablet Take 1 tablet (50 mg total) by mouth every 6 (six) hours as needed. Patient not taking: Reported on 06/05/2014 03/75/43   Delora Fuel, MD   Triage Vitals:  BP 97/64 mmHg  Pulse 48  Temp(Src) 97.5 F (36.4 C) (Oral)  Resp 16  Ht 5\' 4"  (1.626 m)  Wt 129 lb (58.514 kg)  BMI 22.13 kg/m2  SpO2 99%  Physical Exam  Constitutional: He is oriented to person, place, and time. Vital signs are normal. He appears well-developed and well-nourished.  Non-toxic appearance. He does not appear ill. No distress.  HENT:  Head: Normocephalic and atraumatic.  Nose: Nose normal.  Mouth/Throat: Oropharynx is clear and moist. No oropharyngeal exudate.  Eyes: Conjunctivae and EOM are normal. Pupils are equal, round, and reactive to light. No scleral icterus.  Neck: Normal range of motion. Neck supple. No tracheal deviation, no edema, no erythema and normal range of motion present. No thyroid mass and no thyromegaly present.   Cardiovascular: Regular rhythm, S1 normal, S2 normal, normal heart sounds, intact distal pulses and normal pulses.  Bradycardia present.  Exam reveals no gallop and no friction rub.   No murmur heard. Pulses:      Radial pulses are 2+ on the right side, and 2+ on the left side.       Dorsalis pedis pulses are 2+ on the right side, and 2+ on the left side.  Pulmonary/Chest: Effort normal and breath sounds normal. No respiratory distress. He has no wheezes. He has no rhonchi. He has no rales.  Abdominal: Soft. Normal appearance and bowel sounds are normal. He exhibits no distension, no ascites and no mass. There is no hepatosplenomegaly. There is tenderness in the epigastric area. There is no rebound, no guarding and no CVA tenderness.  Musculoskeletal: Normal range of motion. He exhibits no edema or tenderness.  Lymphadenopathy:    He has no cervical adenopathy.  Neurological: He is alert and oriented to person, place, and time. He has normal strength. No cranial nerve deficit or sensory deficit.  Skin: Skin is warm, dry and intact. No petechiae and no rash noted. He is not diaphoretic. No erythema. No pallor.  Psychiatric: He has a normal mood and affect. His behavior is normal. Judgment normal.  Nursing note and vitals reviewed.   ED Course  Procedures (including critical care time)  DIAGNOSTIC STUDIES: Oxygen Saturation is 99% on room air, normal by my interpretation.    COORDINATION OF CARE: At 0129 Discussed treatment plan with patient which includes imaging. Patient agrees.   Labs Review Labs Reviewed  CBC WITH DIFFERENTIAL/PLATELET - Abnormal; Notable for the following:    Neutrophils Relative % 39 (*)    All other components within normal limits  LIPASE, BLOOD - Abnormal; Notable for the following:    Lipase 52 (*)    All other components within normal limits  COMPREHENSIVE METABOLIC PANEL  ETHANOL  I-STAT CG4 LACTIC ACID, ED    Imaging Review Ct Abdomen Pelvis W  Contrast  10/16/2014   CLINICAL DATA:  Intermittent epigastric abdominal pain for a week. Typically occurs in morning. Nausea. Dry cough.  EXAM: CT ABDOMEN AND PELVIS WITH CONTRAST  TECHNIQUE: Multidetector CT imaging of the abdomen and pelvis was performed using the standard protocol following bolus administration of intravenous contrast.  CONTRAST:  122mL OMNIPAQUE IOHEXOL 300 MG/ML  SOLN  COMPARISON:  03/20/2012  FINDINGS: Mild dependent atelectasis in the lung bases.  Focal lesion in segment 6 of the liver has imaging features consistent with cavernous hemangioma. No change since previous study. No other focal liver lesions identified. Gallbladder is contracted, likely physiologic. Pancreas, adrenal glands, spleen, kidneys, abdominal aorta, inferior vena cava, and  retroperitoneal lymph nodes are unremarkable. Stomach, small bowel, and colon are not abnormally distended. No free air or free fluid in the abdomen.  Pelvis: Bladder wall is not thickened. Prostate gland is enlarged. No free or loculated pelvic fluid collections. No pelvic mass or lymphadenopathy. Appendix is not identified. No destructive bone lesions.  IMPRESSION: No acute abnormality demonstrated in the abdomen or pelvis. No evidence of bowel obstruction or inflammation. Incidental cavernous hemangioma in the liver.   Electronically Signed   By: Lucienne Capers M.D.   On: 10/16/2014 04:51     EKG Interpretation   Date/Time:  Wednesday Oct 16 2014 00:51:16 EDT Ventricular Rate:  47 PR Interval:  186 QRS Duration: 87 QT Interval:  426 QTC Calculation: 377 R Axis:   49 Text Interpretation:  Sinus bradycardia RSR' in V1 or V2, probably normal  variant ST elev, probable normal early repol pattern No significant change  since last tracing Confirmed by Glynn Octave [phone]) on 10/16/2014  5:29:21 AM      MDM   Final diagnoses:  None   Patient presents emergency department for midepigastric abdominal pain. She has noticed  Notes chest pain however patient clearly points to his mid epigastrium. He has a history of reflux and ulcers in the past. He was given ranitidine and Maalox emergency department with good pain relief. CT scan was obtained which does not show any acute abnormalities in the abdomen or pelvis. Patient was observed overnight in emergency department for several hours. He's noted to be bradycardic however he was sleeping throughout and no acute distress.Marland Kitchen His EKGs also show bradycardia. His vital signs remain within his normal limits and is safe for discharge with primary care follow-up within 3 days. Patient was encouraged to continue his Protonix on a daily basis.  I personally performed the services described in this documentation, which was scribed in my presence. The recorded information has been reviewed and is accurate.   Everlene Balls, MD 10/16/14 0530

## 2014-10-16 NOTE — Discharge Instructions (Signed)
Abdominal Pain Mr. Tomberlin, continue to take Protonix every day and see a primary care physician within 3 days for close follow-up. Your CT scan today was normal. If symptoms worsen come back to the emergency department immediately. Thank you. Many things can cause belly (abdominal) pain. Most times, the belly pain is not dangerous. Many cases of belly pain can be watched and treated at home. HOME CARE   Do not take medicines that help you go poop (laxatives) unless told to by your doctor.  Only take medicine as told by your doctor.  Eat or drink as told by your doctor. Your doctor will tell you if you should be on a special diet. GET HELP IF:  You do not know what is causing your belly pain.  You have belly pain while you are sick to your stomach (nauseous) or have runny poop (diarrhea).  You have pain while you pee or poop.  Your belly pain wakes you up at night.  You have belly pain that gets worse or better when you eat.  You have belly pain that gets worse when you eat fatty foods.  You have a fever. GET HELP RIGHT AWAY IF:   The pain does not go away within 2 hours.  You keep throwing up (vomiting).  The pain changes and is only in the right or left part of the belly.  You have bloody or tarry looking poop. MAKE SURE YOU:   Understand these instructions.  Will watch your condition.  Will get help right away if you are not doing well or get worse. Document Released: 11/10/2007 Document Revised: 05/29/2013 Document Reviewed: 01/31/2013 [facility] Patient Information 2015 [place], [place]. This information is not intended to replace advice given to you by your health care provider. Make sure you discuss any questions you have with your health care provider.

## 2014-10-20 ENCOUNTER — Encounter (HOSPITAL_COMMUNITY): Payer: Self-pay | Admitting: Emergency Medicine

## 2014-10-20 ENCOUNTER — Emergency Department (HOSPITAL_COMMUNITY)
Admission: EM | Admit: 2014-10-20 | Discharge: 2014-10-20 | Disposition: A | Discharge status: Home / Self Care | Payer: Medicaid Other | Attending: Emergency Medicine | Admitting: Emergency Medicine

## 2014-10-20 DIAGNOSIS — R112 Nausea with vomiting, unspecified: Secondary | ICD-10-CM | POA: Diagnosis not present

## 2014-10-20 DIAGNOSIS — F319 Bipolar disorder, unspecified: Secondary | ICD-10-CM | POA: Diagnosis not present

## 2014-10-20 DIAGNOSIS — R1013 Epigastric pain: Secondary | ICD-10-CM | POA: Diagnosis present

## 2014-10-20 DIAGNOSIS — Z8701 Personal history of pneumonia (recurrent): Secondary | ICD-10-CM | POA: Insufficient documentation

## 2014-10-20 DIAGNOSIS — K219 Gastro-esophageal reflux disease without esophagitis: Secondary | ICD-10-CM | POA: Insufficient documentation

## 2014-10-20 DIAGNOSIS — Z720 Tobacco use: Secondary | ICD-10-CM | POA: Insufficient documentation

## 2014-10-20 DIAGNOSIS — I2510 Atherosclerotic heart disease of native coronary artery without angina pectoris: Secondary | ICD-10-CM | POA: Insufficient documentation

## 2014-10-20 DIAGNOSIS — Z8739 Personal history of other diseases of the musculoskeletal system and connective tissue: Secondary | ICD-10-CM | POA: Insufficient documentation

## 2014-10-20 LAB — COMPREHENSIVE METABOLIC PANEL
ALBUMIN: 4.5 g/dL (ref 3.5–5.0)
ALT: 19 U/L (ref 17–63)
ANION GAP: 10 (ref 5–15)
AST: 24 U/L (ref 15–41)
Alkaline Phosphatase: 62 U/L (ref 38–126)
BUN: 12 mg/dL (ref 6–20)
CO2: 24 mmol/L (ref 22–32)
Calcium: 8.9 mg/dL (ref 8.9–10.3)
Chloride: 102 mmol/L (ref 101–111)
Creatinine, Ser: 1.18 mg/dL (ref 0.61–1.24)
GFR calc Af Amer: >60 mL/min (ref >60)
GFR calc non Af Amer: >60 mL/min (ref >60)
GLUCOSE: 92 mg/dL (ref 65–99)
POTASSIUM: 4 mmol/L (ref 3.5–5.1)
Sodium: 136 mmol/L (ref 135–145)
Total Bilirubin: 0.7 mg/dL (ref 0.3–1.2)
Total Protein: 7.9 g/dL (ref 6.5–8.1)

## 2014-10-20 LAB — CBC WITH DIFFERENTIAL/PLATELET
Basophils Absolute: 0 10*3/uL (ref 0.0–0.1)
Basophils Relative: 0 % (ref 0–1)
EOS ABS: 0.1 10*3/uL (ref 0.0–0.7)
Eosinophils Relative: 1 % (ref 0–5)
HCT: 40 % (ref 39.0–52.0)
HEMOGLOBIN: 13.5 g/dL (ref 13.0–17.0)
LYMPHS PCT: 25 % (ref 12–46)
Lymphs Abs: 1.9 10*3/uL (ref 0.7–4.0)
MCH: 31 pg (ref 26.0–34.0)
MCHC: 33.8 g/dL (ref 30.0–36.0)
MCV: 92 fL (ref 78.0–100.0)
MONO ABS: 0.8 10*3/uL (ref 0.1–1.0)
Monocytes Relative: 11 % (ref 3–12)
NEUTROS ABS: 4.9 10*3/uL (ref 1.7–7.7)
Neutrophils Relative %: 63 % (ref 43–77)
Platelets: 217 10*3/uL (ref 150–400)
RBC: 4.35 MIL/uL (ref 4.22–5.81)
RDW: 13.2 % (ref 11.5–15.5)
WBC: 7.7 10*3/uL (ref 4.0–10.5)

## 2014-10-20 LAB — URINALYSIS, ROUTINE W REFLEX MICROSCOPIC
Bilirubin Urine: NEGATIVE
Glucose, UA: NEGATIVE mg/dL
HGB URINE DIPSTICK: NEGATIVE
KETONES UR: NEGATIVE mg/dL
LEUKOCYTES UA: NEGATIVE
Nitrite: NEGATIVE
PROTEIN: NEGATIVE mg/dL
Specific Gravity, Urine: 1.003 — ABNORMAL LOW (ref 1.005–1.030)
UROBILINOGEN UA: 0.2 mg/dL (ref 0.0–1.0)
pH: 5.5 (ref 5.0–8.0)

## 2014-10-20 LAB — LIPASE, BLOOD: Lipase: 37 U/L (ref 22–51)

## 2014-10-20 LAB — I-STAT TROPONIN, ED: Troponin i, poc: 0 ng/mL (ref 0.00–0.08)

## 2014-10-20 MED ORDER — GI COCKTAIL ~~LOC~~
30.0000 mL | Freq: Once | ORAL | Status: AC
  Administered 2014-10-20: 30 mL via ORAL
  Filled 2014-10-20: qty 30

## 2014-10-20 MED ORDER — FAMOTIDINE IN NACL 20-0.9 MG/50ML-% IV SOLN
20.0000 mg | Freq: Once | INTRAVENOUS | Status: AC
  Administered 2014-10-20: 20 mg via INTRAVENOUS
  Filled 2014-10-20: qty 50

## 2014-10-20 MED ORDER — ONDANSETRON HCL 4 MG/2ML IJ SOLN
4.0000 mg | Freq: Once | INTRAMUSCULAR | Status: AC
  Administered 2014-10-20: 4 mg via INTRAVENOUS
  Filled 2014-10-20: qty 2

## 2014-10-20 MED ORDER — FAMOTIDINE 20 MG PO TABS: 20.0000 mg | ORAL_TABLET | Freq: Two times a day (BID) | ORAL | Status: DC

## 2014-10-20 MED ORDER — OMEPRAZOLE 20 MG PO CPDR: 20.0000 mg | DELAYED_RELEASE_CAPSULE | Freq: Every day | ORAL | Status: DC

## 2014-10-20 NOTE — Discharge Instructions (Signed)
Abdominal Pain °Many things can cause abdominal pain. Usually, abdominal pain is not caused by a disease and will improve without treatment. It can often be observed and treated at home. Your health care provider will do a physical exam and possibly order blood tests and X-rays to help determine the seriousness of your pain. However, in many cases, more time must pass before a clear cause of the pain can be found. Before that point, your health care provider may not know if you need more testing or further treatment. °HOME CARE INSTRUCTIONS  °Monitor your abdominal pain for any changes. The following actions may help to alleviate any discomfort you are experiencing: °· Only take over-the-counter or prescription medicines as directed by your health care provider. °· Do not take laxatives unless directed to do so by your health care provider. °· Try a clear liquid diet (broth, tea, or water) as directed by your health care provider. Slowly move to a bland diet as tolerated. °SEEK MEDICAL CARE IF: °· You have unexplained abdominal pain. °· You have abdominal pain associated with nausea or diarrhea. °· You have pain when you urinate or have a bowel movement. °· You experience abdominal pain that wakes you in the night. °· You have abdominal pain that is worsened or improved by eating food. °· You have abdominal pain that is worsened with eating fatty foods. °· You have a fever. °SEEK IMMEDIATE MEDICAL CARE IF:  °· Your pain does not go away within 2 hours. °· You keep throwing up (vomiting). °· Your pain is felt only in portions of the abdomen, such as the right side or the left lower portion of the abdomen. °· You pass bloody or black tarry stools. °MAKE SURE YOU: °· Understand these instructions.   °· Will watch your condition.   °· Will get help right away if you are not doing well or get worse.   °Document Released: 03/03/2005 Document Revised: 05/29/2013 Document Reviewed: 01/31/2013 °[employer]® Patient Information  ©2015 [employer]. This information is not intended to replace advice given to you by your health care provider. Make sure you discuss any questions you have with your health care provider. ° °Gastritis, Adult °Gastritis is soreness and swelling (inflammation) of the lining of the stomach. Gastritis can develop as a sudden onset (acute) or long-term (chronic) condition. If gastritis is not treated, it can lead to stomach bleeding and ulcers. °CAUSES  °Gastritis occurs when the stomach lining is weak or damaged. Digestive juices from the stomach then inflame the weakened stomach lining. The stomach lining may be weak or damaged due to viral or bacterial infections. One common bacterial infection is the Helicobacter pylori infection. Gastritis can also result from excessive alcohol consumption, taking certain medicines, or having too much acid in the stomach.  °SYMPTOMS  °In some cases, there are no symptoms. When symptoms are present, they may include: °· Pain or a burning sensation in the upper abdomen. °· Nausea. °· Vomiting. °· An uncomfortable feeling of fullness after eating. °DIAGNOSIS  °Your caregiver may suspect you have gastritis based on your symptoms and a physical exam. To determine the cause of your gastritis, your caregiver may perform the following: °· Blood or stool tests to check for the H pylori bacterium. °· Gastroscopy. A thin, flexible tube (endoscope) is passed down the esophagus and into the stomach. The endoscope has a light and camera on the end. Your caregiver uses the endoscope to view the inside of the stomach. °· Taking a tissue sample (biopsy)   from the stomach to examine under a microscope. °TREATMENT  °Depending on the cause of your gastritis, medicines may be prescribed. If you have a bacterial infection, such as an H pylori infection, antibiotics may be given. If your gastritis is caused by too much acid in the stomach, H2 blockers or antacids may be given. Your caregiver may  recommend that you stop taking aspirin, ibuprofen, or other nonsteroidal anti-inflammatory drugs (NSAIDs). °HOME CARE INSTRUCTIONS °· Only take over-the-counter or prescription medicines as directed by your caregiver. °· If you were given antibiotic medicines, take them as directed. Finish them even if you start to feel better. °· Drink enough fluids to keep your urine clear or pale yellow. °· Avoid foods and drinks that make your symptoms worse, such as: °¨ Caffeine or alcoholic drinks. °¨ Chocolate. °¨ Peppermint or mint flavorings. °¨ Garlic and onions. °¨ Spicy foods. °¨ Citrus fruits, such as oranges, lemons, or limes. °¨ Tomato-based foods such as sauce, chili, salsa, and pizza. °¨ Fried and fatty foods. °· Eat small, frequent meals instead of large meals. °SEEK IMMEDIATE MEDICAL CARE IF:  °· You have black or dark red stools. °· You vomit blood or material that looks like coffee grounds. °· You are unable to keep fluids down. °· Your abdominal pain gets worse. °· You have a fever. °· You do not feel better after 1 week. °· You have any other questions or concerns. °MAKE SURE YOU: °· Understand these instructions. °· Will watch your condition. °· Will get help right away if you are not doing well or get worse. °Document Released: 05/18/2001 Document Revised: 11/23/2011 Document Reviewed: 07/07/2011 °ExitCare® Patient Information ©2015 [employer]. This information is not intended to replace advice given to you by your health care provider. Make sure you discuss any questions you have with your health care provider. ° °

## 2014-10-20 NOTE — ED Notes (Signed)
Bed: AS34 Expected date:  Expected time:  Means of arrival:  Comments: EMS 49yo M abd pain

## 2014-10-20 NOTE — ED Provider Notes (Signed)
CSN: [id number]     Arrival date & time 10/20/14  0115 History   First MD Initiated Contact with Patient 10/20/14 0145     Chief Complaint  Patient presents with  . Abdominal Pain     (Consider location/radiation/quality/duration/timing/severity/associated sxs/prior Treatment) Patient is a 49 y.o. male presenting with abdominal pain. The history is provided by the patient. No language interpreter was used.  Abdominal Pain Pain location:  Epigastric Pain quality: sharp   Pain radiates to:  Does not radiate Pain severity:  Severe Associated symptoms: nausea and vomiting   Associated symptoms: no chills and no fever   Associated symptoms comment:  Sharp, constant epigastric pain for the past week. No fever. He has had nausea and vomiting. No diarrhea. Seen here for same on 10/16/14 and reports no change to symptoms.    Past Medical History  Diagnosis Date  . Chest pain, mid sternal   . CAD in native artery     a. Nonobstructive cath 11/2007;  b. Presented with ST elevation - Nonobstructive cath 08/2011  . GERD (gastroesophageal reflux disease)   . Tobacco abuse   . Marijuana abuse     a. uses ~ 1x /wk or less  . History of cocaine abuse     a. quit ? 2009  . History of ETOH abuse     a. drinks 2 "40's" / wk  . Bradycardia     a. asymptomatic  . Bipolar disorder   . Syncope     a. 12/2010 - presumed to be vasovagal  . Abnormal ECG     a. early repolarization  . Coronary artery disease   . Pneumonia   . Stomach ulcer   . Arthritis     "my whole left side" (05/03/2014)   Past Surgical History  Procedure Laterality Date  . Cardiac catheterization  2009; 08/2011    [name] 08/06/2011  . Left heart catheterization with coronary angiogram N/A 08/16/2011    Procedure: LEFT HEART CATHETERIZATION WITH CORONARY ANGIOGRAM;  Surgeon: [name], MD;  Location: [facility] CATH LAB;  Service: Cardiovascular;  Laterality: N/A;   History reviewed. No pertinent family history. History   Substance Use Topics  . Smoking status: Current Every Day Smoker -- 0.50 packs/day for 20 years    Types: Cigarettes  . Smokeless tobacco: Never Used  . Alcohol Use: 28.8 oz/week    48 Cans of beer per week     Comment: 05/03/2014 "2 40oz beers/day"    Review of Systems  Constitutional: Negative for fever and chills.  HENT: Negative.   Respiratory: Negative.   Cardiovascular: Negative.   Gastrointestinal: Positive for nausea, vomiting and abdominal pain.  Musculoskeletal: Negative.   Skin: Negative.   Neurological: Negative.       Allergies  Aspirin; Pepperoni; Tomato; and Tylenol  Home Medications   Prior to Admission medications   Medication Sig Start Date End Date Taking? Authorizing Provider  pantoprazole (PROTONIX) 40 MG tablet Take 1 tablet (40 mg total) by mouth daily. Patient not taking: Reported on 06/05/2014 05/04/14   Riccardo Dubin, MD  QUEtiapine (SEROQUEL) 200 MG tablet Take 1 tablet (200 mg total) by mouth at bedtime. Patient not taking: Reported on 05/03/2014 04/23/14   Shuvon B Rankin, NP  traMADol (ULTRAM) 50 MG tablet Take 1 tablet (50 mg total) by mouth every 6 (six) hours as needed. Patient not taking: Reported on 06/05/2014 27/25/36   Delora Fuel, MD   BP 644/03 mmHg  Pulse 76  Temp(Src) 98.4 F (36.9 C) (Oral)  Resp 18  SpO2 100% Physical Exam  Constitutional: He is oriented to person, place, and time. He appears well-developed and well-nourished.  HENT:  Head: Normocephalic.  Neck: Normal range of motion. Neck supple.  Cardiovascular: Normal rate and regular rhythm.   Pulmonary/Chest: Effort normal and breath sounds normal. He has no wheezes. He has no rales.  Abdominal: Soft. There is tenderness. There is no rebound and no guarding.  Tenderness limited to epigastrium.   Musculoskeletal: Normal range of motion.  Neurological: He is alert and oriented to person, place, and time.  Skin: Skin is warm and dry. No rash noted.  Psychiatric: He  has a normal mood and affect.    ED Course  Procedures (including critical care time) Labs Review Labs Reviewed  URINALYSIS, ROUTINE W REFLEX MICROSCOPIC - Abnormal; Notable for the following:    Specific Gravity, Urine 1.003 (*)    All other components within normal limits  CBC WITH DIFFERENTIAL/PLATELET  COMPREHENSIVE METABOLIC PANEL  LIPASE, BLOOD  I-STAT TROPOININ, ED    Imaging Review No results found.   EKG Interpretation   Date/Time:  Sunday Oct 20 2014 01:24:15 EDT Ventricular Rate:  59 PR Interval:  179 QRS Duration: 88 QT Interval:  398 QTC Calculation: 394 R Axis:   54 Text Interpretation:  Sinus rhythm RSR' in V1 or V2, probably normal  variant ST elev, probable normal early repol pattern No significant change  since last tracing Confirmed by OTTER  MD, OLGA ([id number]) on 10/20/2014  2:16:33 AM      MDM   Final diagnoses:  None    1. Epigastric pain  Pain is resolved with GI cocktail and Pepcid. He is resting comfortably. Chart reviewed. He has a history of 'stomache ulcer' but does not remember the name of his GI doctor. Will refer to both PCP (Dr. Sheryle Hail) and GI [facility]) for further outpatient management of recurrent epigastric pain. He is strongly encouraged to avoid alcohol use. VSS. He is felt ready for discharge home.     Charlann Lange, PA-C 10/20/14 0320  Linton Flemings, MD 10/20/14 [phone]

## 2014-10-20 NOTE — ED Notes (Signed)
Brought in by EMS with c/o abdominal pain.  Pt reports that he has been having abdominal pain for a couple of days, pain is getting progressively worse.  Pt also has had nausea and vomiting.  Pt presents to ED moaning and groaning in pain.

## 2015-01-17 DIAGNOSIS — Z8739 Personal history of other diseases of the musculoskeletal system and connective tissue: Secondary | ICD-10-CM | POA: Insufficient documentation

## 2015-01-17 DIAGNOSIS — Z79899 Other long term (current) drug therapy: Secondary | ICD-10-CM | POA: Insufficient documentation

## 2015-01-17 DIAGNOSIS — I2510 Atherosclerotic heart disease of native coronary artery without angina pectoris: Secondary | ICD-10-CM | POA: Insufficient documentation

## 2015-01-17 DIAGNOSIS — Z8701 Personal history of pneumonia (recurrent): Secondary | ICD-10-CM | POA: Insufficient documentation

## 2015-01-17 DIAGNOSIS — F319 Bipolar disorder, unspecified: Secondary | ICD-10-CM | POA: Diagnosis not present

## 2015-01-17 DIAGNOSIS — Z9889 Other specified postprocedural states: Secondary | ICD-10-CM | POA: Diagnosis not present

## 2015-01-17 DIAGNOSIS — Z008 Encounter for other general examination: Secondary | ICD-10-CM | POA: Diagnosis present

## 2015-01-17 DIAGNOSIS — Z720 Tobacco use: Secondary | ICD-10-CM | POA: Insufficient documentation

## 2015-01-17 DIAGNOSIS — K219 Gastro-esophageal reflux disease without esophagitis: Secondary | ICD-10-CM | POA: Insufficient documentation

## 2015-01-17 DIAGNOSIS — F1210 Cannabis abuse, uncomplicated: Secondary | ICD-10-CM | POA: Insufficient documentation

## 2015-01-18 ENCOUNTER — Emergency Department (HOSPITAL_COMMUNITY)
Admission: EM | Admit: 2015-01-18 | Discharge: 2015-01-18 | Disposition: A | Discharge status: Other Acute Inpatient Hospital | Payer: Medicaid Other | Attending: Emergency Medicine | Admitting: Emergency Medicine

## 2015-01-18 ENCOUNTER — Encounter (HOSPITAL_COMMUNITY): Payer: Self-pay | Admitting: Emergency Medicine

## 2015-01-18 ENCOUNTER — Encounter (HOSPITAL_COMMUNITY): Payer: Self-pay | Admitting: *Deleted

## 2015-01-18 ENCOUNTER — Inpatient Hospital Stay (HOSPITAL_COMMUNITY)
Admission: AD | Admit: 2015-01-18 | Discharge: 2015-01-27 | DRG: 897 | Disposition: A | Discharge status: Home / Self Care | Payer: Medicaid Other | Source: Intra-hospital | Attending: Psychiatry | Admitting: Psychiatry

## 2015-01-18 DIAGNOSIS — F1210 Cannabis abuse, uncomplicated: Secondary | ICD-10-CM | POA: Diagnosis present

## 2015-01-18 DIAGNOSIS — F1024 Alcohol dependence with alcohol-induced mood disorder: Secondary | ICD-10-CM | POA: Diagnosis present

## 2015-01-18 DIAGNOSIS — G4700 Insomnia, unspecified: Secondary | ICD-10-CM | POA: Diagnosis present

## 2015-01-18 DIAGNOSIS — Y906 Blood alcohol level of 120-199 mg/100 ml: Secondary | ICD-10-CM | POA: Diagnosis present

## 2015-01-18 DIAGNOSIS — F419 Anxiety disorder, unspecified: Secondary | ICD-10-CM | POA: Diagnosis present

## 2015-01-18 DIAGNOSIS — I2510 Atherosclerotic heart disease of native coronary artery without angina pectoris: Secondary | ICD-10-CM | POA: Diagnosis present

## 2015-01-18 DIAGNOSIS — F17210 Nicotine dependence, cigarettes, uncomplicated: Secondary | ICD-10-CM | POA: Diagnosis present

## 2015-01-18 DIAGNOSIS — R45851 Suicidal ideations: Secondary | ICD-10-CM | POA: Diagnosis present

## 2015-01-18 DIAGNOSIS — F1020 Alcohol dependence, uncomplicated: Secondary | ICD-10-CM | POA: Diagnosis not present

## 2015-01-18 DIAGNOSIS — Z9889 Other specified postprocedural states: Secondary | ICD-10-CM | POA: Diagnosis not present

## 2015-01-18 DIAGNOSIS — F10229 Alcohol dependence with intoxication, unspecified: Secondary | ICD-10-CM | POA: Diagnosis present

## 2015-01-18 DIAGNOSIS — Z888 Allergy status to other drugs, medicaments and biological substances status: Secondary | ICD-10-CM | POA: Diagnosis not present

## 2015-01-18 DIAGNOSIS — F329 Major depressive disorder, single episode, unspecified: Secondary | ICD-10-CM | POA: Diagnosis not present

## 2015-01-18 DIAGNOSIS — Z79899 Other long term (current) drug therapy: Secondary | ICD-10-CM

## 2015-01-18 DIAGNOSIS — F319 Bipolar disorder, unspecified: Secondary | ICD-10-CM | POA: Diagnosis present

## 2015-01-18 LAB — ETHANOL: ALCOHOL ETHYL (B): 193 mg/dL — AB (ref <5)

## 2015-01-18 LAB — CBC WITH DIFFERENTIAL/PLATELET
BASOS ABS: 0 10*3/uL (ref 0.0–0.1)
BASOS PCT: 0 % (ref 0–1)
Eosinophils Absolute: 0.1 10*3/uL (ref 0.0–0.7)
Eosinophils Relative: 1 % (ref 0–5)
HCT: 45.3 % (ref 39.0–52.0)
Hemoglobin: 15.7 g/dL (ref 13.0–17.0)
Lymphocytes Relative: 25 % (ref 12–46)
Lymphs Abs: 1.7 10*3/uL (ref 0.7–4.0)
MCH: 31.5 pg (ref 26.0–34.0)
MCHC: 34.7 g/dL (ref 30.0–36.0)
MCV: 91 fL (ref 78.0–100.0)
Monocytes Absolute: 0.3 10*3/uL (ref 0.1–1.0)
Monocytes Relative: 5 % (ref 3–12)
NEUTROS PCT: 69 % (ref 43–77)
Neutro Abs: 4.7 10*3/uL (ref 1.7–7.7)
PLATELETS: 219 10*3/uL (ref 150–400)
RBC: 4.98 MIL/uL (ref 4.22–5.81)
RDW: 14.2 % (ref 11.5–15.5)
WBC: 6.8 10*3/uL (ref 4.0–10.5)

## 2015-01-18 LAB — RAPID URINE DRUG SCREEN, HOSP PERFORMED
AMPHETAMINES: NOT DETECTED
BARBITURATES: NOT DETECTED
BENZODIAZEPINES: NOT DETECTED
Cocaine: NOT DETECTED
OPIATES: NOT DETECTED
TETRAHYDROCANNABINOL: POSITIVE — AB

## 2015-01-18 LAB — I-STAT CHEM 8, ED
BUN: 5 mg/dL — ABNORMAL LOW (ref 6–20)
CALCIUM ION: 1.14 mmol/L (ref 1.12–1.23)
CHLORIDE: 108 mmol/L (ref 101–111)
CREATININE: 1.3 mg/dL — AB (ref 0.61–1.24)
Glucose, Bld: 85 mg/dL (ref 65–99)
HCT: 52 % (ref 39.0–52.0)
Hemoglobin: 17.7 g/dL — ABNORMAL HIGH (ref 13.0–17.0)
POTASSIUM: 4.1 mmol/L (ref 3.5–5.1)
SODIUM: 141 mmol/L (ref 135–145)
TCO2: 21 mmol/L (ref 0–100)

## 2015-01-18 MED ORDER — ONDANSETRON HCL 4 MG PO TABS: 4.0000 mg | ORAL_TABLET | Freq: Three times a day (TID) | ORAL | Status: DC | PRN

## 2015-01-18 MED ORDER — PANTOPRAZOLE SODIUM 40 MG PO TBEC
40.0000 mg | DELAYED_RELEASE_TABLET | Freq: Every day | ORAL | Status: DC
  Administered 2015-01-18 – 2015-01-26 (×9): 40 mg via ORAL
  Filled 2015-01-18 (×11): qty 1

## 2015-01-18 MED ORDER — VITAMIN B-1 100 MG PO TABS
100.0000 mg | ORAL_TABLET | Freq: Every day | ORAL | Status: DC
  Administered 2015-01-19 – 2015-01-26 (×8): 100 mg via ORAL
  Filled 2015-01-18 (×10): qty 1

## 2015-01-18 MED ORDER — FAMOTIDINE 20 MG PO TABS: 20.0000 mg | ORAL_TABLET | Freq: Two times a day (BID) | ORAL | Status: DC

## 2015-01-18 MED ORDER — THIAMINE HCL 100 MG/ML IJ SOLN
100.0000 mg | Freq: Once | INTRAMUSCULAR | Status: AC
  Administered 2015-01-18: 100 mg via INTRAMUSCULAR
  Filled 2015-01-18: qty 2

## 2015-01-18 MED ORDER — ZOLPIDEM TARTRATE 5 MG PO TABS
5.0000 mg | ORAL_TABLET | Freq: Every evening | ORAL | Status: DC | PRN
  Administered 2015-01-18: 5 mg via ORAL
  Filled 2015-01-18: qty 1

## 2015-01-18 MED ORDER — ADULT MULTIVITAMIN W/MINERALS CH
1.0000 | ORAL_TABLET | Freq: Every day | ORAL | Status: DC
  Administered 2015-01-18 – 2015-01-26 (×9): 1 via ORAL
  Filled 2015-01-18 (×11): qty 1

## 2015-01-18 MED ORDER — CHLORDIAZEPOXIDE HCL 25 MG PO CAPS: 25.0000 mg | ORAL_CAPSULE | Freq: Four times a day (QID) | ORAL | Status: AC | PRN

## 2015-01-18 MED ORDER — MAGNESIUM HYDROXIDE 400 MG/5ML PO SUSP: 30.0000 mL | Freq: Every day | ORAL | Status: DC | PRN

## 2015-01-18 MED ORDER — CHLORDIAZEPOXIDE HCL 25 MG PO CAPS
25.0000 mg | ORAL_CAPSULE | ORAL | Status: AC
  Administered 2015-01-20 – 2015-01-21 (×2): 25 mg via ORAL
  Filled 2015-01-18 (×2): qty 1

## 2015-01-18 MED ORDER — ONDANSETRON 4 MG PO TBDP: 4.0000 mg | ORAL_TABLET | Freq: Four times a day (QID) | ORAL | Status: AC | PRN

## 2015-01-18 MED ORDER — CHLORDIAZEPOXIDE HCL 25 MG PO CAPS
25.0000 mg | ORAL_CAPSULE | Freq: Three times a day (TID) | ORAL | Status: AC
  Administered 2015-01-19 – 2015-01-20 (×3): 25 mg via ORAL
  Filled 2015-01-18 (×3): qty 1

## 2015-01-18 MED ORDER — NICOTINE 21 MG/24HR TD PT24: 21.0000 mg | MEDICATED_PATCH | Freq: Every day | TRANSDERMAL | Status: DC

## 2015-01-18 MED ORDER — ALUM & MAG HYDROXIDE-SIMETH 200-200-20 MG/5ML PO SUSP: 30.0000 mL | ORAL | Status: DC | PRN

## 2015-01-18 MED ORDER — LORAZEPAM 1 MG PO TABS
1.0000 mg | ORAL_TABLET | Freq: Three times a day (TID) | ORAL | Status: DC | PRN
Start: 2015-01-18 — End: 2015-01-18
  Administered 2015-01-18: 1 mg via ORAL
  Filled 2015-01-18: qty 1

## 2015-01-18 MED ORDER — CHLORDIAZEPOXIDE HCL 25 MG PO CAPS
25.0000 mg | ORAL_CAPSULE | Freq: Every day | ORAL | Status: AC
  Administered 2015-01-22: 25 mg via ORAL
  Filled 2015-01-18: qty 1

## 2015-01-18 MED ORDER — QUETIAPINE FUMARATE 200 MG PO TABS
200.0000 mg | ORAL_TABLET | Freq: Every day | ORAL | Status: DC
  Administered 2015-01-18: 200 mg via ORAL
  Filled 2015-01-18 (×3): qty 1

## 2015-01-18 MED ORDER — HYDROXYZINE HCL 25 MG PO TABS
25.0000 mg | ORAL_TABLET | Freq: Four times a day (QID) | ORAL | Status: AC | PRN
  Administered 2015-01-18: 25 mg via ORAL
  Filled 2015-01-18: qty 1

## 2015-01-18 MED ORDER — LOPERAMIDE HCL 2 MG PO CAPS: 2.0000 mg | ORAL_CAPSULE | ORAL | Status: AC | PRN

## 2015-01-18 MED ORDER — CHLORDIAZEPOXIDE HCL 25 MG PO CAPS
25.0000 mg | ORAL_CAPSULE | Freq: Four times a day (QID) | ORAL | Status: AC
  Administered 2015-01-18 – 2015-01-19 (×4): 25 mg via ORAL
  Filled 2015-01-18 (×4): qty 1

## 2015-01-18 NOTE — Progress Notes (Signed)
Patient ID: [name] [name], male   DOB: 28-Apr-1966, 49 y.o.   MRN: [id number]   49 year old black male admitted after he presented to [facility] reporting HI towards anyone. Pt reported that recently his fiance kicked him out and let her ex-husband come back. Pt reported that last year his fiance had a stroke which left her paralyzed from waist down, and every since then he noticed a change. Pt reported that he did not think that he would put him out, but she did. Pt reported that he was hurt, and that he just wanted to throw a gas bomb on someone. Pt reported that he knew that he needed help, because he was having thoughts of hurting someone but knew it was wrong. Pt reported that he has been drinking 3 forties three times a week and that he used crack/cociane yesterday. Pt requested a SW consult due to not knowing where he can go after discharge. Pt reported being negative SI, and positive HI, but was able to contract for safety. Pt reported being negative AH/VH noted.

## 2015-01-18 NOTE — Tx Team (Addendum)
Initial Interdisciplinary Treatment Plan   PATIENT STRESSORS: Marital or family conflict Medication change or noncompliance   PATIENT STRENGTHS: Ability for insight General fund of knowledge   PROBLEM LIST: Problem List/Patient Goals Date to be addressed Date deferred Reason deferred Estimated date of resolution  Depression 01/18/15     "stress"      Substance abuse 01/22/2015     Suicidal ideation (on admission)                                     DISCHARGE CRITERIA:  Improved stabilization in mood, thinking, and/or behavior  PRELIMINARY DISCHARGE PLAN: Placement in alternative living arrangements  PATIENT/FAMIILY INVOLVEMENT: This treatment plan has been presented to and reviewed with the patient, DELPHIN FUNES, and/or family member.  The patient and family have been given the opportunity to ask questions and make suggestions.  Benancio Deeds Shanta 01/18/2015, 11:15 AM

## 2015-01-18 NOTE — BH Assessment (Addendum)
Tele Assessment Note   [name] [name] is an 49 y.o. male. With known hx of Bipolar disorder, and polysubstance abuse reporting to ED reporting SI and HI. Pt reports he is having conflict with his fiance, and is afraid his fiance's husband has been following him around and wants to kill him. He reports he has been with SO for ten years. Pt reports he feels like he is going to hurt himself or someone else and came to ED to get help before he acted on these thoughts. Pt reports SI for months that got worse today.He reports he plans to jump from one of the many nearby bridges. Pt reports he feels like he might have to kill his fiance's husband [name] before [name] kills him. Pt reports he could gain access to a gun if he wanted to. Pt reports he does not want to hurt anywhere, that is why he came to ED to get help. Pt is alert an oriented times 3, he does not know the date, at time of assessment. Pt reports depressed and anxious mood with appropriate affect. Speech is logical and coherent, with halting pace that stops and starts slowly, with long pauses. Pt reports SI, HI, and SA. He denies hx of self harm. He reports attempting suicide twice in the past, once by taking pills and once walking in traffic. Pt reports he can see happiness, but is not happy and does not how to get to the happiness he can see. He denies  AVH.   Pt reports he has had depression for years and that it is getting worse. He reports hopelessness, helplessness, loss of pleasure, loss of motivation, irritability, not eating or sleeping well and SI with planning. Pt reports hx of manic behaviors.   Pt reports anxiety and feeling nervous often. He reports frequent panic attacks, which he related to recent stressors with SO, and having a family member died recently. Pt denies hx of physical or sexual abuse, but notes people have often spoken to him abusively.   Pt reports he is drinking more recently but is unable to supply details. He reports  hx of using THC and cocaine. Cocaine twice this month , and THC about every other day.   Pt reports family hx is unknown to him because he has not spoken with family is a very long time.   Pt is unable to contract for safety towards self and others.   Axis I:  296.53 Bipolar I Disorder, most recent episode depressed severe without psychotic features  303.90 Alcohol Use Disorder, severe  304.30 Cannabis Use Disorder, Moderate  304.20 Cocaine Use Disorder, moderate   Past Medical History:  Past Medical History  Diagnosis Date  . Chest pain, mid sternal   . CAD in native artery     a. Nonobstructive cath 11/2007;  b. Presented with ST elevation - Nonobstructive cath 08/2011  . GERD (gastroesophageal reflux disease)   . Tobacco abuse   . Marijuana abuse     a. uses ~ 1x /wk or less  . History of cocaine abuse     a. quit ? 2009  . History of ETOH abuse     a. drinks 2 "40's" / wk  . Bradycardia     a. asymptomatic  . Bipolar disorder   . Syncope     a. 12/2010 - presumed to be vasovagal  . Abnormal ECG     a. early repolarization  . Coronary artery disease   .  Pneumonia   . Stomach ulcer   . Arthritis     "my whole left side" (05/03/2014)    Past Surgical History  Procedure Laterality Date  . Cardiac catheterization  2009; 08/2011    [name] 08/06/2011  . Left heart catheterization with coronary angiogram N/A 08/16/2011    Procedure: LEFT HEART CATHETERIZATION WITH CORONARY ANGIOGRAM;  Surgeon: [name], MD;  Location: [facility] CATH LAB;  Service: Cardiovascular;  Laterality: N/A;    Family History: No family history on file.  Social History:  reports that he has been smoking Cigarettes.  He has a 10 pack-year smoking history. He has never used smokeless tobacco. He reports that he drinks about 28.8 oz of alcohol per week. He reports that he uses illicit drugs (Marijuana and Cocaine).  Additional Social History:  Alcohol / Drug Use Pain Medications: denies  abuse Prescriptions: See PTA, reports he has been off all of his medications, because he never signed up for OP services Over the Counter: See PTA History of alcohol / drug use?: Yes Longest period of sobriety (when/how long): unknown, denies hx of seizures related to alcohol withdrawal  Negative Consequences of Use: Personal relationships, Legal Withdrawal Symptoms:  (none reported at this time ) Substance #1 Name of Substance 1: THC 1 - Age of First Use: 14 1 - Amount (size/oz): varies 1 - Frequency: every other day for the last week  1 - Duration: on and off for years  1 - Last Use / Amount: 01-17-15 Substance #2 Name of Substance 2: crack cocaine  2 - Age of First Use: in the 69s after his 50 month old child died 2 - Amount (size/oz): unknown  2 - Frequency: reports twice this month 2 - Duration: on and off for years  2 - Last Use / Amount: unknown Substance #3 Name of Substance 3: etoh  3 - Age of First Use: teens 3 - Amount (size/oz): unknown, reports drinking mostly beer and liqour presently  3 - Frequency: daily  3 - Duration: few months at current level  3 - Last Use / Amount: uncertain, BAL pending   CIWA: CIWA-Ar BP: 125/82 mmHg Pulse Rate: 88 COWS:    PATIENT STRENGTHS: (choose at least two) Communication skills Requesting Help, reports desire to get better and not hurt himself or others  Allergies:  Allergies  Allergen Reactions  . Aspirin     Aggravates ulcer. "Causes chest pain."  . Pepperoni [Pickled Meat] Other (See Comments)    aggrevates ulcer  . Tomato Other (See Comments)    Foods with tomato sauce aggrevate ulcers  . Tylenol [Acetaminophen]     Pt reports hx of ulcers    Home Medications:  (Not in a hospital admission)  OB/GYN Status:  No LMP for male patient.  General Assessment Data [facility] of Assessment: [facility] TTS Assessment: In system Is this a Tele or Face-to-Face Assessment?: Tele Assessment Is this an Initial Assessment or a  Re-assessment for this encounter?: Initial Assessment Marital status: Single Is patient pregnant?: No Pregnancy Status: No Living Arrangements: Alone (reports from place to place ) Can pt return to current living arrangement?: Yes Admission Status: Voluntary Is patient capable of signing voluntary admission?: Yes Referral Source: Self/Family/Friend Insurance type: MCD     Crisis Care Plan Living Arrangements: Alone (reports from place to place ) Name of Psychiatrist: none at present Name of Therapist: none  Education Status Is patient currently in school?: No Current Grade: NA Highest grade of  school patient has completed: 10 "with special assistance" per pt Name of school: NA Contact person: NA  Risk to self with the past 6 months Suicidal Ideation: Yes-Currently Present Has patient been a risk to self within the past 6 months prior to admission? : Yes Suicidal Intent: Yes-Currently Present Has patient had any suicidal intent within the past 6 months prior to admission? : Yes Is patient at risk for suicide?: Yes Suicidal Plan?: Yes-Currently Present Has patient had any suicidal plan within the past 6 months prior to admission? : Yes Specify Current Suicidal Plan: pt reports "there are so many bridges around, just find one and jump offf. " Access to Means: Yes Specify Access to Suicidal Means: bridges What has been your use of drugs/alcohol within the last 12 months?: Pt reports he has been abusing etoh, THC, and crack cocaine  Previous Attempts/Gestures: Yes How many times?: 2 Other Self Harm Risks: none Triggers for Past Attempts: Family contact, Spouse contact Intentional Self Injurious Behavior: None Family Suicide History: No Recent stressful life event(s): Conflict (Comment) (reports SO's husband wants to kill him ) Persecutory voices/beliefs?: Yes Depression: Yes Depression Symptoms: Despondent, Insomnia, Tearfulness, Isolating, Fatigue, Guilt, Feeling  worthless/self pity, Loss of interest in usual pleasures, Feeling angry/irritable Substance abuse history and/or treatment for substance abuse?: Yes Suicide prevention information given to non-admitted patients: Not applicable  Risk to Others within the past 6 months Homicidal Ideation: Yes-Currently Present Does patient have any lifetime risk of violence toward others beyond the six months prior to admission? : Yes (comment) Thoughts of Harm to Others: Yes-Currently Present Comment - Thoughts of Harm to Others: reports came to hospital to get help before he hurts [name]  Current Homicidal Intent: Yes-Currently Present Current Homicidal Plan: Yes-Currently Present Describe Current Homicidal Plan: reports he could get gun if he wanted it, states he feels he needs to kill [name] before [place] kills him  Access to Homicidal Means: Yes Describe Access to Homicidal Means: reports knows people who could get him a gun  Identified Victim: [name] History of harm to others?: Yes Assessment of Violence: In past 6-12 months Violent Behavior Description: pt reports he is on probation for assault, but denies hx  of violence towards others Does patient have access to weapons?: Yes (Comment) Criminal Charges Pending?: Yes Describe Pending Criminal Charges: assualt Does patient have a court date: Yes Court Date:  (pt not sure of court date "maybe middle of next month") Is patient on probation?: Yes  Psychosis Hallucinations: None noted Delusions: Persecutory  Mental Status Report Appearance/Hygiene: Unremarkable Eye Contact: Fair Motor Activity: Rigidity Speech: Logical/coherent Level of Consciousness: Alert Mood: Depressed, Anxious Affect: Appropriate to circumstance Anxiety Level: Severe Thought Processes: Coherent, Relevant Judgement: Impaired Orientation: Person, Place, Time, Situation Obsessive Compulsive Thoughts/Behaviors: None  Cognitive Functioning Concentration:  Normal Memory: Recent Intact, Remote Intact IQ: Average Insight: Fair Impulse Control: Poor Appetite: Poor Weight Loss:  (pt unsure how much ) Weight Gain: 0 Sleep: Decreased Total Hours of Sleep:  ("very little") Vegetative Symptoms: None  ADLScreening [facility] Assessment Services) Patient's cognitive ability adequate to safely complete daily activities?: Yes Patient able to express need for assistance with ADLs?: Yes Independently performs ADLs?: Yes (appropriate for developmental age)  Prior Inpatient Therapy Prior Inpatient Therapy: Yes Prior Therapy Dates: multiple Prior Therapy Facilty/Provider(s): [facility] multiple times, [facility], [place], [place]  Reason for Treatment: bipolar SI  Prior Outpatient Therapy Prior Outpatient Therapy: No Prior Therapy Dates: NA Prior Therapy Facilty/Provider(s): NA Reason  for Treatment: NA Does patient have an ACCT team?: No Does patient have Intensive In-House Services?  : No Does patient have [facility] services? : No Does patient have P4CC services?: No  ADL Screening (condition at time of admission) Patient's cognitive ability adequate to safely complete daily activities?: Yes Is the patient deaf or have difficulty hearing?: No Does the patient have difficulty seeing, even when wearing glasses/contacts?: No Does the patient have difficulty concentrating, remembering, or making decisions?: Yes Patient able to express need for assistance with ADLs?: Yes Does the patient have difficulty dressing or bathing?: No Independently performs ADLs?: Yes (appropriate for developmental age) Does the patient have difficulty walking or climbing stairs?: No Weakness of Legs: None Weakness of Arms/Hands: None  Home Assistive Devices/Equipment Home Assistive Devices/Equipment: None    Abuse/Neglect Assessment (Assessment to be complete while patient is alone) Physical Abuse: Denies Verbal Abuse: Yes, past (Comment) (reports people have spoken poorly  to him many times) Sexual Abuse: Denies Exploitation of patient/patient's resources: Denies Self-Neglect: Denies Values / Beliefs Cultural Requests During Hospitalization: None Spiritual Requests During Hospitalization: None   Advance Directives (For Healthcare) Does patient have an advance directive?: No Would patient like information on creating an advanced directive?: No - patient declined information Nutrition Screen- [facility] Patient's home diet: Regular Has the patient recently lost weight without trying?: Patient is unsure Has the patient been eating poorly because of a decreased appetite?: Yes Malnutrition Screening Tool Score: 3  Additional Information 1:1 In Past 12 Months?: No CIRT Risk: No Elopement Risk: No Does patient have medical clearance?: No     Disposition:  Per Arlester Marker NP pt meets inpt criteria and can be accepted to [facility] either 300 or 400 hall. Per Lavell Luster [facility] pt can be placed in room 307-1 under the care of Dr. Sabra Heck, to arrive later in AM on 01-18-15 [place] be called to [id number], arrival time to be coordinated with adult unit.   Informed Ignatius Specking, of recommendations.   Informed Baker Janus PA of recommendations and she is in agreement.    Lear Ng, [facility] Triage Specialist 01/18/2015 1:50 AM  Disposition Initial Assessment Completed for this Encounter: Yes  Lilo Wallington M 01/18/2015 1:48 AM

## 2015-01-18 NOTE — ED Provider Notes (Signed)
CSN: [id number]     Arrival date & time 01/17/15  2346 History   First MD Initiated Contact with Patient 01/18/15 0021     Chief Complaint  Patient presents with  . Medical Clearance     (Consider location/radiation/quality/duration/timing/severity/associated sxs/prior Treatment) HPI Comments: Patient has a psychiatric history.  He states he has not been taking any medication in several years, but tonight his suicide is very angry.  He wants to do something.  He has no specific plan  The history is provided by the patient.    Past Medical History  Diagnosis Date  . Chest pain, mid sternal   . CAD in native artery     a. Nonobstructive cath 11/2007;  b. Presented with ST elevation - Nonobstructive cath 08/2011  . GERD (gastroesophageal reflux disease)   . Tobacco abuse   . Marijuana abuse     a. uses ~ 1x /wk or less  . History of cocaine abuse     a. quit ? 2009  . History of ETOH abuse     a. drinks 2 "40's" / wk  . Bradycardia     a. asymptomatic  . Bipolar disorder   . Syncope     a. 12/2010 - presumed to be vasovagal  . Abnormal ECG     a. early repolarization  . Coronary artery disease   . Pneumonia   . Stomach ulcer   . Arthritis     "my whole left side" (05/03/2014)   Past Surgical History  Procedure Laterality Date  . Cardiac catheterization  2009; 08/2011    [name] 08/06/2011  . Left heart catheterization with coronary angiogram N/A 08/16/2011    Procedure: LEFT HEART CATHETERIZATION WITH CORONARY ANGIOGRAM;  Surgeon: [name], MD;  Location: [facility] CATH LAB;  Service: Cardiovascular;  Laterality: N/A;   No family history on file. Social History  Substance Use Topics  . Smoking status: Current Every Day Smoker -- 0.50 packs/day for 20 years    Types: Cigarettes  . Smokeless tobacco: Never Used  . Alcohol Use: 28.8 oz/week    48 Cans of beer per week     Comment: 05/03/2014 "2 40oz beers/day"    Review of Systems  Constitutional: Negative for fever.   Respiratory: Negative for shortness of breath.   Neurological: Negative for headaches.  Psychiatric/Behavioral: Positive for suicidal ideas.  All other systems reviewed and are negative.     Allergies  Aspirin; Pepperoni; Tomato; and Tylenol  Home Medications   Prior to Admission medications   Medication Sig Start Date End Date Taking? Authorizing Provider  famotidine (PEPCID) 20 MG tablet Take 1 tablet (20 mg total) by mouth 2 (two) times daily. 10/20/14   Charlann Lange, PA-C  omeprazole (PRILOSEC) 20 MG capsule Take 1 capsule (20 mg total) by mouth daily. 10/20/14   Charlann Lange, PA-C  pantoprazole (PROTONIX) 40 MG tablet Take 1 tablet (40 mg total) by mouth daily. Patient not taking: Reported on 06/05/2014 05/04/14   Riccardo Dubin, MD  QUEtiapine (SEROQUEL) 200 MG tablet Take 1 tablet (200 mg total) by mouth at bedtime. Patient not taking: Reported on 05/03/2014 04/23/14   Shuvon B Rankin, NP  traMADol (ULTRAM) 50 MG tablet Take 1 tablet (50 mg total) by mouth every 6 (six) hours as needed. Patient not taking: Reported on 06/05/2014 67/34/19   Delora Fuel, MD   BP 379/02 mmHg  Pulse 88  Temp(Src) 98.4 F (36.9 C) (Oral)  Resp 20  SpO2  99% Physical Exam  Constitutional: He is oriented to person, place, and time. He appears well-developed and well-nourished.  HENT:  Head: Normocephalic.  Eyes: Pupils are equal, round, and reactive to light.  Neck: Normal range of motion.  Cardiovascular: Normal rate.   Musculoskeletal: Normal range of motion.  Neurological: He is alert and oriented to person, place, and time.  Psychiatric: His speech is normal and behavior is normal. His affect is angry. He expresses impulsivity. He expresses suicidal ideation. He expresses no suicidal plans. He exhibits normal recent memory and normal remote memory.  Nursing note and vitals reviewed.   ED Course  Procedures (including critical care time) Labs Review Labs Reviewed  ETHANOL -  Abnormal; Notable for the following:    Alcohol, Ethyl (B) 193 (*)    All other components within normal limits  URINE RAPID DRUG SCREEN, HOSP PERFORMED - Abnormal; Notable for the following:    Tetrahydrocannabinol POSITIVE (*)    All other components within normal limits  I-STAT CHEM 8, ED - Abnormal; Notable for the following:    BUN 5 (*)    Creatinine, Ser 1.30 (*)    Hemoglobin 17.7 (*)    All other components within normal limits  CBC WITH DIFFERENTIAL/PLATELET    Imaging Review No results found. I, Deryn Massengale K, personally reviewed and evaluated these images and lab results as part of my medical decision-making.   EKG Interpretation None     Patient states he's had numerous suicide attempts in the past including overdosing with pills, clubbing a front of cars.  Has not done any of this in several years.  He has not taken his medicine in several years.  He states he used to go to comfort center but has not been there in several years Patient has been evaluated by TTS accepted to [facility] hospital bed will be available in the morning MDM   Final diagnoses:  Suicidal ideation        Junius Creamer, NP 01/18/15 [place], MD 01/18/15 [phone]

## 2015-01-18 NOTE — ED Notes (Signed)
Pt from home and is Voluntary. He denies SI but reports HI. He plans to throw a gas bomb in someone's window. He reports he is depressed. Flat affect triage.

## 2015-01-18 NOTE — Progress Notes (Signed)
D: Pt denies SI/HI/AVH . Pt is pleasant and cooperative. Pt stayed in bed duration of the evening. Pt stated he was going through a lot of stress and needed to get himself together. Pt said he was tired and needed to get some rest.   A: Pt was offered support and encouragement. Pt was given scheduled medications. Pt was encourage to attend groups. Q 15 minute checks were done for safety.   R:. Pt is taking medication. Pt has no complaints.Pt receptive to treatment and safety maintained on unit.

## 2015-01-18 NOTE — Progress Notes (Signed)
Pt presented to [facility] ED reporting HI. Thoughts of throwing a gas bomb through someone's window.  Pt. Does not know who's window.  Pt. Reports he has history of substance abuse and depression and has been off his prescribed medications for several months.  Pt. Is homeless, reporting he is on disability and people, usually family members will take him in until his money runs out them they will kick him out causing the HI thoughts .  Pt. Also reports his son is going to prison for 20 years causing him increased depression.  Pt. Is tearful on assessment, stating " I am here to get help". Pt.  appears very thin, Probation officer ask pt. About his appetite and he reports he smokes THC, drinks ETOH,  And uses cocaine when he can get it instead of eating.  Pt. Is offered food and fluids, medication to relieve his anxiety and assist with sleep.  Pt. Remains safe on the unit.

## 2015-01-18 NOTE — Progress Notes (Signed)
Psychoeducational Group Note  Date:  01/18/2015 Time:  2045  Group Topic/Focus:  wrap up group  Participation Level: Did Not Attend  Participation Quality:  Not Applicable  Affect:  Not Applicable  Cognitive:  Not Applicable  Insight:  Not Applicable  Engagement in Group: Not Applicable  Additional Comments:  Pt was notified that group was beginning but returned to his room.   [name] [name] 01/18/2015, 9:57 PM

## 2015-01-18 NOTE — [facility] Suicide Risk Assessment (Signed)
[facility] Admission Suicide Risk Assessment   Nursing information obtained from:  Patient Demographic factors:  Male Current Mental Status:  Thoughts of violence towards others Loss Factors:  Loss of significant relationship Historical Factors:  NA Risk Reduction Factors:  Positive coping skills or problem solving skills Total Time spent with patient: 45 minutes Principal Problem:  Alcohol Dependence, Cocaine Abuse, Substance Induced Mood Disorder  Diagnosis:   Patient Active Problem List   Diagnosis Date Noted  . Alcohol use disorder, severe, dependence [F10.20] 01/18/2015  . Bipolar disorder [F31.9] 05/07/2014  . Suicidal ideation [R45.851]   . Major depressive episode [F32.9] 04/22/2014    Class: Acute  . Acute pancreatitis [K85.9] 03/20/2012  . Polysubstance abuse [F19.10] 03/20/2012  . Substance induced mood disorder [F19.94] 01/12/2012  . Cocaine abuse [F14.10] 01/12/2012  . Cannabis abuse [F12.10] 01/12/2012  . Alcohol dependence [F10.20] 10/15/2011  . Abnormal ECG [R94.31]   . Chest pain [R07.2]   . CAD in native artery [I25.10]   . GERD (gastroesophageal reflux disease) [K21.9]   . Tobacco abuse [Z72.0]   . History of cocaine abuse [F14.10]   . History of ETOH abuse [F10.10]   . Bradycardia [R00.1]   . Syncope [R55]      Continued Clinical Symptoms:  Alcohol Use Disorder Identification Test Final Score (AUDIT): 3 The "Alcohol Use Disorders Identification Test", Guidelines for Use in Primary Care, Second Edition.  World Pharmacologist [facility]). Score between 0-7:  no or low risk or alcohol related problems. Score between 8-15:  moderate risk of alcohol related problems. Score between 16-19:  high risk of alcohol related problems. Score 20 or above:  warrants further diagnostic evaluation for alcohol dependence and treatment.   CLINICAL FACTORS:  Patient is a 49 year old man. States he has been struggling emotionally due to significant psychosocial stressors. States  his fiance's daughter recently falsely accused him of assaulting her fiance, which resulted in him going to jail for a period of several days and now being on probation. States that now fiance wants for him to leave and is apparently in another relationship. States this has caused significant depression. He has been drinking  daily, although tends to minimize at this time. Admission BAL 193. States he has a history of cocaine dependence and that he recently relapsed on cocaine, after a long period of sobriety. States he has  A history of depression but has been off medications for years . Had been on Seroquel in the past .  Endorses neuro-vegetative symptoms of depression such as anhedonia, poor appetite, poor sleep.  At this time endorses depression but not current suicidal ideations. Upon  Admission to ED had reported homicidal ideations towards the man noted above, but at this time Denies any violent ideations at present. Currently not presenting with significant WDL symptoms.  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 Alcohol Dependence, Cocaine Abuse  Plan - Inpatient psychiatric admission. Librium Detox Protocol. Seroquel 100 mgrs QHS for Mood Disorder    Musculoskeletal: Strength & Muscle Tone: within normal limits Gait & Station: gait not examined at this time Patient leans: N/A  Psychiatric Specialty Exam: Physical Exam  ROS at present denies chest pain , denies SOB.  Blood pressure 113/66, pulse 75, temperature 98.4 F (36.9 C), temperature source Oral, resp. rate 18, height 5\' 4"  (1.626 m), weight 129 lb (58.514 kg).Body mass index is 22.13 kg/(m^2).  General Appearance: Fairly Groomed  Engineer, water::  Good  Speech:  Normal Rate  Volume:  Normal  Mood:  Depressed  Affect:  Constricted  Thought Process:  Linear  Orientation:  Other:  fully alert and attentive   Thought Content:  denies hallucinations, no delusions, ruminative about stressors  Suicidal Thoughts:   No at this time denies any thoughts of hurting self or or SI  Homicidal Thoughts:  No at this time denies any homicidal ideations towards fiance or the man he states she is now with  Memory:  recent and remote grossly intact  Judgement:  Fair  Insight:  Fair  Psychomotor Activity:  Decreased- no tremors or restlessness noted at this time  Concentration:  Good  Recall:  Good  Fund of Knowledge:Good  Language: Good  Akathisia:  Negative  Handed:  Right  AIMS (if indicated):     Assets:  Desire for Improvement Resilience  Sleep:     Cognition: WNL  ADL's:  Impaired     COGNITIVE FEATURES THAT CONTRIBUTE TO RISK:  Closed-mindedness and Loss of executive function    SUICIDE RISK:   Moderate:  Frequent suicidal ideation with limited intensity, and duration, some specificity in terms of plans, no associated intent, good self-control, limited dysphoria/symptomatology, some risk factors present, and identifiable protective factors, including available and accessible social support.  PLAN OF CARE: Patient will be admitted to inpatient psychiatric unit for stabilization and safety. Will provide and encourage milieu participation. Provide medication management and maked adjustments as needed.  Will also provide medication management to minimize risk of WDL. Will follow daily.    Medical Decision Making:  Review of Psycho-Social Stressors (1), Review or order clinical lab tests (1), Established Problem, Worsening (2) and Review of Medication Regimen & Side Effects (2)  I certify that inpatient services furnished can reasonably be expected to improve the patient's condition.   COBOS, FERNANDO 01/18/2015, 2:30 PM

## 2015-01-18 NOTE — ED Notes (Signed)
Bed: [facility] Expected date:  Expected time:  Means of arrival:  Comments: t4

## 2015-01-18 NOTE — H&P (Signed)
Psychiatric Admission Assessment Adult  Patient Identification: [name] [name]  MRN:  [id number]  Date of Evaluation:  01/18/2015  Chief Complaint:  bipolar disorder cocaine use disorder  Principal Diagnosis: Alcohol use disorder, severe, dependence  Diagnosis:   Patient Active Problem List   Diagnosis Date Noted  . Alcohol use disorder, severe, dependence [F10.20] 01/18/2015  . Bipolar disorder [F31.9] 05/07/2014  . Suicidal ideation [R45.851]   . Major depressive episode [F32.9] 04/22/2014    Class: Acute  . Acute pancreatitis [K85.9] 03/20/2012  . Polysubstance abuse [F19.10] 03/20/2012  . Substance induced mood disorder [F19.94] 01/12/2012  . Cocaine abuse [F14.10] 01/12/2012  . Cannabis abuse [F12.10] 01/12/2012  . Alcohol dependence [F10.20] 10/15/2011  . Abnormal ECG [R94.31]   . Chest pain [R07.2]   . CAD in native artery [I25.10]   . GERD (gastroesophageal reflux disease) [K21.9]   . Tobacco abuse [Z72.0]   . History of cocaine abuse [F14.10]   . History of ETOH abuse [F10.10]   . Bradycardia [R00.1]   . Syncope [R55]    History of Present Illness: [name] [name] is a 49 year old African-American male. Admitted to [facility] from the [facility] with complaints of alcohol intoxication & suicidal ideations. He reports, "The cops took me to the hospital the other day. I have been dealing with alcoholism & bad depression. I got relationship issues. That is why I feel this way. And to cope, I drink me alcohol all day long. The problem I'm dealing with is that, my fiance's ex-hushand wants to move-in with her after all these years of divorcing him. I can't take that & to cope with it, I drink & smoke me joint everyday. I drink about 3 of the 48 ounces bottles of beer. And for the first time in many years, I smoked me some crack cocaine yesterday. I have been treated for depression in the past. I have not taken any medicine for my depression in 3 years. I'm an alcoholic, currently on  probation for an assault. I collect disability checks because of learning disability on my part"  Elements:  Location:  Alcohol use disorder, dependence. Quality:  Excessive alcohol consumption. Severity:  Severe "I drink alcohol all day". Timing:  Current & acute. Duration:  Chronic & continuous. Context:  "I had relationship problems, to cope, I had to drink too much".  Associated Signs/Symptoms:  Depression Symptoms:  depressed mood, insomnia, anxiety,  (Hypo) Manic Symptoms:  Impulsivity,  Anxiety Symptoms:  Generalized anxiety issues  Psychotic Symptoms:  Says, hears voices sometimes  PTSD Symptoms: NA  Total Time spent with patient: 1 hour  Past Medical History:  Past Medical History  Diagnosis Date  . Chest pain, mid sternal   . CAD in native artery     a. Nonobstructive cath 11/2007;  b. Presented with ST elevation - Nonobstructive cath 08/2011  . GERD (gastroesophageal reflux disease)   . Tobacco abuse   . Marijuana abuse     a. uses ~ 1x /wk or less  . History of cocaine abuse     a. quit ? 2009  . History of ETOH abuse     a. drinks 2 "40's" / wk  . Bradycardia     a. asymptomatic  . Bipolar disorder   . Syncope     a. 12/2010 - presumed to be vasovagal  . Abnormal ECG     a. early repolarization  . Coronary artery disease   . Pneumonia   . Stomach  ulcer   . Arthritis     "my whole left side" (05/03/2014)    Past Surgical History  Procedure Laterality Date  . Cardiac catheterization  2009; 08/2011    [name] 08/06/2011  . Left heart catheterization with coronary angiogram N/A 08/16/2011    Procedure: LEFT HEART CATHETERIZATION WITH CORONARY ANGIOGRAM;  Surgeon: [name], MD;  Location: [facility] CATH LAB;  Service: Cardiovascular;  Laterality: N/A;   Family History: History reviewed. No pertinent family history.  Social History:  History  Alcohol Use  . 28.8 oz/week  . 48 Cans of beer per week    Comment: 05/03/2014 "2 40oz beers/day"      History  Drug Use  . Yes  . Special: Marijuana, Cocaine    Comment: 05/03/2014 "last marijuana was ~ 3 days ago; last cocaine was in 2014"    Social History   Social History  . Marital Status: Single    Spouse Name: N/A  . Number of Children: N/A  . Years of Education: N/A   Social History Main Topics  . Smoking status: Current Every Day Smoker -- 0.50 packs/day for 20 years    Types: Cigarettes  . Smokeless tobacco: Never Used  . Alcohol Use: 28.8 oz/week    48 Cans of beer per week     Comment: 05/03/2014 "2 40oz beers/day"  . Drug Use: Yes    Special: Marijuana, Cocaine     Comment: 05/03/2014 "last marijuana was ~ 3 days ago; last cocaine was in 2014"  . Sexual Activity: Yes   Other Topics Concern  . None   Social History Narrative   Lives with his daughter currently and receives SSI.    Additional Social History: Pain Medications: none Prescriptions: none Over the Counter: none History of alcohol / drug use?: Yes Negative Consequences of Use: Personal relationships Withdrawal Symptoms: Tremors  Musculoskeletal: Strength & Muscle Tone: within normal limits Gait & Station: normal Patient leans: N/A  Psychiatric Specialty Exam: Physical Exam  Constitutional: He is oriented to person, place, and time. He appears well-developed and well-nourished.  HENT:  Head: Normocephalic.  Eyes: Pupils are equal, round, and reactive to light.  Neck: Normal range of motion.  Cardiovascular: Normal rate.   Respiratory: Effort normal.  Genitourinary:  Denies any issues in this area   Musculoskeletal: Normal range of motion.  Neurological: He is alert and oriented to person, place, and time.  Skin: Skin is warm and dry.  Psychiatric: His speech is normal. Thought content normal. His mood appears anxious. His affect is not angry, not blunt, not labile and not inappropriate. He is actively hallucinating ("I hear voices sometimes"). Cognition and memory are normal. He  expresses impulsivity. He exhibits a depressed mood.    Review of Systems  Constitutional: Negative.   HENT: Negative.   Eyes: Negative.   Respiratory: Negative.   Cardiovascular: Negative.        Hx. Cardiac issues  Gastrointestinal: Negative.   Genitourinary: Negative.   Musculoskeletal: Negative.   Skin: Negative.   Neurological: Negative.   Endo/Heme/Allergies: Negative.   Psychiatric/Behavioral: Positive for depression and substance abuse (Alcohol dependence). Negative for hallucinations and memory loss. The patient has insomnia. The patient is not nervous/anxious.     Blood pressure 113/66, pulse 75, temperature 98.4 F (36.9 C), temperature source Oral, resp. rate 18, height 5\' 4"  (1.626 m), weight 58.514 kg (129 lb).Body mass index is 22.13 kg/(m^2).  General Appearance: Disheveled  Eye Contact::  Good  Speech:  Clear and Coherent  Volume:  Normal  Mood:  Anxious and Depressed  Affect:  Restricted  Thought Process:  Coherent and Intact  Orientation:  Full (Time, Place, and Person)  Thought Content:  Rumination and admits hearing voices sometimes  Suicidal Thoughts:  No  Homicidal Thoughts:  No  Memory:  Immediate;   Fair Recent;   Fair Remote;   Fair  Judgement:  Fair  Insight:  Present  Psychomotor Activity:  Normal  Concentration:  Fair  Recall:  [employer] of Knowledge:Fair  Language: Good  Akathisia:  No  Handed:  Right  AIMS (if indicated):     Assets:  Communication Skills Desire for Improvement  ADL's:  Impaired  Cognition: Fair  Sleep: Poor   Risk to Self: Is patient at risk for suicide?: No Risk to Others: No Prior Inpatient Therapy: Yes Prior Outpatient Therapy: Yes  Alcohol Screening: 1. How often do you have a drink containing alcohol?: 2 to 3 times a week 2. How many drinks containing alcohol do you have on a typical day when you are drinking?: 1 or 2 3. How often do you have six or more drinks on one occasion?: Never Preliminary Score:  0 9. Have you or someone else been injured as a result of your drinking?: No 10. Has a relative or friend or a doctor or another health worker been concerned about your drinking or suggested you cut down?: No Alcohol Use Disorder Identification Test Final Score (AUDIT): 3 Brief Intervention: AUDIT score less than 7 or less-screening does not suggest unhealthy drinking-brief intervention not indicated  Allergies:   Allergies  Allergen Reactions  . Aspirin     Aggravates ulcer. "Causes chest pain."  . Pepperoni [Pickled Meat] Other (See Comments)    aggrevates ulcer  . Tomato Other (See Comments)    Foods with tomato sauce aggrevate ulcers  . Tylenol [Acetaminophen]     Pt reports hx of ulcers   Lab Results:  Results for orders placed or performed during the hospital encounter of 01/18/15 (from the past 48 hour(s))  Urine rapid drug screen (hosp performed)     Status: Abnormal   Collection Time: 01/18/15 12:43 AM  Result Value Ref Range   Opiates NONE DETECTED NONE DETECTED   Cocaine NONE DETECTED NONE DETECTED   Benzodiazepines NONE DETECTED NONE DETECTED   Amphetamines NONE DETECTED NONE DETECTED   Tetrahydrocannabinol POSITIVE (A) NONE DETECTED   Barbiturates NONE DETECTED NONE DETECTED    Comment:        DRUG SCREEN FOR MEDICAL PURPOSES ONLY.  IF CONFIRMATION IS NEEDED FOR ANY PURPOSE, NOTIFY LAB WITHIN 5 DAYS.        LOWEST DETECTABLE LIMITS FOR URINE DRUG SCREEN Drug Class       Cutoff (ng/mL) Amphetamine      1000 Barbiturate      200 Benzodiazepine   672 Tricyclics       094 Opiates          300 Cocaine          300 THC              50   CBC with Differential     Status: None   Collection Time: 01/18/15 12:44 AM  Result Value Ref Range   WBC 6.8 4.0 - 10.5 K/uL   RBC 4.98 4.22 - 5.81 MIL/uL   Hemoglobin 15.7 13.0 - 17.0 g/dL   HCT 45.3 39.0 - 52.0 %   MCV 91.0  78.0 - 100.0 fL   MCH 31.5 26.0 - 34.0 pg   MCHC 34.7 30.0 - 36.0 g/dL   RDW 14.2 11.5 - 15.5 %    Platelets 219 150 - 400 K/uL   Neutrophils Relative % 69 43 - 77 %   Neutro Abs 4.7 1.7 - 7.7 K/uL   Lymphocytes Relative 25 12 - 46 %   Lymphs Abs 1.7 0.7 - 4.0 K/uL   Monocytes Relative 5 3 - 12 %   Monocytes Absolute 0.3 0.1 - 1.0 K/uL   Eosinophils Relative 1 0 - 5 %   Eosinophils Absolute 0.1 0.0 - 0.7 K/uL   Basophils Relative 0 0 - 1 %   Basophils Absolute 0.0 0.0 - 0.1 K/uL  Ethanol     Status: Abnormal   Collection Time: 01/18/15 12:44 AM  Result Value Ref Range   Alcohol, Ethyl (B) 193 (H) <5 mg/dL    Comment:        LOWEST DETECTABLE LIMIT FOR SERUM ALCOHOL IS 5 mg/dL FOR MEDICAL PURPOSES ONLY   I-stat chem 8, ed     Status: Abnormal   Collection Time: 01/18/15 12:50 AM  Result Value Ref Range   Sodium 141 135 - 145 mmol/L   Potassium 4.1 3.5 - 5.1 mmol/L   Chloride 108 101 - 111 mmol/L   BUN 5 (L) 6 - 20 mg/dL   Creatinine, Ser 1.30 (H) 0.61 - 1.24 mg/dL   Glucose, Bld 85 65 - 99 mg/dL   Calcium, Ion 1.14 1.12 - 1.23 mmol/L   TCO2 21 0 - 100 mmol/L   Hemoglobin 17.7 (H) 13.0 - 17.0 g/dL   HCT 52.0 39.0 - 52.0 %   Current Medications: Current Facility-Administered Medications  Medication Dose Route Frequency Provider Last Rate Last Dose  . alum & mag hydroxide-simeth (MAALOX/MYLANTA) 200-200-20 MG/5ML suspension 30 mL  30 mL Oral Q4H PRN Benjamine Mola, FNP      . chlordiazePOXIDE (LIBRIUM) capsule 25 mg  25 mg Oral Q6H PRN Benjamine Mola, FNP      . chlordiazePOXIDE (LIBRIUM) capsule 25 mg  25 mg Oral QID Benjamine Mola, FNP   25 mg at 01/18/15 1202   Followed by  . [START ON 01/19/2015] chlordiazePOXIDE (LIBRIUM) capsule 25 mg  25 mg Oral TID Benjamine Mola, FNP       Followed by  . [START ON 01/20/2015] chlordiazePOXIDE (LIBRIUM) capsule 25 mg  25 mg Oral BH-qamhs Benjamine Mola, FNP       Followed by  . [START ON 01/22/2015] chlordiazePOXIDE (LIBRIUM) capsule 25 mg  25 mg Oral Daily Benjamine Mola, FNP      . hydrOXYzine (ATARAX/VISTARIL) tablet 25 mg  25  mg Oral Q6H PRN Benjamine Mola, FNP      . loperamide (IMODIUM) capsule 2-4 mg  2-4 mg Oral PRN Benjamine Mola, FNP      . magnesium hydroxide (MILK OF MAGNESIA) suspension 30 mL  30 mL Oral Daily PRN Benjamine Mola, FNP      . multivitamin with minerals tablet 1 tablet  1 tablet Oral Daily Benjamine Mola, FNP   1 tablet at 01/18/15 1202  . ondansetron (ZOFRAN-ODT) disintegrating tablet 4 mg  4 mg Oral Q6H PRN Benjamine Mola, FNP      . [START ON 01/19/2015] thiamine (VITAMIN B-1) tablet 100 mg  100 mg Oral Daily Benjamine Mola, FNP       PTA Medications: Prescriptions  prior to admission  Medication Sig Dispense Refill Last Dose  . famotidine (PEPCID) 20 MG tablet Take 1 tablet (20 mg total) by mouth 2 (two) times daily. 30 tablet 0 Past Month at Unknown time  . omeprazole (PRILOSEC) 20 MG capsule Take 1 capsule (20 mg total) by mouth daily. 20 capsule 0 Past Month at Unknown time  . pantoprazole (PROTONIX) 40 MG tablet Take 1 tablet (40 mg total) by mouth daily. (Patient not taking: Reported on 06/05/2014) 30 tablet 0 Unknown at Unknown time  . QUEtiapine (SEROQUEL) 200 MG tablet Take 1 tablet (200 mg total) by mouth at bedtime. (Patient not taking: Reported on 05/03/2014) 14 tablet 0 Unknown at Unknown time  . traMADol (ULTRAM) 50 MG tablet Take 1 tablet (50 mg total) by mouth every 6 (six) hours as needed. (Patient not taking: Reported on 06/05/2014) 15 tablet 0 Unknown at Unknown time   Previous Psychotropic Medications: Yes   Substance Abuse History in the last 12 months:  Yes.    Consequences of Substance Abuse: Medical Consequences:  Liver damage, Possible death by overdose Legal Consequences:  Arrests, jail time, Loss of driving privilege. Family Consequences:  Family discord, divorce and or separation.  Consequences of Substance Abuse: Medical Consequences:  Liver damage, Possible death by overdose Legal Consequences:  Arrests, jail time, Loss of driving privilege. Family  Consequences:  Family discord, divorce and or separation.  Results for orders placed or performed during the hospital encounter of 01/18/15 (from the past 72 hour(s))  Urine rapid drug screen (hosp performed)     Status: Abnormal   Collection Time: 01/18/15 12:43 AM  Result Value Ref Range   Opiates NONE DETECTED NONE DETECTED   Cocaine NONE DETECTED NONE DETECTED   Benzodiazepines NONE DETECTED NONE DETECTED   Amphetamines NONE DETECTED NONE DETECTED   Tetrahydrocannabinol POSITIVE (A) NONE DETECTED   Barbiturates NONE DETECTED NONE DETECTED    Comment:        DRUG SCREEN FOR MEDICAL PURPOSES ONLY.  IF CONFIRMATION IS NEEDED FOR ANY PURPOSE, NOTIFY LAB WITHIN 5 DAYS.        LOWEST DETECTABLE LIMITS FOR URINE DRUG SCREEN Drug Class       Cutoff (ng/mL) Amphetamine      1000 Barbiturate      200 Benzodiazepine   034 Tricyclics       742 Opiates          300 Cocaine          300 THC              50   CBC with Differential     Status: None   Collection Time: 01/18/15 12:44 AM  Result Value Ref Range   WBC 6.8 4.0 - 10.5 K/uL   RBC 4.98 4.22 - 5.81 MIL/uL   Hemoglobin 15.7 13.0 - 17.0 g/dL   HCT 45.3 39.0 - 52.0 %   MCV 91.0 78.0 - 100.0 fL   MCH 31.5 26.0 - 34.0 pg   MCHC 34.7 30.0 - 36.0 g/dL   RDW 14.2 11.5 - 15.5 %   Platelets 219 150 - 400 K/uL   Neutrophils Relative % 69 43 - 77 %   Neutro Abs 4.7 1.7 - 7.7 K/uL   Lymphocytes Relative 25 12 - 46 %   Lymphs Abs 1.7 0.7 - 4.0 K/uL   Monocytes Relative 5 3 - 12 %   Monocytes Absolute 0.3 0.1 - 1.0 K/uL   Eosinophils Relative 1 0 -  5 %   Eosinophils Absolute 0.1 0.0 - 0.7 K/uL   Basophils Relative 0 0 - 1 %   Basophils Absolute 0.0 0.0 - 0.1 K/uL  Ethanol     Status: Abnormal   Collection Time: 01/18/15 12:44 AM  Result Value Ref Range   Alcohol, Ethyl (B) 193 (H) <5 mg/dL    Comment:        LOWEST DETECTABLE LIMIT FOR SERUM ALCOHOL IS 5 mg/dL FOR MEDICAL PURPOSES ONLY   I-stat chem 8, ed     Status: Abnormal    Collection Time: 01/18/15 12:50 AM  Result Value Ref Range   Sodium 141 135 - 145 mmol/L   Potassium 4.1 3.5 - 5.1 mmol/L   Chloride 108 101 - 111 mmol/L   BUN 5 (L) 6 - 20 mg/dL   Creatinine, Ser 1.30 (H) 0.61 - 1.24 mg/dL   Glucose, Bld 85 65 - 99 mg/dL   Calcium, Ion 1.14 1.12 - 1.23 mmol/L   TCO2 21 0 - 100 mmol/L   Hemoglobin 17.7 (H) 13.0 - 17.0 g/dL   HCT 52.0 39.0 - 52.0 %    Observation Level/Precautions:  15 minute checks  Laboratory:  Per ED, BAL 193, (+) THC  Psychotherapy:    Medications:    Consultations:    Discharge Concerns:    Estimated LOS:  Other:     Psychological Evaluations: Yes   Treatment Plan Summary: Daily contact with patient to assess and evaluate symptoms and progress in treatment and Medication management: 1. Admit for crisis management and stabilization, estimated length of stay 3-5 days.  2. Medication management to reduce current symptoms to base line and improve the patient's overall level of functioning: Continue Librium detox protocols for alcohol detox treatment, resume Seroquel 200 mg for mood control  3. Treat health problems as indicated.  4. Develop treatment plan to decrease risk of relapse upon discharge and the need for readmission.  5. Psycho-social education regarding relapse prevention and self care.  6. Health care follow up as needed for medical problems.  7. Review, reconcile, and reinstate any pertinent home medications for other health issues where appropriate. 8. Call for consults with hospitalist for any additional specialty patient care services as needed.  Medical Decision Making:  New problem, with additional work up planned, Review of Psycho-Social Stressors (1), Review or order clinical lab tests (1), Discuss test with performing physician (1), Review and summation of old records (2), Review of Medication Regimen & Side Effects (2) and Review of New Medication or Change in Dosage (2)  I certify that inpatient services  furnished can reasonably be expected to improve the patient's condition.   Encarnacion Slates, PMHMNP, FNP-BC 8/13/20161:17 PM Patient case discussed with NP and patient seen by me Agree with NP Assessment and Plan Patient is a 49 year old man. States he has been struggling emotionally due to significant psychosocial stressors. States his fiance's daughter recently falsely accused him of assaulting her fiance, which resulted in him going to jail for a period of several days and now being on probation. States that now fiance wants for him to leave and is apparently in another relationship. States this has caused significant depression. He has been drinking daily, although tends to minimize at this time. Admission BAL 193. States he has a history of cocaine dependence and that he recently relapsed on cocaine, after a long period of sobriety. States he has A history of depression but has been off medications for years .  Had been on Seroquel in the past .  Endorses neuro-vegetative symptoms of depression such as anhedonia, poor appetite, poor sleep.  At this time endorses depression but not current suicidal ideations. Upon Admission to ED had reported homicidal ideations towards the man noted above, but at this time Denies any violent ideations at present. Currently not presenting with significant WDL symptoms.  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 Alcohol Dependence, Cocaine Abuse  Plan - Inpatient psychiatric admission. Librium Detox Protocol. Seroquel 100 mgrs QHS for Mood Disorder

## 2015-01-18 NOTE — ED Notes (Signed)
Report given to [facility] in [place].

## 2015-01-19 DIAGNOSIS — F1024 Alcohol dependence with alcohol-induced mood disorder: Principal | ICD-10-CM

## 2015-01-19 DIAGNOSIS — F329 Major depressive disorder, single episode, unspecified: Secondary | ICD-10-CM

## 2015-01-19 DIAGNOSIS — G4700 Insomnia, unspecified: Secondary | ICD-10-CM

## 2015-01-19 MED ORDER — TRAZODONE HCL 50 MG PO TABS
50.0000 mg | ORAL_TABLET | Freq: Every day | ORAL | Status: DC
  Administered 2015-01-19: 50 mg via ORAL
  Filled 2015-01-19 (×2): qty 1

## 2015-01-19 MED ORDER — QUETIAPINE FUMARATE 50 MG PO TABS: 50.0000 mg | ORAL_TABLET | Freq: Every day | ORAL | Status: DC

## 2015-01-19 MED ORDER — QUETIAPINE FUMARATE 25 MG PO TABS
25.0000 mg | ORAL_TABLET | Freq: Every day | ORAL | Status: DC
  Administered 2015-01-19: 25 mg via ORAL
  Filled 2015-01-19 (×2): qty 1

## 2015-01-19 NOTE — Progress Notes (Signed)
Patient ID: [name] [name], male   DOB: 06-Apr-1966, 49 y.o.   MRN: [id number]   D: Pt has been very flat and depressed on the unit today. Pt did not attend any groups nor did he engage in treatment. Pt reported that he was having a bad day and that he did not know what to do. Pt reported that he was going to do better, because he wanted to live. Pt took all medication as prescribed by the doctor , no other issues or concerns noted. Pt reported being negative SI/HI, no AH/VH noted. A: 15 min checks continued for patient safety. R: Pt safety maintained.

## 2015-01-19 NOTE — [facility] Group Notes (Signed)
[facility] Group Notes: (Clinical Social Work)   01/19/2015      Type of Therapy:  Group Therapy   Participation Level:  Did Not Attend despite MHT prompting   Selmer Dominion, LCSW 01/19/2015, 12:24 PM

## 2015-01-19 NOTE — Progress Notes (Signed)
D: Pt smiled on approach, asking the writer how she's been. When asked the circumstances surrounding his adm pt stated, "I haven't taken seroquel in 3 yrs." Stated "they are going to switch me to something that works better"  Stated the "seroquel made it so he couldn't walk down the hall".  Stated he stopped taking seroquel because he was feeling good about himself. But the death of his niece was too much." Pt voiced no other questions or concerns.  A:  Support and encouragement was offered. 15 min checks continued for safety.  R: Pt remains safe.
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
## 2015-01-19 NOTE — Progress Notes (Signed)
[facility] MD Progress Note  01/19/2015 11:44 AM [name] [name]  MRN:  [id number]  Subjective:  [name] [name] reports, "I feel funny today, a little shaky. I still feel depressed about my situation. I'm tired of hopping from place, not knowing where I'm gonna end up the next day. My main goal for coming here is to get help finding a place to live & getting a residential place for alcohol treatment. I got no support out there. I need help with the kind of depression I have, it is bad. I don't sleep well at night" sometimes my drinking a lot is to not think about my problems".  Principal Problem: Alcohol dependence with alcohol-induced mood disorder  Diagnosis:   Patient Active Problem List   Diagnosis Date Noted  . Alcohol use disorder, severe, dependence [F10.20] 01/18/2015  . Alcohol dependence with alcohol-induced mood disorder [F10.24]   . Bipolar disorder [F31.9] 05/07/2014  . Suicidal ideation [R45.851]   . Major depressive episode [F32.9] 04/22/2014    Class: Acute  . Acute pancreatitis [K85.9] 03/20/2012  . Polysubstance abuse [F19.10] 03/20/2012  . Substance induced mood disorder [F19.94] 01/12/2012  . Cocaine abuse [F14.10] 01/12/2012  . Cannabis abuse [F12.10] 01/12/2012  . Alcohol dependence [F10.20] 10/15/2011  . Abnormal ECG [R94.31]   . Chest pain [R07.2]   . CAD in native artery [I25.10]   . GERD (gastroesophageal reflux disease) [K21.9]   . Tobacco abuse [Z72.0]   . History of cocaine abuse [F14.10]   . History of ETOH abuse [F10.10]   . Bradycardia [R00.1]   . Syncope [R55]    Total Time spent with patient: 25 minutes  Past Medical History:  Past Medical History  Diagnosis Date  . Chest pain, mid sternal   . CAD in native artery     a. Nonobstructive cath 11/2007;  b. Presented with ST elevation - Nonobstructive cath 08/2011  . GERD (gastroesophageal reflux disease)   . Tobacco abuse   . Marijuana abuse     a. uses ~ 1x /wk or less  . History of cocaine abuse     a. quit  ? 2009  . History of ETOH abuse     a. drinks 2 "40's" / wk  . Bradycardia     a. asymptomatic  . Bipolar disorder   . Syncope     a. 12/2010 - presumed to be vasovagal  . Abnormal ECG     a. early repolarization  . Coronary artery disease   . Pneumonia   . Stomach ulcer   . Arthritis     "my whole left side" (05/03/2014)    Past Surgical History  Procedure Laterality Date  . Cardiac catheterization  2009; 08/2011    [name] 08/06/2011  . Left heart catheterization with coronary angiogram N/A 08/16/2011    Procedure: LEFT HEART CATHETERIZATION WITH CORONARY ANGIOGRAM;  Surgeon: [name], MD;  Location: [facility] CATH LAB;  Service: Cardiovascular;  Laterality: N/A;   Family History: History reviewed. No pertinent family history. Social History:  History  Alcohol Use  . 28.8 oz/week  . 48 Cans of beer per week    Comment: 05/03/2014 "2 40oz beers/day"     History  Drug Use  . Yes  . Special: Marijuana, Cocaine    Comment: 05/03/2014 "last marijuana was ~ 3 days ago; last cocaine was in 2014"    Social History   Social History  . Marital Status: Single    Spouse Name: N/A  . Number  of Children: N/A  . Years of Education: N/A   Social History Main Topics  . Smoking status: Current Every Day Smoker -- 0.50 packs/day for 20 years    Types: Cigarettes  . Smokeless tobacco: Never Used  . Alcohol Use: 28.8 oz/week    48 Cans of beer per week     Comment: 05/03/2014 "2 40oz beers/day"  . Drug Use: Yes    Special: Marijuana, Cocaine     Comment: 05/03/2014 "last marijuana was ~ 3 days ago; last cocaine was in 2014"  . Sexual Activity: Yes   Other Topics Concern  . None   Social History Narrative   Lives with his daughter currently and receives SSI.    Additional History:    Sleep: Fair   Appetite:  Good  Assessment: Alcohol use disorder, severe, dependence  Musculoskeletal: Strength & Muscle Tone: within normal limits Gait & Station: normal Patient leans:  N/A  Psychiatric Specialty Exam: Physical Exam  ROS  Blood pressure 97/53, pulse 52, temperature 98.2 F (36.8 C), temperature source Oral, resp. rate 16, height 5\' 4"  (1.626 m), weight 58.514 kg (129 lb).Body mass index is 22.13 kg/(m^2).  General Appearance: Casual  Eye Contact::  Good  Speech:  Clear and Coherent  Volume:  Normal  Mood:  Depressed  Affect:  Flat  Thought Process:  Coherent and Goal Directed  Orientation:  Full (Time, Place, and Person)  Thought Content:  Rumination  Suicidal Thoughts:  No  Homicidal Thoughts:  No  Memory:  Grossly intact  Judgement:  Fair  Insight:  Present  Psychomotor Activity:  Tremor  Concentration:  Fair  Recall:  Good  Fund of Knowledge:Fair  Language: Good  Akathisia:  No  Handed:  Right  AIMS (if indicated):     Assets:  Desire for Improvement  ADL's:  Intact  Cognition: WNL  Sleep:  Number of Hours: 6.75   Current Medications: Current Facility-Administered Medications  Medication Dose Route Frequency Provider Last Rate Last Dose  . alum & mag hydroxide-simeth (MAALOX/MYLANTA) 200-200-20 MG/5ML suspension 30 mL  30 mL Oral Q4H PRN Benjamine Mola, FNP      . chlordiazePOXIDE (LIBRIUM) capsule 25 mg  25 mg Oral Q6H PRN Benjamine Mola, FNP      . chlordiazePOXIDE (LIBRIUM) capsule 25 mg  25 mg Oral TID Benjamine Mola, FNP       Followed by  . [START ON 01/20/2015] chlordiazePOXIDE (LIBRIUM) capsule 25 mg  25 mg Oral BH-qamhs Benjamine Mola, FNP       Followed by  . [START ON 01/22/2015] chlordiazePOXIDE (LIBRIUM) capsule 25 mg  25 mg Oral Daily Benjamine Mola, FNP      . hydrOXYzine (ATARAX/VISTARIL) tablet 25 mg  25 mg Oral Q6H PRN Benjamine Mola, FNP   25 mg at 01/18/15 2204  . loperamide (IMODIUM) capsule 2-4 mg  2-4 mg Oral PRN Benjamine Mola, FNP      . magnesium hydroxide (MILK OF MAGNESIA) suspension 30 mL  30 mL Oral Daily PRN Benjamine Mola, FNP      . multivitamin with minerals tablet 1 tablet  1 tablet Oral Daily Benjamine Mola, FNP   1 tablet at 01/19/15 1008  . ondansetron (ZOFRAN-ODT) disintegrating tablet 4 mg  4 mg Oral Q6H PRN Benjamine Mola, FNP      . pantoprazole (PROTONIX) EC tablet 40 mg  40 mg Oral Daily Encarnacion Slates, NP   40 mg  at 01/19/15 1008  . QUEtiapine (SEROQUEL) tablet 50 mg  50 mg Oral QHS Encarnacion Slates, NP      . thiamine (VITAMIN B-1) tablet 100 mg  100 mg Oral Daily Benjamine Mola, FNP   100 mg at 01/19/15 1008   Lab Results:  Results for orders placed or performed during the hospital encounter of 01/18/15 (from the past 48 hour(s))  Urine rapid drug screen (hosp performed)     Status: Abnormal   Collection Time: 01/18/15 12:43 AM  Result Value Ref Range   Opiates NONE DETECTED NONE DETECTED   Cocaine NONE DETECTED NONE DETECTED   Benzodiazepines NONE DETECTED NONE DETECTED   Amphetamines NONE DETECTED NONE DETECTED   Tetrahydrocannabinol POSITIVE (A) NONE DETECTED   Barbiturates NONE DETECTED NONE DETECTED    Comment:        DRUG SCREEN FOR MEDICAL PURPOSES ONLY.  IF CONFIRMATION IS NEEDED FOR ANY PURPOSE, NOTIFY LAB WITHIN 5 DAYS.        LOWEST DETECTABLE LIMITS FOR URINE DRUG SCREEN Drug Class       Cutoff (ng/mL) Amphetamine      1000 Barbiturate      200 Benzodiazepine   559 Tricyclics       741 Opiates          300 Cocaine          300 THC              50   CBC with Differential     Status: None   Collection Time: 01/18/15 12:44 AM  Result Value Ref Range   WBC 6.8 4.0 - 10.5 K/uL   RBC 4.98 4.22 - 5.81 MIL/uL   Hemoglobin 15.7 13.0 - 17.0 g/dL   HCT 45.3 39.0 - 52.0 %   MCV 91.0 78.0 - 100.0 fL   MCH 31.5 26.0 - 34.0 pg   MCHC 34.7 30.0 - 36.0 g/dL   RDW 14.2 11.5 - 15.5 %   Platelets 219 150 - 400 K/uL   Neutrophils Relative % 69 43 - 77 %   Neutro Abs 4.7 1.7 - 7.7 K/uL   Lymphocytes Relative 25 12 - 46 %   Lymphs Abs 1.7 0.7 - 4.0 K/uL   Monocytes Relative 5 3 - 12 %   Monocytes Absolute 0.3 0.1 - 1.0 K/uL   Eosinophils Relative 1 0 - 5 %    Eosinophils Absolute 0.1 0.0 - 0.7 K/uL   Basophils Relative 0 0 - 1 %   Basophils Absolute 0.0 0.0 - 0.1 K/uL  Ethanol     Status: Abnormal   Collection Time: 01/18/15 12:44 AM  Result Value Ref Range   Alcohol, Ethyl (B) 193 (H) <5 mg/dL    Comment:        LOWEST DETECTABLE LIMIT FOR SERUM ALCOHOL IS 5 mg/dL FOR MEDICAL PURPOSES ONLY   I-stat chem 8, ed     Status: Abnormal   Collection Time: 01/18/15 12:50 AM  Result Value Ref Range   Sodium 141 135 - 145 mmol/L   Potassium 4.1 3.5 - 5.1 mmol/L   Chloride 108 101 - 111 mmol/L   BUN 5 (L) 6 - 20 mg/dL   Creatinine, Ser 1.30 (H) 0.61 - 1.24 mg/dL   Glucose, Bld 85 65 - 99 mg/dL   Calcium, Ion 1.14 1.12 - 1.23 mmol/L   TCO2 21 0 - 100 mmol/L   Hemoglobin 17.7 (H) 13.0 - 17.0 g/dL   HCT 52.0 39.0 -  52.0 %   Physical Findings: AIMS:  , ,  ,  ,    CIWA:  CIWA-Ar Total: 3 COWS:     Treatment Plan Summary: Daily contact with patient to assess and evaluate symptoms and progress in treatment and Medication management:  Plan: Supportive approach/coping skills/relapse prevention           Depression: will initiate Celexa to 20 mg and continue to work with mindfulness, CBT help  identify the cognitive distortions that keep the depression going          Discuss other life style changes that can help with both his depression and his alcohol use such like exercise, meditation           Alcohol Dependence: will continue the detox protocol, will use motivational interviewing and encourage to pursue total abstinence. Will decrease Seroquel to 50 mg Q hs for mood control. Insomnia: Initiate Trazodone 50 mg Q hs.           Medical Decision Making:  Review of Psycho-Social Stressors (1), Review of Last Therapy Session (1), Review of Medication Regimen & Side Effects (2) and Review of New Medication or Change in Dosage (2)  Encarnacion Slates, PMHNP, FNP-BC 01/19/2015, 11:44 AM Agree with  NP  Progress Note, as above  Neita Garnet, MD

## 2015-01-19 NOTE — Progress Notes (Signed)
Patient did not attend the evening speaker [place] meeting. Pt was notified that group was beginning, declined to go, and would walk the hall and return to his room.

## 2015-01-19 NOTE — BHH Suicide Risk Assessment (Signed)
[facility] INPATIENT:  Family/Significant Other Suicide Prevention Education  Suicide Prevention Education:  Patient Refusal for Family/Significant Other Suicide Prevention Education: The patient [name] [name] has refused to provide written consent for family/significant other to be provided Family/Significant Other Suicide Prevention Education during admission and/or prior to discharge.  Physician notified.  Lysle Dingwall 01/19/2015, 1:12 PM

## 2015-01-20 MED ORDER — TRAZODONE HCL 100 MG PO TABS
100.0000 mg | ORAL_TABLET | Freq: Every day | ORAL | Status: DC
  Administered 2015-01-20 – 2015-01-26 (×7): 100 mg via ORAL
  Filled 2015-01-20 (×4): qty 1
  Filled 2015-01-20: qty 3
  Filled 2015-01-20 (×4): qty 1
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
## 2015-01-20 NOTE — Progress Notes (Signed)
D: Patient restless, pacing the hallway.  He is pleasant upon approach.  Reports continuing depressive symptoms.  He is not attending groups.  He denies SI/HI/AVH.  Patient states he was taking seroquel which made him very sedated.  He is pleased the the dosage has been reduced.  He reports minimal withdrawal symptoms. A: Continue to monitor medication management and MD orders.  Safety checks completed every 15 minutes per protocol.  Offer support and encouragement as needed. R: Patient's behavior is appropriate to situation.

## 2015-01-20 NOTE — Progress Notes (Signed)
Recreation Therapy Notes  Date: 08.15.2016 Time: 9:30am  Location: [address] Group Room   Group Topic: Stress Management  Goal Area(s) Addresses:  Patient will actively participate in stress management techniques presented during session.   Behavioral Response: Did not attend.   Laureen Ochs Alexsis Kathman, LRT/CTRS  Darlina Mccaughey L 01/20/2015 3:02 PM

## 2015-01-20 NOTE — B[facility] Group Notes (Signed)
   [facility] LCSW Aftercare Discharge Planning Group Note  01/20/2015  8:45 AM   Participation Quality: Alert, Appropriate and Oriented  Mood/Affect: Appropriate  Depression Rating: "My depression is level today"  Anxiety Rating: "Low anxiety, I'm just eager to get into treatment"  Thoughts of Suicide: Pt denies SI/HI  Will you contract for safety? Yes  Current AVH: Pt denies  Plan for Discharge/Comments: Pt attended discharge planning group and actively participated in group. CSW provided pt with today's workbook. Patient reports that he is interested in [facility], [facility], or [facility].  Transportation Means: Pt reports access to transportation  Supports: No supports mentioned at this time  Tilden Fossa, MSW, [place] Social Worker [employer] [phone]
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
## 2015-01-20 NOTE — BHH Group Notes (Signed)
Adult Psychoeducational Group Note  Date:  01/20/2015 Time:  9:51 PM  Group Topic/Focus:  AA Meeting  Participation Level:  Did Not Attend  Participation Quality:  None  Affect:  None  Cognitive:  None  Insight: None  Engagement in Group:  None  Modes of Intervention:  Discussion  Additional Comments:  Patient did not attend group.  Victorino Sparrow A 01/20/2015, 9:51 PM

## 2015-01-20 NOTE — Clinical Social Work Note (Signed)
Referrals faxed to [facility] and [facility] for review at patient's request.  Tilden Fossa, MSW, [place] Worker [facility] [phone]

## 2015-01-21 NOTE — Progress Notes (Signed)
Recreation Therapy Notes  Animal-Assisted Activity (AAA) Program Checklist/Progress Notes Patient Eligibility Criteria Checklist & Daily Group note for Rec Tx Intervention  Date: 08.16.2016 Time: 2:45pm Location: [address]    AAA/T Program Assumption of Risk Form signed by Patient/ or Parent Legal Guardian yes  Patient is free of allergies or sever asthma yes  Patient reports no fear of animals yes  Patient reports no history of cruelty to animals yes  Patient understands his/her participation is voluntary yes  Behavioral Response: Did not attend.    Laureen Ochs Chessie Neuharth, LRT/CTRS        Kemora Pinard L 01/21/2015 3:15 PM

## 2015-01-21 NOTE — BHH Group Notes (Signed)
[place] LCSW Group Therapy  01/21/2015   1:15 PM   Type of Therapy:  Group Therapy  Participation Level:  Active  Participation Quality:  Attentive, Sharing and Supportive  Affect:  Depressed and Flat  Cognitive:  Alert and Oriented  Insight:  Developing/Improving and Engaged  Engagement in Therapy:  Developing/Improving and Engaged  Modes of Intervention:  Clarification, Confrontation, Discussion, Education, Exploration, Limit-setting, Orientation, Problem-solving, Rapport Building, Art therapist, Socialization and Support  Summary of Progress/Problems: The topic for group therapy was feelings about diagnosis.  Pt actively participated in group discussion on their past and current diagnosis and how they feel towards this.  Pt also identified how society and family members judge them, based on their diagnosis as well as stereotypes and stigmas.  Patient was quiet for majority of group but did share towards the end that his family relationships had changed over the years and that his family was important to him. CSW and other group members provided patient with emotional support and encouragement.  Tilden Fossa, MSW, [place] Worker [facility] [phone]

## 2015-01-21 NOTE — BHH Group Notes (Signed)
[place] Group Notes:  (Nursing/MHT/Case Management/Adjunct)  Date:  01/21/2015  Time:  0900 am   Type of Therapy:  Psychoeducational Skills  Participation Level:  Did Not Attend  Patient invited; declined to attend.  Zipporah Plants 01/21/2015, 10:12 AM
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
## 2015-01-21 NOTE — Progress Notes (Signed)
[facility] MD Progress Note  01/21/2015 5:37 PM [name] [name]  MRN:  [id number] Subjective:  [name] [name] endorses that he is ready to work on  himself get his life back together. States he is committed to abstinence. Continues to endorse that he needs to go a residential treatment program. States he does not think he will be able to make it otherwise He did sleep better with the Trazodone Principal Problem: Alcohol dependence with alcohol-induced mood disorder Diagnosis:   Patient Active Problem List   Diagnosis Date Noted  . Alcohol use disorder, severe, dependence [F10.20] 01/18/2015  . Alcohol dependence with alcohol-induced mood disorder [F10.24]   . Bipolar disorder [F31.9] 05/07/2014  . Suicidal ideation [R45.851]   . Major depressive episode [F32.9] 04/22/2014    Class: Acute  . Acute pancreatitis [K85.9] 03/20/2012  . Polysubstance abuse [F19.10] 03/20/2012  . Substance induced mood disorder [F19.94] 01/12/2012  . Cocaine abuse [F14.10] 01/12/2012  . Cannabis abuse [F12.10] 01/12/2012  . Alcohol dependence [F10.20] 10/15/2011  . Abnormal ECG [R94.31]   . Chest pain [R07.2]   . CAD in native artery [I25.10]   . GERD (gastroesophageal reflux disease) [K21.9]   . Tobacco abuse [Z72.0]   . History of cocaine abuse [F14.10]   . History of ETOH abuse [F10.10]   . Bradycardia [R00.1]   . Syncope [R55]    Total Time spent with patient: 30 minutes   Past Medical History:  Past Medical History  Diagnosis Date  . Chest pain, mid sternal   . CAD in native artery     a. Nonobstructive cath 11/2007;  b. Presented with ST elevation - Nonobstructive cath 08/2011  . GERD (gastroesophageal reflux disease)   . Tobacco abuse   . Marijuana abuse     a. uses ~ 1x /wk or less  . History of cocaine abuse     a. quit ? 2009  . History of ETOH abuse     a. drinks 2 "40's" / wk  . Bradycardia     a. asymptomatic  . Bipolar disorder   . Syncope     a. 12/2010 - presumed to be vasovagal  . Abnormal  ECG     a. early repolarization  . Coronary artery disease   . Pneumonia   . Stomach ulcer   . Arthritis     "my whole left side" (05/03/2014)    Past Surgical History  Procedure Laterality Date  . Cardiac catheterization  2009; 08/2011    [name] [name] 08/06/2011  . Left heart catheterization with coronary angiogram N/A 08/16/2011    Procedure: LEFT HEART CATHETERIZATION WITH CORONARY ANGIOGRAM;  Surgeon: [name] Booze, MD;  Location: [facility] CATH LAB;  Service: Cardiovascular;  Laterality: N/A;   Family History: History reviewed. No pertinent family history. Social History:  History  Alcohol Use  . 28.8 oz/week  . 48 Cans of beer per week    Comment: 05/03/2014 "2 40oz beers/day"     History  Drug Use  . Yes  . Special: Marijuana, Cocaine    Comment: 05/03/2014 "last marijuana was ~ 3 days ago; last cocaine was in 2014"    Social History   Social History  . Marital Status: Single    Spouse Name: N/A  . Number of Children: N/A  . Years of Education: N/A   Social History Main Topics  . Smoking status: Current Every Day Smoker -- 0.50 packs/day for 20 years    Types: Cigarettes  . Smokeless tobacco: Never Used  .  Alcohol Use: 28.8 oz/week    48 Cans of beer per week     Comment: 05/03/2014 "2 40oz beers/day"  . Drug Use: Yes    Special: Marijuana, Cocaine     Comment: 05/03/2014 "last marijuana was ~ 3 days ago; last cocaine was in 2014"  . Sexual Activity: Yes   Other Topics Concern  . None   Social History Narrative   Lives with his daughter currently and receives SSI.    Additional History:    Sleep: Fair  Appetite:  Fair   Assessment:   Musculoskeletal: Strength & Muscle Tone: within normal limits Gait & Station: normal Patient leans: normal   Psychiatric Specialty Exam: Physical Exam  Review of Systems  Constitutional: Negative.   HENT: Negative.   Eyes: Negative.   Respiratory: Negative.   Cardiovascular: Negative.   Gastrointestinal: Negative.    Genitourinary: Negative.   Musculoskeletal: Negative.   Skin: Negative.   Neurological: Negative.   [name]/Heme/Allergies: Negative.   Psychiatric/Behavioral: Positive for depression and substance abuse. The patient is nervous/anxious and has insomnia.     Blood pressure 99/60, pulse 60, temperature 97.9 F (36.6 C), temperature source Oral, resp. rate 16, height 5\' 4"  (1.626 m), weight 58.514 kg (129 lb).Body mass index is 22.13 kg/(m^2).  General Appearance: Fairly Groomed  Engineer, water::  Fair  Speech:  Clear and Coherent  Volume:  fluctuates  Mood:  Anxious and Depressed  Affect:  Restricted  Thought Process:  Coherent and Goal Directed  Orientation:  Full (Time, Place, and Person)  Thought Content:  symptoms events worries concerns  Suicidal Thoughts:  No  Homicidal Thoughts:  No  Memory:  Immediate;   Fair Recent;   Fair Remote;   Fair  Judgement:  Fair  Insight:  Present  Psychomotor Activity:  Restlessness  Concentration:  Fair  Recall:  [employer] of Knowledge:Fair  Language: Fair  Akathisia:  No  Handed:  Right  AIMS (if indicated):     Assets:  Desire for Improvement  ADL's:  Intact  Cognition: WNL  Sleep:  Number of Hours: 6.5     Current Medications: Current Facility-Administered Medications  Medication Dose Route Frequency Provider Last Rate Last Dose  . alum & mag hydroxide-simeth (MAALOX/MYLANTA) 200-200-20 MG/5ML suspension 30 mL  30 mL Oral Q4H PRN Benjamine Mola, FNP      . [START ON 01/22/2015] chlordiazePOXIDE (LIBRIUM) capsule 25 mg  25 mg Oral Daily Jaciel C Withrow, FNP      . magnesium hydroxide (MILK OF MAGNESIA) suspension 30 mL  30 mL Oral Daily PRN Benjamine Mola, FNP      . multivitamin with minerals tablet 1 tablet  1 tablet Oral Daily Benjamine Mola, FNP   1 tablet at 01/21/15 [phone]  . pantoprazole (PROTONIX) EC tablet 40 mg  40 mg Oral Daily Encarnacion Slates, NP   40 mg at 01/21/15 0819  . thiamine (VITAMIN B-1) tablet 100 mg  100 mg Oral  Daily Benjamine Mola, FNP   100 mg at 01/21/15 7412  . traZODone (DESYREL) tablet 100 mg  100 mg Oral QHS Nicholaus Bloom, MD   100 mg at 01/20/15 2205    Lab Results: No results found for this or any previous visit (from the past 19 hour(s)).  Physical Findings: AIMS: Facial and Oral Movements Muscles of Facial Expression: None, normal Lips and Perioral Area: None, normal Jaw: None, normal Tongue: None, normal,Extremity Movements Upper (arms, wrists, hands, fingers):  None, normal Lower (legs, knees, ankles, toes): None, normal, Trunk Movements Neck, shoulders, hips: None, normal, Overall Severity Severity of abnormal movements (highest score from questions above): None, normal Incapacitation due to abnormal movements: None, normal Patient's awareness of abnormal movements (rate only patient's report): No Awareness,    CIWA:  CIWA-Ar Total: 0 COWS:     Treatment Plan Summary: Daily contact with patient to assess and evaluate symptoms and progress in treatment and Medication management Supportive approach/coping skills Alcohol dependence; Librium detox as per protocol/work a relapse prevention plan Mood instability; continue to reassess for the need for psychotropics Continue to explore residential treatment options   Medical Decision Making:  Review of Psycho-Social Stressors (1) and Review of Medication Regimen & Side Effects (2)     Roselene Gray A 01/21/2015, 5:37 PM

## 2015-01-21 NOTE — Progress Notes (Signed)
D: Patient has been visible on the milieu.  He is pleasant upon approach.  He rates his depression and hopelessness as a 6; anxiety as a 7.  The reports "cold sweats" as withdrawal symptoms.  He denies SI/HI/AVH.  His goal today is to "get spirits picked up, as in mood.  Be involved with groups.  Patient is interested in long term care. A: Continue to monitor medication management and MD orders.  Safety checks completed every 15 minutes per protocol.  Offer support and encouragement as needed. R: Patient's behavior is appropriate to situation.

## 2015-01-21 NOTE — Progress Notes (Signed)
D: Pt was laying in bed prior to the assessment. Pt still has depressed affect as well as a soft, slow tone. Stated he's seeking long term treatment. Pt encouraged to attend groups tomorrow.  A:  Support and encouragement was offered. 15 min checks continued for safety.  R: Pt remains safe.

## 2015-01-21 NOTE — Clinical Social Work Note (Signed)
Patient provided with contact information for several halfway houses including [facility] and [facility] and was encouraged to call.  Tilden Fossa, MSW, [place] Worker [facility] [phone]

## 2015-01-21 NOTE — Tx Team (Signed)
Interdisciplinary Treatment Plan Update (Adult) Date: 01/21/2015    Time Reviewed: 9:30 AM  Progress in Treatment: Attending groups: Continuing to assess, patient new to milieu Participating in groups: Continuing to assess, patient new to milieu Taking medication as prescribed: Yes Tolerating medication: Yes Family/Significant other contact made: No, CSW assessing for appropriate contacts Patient understands diagnosis: Yes Discussing patient identified problems/goals with staff: Yes Medical problems stabilized or resolved: Yes Denies suicidal/homicidal ideation: Yes Issues/concerns per patient self-inventory: Yes Other:  New problem(s) identified: N/A  Discharge Plan or Barriers: 01/21/2015:  Patient requesting referral to residential treatment. [facility] and [place] referrals pending.  Reason for Continuation of Hospitalization:  Depression Anxiety Medication Stabilization   Comments: N/A  Estimated length of stay: 3-5 days   [name] [name] is a 49yo male hospitalized with SI and HI with a plan, increased alcohol and drug abuse lately due to stressors with long-term girlfriend whom he has cared for since a stroke last year paralyzed her. Has been staying with her, considers himself homeless for the last 6-7 years. He does not have mental health providers, is asking for referral to long-term treatment. Went to [facility] a few years ago and says that would be his #1 choice, with [facility] a distant second. The patient would benefit from safety monitoring, medication evaluation, psychoeducation, group therapy, and discharge planning to link with ongoing resources.     Review of initial/current patient goals per problem list:  1. Goal(s): Patient will participate in aftercare plan   Met: No   Target date: 3-5 days post admission date   As evidenced by: Patient will participate within aftercare plan AEB aftercare provider and housing plan at discharge being identified.  01/21/2015: Goal  not met: CSW assessing for appropriate referrals for pt and will have follow up secured prior to d/c.    2. Goal (s): Patient will exhibit decreased depressive symptoms and suicidal ideations.   Met: No   Target date: 3-5 days post admission date   As evidenced by: Patient will utilize self rating of depression at 3 or below and demonstrate decreased signs of depression or be deemed stable for discharge by MD.  01/21/2015: Goal not met: Pt presents with flat affect and depressed mood.  Pt admitted with depression rating of 10.  Pt to show decreased sign of depression and a rating of 3 or less before d/c.       3. Goal(s): Patient will demonstrate decreased signs and symptoms of anxiety.   Met: No   Target date: 3-5 days post admission date   As evidenced by: Patient will utilize self rating of anxiety at 3 or below and demonstrated decreased signs of anxiety, or be deemed stable for discharge by MD  01/21/2015: Goal not met: Pt presents with anxious mood and affect.  Pt admitted with anxiety rating of 10.  Pt to show decreased sign of anxiety and a rating of 3 or less before d/c.    4. Goal(s): Patient will demonstrate decreased signs of withdrawal due to substance abuse   Met: Yes   Target date: 3-5 days post admission date   As evidenced by: Patient will produce a CIWA/COWS score of 0, have stable vitals signs, and no symptoms of withdrawal  01/21/2015: Goal met: No withdrawal symptoms reported at this time per medical chart.    Attendees: Patient:    Family:    Physician: Dr. Parke Poisson; Dr. Sabra Heck 01/21/2015 9:30 AM  Nursing: Mayra Neer, Festus Aloe, [place], [place] RN 01/21/2015  9:30 AM  Clinical Social Worker: Tilden Fossa,  LCSWA 01/21/2015 9:30 AM  Other: Peri Maris, Heather Smart, LCSWA 01/21/2015 9:30 AM  Other: Debarah Crape, [facility] 01/21/2015 9:30 AM  Other:  01/21/2015 9:30 AM  Other: Ave Filter, NP 01/21/2015 9:30 AM   Other:    Other:    Other:       Scribe for Treatment Team:  Tilden Fossa, MSW, [place] [phone]

## 2015-01-22 NOTE — Plan of Care (Signed)
Problem: Alteration in mood & ability to function due to Goal: STG-Patient will report withdrawal symptoms Outcome: Completed/Met Date Met:  01/22/15 Patient denies Withdrawal symptoms.  Patient also does not display any visible withdrawal symptoms

## 2015-01-22 NOTE — Progress Notes (Signed)
Pt did not attend NA group this evening.  

## 2015-01-22 NOTE — BHH Group Notes (Signed)
[place] LCSW Group Therapy 01/22/2015  1:15 PM Type of Therapy: Group Therapy Participation Level: Active  Participation Quality: Attentive, Sharing and Supportive  Affect: Depressed  Cognitive: Alert and Oriented  Insight: Developing/Improving and Engaged  Engagement in Therapy: Developing/Improving and Engaged  Modes of Intervention: Clarification, Confrontation, Discussion, Education, Exploration, Limit-setting, Orientation, Problem-solving, Rapport Building, Art therapist, Socialization and Support  Summary of Progress/Problems: The topic for group today was emotional regulation. This group focused on both positive and negative emotion identification and allowed group members to process ways to identify feelings, regulate negative emotions, and find healthy ways to manage internal/external emotions. Group members were asked to reflect on a time when their reaction to an emotion led to a negative outcome and explored how alternative responses using emotion regulation would have benefited them. Group members were also asked to discuss a time when emotion regulation was utilized when a negative emotion was experienced. Patient participated in discussion of emotional stressors, warning signs, and coping strategies. CSW reviewed cognitive triangle concept with group and patient participated in working through various scenarios.   Tilden Fossa, MSW, [place] Worker [facility] [phone]

## 2015-01-22 NOTE — Clinical Social Work Note (Signed)
CSW left voicemail for [facility] regarding bed availability.  CSW refaxed referral to [facility].  Tilden Fossa, MSW, [place] Worker [facility] [phone]

## 2015-01-22 NOTE — Progress Notes (Signed)
Recreation Therapy Notes  Date: 08.17.2016 Time: 9:30am Location: [address] Group room   Group Topic: Stress Management  Goal Area(s) Addresses:  Patient will actively participate in stress management techniques presented during session.   Behavioral Response: Did not attend.   Laureen Ochs Deaven Urwin, LRT/CTRS        Dishon Kehoe L 01/22/2015 3:03 PM

## 2015-01-22 NOTE — Plan of Care (Signed)
Problem: Alteration in mood Goal: STG-Patient reports thoughts of self-harm to staff Outcome: Progressing Patient denies SI and verbally contracts for safety

## 2015-01-22 NOTE — Progress Notes (Signed)
[facility] MD Progress Note  01/22/2015 5:03 PM [name] [name]  MRN:  [id number] Subjective:  [name] [name] states he is trying to get his life back together. He states he has taken advantage of so many times that he is having a hard time trusting. States he really needs to go to a residential treatment program. He states he went years ago and it really help him. States that is what he needs. He is hesitant to take medications that would be too sedating or would make him feel "weird" states the Seroquel he was given in the beginning caused a lot of side effects. He is OK with the [place]ters the way is is prescribed Principal Problem: Alcohol dependence with alcohol-induced mood disorder Diagnosis:   Patient Active Problem List   Diagnosis Date Noted  . Alcohol use disorder, severe, dependence [F10.20] 01/18/2015  . Alcohol dependence with alcohol-induced mood disorder [F10.24]   . Bipolar disorder [F31.9] 05/07/2014  . Suicidal ideation [R45.851]   . Major depressive episode [F32.9] 04/22/2014    Class: Acute  . Acute pancreatitis [K85.9] 03/20/2012  . Polysubstance abuse [F19.10] 03/20/2012  . Substance induced mood disorder [F19.94] 01/12/2012  . Cocaine abuse [F14.10] 01/12/2012  . Cannabis abuse [F12.10] 01/12/2012  . Alcohol dependence [F10.20] 10/15/2011  . Abnormal ECG [R94.31]   . Chest pain [R07.2]   . CAD in native artery [I25.10]   . GERD (gastroesophageal reflux disease) [K21.9]   . Tobacco abuse [Z72.0]   . History of cocaine abuse [F14.10]   . History of ETOH abuse [F10.10]   . Bradycardia [R00.1]   . Syncope [R55]    Total Time spent with patient: 30 minutes   Past Medical History:  Past Medical History  Diagnosis Date  . Chest pain, mid sternal   . CAD in native artery     a. Nonobstructive cath 11/2007;  b. Presented with ST elevation - Nonobstructive cath 08/2011  . GERD (gastroesophageal reflux disease)   . Tobacco abuse   . Marijuana abuse     a. uses ~ 1x /wk or less  .  History of cocaine abuse     a. quit ? 2009  . History of ETOH abuse     a. drinks 2 "40's" / wk  . Bradycardia     a. asymptomatic  . Bipolar disorder   . Syncope     a. 12/2010 - presumed to be vasovagal  . Abnormal ECG     a. early repolarization  . Coronary artery disease   . Pneumonia   . Stomach ulcer   . Arthritis     "my whole left side" (05/03/2014)    Past Surgical History  Procedure Laterality Date  . Cardiac catheterization  2009; 08/2011    [name] 08/06/2011  . Left heart catheterization with coronary angiogram N/A 08/16/2011    Procedure: LEFT HEART CATHETERIZATION WITH CORONARY ANGIOGRAM;  Surgeon: [name], MD;  Location: [facility] CATH LAB;  Service: Cardiovascular;  Laterality: N/A;   Family History: History reviewed. No pertinent family history. Social History:  History  Alcohol Use  . 28.8 oz/week  . 48 Cans of beer per week    Comment: 05/03/2014 "2 40oz beers/day"     History  Drug Use  . Yes  . Special: Marijuana, Cocaine    Comment: 05/03/2014 "last marijuana was ~ 3 days ago; last cocaine was in 2014"    Social History   Social History  . Marital Status: Single  Spouse Name: N/A  . Number of Children: N/A  . Years of Education: N/A   Social History Main Topics  . Smoking status: Current Every Day Smoker -- 0.50 packs/day for 20 years    Types: Cigarettes  . Smokeless tobacco: Never Used  . Alcohol Use: 28.8 oz/week    48 Cans of beer per week     Comment: 05/03/2014 "2 40oz beers/day"  . Drug Use: Yes    Special: Marijuana, Cocaine     Comment: 05/03/2014 "last marijuana was ~ 3 days ago; last cocaine was in 2014"  . Sexual Activity: Yes   Other Topics Concern  . None   Social History Narrative   Lives with his daughter currently and receives SSI.    Additional History:    Sleep: Fair  Appetite:  Fair   Assessment:   Musculoskeletal: Strength & Muscle Tone: within normal limits Gait & Station: normal Patient leans:  normal   Psychiatric Specialty Exam: Physical Exam  Review of Systems  Constitutional: Negative.   HENT: Negative.   Eyes: Negative.   Respiratory: Negative.   Cardiovascular: Negative.   Gastrointestinal: Negative.   Genitourinary: Negative.   Musculoskeletal: Negative.   Skin: Negative.   Neurological: Negative.   Endo/Heme/Allergies: Negative.   Psychiatric/Behavioral: Positive for depression and substance abuse. The patient is nervous/anxious.     Blood pressure 101/58, pulse 63, temperature 97.4 F (36.3 C), temperature source Oral, resp. rate 20, height 5\' 4"  (1.626 m), weight 58.514 kg (129 lb).Body mass index is 22.13 kg/(m^2).  General Appearance: Fairly Groomed  Engineer, water::  Fair  Speech:  Clear and Coherent  Volume:  Decreased  Mood:  Anxious and worried  Affect:  anxious worried  Thought Process:  Coherent and Goal Directed  Orientation:  Full (Time, Place, and Person)  Thought Content:  symptoms events worries concerns  Suicidal Thoughts:  No  Homicidal Thoughts:  No  Memory:  Immediate;   Fair Recent;   Fair Remote;   Fair  Judgement:  Fair  Insight:  Present and Shallow  Psychomotor Activity:  Restlessness  Concentration:  Fair  Recall:  [employer] of Knowledge:Fair  Language: Fair  Akathisia:  No  Handed:  Right  AIMS (if indicated):     Assets:  Desire for Improvement  ADL's:  Intact  Cognition: WNL  Sleep:  Number of Hours: 6.5     Current Medications: Current Facility-Administered Medications  Medication Dose Route Frequency Provider Last Rate Last Dose  . alum & mag hydroxide-simeth (MAALOX/MYLANTA) 200-200-20 MG/5ML suspension 30 mL  30 mL Oral Q4H PRN Benjamine Mola, FNP      . magnesium hydroxide (MILK OF MAGNESIA) suspension 30 mL  30 mL Oral Daily PRN Benjamine Mola, FNP      . multivitamin with minerals tablet 1 tablet  1 tablet Oral Daily Benjamine Mola, FNP   1 tablet at 01/22/15 0755  . pantoprazole (PROTONIX) EC tablet 40 mg   40 mg Oral Daily Encarnacion Slates, NP   40 mg at 01/22/15 0755  . thiamine (VITAMIN B-1) tablet 100 mg  100 mg Oral Daily Benjamine Mola, FNP   100 mg at 01/22/15 0755  . traZODone (DESYREL) tablet 100 mg  100 mg Oral QHS Nicholaus Bloom, MD   100 mg at 01/21/15 2140    Lab Results: No results found for this or any previous visit (from the past 48 hour(s)).  Physical Findings: AIMS: Facial and Oral  Movements Muscles of Facial Expression: None, normal Lips and Perioral Area: None, normal Jaw: None, normal Tongue: None, normal,Extremity Movements Upper (arms, wrists, hands, fingers): None, normal Lower (legs, knees, ankles, toes): None, normal, Trunk Movements Neck, shoulders, hips: None, normal, Overall Severity Severity of abnormal movements (highest score from questions above): None, normal Incapacitation due to abnormal movements: None, normal Patient's awareness of abnormal movements (rate only patient's report): No Awareness,    CIWA:  CIWA-Ar Total: 0 COWS:     Treatment Plan Summary: Daily contact with patient to assess and evaluate symptoms and progress in treatment and Medication management Supportive approach/coping skills Alcohol dependence; continue to work a relapse prevention plan Mood instability; reassess for the needs of psychotropic agents CBT/mindfulness Explore residential treatment options  Medical Decision Making:  Review of Psycho-Social Stressors (1) and Review of Medication Regimen & Side Effects (2)     [name] [name] A 01/22/2015, 5:03 PM

## 2015-01-22 NOTE — Progress Notes (Signed)
D: Patient in the dayroom on approach.  Patient states his day started off bad but ut got better as the day progressed.  Patient states, "My goal was to pick my spirits up, and thank God for today."  Patient states he has been eating well.  Patient denies SI/HI and denies AVH.   A: Staff to monitor Q 15 mins for safety.  Encouragement and support offered.  Scheduled medications administered per orders. R: Patient remains safe on the unit.  Patient did not attend group tonight.  Patient visible on the unit briefly tonight.  Patient taking administered medications.

## 2015-01-22 NOTE — Progress Notes (Signed)
D: Patient visible in the milieu. He has been attending some groups today.  He is awaiting a bed at [facility] or [facility].  Patient denies SI/HI/AVH.  Patient reports minimal withdrawal symptoms. A: Continue to monitor medication management and MD orders.  Safety checks completed every 15 minutes per protocol.  Offer support and encouragement as needed. R: Patient's behavior is appropriate to situation.

## 2015-01-22 NOTE — Progress Notes (Signed)
D: Pt stated, "feeling a little good. Had a down slope today, more like depression." Pt explained a dream involving his deceased older sister. Stated he was disappointed when he woke up.  A:  Support and encouragement was offered. 15 min checks continued for safety.  R: Pt remains safe.

## 2015-01-22 NOTE — BHH Group Notes (Signed)
   [facility] LCSW Aftercare Discharge Planning Group Note  01/22/2015  8:45 AM   Participation Quality: Alert, Appropriate and Oriented  Mood/Affect: Depressed   Depression Rating: 5  Anxiety Rating: Reports high anxiety today  Thoughts of Suicide: Pt denies SI/HI  Will you contract for safety? Yes  Current AVH: Pt denies  Plan for Discharge/Comments: Pt attended discharge planning group and actively participated in group. CSW provided pt with today's workbook. Patient reports feeling "very tired" today and states that he is worried about where he will go at discharge. CSW encouraged patient to contact [facility] today.  Transportation Means: Pt reports access to transportation  Supports: No supports mentioned at this time  Tilden Fossa, MSW, [place] Social Worker [employer] [phone]

## 2015-01-23 NOTE — BHH Group Notes (Addendum)
[place] Group Notes:  (Nursing/MHT/Case Management/Adjunct)  Date:  01/23/2015  Time:  12:54 PM  Type of Therapy:  Nurse Education / Leisure Activity The group is focused on teaching patients the importance of incorporating leisure activities into their lives daily and how these can impact their recovery positively.   Participation Level: Patient did not attend.   Participation Quality:  N/A  Affect:  N/A   Cognitive:  N/A  Insight:  N/A  Engagement in Group:  N/A  Modes of Intervention:  N/A  Summary of Progress/Problems:  Lauralyn Primes 01/23/2015, 12:54 PM

## 2015-01-23 NOTE — Tx Team (Signed)
Interdisciplinary Treatment Plan Update (Adult) Date: 01/23/2015    Time Reviewed: 9:30 AM  Progress in Treatment: Attending groups: Yes Participating in groups: Yes Taking medication as prescribed: Yes Tolerating medication: Yes Family/Significant other contact made: No, CSW assessing for appropriate contacts Patient understands diagnosis: Yes Discussing patient identified problems/goals with staff: Yes Medical problems stabilized or resolved: Yes Denies suicidal/homicidal ideation: Yes Issues/concerns per patient self-inventory: Yes Other:  New problem(s) identified: N/A  Discharge Plan or Barriers: 8/16:  Patient requesting referral to residential treatment. [facility] and [place] referrals pending. 8/18:  Patient requesting referral to residential treatment. [facility] and [place] referrals pending. Patient has also been provided with listing for local [facility] and encouraged to call.  Reason for Continuation of Hospitalization:  Depression Anxiety Medication Stabilization   Comments: N/A  Estimated length of stay: 1-2 days   [name] [name] is a 49yo male hospitalized with SI and HI with a plan, increased alcohol and drug abuse lately due to stressors with long-term girlfriend whom he has cared for since a stroke last year paralyzed her. Has been staying with her, considers himself homeless for the last 6-7 years. He does not have mental health providers, is asking for referral to long-term treatment. Went to [facility] a few years ago and says that would be his #1 choice, with [facility] a distant second. The patient would benefit from safety monitoring, medication evaluation, psychoeducation, group therapy, and discharge planning to link with ongoing resources.     Review of initial/current patient goals per problem list:  1. Goal(s): Patient will participate in aftercare plan   Met: No   Target date: 3-5 days post admission date   As evidenced by: Patient will participate within  aftercare plan AEB aftercare provider and housing plan at discharge being identified.  8/16: Goal not met: CSW assessing for appropriate referrals for pt and will have follow up secured prior to d/c. 8/18: [facility] and [facility] referrals pending. Patient has also been provided with [facility] listing.     2. Goal (s): Patient will exhibit decreased depressive symptoms and suicidal ideations.   Met: Goal Progressing   Target date: 3-5 days post admission date   As evidenced by: Patient will utilize self rating of depression at 3 or below and demonstrate decreased signs of depression or be deemed stable for discharge by MD.  8/16: Goal not met: Pt presents with flat affect and depressed mood.  Pt admitted with depression rating of 10.  Pt to show decreased sign of depression and a rating of 3 or less before d/c.   8/17: Patient rates depression at 5 today and denies SI.     3. Goal(s): Patient will demonstrate decreased signs and symptoms of anxiety.   Met: Goal Progressing   Target date: 3-5 days post admission date   As evidenced by: Patient will utilize self rating of anxiety at 3 or below and demonstrated decreased signs of anxiety, or be deemed stable for discharge by MD  8/16: Goal not met: Pt presents with anxious mood and affect.  Pt admitted with anxiety rating of 10.  Pt to show decreased sign of anxiety and a rating of 3 or less before d/c. 8/17: Patient reports high anxiety related to his discharge plans.    4. Goal(s): Patient will demonstrate decreased signs of withdrawal due to substance abuse   Met: Yes   Target date: 3-5 days post admission date   As evidenced by: Patient will produce a CIWA/COWS score of 0, have  stable vitals signs, and no symptoms of withdrawal  8/16: Goal met: No withdrawal symptoms reported at this time per medical chart.    Attendees: Patient:    Family:    Physician: Dr. Parke Poisson; Dr. Sabra Heck 01/23/2015 9:30 AM  Nursing: Mayra Neer, Patty Duke, Fredda Hammed [facility] 01/23/2015 9:30 AM  Clinical Social Worker: Tilden Fossa,  [place] 01/23/2015 9:30 AM  Other: Peri Maris, [place], LCSWA 01/23/2015 9:30 AM  Other:  01/23/2015 9:30 AM  Other:  01/23/2015 9:30 AM  Other: Samuel Jester, NP 01/23/2015 9:30 AM  Other:    Other:    Other:      Scribe for Treatment Team:  Tilden Fossa, MSW, [place] [phone]

## 2015-01-23 NOTE — Progress Notes (Signed)
D: Patient states he is feeling "great", rating all of his symptoms as a 0.  He is sleeping and eating well.  He is attending groups with minimal participation.  He denies SI/HI/AVH.  Patient's goal is to "keep my spirits high."  Patient is awaiting placement at [facility] or [facility]. A: Continue to monitor medication management and MD orders.  Safety checks completed every 15 minutes per protocol.  Offer support and encouragement as needed. R: Patient's behavior is appropriate to situation.

## 2015-01-23 NOTE — Progress Notes (Signed)
[facility] MD Progress Note  01/23/2015 4:27 PM [name] [name]  MRN:  [id number] Subjective:  [name] [name] states he is dealing with "bumps in the road." states that his mood takes dives and he does not know why. But he tries to get away and be alone until it gets better. He is still wanting to go to a residential treatment program. He is afraid that if he does not he is not going to be able to make it. States he is really concerned about his health  Principal Problem: Alcohol dependence with alcohol-induced mood disorder Diagnosis:   Patient Active Problem List   Diagnosis Date Noted  . Alcohol use disorder, severe, dependence [F10.20] 01/18/2015  . Alcohol dependence with alcohol-induced mood disorder [F10.24]   . Bipolar disorder [F31.9] 05/07/2014  . Suicidal ideation [R45.851]   . Major depressive episode [F32.9] 04/22/2014    Class: Acute  . Acute pancreatitis [K85.9] 03/20/2012  . Polysubstance abuse [F19.10] 03/20/2012  . Substance induced mood disorder [F19.94] 01/12/2012  . Cocaine abuse [F14.10] 01/12/2012  . Cannabis abuse [F12.10] 01/12/2012  . Alcohol dependence [F10.20] 10/15/2011  . Abnormal ECG [R94.31]   . Chest pain [R07.2]   . CAD in native artery [I25.10]   . GERD (gastroesophageal reflux disease) [K21.9]   . Tobacco abuse [Z72.0]   . History of cocaine abuse [F14.10]   . History of ETOH abuse [F10.10]   . Bradycardia [R00.1]   . Syncope [R55]    Total Time spent with patient: 20 minutes   Past Medical History:  Past Medical History  Diagnosis Date  . Chest pain, mid sternal   . CAD in native artery     a. Nonobstructive cath 11/2007;  b. Presented with ST elevation - Nonobstructive cath 08/2011  . GERD (gastroesophageal reflux disease)   . Tobacco abuse   . Marijuana abuse     a. uses ~ 1x /wk or less  . History of cocaine abuse     a. quit ? 2009  . History of ETOH abuse     a. drinks 2 "40's" / wk  . Bradycardia     a. asymptomatic  . Bipolar disorder   .  Syncope     a. 12/2010 - presumed to be vasovagal  . Abnormal ECG     a. early repolarization  . Coronary artery disease   . Pneumonia   . Stomach ulcer   . Arthritis     "my whole left side" (05/03/2014)    Past Surgical History  Procedure Laterality Date  . Cardiac catheterization  2009; 08/2011    [name] 08/06/2011  . Left heart catheterization with coronary angiogram N/A 08/16/2011    Procedure: LEFT HEART CATHETERIZATION WITH CORONARY ANGIOGRAM;  Surgeon: [name], MD;  Location: [facility] CATH [facility];  Service: Cardiovascular;  Laterality: N/A;   Family History: History reviewed. No pertinent family history. Social History:  History  Alcohol Use  . 28.8 oz/week  . 48 Cans of beer per week    Comment: 05/03/2014 "2 40oz beers/day"     History  Drug Use  . Yes  . Special: Marijuana, Cocaine    Comment: 05/03/2014 "last marijuana was ~ 3 days ago; last cocaine was in 2014"    Social History   Social History  . Marital Status: Single    Spouse Name: N/A  . Number of Children: N/A  . Years of Education: N/A   Social History Main Topics  . Smoking status: Current Every  Day Smoker -- 0.50 packs/day for 20 years    Types: Cigarettes  . Smokeless tobacco: Never Used  . Alcohol Use: 28.8 oz/week    48 Cans of beer per week     Comment: 05/03/2014 "2 40oz beers/day"  . Drug Use: Yes    Special: Marijuana, Cocaine     Comment: 05/03/2014 "last marijuana was ~ 3 days ago; last cocaine was in 2014"  . Sexual Activity: Yes   Other Topics Concern  . None   Social History Narrative   Lives with his daughter currently and receives SSI.    Additional History:    Sleep: Poor  Appetite:  Fair   Assessment:   Musculoskeletal: Strength & Muscle Tone: within normal limits Gait & Station: normal Patient leans: normal   Psychiatric Specialty Exam: Physical Exam  Review of Systems  Constitutional: Negative.   HENT: Negative.   Eyes: Negative.   Respiratory:  Negative.   Cardiovascular: Negative.   Gastrointestinal: Negative.   Genitourinary: Negative.   Musculoskeletal: Negative.   Skin: Negative.   Neurological: Negative.   Endo/Heme/Allergies: Negative.   Psychiatric/Behavioral: Positive for depression and substance abuse. The patient is nervous/anxious.     Blood pressure 93/59, pulse 66, temperature 97.3 F (36.3 C), temperature source Oral, resp. rate 16, height 5\' 4"  (1.626 m), weight 58.514 kg (129 lb).Body mass index is 22.13 kg/(m^2).  General Appearance: Fairly Groomed  Engineer, water::  Fair  Speech:  Clear and Coherent  Volume:  Decreased  Mood:  Depressed  Affect:  Restricted  Thought Process:  Coherent and Goal Directed  Orientation:  Full (Time, Place, and Person)  Thought Content:  symptoms events worries concerns  Suicidal Thoughts:  No  Homicidal Thoughts:  No  Memory:  Immediate;   Fair Recent;   Fair Remote;   Fair  Judgement:  Fair  Insight:  Present  Psychomotor Activity:  Restlessness  Concentration:  Fair  Recall:  [employer] of Knowledge:Fair  Language: Fair  Akathisia:  No  Handed:  Right  AIMS (if indicated):     Assets:  Desire for Improvement  ADL's:  Intact  Cognition: WNL  Sleep:  Number of Hours: 6.5     Current Medications: Current Facility-Administered Medications  Medication Dose Route Frequency Provider Last Rate Last Dose  . alum & mag hydroxide-simeth (MAALOX/MYLANTA) 200-200-20 MG/5ML suspension 30 mL  30 mL Oral Q4H PRN Benjamine Mola, FNP      . magnesium hydroxide (MILK OF MAGNESIA) suspension 30 mL  30 mL Oral Daily PRN Benjamine Mola, FNP      . multivitamin with minerals tablet 1 tablet  1 tablet Oral Daily Benjamine Mola, FNP   1 tablet at 01/23/15 0804  . pantoprazole (PROTONIX) EC tablet 40 mg  40 mg Oral Daily Encarnacion Slates, NP   40 mg at 01/23/15 0804  . thiamine (VITAMIN B-1) tablet 100 mg  100 mg Oral Daily Benjamine Mola, FNP   100 mg at 01/23/15 0804  . traZODone  (DESYREL) tablet 100 mg  100 mg Oral QHS Nicholaus Bloom, MD   100 mg at 01/22/15 2158    Lab Results: No results found for this or any previous visit (from the past 48 hour(s)).  Physical Findings: AIMS: Facial and Oral Movements Muscles of Facial Expression: None, normal Lips and Perioral Area: None, normal Jaw: None, normal Tongue: None, normal,Extremity Movements Upper (arms, wrists, hands, fingers): None, normal Lower (legs, knees, ankles, toes):  None, normal, Trunk Movements Neck, shoulders, hips: None, normal, Overall Severity Severity of abnormal movements (highest score from questions above): None, normal Incapacitation due to abnormal movements: None, normal Patient's awareness of abnormal movements (rate only patient's report): No Awareness,    CIWA:  CIWA-Ar Total: 0 COWS:     Treatment Plan Summary: Daily contact with patient to assess and evaluate symptoms and progress in treatment and Medication management Supportive approach/coping skills Alcohol dependence: continue to work a relapse prevention plan Mood instability; reassess for the need to start psychotropics CBT/mindfulness Continue to explore residential treatment programs Medical Decision Making:  Review of Psycho-Social Stressors (1) and Review of Medication Regimen & Side Effects (2)     Nyko Gell A 01/23/2015, 4:27 PM

## 2015-01-23 NOTE — BHH Group Notes (Signed)
[place] LCSW Group Therapy 01/23/2015 1:15 PM Type of Therapy: Group Therapy Participation Level: Active  Participation Quality: Attentive, Sharing and Supportive  Affect: Appropriate  Cognitive: Alert and Oriented  Insight: Developing/Improving and Engaged  Engagement in Therapy: Developing/Improving and Engaged  Modes of Intervention: Activity, Clarification, Confrontation, Discussion, Education, Exploration, Limit-setting, Orientation, Problem-solving, Rapport Building, Art therapist, Socialization and Support  Summary of Progress/Problems: Patient was attentive and engaged with speaker from [place]. Patient was attentive to speaker while they shared their story of dealing with mental health and overcoming it. Patient expressed interest in their programs and services and received information on their agency. Patient processed ways they can relate to the speaker.   Tilden Fossa, MSW, [place] Worker [facility] [phone]

## 2015-01-23 NOTE — Progress Notes (Signed)
Patient attended Karaoke group tonight.

## 2015-01-23 NOTE — Progress Notes (Signed)
D: Patient in the dayroom on approach.  Patient states he had a so, so day today.  Patient states he was sad today.  Patient states he has been thinking about his situation and his plan after discharge.  Patient did appears sad and depressed.  Patient denies SI/HI and denies AVH.     A: Staff to monitor Q 15 mins for safety.  Encouragement and support offered.  Scheduled medications administered per orders. R: Patient remains safe on the unit.  Patient attended group tonight. Patient visible on the unit and interacting with peers.  Patient taking administered medications.

## 2015-01-24 MED ORDER — ENSURE ENLIVE PO LIQD
237.0000 mL | Freq: Three times a day (TID) | ORAL | Status: DC
  Administered 2015-01-24 – 2015-01-26 (×8): 237 mL via ORAL

## 2015-01-24 NOTE — Progress Notes (Signed)
D . Pt pleasant on approach, denies complaints at this time.  Pleased that sleep medication dosage was raised and hopeful he will rest well tonight as he states he did not last night.  Denies SI/HI/hallucinations at this time.  Positive for evening wrap up group, interacting appropriately with peers on the unit.  A.  Support and encouragement offered  R.  Pt remains safe on unit, will continue to monitor.

## 2015-01-24 NOTE — BHH Group Notes (Signed)
[place] LCSW Group Therapy  01/24/2015 1:17 PM  Type of Therapy:  Group Therapy  Participation Level:  Minimal  Participation Quality:  Attentive  Affect:  Appropriate  Cognitive:  Alert and Oriented  Insight:  Improving  Engagement in Therapy:  Improving  Modes of Intervention:  Confrontation, Discussion, Education, Exploration, Problem-solving, Rapport Building, Socialization and Support  Summary of Progress/Problems: Feelings around Relapse. Group members discussed the meaning of relapse and shared personal stories of relapse, how it affected them and others, and how they perceived themselves during this time. Group members were encouraged to identify triggers, warning signs and coping skills used when facing the possibility of relapse. Social supports were discussed and explored in detail. Post Acute Withdrawal Syndrome (handout provided) was introduced and examined. Pt's were encouraged to ask questions, talk about key points associated with PAWS, and process this information in terms of relapse prevention. [name] [name] was attentive and engaged during today's processing group. He shared that he [facility] and learn how to cope with depression without relapsing. Pt reports that it is scary but he feels up for the challenge and finds support in his family and in staff at [facility].   Smart, [name] [name] LCSWA 01/24/2015, 1:17 PM

## 2015-01-24 NOTE — Progress Notes (Signed)
D: Patient is A&Ox4, denies SI/HI and A/V hallucinations. 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 Patient also spoke about not taking care of himself because he was taking care of her. Patient stated he hasn't taken his depression or heart medications in years because of this. Patient stated his heart medication was for low heart rate and possibly low BP. Patient rates his depression at a 0, hopelessness at a 0, and anxiety at a 4.  A: Patient given medications as scheduled and prn. Emotional support given as needed. Continue monitoring patient q15 minutes and prn for safety. R: patient remains safe. Patient verbalized understanding to let staff know if he no longer feels safe or if anything changes.  Demetruis Depaul, [place], RN

## 2015-01-24 NOTE — Tx Team (Signed)
Interdisciplinary Treatment Plan Update (Adult) Date: 01/24/2015    Time Reviewed: 9:30 AM  Progress in Treatment: Attending groups: Yes Participating in groups: Yes Taking medication as prescribed: Yes Tolerating medication: Yes Family/Significant other contact made: Pt refused. SPE completed with pt.  Patient understands diagnosis: Yes Discussing patient identified problems/goals with staff: Yes Medical problems stabilized or resolved: Yes Denies suicidal/homicidal ideation: Yes Issues/concerns per patient self-inventory: Yes Other:  New problem(s) identified: N/A  Discharge Plan or Barriers: 8/16:  Patient requesting referral to residential treatment. [facility] and [place] referrals pending. 8/18:  Patient requesting referral to residential treatment. [facility] and [place] referrals pending. Patient has also been provided with listing for local [place] houses and encouraged to call. 8/19: Monday morning 8am screening at [facility].   Reason for Continuation of Hospitalization:  Medication management   Comments: N/A  Estimated length of stay: 2 days (Monday morning at 6:30AM)  [name] [name] is a 49yo male hospitalized with SI and HI with a plan, increased alcohol and drug abuse lately due to stressors with long-term girlfriend whom he has cared for since a stroke last year paralyzed her. Has been staying with her, considers himself homeless for the last 6-7 years. He does not have mental health providers, is asking for referral to long-term treatment. Went to [facility] a few years ago and says that would be his #1 choice, with [facility] a distant second. The patient would benefit from safety monitoring, medication evaluation, psychoeducation, group therapy, and discharge planning to link with ongoing resources.  Review of initial/current patient goals per problem list:  1. Goal(s): Patient will participate in aftercare plan   Met: Yes   Target date: 3-5 days post admission date   As evidenced  by: Patient will participate within aftercare plan AEB aftercare provider and housing plan at discharge being identified.  8/16: Goal not met: CSW assessing for appropriate referrals for pt and will have follow up secured prior to d/c. 8/18: [facility] and [facility] referrals pending. Patient has also been provided with [facility] listing. 8/19: Goal met. No [facility] beds available. [facility]-screening 8am Monday.   2. Goal (s): Patient will exhibit decreased depressive symptoms and suicidal ideations.   Met: Goal met.    Target date: 3-5 days post admission date   As evidenced by: Patient will utilize self rating of depression at 3 or below and demonstrate decreased signs of depression or be deemed stable for discharge by MD.  8/16: Goal not met: Pt presents with flat affect and depressed mood.  Pt admitted with depression rating of 10.  Pt to show decreased sign of depression and a rating of 3 or less before d/c.   8/17: Patient rates depression at 5 today and denies SI. 8/19: Pt rates depression as 2/10.   3. Goal(s): Patient will demonstrate decreased signs and symptoms of anxiety.   Met: Goal met    Target date: 3-5 days post admission date   As evidenced by: Patient will utilize self rating of anxiety at 3 or below and demonstrated decreased signs of anxiety, or be deemed stable for discharge by MD  8/16: Goal not met: Pt presents with anxious mood and affect.  Pt admitted with anxiety rating of 10.  Pt to show decreased sign of anxiety and a rating of 3 or less before d/c. 8/17: Patient reports high anxiety related to his discharge plans. 8/19: Pt rates anxiety as 0/10 and presents with pleasant mood/calm affect.   4. Goal(s): Patient will demonstrate decreased signs of  withdrawal due to substance abuse   Met: Yes   Target date: 3-5 days post admission date   As evidenced by: Patient will produce a CIWA/COWS score of 0, have stable vitals signs, and no symptoms of  withdrawal  8/16: Goal met: No withdrawal symptoms reported at this time per medical chart.    Attendees: Patient:    Family:    Physician: Dr. Parke Poisson; Dr. Sabra Heck 01/24/2015 10:20 AM   Nursing: Gaspar Cola RN   Clinical Social Worker: Tilden Fossa,  LCSWA   Other:  Maxie Better, LCSWA   Other:    Other:    Other: Samuel Jester, NP   Other:    Other:    Other:      Scribe for Treatment Team:  Maxie Better, [place] 01/24/2015 10:21 AM

## 2015-01-24 NOTE — Clinical Social Work Note (Signed)
Pt's nephew agreed to pick him up between 7am-7:15am on Monday to transport to [facility]. Bus pass in chart for pt to get home from [facility] if he is not directly admitted.   Maxie Better, LCSWA Clinical Social Worker 01/24/2015 3:46 PM

## 2015-01-24 NOTE — Progress Notes (Signed)
Tonight in wrap up group [name] [name] stated that his day was a 6 he is going to be discharged on Monday and is looking forward to starting his long-term program at [facility] and hopefully afterwards starting outpatient with them to find housing and a program that works for him.

## 2015-01-24 NOTE — BHH Group Notes (Signed)
[facility] LCSW Aftercare Discharge Planning Group Note   01/24/2015 10:15 AM  Participation Quality:  Appropriate   Mood/Affect:  Appropriate  Depression Rating:  2  Anxiety Rating:  0  Thoughts of Suicide:  No Will you contract for safety?   NA  Current AVH:  No  Plan for Discharge/Comments:  Pt reports that he [facility]. No withdrawals, fair sleep.   Transportation Means: bus  Supports: none identified   Proofreader, Research officer, trade union

## 2015-01-24 NOTE — Progress Notes (Signed)
  [facility] Adult Case Management Discharge Plan :  Will you be returning to the same living situation after discharge:  No.screening Monday at 8am at [facility] for possible admission.  At discharge, do you have transportation home?: Yes,  bus passPT MUST DISCHARGE NO LATER THAN 6:45AM ON Monday MORNING FOR SCREENING AT [place].  Do you have the ability to pay for your medications: Yes,  mental health  Release of information consent forms completed and submitted to medical records by CSW.  Patient to Follow up at: Follow-up Information    Follow up with [facility] On 01/27/2015.   Why:  Screening appointment for possible admission on Monday August 22nd at 8am. Please bring your [place] ID, 30 day supply of medications, and clothing. Call office if you need to reschedule.   Contact information:   [address] [place], [place] [address] [phone]      Follow up with [facility]   Why:  Walk in between 8am-9am Monday through Friday for hospital follow-up/medication management/assesment for therapy services.    Contact information:   [address], [place] [address] Phone: [phone]       Patient denies SI/HI: Yes,  during group/self report.     Safety Planning and Suicide Prevention discussed: Yes,  SPE completed with pt. SPI pamphlet provided to pt and he was encouraged to share information with support network, ask questions, and talk about any concerns related to SPE.  Have you used any form of tobacco in the last 30 days? (Cigarettes, Smokeless Tobacco, Cigars, and/or Pipes): No  Has patient been referred to the Quitline?: N/A patient is not a smoker  Smart, [place] 01/24/2015, 10:23 AM

## 2015-01-24 NOTE — Progress Notes (Signed)
[facility] MD Progress Note  01/24/2015 3:04 PM [name] [name]  MRN:  [id number] Subjective:  [name] [name] states he is committed to go to rehab. He endorses feeling down not having much of an appetite. He is states this is the way it has always been for him. He is very grateful for the help we are giving him.  Principal Problem: Alcohol dependence with alcohol-induced mood disorder Diagnosis:   Patient Active Problem List   Diagnosis Date Noted  . Alcohol use disorder, severe, dependence [F10.20] 01/18/2015  . Alcohol dependence with alcohol-induced mood disorder [F10.24]   . Bipolar disorder [F31.9] 05/07/2014  . Suicidal ideation [R45.851]   . Major depressive episode [F32.9] 04/22/2014    Class: Acute  . Acute pancreatitis [K85.9] 03/20/2012  . Polysubstance abuse [F19.10] 03/20/2012  . Substance induced mood disorder [F19.94] 01/12/2012  . Cocaine abuse [F14.10] 01/12/2012  . Cannabis abuse [F12.10] 01/12/2012  . Alcohol dependence [F10.20] 10/15/2011  . Abnormal ECG [R94.31]   . Chest pain [R07.2]   . CAD in native artery [I25.10]   . GERD (gastroesophageal reflux disease) [K21.9]   . Tobacco abuse [Z72.0]   . History of cocaine abuse [F14.10]   . History of ETOH abuse [F10.10]   . Bradycardia [R00.1]   . Syncope [R55]    Total Time spent with patient: 30 minutes   Past Medical History:  Past Medical History  Diagnosis Date  . Chest pain, mid sternal   . CAD in native artery     a. Nonobstructive cath 11/2007;  b. Presented with ST elevation - Nonobstructive cath 08/2011  . GERD (gastroesophageal reflux disease)   . Tobacco abuse   . Marijuana abuse     a. uses ~ 1x /wk or less  . History of cocaine abuse     a. quit ? 2009  . History of ETOH abuse     a. drinks 2 "40's" / wk  . Bradycardia     a. asymptomatic  . Bipolar disorder   . Syncope     a. 12/2010 - presumed to be vasovagal  . Abnormal ECG     a. early repolarization  . Coronary artery disease   . Pneumonia   .  Stomach ulcer   . Arthritis     "my whole left side" (05/03/2014)    Past Surgical History  Procedure Laterality Date  . Cardiac catheterization  2009; 08/2011    [name] 08/06/2011  . Left heart catheterization with coronary angiogram N/A 08/16/2011    Procedure: LEFT HEART CATHETERIZATION WITH CORONARY ANGIOGRAM;  Surgeon: [name], MD;  Location: [facility] CATH LAB;  Service: Cardiovascular;  Laterality: N/A;   Family History: History reviewed. No pertinent family history. Social History:  History  Alcohol Use  . 28.8 oz/week  . 48 Cans of beer per week    Comment: 05/03/2014 "2 40oz beers/day"     History  Drug Use  . Yes  . Special: Marijuana, Cocaine    Comment: 05/03/2014 "last marijuana was ~ 3 days ago; last cocaine was in 2014"    Social History   Social History  . Marital Status: Single    Spouse Name: N/A  . Number of Children: N/A  . Years of Education: N/A   Social History Main Topics  . Smoking status: Current Every Day Smoker -- 0.50 packs/day for 20 years    Types: Cigarettes  . Smokeless tobacco: Never Used  . Alcohol Use: 28.8 oz/week    48  Cans of beer per week     Comment: 05/03/2014 "2 40oz beers/day"  . Drug Use: Yes    Special: Marijuana, Cocaine     Comment: 05/03/2014 "last marijuana was ~ 3 days ago; last cocaine was in 2014"  . Sexual Activity: Yes   Other Topics Concern  . None   Social History Narrative   Lives with his daughter currently and receives SSI.    Additional History:    Sleep: Fair  Appetite:  Poor   Assessment:   Musculoskeletal: Strength & Muscle Tone: within normal limits Gait & Station: normal Patient leans: normal   Psychiatric Specialty Exam: Physical Exam  Review of Systems  HENT: Negative.   Eyes: Negative.   Respiratory: Negative.   Cardiovascular: Negative.   Gastrointestinal: Negative.   Genitourinary: Negative.   Musculoskeletal: Negative.   Skin: Negative.   Neurological: Positive for  weakness.  Endo/Heme/Allergies: Negative.   Psychiatric/Behavioral: Positive for substance abuse. The patient is nervous/anxious.     Blood pressure 99/59, pulse 82, temperature 97.9 F (36.6 C), temperature source Oral, resp. rate 14, height 5\' 4"  (1.626 m), weight 58.514 kg (129 lb).Body mass index is 22.13 kg/(m^2).  General Appearance: Fairly Groomed  Engineer, water::  Fair  Speech:  Clear and Coherent and Slow  Volume:  Decreased  Mood:  Anxious and Depressed  Affect:  Restricted  Thought Process:  Coherent and Goal Directed  Orientation:  Full (Time, Place, and Person)  Thought Content:  symptoms events worries concerns  Suicidal Thoughts:  No  Homicidal Thoughts:  No  Memory:  Immediate;   Fair Recent;   Fair Remote;   Fair  Judgement:  Fair  Insight:  Present and Shallow  Psychomotor Activity:  Restlessness  Concentration:  Fair  Recall:  [employer] of Knowledge:Fair  Language: Fair  Akathisia:  No  Handed:  Right  AIMS (if indicated):     Assets:  Desire for Improvement  ADL's:  Intact  Cognition: WNL  Sleep:  Number of Hours: 6.5     Current Medications: Current Facility-Administered Medications  Medication Dose Route Frequency Provider Last Rate Last Dose  . alum & mag hydroxide-simeth (MAALOX/MYLANTA) 200-200-20 MG/5ML suspension 30 mL  30 mL Oral Q4H PRN Benjamine Mola, FNP      . feeding supplement (ENSURE ENLIVE) (ENSURE ENLIVE) liquid 237 mL  237 mL Oral TID BM Nicholaus Bloom, MD      . magnesium hydroxide (MILK OF MAGNESIA) suspension 30 mL  30 mL Oral Daily PRN Benjamine Mola, FNP      . multivitamin with minerals tablet 1 tablet  1 tablet Oral Daily Benjamine Mola, FNP   1 tablet at 01/24/15 [phone]  . pantoprazole (PROTONIX) EC tablet 40 mg  40 mg Oral Daily Encarnacion Slates, NP   40 mg at 01/24/15 7673  . thiamine (VITAMIN B-1) tablet 100 mg  100 mg Oral Daily Benjamine Mola, FNP   100 mg at 01/24/15 4193  . traZODone (DESYREL) tablet 100 mg  100 mg Oral QHS  Nicholaus Bloom, MD   100 mg at 01/23/15 2141    Lab Results: No results found for this or any previous visit (from the past 48 hour(s)).  Physical Findings: AIMS: Facial and Oral Movements Muscles of Facial Expression: None, normal Lips and Perioral Area: None, normal Jaw: None, normal Tongue: None, normal,Extremity Movements Upper (arms, wrists, hands, fingers): None, normal Lower (legs, knees, ankles, toes): None, normal, Trunk  Movements Neck, shoulders, hips: None, normal, Overall Severity Severity of abnormal movements (highest score from questions above): None, normal Incapacitation due to abnormal movements: None, normal Patient's awareness of abnormal movements (rate only patient's report): No Awareness,    CIWA:  CIWA-Ar Total: 0 COWS:     Treatment Plan Summary: Daily contact with patient to assess and evaluate symptoms and progress in treatment and Medication management Supportive approach/coping skills/relapse prevention Alcohol dependence/continue to work a relapse prevention plan Mood instability; will continue to reassess for a mood stabilizer/antidepressant. He prefers not to be taking any psychotropics Lack of appetite; will go ahead and add Ensure Will facilitate admission to [facility] on Monday AM Note; high risk for relapse if he does not go door to door. Has no family no support he is homeless and does not have significant coping skills, very concrete   Medical Decision Making:  Review of Psycho-Social Stressors (1) and Review of Medication Regimen & Side Effects (2)     Gatha Mcnulty A 01/24/2015, 3:04 PM

## 2015-01-24 NOTE — Progress Notes (Signed)
Patient did not attend [facility] psychoeducational group this morning.

## 2015-01-25 MED ORDER — LIDOCAINE 5 % EX PTCH
1.0000 | MEDICATED_PATCH | Freq: Every day | CUTANEOUS | Status: DC
  Administered 2015-01-25 – 2015-01-26 (×2): 1 via TRANSDERMAL
  Filled 2015-01-25: qty 3
  Filled 2015-01-25 (×3): qty 1

## 2015-01-25 MED ORDER — METHOCARBAMOL 500 MG PO TABS
500.0000 mg | ORAL_TABLET | Freq: Three times a day (TID) | ORAL | Status: DC
  Administered 2015-01-25 – 2015-01-26 (×5): 500 mg via ORAL
  Filled 2015-01-25 (×3): qty 1
  Filled 2015-01-25: qty 9
  Filled 2015-01-25: qty 1
  Filled 2015-01-25: qty 9
  Filled 2015-01-25 (×3): qty 1
  Filled 2015-01-25: qty 9
  Filled 2015-01-25 (×2): qty 1

## 2015-01-25 NOTE — Progress Notes (Signed)
Psychoeducational Group Note  Date: 01/25/2015 Time: 1015  Group Topic/Focus:  Identifying Needs:   The focus of this group is to help patients identify their personal needs that have been historically problematic and identify healthy behaviors to address their needs.  Participation Level:  Minimal  Participation Quality:  Attentive  Affect:  Blunted  Cognitive:  Alert  Insight:  Engaged  Engagement in Group:  Engaged  Additional Comments:    8/20/20164:34 PM Felice Deem, Trixie Rude

## 2015-01-25 NOTE — Progress Notes (Signed)
D) Pt states is is doing well. Sits and smiles, casting his eyes down. Admits to being shy and quiet. Rates his depressions at a 5, hopelessness at a 3 and his anxiety at a 3. Denies SI and HI, delusions and hallucinations. Staes he knew he needed help and talked to his Optician, dispensing. Finally " I took myself to the hospital because I knew I was in trouble" A) Given support and praise along with encouragement. Provided with a 1:1. Active listening. Gentle approach with positive affirmations. R) Denies SI and HI.

## 2015-01-25 NOTE — Progress Notes (Signed)
[facility] MD Progress Note  01/25/2015 10:45 AM [name] [name]  MRN:  [id number] Subjective:  [name] [name] states he is doing fine.  Patient is smiling.  He states that he was in a really bad place, he was angry and he drank (2) 40 ounce.  He endorses feeling down not having much of an appetite.  He denies suicidal ideations.  He is non disruptive on the unit and per nursing attempts to attend groups.  He reported that he mentioned his left shoulder pain to the MD and is waiting for medication to obtain relief.    Principal Problem: Alcohol dependence with alcohol-induced mood disorder Diagnosis:   Patient Active Problem List   Diagnosis Date Noted  . Alcohol use disorder, severe, dependence [F10.20] 01/18/2015  . Alcohol dependence with alcohol-induced mood disorder [F10.24]   . Bipolar disorder [F31.9] 05/07/2014  . Suicidal ideation [R45.851]   . Major depressive episode [F32.9] 04/22/2014    Class: Acute  . Acute pancreatitis [K85.9] 03/20/2012  . Polysubstance abuse [F19.10] 03/20/2012  . Substance induced mood disorder [F19.94] 01/12/2012  . Cocaine abuse [F14.10] 01/12/2012  . Cannabis abuse [F12.10] 01/12/2012  . Alcohol dependence [F10.20] 10/15/2011  . Abnormal ECG [R94.31]   . Chest pain [R07.2]   . CAD in native artery [I25.10]   . GERD (gastroesophageal reflux disease) [K21.9]   . Tobacco abuse [Z72.0]   . History of cocaine abuse [F14.10]   . History of ETOH abuse [F10.10]   . Bradycardia [R00.1]   . Syncope [R55]    Total Time spent with patient: 30 minutes   Past Medical History:  Past Medical History  Diagnosis Date  . Chest pain, mid sternal   . CAD in native artery     a. Nonobstructive cath 11/2007;  b. Presented with ST elevation - Nonobstructive cath 08/2011  . GERD (gastroesophageal reflux disease)   . Tobacco abuse   . Marijuana abuse     a. uses ~ 1x /wk or less  . History of cocaine abuse     a. quit ? 2009  . History of ETOH abuse     a. drinks 2 "40's" / wk   . Bradycardia     a. asymptomatic  . Bipolar disorder   . Syncope     a. 12/2010 - presumed to be vasovagal  . Abnormal ECG     a. early repolarization  . Coronary artery disease   . Pneumonia   . Stomach ulcer   . Arthritis     "my whole left side" (05/03/2014)    Past Surgical History  Procedure Laterality Date  . Cardiac catheterization  2009; 08/2011    [name] 08/06/2011  . Left heart catheterization with coronary angiogram N/A 08/16/2011    Procedure: LEFT HEART CATHETERIZATION WITH CORONARY ANGIOGRAM;  Surgeon: [name], MD;  Location: [facility] CATH LAB;  Service: Cardiovascular;  Laterality: N/A;   Family History: History reviewed. No pertinent family history. Social History:  History  Alcohol Use  . 28.8 oz/week  . 48 Cans of beer per week    Comment: 05/03/2014 "2 40oz beers/day"     History  Drug Use  . Yes  . Special: Marijuana, Cocaine    Comment: 05/03/2014 "last marijuana was ~ 3 days ago; last cocaine was in 2014"    Social History   Social History  . Marital Status: Single    Spouse Name: N/A  . Number of Children: N/A  . Years of Education: N/A  Social History Main Topics  . Smoking status: Current Every Day Smoker -- 0.50 packs/day for 20 years    Types: Cigarettes  . Smokeless tobacco: Never Used  . Alcohol Use: 28.8 oz/week    48 Cans of beer per week     Comment: 05/03/2014 "2 40oz beers/day"  . Drug Use: Yes    Special: Marijuana, Cocaine     Comment: 05/03/2014 "last marijuana was ~ 3 days ago; last cocaine was in 2014"  . Sexual Activity: Yes   Other Topics Concern  . None   Social History Narrative   Lives with his daughter currently and receives SSI.    Additional History:    Sleep: Fair  Appetite:  Poor   Assessment:   Musculoskeletal: Strength & Muscle Tone: within normal limits Gait & Station: normal Patient leans: normal   Psychiatric Specialty Exam: Physical Exam  Vitals reviewed. Musculoskeletal: He  exhibits tenderness (left shoulder tenderness from arthritis).  Psychiatric: He does not exhibit a depressed mood.    Review of Systems  HENT: Negative.   Eyes: Negative.   Respiratory: Negative.   Cardiovascular: Negative.   Gastrointestinal: Negative.   Genitourinary: Negative.   Musculoskeletal: Positive for joint pain.  Skin: Negative.   Neurological: Positive for weakness.  Endo/Heme/Allergies: Negative.   Psychiatric/Behavioral: Positive for substance abuse. The patient is nervous/anxious.     Blood pressure 100/53, pulse 62, temperature 97.8 F (36.6 C), temperature source Oral, resp. rate 16, height 5\' 4"  (1.626 m), weight 58.514 kg (129 lb).Body mass index is 22.13 kg/(m^2).  General Appearance: Fairly Groomed  Engineer, water::  Fair  Speech:  Clear and Coherent and Slow  Volume:  Decreased  Mood:  Anxious and Depressed  Affect:  Appropriate and Restricted  Thought Process:  Coherent and Goal Directed  Orientation:  Full (Time, Place, and Person)  Thought Content:  symptoms events worries concerns  Suicidal Thoughts:  No  Homicidal Thoughts:  No  Memory:  Immediate;   Fair Recent;   Fair Remote;   Fair  Judgement:  Fair  Insight:  Present and Shallow  Psychomotor Activity:  Restlessness  Concentration:  Fair  Recall:  [employer] of Knowledge:Fair  Language: Fair  Akathisia:  No  Handed:  Right  AIMS (if indicated):     Assets:  Desire for Improvement Resilience  ADL's:  Intact  Cognition: WNL  Sleep:  Number of Hours: 6.5     Current Medications: Current Facility-Administered Medications  Medication Dose Route Frequency Provider Last Rate Last Dose  . alum & mag hydroxide-simeth (MAALOX/MYLANTA) 200-200-20 MG/5ML suspension 30 mL  30 mL Oral Q4H PRN Benjamine Mola, FNP      . feeding supplement (ENSURE ENLIVE) (ENSURE ENLIVE) liquid 237 mL  237 mL Oral TID BM Nicholaus Bloom, MD   237 mL at 01/24/15 1956  . magnesium hydroxide (MILK OF MAGNESIA) suspension  30 mL  30 mL Oral Daily PRN Benjamine Mola, FNP      . multivitamin with minerals tablet 1 tablet  1 tablet Oral Daily Benjamine Mola, FNP   1 tablet at 01/25/15 [phone]  . pantoprazole (PROTONIX) EC tablet 40 mg  40 mg Oral Daily Encarnacion Slates, NP   40 mg at 01/25/15 0807  . thiamine (VITAMIN B-1) tablet 100 mg  100 mg Oral Daily Benjamine Mola, FNP   100 mg at 01/25/15 9604  . traZODone (DESYREL) tablet 100 mg  100 mg Oral QHS Woodroe Chen  Sabra Heck, MD   100 mg at 01/24/15 2114    Lab Results: No results found for this or any previous visit (from the past 14 hour(s)).  Physical Findings: AIMS: Facial and Oral Movements Muscles of Facial Expression: None, normal Lips and Perioral Area: None, normal Jaw: None, normal Tongue: None, normal,Extremity Movements Upper (arms, wrists, hands, fingers): None, normal Lower (legs, knees, ankles, toes): None, normal, Trunk Movements Neck, shoulders, hips: None, normal, Overall Severity Severity of abnormal movements (highest score from questions above): None, normal Incapacitation due to abnormal movements: None, normal Patient's awareness of abnormal movements (rate only patient's report): No Awareness, Dental Status Current problems with teeth and/or dentures?: No Does patient usually wear dentures?: No  CIWA:  CIWA-Ar Total: 0 COWS:     Treatment Plan Summary: Daily contact with patient to assess and evaluate symptoms and progress in treatment and Medication management  Robaxin 500 mg TID,  Lidoderm patch Supportive approach/coping skills/relapse prevention Alcohol dependence/continue to work a relapse prevention plan Mood instability; will continue to reassess for a mood stabilizer/antidepressant. He prefers not to be taking any psychotropics Lack of appetite; will go ahead and add Ensure Will facilitate admission to [facility] on Monday AM Note; high risk for relapse if he does not go door to door. Has no family no support he is homeless and does not  have significant coping skills, very concrete   Medical Decision Making:  Review of Psycho-Social Stressors (1) and Review of Medication Regimen & Side Effects (2)  Ali Molina AGNP-BC 01/25/2015, 10:45 AM

## 2015-01-25 NOTE — BHH Group Notes (Signed)
Adult Psychoeducational Group Note  Date:  01/25/2015 Time:  8:00PM Group Topic/Focus:  Wrap-Up Group:   The focus of this group is to help patients review their daily goal of treatment and discuss progress on daily workbooks.  Participation Level:  Did Not Attend  Additional Comments: Pt didn't attend group meeting.   Theodoro Grist D 01/25/2015, 10:44 PM

## 2015-01-25 NOTE — BHH Group Notes (Addendum)
[facility] Group Notes:  (Nursing/MHT/Case Management/Adjunct)  Date:  01/25/2015  Time:  10:29 AM  Type of Therapy:  Nurse Education  /  Goals Group: The group is focused on teaching patients how to set attainable goals as well as how to develop skills needed to accomplish those goals.  Participation Level:  Did Not Attend  Participation Quality:  N/A  Affect:    Cognitive:    Insight:    Engagement in Group:    Modes of Intervention:    Summary of Progress/Problems:  Lauralyn Primes 01/25/2015, 10:29 AM

## 2015-01-25 NOTE — BHH Group Notes (Signed)
[facility] Group Notes: (Clinical Social Work)   01/25/2015      Type of Therapy:  Group Therapy   Participation Level:  Did Not Attend despite MHT prompting   Selmer Dominion, LCSW 01/25/2015, 12:51 PM

## 2015-01-26 MED ORDER — LIDOCAINE 5 % EX PTCH: 1.0000 | MEDICATED_PATCH | Freq: Every day | CUTANEOUS | Status: DC

## 2015-01-26 MED ORDER — PANTOPRAZOLE SODIUM 40 MG PO TBEC: 40.0000 mg | DELAYED_RELEASE_TABLET | Freq: Every day | ORAL | Status: DC

## 2015-01-26 MED ORDER — METHOCARBAMOL 500 MG PO TABS: 500.0000 mg | ORAL_TABLET | Freq: Three times a day (TID) | ORAL | Status: DC

## 2015-01-26 MED ORDER — TRAZODONE HCL 100 MG PO TABS: 100.0000 mg | ORAL_TABLET | Freq: Every day | ORAL | Status: DC

## 2015-01-26 NOTE — Progress Notes (Signed)
Patient ID: [name] [name], male   DOB: January 26, 1966, 49 y.o.   MRN: [id number] Pt with limited visibility in the milieu. Interacting appropriately with staff and peers. Pt rated his depression earlier today at a 10 however stated he is much better now. Pt stated his depression earlier was due to other patients and did a motion with his hands (as people talking). Needs assessed. Pt denied. Support and encouragement provided. Pt receptive. Fifteen minute checks in progress for patient safety. Pt safe on unit.

## 2015-01-26 NOTE — BHH Suicide Risk Assessment (Signed)
[facility] Discharge Suicide Risk Assessment   Demographic Factors:  Male  Total Time spent with patient: 30 minutes  Musculoskeletal: Strength & Muscle Tone: within normal limits Gait & Station: normal Patient leans: N/A  Psychiatric Specialty Exam: Physical Exam  Review of Systems  Psychiatric/Behavioral: Positive for substance abuse.  All other systems reviewed and are negative.   Blood pressure 103/54, pulse 64, temperature 97.6 F (36.4 C), temperature source Oral, resp. rate 16, height 5\' 4"  (1.626 m), weight 58.514 kg (129 lb).Body mass index is 22.13 kg/(m^2).  General Appearance: Casual  Eye Contact::  Good  Speech:  Clear and [id number]  Volume:  Normal  Mood:  Euthymic  Affect:  Appropriate  Thought Process:  Goal Directed  Orientation:  Full (Time, Place, and Person)  Thought Content:  WDL  Suicidal Thoughts:  No  Homicidal Thoughts:  No  Memory:  Immediate;   Fair Recent;   Fair Remote;   Fair  Judgement:  Fair  Insight:  Fair  Psychomotor Activity:  Normal  Concentration:  Fair  Recall:  [employer] of Knowledge:Fair  Language: Fair  Akathisia:  No  Handed:  Right  AIMS (if indicated):     Assets:  Communication Skills Desire for Improvement  Sleep:  Number of Hours: 6.5  Cognition: WNL  ADL's:  Intact   Have you used any form of tobacco in the last 30 days? (Cigarettes, Smokeless Tobacco, Cigars, and/or Pipes): No  Has this patient used any form of tobacco in the last 30 days? (Cigarettes, Smokeless Tobacco, Cigars, and/or Pipes) Yes, A prescription for an FDA-approved tobacco cessation medication was offered at discharge and the patient refused  Mental Status Per Nursing Assessment::   On Admission:  Thoughts of violence towards others  Current Mental Status by Physician: pt denies SI/HI/AHVH  Loss Factors: NA  Historical Factors: Impulsivity  Risk Reduction Factors:   Positive social support  Continued Clinical Symptoms:   Alcohol/Substance Abuse/Dependencies  Cognitive Features That Contribute To Risk:  None    Suicide Risk:  Minimal: No identifiable suicidal ideation.  Patients presenting with no risk factors but with morbid ruminations; may be classified as minimal risk based on the severity of the depressive symptoms  Principal Problem: Alcohol dependence with alcohol-induced mood disorder Discharge Diagnoses:  Patient Active Problem List   Diagnosis Date Noted  . Alcohol use disorder, severe, dependence [F10.20] 01/18/2015  . Alcohol dependence with alcohol-induced mood disorder [F10.24]   . Bipolar disorder [F31.9] 05/07/2014  . Suicidal ideation [R45.851]   . Major depressive episode [F32.9] 04/22/2014    Class: Acute  . Acute pancreatitis [K85.9] 03/20/2012  . Polysubstance abuse [F19.10] 03/20/2012  . Substance induced mood disorder [F19.94] 01/12/2012  . Cocaine abuse [F14.10] 01/12/2012  . Cannabis abuse [F12.10] 01/12/2012  . Alcohol dependence [F10.20] 10/15/2011  . Abnormal ECG [R94.31]   . Chest pain [R07.2]   . CAD in native artery [I25.10]   . GERD (gastroesophageal reflux disease) [K21.9]   . Tobacco abuse [Z72.0]   . History of cocaine abuse [F14.10]   . History of ETOH abuse [F10.10]   . Bradycardia [R00.1]   . Syncope [R55]     Follow-up Information    Follow up with [facility] On 01/27/2015.   Why:  Screening appointment for possible admission on Monday August 22nd at 8am. Please bring your [place] ID, 30 day supply of medications, and clothing. Call office if you need to reschedule.   Contact information:   [address] [place], [place] [address] [phone]      Follow up with [facility].   Why:  Walk in between 8am-9am Monday through Friday for hospital follow-up/medication management/assesment for therapy services.    Contact information:   [address] [place] [address] Phone: [phone]       Plan Of Care/Follow-up  recommendations:  Activity:  No restrictions Diet:  regular Tests:  as needed Other:  none  Is patient on multiple antipsychotic therapies at discharge:  No   Has Patient had three or more failed trials of antipsychotic monotherapy by history:  No  Recommended Plan for Multiple Antipsychotic Therapies: NA    Karynn Deblasi md 01/26/2015, 3:42 PM

## 2015-01-26 NOTE — Progress Notes (Signed)
[name] Progress Note  01/26/2015 3:59 PM [name] [name]  MRN:  [id number] Subjective:  [name] [name] states he is doing fine.  Patient is smiling.  He is visible in mileu.  He is looking forward to being discharged.  He denies suicidal ideations.  He is non disruptive on the unit and per nursing attempts to attend groups.  He states some relied with Robaxin and Lidoderm patch.   Principal Problem: Alcohol dependence with alcohol-induced mood disorder Diagnosis:   Patient Active Problem List   Diagnosis Date Noted  . Alcohol use disorder, severe, dependence [F10.20] 01/18/2015  . Alcohol dependence with alcohol-induced mood disorder [F10.24]   . Bipolar disorder [F31.9] 05/07/2014  . Suicidal ideation [R45.851]   . Major depressive episode [F32.9] 04/22/2014    Class: Acute  . Acute pancreatitis [K85.9] 03/20/2012  . Polysubstance abuse [F19.10] 03/20/2012  . Substance induced mood disorder [F19.94] 01/12/2012  . Cocaine abuse [F14.10] 01/12/2012  . Cannabis abuse [F12.10] 01/12/2012  . Alcohol dependence [F10.20] 10/15/2011  . Abnormal ECG [R94.31]   . Chest pain [R07.2]   . CAD in native artery [I25.10]   . GERD (gastroesophageal reflux disease) [K21.9]   . Tobacco abuse [Z72.0]   . History of cocaine abuse [F14.10]   . History of ETOH abuse [F10.10]   . Bradycardia [R00.1]   . Syncope [R55]    Total Time spent with patient: 30 minutes   Past Medical History:  Past Medical History  Diagnosis Date  . Chest pain, mid sternal   . CAD in native artery     a. Nonobstructive cath 11/2007;  b. Presented with ST elevation - Nonobstructive cath 08/2011  . GERD (gastroesophageal reflux disease)   . Tobacco abuse   . Marijuana abuse     a. uses ~ 1x /wk or less  . History of cocaine abuse     a. quit ? 2009  . History of ETOH abuse     a. drinks 2 "40's" / wk  . Bradycardia     a. asymptomatic  . Bipolar disorder   . Syncope     a. 12/2010 - presumed to be vasovagal  . Abnormal ECG      a. early repolarization  . Coronary artery disease   . Pneumonia   . Stomach ulcer   . Arthritis     "my whole left side" (05/03/2014)    Past Surgical History  Procedure Laterality Date  . Cardiac catheterization  2009; 08/2011    [name] [name] 08/06/2011  . Left heart catheterization with coronary angiogram N/A 08/16/2011    Procedure: LEFT HEART CATHETERIZATION WITH CORONARY ANGIOGRAM;  Surgeon: [name] Booze, MD;  Location: [facility] CATH LAB;  Service: Cardiovascular;  Laterality: N/A;   Family History: History reviewed. No pertinent family history. Social History:  History  Alcohol Use  . 28.8 oz/week  . 48 Cans of beer per week    Comment: 05/03/2014 "2 40oz beers/day"     History  Drug Use  . Yes  . Special: Marijuana, Cocaine    Comment: 05/03/2014 "last marijuana was ~ 3 days ago; last cocaine was in 2014"    Social History   Social History  . Marital Status: Single    Spouse Name: N/A  . Number of Children: N/A  . Years of Education: N/A   Social History Main Topics  . Smoking status: Current Every Day Smoker -- 0.50 packs/day for 20 years    Types: Cigarettes  . Smokeless tobacco:  Never Used  . Alcohol Use: 28.8 oz/week    48 Cans of beer per week     Comment: 05/03/2014 "2 40oz beers/day"  . Drug Use: Yes    Special: Marijuana, Cocaine     Comment: 05/03/2014 "last marijuana was ~ 3 days ago; last cocaine was in 2014"  . Sexual Activity: Yes   Other Topics Concern  . None   Social History Narrative   Lives with his daughter currently and receives SSI.    Additional History:    Sleep: Fair  Appetite:  Poor   Assessment:   Musculoskeletal: Strength & Muscle Tone: within normal limits Gait & Station: normal Patient leans: normal   Psychiatric Specialty Exam: Physical Exam  Vitals reviewed. Musculoskeletal: He exhibits tenderness (left shoulder tenderness from arthritis).  Psychiatric: He does not exhibit a depressed mood.    Review of Systems   HENT: Negative.   Eyes: Negative.   Respiratory: Negative.   Cardiovascular: Negative.   Gastrointestinal: Negative.   Genitourinary: Negative.   Musculoskeletal: Positive for joint pain.  Skin: Negative.   Neurological: Positive for weakness.  [name]/Heme/Allergies: Negative.   Psychiatric/Behavioral: Positive for substance abuse. The patient is nervous/anxious.     Blood pressure 103/54, pulse 64, temperature 97.6 F (36.4 C), temperature source Oral, resp. rate 16, height 5\' 4"  (1.626 m), weight 58.514 kg (129 lb).Body mass index is 22.13 kg/(m^2).  General Appearance: Fairly Groomed  Engineer, water::  Fair  Speech:  Clear and Coherent and Slow  Volume:  Decreased  Mood:  Anxious and Depressed  Affect:  Appropriate and Restricted  Thought Process:  Coherent and Goal Directed  Orientation:  Full (Time, Place, and Person)  Thought Content:  symptoms events worries concerns  Suicidal Thoughts:  No  Homicidal Thoughts:  No  Memory:  Immediate;   Fair Recent;   Fair Remote;   Fair  Judgement:  Fair  Insight:  Present and Shallow  Psychomotor Activity:  Restlessness  Concentration:  Fair  Recall:  [employer] of Knowledge:Fair  Language: Fair  Akathisia:  No  Handed:  Right  AIMS (if indicated):     Assets:  Desire for Improvement Resilience  ADL's:  Intact  Cognition: WNL  Sleep:  Number of Hours: 6.5     Current Medications: Current Facility-Administered Medications  Medication Dose Route Frequency Provider Last Rate Last Dose  . alum & mag hydroxide-simeth (MAALOX/MYLANTA) 200-200-20 MG/5ML suspension 30 mL  30 mL Oral Q4H PRN Benjamine Mola, FNP      . feeding supplement (ENSURE ENLIVE) (ENSURE ENLIVE) liquid 237 mL  237 mL Oral TID BM Nicholaus Bloom, MD   237 mL at 01/26/15 1458  . lidocaine (LIDODERM) 5 % 1 patch  1 patch Transdermal Daily Kerrie Buffalo, NP   1 patch at 01/26/15 [phone]  . magnesium hydroxide (MILK OF MAGNESIA) suspension 30 mL  30 mL Oral Daily PRN  Benjamine Mola, FNP      . methocarbamol (ROBAXIN) tablet 500 mg  500 mg Oral TID Kerrie Buffalo, NP   500 mg at 01/26/15 1157  . multivitamin with minerals tablet 1 tablet  1 tablet Oral Daily Benjamine Mola, FNP   1 tablet at 01/26/15 [phone]  . pantoprazole (PROTONIX) EC tablet 40 mg  40 mg Oral Daily Encarnacion Slates, NP   40 mg at 01/26/15 0829  . thiamine (VITAMIN B-1) tablet 100 mg  100 mg Oral Daily Benjamine Mola, FNP   100  mg at 01/26/15 0829  . traZODone (DESYREL) tablet 100 mg  100 mg Oral QHS Nicholaus Bloom, MD   100 mg at 01/25/15 2131    Lab Results: No results found for this or any previous visit (from the past 48 hour(s)).  Physical Findings: AIMS: Facial and Oral Movements Muscles of Facial Expression: None, normal Lips and Perioral Area: None, normal Jaw: None, normal Tongue: None, normal,Extremity Movements Upper (arms, wrists, hands, fingers): None, normal Lower (legs, knees, ankles, toes): None, normal, Trunk Movements Neck, shoulders, hips: None, normal, Overall Severity Severity of abnormal movements (highest score from questions above): None, normal Incapacitation due to abnormal movements: None, normal Patient's awareness of abnormal movements (rate only patient's report): No Awareness, Dental Status Current problems with teeth and/or dentures?: No Does patient usually wear dentures?: No  CIWA:  CIWA-Ar Total: 0 COWS:     Treatment Plan Summary: Daily contact with patient to assess and evaluate symptoms and progress in treatment and Medication management  Robaxin 500 mg TID,  Lidoderm patch Supportive approach/coping skills/relapse prevention Alcohol dependence/continue to work a relapse prevention plan Mood instability; will continue to reassess for a mood stabilizer/antidepressant. He prefers not to be taking any psychotropics Lack of appetite; will go ahead and add Ensure Will facilitate admission to [name] on Monday AM Note; high risk for relapse if he does  not go door to door. Has no family no support he is homeless and does not have significant coping skills, very concrete   Medical Decision Making:  Review of Psycho-Social Stressors (1) and Review of Medication Regimen & Side Effects (2)  Dilara Navarrete May Orton Capell AGNP-BC 01/26/2015, 3:59 PM

## 2015-01-26 NOTE — [facility] Group Notes (Signed)
[facility] Group Notes:  (Clinical Social Work)  01/26/2015  10:00-11:00AM  Summary of Progress/Problems:   The main focus of today's process group was to   1)  discuss the importance of adding supports  2)  define health supports versus unhealthy supports  3)  identify the patient's current unhealthy supports and plan how to handle them  4)  Identify the patient's current healthy supports and plan what to add.  An emphasis was placed on using counselor, doctor, therapy groups, 12-step groups, and problem-specific support groups to expand supports.    The patient expressed full comprehension of the concepts presented, and agreed that there is a need to add more supports.  The patient stated his nephew and nephew's family, as well as mother, father, and God are his supports.  He had a lot of questions for how to go about asking for help, for instance, how to handle when he is on medications but wakes up feeling bad anyway, what he can do.  The group brainstormed answers and advised him to get involved in therapy so that he can learn new coping techniques as well as get an opportunity to practice these.  Type of Therapy:  Process Group with Motivational Interviewing  Participation Level:  Active  Participation Quality:  Appropriate, Attentive and Sharing  Affect:  Appropriate  Cognitive:  Alert, Appropriate and Oriented  Insight:  Engaged  Engagement in Therapy:  Engaged  Modes of Intervention:   Education, Support and Processing, Activity  Selmer Dominion, LCSW 01/26/2015

## 2015-01-26 NOTE — Progress Notes (Signed)
Patient did not attend the evening speaker AA meeting. Pt was notified that group was beginning but returned to his room.   

## 2015-01-26 NOTE — Plan of Care (Signed)
Problem: Ineffective individual coping Goal: STG: Patient will remain free from self harm Outcome: Progressing Patient has not engaged in self harm and denies SI  Problem: Alteration in mood Goal: STG-Patient is able to discuss feelings and issues (Patient is able to discuss feelings and issues leading to depression)  Outcome: Progressing Patient expressing some anxiety regarding discharge to [facility] tomorrow.

## 2015-01-26 NOTE — Progress Notes (Signed)
Met with patient 1:1. Patient is somewhat anxious about his transfer to [facility] in the morning but otherwise mood is pleasant. He rates his depression at a 1/10, hopelessness and anxiety at a 10/10. He reports chronic L shoulder pain of a 2/10 and complains of a mild headache of a 1-2/10. States his goal is to "stay clean". Support offered. Patient medicated per orders. No prn meds for pain ordered and patient encouraged to speak to provider if headache worsens. Offered ice pack but patient refuses at this time. Patient denies SI/HI and remains safe. Jamie Kato

## 2015-01-27 NOTE — Progress Notes (Signed)
   D: Pt was pleasant and in the dayroom with his peers prior to the assessment. Informed the writer that his nephew is supposed to pick him up at 0715 to transport to [facility]. Pt voiced no questions or concerns at this time.  A:  Support and encouragement was offered. 15 min checks continued for safety.  R: Pt remains safe.

## 2015-01-27 NOTE — Progress Notes (Signed)
Pt denied SI, HI, and A/V. Pt acknowledged understanding of the discharge instructions. Pt attempted to contact his nephew, but at this point has not gotten a response. Pt asked about his options if his nephew does not show. Writer encouraged pt to contact [facility], and to have the receptionist at [facility] call to speak with the SW. Pt has no other questions or concerns.

## 2015-01-29 NOTE — Discharge Summary (Signed)
Physician Discharge Summary Note  Patient:  [name] [name] is an 49 y.o., male MRN:  [id number] DOB:  08-14-1965 Patient phone:  [phone] (home)  Patient address:   [address] [name] [address],  Total Time spent with patient: 45 minutes  Date of Admission:  01/18/2015 Date of Discharge: 01/26/2015   Reason for Admission:  Alcohol intoxication, depression  Principal Problem: Alcohol dependence with alcohol-induced mood disorder Discharge Diagnoses: Patient Active Problem List   Diagnosis Date Noted  . Alcohol use disorder, severe, dependence [F10.20] 01/18/2015  . Alcohol dependence with alcohol-induced mood disorder [F10.24]   . Bipolar disorder [F31.9] 05/07/2014  . Suicidal ideation [R45.851]   . Major depressive episode [F32.9] 04/22/2014    Class: Acute  . Acute pancreatitis [K85.9] 03/20/2012  . Polysubstance abuse [F19.10] 03/20/2012  . Substance induced mood disorder [F19.94] 01/12/2012  . Cocaine abuse [F14.10] 01/12/2012  . Cannabis abuse [F12.10] 01/12/2012  . Alcohol dependence [F10.20] 10/15/2011  . Abnormal ECG [R94.31]   . Chest pain [R07.2]   . CAD in native artery [I25.10]   . GERD (gastroesophageal reflux disease) [K21.9]   . Tobacco abuse [Z72.0]   . History of cocaine abuse [F14.10]   . History of ETOH abuse [F10.10]   . Bradycardia [R00.1]   . Syncope [R55]     Musculoskeletal: Strength & Muscle Tone: within normal limits Gait & Station: normal Patient leans: N/A  Psychiatric Specialty Exam: Physical Exam  Psychiatric: He does not exhibit a depressed mood. He expresses no homicidal and no suicidal ideation.    Review of Systems  Psychiatric/Behavioral: Negative for depression and suicidal ideas. The patient does not have insomnia.   All other systems reviewed and are negative.   Blood pressure 103/54, pulse 64, temperature 97.6 F (36.4 C), temperature source Oral, resp. rate 16, height 5\' 4"  (1.626 m), weight 58.514 kg  (129 lb).Body mass index is 22.13 kg/(m^2).   General Appearance: Casual  Eye Contact:: Good  Speech: Clear and Coherent  Volume: Normal  Mood: Euthymic  Affect: Appropriate  Thought Process: Goal Directed  Orientation: Full (Time, Place, and Person)  Thought Content: WDL  Suicidal Thoughts: No  Homicidal Thoughts: No  Memory: Immediate; Fair Recent; Fair Remote; Fair  Judgement: Fair  Insight: Fair  Psychomotor Activity: Normal  Concentration: Fair  Recall: [employer] of Knowledge:Fair  Language: Fair  Akathisia: No  Handed: Right  AIMS (if indicated):    Assets: Communication Skills Desire for Improvement  Sleep: Number of Hours: 6.5  Cognition: WNL  ADL's: Intact        Have you used any form of tobacco in the last 30 days? (Cigarettes, Smokeless Tobacco, Cigars, and/or Pipes): No  Has this patient used any form of tobacco in the last 30 days? (Cigarettes, Smokeless Tobacco, Cigars, and/or Pipes) N/A  Past Medical History:  Past Medical History  Diagnosis Date  . Chest pain, mid sternal   . CAD in native artery     a. Nonobstructive cath 11/2007;  b. Presented with ST elevation - Nonobstructive cath 08/2011  . GERD (gastroesophageal reflux disease)   . Tobacco abuse   . Marijuana abuse     a. uses ~ 1x /wk or less  . History of cocaine abuse     a. quit ? 2009  . History of ETOH abuse     a. drinks 2 "40's" / wk  . Bradycardia     a. asymptomatic  . Bipolar disorder   .  Syncope     a. 12/2010 - presumed to be vasovagal  . Abnormal ECG     a. early repolarization  . Coronary artery disease   . Pneumonia   . Stomach ulcer   . Arthritis     "my whole left side" (05/03/2014)    Past Surgical History  Procedure Laterality Date  . Cardiac catheterization  2009; 08/2011    [name] 08/06/2011  . Left heart catheterization with coronary angiogram N/A 08/16/2011    Procedure: LEFT HEART CATHETERIZATION WITH  CORONARY ANGIOGRAM;  Surgeon: [name], MD;  Location: [name] CATH LAB;  Service: Cardiovascular;  Laterality: N/A;   Family History: History reviewed. No pertinent family history. Social History:  History  Alcohol Use  . 28.8 oz/week  . 48 Cans of beer per week    Comment: 05/03/2014 "2 40oz beers/day"     History  Drug Use  . Yes  . Special: Marijuana, Cocaine    Comment: 05/03/2014 "last marijuana was ~ 3 days ago; last cocaine was in 2014"    Social History   Social History  . Marital Status: Single    Spouse Name: N/A  . Number of Children: N/A  . Years of Education: N/A   Social History Main Topics  . Smoking status: Current Every Day Smoker -- 0.50 packs/day for 20 years    Types: Cigarettes  . Smokeless tobacco: Never Used  . Alcohol Use: 28.8 oz/week    48 Cans of beer per week     Comment: 05/03/2014 "2 40oz beers/day"  . Drug Use: Yes    Special: Marijuana, Cocaine     Comment: 05/03/2014 "last marijuana was ~ 3 days ago; last cocaine was in 2014"  . Sexual Activity: Yes   Other Topics Concern  . None   Social History Narrative   Lives with his daughter currently and receives SSI.    Risk to Self: Is patient at risk for suicide?: No What has been your use of drugs/alcohol within the last 12 months?: Alcohol, THC and crack cocaine Risk to Others:   Prior Inpatient Therapy:   Prior Outpatient Therapy:    Level of Care:  OP  Hospital Course:  [name] [name] was admitted for Alcohol dependence with alcohol-induced mood disorder and crisis management.  He was treated discharged with the medications listed below under Medication List.  Medical problems were identified and treated as needed.  Home medications were restarted as appropriate.  Improvement was monitored by observation and [name] [name] daily report of symptom reduction.  Emotional and mental status was monitored by daily self-inventory reports completed by [name] [name] and clinical staff.          [name] [name] was evaluated by the treatment team for stability and plans for continued recovery upon discharge.  [name] [name] motivation was an integral factor for scheduling further treatment.  Employment, transportation, bed availability, health status, family support, and any pending legal issues were also considered during his hospital stay.  was offered further treatment options upon discharge including but not limited to Residential, Intensive Outpatient, and Outpatient treatment.  [name] [name] will follow up with the services as listed below under Follow Up Information.     Upon completion of this admission the patient was both mentally and medically stable for discharge denying suicidal/homicidal ideation, auditory/visual/tactile hallucinations, delusional thoughts and paranoia.      Consults:  psychiatry  Significant Diagnostic Studies:  labs: per ED  Discharge  Vitals:   Blood pressure 103/54, pulse 64, temperature 97.6 F (36.4 C), temperature source Oral, resp. rate 16, height 5\' 4"  (1.626 m), weight 58.514 kg (129 lb). Body mass index is 22.13 kg/(m^2). Lab Results:   No results found for this or any previous visit (from the past 72 hour(s)).  Physical Findings: AIMS: Facial and Oral Movements Muscles of Facial Expression: None, normal Lips and Perioral Area: None, normal Jaw: None, normal Tongue: None, normal,Extremity Movements Upper (arms, wrists, hands, fingers): None, normal Lower (legs, knees, ankles, toes): None, normal, Trunk Movements Neck, shoulders, hips: None, normal, Overall Severity Severity of abnormal movements (highest score from questions above): None, normal Incapacitation due to abnormal movements: None, normal Patient's awareness of abnormal movements (rate only patient's report): No Awareness, Dental Status Current problems with teeth and/or dentures?: No Does patient usually wear dentures?: No  CIWA:  CIWA-Ar Total: 0 COWS:      See  Psychiatric Specialty Exam and Suicide Risk Assessment completed by Attending Physician prior to discharge.  Discharge destination:  Home  Is patient on multiple antipsychotic therapies at discharge:  No   Has Patient had three or more failed trials of antipsychotic monotherapy by history:  No    Recommended Plan for Multiple Antipsychotic Therapies: NA     Medication List    STOP taking these medications        famotidine 20 MG tablet  Commonly known as:  PEPCID     omeprazole 20 MG capsule  Commonly known as:  PRILOSEC  Replaced by:  pantoprazole 40 MG tablet     QUEtiapine 200 MG tablet  Commonly known as:  SEROQUEL     traMADol 50 MG tablet  Commonly known as:  ULTRAM      TAKE these medications      Indication   lidocaine 5 %  Commonly known as:  LIDODERM  Place 1 patch onto the skin daily. Remove & Discard patch within 12 hours or as directed by MD   Indication:  Allodynia     methocarbamol 500 MG tablet  Commonly known as:  ROBAXIN  Take 1 tablet (500 mg total) by mouth 3 (three) times daily.   Indication:  Musculoskeletal Pain     pantoprazole 40 MG tablet  Commonly known as:  PROTONIX  Take 1 tablet (40 mg total) by mouth daily.   Indication:  Gastroesophageal Reflux Disease     traZODone 100 MG tablet  Commonly known as:  DESYREL  Take 1 tablet (100 mg total) by mouth at bedtime.   Indication:  Trouble Sleeping       Follow-up Information    Follow up with [facility] On 01/27/2015.   Why:  Screening appointment for possible admission on Monday August 22nd at 8am. Please bring your [place] ID, 30 day supply of medications, and clothing. Call office if you need to reschedule.   Contact information:   [address] [place], [place] [address] [phone]      Follow up with [facility].   Why:  Walk in between 8am-9am Monday through Friday for hospital follow-up/medication management/assesment for therapy services.    Contact  information:   [address], [place] [address] Phone: [phone]       Follow-up recommendations:  Activity:  as tol, diet as tol  Comments:  1.  Take all your medications as prescribed.              2.  Report any adverse  side effects to outpatient provider.                       3.  Patient instructed to not use alcohol or illegal drugs while on prescription medicines.            4.  In the event of worsening symptoms, instructed patient to call 911, the crisis hotline or go to nearest emergency room for evaluation of symptoms.  Total Discharge Time: 40 min  Signed: Freda Munro May Leanny Moeckel AGNP-BC 01/29/2015, 9:25 AM

## 2015-02-24 ENCOUNTER — Emergency Department (HOSPITAL_COMMUNITY)
Admission: EM | Admit: 2015-02-24 | Discharge: 2015-02-24 | Disposition: A | Discharge status: Home / Self Care | Payer: Medicaid Other | Attending: Emergency Medicine | Admitting: Emergency Medicine

## 2015-02-24 ENCOUNTER — Emergency Department (HOSPITAL_COMMUNITY): Payer: Medicaid Other

## 2015-02-24 ENCOUNTER — Encounter (HOSPITAL_COMMUNITY): Payer: Self-pay

## 2015-02-24 DIAGNOSIS — I2510 Atherosclerotic heart disease of native coronary artery without angina pectoris: Secondary | ICD-10-CM | POA: Insufficient documentation

## 2015-02-24 DIAGNOSIS — Z720 Tobacco use: Secondary | ICD-10-CM | POA: Insufficient documentation

## 2015-02-24 DIAGNOSIS — Z8701 Personal history of pneumonia (recurrent): Secondary | ICD-10-CM | POA: Insufficient documentation

## 2015-02-24 DIAGNOSIS — K219 Gastro-esophageal reflux disease without esophagitis: Secondary | ICD-10-CM | POA: Diagnosis not present

## 2015-02-24 DIAGNOSIS — R1013 Epigastric pain: Secondary | ICD-10-CM

## 2015-02-24 DIAGNOSIS — M1990 Unspecified osteoarthritis, unspecified site: Secondary | ICD-10-CM | POA: Diagnosis not present

## 2015-02-24 DIAGNOSIS — K2970 Gastritis, unspecified, without bleeding: Secondary | ICD-10-CM | POA: Diagnosis not present

## 2015-02-24 DIAGNOSIS — R001 Bradycardia, unspecified: Secondary | ICD-10-CM | POA: Insufficient documentation

## 2015-02-24 DIAGNOSIS — R110 Nausea: Secondary | ICD-10-CM

## 2015-02-24 DIAGNOSIS — Z79899 Other long term (current) drug therapy: Secondary | ICD-10-CM | POA: Insufficient documentation

## 2015-02-24 DIAGNOSIS — F319 Bipolar disorder, unspecified: Secondary | ICD-10-CM | POA: Diagnosis not present

## 2015-02-24 DIAGNOSIS — R079 Chest pain, unspecified: Secondary | ICD-10-CM | POA: Insufficient documentation

## 2015-02-24 DIAGNOSIS — R197 Diarrhea, unspecified: Secondary | ICD-10-CM | POA: Insufficient documentation

## 2015-02-24 DIAGNOSIS — Z9889 Other specified postprocedural states: Secondary | ICD-10-CM | POA: Insufficient documentation

## 2015-02-24 LAB — CBC WITH DIFFERENTIAL/PLATELET
Basophils Absolute: 0 10*3/uL (ref 0.0–0.1)
Basophils Relative: 1 %
EOS ABS: 0.1 10*3/uL (ref 0.0–0.7)
Eosinophils Relative: 4 %
HEMATOCRIT: 39 % (ref 39.0–52.0)
HEMOGLOBIN: 13.3 g/dL (ref 13.0–17.0)
LYMPHS ABS: 1.5 10*3/uL (ref 0.7–4.0)
Lymphocytes Relative: 44 %
MCH: 31.2 pg (ref 26.0–34.0)
MCHC: 34.1 g/dL (ref 30.0–36.0)
MCV: 91.5 fL (ref 78.0–100.0)
MONO ABS: 0.4 10*3/uL (ref 0.1–1.0)
MONOS PCT: 12 %
NEUTROS PCT: 39 %
Neutro Abs: 1.3 10*3/uL — ABNORMAL LOW (ref 1.7–7.7)
Platelets: 201 10*3/uL (ref 150–400)
RBC: 4.26 MIL/uL (ref 4.22–5.81)
RDW: 14.3 % (ref 11.5–15.5)
WBC: 3.3 10*3/uL — ABNORMAL LOW (ref 4.0–10.5)

## 2015-02-24 LAB — URINALYSIS, ROUTINE W REFLEX MICROSCOPIC
Bilirubin Urine: NEGATIVE
Glucose, UA: NEGATIVE mg/dL
Hgb urine dipstick: NEGATIVE
Ketones, ur: NEGATIVE mg/dL
Leukocytes, UA: NEGATIVE
Nitrite: NEGATIVE
Protein, ur: NEGATIVE mg/dL
Specific Gravity, Urine: 1.007 (ref 1.005–1.030)
Urobilinogen, UA: 0.2 mg/dL (ref 0.0–1.0)
pH: 7.5 (ref 5.0–8.0)

## 2015-02-24 LAB — LIPASE, BLOOD: Lipase: 45 U/L (ref 22–51)

## 2015-02-24 LAB — I-STAT CG4 LACTIC ACID, ED: LACTIC ACID, VENOUS: 0.79 mmol/L (ref 0.5–2.0)

## 2015-02-24 LAB — COMPREHENSIVE METABOLIC PANEL
ALK PHOS: 58 U/L (ref 38–126)
ALT: 20 U/L (ref 17–63)
ANION GAP: 4 — AB (ref 5–15)
AST: 22 U/L (ref 15–41)
Albumin: 3.8 g/dL (ref 3.5–5.0)
BILIRUBIN TOTAL: 0.6 mg/dL (ref 0.3–1.2)
BUN: 8 mg/dL (ref 6–20)
CALCIUM: 9.2 mg/dL (ref 8.9–10.3)
CO2: 29 mmol/L (ref 22–32)
Chloride: 105 mmol/L (ref 101–111)
Creatinine, Ser: 1.04 mg/dL (ref 0.61–1.24)
GFR calc non Af Amer: >60 mL/min (ref >60)
Glucose, Bld: 88 mg/dL (ref 65–99)
Potassium: 4.3 mmol/L (ref 3.5–5.1)
SODIUM: 138 mmol/L (ref 135–145)
TOTAL PROTEIN: 6.6 g/dL (ref 6.5–8.1)

## 2015-02-24 LAB — I-STAT TROPONIN, ED: Troponin i, poc: 0.03 ng/mL (ref 0.00–0.08)

## 2015-02-24 MED ORDER — ONDANSETRON HCL 4 MG/2ML IJ SOLN
4.0000 mg | Freq: Once | INTRAMUSCULAR | Status: AC
  Administered 2015-02-24: 4 mg via INTRAVENOUS
  Filled 2015-02-24: qty 2

## 2015-02-24 MED ORDER — SODIUM CHLORIDE 0.9 % IV BOLUS (SEPSIS)
1000.0000 mL | Freq: Once | INTRAVENOUS | Status: AC
  Administered 2015-02-24: 1000 mL via INTRAVENOUS

## 2015-02-24 MED ORDER — GI COCKTAIL ~~LOC~~
30.0000 mL | Freq: Once | ORAL | Status: AC
  Administered 2015-02-24: 30 mL via ORAL
  Filled 2015-02-24: qty 30

## 2015-02-24 MED ORDER — PANTOPRAZOLE SODIUM 40 MG IV SOLR
40.0000 mg | Freq: Once | INTRAVENOUS | Status: AC
  Administered 2015-02-24: 40 mg via INTRAVENOUS
  Filled 2015-02-24: qty 40

## 2015-02-24 NOTE — ED Provider Notes (Signed)
Patient presented to the ER with abdominal and chest pain. Patient has severe pain in the center of his upper abdomen that radiates up into the chest.  Face to face Exam: HEENT - PERRLA Lungs - CTAB Heart - RRR, no M/R/G Abd - tender epigastric Neuro - alert, oriented x3  Plan: Patient with previous history of GERD and MI presents with abdominal and chest pain. Patient has significant tenderness in epigastric region, felt to be consistent with GI origin of his symptoms. Will perform cardiac rule out. Evaluate for significant GI bleed.  Orpah Greek, MD 02/24/15 [phone]

## 2015-02-24 NOTE — ED Notes (Signed)
Pt given urinal for sample 

## 2015-02-24 NOTE — ED Provider Notes (Signed)
CSN: [id number]     Arrival date & time 02/24/15  50 History   First MD Initiated Contact with Patient 02/24/15 1517     Chief Complaint: Epigastric Pain  Patient is a 49 y.o. male presenting with general illness. The history is provided by the patient. No language interpreter was used.  Illness Location:  Epigastrum Quality:  Pain Severity:  Moderate Onset quality:  Gradual Timing:  Constant Progression:  Worsening Chronicity:  Recurrent Context:  PMHx of GERD, PUD, & CAD presenting with epigastric pain. Onset x2 days ago after eating. Worse post-prandially. Nausea which is worse with increased pain. One episode of diarrhea today described as melanic. Denies SOB, emesis, lightheadedness, or diaphoresis.  Associated symptoms: abdominal pain, diarrhea and nausea   Associated symptoms: no chest pain, no congestion, no cough, no fever, no loss of consciousness, no shortness of breath and no vomiting     Past Medical History  Diagnosis Date  . Chest pain, mid sternal   . CAD in native artery     a. Nonobstructive cath 11/2007;  b. Presented with ST elevation - Nonobstructive cath 08/2011  . GERD (gastroesophageal reflux disease)   . Tobacco abuse   . Marijuana abuse     a. uses ~ 1x /wk or less  . History of cocaine abuse     a. quit ? 2009  . History of ETOH abuse     a. drinks 2 "40's" / wk  . Bradycardia     a. asymptomatic  . Bipolar disorder   . Syncope     a. 12/2010 - presumed to be vasovagal  . Abnormal ECG     a. early repolarization  . Coronary artery disease   . Pneumonia   . Stomach ulcer   . Arthritis     "my whole left side" (05/03/2014)   Past Surgical History  Procedure Laterality Date  . Cardiac catheterization  2009; 08/2011    [name] 08/06/2011  . Left heart catheterization with coronary angiogram N/A 08/16/2011    Procedure: LEFT HEART CATHETERIZATION WITH CORONARY ANGIOGRAM;  Surgeon: [name], MD;  Location: [facility] CATH LAB;  Service:  Cardiovascular;  Laterality: N/A;   History reviewed. No pertinent family history. Social History  Substance Use Topics  . Smoking status: Current Every Day Smoker -- 0.50 packs/day for 20 years    Types: Cigarettes  . Smokeless tobacco: Never Used  . Alcohol Use: 28.8 oz/week    48 Cans of beer per week     Comment: 05/03/2014 "2 40oz beers/day"    Review of Systems  Constitutional: Negative for fever and chills.  HENT: Negative for congestion.   Respiratory: Negative for cough and shortness of breath.   Cardiovascular: Negative for chest pain.  Gastrointestinal: Positive for nausea, abdominal pain, diarrhea and blood in stool. Negative for vomiting, constipation, abdominal distention, anal bleeding and rectal pain.  Genitourinary: Negative for dysuria, urgency, frequency and hematuria.  Neurological: Negative for loss of consciousness.  All other systems reviewed and are negative.   Allergies  Aspirin; Pepperoni; Tomato; and Tylenol  Home Medications   Prior to Admission medications   Medication Sig Start Date End Date Taking? Authorizing Provider  ibuprofen (ADVIL,MOTRIN) 200 MG tablet Take 400 mg by mouth every 6 (six) hours as needed for mild pain.   Yes Historical Provider, MD  lidocaine (LIDODERM) 5 % Place 1 patch onto the skin daily. Remove & Discard patch within 12 hours or as directed by MD 01/26/15  Kerrie Buffalo, NP  methocarbamol (ROBAXIN) 500 MG tablet Take 1 tablet (500 mg total) by mouth 3 (three) times daily. 01/26/15   Kerrie Buffalo, NP  pantoprazole (PROTONIX) 40 MG tablet Take 1 tablet (40 mg total) by mouth daily. 01/26/15   Kerrie Buffalo, NP  traZODone (DESYREL) 100 MG tablet Take 1 tablet (100 mg total) by mouth at bedtime. 01/26/15   Kerrie Buffalo, NP   BP 112/62 mmHg  Pulse 55  Temp(Src) 97.4 F (36.3 C) (Oral)  Resp 20  Ht 5\' 4"  (1.626 m)  Wt 135 lb (61.236 kg)  BMI 23.16 kg/m2  SpO2 100%   Physical Exam  Constitutional: He is oriented to  person, place, and time. He appears well-developed and well-nourished. He appears distressed.  HENT:  Head: Normocephalic and atraumatic.  Eyes: Conjunctivae are normal. Pupils are equal, round, and reactive to light.  Neck: Normal range of motion. Neck supple.  Cardiovascular: Regular rhythm and intact distal pulses.  Bradycardia present.   Pulmonary/Chest: Effort normal and breath sounds normal. No respiratory distress. He has no wheezes.  Abdominal: Soft. Bowel sounds are normal. He exhibits no distension. There is tenderness. There is no rebound and no guarding.  Musculoskeletal: Normal range of motion.  Neurological: He is alert and oriented to person, place, and time.  Skin: Skin is warm and dry. He is not diaphoretic.  Nursing note and vitals reviewed.   ED Course  Procedures   Labs Review Labs Reviewed  CBC WITH DIFFERENTIAL/PLATELET - Abnormal; Notable for the following:    WBC 3.3 (*)    Neutro Abs 1.3 (*)    All other components within normal limits  COMPREHENSIVE METABOLIC PANEL - Abnormal; Notable for the following:    Anion gap 4 (*)    All other components within normal limits  LIPASE, BLOOD  URINALYSIS, ROUTINE W REFLEX MICROSCOPIC (NOT AT [facility])  I-STAT CG4 LACTIC ACID, ED  Randolm Idol, ED   Imaging Review Dg Chest 2 View  02/24/2015   CLINICAL DATA:  Chest pain for 4 days with difficulty breathing  EXAM: CHEST - 2 VIEW  COMPARISON:  05/19/2014  FINDINGS: The heart size and mediastinal contours are within normal limits. Both lungs are clear. The visualized skeletal structures are unremarkable.  IMPRESSION: No active disease.   Electronically Signed   By: Inez Catalina M.D.   On: 02/24/2015 15:38   I have personally reviewed and evaluated these images and lab results as part of my medical decision-making.   EKG Interpretation   Date/Time:  Monday February 24 2015 14:55:06 EDT Ventricular Rate:  46 PR Interval:  166 QRS Duration: 85 QT Interval:   417 QTC Calculation: 365 R Axis:   59 Text Interpretation:  Sinus bradycardia RSR' in V1 or V2, probably normal  variant Nonspecific T abnrm, anterolateral leads ST elev, probable normal  early repol pattern No significant change since last tracing Confirmed by  POLLINA  MD, CHRISTOPHER [id number]) on 02/24/2015 3:44:20 PM      MDM  49 yo male w/ PMHx of GERD, PUD, & CAD presenting with epigastric pain. Onset x2 days ago after eating. Burning sensation. Worse post-prandially and with lying down. Nausea which is worse with increased pain. One episode of diarrhea today described as melanic. Denies SOB, emesis, lightheadedness, or diaphoresis.   Elderly male lying in stretcher in mild to moderate distress secondary to pain. Afebrile. Heart rate 50s (patient noted to be bradycardic previously). SBP 100s/60s. Lungs clear to auscultation bilaterally. Abdomen  notable for diffuse tenderness to palpation worse in epigastrium without guarding or rebound. No CVA tenderness.  EKG showing mild ST elevation in precordial leads without reciprocal changes which was noted on previous EKGs and is consistent with early repolarization - no evidence of T-wave changes or dysrhythmia. I-STAT troponin 0.03. Lactic acid 0.79. WBC 3.3. Hemoglobin 13.3. CMP completely unremarkable. Lipase 25. Chest x-ray showing no acute cardiopulmonary disease.  Patient given IV fluids and GI cocktail with near resolution in pain. Discussed above lab results with patient and the necessity to follow-up with his PCP to get on regular regimen of PPI at home at stronger dose.  Patient discharged home in stable condition. Strict ED return precautions discussed. Patient understands and agrees with plan has no further questions concerning this time.with above stated.  Patient care discussed with followed by my attending, Dr. Malachy Moan  Final diagnoses:  Gastritis  Epigastric pain  Nausea    Mayer Camel, MD 02/24/15  [id number]  Orpah Greek, MD 03/04/15 2054

## 2015-02-24 NOTE — ED Notes (Signed)
Pt reports epigastric pain that radiates to central chest x2 days.  Pt reports nausea and diarrhea but denies any vomiting, SOB, dizziness or diaphoresis.

## 2015-03-31 ENCOUNTER — Encounter (HOSPITAL_COMMUNITY): Payer: Self-pay | Admitting: Emergency Medicine

## 2015-03-31 ENCOUNTER — Emergency Department (HOSPITAL_COMMUNITY)
Admission: EM | Admit: 2015-03-31 | Discharge: 2015-03-31 | Disposition: A | Discharge status: Home / Self Care | Payer: Medicaid Other | Attending: Emergency Medicine | Admitting: Emergency Medicine

## 2015-03-31 DIAGNOSIS — K219 Gastro-esophageal reflux disease without esophagitis: Secondary | ICD-10-CM | POA: Insufficient documentation

## 2015-03-31 DIAGNOSIS — M158 Other polyosteoarthritis: Secondary | ICD-10-CM | POA: Insufficient documentation

## 2015-03-31 DIAGNOSIS — I2510 Atherosclerotic heart disease of native coronary artery without angina pectoris: Secondary | ICD-10-CM | POA: Insufficient documentation

## 2015-03-31 DIAGNOSIS — R109 Unspecified abdominal pain: Secondary | ICD-10-CM | POA: Diagnosis present

## 2015-03-31 DIAGNOSIS — Z720 Tobacco use: Secondary | ICD-10-CM | POA: Diagnosis not present

## 2015-03-31 DIAGNOSIS — R110 Nausea: Secondary | ICD-10-CM | POA: Diagnosis not present

## 2015-03-31 DIAGNOSIS — F319 Bipolar disorder, unspecified: Secondary | ICD-10-CM | POA: Diagnosis not present

## 2015-03-31 DIAGNOSIS — Z8701 Personal history of pneumonia (recurrent): Secondary | ICD-10-CM | POA: Diagnosis not present

## 2015-03-31 DIAGNOSIS — Z9889 Other specified postprocedural states: Secondary | ICD-10-CM | POA: Diagnosis not present

## 2015-03-31 DIAGNOSIS — Z79899 Other long term (current) drug therapy: Secondary | ICD-10-CM | POA: Diagnosis not present

## 2015-03-31 DIAGNOSIS — R1013 Epigastric pain: Secondary | ICD-10-CM | POA: Insufficient documentation

## 2015-03-31 LAB — COMPREHENSIVE METABOLIC PANEL
ALBUMIN: 4.1 g/dL (ref 3.5–5.0)
ALK PHOS: 53 U/L (ref 38–126)
ALT: 13 U/L — AB (ref 17–63)
ANION GAP: 6 (ref 5–15)
AST: 21 U/L (ref 15–41)
BILIRUBIN TOTAL: 0.6 mg/dL (ref 0.3–1.2)
BUN: 8 mg/dL (ref 6–20)
CALCIUM: 9.2 mg/dL (ref 8.9–10.3)
CO2: 27 mmol/L (ref 22–32)
CREATININE: 1.1 mg/dL (ref 0.61–1.24)
Chloride: 105 mmol/L (ref 101–111)
GFR calc Af Amer: >60 mL/min (ref >60)
GFR calc non Af Amer: >60 mL/min (ref >60)
GLUCOSE: 91 mg/dL (ref 65–99)
Potassium: 3.4 mmol/L — ABNORMAL LOW (ref 3.5–5.1)
Sodium: 138 mmol/L (ref 135–145)
TOTAL PROTEIN: 7.1 g/dL (ref 6.5–8.1)

## 2015-03-31 LAB — URINALYSIS, ROUTINE W REFLEX MICROSCOPIC
BILIRUBIN URINE: NEGATIVE
GLUCOSE, UA: NEGATIVE mg/dL
HGB URINE DIPSTICK: NEGATIVE
KETONES UR: NEGATIVE mg/dL
Leukocytes, UA: NEGATIVE
Nitrite: NEGATIVE
PROTEIN: NEGATIVE mg/dL
Specific Gravity, Urine: 1.003 — ABNORMAL LOW (ref 1.005–1.030)
UROBILINOGEN UA: 0.2 mg/dL (ref 0.0–1.0)
pH: 7 (ref 5.0–8.0)

## 2015-03-31 LAB — CBC
HCT: 40.5 % (ref 39.0–52.0)
HEMOGLOBIN: 14 g/dL (ref 13.0–17.0)
MCH: 31.3 pg (ref 26.0–34.0)
MCHC: 34.6 g/dL (ref 30.0–36.0)
MCV: 90.6 fL (ref 78.0–100.0)
PLATELETS: 181 10*3/uL (ref 150–400)
RBC: 4.47 MIL/uL (ref 4.22–5.81)
RDW: 13.2 % (ref 11.5–15.5)
WBC: 4.9 10*3/uL (ref 4.0–10.5)

## 2015-03-31 LAB — LIPASE, BLOOD: Lipase: 39 U/L (ref 11–51)

## 2015-03-31 MED ORDER — GI COCKTAIL ~~LOC~~
30.0000 mL | Freq: Once | ORAL | Status: AC
  Administered 2015-03-31: 30 mL via ORAL
  Filled 2015-03-31: qty 30

## 2015-03-31 MED ORDER — SUCRALFATE 1 G PO TABS: 1.0000 g | ORAL_TABLET | Freq: Three times a day (TID) | ORAL | Status: DC

## 2015-03-31 MED ORDER — PANTOPRAZOLE SODIUM 40 MG IV SOLR
40.0000 mg | Freq: Once | INTRAVENOUS | Status: AC
  Administered 2015-03-31: 40 mg via INTRAVENOUS
  Filled 2015-03-31: qty 40

## 2015-03-31 MED ORDER — SODIUM CHLORIDE 0.9 % IV BOLUS (SEPSIS)
1000.0000 mL | Freq: Once | INTRAVENOUS | Status: AC
  Administered 2015-03-31: 1000 mL via INTRAVENOUS

## 2015-03-31 MED ORDER — ONDANSETRON HCL 4 MG/2ML IJ SOLN
4.0000 mg | Freq: Once | INTRAMUSCULAR | Status: AC
  Administered 2015-03-31: 4 mg via INTRAVENOUS
  Filled 2015-03-31: qty 2

## 2015-03-31 NOTE — ED Notes (Signed)
Patient was alert, oriented and stable upon discharge. RN went over AVS and patient had no further questions.  

## 2015-03-31 NOTE — Discharge Instructions (Signed)
You were seen today for pain in her abdomen. This is likely related to reflux or peptic ulcers. Continue your proton pump inhibitor at home. He will also be given Carafate. You should follow-up with GI for formal endoscopy.  You should avoid alcohol or irritating foods.  Gastroesophageal Reflux Disease, Adult Normally, food travels down the esophagus and stays in the stomach to be digested. However, when a person has gastroesophageal reflux disease (GERD), food and stomach acid move back up into the esophagus. When this happens, the esophagus becomes sore and inflamed. Over time, GERD can create small holes (ulcers) in the lining of the esophagus.  CAUSES This condition is caused by a problem with the muscle between the esophagus and the stomach (lower esophageal sphincter, or LES). Normally, the LES muscle closes after food passes through the esophagus to the stomach. When the LES is weakened or abnormal, it does not close properly, and that allows food and stomach acid to go back up into the esophagus. The LES can be weakened by certain dietary substances, medicines, and medical conditions, including:  Tobacco use.  Pregnancy.  Having a hiatal hernia.  Heavy alcohol use.  Certain foods and beverages, such as coffee, chocolate, onions, and peppermint. RISK FACTORS This condition is more likely to develop in:  People who have an increased body weight.  People who have connective tissue disorders.  People who use NSAID medicines. SYMPTOMS Symptoms of this condition include:  Heartburn.  Difficult or painful swallowing.  The feeling of having a lump in the throat.  Abitter taste in the mouth.  Bad breath.  Having a large amount of saliva.  Having an upset or bloated stomach.  Belching.  Chest pain.  Shortness of breath or wheezing.  Ongoing (chronic) cough or a night-time cough.  Wearing away of tooth enamel.  Weight loss. Different conditions can cause chest pain.  Make sure to see your health care provider if you experience chest pain. DIAGNOSIS Your health care provider will take a medical history and perform a physical exam. To determine if you have mild or severe GERD, your health care provider may also monitor how you respond to treatment. You may also have other tests, including:  An endoscopy toexamine your stomach and esophagus with a small camera.  A test thatmeasures the acidity level in your esophagus.  A test thatmeasures how much pressure is on your esophagus.  A barium swallow or modified barium swallow to show the shape, size, and functioning of your esophagus. TREATMENT The goal of treatment is to help relieve your symptoms and to prevent complications. Treatment for this condition may vary depending on how severe your symptoms are. Your health care provider may recommend:  Changes to your diet.  Medicine.  Surgery. HOME CARE INSTRUCTIONS Diet  Follow a diet as recommended by your health care provider. This may involve avoiding foods and drinks such as:  Coffee and tea (with or without caffeine).  Drinks that containalcohol.  Energy drinks and sports drinks.  Carbonated drinks or sodas.  Chocolate and cocoa.  Peppermint and mint flavorings.  Garlic and onions.  Horseradish.  Spicy and acidic foods, including peppers, chili powder, curry powder, vinegar, hot sauces, and barbecue sauce.  Citrus fruit juices and citrus fruits, such as oranges, lemons, and limes.  Tomato-based foods, such as red sauce, chili, salsa, and pizza with red sauce.  Fried and fatty foods, such as donuts, french fries, potato chips, and high-fat dressings.  High-fat meats, such as hot  dogs and fatty cuts of red and white meats, such as rib eye steak, sausage, ham, and bacon.  High-fat dairy items, such as whole milk, butter, and cream cheese.  Eat small, frequent meals instead of large meals.  Avoid drinking large amounts of liquid  with your meals.  Avoid eating meals during the 2-3 hours before bedtime.  Avoid lying down right after you eat.  Do not exercise right after you eat. General Instructions  Pay attention to any changes in your symptoms.  Take over-the-counter and prescription medicines only as told by your health care provider. Do not take aspirin, ibuprofen, or other NSAIDs unless your health care provider told you to do so.  Do not use any tobacco products, including cigarettes, chewing tobacco, and e-cigarettes. If you need help quitting, ask your health care provider.  Wear loose-fitting clothing. Do not wear anything tight around your waist that causes pressure on your abdomen.  Raise (elevate) the head of your bed 6 inches (15cm).  Try to reduce your stress, such as with yoga or meditation. If you need help reducing stress, ask your health care provider.  If you are overweight, reduce your weight to an amount that is healthy for you. Ask your health care provider for guidance about a safe weight loss goal.  Keep all follow-up visits as told by your health care provider. This is important. SEEK MEDICAL CARE IF:  You have new symptoms.  You have unexplained weight loss.  You have difficulty swallowing, or it hurts to swallow.  You have wheezing or a persistent cough.  Your symptoms do not improve with treatment.  You have a hoarse voice. SEEK IMMEDIATE MEDICAL CARE IF:  You have pain in your arms, neck, jaw, teeth, or back.  You feel sweaty, dizzy, or light-headed.  You have chest pain or shortness of breath.  You vomit and your vomit looks like blood or coffee grounds.  You faint.  Your stool is bloody or black.  You cannot swallow, drink, or eat.   This information is not intended to replace advice given to you by your health care provider. Make sure you discuss any questions you have with your health care provider.   Document Released: 03/03/2005 Document Revised:  02/12/2015 Document Reviewed: 09/18/2014 Elsevier Interactive Patient Education [employer].

## 2015-03-31 NOTE — ED Provider Notes (Signed)
CSN: [id number]     Arrival date & time 03/31/15  0006 History   By signing my name below, I, [name] [name], attest that this documentation has been prepared under the direction and in the presence of [name] Hacker, MD.  Electronically Signed: Forrestine [name], ED Scribe. 03/31/2015. 12:29 AM.   Chief Complaint  Patient presents with  . Abdominal Pain   The history is provided by the patient. No language interpreter was used.    HPI Comments: [name] [name] is a 49 y.o. male with a PMHx of CAD who presents to the Emergency Department complaining of constant, ongoing abdominal pain x 2-3 days. Pain is described as cramping and currently rates pain 8/10. discomfort is made worse with deep breath. No alleviating factors at this time. OTC Maalox attempted at home with mild temporary improvement. Denies any fever, chills, nausea, vomiting, chest pain, or shortness of breath. Denies noting any blood in stools, however, pt states stools have been dark in the last few days. Mr. Commons was evaluated in the emergency department on 9/19 and was told symptoms were possible PUD. However, pt denies any recent follow up with GI or further testing to verify.  PCP: [name], MD   Past Medical History  Diagnosis Date  . Chest pain, mid sternal   . CAD in native artery     a. Nonobstructive cath 11/2007;  b. Presented with ST elevation - Nonobstructive cath 08/2011  . GERD (gastroesophageal reflux disease)   . Tobacco abuse   . Marijuana abuse     a. uses ~ 1x /wk or less  . History of cocaine abuse     a. quit ? 2009  . History of ETOH abuse     a. drinks 2 "40's" / wk  . Bradycardia     a. asymptomatic  . Bipolar disorder ([place])   . Syncope     a. 12/2010 - presumed to be vasovagal  . Abnormal ECG     a. early repolarization  . Coronary artery disease   . Pneumonia   . Stomach ulcer   . Arthritis     "my whole left side" (05/03/2014)   Past Surgical History  Procedure Laterality Date  .  Cardiac catheterization  2009; 08/2011    [name] 08/06/2011  . Left heart catheterization with coronary angiogram N/A 08/16/2011    Procedure: LEFT HEART CATHETERIZATION WITH CORONARY ANGIOGRAM;  Surgeon: [name], MD;  Location: [facility] CATH LAB;  Service: Cardiovascular;  Laterality: N/A;   No family history on file. Social History  Substance Use Topics  . Smoking status: Current Every Day Smoker -- 0.50 packs/day for 20 years    Types: Cigarettes  . Smokeless tobacco: Never Used  . Alcohol Use: 28.8 oz/week    48 Cans of beer per week     Comment: 05/03/2014 "2 40oz beers/day"    Review of Systems  Constitutional: Negative for fever and chills.  Respiratory: Negative for cough and shortness of breath.   Cardiovascular: Negative for chest pain.  Gastrointestinal: Positive for nausea and abdominal pain. Negative for vomiting.  Musculoskeletal: Negative for back pain.  Skin: Negative for rash.  Neurological: Negative for headaches.  Psychiatric/Behavioral: Negative for confusion.  All other systems reviewed and are negative.     Allergies  Aspirin; Pepperoni; Tomato; and Tylenol  Home Medications   Prior to Admission medications   Medication Sig Start Date End Date Taking? Authorizing Provider  ibuprofen (ADVIL,MOTRIN) 200 MG tablet Take  400 mg by mouth every 6 (six) hours as needed for mild pain.   Yes Historical Provider, MD  lidocaine (LIDODERM) 5 % Place 1 patch onto the skin daily. Remove & Discard patch within 12 hours or as directed by MD 01/26/15  Yes Kerrie Buffalo, NP  methocarbamol (ROBAXIN) 500 MG tablet Take 1 tablet (500 mg total) by mouth 3 (three) times daily. 01/26/15  Yes Kerrie Buffalo, NP  pantoprazole (PROTONIX) 40 MG tablet Take 1 tablet (40 mg total) by mouth daily. 01/26/15  Yes Kerrie Buffalo, NP  traZODone (DESYREL) 100 MG tablet Take 1 tablet (100 mg total) by mouth at bedtime. 01/26/15  Yes Kerrie Buffalo, NP  sucralfate (CARAFATE) 1 G tablet Take 1  tablet (1 g total) by mouth 4 (four) times daily -  with meals and at bedtime. 03/31/15   [name] Hacker, MD   Triage Vitals: BP 132/89 mmHg  Pulse 48  Temp(Src) 97.9 F (36.6 C) (Oral)  Resp 18  SpO2 100%   Physical Exam  Constitutional: He is oriented to person, place, and time. He appears well-developed and well-nourished. No distress.  thin  HENT:  Head: Normocephalic and atraumatic.  Eyes: Pupils are equal, round, and reactive to light.  Cardiovascular: Normal rate, regular rhythm and normal heart sounds.   No murmur heard. Pulmonary/Chest: Effort normal and breath sounds normal. No respiratory distress. He has no wheezes.  Abdominal: Soft. Bowel sounds are normal. There is tenderness. There is no rebound.  Mild tenderness to the epigastrium upon palpation, no rebound or guarding noted  Musculoskeletal: He exhibits no edema.  Neurological: He is alert and oriented to person, place, and time.  Skin: Skin is warm and dry.  Psychiatric: He has a normal mood and affect.  Nursing note and vitals reviewed.   ED Course  Procedures (including critical care time)  DIAGNOSTIC STUDIES: Oxygen Saturation is 100% on RA, Normal by my interpretation.    COORDINATION OF CARE: 12:12 AM- Will order lipase, CMP, CBC, urinalysis. Will give fluids, Zofran, Maalox, Protonix, and fluids. Discussed treatment plan with pt at bedside and pt agreed to plan.     Labs Review Labs Reviewed  COMPREHENSIVE METABOLIC PANEL - Abnormal; Notable for the following:    Potassium 3.4 (*)    ALT 13 (*)    All other components within normal limits  URINALYSIS, ROUTINE W REFLEX MICROSCOPIC (NOT AT [facility]) - Abnormal; Notable for the following:    APPearance CLOUDY (*)    Specific Gravity, Urine 1.003 (*)    All other components within normal limits  LIPASE, BLOOD  CBC    Imaging Review No results found. I have personally reviewed and evaluated these images and lab results as part of my medical  decision-making.   EKG Interpretation None      MDM   Final diagnoses:  Epigastric pain    Patient presents with epigastric pain. Similar to prior episodes of epigastric pain. Nontoxic on exam. Mildly tender. No signs of peritonitis. Patient given fluids, IV Protonix, and a GI cocktail. Lab work obtained and reassuring including lipase. On recheck, patient reports improvement of pain. Current pain is 2 out of 10. He is able to tolerate fluids. Will discharge home. Continue PPI at home. Will add Carafate. Patient provided with GI follow-up.  Suspect GERD versus gastritis versus peptic ulcer disease.  After history, exam, and medical workup I feel the patient has been appropriately medically screened and is safe for discharge home. Pertinent diagnoses were  discussed with the patient. Patient was given return precautions.  I personally performed the services described in this documentation, which was scribed in my presence. The recorded information has been reviewed and is accurate.    [name] Hacker, MD 03/31/15 [phone]

## 2015-03-31 NOTE — ED Notes (Signed)
Pt presents via EMS for abdominal pain x2-3days. Described as cramping.   Last VS 134/78, 54hr, 18resp

## 2015-03-31 NOTE — ED Notes (Signed)
Bed: WA04 Expected date:  Expected time:  Means of arrival:  Comments: 49 yo M abdominal pain

## 2015-06-28 ENCOUNTER — Encounter (HOSPITAL_BASED_OUTPATIENT_CLINIC_OR_DEPARTMENT_OTHER): Payer: Self-pay | Admitting: *Deleted

## 2015-06-28 ENCOUNTER — Emergency Department (HOSPITAL_BASED_OUTPATIENT_CLINIC_OR_DEPARTMENT_OTHER)
Admission: EM | Admit: 2015-06-28 | Discharge: 2015-06-28 | Disposition: A | Discharge status: Home / Self Care | Payer: Medicaid Other | Attending: Emergency Medicine | Admitting: Emergency Medicine

## 2015-06-28 DIAGNOSIS — Z87891 Personal history of nicotine dependence: Secondary | ICD-10-CM | POA: Insufficient documentation

## 2015-06-28 DIAGNOSIS — F319 Bipolar disorder, unspecified: Secondary | ICD-10-CM | POA: Insufficient documentation

## 2015-06-28 DIAGNOSIS — R42 Dizziness and giddiness: Secondary | ICD-10-CM | POA: Diagnosis not present

## 2015-06-28 DIAGNOSIS — K219 Gastro-esophageal reflux disease without esophagitis: Secondary | ICD-10-CM | POA: Insufficient documentation

## 2015-06-28 DIAGNOSIS — Z8739 Personal history of other diseases of the musculoskeletal system and connective tissue: Secondary | ICD-10-CM | POA: Insufficient documentation

## 2015-06-28 DIAGNOSIS — Z8701 Personal history of pneumonia (recurrent): Secondary | ICD-10-CM | POA: Diagnosis not present

## 2015-06-28 DIAGNOSIS — R55 Syncope and collapse: Secondary | ICD-10-CM | POA: Diagnosis present

## 2015-06-28 DIAGNOSIS — Z79899 Other long term (current) drug therapy: Secondary | ICD-10-CM | POA: Diagnosis not present

## 2015-06-28 DIAGNOSIS — R110 Nausea: Secondary | ICD-10-CM | POA: Insufficient documentation

## 2015-06-28 DIAGNOSIS — R51 Headache: Secondary | ICD-10-CM | POA: Diagnosis not present

## 2015-06-28 DIAGNOSIS — I2510 Atherosclerotic heart disease of native coronary artery without angina pectoris: Secondary | ICD-10-CM | POA: Insufficient documentation

## 2015-06-28 LAB — CBC
HCT: 39.5 % (ref 39.0–52.0)
Hemoglobin: 13.5 g/dL (ref 13.0–17.0)
MCH: 30.8 pg (ref 26.0–34.0)
MCHC: 34.2 g/dL (ref 30.0–36.0)
MCV: 90 fL (ref 78.0–100.0)
Platelets: 205 10*3/uL (ref 150–400)
RBC: 4.39 MIL/uL (ref 4.22–5.81)
RDW: 13.3 % (ref 11.5–15.5)
WBC: 4.2 10*3/uL (ref 4.0–10.5)

## 2015-06-28 LAB — BASIC METABOLIC PANEL
Anion gap: 6 (ref 5–15)
BUN: 10 mg/dL (ref 6–20)
CALCIUM: 9 mg/dL (ref 8.9–10.3)
CHLORIDE: 104 mmol/L (ref 101–111)
CO2: 29 mmol/L (ref 22–32)
CREATININE: 1.14 mg/dL (ref 0.61–1.24)
GFR calc Af Amer: >60 mL/min (ref >60)
GFR calc non Af Amer: >60 mL/min (ref >60)
GLUCOSE: 79 mg/dL (ref 65–99)
Potassium: 4.3 mmol/L (ref 3.5–5.1)
Sodium: 139 mmol/L (ref 135–145)

## 2015-06-28 LAB — CBG MONITORING, ED: GLUCOSE-CAPILLARY: 108 mg/dL — AB (ref 65–99)

## 2015-06-28 NOTE — ED Notes (Signed)
Pt reports feeling dizzy after breakfast yesterday. Dizziness progressively got worse, states "spinning" with standing up. Nausea since yesterday. States felt better this morning but symptoms have gotten worse as day has progressed. States has not taken home meds in over 1 year

## 2015-06-28 NOTE — ED Notes (Signed)
Pt states yesterday am began having a HA and felt light headiness. States went to his room and rested and took an Advil and issued resolved. Today episode presented again.

## 2015-06-28 NOTE — ED Notes (Signed)
Pt is from [facility]

## 2015-06-28 NOTE — ED Notes (Signed)
Pt placed on cont cardiac monitoring with cont POX and int NBP monitoring, orders rec from EDP and implemented

## 2015-06-28 NOTE — ED Provider Notes (Signed)
CSN: [id number]     Arrival date & time 06/28/15  1743 History  By signing my name below, I, Altamease Oiler, attest that this documentation has been prepared under the direction and in the presence of Elnora Morrison, MD. Electronically Signed: Altamease Oiler, ED Scribe. 06/28/2015. 6:37 PM   Chief Complaint  Patient presents with  . Near Syncope   The history is provided by the patient. No language interpreter was used.   [name] [name] is a 50 y.o. male with history of CAD, GERD, polysubstance abuse, and bipolar disorder who presents to the Emergency Department complaining of a near syncopal episode with onset this afternoon. Pt states that he has been intermittently lightheaded and dizzy since yesterday but today he almost fainted. Associated symptoms include headache and nausea. Pt denies LOC, hematochezia, dark stools, chest pain, SOB, vomiting, abdominal pain, focal weakness, numbness. He is currently receiving inpatient alcohol abuse treatment at [facility] and does not believe that he is well-hydrated. He last used alcohol 06/22/15. Pt is a smoker.    Past Medical History  Diagnosis Date  . Chest pain, mid sternal   . CAD in native artery     a. Nonobstructive cath 11/2007;  b. Presented with ST elevation - Nonobstructive cath 08/2011  . GERD (gastroesophageal reflux disease)   . Tobacco abuse   . Marijuana abuse     a. uses ~ 1x /wk or less  . History of cocaine abuse     a. quit ? 2009  . History of ETOH abuse     a. drinks 2 "40's" / wk  . Bradycardia     a. asymptomatic  . Bipolar disorder ([place])   . Syncope     a. 12/2010 - presumed to be vasovagal  . Abnormal ECG     a. early repolarization  . Coronary artery disease   . Pneumonia   . Stomach ulcer   . Arthritis     "my whole left side" (05/03/2014)   Past Surgical History  Procedure Laterality Date  . Cardiac catheterization  2009; 08/2011    [name] 08/06/2011  . Left heart catheterization with coronary angiogram N/A  08/16/2011    Procedure: LEFT HEART CATHETERIZATION WITH CORONARY ANGIOGRAM;  Surgeon: [name], MD;  Location: [facility] CATH LAB;  Service: Cardiovascular;  Laterality: N/A;   No family history on file. Social History  Substance Use Topics  . Smoking status: Former Smoker -- 0.50 packs/day for 20 years    Types: Cigarettes    Quit date: 06/24/2015  . Smokeless tobacco: Never Used  . Alcohol Use: 28.8 oz/week    48 Cans of beer per week     Comment: heavy drinker "until Jan 17"    Review of Systems  Respiratory: Negative for shortness of breath.   Cardiovascular: Positive for near-syncope. Negative for chest pain.  Gastrointestinal: Positive for nausea. Negative for vomiting, abdominal pain and blood in stool.  Neurological: Positive for dizziness, light-headedness and headaches. Negative for syncope, weakness and numbness.  All other systems reviewed and are negative.  Allergies  Aspirin; Pepperoni; Tomato; and Tylenol  Home Medications   Prior to Admission medications   Medication Sig Start Date End Date Taking? Authorizing Provider  ibuprofen (ADVIL,MOTRIN) 200 MG tablet Take 400 mg by mouth every 6 (six) hours as needed for mild pain.    Historical Provider, MD  lidocaine (LIDODERM) 5 % Place 1 patch onto the skin daily. Remove & Discard patch within 12 hours or  as directed by MD 01/26/15   Kerrie Buffalo, NP  methocarbamol (ROBAXIN) 500 MG tablet Take 1 tablet (500 mg total) by mouth 3 (three) times daily. 01/26/15   Kerrie Buffalo, NP  pantoprazole (PROTONIX) 40 MG tablet Take 1 tablet (40 mg total) by mouth daily. 01/26/15   Kerrie Buffalo, NP  sucralfate (CARAFATE) 1 G tablet Take 1 tablet (1 g total) by mouth 4 (four) times daily -  with meals and at bedtime. 03/31/15   [name], MD  traZODone (DESYREL) 100 MG tablet Take 1 tablet (100 mg total) by mouth at bedtime. 01/26/15   Kerrie Buffalo, NP   BP 120/71 mmHg  Pulse 55  Temp(Src) 98.7 F (37.1 C) (Oral)   Resp 18  Ht 5\' 4"  (1.626 m)  Wt 131 lb (59.421 kg)  BMI 22.47 kg/m2  SpO2 100% Physical Exam  Constitutional: He is oriented to person, place, and time. He appears well-developed and well-nourished.  HENT:  Head: Normocephalic and atraumatic.  Eyes: Conjunctivae and EOM are normal. Right eye exhibits no discharge. Left eye exhibits no discharge. Right eye exhibits no nystagmus. Left eye exhibits no nystagmus.  Neck: Normal range of motion. Neck supple. No tracheal deviation present.  Cardiovascular: Regular rhythm.   Pulmonary/Chest: Effort normal and breath sounds normal.  Abdominal: Soft. He exhibits no distension. There is no tenderness. There is no guarding.  Musculoskeletal: Normal range of motion. He exhibits no edema.  Neurological: He is alert and oriented to person, place, and time. GCS eye subscore is 4. GCS verbal subscore is 5. GCS motor subscore is 6.  No facial droop Visual fields intact to finger Finger nose intact Equal 5+ strength in all 4 extremities Sensation grossly intact Normal gait Negative Romberg  Skin: Skin is warm. No rash noted.  Psychiatric: He has a normal mood and affect.  Nursing note and vitals reviewed.   ED Course  Procedures (including critical care time) DIAGNOSTIC STUDIES: Oxygen Saturation is 100% on RA, normal by my interpretation.    COORDINATION OF CARE: 6:34 PM Discussed treatment plan which includes lab work and EKG with pt at bedside and pt agreed to plan.  Labs Review Labs Reviewed  CBG MONITORING, ED - Abnormal; Notable for the following:    Glucose-Capillary 108 (*)    All other components within normal limits  BASIC METABOLIC PANEL  CBC  URINALYSIS, ROUTINE W REFLEX MICROSCOPIC (NOT AT [facility])    Imaging Review No results found. I have personally reviewed and evaluated these lab results as part of my medical decision-making.   EKG Interpretation   Date/Time:  Saturday June 28 2015 18:00:25 EST Ventricular Rate:   55 PR Interval:  164 QRS Duration: 86 QT Interval:  378 QTC Calculation: 361 R Axis:   56 Text Interpretation:  Sinus bradycardia ST elevation, consider early  repolarization Borderline ECG Similar to previous Confirmed by Kenny Rea  MD,  [place] ([id number]) on 06/28/2015 6:13:36 PM      MDM   Final diagnoses:  Lightheadedness   Well-appearing male presents with intermittent lightheadedness and dizziness. Mild gradual onset headache minimal. Normal neurologic exam, well-appearing, no chest pain or shortness of breath. Blood work reviewed unremarkable. EKG similar to previous. No chest pain in the ER. Patient stable for outpatient follow-up. Stressed importance of staying hydrated and strict reasons to return for other symptoms.  Results and differential diagnosis were discussed with the patient/parent/guardian. Xrays were independently reviewed by myself.  Close follow up outpatient was discussed, comfortable  with the plan.   Medications - No data to display  Filed Vitals:   06/28/15 1752 06/28/15 1826  BP: 120/71 115/75  Pulse: 55 54  Temp: 98.7 F (37.1 C)   TempSrc: Oral   Resp: 18 18  Height: 5\' 4"  (1.626 m)   Weight: 131 lb (59.421 kg)   SpO2: 100% 100%    Final diagnoses:  Lightheadedness      Elnora Morrison, MD 06/28/15 1924

## 2015-06-28 NOTE — ED Notes (Signed)
Denies any chest pain or SOB with this episode

## 2015-06-28 NOTE — ED Notes (Signed)
[facility] called to notify pt is d/c and ready for pick up. Pt waiting in lobby at this time in view of nurse first

## 2015-06-28 NOTE — Discharge Instructions (Signed)
Stay well hydrated with water.  If you were given medicines take as directed.  If you are on coumadin or contraceptives realize their levels and effectiveness is altered by many different medicines.  If you have any reaction (rash, tongues swelling, other) to the medicines stop taking and see a physician.    If your blood pressure was elevated in the ER make sure you follow up for management with a primary doctor or return for chest pain, shortness of breath or stroke symptoms.  Please follow up as directed and return to the ER or see a physician for new or worsening symptoms.  Thank you. Filed Vitals:   06/28/15 1752 06/28/15 1826  BP: 120/71 115/75  Pulse: 55 54  Temp: 98.7 F (37.1 C)   TempSrc: Oral   Resp: 18 18  Height: 5\' 4"  (1.626 m)   Weight: 131 lb (59.421 kg)   SpO2: 100% 100%

## 2015-09-21 ENCOUNTER — Emergency Department (HOSPITAL_COMMUNITY): Payer: Medicaid Other

## 2015-09-21 ENCOUNTER — Emergency Department (HOSPITAL_COMMUNITY)
Admission: EM | Admit: 2015-09-21 | Discharge: 2015-09-21 | Disposition: A | Discharge status: Home / Self Care | Payer: Medicaid Other | Attending: Emergency Medicine | Admitting: Emergency Medicine

## 2015-09-21 ENCOUNTER — Encounter (HOSPITAL_COMMUNITY): Payer: Self-pay | Admitting: Emergency Medicine

## 2015-09-21 DIAGNOSIS — R079 Chest pain, unspecified: Secondary | ICD-10-CM | POA: Diagnosis present

## 2015-09-21 DIAGNOSIS — Z8659 Personal history of other mental and behavioral disorders: Secondary | ICD-10-CM | POA: Diagnosis not present

## 2015-09-21 DIAGNOSIS — F1310 Sedative, hypnotic or anxiolytic abuse, uncomplicated: Secondary | ICD-10-CM | POA: Insufficient documentation

## 2015-09-21 DIAGNOSIS — Z8701 Personal history of pneumonia (recurrent): Secondary | ICD-10-CM | POA: Diagnosis not present

## 2015-09-21 DIAGNOSIS — F1110 Opioid abuse, uncomplicated: Secondary | ICD-10-CM | POA: Insufficient documentation

## 2015-09-21 DIAGNOSIS — K219 Gastro-esophageal reflux disease without esophagitis: Secondary | ICD-10-CM | POA: Diagnosis not present

## 2015-09-21 DIAGNOSIS — I2510 Atherosclerotic heart disease of native coronary artery without angina pectoris: Secondary | ICD-10-CM | POA: Diagnosis not present

## 2015-09-21 DIAGNOSIS — M1990 Unspecified osteoarthritis, unspecified site: Secondary | ICD-10-CM | POA: Diagnosis not present

## 2015-09-21 DIAGNOSIS — Z791 Long term (current) use of non-steroidal anti-inflammatories (NSAID): Secondary | ICD-10-CM | POA: Diagnosis not present

## 2015-09-21 LAB — COMPREHENSIVE METABOLIC PANEL
ALT: 23 U/L (ref 17–63)
ANION GAP: 11 (ref 5–15)
AST: 38 U/L (ref 15–41)
Albumin: 4 g/dL (ref 3.5–5.0)
Alkaline Phosphatase: 63 U/L (ref 38–126)
BILIRUBIN TOTAL: 0.8 mg/dL (ref 0.3–1.2)
BUN: 5 mg/dL — ABNORMAL LOW (ref 6–20)
CO2: 22 mmol/L (ref 22–32)
Calcium: 9 mg/dL (ref 8.9–10.3)
Chloride: 106 mmol/L (ref 101–111)
Creatinine, Ser: 1.1 mg/dL (ref 0.61–1.24)
GLUCOSE: 77 mg/dL (ref 65–99)
POTASSIUM: 4.2 mmol/L (ref 3.5–5.1)
Sodium: 139 mmol/L (ref 135–145)
Total Protein: 7.2 g/dL (ref 6.5–8.1)

## 2015-09-21 LAB — I-STAT TROPONIN, ED
TROPONIN I, POC: 0 ng/mL (ref 0.00–0.08)
Troponin i, poc: 0 ng/mL (ref 0.00–0.08)

## 2015-09-21 LAB — RAPID URINE DRUG SCREEN, HOSP PERFORMED
Amphetamines: NOT DETECTED
Barbiturates: NOT DETECTED
Benzodiazepines: NOT DETECTED
Cocaine: NOT DETECTED
OPIATES: POSITIVE — AB
TETRAHYDROCANNABINOL: POSITIVE — AB

## 2015-09-21 LAB — URINALYSIS, ROUTINE W REFLEX MICROSCOPIC
Bilirubin Urine: NEGATIVE
Glucose, UA: NEGATIVE mg/dL
Hgb urine dipstick: NEGATIVE
KETONES UR: NEGATIVE mg/dL
LEUKOCYTES UA: NEGATIVE
NITRITE: NEGATIVE
PH: 6 (ref 5.0–8.0)
Protein, ur: NEGATIVE mg/dL
SPECIFIC GRAVITY, URINE: 1.013 (ref 1.005–1.030)

## 2015-09-21 LAB — LIPASE, BLOOD: Lipase: 30 U/L (ref 11–51)

## 2015-09-21 LAB — CBC
HCT: 40.9 % (ref 39.0–52.0)
HEMOGLOBIN: 14.3 g/dL (ref 13.0–17.0)
MCH: 31.2 pg (ref 26.0–34.0)
MCHC: 35 g/dL (ref 30.0–36.0)
MCV: 89.3 fL (ref 78.0–100.0)
PLATELETS: 224 10*3/uL (ref 150–400)
RBC: 4.58 MIL/uL (ref 4.22–5.81)
RDW: 13.9 % (ref 11.5–15.5)
WBC: 3.3 10*3/uL — ABNORMAL LOW (ref 4.0–10.5)

## 2015-09-21 MED ORDER — SODIUM CHLORIDE 0.9 % IV BOLUS (SEPSIS)
500.0000 mL | Freq: Once | INTRAVENOUS | Status: AC
  Administered 2015-09-21: 500 mL via INTRAVENOUS

## 2015-09-21 MED ORDER — PANTOPRAZOLE SODIUM 20 MG PO TBEC: 20.0000 mg | DELAYED_RELEASE_TABLET | Freq: Every day | ORAL | Status: DC

## 2015-09-21 MED ORDER — MORPHINE SULFATE (PF) 4 MG/ML IV SOLN
4.0000 mg | Freq: Once | INTRAVENOUS | Status: AC
Start: 2015-09-21 — End: 2015-09-21
  Administered 2015-09-21: 4 mg via INTRAVENOUS
  Filled 2015-09-21: qty 1

## 2015-09-21 MED ORDER — GI COCKTAIL ~~LOC~~
30.0000 mL | Freq: Once | ORAL | Status: AC
  Administered 2015-09-21: 30 mL via ORAL
  Filled 2015-09-21: qty 30

## 2015-09-21 MED ORDER — ASPIRIN 81 MG PO CHEW
324.0000 mg | CHEWABLE_TABLET | Freq: Once | ORAL | Status: AC
  Administered 2015-09-21: 324 mg via ORAL
  Filled 2015-09-21: qty 4

## 2015-09-21 MED ORDER — IPRATROPIUM-ALBUTEROL 0.5-2.5 (3) MG/3ML IN SOLN
3.0000 mL | Freq: Once | RESPIRATORY_TRACT | Status: AC
  Administered 2015-09-21: 3 mL via RESPIRATORY_TRACT
  Filled 2015-09-21: qty 3

## 2015-09-21 MED ORDER — SUCRALFATE 1 G PO TABS: 1.0000 g | ORAL_TABLET | Freq: Three times a day (TID) | ORAL | Status: DC

## 2015-09-21 NOTE — ED Notes (Signed)
Pt c/o chest pain which started yesterday around noon, non radiating. Pt denies N/V. Pt denies SOB.

## 2015-09-21 NOTE — ED Notes (Signed)
All intervention performed by [facility] Student Nurse were done under direct supervision of Carlyn Reichert RN

## 2015-09-21 NOTE — Discharge Instructions (Signed)
Gastritis, Adult Gastritis is soreness and swelling (inflammation) of the lining of the stomach. Gastritis can develop as a sudden onset (acute) or long-term (chronic) condition. If gastritis is not treated, it can lead to stomach bleeding and ulcers. CAUSES  Gastritis occurs when the stomach lining is weak or damaged. Digestive juices from the stomach then inflame the weakened stomach lining. The stomach lining may be weak or damaged due to viral or bacterial infections. One common bacterial infection is the Helicobacter pylori infection. Gastritis can also result from excessive alcohol consumption, taking certain medicines, or having too much acid in the stomach.  SYMPTOMS  In some cases, there are no symptoms. When symptoms are present, they may include:  Pain or a burning sensation in the upper abdomen.  Nausea.  Vomiting.  An uncomfortable feeling of fullness after eating. DIAGNOSIS  Your caregiver may suspect you have gastritis based on your symptoms and a physical exam. To determine the cause of your gastritis, your caregiver may perform the following:  Blood or stool tests to check for the H pylori bacterium.  Gastroscopy. A thin, flexible tube (endoscope) is passed down the esophagus and into the stomach. The endoscope has a light and camera on the end. Your caregiver uses the endoscope to view the inside of the stomach.  Taking a tissue sample (biopsy) from the stomach to examine under a microscope. TREATMENT  Depending on the cause of your gastritis, medicines may be prescribed. If you have a bacterial infection, such as an H pylori infection, antibiotics may be given. If your gastritis is caused by too much acid in the stomach, H2 blockers or antacids may be given. Your caregiver may recommend that you stop taking aspirin, ibuprofen, or other nonsteroidal anti-inflammatory drugs (NSAIDs). HOME CARE INSTRUCTIONS  Only take over-the-counter or prescription medicines as directed by  your caregiver.  If you were given antibiotic medicines, take them as directed. Finish them even if you start to feel better.  Drink enough fluids to keep your urine clear or pale yellow.  Avoid foods and drinks that make your symptoms worse, such as:  Caffeine or alcoholic drinks.  Chocolate.  Peppermint or mint flavorings.  Garlic and onions.  Spicy foods.  Citrus fruits, such as oranges, lemons, or limes.  Tomato-based foods such as sauce, chili, salsa, and pizza.  Fried and fatty foods.  Eat small, frequent meals instead of large meals. SEEK IMMEDIATE MEDICAL CARE IF:   You have black or dark red stools.  You vomit blood or material that looks like coffee grounds.  You are unable to keep fluids down.  Your abdominal pain gets worse.  You have a fever.  You do not feel better after 1 week.  You have any other questions or concerns. MAKE SURE YOU:  Understand these instructions.  Will watch your condition.  Will get help right away if you are not doing well or get worse.   This information is not intended to replace advice given to you by your health care provider. Make sure you discuss any questions you have with your health care provider.   Document Released: 05/18/2001 Document Revised: 11/23/2011 Document Reviewed: 07/07/2011 Elsevier Interactive Patient Education 2016 [employer]  Nonspecific Chest Pain  Chest pain can be caused by many different conditions. There is always a chance that your pain could be related to something serious, such as a heart attack or a blood clot in your lungs. Chest pain can also be caused by conditions that are  not life-threatening. If you have chest pain, it is very important to follow up with your health care provider. CAUSES  Chest pain can be caused by:  Heartburn.  Pneumonia or bronchitis.  Anxiety or stress.  Inflammation around your heart (pericarditis) or lung (pleuritis or pleurisy).  A blood clot in  your lung.  A collapsed lung (pneumothorax). It can develop suddenly on its own (spontaneous pneumothorax) or from trauma to the chest.  Shingles infection (varicella-zoster virus).  Heart attack.  Damage to the bones, muscles, and cartilage that make up your chest wall. This can include:  Bruised bones due to injury.  Strained muscles or cartilage due to frequent or repeated coughing or overwork.  Fracture to one or more ribs.  Sore cartilage due to inflammation (costochondritis). RISK FACTORS  Risk factors for chest pain may include:  Activities that increase your risk for trauma or injury to your chest.  Respiratory infections or conditions that cause frequent coughing.  Medical conditions or overeating that can cause heartburn.  Heart disease or family history of heart disease.  Conditions or health behaviors that increase your risk of developing a blood clot.  Having had chicken pox (varicella zoster). SIGNS AND SYMPTOMS Chest pain can feel like:  Burning or tingling on the surface of your chest or deep in your chest.  Crushing, pressure, aching, or squeezing pain.  Dull or sharp pain that is worse when you move, cough, or take a deep breath.  Pain that is also felt in your back, neck, shoulder, or arm, or pain that spreads to any of these areas. Your chest pain may come and go, or it may stay constant. DIAGNOSIS Lab tests or other studies may be needed to find the cause of your pain. Your health care provider may have you take a test called an ambulatory ECG (electrocardiogram). An ECG records your heartbeat patterns at the time the test is performed. You may also have other tests, such as:  Transthoracic echocardiogram (TTE). During echocardiography, sound waves are used to create a picture of all of the heart structures and to look at how blood flows through your heart.  Transesophageal echocardiogram (TEE).This is a more advanced imaging test that obtains  images from inside your body. It allows your health care provider to see your heart in finer detail.  Cardiac monitoring. This allows your health care provider to monitor your heart rate and rhythm in real time.  Holter monitor. This is a portable device that records your heartbeat and can help to diagnose abnormal heartbeats. It allows your health care provider to track your heart activity for several days, if needed.  Stress tests. These can be done through exercise or by taking medicine that makes your heart beat more quickly.  Blood tests.  Imaging tests. TREATMENT  Your treatment depends on what is causing your chest pain. Treatment may include:  Medicines. These may include:  Acid blockers for heartburn.  Anti-inflammatory medicine.  Pain medicine for inflammatory conditions.  Antibiotic medicine, if an infection is present.  Medicines to dissolve blood clots.  Medicines to treat coronary artery disease.  Supportive care for conditions that do not require medicines. This may include:  Resting.  Applying heat or cold packs to injured areas.  Limiting activities until pain decreases. HOME CARE INSTRUCTIONS  If you were prescribed an antibiotic medicine, finish it all even if you start to feel better.  Avoid any activities that bring on chest pain.  Do not use any tobacco  products, including cigarettes, chewing tobacco, or electronic cigarettes. If you need help quitting, ask your health care provider.  Do not drink alcohol.  Take medicines only as directed by your health care provider.  Keep all follow-up visits as directed by your health care provider. This is important. This includes any further testing if your chest pain does not go away.  If heartburn is the cause for your chest pain, you may be told to keep your head raised (elevated) while sleeping. This reduces the chance that acid will go from your stomach into your esophagus.  Make lifestyle changes as  directed by your health care provider. These may include:  Getting regular exercise. Ask your health care provider to suggest some activities that are safe for you.  Eating a heart-healthy diet. A registered dietitian can help you to learn healthy eating options.  Maintaining a healthy weight.  Managing diabetes, if necessary.  Reducing stress. SEEK MEDICAL CARE IF:  Your chest pain does not go away after treatment.  You have a rash with blisters on your chest.  You have a fever. SEEK IMMEDIATE MEDICAL CARE IF:   Your chest pain is worse.  You have an increasing cough, or you cough up blood.  You have severe abdominal pain.  You have severe weakness.  You faint.  You have chills.  You have sudden, unexplained chest discomfort.  You have sudden, unexplained discomfort in your arms, back, neck, or jaw.  You have shortness of breath at any time.  You suddenly start to sweat, or your skin gets clammy.  You feel nauseous or you vomit.  You suddenly feel light-headed or dizzy.  Your heart begins to beat quickly, or it feels like it is skipping beats. These symptoms may represent a serious problem that is an emergency. Do not wait to see if the symptoms will go away. Get medical help right away. Call your local emergency services (911 in the [place] Do not drive yourself to the hospital.   This information is not intended to replace advice given to you by your health care provider. Make sure you discuss any questions you have with your health care provider.   Document Released: 03/03/2005 Document Revised: 06/14/2014 Document Reviewed: 12/28/2013 Elsevier Interactive Patient Education [employer].

## 2015-09-21 NOTE — ED Provider Notes (Signed)
CSN: [id number]     Arrival date & time 09/21/15  1005 History   First MD Initiated Contact with Patient 09/21/15 1012     Chief Complaint  Patient presents with  . Chest Pain     (Consider location/radiation/quality/duration/timing/severity/associated sxs/prior Treatment) HPI   [name] [name] is a 50 y.o. male with with PMHx of non-obstructive CAD of native artery, GERD, gastric ulcers, tobacco abuse, polysubstance abuse, bipolar, presents to the emergency Department with complaints of left lower CP, which began yesterday at noon while walking around in his yard.  Pain is described as a "pressure inside lungs", without radiation, has waxed and waned since its onset, at most lasting up to one hour at a time. Pain has come both while at rest and with walking, without any exacerbating or alleviating factors, pain does not change in relationship to eating, deep inspiration, exertion, rest or position. Chest pain is not associated with shortness of breath, nausea, vomiting, diaphoresis, lightheadedness.  He has a current smoker, both tobacco and marijuana, states he drinks sixpack of beer a day, he denies any cocaine use, denies any other illegal drug use. Approximately one week ago he states he had a "slight cold with change in weather" had some sneezing but he denies any productive cough, wheeze, fever, chills, sweats.  He denies any recent acid reflux or abdominal pain after eating. He denies any recent flares of pancreatitis. He denies abdominal pain, and nausea, vomiting, diarrhea.   Past Medical History  Diagnosis Date  . Chest pain, mid sternal   . CAD in native artery     a. Nonobstructive cath 11/2007;  b. Presented with ST elevation - Nonobstructive cath 08/2011  . GERD (gastroesophageal reflux disease)   . Tobacco abuse   . Marijuana abuse     a. uses ~ 1x /wk or less  . History of cocaine abuse     a. quit ? 2009  . History of ETOH abuse     a. drinks 2 "40's" / wk  . Bradycardia      a. asymptomatic  . Bipolar disorder ([place])   . Syncope     a. 12/2010 - presumed to be vasovagal  . Abnormal ECG     a. early repolarization  . Coronary artery disease   . Pneumonia   . Stomach ulcer   . Arthritis     "my whole left side" (05/03/2014)   Past Surgical History  Procedure Laterality Date  . Cardiac catheterization  2009; 08/2011    [name] 08/06/2011  . Left heart catheterization with coronary angiogram N/A 08/16/2011    Procedure: LEFT HEART CATHETERIZATION WITH CORONARY ANGIOGRAM;  Surgeon: [name], MD;  Location: [facility] CATH LAB;  Service: Cardiovascular;  Laterality: N/A;   No family history on file. Social History  Substance Use Topics  . Smoking status: Former Smoker -- 0.50 packs/day for 20 years    Types: Cigarettes    Quit date: 06/24/2015  . Smokeless tobacco: Never Used  . Alcohol Use: 28.8 oz/week    48 Cans of beer per week     Comment: heavy drinker "until Jan 17"    Review of Systems  All other systems reviewed and are negative.     Allergies  Aspirin; Pepperoni; Tomato; and Tylenol  Home Medications   Prior to Admission medications   Medication Sig Start Date End Date Taking? Authorizing Provider  diclofenac sodium (VOLTAREN) 1 % GEL Apply 4 g topically 2 (two) times daily.  Yes Historical Provider, MD  ibuprofen (ADVIL,MOTRIN) 200 MG tablet Take 400 mg by mouth every 6 (six) hours as needed for mild pain.   Yes Historical Provider, MD  pantoprazole (PROTONIX) 20 MG tablet Take 1 tablet (20 mg total) by mouth daily. 09/21/15   Delsa Grana, PA-C  sucralfate (CARAFATE) 1 g tablet Take 1 tablet (1 g total) by mouth 4 (four) times daily -  with meals and at bedtime. 09/21/15   Delsa Grana, PA-C   BP 107/65 mmHg  Pulse 47  Temp(Src) 97.5 F (36.4 C) (Oral)  Resp 18  SpO2 96% Physical Exam  Constitutional: He is oriented to person, place, and time. He appears well-developed and well-nourished. No distress.  Thin, chronically  ill-appearing male, no acute distress, nontoxic in appearance  HENT:  Head: Normocephalic and atraumatic.  Nose: Nose normal.  Mouth/Throat: Oropharynx is clear and moist. No oropharyngeal exudate.  Eyes: Conjunctivae and EOM are normal. Pupils are equal, round, and reactive to light. Right eye exhibits no discharge. Left eye exhibits no discharge. No scleral icterus.  Neck: Normal range of motion. No JVD present. No tracheal deviation present. No thyromegaly present.  Cardiovascular: Normal rate, regular rhythm, normal heart sounds and intact distal pulses.  Exam reveals no gallop and no friction rub.   No murmur heard. Pulmonary/Chest: Effort normal and breath sounds normal. No respiratory distress. He has no wheezes. He has no rales. He exhibits no tenderness.  Abdominal: Soft. Bowel sounds are normal. He exhibits no distension and no mass. There is no tenderness. There is no rebound and no guarding.  Musculoskeletal: Normal range of motion. He exhibits no edema or tenderness.  Lymphadenopathy:    He has no cervical adenopathy.  Neurological: He is alert and oriented to person, place, and time. He has normal reflexes. No cranial nerve deficit. He exhibits normal muscle tone. Coordination normal.  Skin: Skin is warm and dry. No rash noted. He is not diaphoretic. No erythema. No pallor.  Psychiatric: He has a normal mood and affect. His behavior is normal. Judgment and thought content normal.  Nursing note and vitals reviewed.   ED Course  Procedures (including critical care time) Labs Review Labs Reviewed  CBC - Abnormal; Notable for the following:    WBC 3.3 (*)    All other components within normal limits  COMPREHENSIVE METABOLIC PANEL - Abnormal; Notable for the following:    BUN 5 (*)    All other components within normal limits  URINE RAPID DRUG SCREEN, HOSP PERFORMED - Abnormal; Notable for the following:    Opiates POSITIVE (*)    Tetrahydrocannabinol POSITIVE (*)    All  other components within normal limits  LIPASE, BLOOD  URINALYSIS, ROUTINE W REFLEX MICROSCOPIC (NOT AT [facility])  I-STAT TROPOININ, ED  I-STAT TROPOININ, ED   Results for orders placed or performed during the hospital encounter of 09/21/15  CBC  Result Value Ref Range   WBC 3.3 (L) 4.0 - 10.5 K/uL   RBC 4.58 4.22 - 5.81 MIL/uL   Hemoglobin 14.3 13.0 - 17.0 g/dL   HCT 40.9 39.0 - 52.0 %   MCV 89.3 78.0 - 100.0 fL   MCH 31.2 26.0 - 34.0 pg   MCHC 35.0 30.0 - 36.0 g/dL   RDW 13.9 11.5 - 15.5 %   Platelets 224 150 - 400 K/uL  Comprehensive metabolic panel  Result Value Ref Range   Sodium 139 135 - 145 mmol/L   Potassium 4.2 3.5 - 5.1 mmol/L  Chloride 106 101 - 111 mmol/L   CO2 22 22 - 32 mmol/L   Glucose, Bld 77 65 - 99 mg/dL   BUN 5 (L) 6 - 20 mg/dL   Creatinine, Ser 1.10 0.61 - 1.24 mg/dL   Calcium 9.0 8.9 - 10.3 mg/dL   Total Protein 7.2 6.5 - 8.1 g/dL   Albumin 4.0 3.5 - 5.0 g/dL   AST 38 15 - 41 U/L   ALT 23 17 - 63 U/L   Alkaline Phosphatase 63 38 - 126 U/L   Total Bilirubin 0.8 0.3 - 1.2 mg/dL   GFR calc non Af Amer >60 >60 mL/min   GFR calc Af Amer >60 >60 mL/min   Anion gap 11 5 - 15  Lipase, blood  Result Value Ref Range   Lipase 30 11 - 51 U/L  Urinalysis, Routine w reflex microscopic (not at [facility])  Result Value Ref Range   Color, Urine YELLOW YELLOW   APPearance CLEAR CLEAR   Specific Gravity, Urine 1.013 1.005 - 1.030   pH 6.0 5.0 - 8.0   Glucose, UA NEGATIVE NEGATIVE mg/dL   Hgb urine dipstick NEGATIVE NEGATIVE   Bilirubin Urine NEGATIVE NEGATIVE   Ketones, ur NEGATIVE NEGATIVE mg/dL   Protein, ur NEGATIVE NEGATIVE mg/dL   Nitrite NEGATIVE NEGATIVE   Leukocytes, UA NEGATIVE NEGATIVE  Urine rapid drug screen (hosp performed)  Result Value Ref Range   Opiates POSITIVE (A) NONE DETECTED   Cocaine NONE DETECTED NONE DETECTED   Benzodiazepines NONE DETECTED NONE DETECTED   Amphetamines NONE DETECTED NONE DETECTED   Tetrahydrocannabinol POSITIVE (A) NONE  DETECTED   Barbiturates NONE DETECTED NONE DETECTED  I-stat troponin, ED  Result Value Ref Range   Troponin i, poc 0.00 0.00 - 0.08 ng/mL   Comment 3          I-stat troponin, ED  Result Value Ref Range   Troponin i, poc 0.00 0.00 - 0.08 ng/mL   Comment 3           Dg Chest 2 View  09/21/2015  CLINICAL DATA:  Left chest pain and tightness since yesterday. Smoker. EXAM: CHEST  2 VIEW COMPARISON:  02/24/2015. FINDINGS: Normal sized heart. Clear lungs. The lungs are mildly hyperexpanded. Unremarkable bones. IMPRESSION: No acute abnormality.  Mild changes of COPD. Electronically Signed   By: Claudie Revering M.D.   On: 09/21/2015 10:55     Imaging Review No results found. I have personally reviewed and evaluated these images and lab results as part of my medical decision-making.   EKG Interpretation   Date/Time:  Sunday September 21 2015 10:14:26 EDT Ventricular Rate:  57 PR Interval:  154 QRS Duration: 84 QT Interval:  402 QTC Calculation: 391 R Axis:   60 Text Interpretation:  Sinus bradycardia with sinus arrhythmia Otherwise  normal ECG Confirmed by Hazle Coca [phone]) on 09/21/2015 10:15:15 AM      MDM   50 year old male, current smoker with polysubstance abuse and EtOH abuse history, presents to emergency room with intermittent left lower chest pain began yesterday at noon.  Patient also has a history of gastritis/GERD, pancreatitis, bradycardia, CAD last cath was 2013. He denies any cocaine use recently. He denies any inspiratory quality to the pain. Denies any recent reflux symptoms.  Pain seems to wax and wane without any aggravating or alleviating factors.  Cardiac workup initiated upon presentation to the ER. I asked the patient if he is able to take aspirin, he stated yes, although he has  a documented allergy in the chart. He does not have a history of bleeding ulcers and has not had any epigastric pain recently. He will be given 325 of aspirin in the ER, feel that one time dose and  benefits in the setting of CP, benefits > risk, pt in agreement.  History of chest pain is not real suggestive of ACS, no exertional component to it.  EKG sinus brady without ST elevation.  He has a history of bradycardia, similar HR documented in chart, currently asymptomatic.  Initial troponin is negative and Delta troponin was negative.  Pulmonary embolism unlikely, patient has no shortness of breath, no tachycardia, is PERC negative with the exception of his age.  Chest x-ray shows bronchitic changes, no evidence of pneumonia, pneumothorax, pulmonary edema. On exam patient has no respiratory distress, no wheeze, no rhonchi, do not suspect CP is respiratory/pulmonary in nature.  Patient's chest pain was relieved with GI cocktail, may be recurrent GERD/gastritis/esophagitis vs peptic ulcer disease.  He has been able to eat and drink without difficulty.  Discussed treatment with PPIs and Carafate as needed. Will get GI referral for follow-up.  Results and plan reviewed with the pt, who reports complete relief of CP, agrees to plan.  Pt discharged in good condition.  Final diagnoses:  Left sided chest pain        Delsa Grana, PA-C 09/23/15 1306  Pattricia Boss, MD 09/23/15 [phone]

## 2015-11-21 DIAGNOSIS — Z87891 Personal history of nicotine dependence: Secondary | ICD-10-CM | POA: Insufficient documentation

## 2015-11-21 DIAGNOSIS — F315 Bipolar disorder, current episode depressed, severe, with psychotic features: Secondary | ICD-10-CM | POA: Insufficient documentation

## 2015-11-21 DIAGNOSIS — I2510 Atherosclerotic heart disease of native coronary artery without angina pectoris: Secondary | ICD-10-CM | POA: Insufficient documentation

## 2015-11-21 DIAGNOSIS — F1094 Alcohol use, unspecified with alcohol-induced mood disorder: Secondary | ICD-10-CM | POA: Insufficient documentation

## 2015-11-22 ENCOUNTER — Encounter (HOSPITAL_COMMUNITY): Payer: Self-pay | Admitting: *Deleted

## 2015-11-22 ENCOUNTER — Encounter (HOSPITAL_COMMUNITY): Payer: Self-pay | Admitting: Emergency Medicine

## 2015-11-22 ENCOUNTER — Inpatient Hospital Stay (HOSPITAL_COMMUNITY)
Admission: AD | Admit: 2015-11-22 | Discharge: 2015-11-26 | DRG: 885 | Disposition: A | Discharge status: Home / Self Care | Payer: Medicaid Other | Source: Intra-hospital | Attending: Psychiatry | Admitting: Psychiatry

## 2015-11-22 ENCOUNTER — Emergency Department (HOSPITAL_COMMUNITY)
Admission: EM | Admit: 2015-11-22 | Discharge: 2015-11-22 | Disposition: A | Discharge status: Other Acute Inpatient Hospital | Payer: Medicaid Other | Attending: Emergency Medicine | Admitting: Emergency Medicine

## 2015-11-22 DIAGNOSIS — Z87891 Personal history of nicotine dependence: Secondary | ICD-10-CM

## 2015-11-22 DIAGNOSIS — F314 Bipolar disorder, current episode depressed, severe, without psychotic features: Secondary | ICD-10-CM | POA: Diagnosis present

## 2015-11-22 DIAGNOSIS — F1410 Cocaine abuse, uncomplicated: Secondary | ICD-10-CM | POA: Diagnosis present

## 2015-11-22 DIAGNOSIS — F1210 Cannabis abuse, uncomplicated: Secondary | ICD-10-CM | POA: Diagnosis present

## 2015-11-22 DIAGNOSIS — F419 Anxiety disorder, unspecified: Secondary | ICD-10-CM | POA: Diagnosis present

## 2015-11-22 DIAGNOSIS — F10230 Alcohol dependence with withdrawal, uncomplicated: Secondary | ICD-10-CM | POA: Insufficient documentation

## 2015-11-22 DIAGNOSIS — Z590 Homelessness: Secondary | ICD-10-CM

## 2015-11-22 DIAGNOSIS — K219 Gastro-esophageal reflux disease without esophagitis: Secondary | ICD-10-CM | POA: Diagnosis present

## 2015-11-22 DIAGNOSIS — F1020 Alcohol dependence, uncomplicated: Secondary | ICD-10-CM

## 2015-11-22 DIAGNOSIS — I2510 Atherosclerotic heart disease of native coronary artery without angina pectoris: Secondary | ICD-10-CM | POA: Diagnosis present

## 2015-11-22 DIAGNOSIS — R45851 Suicidal ideations: Secondary | ICD-10-CM | POA: Diagnosis not present

## 2015-11-22 DIAGNOSIS — M1990 Unspecified osteoarthritis, unspecified site: Secondary | ICD-10-CM | POA: Diagnosis present

## 2015-11-22 DIAGNOSIS — G4700 Insomnia, unspecified: Secondary | ICD-10-CM | POA: Diagnosis present

## 2015-11-22 LAB — COMPREHENSIVE METABOLIC PANEL
ALBUMIN: 5.1 g/dL — AB (ref 3.5–5.0)
ALK PHOS: 64 U/L (ref 38–126)
ALT: 20 U/L (ref 17–63)
ANION GAP: 7 (ref 5–15)
AST: 25 U/L (ref 15–41)
BUN: 9 mg/dL (ref 6–20)
CALCIUM: 9.1 mg/dL (ref 8.9–10.3)
CHLORIDE: 108 mmol/L (ref 101–111)
CO2: 26 mmol/L (ref 22–32)
Creatinine, Ser: 1.01 mg/dL (ref 0.61–1.24)
GFR calc Af Amer: >60 mL/min (ref >60)
GFR calc non Af Amer: >60 mL/min (ref >60)
GLUCOSE: 87 mg/dL (ref 65–99)
Potassium: 3.7 mmol/L (ref 3.5–5.1)
SODIUM: 141 mmol/L (ref 135–145)
Total Bilirubin: 0.5 mg/dL (ref 0.3–1.2)
Total Protein: 8.3 g/dL — ABNORMAL HIGH (ref 6.5–8.1)

## 2015-11-22 LAB — CBC WITH DIFFERENTIAL/PLATELET
BASOS ABS: 0 10*3/uL (ref 0.0–0.1)
BASOS PCT: 0 %
Eosinophils Absolute: 0.1 10*3/uL (ref 0.0–0.7)
Eosinophils Relative: 1 %
HEMATOCRIT: 41.5 % (ref 39.0–52.0)
HEMOGLOBIN: 14.6 g/dL (ref 13.0–17.0)
Lymphocytes Relative: 42 %
Lymphs Abs: 3 10*3/uL (ref 0.7–4.0)
MCH: 31.7 pg (ref 26.0–34.0)
MCHC: 35.2 g/dL (ref 30.0–36.0)
MCV: 90 fL (ref 78.0–100.0)
MONO ABS: 0.4 10*3/uL (ref 0.1–1.0)
Monocytes Relative: 5 %
NEUTROS ABS: 3.7 10*3/uL (ref 1.7–7.7)
NEUTROS PCT: 52 %
Platelets: 256 10*3/uL (ref 150–400)
RBC: 4.61 MIL/uL (ref 4.22–5.81)
RDW: 12.9 % (ref 11.5–15.5)
WBC: 7.1 10*3/uL (ref 4.0–10.5)

## 2015-11-22 LAB — RAPID URINE DRUG SCREEN, HOSP PERFORMED
AMPHETAMINES: NOT DETECTED
BENZODIAZEPINES: NOT DETECTED
Barbiturates: NOT DETECTED
COCAINE: POSITIVE — AB
OPIATES: NOT DETECTED
TETRAHYDROCANNABINOL: POSITIVE — AB

## 2015-11-22 LAB — SALICYLATE LEVEL: Salicylate Lvl: <4 mg/dL (ref 2.8–30.0)

## 2015-11-22 LAB — ACETAMINOPHEN LEVEL: Acetaminophen (Tylenol), Serum: <10 ug/mL — ABNORMAL LOW (ref 10–30)

## 2015-11-22 LAB — ETHANOL: Alcohol, Ethyl (B): 129 mg/dL — ABNORMAL HIGH (ref <5)

## 2015-11-22 MED ORDER — LORAZEPAM 1 MG PO TABS
1.0000 mg | ORAL_TABLET | Freq: Four times a day (QID) | ORAL | Status: AC
  Administered 2015-11-22 – 2015-11-23 (×4): 1 mg via ORAL
  Filled 2015-11-22 (×4): qty 1

## 2015-11-22 MED ORDER — LORAZEPAM 1 MG PO TABS
1.0000 mg | ORAL_TABLET | Freq: Two times a day (BID) | ORAL | Status: DC
Start: 2015-11-24 — End: 2015-11-22

## 2015-11-22 MED ORDER — ONDANSETRON 4 MG PO TBDP: 4.0000 mg | ORAL_TABLET | Freq: Four times a day (QID) | ORAL | Status: DC | PRN

## 2015-11-22 MED ORDER — ALUM & MAG HYDROXIDE-SIMETH 200-200-20 MG/5ML PO SUSP
30.0000 mL | ORAL | Status: DC | PRN
  Administered 2015-11-26: 30 mL via ORAL
  Filled 2015-11-22: qty 30

## 2015-11-22 MED ORDER — LORAZEPAM 1 MG PO TABS: 1.0000 mg | ORAL_TABLET | Freq: Four times a day (QID) | ORAL | Status: DC | PRN

## 2015-11-22 MED ORDER — VITAMIN B-1 100 MG PO TABS
100.0000 mg | ORAL_TABLET | Freq: Every day | ORAL | Status: DC
  Administered 2015-11-23 – 2015-11-26 (×4): 100 mg via ORAL
  Filled 2015-11-22 (×6): qty 1

## 2015-11-22 MED ORDER — LORAZEPAM 1 MG PO TABS: 1.0000 mg | ORAL_TABLET | Freq: Every day | ORAL | Status: DC

## 2015-11-22 MED ORDER — LOPERAMIDE HCL 2 MG PO CAPS: 2.0000 mg | ORAL_CAPSULE | ORAL | Status: AC | PRN

## 2015-11-22 MED ORDER — LORAZEPAM 1 MG PO TABS: 1.0000 mg | ORAL_TABLET | Freq: Two times a day (BID) | ORAL | Status: DC

## 2015-11-22 MED ORDER — VITAMIN B-1 100 MG PO TABS: 100.0000 mg | ORAL_TABLET | Freq: Every day | ORAL | Status: DC

## 2015-11-22 MED ORDER — ADULT MULTIVITAMIN W/MINERALS CH: 1.0000 | ORAL_TABLET | Freq: Every day | ORAL | Status: DC

## 2015-11-22 MED ORDER — LORAZEPAM 1 MG PO TABS
1.0000 mg | ORAL_TABLET | Freq: Three times a day (TID) | ORAL | Status: DC
  Filled 2015-11-22: qty 1

## 2015-11-22 MED ORDER — LORAZEPAM 1 MG PO TABS: 1.0000 mg | ORAL_TABLET | Freq: Four times a day (QID) | ORAL | Status: DC

## 2015-11-22 MED ORDER — TRAZODONE HCL 50 MG PO TABS
50.0000 mg | ORAL_TABLET | Freq: Every evening | ORAL | Status: DC | PRN
  Administered 2015-11-22 – 2015-11-23 (×2): 50 mg via ORAL
  Filled 2015-11-22 (×7): qty 1

## 2015-11-22 MED ORDER — ONDANSETRON 4 MG PO TBDP: 4.0000 mg | ORAL_TABLET | Freq: Four times a day (QID) | ORAL | Status: AC | PRN

## 2015-11-22 MED ORDER — HYDROXYZINE HCL 25 MG PO TABS: 25.0000 mg | ORAL_TABLET | Freq: Four times a day (QID) | ORAL | Status: DC | PRN

## 2015-11-22 MED ORDER — LORAZEPAM 1 MG PO TABS: 1.0000 mg | ORAL_TABLET | Freq: Three times a day (TID) | ORAL | Status: DC

## 2015-11-22 MED ORDER — ACETAMINOPHEN 325 MG PO TABS
650.0000 mg | ORAL_TABLET | Freq: Once | ORAL | Status: AC
  Administered 2015-11-22: 650 mg via ORAL
  Filled 2015-11-22: qty 2

## 2015-11-22 MED ORDER — HYDROXYZINE HCL 25 MG PO TABS: 25.0000 mg | ORAL_TABLET | Freq: Four times a day (QID) | ORAL | Status: AC | PRN

## 2015-11-22 MED ORDER — ADULT MULTIVITAMIN W/MINERALS CH
1.0000 | ORAL_TABLET | Freq: Every day | ORAL | Status: DC
  Administered 2015-11-22 – 2015-11-26 (×5): 1 via ORAL
  Filled 2015-11-22 (×7): qty 1

## 2015-11-22 MED ORDER — MAGNESIUM HYDROXIDE 400 MG/5ML PO SUSP: 30.0000 mL | Freq: Every day | ORAL | Status: DC | PRN

## 2015-11-22 MED ORDER — LORAZEPAM 1 MG PO TABS: 1.0000 mg | ORAL_TABLET | Freq: Four times a day (QID) | ORAL | Status: AC | PRN

## 2015-11-22 MED ORDER — LOPERAMIDE HCL 2 MG PO CAPS: 2.0000 mg | ORAL_CAPSULE | ORAL | Status: DC | PRN

## 2015-11-22 MED ORDER — THIAMINE HCL 100 MG/ML IJ SOLN: 100.0000 mg | Freq: Once | INTRAMUSCULAR | Status: DC

## 2015-11-22 NOTE — [facility] Group Notes (Signed)
[place] Group Notes: (Clinical Social Work)   11/22/2015      Type of Therapy:  Group Therapy   Participation Level:  Did Not Attend despite MHT prompting   Selmer Dominion, LCSW 11/22/2015, 11:27 AM

## 2015-11-22 NOTE — Progress Notes (Signed)
Admission note: Patient reports that his stressors are his fiance and dealing with her health; patient also reports that she has not had any of his medications; Patient reports that it causes him to drink; self reports 3 forty ounces daily; patient also reports thc and cocaine use; patient denies any pain or withdrawal symptoms during admission

## 2015-11-22 NOTE — ED Notes (Signed)
He has eaten some breakfast and has no requests at this time.  He is calm and cooperative.

## 2015-11-22 NOTE — ED Notes (Signed)
Pt from home with complaints of depression. Pt denies SI,HI. Pt is tearful at time of assessment and states that he has had the stress of trying to care for his fiance who suffered a stroke 3 years ago. Pt states he has no family who he can talk to about his stress or depression. Pt states he has been unable to sleep for more than a few hours at a time "for as long as he can remember" Pt states that recently he has been experiencing auditory hallucinations recently, but he is unable to describe them

## 2015-11-22 NOTE — ED Notes (Signed)
Pt given sprite.  Pt made aware again of the need for a urine.

## 2015-11-22 NOTE — Progress Notes (Signed)
Patient did not attend the evening speaker AA meeting. Pt was notified that group was beginning but remained in bed.   
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
## 2015-11-22 NOTE — ED Notes (Signed)
I have just given report to [place], Therapist, sports at [facility]. Adult unit; and have just notified [facility] for tx there.  Pt. Remains cooperative and in no distress.

## 2015-11-22 NOTE — Tx Team (Signed)
Initial Interdisciplinary Treatment Plan   PATIENT STRESSORS: Medication change or noncompliance Substance abuse   PATIENT STRENGTHS: Average or above average intelligence Capable of independent living Physical Health Supportive family/friends   PROBLEM LIST: Problem List/Patient Goals Date to be addressed Date deferred Reason deferred Estimated date of resolution                                                         DISCHARGE CRITERIA:  Improved stabilization in mood, thinking, and/or behavior Need for constant or close observation no longer present Withdrawal symptoms are absent or subacute and managed without 24-hour nursing intervention  PRELIMINARY DISCHARGE PLAN: Outpatient therapy Return to previous living arrangement  PATIENT/FAMIILY INVOLVEMENT: This treatment plan has been presented to and reviewed with the patient, JARMEL KMETZ.  The patient and family have been given the opportunity to ask questions and make suggestions.  Marilynne Halsted M 11/22/2015, 12:57 PM

## 2015-11-23 DIAGNOSIS — F314 Bipolar disorder, current episode depressed, severe, without psychotic features: Principal | ICD-10-CM

## 2015-11-23 DIAGNOSIS — R45851 Suicidal ideations: Secondary | ICD-10-CM

## 2015-11-23 DIAGNOSIS — F10230 Alcohol dependence with withdrawal, uncomplicated: Secondary | ICD-10-CM

## 2015-11-23 MED ORDER — GABAPENTIN 100 MG PO CAPS
100.0000 mg | ORAL_CAPSULE | Freq: Three times a day (TID) | ORAL | Status: DC
  Administered 2015-11-23 – 2015-11-26 (×9): 100 mg via ORAL
  Filled 2015-11-23 (×17): qty 1

## 2015-11-23 MED ORDER — QUETIAPINE FUMARATE 100 MG PO TABS
100.0000 mg | ORAL_TABLET | Freq: Every day | ORAL | Status: DC
  Administered 2015-11-23: 100 mg via ORAL
  Filled 2015-11-23 (×2): qty 1
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
## 2015-11-23 NOTE — Progress Notes (Signed)
NSG 7a-7p shift:   D:  Pt. Has been very pleasant this shift but had not attended an earlier group due to somnolence after receiving scheduled ativan.  "I think it's because I'm not used to those pills yet; they really knock me out".  He smiles when he becomes nervous and bashfully avoids eye contact when asked questions about his problems prior to admission.  He denies any physical complaints at this time other than increased somnolence from his medications and a poor appetite "which gets a little better each day."`  A: Support, education, and encouragement provided as needed.  Level 3 checks continued for safety.  R: Pt. receptive to intervention/s.  Safety maintained.  Prudencio Pair, RN
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
## 2015-11-23 NOTE — Progress Notes (Signed)
Patient ID: [name] [name], male   DOB: 07/08/1965, 50 y.o.   MRN: [id number] D: Patient is a new admit in room most of the evening. Pt denies withdrawal symptoms. Pt is pleasant smiling during most of our conversation. Denies  SI/HI/AVH and pain.No behavioral issues noted.  A: Support and encouragement offered as needed. Medications administered as prescribed.  R: Patient safe and cooperative on the unit. Will continue to monitor patient for safety and stability.

## 2015-11-23 NOTE — BHH Suicide Risk Assessment (Signed)
[facility] Admission Suicide Risk Assessment   Nursing information obtained from:  Patient Demographic factors:  Male, Low socioeconomic status, Unemployed Current Mental Status:  Self-harm thoughts Loss Factors:  Financial problems / change in socioeconomic status Historical Factors:    Risk Reduction Factors:  Sense of responsibility to family  Total Time spent with patient: 1.5 hours Principal Problem: <principal problem not specified> Diagnosis:   Patient Active Problem List   Diagnosis Date Noted  . Bipolar affective disorder, depressed, severe ([place]) [F31.4] 11/22/2015  . Alcohol use disorder, severe, dependence ([place]) [F10.20] 01/18/2015  . Alcohol dependence with alcohol-induced mood disorder ([place]) [F10.24]   . Suicidal ideation [R45.851]   . Acute pancreatitis [K85.90] 03/20/2012  . Polysubstance abuse [F19.10] 03/20/2012  . Cocaine abuse [F14.10] 01/12/2012  . Cannabis abuse [F12.10] 01/12/2012  . Alcohol dependence ([place]) [F10.20] 10/15/2011  . Abnormal ECG [R94.31]   . Chest pain [R07.2]   . CAD in native artery [I25.10]   . GERD (gastroesophageal reflux disease) [K21.9]   . Tobacco abuse [Z72.0]   . History of cocaine abuse [F14.10]   . History of ETOH abuse [F10.10]   . Bradycardia [R00.1]   . Syncope [R55]    Subjective Data: somewhat tired . Depressed. Remains down and sad. Detox started.   Continued Clinical Symptoms:  Alcohol Use Disorder Identification Test Final Score (AUDIT): 17 The "Alcohol Use Disorders Identification Test", Guidelines for Use in Primary Care, Second Edition.  World Pharmac[facility]). Score between 0-7:  no or low risk or alcohol related problems. Score between 8-15:  moderate risk of alcohol related problems. Score between 16-19:  high risk of alcohol related problems. Score 20 or above:  warrants further diagnostic evaluation for alcohol dependence and treatment.   CLINICAL FACTORS:   Depression:    Hopelessness Impulsivity Alcohol/Substance Abuse/Dependencies More than one psychiatric diagnosis Unstable or Poor Therapeutic Relationship Previous Psychiatric Diagnoses and Treatments   Musculoskeletal: Strength & Muscle Tone: within normal limits Gait & Station: normal Patient leans: no lean  Psychiatric Specialty Exam: Physical Exam  Constitutional: He appears well-developed.  HENT:  Head: Normocephalic.    Review of Systems  Cardiovascular: Negative for chest pain.  Skin: Negative for rash.  Psychiatric/Behavioral: Positive for depression and substance abuse. The patient is nervous/anxious.     Blood pressure 110/68, pulse 58, temperature 98.5 F (36.9 C), temperature source Oral, resp. rate 16, SpO2 100 %.There is no weight on file to calculate BMI.  General Appearance: Casual  Eye Contact:  Fair  Speech:  Normal Rate  Volume:  Decreased  Mood:  Depressed and Dysphoric  Affect:  Congruent and Constricted  Thought Process:  Goal Directed  Orientation:  Full (Time, Place, and Person)  Thought Content:  Rumination  Suicidal Thoughts:  No  Homicidal Thoughts:  No  Memory:  Immediate;   Fair Recent;   Fair  Judgement:  Poor  Insight:  Shallow  Psychomotor Activity:  Normal  Concentration:  Concentration: Fair and Attention Span: Fair  Recall:  [employer] of Knowledge:  Fair  Language:  Fair  Akathisia:  Negative  Handed:  Right  AIMS (if indicated):     Assets:  Desire for Improvement  ADL's:  Intact  Cognition:  WNL  Sleep:  Number of Hours: 5.25      COGNITIVE FEATURES THAT CONTRIBUTE TO RISK:  Closed-mindedness    SUICIDE RISK:   Moderate:  Frequent suicidal ideation with limited intensity, and duration, some specificity in terms of plans,  no associated intent, good self-control, limited dysphoria/symptomatology, some risk factors present, and identifiable protective factors, including available and accessible social support.  PLAN OF CARE: admit for  stabilization and management of depression. Safety and start detox for alcohol dependence.  I certify that inpatient services furnished can reasonably be expected to improve the patient's condition.   Merian Capron, MD 11/23/2015, 10:19 AM

## 2015-11-23 NOTE — BHH Group Notes (Signed)
[place] LCSW Group Therapy  11/23/2015 10:10 until 11 AM  Type of Therapy:  Group Therapy  Participation Level:  Did Not Attend Despite MHT Prompting and Speaker Announcement of Group Time and Place.    Sheilah Pigeon, LCSW  11/23/2015

## 2015-11-23 NOTE — ED Provider Notes (Signed)
CSN: [id number]     Arrival date & time 11/21/15  2321 History   First MD Initiated Contact with Patient 11/22/15 0146     Chief Complaint  Patient presents with  . Medical Clearance     (Consider location/radiation/quality/duration/timing/severity/associated sxs/prior Treatment) HPI Comments: Patient with history of CAD, GERD, HTN, polysubstance abuse, gastric ulcer presents to the ED with complaint of depression and SI. He feels under significant stress. He is tearful. He denies any self harm prior to arrival. No HI.  The history is provided by the patient. No language interpreter was used.    Past Medical History  Diagnosis Date  . Chest pain, mid sternal   . CAD in native artery     a. Nonobstructive cath 11/2007;  b. Presented with ST elevation - Nonobstructive cath 08/2011  . GERD (gastroesophageal reflux disease)   . Tobacco abuse   . Marijuana abuse     a. uses ~ 1x /wk or less  . History of cocaine abuse     a. quit ? 2009  . History of ETOH abuse     a. drinks 2 "40's" / wk  . Bradycardia     a. asymptomatic  . Bipolar disorder ([place])   . Syncope     a. 12/2010 - presumed to be vasovagal  . Abnormal ECG     a. early repolarization  . Coronary artery disease   . Pneumonia   . Stomach ulcer   . Arthritis     "my whole left side" (05/03/2014)   Past Surgical History  Procedure Laterality Date  . Cardiac catheterization  2009; 08/2011    [name] 08/06/2011  . Left heart catheterization with coronary angiogram N/A 08/16/2011    Procedure: LEFT HEART CATHETERIZATION WITH CORONARY ANGIOGRAM;  Surgeon: [name], MD;  Location: [facility] CATH LAB;  Service: Cardiovascular;  Laterality: N/A;   History reviewed. No pertinent family history. Social History  Substance Use Topics  . Smoking status: Former Smoker -- 0.50 packs/day for 20 years    Types: Cigarettes    Quit date: 06/24/2015  . Smokeless tobacco: Never Used  . Alcohol Use: 28.8 oz/week    48 Cans of beer  per week     Comment: heavy drinker "until Jan 17"    Review of Systems  Constitutional: Negative for fever and chills.  Respiratory: Negative.   Cardiovascular: Negative.   Gastrointestinal: Negative.   Musculoskeletal: Negative.   Skin: Negative.   Neurological: Negative.   Psychiatric/Behavioral: Positive for suicidal ideas and dysphoric mood.      Allergies  Aspirin; Pepperoni; Tomato; and Tylenol  Home Medications   Prior to Admission medications   Medication Sig Start Date End Date Taking? Authorizing Provider  pantoprazole (PROTONIX) 20 MG tablet Take 1 tablet (20 mg total) by mouth daily. Patient not taking: Reported on 11/22/2015 09/21/15   Delsa Grana, PA-C  sucralfate (CARAFATE) 1 g tablet Take 1 tablet (1 g total) by mouth 4 (four) times daily -  with meals and at bedtime. Patient not taking: Reported on 11/22/2015 09/21/15   Delsa Grana, PA-C   BP 119/73 mmHg  Pulse 74  Temp(Src) 97.9 F (36.6 C) (Oral)  Resp 16  SpO2 99% Physical Exam  Constitutional: He appears well-developed and well-nourished.  HENT:  Head: Normocephalic.  Neck: Normal range of motion. Neck supple.  Cardiovascular: Normal rate and regular rhythm.   Pulmonary/Chest: Effort normal and breath sounds normal.  Abdominal: Soft. Bowel sounds are normal. There  is no tenderness. There is no rebound and no guarding.  Musculoskeletal: Normal range of motion.  Neurological: He is alert. No cranial nerve deficit.  Skin: Skin is warm and dry. No rash noted.  Psychiatric: His speech is normal. He exhibits a depressed mood. He expresses suicidal ideation.    ED Course  Procedures (including critical care time) Labs Review Labs Reviewed  ACETAMINOPHEN LEVEL - Abnormal; Notable for the following:    Acetaminophen (Tylenol), Serum <10 (*)    All other components within normal limits  COMPREHENSIVE METABOLIC PANEL - Abnormal; Notable for the following:    Total Protein 8.3 (*)    Albumin 5.1 (*)     All other components within normal limits  ETHANOL - Abnormal; Notable for the following:    Alcohol, Ethyl (B) 129 (*)    All other components within normal limits  URINE RAPID DRUG SCREEN, HOSP PERFORMED - Abnormal; Notable for the following:    Cocaine POSITIVE (*)    Tetrahydrocannabinol POSITIVE (*)    All other components within normal limits  CBC WITH DIFFERENTIAL/PLATELET  SALICYLATE LEVEL    Imaging Review No results found. I have personally reviewed and evaluated these images and lab results as part of my medical decision-making.   EKG Interpretation None      MDM   Final diagnoses:  Bipolar affective disorder, depressed, severe (HCC)  Alcohol use disorder, severe, dependence ([place])    Patient will require TTS consultation to determine optimal disposition.  TTS reports patient accepted at [facility] for further care.    Charlann Lange, PA-C 11/23/15 [place], MD 11/24/15 [phone]

## 2015-11-23 NOTE — BHH Suicide Risk Assessment (Signed)
[place] INPATIENT:  Family/Significant Other Suicide Prevention Education  Suicide Prevention Education:  Patient Refusal for Family/Significant Other Suicide Prevention Education: The patient [name] [name] has refused to provide written consent for family/significant other to be provided Family/Significant Other Suicide Prevention Education during admission and/or prior to discharge.  Physician notified. Writer provided suicide prevention education directly to patient; conversation included risk factors, warning signs and resources to contact for help. Mobile crisis services explained and  explanations given as to resources which will be included on patient's discharge paperwork.  Sheilah Pigeon, LCSW  11/23/2015

## 2015-11-23 NOTE — Progress Notes (Signed)
Patient did attend the evening speaker AA meeting.  

## 2015-11-23 NOTE — Progress Notes (Signed)
D: C/O anxiety and decreased energy this evening.  Denies physical complaints.  Originally didn't want to attend AA group due to decreased energy.  Affect appropriate animated, mood "anxious." Denies SI, HI, and AVH. A: 1700 scheduled Ativan, gabapentin was given late.  Nurse recommended attending group though patient wasn't feeling like it. R: Patient reported decrease in anxiety after medicine.  Patient did attend AA group this night.  Remains safe on unit. Appeared to be asleep on 15 minute checks by 1145.

## 2015-11-24 MED ORDER — QUETIAPINE FUMARATE 50 MG PO TABS
150.0000 mg | ORAL_TABLET | Freq: Every day | ORAL | Status: DC
  Administered 2015-11-24 – 2015-11-25 (×2): 150 mg via ORAL
  Filled 2015-11-24 (×5): qty 1

## 2015-11-24 NOTE — Progress Notes (Signed)
Recreation Therapy Notes  Date: 06.19.2017 Time: 9:30am Location: 300 Hall Group Room   Group Topic: Stress Management  Goal Area(s) Addresses:  Patient will actively participate in stress management techniques presented during session.   Behavioral Response: Did not attend.   Laureen Ochs Jeymi Hepp, LRT/CTRS        Annaston Upham L 11/24/2015 10:22 AM

## 2015-11-24 NOTE — Tx Team (Addendum)
Interdisciplinary Treatment Plan Update (Adult) Date: 11/24/2015   Time Reviewed: 9:30 AM  Progress in Treatment: Attending groups: No Participating in groups: No Taking medication as prescribed: Yes Tolerating medication: Yes Family/Significant other contact made: No, patient has declined collateral contact Patient understands diagnosis: Yes Discussing patient identified problems/goals with staff: Yes Medical problems stabilized or resolved: Yes Denies suicidal/homicidal ideation: Yes Issues/concerns per patient self-inventory: Yes Other:  New problem(s) identified: N/A  Discharge Plan or Barriers: Patient plans to discharge to parents home and follow up with outpatient services    Reason for Continuation of Hospitalization:  Depression Anxiety Medication Stabilization   Comments: N/A  Estimated length of stay: 1-2 days   Patient is a 50 yo disabled male hospitalized with suicidal ideation who reports stressors include non comp[liance with psych meds which he has been off of since last discharge in August of 2016 and substance abuse, in addition to being sole caregiver for significant other. Patient will benefit from crisis stabilization, medication evaluation, group therapy and psycho education, in addition to case management for discharge planning. At discharge it is recommended that patient adhere to the established discharge plan and continue in treatment.    Review of initial/current patient goals per problem list:  1. Goal(s): Patient will participate in aftercare plan   Met: Yes   Target date: 3-5 days post admission date   As evidenced by: Patient will participate within aftercare plan AEB aftercare provider and housing plan at discharge being identified.  6/21: Goal met: Patient plans to discharge to parents home to follow up with outpatient services.     2. Goal (s): Patient will exhibit decreased depressive symptoms and suicidal ideations.   Met:  Yes   Target date: 3-5 days post admission date   As evidenced by: Patient will utilize self rating of depression at 3 or below and demonstrate decreased signs of depression or be deemed stable for discharge by MD.  6/19: Goal progressing. Patient continues to isolate in his room.  6/21: Goal met. Patient rates depression at 0, denies SI.     3. Goal(s): Patient will demonstrate decreased signs and symptoms of anxiety.   Met: Yes   Target date: 3-5 days post admission date   As evidenced by: Patient will utilize self rating of anxiety at 3 or below and demonstrated decreased signs of anxiety, or be deemed stable for discharge by MD  6/19: Goal progressing. Patient continues to isolate in his room.   6/21: Goal met. Patient rates anxiety at 0.    4. Goal(s): Patient will demonstrate decreased signs of withdrawal due to substance abuse   Met: Adequate for discharge per MD.    Target date: 3-5 days post admission date   As evidenced by: Patient will produce a CIWA/COWS score of 0, have stable vitals signs, and no symptoms of withdrawal  6/19: CIWA of 1 with anxiety.   6/21: Adequate for discharge per MD. Patient with CIWA of 2, with tremor and anxiety. Patient reports feeling safe for discharge.    Attendees:  Patient:    Family:    Physician: Dr. Shea Evans, Dr. Einar Grad, MD  11/24/2015   Nursing: Gaylan Gerold, RN 11/24/2015   Clinical Social Worker: Erasmo Downer Juanette Urizar LCSW, Alvina Chou Smart LCSW 11/24/2015   Other:    Clinical: May Malachi Carl, NP 11/24/2015   Other:  Scribe for Treatment Team:  Tilden Fossa, [place]

## 2015-11-24 NOTE — Progress Notes (Signed)
[facility] MD Progress Note  11/24/2015 12:13 PM [name] [name]  MRN:  [id number] Subjective:  Patient reports " I am feeling depressed and dealing with my nephew being locked-up without a bond."   Objective: [name] [name] is awake, alert and oriented X4. Seen resting in bedroom. Denies suicidal or homicidal ideation. Denies auditory or visual hallucination and does not appear to be responding to internal stimuli. Patient reports he just found out that his nephew was charged in a local shooting and this has caused  A lot of stress for him and his family. Patient reports he is not attending group session due to his anxiety and depression. Patient reports he is medication compliant without mediation side effects. Patient reports he has been attending group session because he needs rest. States increasing anxiety while resting. States his  depression 8/10.  Reports good appetite and resting well. Support, encouragement and reassurance was provided.    Principal Problem: Bipolar affective disorder, depressed, severe ([place]) Diagnosis:   Patient Active Problem List   Diagnosis Date Noted  . Alcohol dependence with uncomplicated withdrawal ([place]) [F10.230]   . Bipolar affective disorder, depressed, severe ([place]) [F31.4] 11/22/2015  . Alcohol use disorder, severe, dependence ([place]) [F10.20] 01/18/2015  . Alcohol dependence with alcohol-induced mood disorder ([place]) [F10.24]   . Suicidal ideation [R45.851]   . Acute pancreatitis [K85.90] 03/20/2012  . Polysubstance abuse [F19.10] 03/20/2012  . Cocaine abuse [F14.10] 01/12/2012  . Cannabis abuse [F12.10] 01/12/2012  . Alcohol dependence ([place]) [F10.20] 10/15/2011  . Abnormal ECG [R94.31]   . Chest pain [R07.2]   . CAD in native artery [I25.10]   . GERD (gastroesophageal reflux disease) [K21.9]   . Tobacco abuse [Z72.0]   . History of cocaine abuse [F14.10]   . History of ETOH abuse [F10.10]   . Bradycardia [R00.1]   . Syncope [R55]    Total Time spent with  patient: 30 minutes  Past Psychiatric History: See Above  Past Medical History:  Past Medical History  Diagnosis Date  . Chest pain, mid sternal   . CAD in native artery     a. Nonobstructive cath 11/2007;  b. Presented with ST elevation - Nonobstructive cath 08/2011  . GERD (gastroesophageal reflux disease)   . Tobacco abuse   . Marijuana abuse     a. uses ~ 1x /wk or less  . History of cocaine abuse     a. quit ? 2009  . History of ETOH abuse     a. drinks 2 "40's" / wk  . Bradycardia     a. asymptomatic  . Bipolar disorder ([place])   . Syncope     a. 12/2010 - presumed to be vasovagal  . Abnormal ECG     a. early repolarization  . Coronary artery disease   . Pneumonia   . Stomach ulcer   . Arthritis     "my whole left side" (05/03/2014)    Past Surgical History  Procedure Laterality Date  . Cardiac catheterization  2009; 08/2011    [name] 08/06/2011  . Left heart catheterization with coronary angiogram N/A 08/16/2011    Procedure: LEFT HEART CATHETERIZATION WITH CORONARY ANGIOGRAM;  Surgeon: [name], MD;  Location: [facility] CATH LAB;  Service: Cardiovascular;  Laterality: N/A;   Family History: History reviewed. No pertinent family history. Family Psychiatric  History: See Above Social History:  History  Alcohol Use  . 28.8 oz/week  . 48 Cans of beer per week    Comment: heavy  drinker "until Jan 17"     History  Drug Use  . Yes  . Special: Marijuana, Cocaine    Comment: last use Jun 24, 2015    Social History   Social History  . Marital Status: Single    Spouse Name: N/A  . Number of Children: N/A  . Years of Education: N/A   Social History Main Topics  . Smoking status: Former Smoker -- 0.50 packs/day for 20 years    Types: Cigarettes    Quit date: 06/24/2015  . Smokeless tobacco: Never Used  . Alcohol Use: 28.8 oz/week    48 Cans of beer per week     Comment: heavy drinker "until Jan 17"  . Drug Use: Yes    Special: Marijuana, Cocaine      Comment: last use Jun 24, 2015  . Sexual Activity: Yes   Other Topics Concern  . None   Social History Narrative   Lives with his daughter currently and receives SSI.    Additional Social History:                         Sleep: Fair  Appetite:  Fair  Current Medications: Current Facility-Administered Medications  Medication Dose Route Frequency Provider Last Rate Last Dose  . alum & mag hydroxide-simeth (MAALOX/MYLANTA) 200-200-20 MG/5ML suspension 30 mL  30 mL Oral Q4H PRN [name], NP      . gabapentin (NEURONTIN) capsule 100 mg  100 mg Oral TID Derrill Center, NP   100 mg at 11/24/15 1159  . hydrOXYzine (ATARAX/VISTARIL) tablet 25 mg  25 mg Oral Q6H PRN [name], NP      . loperamide (IMODIUM) capsule 2-4 mg  2-4 mg Oral PRN [name], NP      . LORazepam (ATIVAN) tablet 1 mg  1 mg Oral Q6H PRN [name], NP      . LORazepam (ATIVAN) tablet 1 mg  1 mg Oral TID [name], NP   1 mg at 11/24/15 0840   Followed by  . [START ON 11/25/2015] LORazepam (ATIVAN) tablet 1 mg  1 mg Oral BID [name], NP       Followed by  . [START ON 11/26/2015] LORazepam (ATIVAN) tablet 1 mg  1 mg Oral Daily [name], NP      . magnesium hydroxide (MILK OF MAGNESIA) suspension 30 mL  30 mL Oral Daily PRN [name], NP      . multivitamin with minerals tablet 1 tablet  1 tablet Oral Daily [name], NP   1 tablet at 11/24/15 0840  . ondansetron (ZOFRAN-ODT) disintegrating tablet 4 mg  4 mg Oral Q6H PRN [name], NP      . QUEtiapine (SEROQUEL) tablet 100 mg  100 mg Oral QHS Derrill Center, NP   100 mg at 11/23/15 2114  . thiamine (VITAMIN B-1) tablet 100 mg  100 mg Oral Daily [name], NP   100 mg at 11/24/15 0840  . traZODone (DESYREL) tablet 50 mg  50 mg Oral QHS,MR X 1 Nanci Pina, FNP   50 mg at 11/23/15 2114    Lab Results: No results found for this or any previous visit (from the past 48 hour(s)).  Blood Alcohol level:   Lab Results  Component Value Date   [facility] 129* 11/22/2015   ETH 193* 01/18/2015    Physical Findings: AIMS:  , ,  ,  ,  CIWA:  CIWA-Ar Total: 0 COWS:     Musculoskeletal: Strength & Muscle Tone: within normal limits Gait & Station: normal Patient leans: Right  Psychiatric Specialty Exam: Physical Exam  Nursing note and vitals reviewed. Constitutional: He is oriented to person, place, and time. He appears well-developed.  HENT:  Head: Normocephalic.  Neck: Normal range of motion.  Musculoskeletal: Normal range of motion.  Neurological: He is alert and oriented to person, place, and time.  Psychiatric: He has a normal mood and affect. His behavior is normal.    Review of Systems  Psychiatric/Behavioral: Positive for depression, hallucinations and substance abuse. Negative for suicidal ideas. The patient is not nervous/anxious.     Blood pressure 81/45, pulse 53, temperature 98.9 F (37.2 C), temperature source Oral, resp. rate 20, SpO2 100 %.There is no weight on file to calculate BMI.  General Appearance: Disheveled  Eye Contact:  Fair  Speech:  Clear and Coherent  Volume:  Normal  Mood:  Anxious and Depressed  Affect:  Congruent  Thought Process:  Coherent  Orientation:  Full (Time, Place, and Person)  Thought Content:  Logical and Hallucinations: None  Suicidal Thoughts:  No  Homicidal Thoughts:  No  Memory:  Immediate;   Fair Recent;   Fair Remote;   Fair  Judgement:  Fair  Insight:  Fair  Psychomotor Activity:  Restlessness  Concentration:  Concentration: Fair  Recall:  [employer] of Knowledge:  Fair  Language:  Good  Akathisia:  No  Handed:  Right  AIMS (if indicated):     Assets:  Communication Skills Desire for Improvement Housing Social Support  ADL's:  Intact  Cognition:  WNL  Sleep:  Number of Hours: 4.75     I agree with current treatment plan on 11/24/2015, Patient seen face-to-face for psychiatric evaluation follow-up, chart reviewed.  Reviewed the information documented and agree with the treatment plan.  Treatment Plan Summary: Daily contact with patient to assess and evaluate symptoms and progress in treatment and Medication management Increased with Seroquel 100 mg to 150 mg PO QHS and Continue  Neurontin 100 mg PO TID for mood stabilization. Discontinued with Trazodone 100 mg for insomnia Contiune on CWIA/ Ativan Protocol Will continue to monitor vitals ,medication compliance and treatment side effects while patient is here.  Reviewed labs: BAL - 129, UDS - positive for cocaine CSW will start working on disposition.  Patient to participate in therapeutic milieu  Derrill Center, NP 11/24/2015, 12:13 PM

## 2015-11-24 NOTE — BHH Group Notes (Signed)
[place] LCSW Group Therapy 11/24/2015  1:15 PM   Type of Therapy: Group Therapy  Participation Level: Did Not Attend. Patient invited to participate but declined.   Tilden Fossa, MSW, [place] Clinical Social Worker [facility] [phone]

## 2015-11-24 NOTE — Progress Notes (Signed)
Patient ID: [name] [name], male   DOB: 07/17/1965, 50 y.o.   MRN: [id number]  DAR: Pt. Denies SI/HI and A/V Hallucinations. He reports feeling "woozy" today. He is seen mainly sleeping in bed throughout the day except for meals and medications. Patient does not report any pain at this time. Support and encouragement provided to the patient. Patient received scheduled medications except scheduled Ativan due to feeling lightheaded and dizzy. His Ativan was held and MD Eappen was notified of this. Patient's vital signs continue to be monitored. Patient's BP and pulse appear to be decreased today. Patient received a wheelchair in order to ambulate safely. He also was given PO fluids and was encouraged to drink plenty to help with BP. Patient verbalized understanding. Patient is minimal but cooperative. Q15 minute checks are maintained for safety.

## 2015-11-24 NOTE — Progress Notes (Signed)
D: Denies complaints, states feeling "woozy" has resolved.  "I think it was that medicine, the Ativan." Ativan has been held all day. BP this evening has improved, 108/64 sitting.  Appropriate and pleasant with staff and peers. Did not attend AA group, spends most of time in room.  Affect appropriate, mood "content."  Denies SI, HI, and AVH.  Patient was given one dose of Ciprofloxacin 500mg  around 2100. A: Fluids encouraged. PA notified of Ciprofloxacin, patient has no known allergy to this medicine.  Vitals stable.  Remains on 15 minute checks. R: Observed asleep on 2300 check.  Remains safe on unit. No adverse effects observed from Cipro.

## 2015-11-25 NOTE — Plan of Care (Signed)
Problem: Medication: Goal: Compliance with prescribed medication regimen will improve Outcome: Progressing Pt compliant with taking meds.    

## 2015-11-25 NOTE — Progress Notes (Signed)
Recreation Therapy Notes  Animal-Assisted Activity (AAA) Program Checklist/Progress Notes Patient Eligibility Criteria Checklist & Daily Group note for Rec Tx Intervention  Date: 06.20.2017 Time: 2:45pm Location: [address]    AAA/T Program Assumption of Risk Form signed by Patient/ or Parent Legal Guardian Yes  Patient is free of allergies or sever asthma Yes  Patient reports no fear of animals Yes  Patient reports no history of cruelty to animals Yes  Patient understands his/her participation is voluntary Yes  Behavioral Response: Did not attend.   Laureen Ochs Raekwan Spelman, LRT/CTRS        Jazarah Capili L 11/25/2015 2:57 PM

## 2015-11-25 NOTE — Progress Notes (Signed)
D: Pt presents with flat affect and depressed mood. Pt appears disheveled with poor hygiene. Pt had poor eye contact, minimal interaction and forwarded little information during shift assessment. Pt rates decreasing depression 3/10. Pt denies suicidal thoughts. Pt reported poor sleep last night. Pt denied withdrawal symptoms this morning. Pt compliant with taking meds. No side effects to meds verbalized by pt this morning. A: Medications reviewed with pt. Medications administered as ordered per MD. Verbal support provided. Pt encouraged to attend groups. 15 minute checks performed for safety.  R: Pt receptive to tx.

## 2015-11-25 NOTE — Progress Notes (Signed)
CSW met with patient to discuss his progress and discharge plans. Patient reports improvement in his depression. He believes that his medication is helpful. 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 He reports that she is able to be mobile as she uses a wheel chair and that she has food to eat. He denies the need for a wellness check. Patient reports that his parents that live in [place] are supportive but that he is unable to live there at discharge. He is unsure if he wants to return to previous living situation at discharge due to stress and fiance's "mood swings". He expresses interest in finding his own housing. He is agreeable to follow up with [facility] for outpatient services. CSW discussed [facility] TCT services, patient is considering. Patient reports that he is still considering his options at discharge and has no further questions at this time. CSW will continue to follow for support and disposition planning.   Tilden Fossa, LCSW Clinical Social Worker [facility] [phone]

## 2015-11-25 NOTE — BHH Group Notes (Signed)
[facility] LCSW Group Therapy 11/25/2015  1:15 PM   Type of Therapy: Group Therapy  Participation Level: Did Not Attend. Patient invited to participate but declined.   Tilden Fossa, MSW, [place] Clinical Social Worker [facility] [phone]

## 2015-11-25 NOTE — Progress Notes (Signed)
Adult Psychoeducational Group Note  Date:  11/25/2015 Time:  0900   Group Topic/Focus:  Orientation:   The focus of this group is to educate the patient on the purpose and policies of crisis stabilization and provide a format to answer questions about their admission.  The group details unit policies and expectations of patients while admitted.  Participation Level:  Did Not Attend  Participation Quality:    Affect:    Cognitive:    Insight:   Engagement in Group:    Modes of Intervention:    Additional Comments:   Delitha Elms L 11/25/2015, 9:43 AM

## 2015-11-25 NOTE — Progress Notes (Signed)
[facility] MD Progress Note  11/25/2015 2:59 PM [name] [name]  MRN:  [id number] Subjective:  Patient reports " I still feel down.  I'm sleeping better."   Objective: [name] [name] is awake, alert and oriented X4. Seen resting in bedroom. Denies suicidal or homicidal ideation. Denies auditory or visual hallucination and does not appear to be responding to internal stimuli.  Per nursing, patient does attend groups and participates.    Principal Problem: Bipolar affective disorder, depressed, severe ([place]) Diagnosis:   Patient Active Problem List   Diagnosis Date Noted  . Alcohol dependence with uncomplicated withdrawal ([place]) [F10.230]   . Bipolar affective disorder, depressed, severe ([place]) [F31.4] 11/22/2015  . Alcohol use disorder, severe, dependence ([place]) [F10.20] 01/18/2015  . Alcohol dependence with alcohol-induced mood disorder ([place]) [F10.24]   . Suicidal ideation [R45.851]   . Acute pancreatitis [K85.90] 03/20/2012  . Polysubstance abuse [F19.10] 03/20/2012  . Cocaine abuse [F14.10] 01/12/2012  . Cannabis abuse [F12.10] 01/12/2012  . Alcohol dependence ([place]) [F10.20] 10/15/2011  . Abnormal ECG [R94.31]   . Chest pain [R07.2]   . CAD in native artery [I25.10]   . GERD (gastroesophageal reflux disease) [K21.9]   . Tobacco abuse [Z72.0]   . History of cocaine abuse [F14.10]   . History of ETOH abuse [F10.10]   . Bradycardia [R00.1]   . Syncope [R55]    Total Time spent with patient: 30 minutes  Past Psychiatric History: See Above  Past Medical History:  Past Medical History  Diagnosis Date  . Chest pain, mid sternal   . CAD in native artery     a. Nonobstructive cath 11/2007;  b. Presented with ST elevation - Nonobstructive cath 08/2011  . GERD (gastroesophageal reflux disease)   . Tobacco abuse   . Marijuana abuse     a. uses ~ 1x /wk or less  . History of cocaine abuse     a. quit ? 2009  . History of ETOH abuse     a. drinks 2 "40's" / wk  . Bradycardia     a.  asymptomatic  . Bipolar disorder ([place])   . Syncope     a. 12/2010 - presumed to be vasovagal  . Abnormal ECG     a. early repolarization  . Coronary artery disease   . Pneumonia   . Stomach ulcer   . Arthritis     "my whole left side" (05/03/2014)    Past Surgical History  Procedure Laterality Date  . Cardiac catheterization  2009; 08/2011    [name] 08/06/2011  . Left heart catheterization with coronary angiogram N/A 08/16/2011    Procedure: LEFT HEART CATHETERIZATION WITH CORONARY ANGIOGRAM;  Surgeon: [name], MD;  Location: [facility] CATH LAB;  Service: Cardiovascular;  Laterality: N/A;   Family History: History reviewed. No pertinent family history. Family Psychiatric  History: See Above Social History:  History  Alcohol Use  . 28.8 oz/week  . 48 Cans of beer per week    Comment: heavy drinker "until Jan 17"     History  Drug Use  . Yes  . Special: Marijuana, Cocaine    Comment: last use Jun 24, 2015    Social History   Social History  . Marital Status: Single    Spouse Name: N/A  . Number of Children: N/A  . Years of Education: N/A   Social History Main Topics  . Smoking status: Former Smoker -- 0.50 packs/day for 20 years    Types: Cigarettes  Quit date: 06/24/2015  . Smokeless tobacco: Never Used  . Alcohol Use: 28.8 oz/week    48 Cans of beer per week     Comment: heavy drinker "until Jan 17"  . Drug Use: Yes    Special: Marijuana, Cocaine     Comment: last use Jun 24, 2015  . Sexual Activity: Yes   Other Topics Concern  . None   Social History Narrative   Lives with his daughter currently and receives SSI.    Additional Social History:                         Sleep: Fair  Appetite:  Fair  Current Medications: Current Facility-Administered Medications  Medication Dose Route Frequency Provider Last Rate Last Dose  . alum & mag hydroxide-simeth (MAALOX/MYLANTA) 200-200-20 MG/5ML suspension 30 mL  30 mL Oral Q4H PRN [name], NP      . gabapentin (NEURONTIN) capsule 100 mg  100 mg Oral TID Derrill Center, NP   100 mg at 11/25/15 1213  . magnesium hydroxide (MILK OF MAGNESIA) suspension 30 mL  30 mL Oral Daily PRN [name], NP      . multivitamin with minerals tablet 1 tablet  1 tablet Oral Daily [name], NP   1 tablet at 11/25/15 [phone]  . QUEtiapine (SEROQUEL) tablet 150 mg  150 mg Oral QHS Derrill Center, NP   150 mg at 11/24/15 2141  . thiamine (VITAMIN B-1) tablet 100 mg  100 mg Oral Daily [name], NP   100 mg at 11/25/15 [id number]    Lab Results: No results found for this or any previous visit (from the past 15 hour(s)).  Blood Alcohol level:  Lab Results  Component Value Date   ETH 129* 11/22/2015   ETH 193* 01/18/2015    Physical Findings: AIMS:  , ,  ,  ,    CIWA:  CIWA-Ar Total: 0 COWS:     Musculoskeletal: Strength & Muscle Tone: within normal limits Gait & Station: normal Patient leans: Right  Psychiatric Specialty Exam: Physical Exam  Nursing note and vitals reviewed. Constitutional: He is oriented to person, place, and time. He appears well-developed.  HENT:  Head: Normocephalic.  Neck: Normal range of motion.  Musculoskeletal: Normal range of motion.  Neurological: He is alert and oriented to person, place, and time.  Psychiatric: He has a normal mood and affect. His behavior is normal.    Review of Systems  Psychiatric/Behavioral: Positive for depression, hallucinations and substance abuse. Negative for suicidal ideas. The patient is not nervous/anxious.     Blood pressure 102/61, pulse 71, temperature 97.9 F (36.6 C), temperature source Oral, resp. rate 16, SpO2 100 %.There is no weight on file to calculate BMI.  General Appearance: Disheveled  Eye Contact:  Fair  Speech:  Clear and Coherent  Volume:  Normal  Mood:  Anxious and Depressed  Affect:  Congruent  Thought Process:  Coherent  Orientation:  Full (Time, Place, and Person)  Thought Content:   Logical and Hallucinations: None  Suicidal Thoughts:  No  Homicidal Thoughts:  No  Memory:  Immediate;   Fair Recent;   Fair Remote;   Fair  Judgement:  Fair  Insight:  Fair  Psychomotor Activity:  Restlessness  Concentration:  Concentration: Fair  Recall:  [employer] of Knowledge:  Fair  Language:  Good  Akathisia:  No  Handed:  Right  AIMS (  if indicated):     Assets:  Communication Skills Desire for Improvement Housing Social Support  ADL's:  Intact  Cognition:  WNL  Sleep:  Number of Hours: 6.25     I agree with current treatment plan on 11/24/2015, Patient seen face-to-face for psychiatric evaluation follow-up, chart reviewed. Reviewed the information documented and agree with the treatment plan.  Treatment Plan Summary: Daily contact with patient to assess and evaluate symptoms and progress in treatment and Medication management Increased wSeroquel 100 mg to 150 mg PO QHS and Continue  Neurontin 100 mg PO TID for mood stabilization. Discontinued with Trazodone 100 mg for insomnia Contiune on CWIA/ Ativan Protocol Will continue to monitor vitals ,medication compliance and treatment side effects while patient is here.  Reviewed labs: BAL - 129, UDS - positive for cocaine CSW will start working on disposition.  Patient to participate in therapeutic milieu  Janett Labella, NP 11/25/2015, 2:59 PM

## 2015-11-25 NOTE — Progress Notes (Signed)
Psychoeducational Group Note  Date:  11/25/2015 Time:2030  Group Topic/Focus:  wrap up group  Participation Level: Did Not Attend  Participation Quality:  Not Applicable  Affect:  Not Applicable  Cognitive:  Not Applicable  Insight:  Not Applicable  Engagement in Group: Not Applicable  Additional Comments:  Pt remained in bed during group time.   Shellia Cleverly 11/25/2015, 9:37 PM

## 2015-11-26 MED ORDER — GABAPENTIN 100 MG PO CAPS: 100.0000 mg | ORAL_CAPSULE | Freq: Two times a day (BID) | ORAL | Status: DC

## 2015-11-26 MED ORDER — QUETIAPINE FUMARATE 50 MG PO TABS: 100.0000 mg | ORAL_TABLET | Freq: Every day | ORAL | Status: DC

## 2015-11-26 NOTE — BHH Suicide Risk Assessment (Signed)
[facility] Discharge Suicide Risk Assessment   Principal Problem: Bipolar affective disorder, depressed, severe ([place]) Discharge Diagnoses:  Patient Active Problem List   Diagnosis Date Noted  . Alcohol dependence with uncomplicated withdrawal ([place]) [F10.230]   . Bipolar affective disorder, depressed, severe ([place]) [F31.4] 11/22/2015  . Alcohol use disorder, severe, dependence ([place]) [F10.20] 01/18/2015  . Alcohol dependence with alcohol-induced mood disorder ([employer]) [F10.24]   . Suicidal ideation [R45.851]   . Acute pancreatitis [K85.90] 03/20/2012  . Polysubstance abuse [F19.10] 03/20/2012  . Cocaine abuse [F14.10] 01/12/2012  . Cannabis abuse [F12.10] 01/12/2012  . Alcohol dependence ([place]) [F10.20] 10/15/2011  . Abnormal ECG [R94.31]   . Chest pain [R07.2]   . CAD in native artery [I25.10]   . GERD (gastroesophageal reflux disease) [K21.9]   . Tobacco abuse [Z72.0]   . History of cocaine abuse [F14.10]   . History of ETOH abuse [F10.10]   . Bradycardia [R00.1]   . Syncope [R55]     Total Time spent with patient: 15 minutes  Musculoskeletal: Strength & Muscle Tone: within normal limits Gait & Station: normal Patient leans: N/A  Psychiatric Specialty Exam: ROS  Blood pressure 107/61, pulse 50, temperature 98.2 F (36.8 C), temperature source Oral, resp. rate 16, SpO2 100 %.There is no weight on file to calculate BMI.  General Appearance: Casual  Eye Contact::  Fair  Speech:  Clear and [id number]  Volume:  Normal  Mood:  Euthymic  Affect:  Congruent  Thought Process:  Coherent  Orientation:  Full (Time, Place, and Person)  Thought Content:  WDL  Suicidal Thoughts:  No  Homicidal Thoughts:  No  Memory:  Immediate;   Fair Recent;   Fair Remote;   Good  Judgement:  Fair  Insight:  Fair  Psychomotor Activity:  Normal  Concentration:  Fair  Recall:  [employer] of Knowledge:Fair  Language: Fair  Akathisia:  No  Handed:  Right  AIMS (if indicated):     Assets:   Communication Skills Desire for Improvement  Sleep:  Number of Hours: 5.5  Cognition: WNL  ADL's:  Intact   Mental Status Per Nursing Assessment::   On Admission:  Self-harm thoughts  Demographic Factors:  Male and Low socioeconomic status  Loss Factors: Decrease in vocational status  Historical Factors: Impulsivity  Risk Reduction Factors:   Positive coping skills or problem solving skills  Continued Clinical Symptoms:  Improved mmood.  Cognitive Features That Contribute To Risk:  None    Suicide Risk:  Minimal: No identifiable suicidal ideation.  Patients presenting with no risk factors but with morbid ruminations; may be classified as minimal risk based on the severity of the depressive symptoms  Follow-up Information    Follow up with [facility].   Specialty:  Behavioral Health   Why:  Walk-in clinic Monday-Friday at 8am for assessment for therapy and medication management services.    Contact information:   [name] [address] [phone]       Plan Of Care/Follow-up recommendations:  Activity:  normal Diet:  normal   1. Take all your medications as prescribed.  2. Report any adverse side effects to outpatient provider. 3. Patient instructed to not use alcohol or illegal drugs while on prescription medicines. 4. In the event of worsening symptoms, instructed patient to call 911, the crisis hotline or go to nearest emergency room for evaluation of symptoms.   Elvin So, MD 11/26/2015, 1:27 PM

## 2015-11-26 NOTE — Discharge Summary (Signed)
Physician Discharge Summary Note  Patient:  [name] [name] is an 50 y.o., male MRN:  [id number] DOB:  25-Aug-1965 Patient phone:  [phone] (home)  Patient address:   [address] [name] [address],  Total Time spent with patient: 30 minutes  Date of Admission:  11/22/2015 Date of Discharge: 11/26/2015  Reason for Admission:  Depression  Principal Problem: Bipolar affective disorder, depressed, severe [facility]) Discharge Diagnoses: Patient Active Problem List   Diagnosis Date Noted  . Alcohol dependence with uncomplicated withdrawal ([place]) [F10.230]   . Bipolar affective disorder, depressed, severe ([place]) [F31.4] 11/22/2015  . Alcohol use disorder, severe, dependence ([name]) [F10.20] 01/18/2015  . Alcohol dependence with alcohol-induced mood disorder ([place]) [F10.24]   . Suicidal ideation [R45.851]   . Acute pancreatitis [K85.90] 03/20/2012  . Polysubstance abuse [F19.10] 03/20/2012  . Cocaine abuse [F14.10] 01/12/2012  . Cannabis abuse [F12.10] 01/12/2012  . Alcohol dependence ([place]) [F10.20] 10/15/2011  . Abnormal ECG [R94.31]   . Chest pain [R07.2]   . CAD in native artery [I25.10]   . GERD (gastroesophageal reflux disease) [K21.9]   . Tobacco abuse [Z72.0]   . History of cocaine abuse [F14.10]   . History of ETOH abuse [F10.10]   . Bradycardia [R00.1]   . Syncope [R55]     Past Psychiatric History:  See HPI  Past Medical History:  Past Medical History  Diagnosis Date  . Chest pain, mid sternal   . CAD in native artery     a. Nonobstructive cath 11/2007;  b. Presented with ST elevation - Nonobstructive cath 08/2011  . GERD (gastroesophageal reflux disease)   . Tobacco abuse   . Marijuana abuse     a. uses ~ 1x /wk or less  . History of cocaine abuse     a. quit ? 2009  . History of ETOH abuse     a. drinks 2 "40's" / wk  . Bradycardia     a. asymptomatic  . Bipolar disorder ([place])   . Syncope     a. 12/2010 - presumed to be vasovagal  . Abnormal ECG     a. early repolarization  . Coronary artery disease   . Pneumonia   . Stomach ulcer   . Arthritis     "my whole left side" (05/03/2014)    Past Surgical History  Procedure Laterality Date  . Cardiac catheterization  2009; 08/2011    [name] 08/06/2011  . Left heart catheterization with coronary angiogram N/A 08/16/2011    Procedure: LEFT HEART CATHETERIZATION WITH CORONARY ANGIOGRAM;  Surgeon: [name], MD;  Location: [facility] CATH LAB;  Service: Cardiovascular;  Laterality: N/A;   Family History: History reviewed. No pertinent family history. Family Psychiatric  History:  See HPI Social History:  History  Alcohol Use  . 28.8 oz/week  . 48 Cans of beer per week    Comment: heavy drinker "until Jan 17"     History  Drug Use  . Yes  . Special: Marijuana, Cocaine    Comment: last use Jun 24, 2015    Social History   Social History  . Marital Status: Single    Spouse Name: N/A  . Number of Children: N/A  . Years of Education: N/A   Social History Main Topics  . Smoking status: Former Smoker -- 0.50 packs/day for 20 years    Types: Cigarettes    Quit date: 06/24/2015  . Smokeless tobacco: Never Used  . Alcohol Use: 28.8 oz/week  48 Cans of beer per week     Comment: heavy drinker "until Jan 17"  . Drug Use: Yes    Special: Marijuana, Cocaine     Comment: last use Jun 24, 2015  . Sexual Activity: Yes   Other Topics Concern  . None   Social History Narrative   Lives with his daughter currently and receives SSI.     Hospital Course:   [name] [name] was admitted for Bipolar affective disorder, depressed, severe ([place]) and crisis management.  He was treated with meds and their indications listed below.  Medical problems were identified and treated as needed.  Home medications were restarted as appropriate.  This NP met with patient and he was in good spirits.  He states that he will will stay with his parents.  He plans to follow up with [facility].  Improvement was  monitored by observation and [name] [name] daily report of symptom reduction.  Emotional and mental status was monitored by daily self inventory reports completed by [name] [name] and clinical staff.  Patient reported continued improvement, denied any new concerns.  Patient had been compliant on medications and denied side effects.  Support and encouragement was provided.    At time of discharge, patient rated both depression and anxiety levels to be manageable and minimal.  Patient encouraged to attend groups to help with recognizing triggers of emotional crises and de-stabilizations.  Patient encouraged to attend group to help identify the positive things in life that would help in dealing with feelings of loss, depression and unhealthy or abusive tendencies.         [name] [name] was evaluated by the treatment team for stability and plans for continued recovery upon discharge.  He was offered further treatment options upon discharge including Residential, Intensive Outpatient and Outpatient treatment.  He will follow up with agencies listed below for medication management and counseling.  Encouraged patient to maintain satisfactory support network and home environment.  Advised to adhere to medication compliance and outpatient treatment follow up.  Prescriptions provided.       [name] [name] motivation was an integral factor for scheduling further treatment.  Employment, transportation, bed availability, health status, family support, and any pending legal issues were also considered during his hospital stay.  Upon completion of this admission the patient was both mentally and medically stable for discharge denying suicidal/homicidal ideation, auditory/visual/tactile hallucinations, delusional thoughts and paranoia.      To note:  Patient thought that Seroquel may be "too much as he gets too sleepy."  Discussed with Dr Einar Grad, patient's Seroquel decreased to 100 mg QHS and Gabapentin decreased in  frequency to BID dosing.    Physical Findings: AIMS: Facial and Oral Movements Muscles of Facial Expression: None, normal Lips and Perioral Area: None, normal Jaw: None, normal Tongue: None, normal,Extremity Movements Upper (arms, wrists, hands, fingers): None, normal Lower (legs, knees, ankles, toes): None, normal, Trunk Movements Neck, shoulders, hips: None, normal, Overall Severity Severity of abnormal movements (highest score from questions above): None, normal Incapacitation due to abnormal movements: None, normal Patient's awareness of abnormal movements (rate only patient's report): No Awareness, Dental Status Current problems with teeth and/or dentures?: No Does patient usually wear dentures?: No  CIWA:  CIWA-Ar Total: 2 COWS:     Musculoskeletal: Strength & Muscle Tone: within normal limits Gait & Station: normal Patient leans: N/A  Psychiatric Specialty Exam:  SEE MD SRA Physical Exam  Nursing note and vitals reviewed. Psychiatric: His  mood appears not anxious. He is not agitated. Thought content is not paranoid. He does not exhibit a depressed mood. He expresses no homicidal and no suicidal ideation.    Review of Systems  Psychiatric/Behavioral: Negative for depression and suicidal ideas. The patient is not nervous/anxious.   All other systems reviewed and are negative.   Blood pressure 107/61, pulse 50, temperature 98.2 F (36.8 C), temperature source Oral, resp. rate 16, SpO2 100 %.There is no weight on file to calculate BMI.   Have you used any form of tobacco in the last 30 days? (Cigarettes, Smokeless Tobacco, Cigars, and/or Pipes): Yes  Has this patient used any form of tobacco in the last 30 days? (Cigarettes, Smokeless Tobacco, Cigars, and/or Pipes) Yes, N/A  Blood Alcohol level:  Lab Results  Component Value Date   [facility] 129* 11/22/2015   ETH 193* 82/50/0370    Metabolic Disorder Labs:  Lab Results  Component Value Date   HGBA1C 4.5 05/03/2014   MPG  82 05/03/2014   MPG 100 08/16/2011   No results found for: PROLACTIN Lab Results  Component Value Date   CHOL 159 05/03/2014   TRIG 97 05/03/2014   HDL 98 05/03/2014   CHOLHDL 1.6 05/03/2014   VLDL 19 05/03/2014   LDLCALC 42 05/03/2014   LDLCALC 75 03/20/2012    See Psychiatric Specialty Exam and Suicide Risk Assessment completed by Attending Physician prior to discharge.  Discharge destination:  Home  Is patient on multiple antipsychotic therapies at discharge:  No   Has Patient had three or more failed trials of antipsychotic monotherapy by history:  No  Recommended Plan for Multiple Antipsychotic Therapies: NA     Medication List    STOP taking these medications        pantoprazole 20 MG tablet  Commonly known as:  PROTONIX     sucralfate 1 g tablet  Commonly known as:  CARAFATE      TAKE these medications      Indication   gabapentin 100 MG capsule  Commonly known as:  NEURONTIN  Take 1 capsule (100 mg total) by mouth 2 (two) times daily.   Indication:  Agitation     QUEtiapine 50 MG tablet  Commonly known as:  SEROQUEL  Take 2 tablets (100 mg total) by mouth at bedtime.   Indication:  mood stabilization        Follow-up recommendations:  Activity:  as tol Diet:  as tol  Comments:  1.  Take all your medications as prescribed.   2.  Report any adverse side effects to outpatient provider. 3.  Patient instructed to not use alcohol or illegal drugs while on prescription medicines. 4.  In the event of worsening symptoms, instructed patient to call 911, the crisis hotline or go to nearest emergency room for evaluation of symptoms.  Signed: Janett Labella, NP [facility] 11/26/2015, 10:42 AM

## 2015-11-26 NOTE — Progress Notes (Signed)
Recreation Therapy Notes  Date: 06.21.2017 Time: 9:30am Location: [address] Dayroom   Group Topic: Stress Management  Goal Area(s) Addresses:  Patient will actively participate in stress management techniques presented during session.   Behavioral Response: Did not attend.   Laureen Ochs Chozen [place], LRT/CTRS         Emillio Ngo L 11/26/2015 1:26 PM

## 2015-11-26 NOTE — Progress Notes (Signed)
  [facility] Adult Case Management Discharge Plan :  Will you be returning to the same living situation after discharge:  No. Patient plans to discharge to parents home At discharge, do you have transportation home?: Yes,  bus pass Do you have the ability to pay for your medications: Yes,  patient will be provided with prescriptions at discharge  Release of information consent forms completed and in the chart;  Patient's signature needed at discharge.  Patient to Follow up at: Follow-up Information    Follow up with [facility].   Specialty:  Behavioral Health   Why:  Walk-in clinic Monday-Friday at 8am for assessment for therapy and medication management services.    Contact information:   [place] [address] [phone]       Next level of care provider has access to [place] and Suicide Prevention discussed: Yes,  with patient  Have you used any form of tobacco in the last 30 days? (Cigarettes, Smokeless Tobacco, Cigars, and/or Pipes): Yes  Has patient been referred to the Quitline?: Patient refused referral  Patient has been referred for addiction treatment: Yes  [name] [name], Casimiro Needle 11/26/2015, 10:45 AM

## 2015-11-26 NOTE — Progress Notes (Signed)
Discharge note: Pt received both written and verbal discharge instructions. Pt verbalized understanding of discharge instructions. Pt agreed to f/u appt and med regimen. Pt received prescriptions, SRA, AVS,  transition record and belongings. Pt given bus pass and safely discharged to the lobby.

## 2015-11-26 NOTE — Progress Notes (Signed)
Pt was in his bed awake at the beginning of the shift.  He reports that he had a good day and voiced no needs or concerns this evening.  He denies SI/HI/AVH at this time.  He reported that he was going to groups and participating in his treatment.  He is not having any withdrawal symptoms this evening.  Support and encouragement offered.  Pt encouraged to get up so that he would be able to sleep tonight.  Pt was pleasant and cooperative with staff.  He makes his needs known to staff.  Discharge plans are in process.  Safety maintained with q15 minute checks.

## 2015-12-13 ENCOUNTER — Encounter (HOSPITAL_COMMUNITY): Payer: Self-pay | Admitting: *Deleted

## 2015-12-13 ENCOUNTER — Emergency Department (HOSPITAL_COMMUNITY)
Admission: EM | Admit: 2015-12-13 | Discharge: 2015-12-13 | Disposition: A | Discharge status: Home / Self Care | Payer: Medicaid Other | Attending: Emergency Medicine | Admitting: Emergency Medicine

## 2015-12-13 DIAGNOSIS — Z87891 Personal history of nicotine dependence: Secondary | ICD-10-CM | POA: Insufficient documentation

## 2015-12-13 DIAGNOSIS — R1013 Epigastric pain: Secondary | ICD-10-CM | POA: Insufficient documentation

## 2015-12-13 DIAGNOSIS — I2510 Atherosclerotic heart disease of native coronary artery without angina pectoris: Secondary | ICD-10-CM | POA: Insufficient documentation

## 2015-12-13 DIAGNOSIS — Z79899 Other long term (current) drug therapy: Secondary | ICD-10-CM | POA: Insufficient documentation

## 2015-12-13 LAB — COMPREHENSIVE METABOLIC PANEL
ALBUMIN: 3.7 g/dL (ref 3.5–5.0)
ALK PHOS: 53 U/L (ref 38–126)
ALT: 16 U/L — AB (ref 17–63)
ANION GAP: 6 (ref 5–15)
AST: 22 U/L (ref 15–41)
BUN: 6 mg/dL (ref 6–20)
CO2: 26 mmol/L (ref 22–32)
Calcium: 9.1 mg/dL (ref 8.9–10.3)
Chloride: 105 mmol/L (ref 101–111)
Creatinine, Ser: 1.12 mg/dL (ref 0.61–1.24)
GFR calc non Af Amer: >60 mL/min (ref >60)
GLUCOSE: 102 mg/dL — AB (ref 65–99)
POTASSIUM: 3.9 mmol/L (ref 3.5–5.1)
SODIUM: 137 mmol/L (ref 135–145)
TOTAL PROTEIN: 6.4 g/dL — AB (ref 6.5–8.1)
Total Bilirubin: 0.4 mg/dL (ref 0.3–1.2)

## 2015-12-13 LAB — URINALYSIS, ROUTINE W REFLEX MICROSCOPIC
BILIRUBIN URINE: NEGATIVE
Glucose, UA: NEGATIVE mg/dL
Hgb urine dipstick: NEGATIVE
Ketones, ur: NEGATIVE mg/dL
Leukocytes, UA: NEGATIVE
NITRITE: NEGATIVE
PH: 6.5 (ref 5.0–8.0)
Protein, ur: NEGATIVE mg/dL
SPECIFIC GRAVITY, URINE: 1.008 (ref 1.005–1.030)

## 2015-12-13 LAB — CBC
HEMATOCRIT: 38.8 % — AB (ref 39.0–52.0)
HEMOGLOBIN: 13.3 g/dL (ref 13.0–17.0)
MCH: 31.5 pg (ref 26.0–34.0)
MCHC: 34.3 g/dL (ref 30.0–36.0)
MCV: 91.9 fL (ref 78.0–100.0)
Platelets: 216 10*3/uL (ref 150–400)
RBC: 4.22 MIL/uL (ref 4.22–5.81)
RDW: 13.3 % (ref 11.5–15.5)
WBC: 4.8 10*3/uL (ref 4.0–10.5)

## 2015-12-13 LAB — I-STAT TROPONIN, ED: TROPONIN I, POC: 0 ng/mL (ref 0.00–0.08)

## 2015-12-13 LAB — LIPASE, BLOOD: Lipase: 31 U/L (ref 11–51)

## 2015-12-13 MED ORDER — OMEPRAZOLE 20 MG PO CPDR: DELAYED_RELEASE_CAPSULE | ORAL | Status: DC

## 2015-12-13 MED ORDER — GI COCKTAIL ~~LOC~~
30.0000 mL | Freq: Once | ORAL | Status: AC
  Administered 2015-12-13: 30 mL via ORAL
  Filled 2015-12-13: qty 30

## 2015-12-13 NOTE — ED Provider Notes (Signed)
CSN: [id number]     Arrival date & time 12/13/15  1851 History   First MD Initiated Contact with Patient 12/13/15 1956     Chief Complaint  Patient presents with  . Abdominal Pain     (Consider location/radiation/quality/duration/timing/severity/associated sxs/prior Treatment) Patient is a 50 y.o. male presenting with abdominal pain. The history is provided by the patient. No language interpreter was used.  Abdominal Pain Pain location:  Epigastric Pain quality: cramping and sharp   Pain radiates to:  Back Pain severity:  Moderate Onset quality:  Gradual Duration:  3 weeks Timing:  Intermittent Progression:  Waxing and waning Chronicity:  Recurrent Relieved by:  Nothing Exacerbated by: Lying flat makes it worse. Associated symptoms: chest pain (Pain radiates from epigastrium to lower central chest. ) and nausea   Associated symptoms: no chills, no diarrhea, no fever, no shortness of breath and no vomiting     Past Medical History  Diagnosis Date  . Chest pain, mid sternal   . CAD in native artery     a. Nonobstructive cath 11/2007;  b. Presented with ST elevation - Nonobstructive cath 08/2011  . GERD (gastroesophageal reflux disease)   . Tobacco abuse   . Marijuana abuse     a. uses ~ 1x /wk or less  . History of cocaine abuse     a. quit ? 2009  . History of ETOH abuse     a. drinks 2 "40's" / wk  . Bradycardia     a. asymptomatic  . Bipolar disorder ([place])   . Syncope     a. 12/2010 - presumed to be vasovagal  . Abnormal ECG     a. early repolarization  . Coronary artery disease   . Pneumonia   . Stomach ulcer   . Arthritis     "my whole left side" (05/03/2014)   Past Surgical History  Procedure Laterality Date  . Cardiac catheterization  2009; 08/2011    [name] 08/06/2011  . Left heart catheterization with coronary angiogram N/A 08/16/2011    Procedure: LEFT HEART CATHETERIZATION WITH CORONARY ANGIOGRAM;  Surgeon: [name], MD;  Location: [facility] CATH LAB;   Service: Cardiovascular;  Laterality: N/A;   No family history on file. Social History  Substance Use Topics  . Smoking status: Former Smoker -- 0.50 packs/day for 20 years    Types: Cigarettes    Quit date: 06/24/2015  . Smokeless tobacco: Never Used  . Alcohol Use: 28.8 oz/week    48 Cans of beer per week     Comment: heavy drinker "until Jan 17"    Review of Systems  Constitutional: Negative for fever and chills.  Respiratory: Negative.  Negative for shortness of breath.   Cardiovascular: Positive for chest pain (Pain radiates from epigastrium to lower central chest. ).  Gastrointestinal: Positive for nausea and abdominal pain. Negative for vomiting and diarrhea.  Musculoskeletal: Negative.  Negative for myalgias.  Skin: Negative.   Neurological: Negative.       Allergies  Aspirin; Pepperoni; Tomato; and Tylenol  Home Medications   Prior to Admission medications   Medication Sig Start Date End Date Taking? Authorizing Provider  gabapentin (NEURONTIN) 100 MG capsule Take 1 capsule (100 mg total) by mouth 2 (two) times daily. 11/26/15   Kerrie Buffalo, NP  QUEtiapine (SEROQUEL) 50 MG tablet Take 2 tablets (100 mg total) by mouth at bedtime. 11/26/15   Kerrie Buffalo, NP   BP 127/74 mmHg  Pulse 61  Temp(Src) 98.5 F (  36.9 C) (Oral)  Resp 20  Ht 5\' 4"  (1.626 m)  Wt 59.421 kg  BMI 22.47 kg/m2  SpO2 98% Physical Exam  Constitutional: He is oriented to person, place, and time. He appears well-developed and well-nourished.  HENT:  Head: Normocephalic.  Neck: Normal range of motion. Neck supple.  Cardiovascular: Normal rate and regular rhythm.   No murmur heard. Pulmonary/Chest: Effort normal and breath sounds normal. He has no wheezes. He has no rales.  Abdominal: Soft. Bowel sounds are normal. He exhibits no distension. There is tenderness. There is no rebound and no guarding.  Tender across upper abdomen, greatest in the epigastric area. Soft abdomen.    Musculoskeletal: Normal range of motion.  Neurological: He is alert and oriented to person, place, and time.  Skin: Skin is warm and dry. No rash noted.  Psychiatric: He has a normal mood and affect.    ED Course  Procedures (including critical care time) Labs Review Labs Reviewed  COMPREHENSIVE METABOLIC PANEL - Abnormal; Notable for the following:    Glucose, Bld 102 (*)    Total Protein 6.4 (*)    ALT 16 (*)    All other components within normal limits  CBC - Abnormal; Notable for the following:    HCT 38.8 (*)    All other components within normal limits  LIPASE, BLOOD  URINALYSIS, ROUTINE W REFLEX MICROSCOPIC (NOT AT [facility])  I-STAT TROPOININ, ED    Imaging Review No results found. I have personally reviewed and evaluated these images and lab results as part of my medical decision-making.   EKG Interpretation   Date/Time:  Saturday December 13 2015 18:58:56 EDT Ventricular Rate:  66 PR Interval:  152 QRS Duration: 82 QT Interval:  378 QTC Calculation: 396 R Axis:   64 Text Interpretation:  Normal sinus rhythm Normal ECG no significant change  since April 2017 Confirmed by Regenia Skeeter MD, SCOTT [phone]) on 12/13/2015  7:54:51 PM      MDM   Final diagnoses:  None   1. Epigastric pain 2. Dyspepsia  Patient presents with recurrent epigastric pain, sharp in nature, reports similar to gastric ulcers he has had previously. No vomiting. He also reports history of CAD with stenting x 2. Pain has been constant, daily for the past weeks. Negative troponin. Doubt pain is CAD/ACS.  GI cocktail provided - will re-evaluate.   Pain is resolved after GI cocktail. Labs reassuring. EKG without acute findings or significant change since last EKG. He is considered stable for discharge home.     Charlann Lange, PA-C 12/13/15 2145  Sherwood Gambler, MD 12/18/15 [phone]

## 2015-12-13 NOTE — ED Notes (Signed)
Pt reports he has pain in his lower abdomen that radiates up through his chest since he started taking the Quetiapine Fumarate about 3 days ago, associated with nausea. Pt reports history of ulcers.

## 2015-12-13 NOTE — Discharge Instructions (Signed)
Indigestion Indigestion is a feeling of pain, discomfort, burning, or fullness in the upper part of your abdomen. It can come and go. It may occur frequently or rarely. Indigestion tends to occur while you are eating or right after you have finished eating. It may be worse at night and while bending over or lying down. HOME CARE INSTRUCTIONS Take these actions to decrease your pain or discomfort and to help avoid complications. Diet  Follow a diet as recommended by your health care provider. This may involve avoiding foods and drinks such as:  Coffee and tea (with or without caffeine).  Drinks that contain alcohol.  Energy drinks and sports drinks.  Carbonated drinks or sodas.  Chocolate and cocoa.  Peppermint and mint flavorings.  Garlic and onions.  Horseradish.  Spicy and acidic foods, including peppers, chili powder, curry powder, vinegar, hot sauces, and barbecue sauce.  Citrus fruit juices and citrus fruits, such as oranges, lemons, and limes.  Tomato-based foods, such as red sauce, chili, salsa, and pizza with red sauce.  Fried and fatty foods, such as donuts, french fries, potato chips, and high-fat dressings.  High-fat meats, such as hot dogs and fatty cuts of red and white meats, such as rib eye steak, sausage, ham, and bacon.  High-fat dairy items, such as whole milk, butter, and cream cheese.  Eat small, frequent meals instead of large meals.  Avoid drinking large amounts of liquid with your meals.  Avoid eating meals during the 2-3 hours before bedtime.  Avoid lying down right after you eat.  Do not exercise right after you eat. General Instructions  Pay attention to any changes in your symptoms.  Take over-the-counter and prescription medicines only as told by your health care provider. Do not take aspirin, ibuprofen, or other NSAIDs unless your health care provider told you to do so.  Do not use any tobacco products, including cigarettes, chewing  tobacco, and e-cigarettes. If you need help quitting, ask your health care provider.  Wear loose-fitting clothing. Do not wear anything tight around your waist that causes pressure on your abdomen.  Raise (elevate) the head of your bed about 6 inches (15 cm).  Try to reduce your stress, such as with yoga or meditation. If you need help reducing stress, ask your health care provider.  If you are overweight, reduce your weight to an amount that is healthy for you. Ask your health care provider for guidance about a safe weight loss goal.  Keep all follow-up visits as told by your health care provider. This is important. SEEK MEDICAL CARE IF:  You have new symptoms.  You have unexplained weight loss.  You have difficulty swallowing, or it hurts to swallow.  Your symptoms do not improve with treatment.  Your symptoms last for more than two days.  You have a fever.  You vomit. SEEK IMMEDIATE MEDICAL CARE IF:  You have pain in your arms, neck, jaw, teeth, or back.  You feel sweaty, dizzy, or light-headed.  You faint.  You have chest pain or shortness of breath.  You cannot stop vomiting, or you vomit blood.  Your stool is bloody or black.  You have severe pain in your abdomen.   This information is not intended to replace advice given to you by your health care provider. Make sure you discuss any questions you have with your health care provider.   Document Released: 07/01/2004 Document Revised: 02/12/2015 Document Reviewed: 09/18/2014 Elsevier Interactive Patient Education [employer].

## 2015-12-14 ENCOUNTER — Emergency Department (HOSPITAL_COMMUNITY)
Admission: EM | Admit: 2015-12-14 | Discharge: 2015-12-14 | Disposition: A | Discharge status: Home / Self Care | Payer: Medicaid Other | Attending: Emergency Medicine | Admitting: Emergency Medicine

## 2015-12-14 ENCOUNTER — Encounter (HOSPITAL_COMMUNITY): Payer: Self-pay | Admitting: Emergency Medicine

## 2015-12-14 DIAGNOSIS — Z87891 Personal history of nicotine dependence: Secondary | ICD-10-CM | POA: Insufficient documentation

## 2015-12-14 DIAGNOSIS — R1013 Epigastric pain: Secondary | ICD-10-CM | POA: Insufficient documentation

## 2015-12-14 DIAGNOSIS — Z79899 Other long term (current) drug therapy: Secondary | ICD-10-CM | POA: Insufficient documentation

## 2015-12-14 DIAGNOSIS — I2510 Atherosclerotic heart disease of native coronary artery without angina pectoris: Secondary | ICD-10-CM | POA: Insufficient documentation

## 2015-12-14 MED ORDER — GI COCKTAIL ~~LOC~~
30.0000 mL | Freq: Once | ORAL | Status: AC
  Administered 2015-12-14: 30 mL via ORAL
  Filled 2015-12-14: qty 30

## 2015-12-14 NOTE — ED Notes (Signed)
Brought by EMS from home for c/o epigastric pain going on for about 10 days.  Was seen yesterday and diagnosed with dyspepsia.  Ate spaghetti today which made pain worse.  Did not get script filled.  Rating pain at 10/10 describes as intense pain.

## 2015-12-14 NOTE — ED Provider Notes (Signed)
CSN: [id number]     Arrival date & time 12/14/15  2134 History   First MD Initiated Contact with Patient 12/14/15 2155     Chief Complaint  Patient presents with  . Abdominal Pain     (Consider location/radiation/quality/duration/timing/severity/associated sxs/prior Treatment) HPI Comments: Patient with history of CAD, GERD, ulcers, history of polysubstance abuse including cocaine and alcohol, presents with pain in epigastrium. Seen in emergency department last night for same. He states he had relief when he was discharged that did not return until this afternoon after eating. No fever, hematemesis, chest pain, SOB or melena.  Patient is a 50 y.o. male presenting with abdominal pain. The history is provided by the patient. No language interpreter was used.  Abdominal Pain Pain quality: burning, cramping and sharp   Pain radiates to:  Back Pain severity:  Moderate Duration:  10 days Timing:  Constant Chronicity:  Recurrent Associated symptoms: nausea   Associated symptoms: no chest pain, no chills, no fever and no shortness of breath     Past Medical History  Diagnosis Date  . Chest pain, mid sternal   . CAD in native artery     a. Nonobstructive cath 11/2007;  b. Presented with ST elevation - Nonobstructive cath 08/2011  . GERD (gastroesophageal reflux disease)   . Tobacco abuse   . Marijuana abuse     a. uses ~ 1x /wk or less  . History of cocaine abuse     a. quit ? 2009  . History of ETOH abuse     a. drinks 2 "40's" / wk  . Bradycardia     a. asymptomatic  . Bipolar disorder ([name])   . Syncope     a. 12/2010 - presumed to be vasovagal  . Abnormal ECG     a. early repolarization  . Coronary artery disease   . Pneumonia   . Stomach ulcer   . Arthritis     "my whole left side" (05/03/2014)   Past Surgical History  Procedure Laterality Date  . Cardiac catheterization  2009; 08/2011    [name] 08/06/2011  . Left heart catheterization with coronary angiogram N/A 08/16/2011     Procedure: LEFT HEART CATHETERIZATION WITH CORONARY ANGIOGRAM;  Surgeon: [name], MD;  Location: [facility] CATH LAB;  Service: Cardiovascular;  Laterality: N/A;   No family history on file. Social History  Substance Use Topics  . Smoking status: Former Smoker -- 0.50 packs/day for 20 years    Types: Cigarettes    Quit date: 06/24/2015  . Smokeless tobacco: Never Used  . Alcohol Use: 28.8 oz/week    48 Cans of beer per week     Comment: heavy drinker "until Jan 17"    Review of Systems  Constitutional: Negative for fever and chills.  Respiratory: Negative.  Negative for shortness of breath.   Cardiovascular: Negative.  Negative for chest pain.  Gastrointestinal: Positive for nausea and abdominal pain. Negative for blood in stool.  Musculoskeletal: Positive for back pain.  Skin: Negative.  Negative for pallor.  Neurological: Negative.       Allergies  Aspirin; Pepperoni; Tomato; and Tylenol  Home Medications   Prior to Admission medications   Medication Sig Start Date End Date Taking? Authorizing Provider  gabapentin (NEURONTIN) 100 MG capsule Take 1 capsule (100 mg total) by mouth 2 (two) times daily. 11/26/15   Kerrie Buffalo, NP  omeprazole (PRILOSEC) 20 MG capsule Take one tablet two times a day for 5 days, then once  daily after that 12/13/15   Charlann Lange, PA-C  QUEtiapine (SEROQUEL) 50 MG tablet Take 2 tablets (100 mg total) by mouth at bedtime. 11/26/15   Kerrie Buffalo, NP   BP 111/83 mmHg  Pulse 57  Temp(Src) 98.6 F (37 C) (Oral)  Resp 18  Ht 5\' 3"  (1.6 m)  Wt 58.968 kg  BMI 23.03 kg/m2  SpO2 98% Physical Exam  Constitutional: He is oriented to person, place, and time. He appears well-developed and well-nourished.  HENT:  Mouth/Throat: Oropharynx is clear and moist.  Neck: Normal range of motion.  Cardiovascular: Bradycardia present.   No murmur heard. Pulmonary/Chest: Effort normal and breath sounds normal. He has no wheezes. He has no rales.   Abdominal: Soft.  Epigastric tenderness. Soft abdomen with moderate guarding. No rigidity  Musculoskeletal: Normal range of motion.  Neurological: He is alert and oriented to person, place, and time.  Skin: Skin is warm and dry.  Psychiatric: He has a normal mood and affect.    ED Course  Procedures (including critical care time) Labs Review Labs Reviewed - No data to display  Imaging Review No results found. I have personally reviewed and evaluated these images and lab results as part of my medical decision-making.   EKG Interpretation None      MDM   Final diagnoses:  None    1. Dyspepsia  Patient again given a GI cocktail with relief of symptoms. Strongly encouraged taking the prescribed medications and following up with primary care.     Charlann Lange, PA-C 12/17/15 [place], MD 12/17/15 [phone]

## 2015-12-14 NOTE — Discharge Instructions (Signed)
YOUR WERE GIVEN PRESCRIPTIONS LAST NIGHT FOR PRILOSEC (OMEPRAZOLE) AND SHOULD BE TAKING THIS FOR STOMACH PAIN AS DIRECTED WHICH IS TWICE DAILY FOR THE FIRST 5 DAYS, THEN ONCE DAILY AFTER THAT. DO NOT DRINK ALCOHOL, EAT TOMATOES OR ANYTHING ACIDIC LIKE ORANGE JUICE. SMOKING CIGARETTES CAN MAKE THIS PAIN WORSE. FOLLOW UP WITH YOUR DOCTOR OR WITH [facility] FOR FURTHER EVALUATION AS TO WHETHER YOU HAVE RECURRENT ULCERS.   Food Choices for Gastroesophageal Reflux Disease, Adult When you have gastroesophageal reflux disease (GERD), the foods you eat and your eating habits are very important. Choosing the right foods can help ease the discomfort of GERD. WHAT GENERAL GUIDELINES DO I NEED TO FOLLOW?  Choose fruits, vegetables, whole grains, low-fat dairy products, and low-fat meat, fish, and poultry.  Limit fats such as oils, salad dressings, butter, nuts, and avocado.  Keep a food diary to identify foods that cause symptoms.  Avoid foods that cause reflux. These may be different for different people.  Eat frequent small meals instead of three large meals each day.  Eat your meals slowly, in a relaxed setting.  Limit fried foods.  Cook foods using methods other than frying.  Avoid drinking alcohol.  Avoid drinking large amounts of liquids with your meals.  Avoid bending over or lying down until 2-3 hours after eating. WHAT FOODS ARE NOT RECOMMENDED? The following are some foods and drinks that may worsen your symptoms: Vegetables Tomatoes. Tomato juice. Tomato and spaghetti sauce. Chili peppers. Onion and garlic. Horseradish. Fruits Oranges, grapefruit, and lemon (fruit and juice). Meats High-fat meats, fish, and poultry. This includes hot dogs, ribs, ham, sausage, salami, and bacon. Dairy Whole milk and chocolate milk. Sour cream. Cream. Butter. Ice cream. Cream cheese.  Beverages Coffee and tea, with or without caffeine. Carbonated beverages or energy  drinks. Condiments Hot sauce. Barbecue sauce.  Sweets/Desserts Chocolate and cocoa. Donuts. Peppermint and spearmint. Fats and Oils High-fat foods, including [place] fries and potato chips. Other Vinegar. Strong spices, such as black pepper, white pepper, red pepper, cayenne, curry powder, cloves, ginger, and chili powder. The items listed above may not be a complete list of foods and beverages to avoid. Contact your dietitian for more information.   This information is not intended to replace advice given to you by your health care provider. Make sure you discuss any questions you have with your health care provider.   Document Released: 05/24/2005 Document Revised: 06/14/2014 Document Reviewed: 03/28/2013 Elsevier Interactive Patient Education [employer].

## 2015-12-22 ENCOUNTER — Emergency Department (HOSPITAL_COMMUNITY)
Admission: EM | Admit: 2015-12-22 | Discharge: 2015-12-23 | Disposition: A | Discharge status: Home / Self Care | Payer: Medicaid Other | Attending: Emergency Medicine | Admitting: Emergency Medicine

## 2015-12-22 ENCOUNTER — Encounter (HOSPITAL_COMMUNITY): Payer: Self-pay | Admitting: *Deleted

## 2015-12-22 DIAGNOSIS — I2510 Atherosclerotic heart disease of native coronary artery without angina pectoris: Secondary | ICD-10-CM | POA: Insufficient documentation

## 2015-12-22 DIAGNOSIS — R0789 Other chest pain: Secondary | ICD-10-CM | POA: Diagnosis present

## 2015-12-22 DIAGNOSIS — F17210 Nicotine dependence, cigarettes, uncomplicated: Secondary | ICD-10-CM | POA: Insufficient documentation

## 2015-12-22 DIAGNOSIS — Z79899 Other long term (current) drug therapy: Secondary | ICD-10-CM | POA: Insufficient documentation

## 2015-12-22 DIAGNOSIS — R51 Headache: Secondary | ICD-10-CM | POA: Diagnosis not present

## 2015-12-22 DIAGNOSIS — K219 Gastro-esophageal reflux disease without esophagitis: Secondary | ICD-10-CM | POA: Insufficient documentation

## 2015-12-22 DIAGNOSIS — R519 Headache, unspecified: Secondary | ICD-10-CM

## 2015-12-22 LAB — I-STAT CHEM 8, ED
BUN: 13 mg/dL (ref 6–20)
CHLORIDE: 102 mmol/L (ref 101–111)
Calcium, Ion: 1.17 mmol/L (ref 1.13–1.30)
Creatinine, Ser: 1 mg/dL (ref 0.61–1.24)
Glucose, Bld: 97 mg/dL (ref 65–99)
HCT: 45 % (ref 39.0–52.0)
Hemoglobin: 15.3 g/dL (ref 13.0–17.0)
POTASSIUM: 4.3 mmol/L (ref 3.5–5.1)
SODIUM: 136 mmol/L (ref 135–145)
TCO2: 24 mmol/L (ref 0–100)

## 2015-12-22 LAB — I-STAT TROPONIN, ED: TROPONIN I, POC: 0 ng/mL (ref 0.00–0.08)

## 2015-12-22 MED ORDER — RANITIDINE HCL 150 MG/10ML PO SYRP
150.0000 mg | ORAL_SOLUTION | Freq: Once | ORAL | Status: AC
  Administered 2015-12-22: 150 mg via ORAL
  Filled 2015-12-22: qty 10

## 2015-12-22 MED ORDER — GI COCKTAIL ~~LOC~~
30.0000 mL | Freq: Once | ORAL | Status: AC
  Administered 2015-12-22: 30 mL via ORAL
  Filled 2015-12-22: qty 30

## 2015-12-22 NOTE — ED Notes (Signed)
Pt c/o centeralized chest pain, reports hx of reflux which he reports this "sort of feels like"  Denies any SOB, N/V, sweating, radiating pain.  Reports has been complaint w/ medications.

## 2015-12-22 NOTE — ED Notes (Signed)
Patient states about 2 days ago he started with severe upper gastric pain.  States he was told he has reflux but no meds seem to help.  States it is like a stabbing pain that comes and goes.  Only drinks milk because he feels like that is the only thing that might make it feel better.  Stated the GI cocktail works until he gets home then he is back in the same shape

## 2015-12-22 NOTE — ED Provider Notes (Signed)
The patient is a 50 year old male who has a history of nonobstructive coronary disease per the medical record, he also has a history of severe acid reflux and gastritis, he has had admissions to the hospital in the past where he was worked up for the chest pain with a negative workup, he improved on antacid medications including proton pump inhibitors. He reports that he has had chest pain all day long, it is located in the lower abdomen radiating up to the lower chest but never goes any higher, is his typical burning chest pain with his acid reflux and on his arrival here at the pain went away with a GI cocktail. Since that time he has developed a mild headache, he has no other complaints. On exam the patient has a soft nontender abdomen except the epigastrium which is tender to palpation but non-peritoneal. His lungs are clear, heart sounds are clear, EKG is unremarkable and unchanged from prior EKGs with mild diffuse ST abnormalities. He has no positional changes and pain, has no rubs or gallops, I doubt that this is pericarditis or acute coronary syndrome.  I saw and evaluated the patient, reviewed the resident's note and I agree with the findings and plan.  .  EKG Interpretation  Date/Time:  Monday December 22 2015 22:18:41 EDT Ventricular Rate:  64 PR Interval:    QRS Duration: 82 QT Interval:  367 QTC Calculation: 379 R Axis:   55 Text Interpretation:  Sinus rhythm RSR' in V1 or V2, probably normal variant ST elevation suggests acute pericarditis since last tracing no significant change Confirmed by Wilian Kwong  MD, Shawni Volkov ([id number]) on 12/22/2015 11:20:46 PM       Final diagnoses:  Gastroesophageal reflux disease, esophagitis presence not specified  Acute nonintractable headache, unspecified headache type      Noemi Chapel, MD 12/23/15 [phone]

## 2015-12-22 NOTE — ED Provider Notes (Signed)
CSN: [id number]     Arrival date & time 12/22/15  2211 History   First MD Initiated Contact with Patient 12/22/15 2217     Chief Complaint  Patient presents with  . Chest Pain     (Consider location/radiation/quality/duration/timing/severity/associated sxs/prior Treatment) Patient is a 50 y.o. male presenting with chest pain. The history is provided by the patient.  Chest Pain Pain location:  Substernal area Pain quality: burning   Radiates to: back of throat. Pain radiates to the back: no   Pain severity:  Moderate Onset quality:  Gradual Timing:  Constant Progression:  Unchanged Chronicity:  Recurrent Relieved by: milk. Associated symptoms: anorexia, headache and heartburn   Associated symptoms: no abdominal pain, no altered mental status, no back pain, no dizziness, no dysphagia, no fatigue, no fever, no shortness of breath and no syncope     Past Medical History  Diagnosis Date  . Chest pain, mid sternal   . CAD in native artery     a. Nonobstructive cath 11/2007;  b. Presented with ST elevation - Nonobstructive cath 08/2011  . GERD (gastroesophageal reflux disease)   . Tobacco abuse   . Marijuana abuse     a. uses ~ 1x /wk or less  . History of cocaine abuse     a. quit ? 2009  . History of ETOH abuse     a. drinks 2 "40's" / wk  . Bradycardia     a. asymptomatic  . Bipolar disorder ([place])   . Syncope     a. 12/2010 - presumed to be vasovagal  . Abnormal ECG     a. early repolarization  . Coronary artery disease   . Pneumonia   . Stomach ulcer   . Arthritis     "my whole left side" (05/03/2014)   Past Surgical History  Procedure Laterality Date  . Cardiac catheterization  2009; 08/2011    [name] 08/06/2011  . Left heart catheterization with coronary angiogram N/A 08/16/2011    Procedure: LEFT HEART CATHETERIZATION WITH CORONARY ANGIOGRAM;  Surgeon: [name], MD;  Location: [facility] CATH LAB;  Service: Cardiovascular;  Laterality: N/A;   No family history  on file. Social History  Substance Use Topics  . Smoking status: Current Some Day Smoker -- 0.50 packs/day for 20 years    Types: Cigarettes    Last Attempt to Quit: 06/24/2015  . Smokeless tobacco: Never Used  . Alcohol Use: 28.8 oz/week    48 Cans of beer per week     Comment: none for 2-3 weeks    Review of Systems  Constitutional: Negative for fever and fatigue.  HENT: Negative for congestion and trouble swallowing.   Eyes: Negative for visual disturbance.  Respiratory: Negative for shortness of breath.   Cardiovascular: Positive for chest pain. Negative for syncope.  Gastrointestinal: Positive for heartburn and anorexia. Negative for abdominal pain.  Genitourinary: Negative for dysuria.  Musculoskeletal: Negative for back pain.  Skin: Negative for pallor and wound.  Neurological: Positive for headaches. Negative for dizziness.  Psychiatric/Behavioral: Negative for confusion and agitation.      Allergies  Aspirin; Pepperoni; Tomato; and Tylenol  Home Medications   Prior to Admission medications   Medication Sig Start Date End Date Taking? Authorizing Provider  omeprazole (PRILOSEC) 20 MG capsule Take 20 mg by mouth 2 (two) times daily before a meal.   Yes Historical Provider, MD  omeprazole (PRILOSEC) 20 MG capsule Take 1 capsule (20 mg total) by mouth 2 (two) times  daily before a meal. 12/23/15   Allie Bossier, MD  QUEtiapine (SEROQUEL) 50 MG tablet Take 2 tablets (100 mg total) by mouth at bedtime. Patient not taking: Reported on 12/22/2015 11/26/15   Kerrie Buffalo, NP   BP 113/71 mmHg  Pulse 59  Temp(Src) 99.1 F (37.3 C) (Oral)  Resp 22  SpO2 97% Physical Exam  Constitutional: He is oriented to person, place, and time. He appears well-developed and well-nourished. No distress.  HENT:  Head: Normocephalic and atraumatic.  Eyes: Conjunctivae are normal.  Cardiovascular: Normal rate and normal heart sounds.   No murmur heard. Pulmonary/Chest: Effort normal and  breath sounds normal.  Abdominal: Soft. There is no tenderness.  Musculoskeletal: He exhibits no edema.  Neurological: He is alert and oriented to person, place, and time.  Skin: Skin is warm. He is not diaphoretic.  Psychiatric: He has a normal mood and affect. His behavior is normal.  Nursing note and vitals reviewed.   ED Course  Procedures (including critical care time) Labs Review Labs Reviewed  I-STAT CHEM 8, ED  I-STAT TROPOININ, ED    Imaging Review No results found. I have personally reviewed and evaluated these images and lab results as part of my medical decision-making.   EKG Interpretation   Date/Time:  Monday December 22 2015 22:18:41 EDT Ventricular Rate:  64 PR Interval:    QRS Duration: 82 QT Interval:  367 QTC Calculation: 379 R Axis:   55 Text Interpretation:  Sinus rhythm RSR' in V1 or V2, probably normal  variant ST elevation suggests acute pericarditis since last tracing no  significant change Confirmed by MILLER  MD, BRIAN ([id number]) on 12/22/2015  11:20:46 PM      MDM   Final diagnoses:  Gastroesophageal reflux disease, esophagitis presence not specified  Acute nonintractable headache, unspecified headache type    Patient presents with complaints of burning pain radiating to the back of his throat consistent with previous episodes of GERD. History and presentation inconsistent with cardiac etiology of his pain without associated shortness of breath or exertional pain. He is able to exert himself without exacerbation of the pain. Pain resolved with GI cocktail. He was given Reglan and Celebrex in the ED for his headache with improvement in the headache. EKG shows sinus rhythm with ventricular rate of 64 and no evidence of new cardiac ischemia, dysrhythmia, or abnormal intervals. EKG similar to previous. Screening BMP and troponin unremarkable. The patient was discharged home in good condition with prescription for omeprazole as he states he has run out  recently. He was given strict return precautions.    Allie Bossier, MD 12/23/15 [id number]  Noemi Chapel, MD 12/23/15 [phone]

## 2015-12-23 MED ORDER — OMEPRAZOLE 20 MG PO CPDR: 20.0000 mg | DELAYED_RELEASE_CAPSULE | Freq: Two times a day (BID) | ORAL | Status: DC

## 2015-12-23 MED ORDER — CELECOXIB 400 MG PO CAPS
400.0000 mg | ORAL_CAPSULE | Freq: Once | ORAL | Status: AC
  Administered 2015-12-23: 400 mg via ORAL
  Filled 2015-12-23: qty 1

## 2015-12-23 MED ORDER — CLOPIDOGREL BISULFATE 300 MG PO TABS: 300.0000 mg | ORAL_TABLET | Freq: Once | ORAL | Status: DC

## 2015-12-23 MED ORDER — METOCLOPRAMIDE HCL 10 MG PO TABS
10.0000 mg | ORAL_TABLET | Freq: Once | ORAL | Status: AC
  Administered 2015-12-23: 10 mg via ORAL
  Filled 2015-12-23: qty 1

## 2015-12-23 MED ORDER — NITROGLYCERIN 0.4 MG SL SUBL: 0.4000 mg | SUBLINGUAL_TABLET | SUBLINGUAL | Status: DC | PRN

## 2015-12-23 NOTE — ED Notes (Signed)
Discharge instructions and prescription reviewed - voiced understanding.  

## 2016-03-04 ENCOUNTER — Encounter (HOSPITAL_COMMUNITY): Payer: Self-pay

## 2016-03-04 ENCOUNTER — Emergency Department (HOSPITAL_COMMUNITY)
Admission: EM | Admit: 2016-03-04 | Discharge: 2016-03-04 | Disposition: A | Discharge status: Other Acute Inpatient Hospital | Payer: Medicaid Other | Attending: Emergency Medicine | Admitting: Emergency Medicine

## 2016-03-04 ENCOUNTER — Encounter (HOSPITAL_COMMUNITY): Payer: Self-pay | Admitting: *Deleted

## 2016-03-04 ENCOUNTER — Inpatient Hospital Stay (HOSPITAL_COMMUNITY)
Admission: AD | Admit: 2016-03-04 | Discharge: 2016-03-08 | DRG: 885 | Disposition: A | Discharge status: Home / Self Care | Payer: Medicaid Other | Source: Intra-hospital | Attending: Psychiatry | Admitting: Psychiatry

## 2016-03-04 DIAGNOSIS — F1024 Alcohol dependence with alcohol-induced mood disorder: Secondary | ICD-10-CM

## 2016-03-04 DIAGNOSIS — F319 Bipolar disorder, unspecified: Principal | ICD-10-CM | POA: Diagnosis present

## 2016-03-04 DIAGNOSIS — F17210 Nicotine dependence, cigarettes, uncomplicated: Secondary | ICD-10-CM | POA: Insufficient documentation

## 2016-03-04 DIAGNOSIS — F314 Bipolar disorder, current episode depressed, severe, without psychotic features: Secondary | ICD-10-CM | POA: Diagnosis present

## 2016-03-04 DIAGNOSIS — F1910 Other psychoactive substance abuse, uncomplicated: Secondary | ICD-10-CM

## 2016-03-04 DIAGNOSIS — K219 Gastro-esophageal reflux disease without esophagitis: Secondary | ICD-10-CM | POA: Diagnosis present

## 2016-03-04 DIAGNOSIS — I2510 Atherosclerotic heart disease of native coronary artery without angina pectoris: Secondary | ICD-10-CM | POA: Diagnosis present

## 2016-03-04 DIAGNOSIS — Z5181 Encounter for therapeutic drug level monitoring: Secondary | ICD-10-CM | POA: Insufficient documentation

## 2016-03-04 DIAGNOSIS — F1020 Alcohol dependence, uncomplicated: Secondary | ICD-10-CM | POA: Diagnosis present

## 2016-03-04 DIAGNOSIS — Z79899 Other long term (current) drug therapy: Secondary | ICD-10-CM | POA: Insufficient documentation

## 2016-03-04 DIAGNOSIS — F10230 Alcohol dependence with withdrawal, uncomplicated: Secondary | ICD-10-CM | POA: Diagnosis present

## 2016-03-04 DIAGNOSIS — R45851 Suicidal ideations: Secondary | ICD-10-CM | POA: Diagnosis present

## 2016-03-04 DIAGNOSIS — F1410 Cocaine abuse, uncomplicated: Secondary | ICD-10-CM | POA: Insufficient documentation

## 2016-03-04 DIAGNOSIS — F1414 Cocaine abuse with cocaine-induced mood disorder: Secondary | ICD-10-CM | POA: Diagnosis present

## 2016-03-04 DIAGNOSIS — F329 Major depressive disorder, single episode, unspecified: Secondary | ICD-10-CM | POA: Insufficient documentation

## 2016-03-04 DIAGNOSIS — G4700 Insomnia, unspecified: Secondary | ICD-10-CM | POA: Diagnosis present

## 2016-03-04 DIAGNOSIS — F32A Depression, unspecified: Secondary | ICD-10-CM

## 2016-03-04 LAB — RAPID URINE DRUG SCREEN, HOSP PERFORMED
AMPHETAMINES: NOT DETECTED
BARBITURATES: NOT DETECTED
Benzodiazepines: NOT DETECTED
Cocaine: POSITIVE — AB
Opiates: NOT DETECTED
TETRAHYDROCANNABINOL: NOT DETECTED

## 2016-03-04 LAB — COMPREHENSIVE METABOLIC PANEL
ALK PHOS: 64 U/L (ref 38–126)
ALT: 12 U/L — AB (ref 17–63)
AST: 20 U/L (ref 15–41)
Albumin: 4.6 g/dL (ref 3.5–5.0)
Anion gap: 11 (ref 5–15)
CALCIUM: 9.4 mg/dL (ref 8.9–10.3)
CO2: 21 mmol/L — AB (ref 22–32)
CREATININE: 1.13 mg/dL (ref 0.61–1.24)
Chloride: 107 mmol/L (ref 101–111)
GFR calc Af Amer: >60 mL/min (ref >60)
GFR calc non Af Amer: >60 mL/min (ref >60)
Glucose, Bld: 91 mg/dL (ref 65–99)
Potassium: 3.6 mmol/L (ref 3.5–5.1)
Sodium: 139 mmol/L (ref 135–145)
Total Bilirubin: 0.7 mg/dL (ref 0.3–1.2)
Total Protein: 7.9 g/dL (ref 6.5–8.1)

## 2016-03-04 LAB — CBC
HEMATOCRIT: 44.7 % (ref 39.0–52.0)
HEMOGLOBIN: 15.7 g/dL (ref 13.0–17.0)
MCH: 31.7 pg (ref 26.0–34.0)
MCHC: 35.1 g/dL (ref 30.0–36.0)
MCV: 90.1 fL (ref 78.0–100.0)
Platelets: 247 10*3/uL (ref 150–400)
RBC: 4.96 MIL/uL (ref 4.22–5.81)
RDW: 13.9 % (ref 11.5–15.5)
WBC: 6.1 10*3/uL (ref 4.0–10.5)

## 2016-03-04 LAB — ETHANOL: Alcohol, Ethyl (B): 203 mg/dL — ABNORMAL HIGH (ref <5)

## 2016-03-04 LAB — SALICYLATE LEVEL: Salicylate Lvl: <4 mg/dL (ref 2.8–30.0)

## 2016-03-04 LAB — ACETAMINOPHEN LEVEL: Acetaminophen (Tylenol), Serum: <10 ug/mL — ABNORMAL LOW (ref 10–30)

## 2016-03-04 MED ORDER — LORAZEPAM 1 MG PO TABS
0.0000 mg | ORAL_TABLET | Freq: Four times a day (QID) | ORAL | Status: DC
  Administered 2016-03-04: 1 mg via ORAL
  Filled 2016-03-04: qty 1

## 2016-03-04 MED ORDER — THIAMINE HCL 100 MG/ML IJ SOLN: 100.0000 mg | Freq: Every day | INTRAMUSCULAR | Status: DC

## 2016-03-04 MED ORDER — QUETIAPINE FUMARATE 100 MG PO TABS
100.0000 mg | ORAL_TABLET | Freq: Every day | ORAL | Status: DC
  Administered 2016-03-05 – 2016-03-07 (×4): 100 mg via ORAL
  Filled 2016-03-04 (×5): qty 1

## 2016-03-04 MED ORDER — DIPHENHYDRAMINE HCL 50 MG/ML IJ SOLN
12.5000 mg | Freq: Once | INTRAMUSCULAR | Status: AC
  Administered 2016-03-04: 12.5 mg via INTRAVENOUS
  Filled 2016-03-04: qty 1

## 2016-03-04 MED ORDER — KETOROLAC TROMETHAMINE 30 MG/ML IJ SOLN
30.0000 mg | Freq: Once | INTRAMUSCULAR | Status: AC
  Administered 2016-03-04: 30 mg via INTRAVENOUS
  Filled 2016-03-04: qty 1

## 2016-03-04 MED ORDER — LORAZEPAM 1 MG PO TABS: 0.0000 mg | ORAL_TABLET | Freq: Two times a day (BID) | ORAL | Status: DC

## 2016-03-04 MED ORDER — LORAZEPAM 2 MG/ML IJ SOLN: 0.0000 mg | Freq: Two times a day (BID) | INTRAMUSCULAR | Status: DC

## 2016-03-04 MED ORDER — METOCLOPRAMIDE HCL 5 MG/ML IJ SOLN
10.0000 mg | Freq: Once | INTRAMUSCULAR | Status: AC
  Administered 2016-03-04: 10 mg via INTRAVENOUS
  Filled 2016-03-04: qty 2

## 2016-03-04 MED ORDER — MAGNESIUM HYDROXIDE 400 MG/5ML PO SUSP: 30.0000 mL | Freq: Every day | ORAL | Status: DC | PRN

## 2016-03-04 MED ORDER — VITAMIN B-1 100 MG PO TABS
100.0000 mg | ORAL_TABLET | Freq: Every day | ORAL | Status: DC
  Administered 2016-03-04: 100 mg via ORAL
  Filled 2016-03-04: qty 1

## 2016-03-04 MED ORDER — VITAMIN B-1 100 MG PO TABS
100.0000 mg | ORAL_TABLET | Freq: Every day | ORAL | Status: DC
  Administered 2016-03-05 – 2016-03-08 (×4): 100 mg via ORAL
  Filled 2016-03-04 (×5): qty 1

## 2016-03-04 MED ORDER — SODIUM CHLORIDE 0.9 % IV BOLUS (SEPSIS)
1000.0000 mL | Freq: Once | INTRAVENOUS | Status: AC
  Administered 2016-03-04: 1000 mL via INTRAVENOUS

## 2016-03-04 MED ORDER — LORAZEPAM 2 MG/ML IJ SOLN: 0.0000 mg | Freq: Four times a day (QID) | INTRAMUSCULAR | Status: DC

## 2016-03-04 MED ORDER — ALUM & MAG HYDROXIDE-SIMETH 200-200-20 MG/5ML PO SUSP: 30.0000 mL | ORAL | Status: DC | PRN

## 2016-03-04 MED ORDER — LORAZEPAM 1 MG PO TABS
0.0000 mg | ORAL_TABLET | Freq: Four times a day (QID) | ORAL | Status: DC
  Administered 2016-03-05 (×2): 2 mg via ORAL
  Filled 2016-03-04 (×2): qty 2

## 2016-03-04 NOTE — ED Provider Notes (Signed)
5:15 PM- Care assumed from Gay Filler PA-C at shift change, pt here with SI, TTS consult done and recommend OBS with AM psych eval, to be transferred to [facility] OBS bed #3 at 7:30 pm to care of Gust Rung NP. Will ensure EMTALA filed <1hr prior to departure time. Pt stable at this time, and was already medically cleared by prior providers.   8:24 PM- Nursing staff states he's cleared to go to [facility], updated EMTALA. Pt stable at time of transfer.    Nemesis Rainwater Camprubi-Soms, PA-C 03/04/16 2024    Forde Dandy, MD 03/06/16 [phone]

## 2016-03-04 NOTE — ED Provider Notes (Signed)
[facility] DEPT Provider Note   CSN: [id number] Arrival date & time: 03/04/16  0217     History   Chief Complaint Chief Complaint  Patient presents with  . Suicidal   HPI   [name] [name] is an 50 y.o. male with history of bipolar disorder and polysubstance abuse who presents to the ED for evaluation of depression and suicidal thoughts. He states he has been very depressed and feeling suicidal but no specific plan. He states he hears voices in his head. He is afraid he might hurt himself. He states he has been drinking and using crack. Last drink two hours prior to arrival. He is tearful. States he is not currently on any psychiatric medications. He reports a headache but denies other physical complaints.  Past Medical History:  Diagnosis Date  . Abnormal ECG    a. early repolarization  . Arthritis    "my whole left side" (05/03/2014)  . Bipolar disorder ([place])   . Bradycardia    a. asymptomatic  . CAD in native artery    a. Nonobstructive cath 11/2007;  b. Presented with ST elevation - Nonobstructive cath 08/2011  . Chest pain, mid sternal   . Coronary artery disease   . GERD (gastroesophageal reflux disease)   . History of cocaine abuse    a. quit ? 2009  . History of ETOH abuse    a. drinks 2 "40's" / wk  . Marijuana abuse    a. uses ~ 1x /wk or less  . Pneumonia   . Stomach ulcer   . Syncope    a. 12/2010 - presumed to be vasovagal  . Tobacco abuse     Patient Active Problem List   Diagnosis Date Noted  . Alcohol dependence with uncomplicated withdrawal ([place])   . Bipolar affective disorder, depressed, severe ([place]) 11/22/2015  . Alcohol use disorder, severe, dependence ([place]) 01/18/2015  . 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 Suicidal ideation   . Acute pancreatitis 03/20/2012  . Polysubstance abuse 03/20/2012  . Cocaine abuse 01/12/2012  . Cannabis abuse 01/12/2012  . Alcohol dependence ([place]) 10/15/2011  . Abnormal ECG   . Chest  pain   . CAD in native artery   . GERD (gastroesophageal reflux disease)   . Tobacco abuse   . History of cocaine abuse   . History of ETOH abuse   . Bradycardia   . Syncope     Past Surgical History:  Procedure Laterality Date  . CARDIAC CATHETERIZATION  2009; 08/2011   [name] 08/06/2011  . LEFT HEART CATHETERIZATION WITH CORONARY ANGIOGRAM N/A 08/16/2011   Procedure: LEFT HEART CATHETERIZATION WITH CORONARY ANGIOGRAM;  Surgeon: [name], MD;  Location: [facility] CATH LAB;  Service: Cardiovascular;  Laterality: N/A;       Home Medications    Prior to Admission medications   Medication Sig Start Date End Date Taking? Authorizing Provider  omeprazole (PRILOSEC) 20 MG capsule Take 20 mg by mouth 2 (two) times daily before a meal.    Historical Provider, MD  omeprazole (PRILOSEC) 20 MG capsule Take 1 capsule (20 mg total) by mouth 2 (two) times daily before a meal. 12/23/15   Allie Bossier, MD  QUEtiapine (SEROQUEL) 50 MG tablet Take 2 tablets (100 mg total) by mouth at bedtime. Patient not taking: Reported on 12/22/2015 11/26/15   Kerrie Buffalo, NP    Family History No family history on file.  Social History Social History  Substance Use Topics  .  Smoking status: Current Some Day Smoker    Packs/day: 0.50    Years: 20.00    Types: Cigarettes    Last attempt to quit: 06/24/2015  . Smokeless tobacco: Never Used  . Alcohol use 28.8 oz/week    48 Cans of beer per week     Comment: none for 2-3 weeks     Allergies   Aspirin; Pepperoni [pickled meat]; Tomato; and Tylenol [acetaminophen]   Review of Systems Review of Systems  All other systems reviewed and are negative.    Physical Exam Updated Vital Signs BP 112/88   Pulse 90   Temp 98.3 F (36.8 C) (Oral)   Resp 17   Ht 5\' 4"  (1.626 m)   Wt 59 kg   SpO2 97%   BMI 22.31 kg/m   Physical Exam  Constitutional: He is oriented to person, place, and time. No distress.  Crying, sobbing  HENT:  Head:  Atraumatic.  Right Ear: External ear normal.  Left Ear: External ear normal.  Nose: Nose normal.  Eyes: Conjunctivae are normal. No scleral icterus.  Cardiovascular: Normal rate and regular rhythm.   Pulmonary/Chest: Effort normal. No respiratory distress.  Abdominal: He exhibits no distension.  Neurological: He is alert and oriented to person, place, and time.  Skin: Skin is warm and dry. He is not diaphoretic.  Psychiatric: His affect is labile. He exhibits a depressed mood. He expresses suicidal ideation. He expresses no homicidal ideation.  Nursing note and vitals reviewed.    ED Treatments / Results  Labs (all labs ordered are listed, but only abnormal results are displayed) Labs Reviewed  COMPREHENSIVE METABOLIC PANEL - Abnormal; Notable for the following:       Result Value   CO2 21 (*)    BUN <5 (*)    ALT 12 (*)    All other components within normal limits  ETHANOL - Abnormal; Notable for the following:    Alcohol, Ethyl (B) 203 (*)    All other components within normal limits  ACETAMINOPHEN LEVEL - Abnormal; Notable for the following:    Acetaminophen (Tylenol), Serum <10 (*)    All other components within normal limits  SALICYLATE LEVEL  CBC  URINE RAPID DRUG SCREEN, HOSP PERFORMED    EKG  EKG Interpretation None       Radiology No results found.  Procedures Procedures (including critical care time)  Medications Ordered in ED Medications  LORazepam (ATIVAN) tablet 0-4 mg (not administered)    Followed by  LORazepam (ATIVAN) tablet 0-4 mg (not administered)  thiamine (VITAMIN B-1) tablet 100 mg (not administered)    Or  thiamine (B-1) injection 100 mg (not administered)  LORazepam (ATIVAN) injection 0-4 mg (not administered)    Followed by  LORazepam (ATIVAN) injection 0-4 mg (not administered)  ketorolac (TORADOL) 30 MG/ML injection 30 mg (30 mg Intravenous Given 03/04/16 0449)  sodium chloride 0.9 % bolus 1,000 mL (1,000 mLs Intravenous New  Bag/Given 03/04/16 0449)  metoCLOPramide (REGLAN) injection 10 mg (10 mg Intravenous Given 03/04/16 0449)  diphenhydrAMINE (BENADRYL) injection 12.5 mg (12.5 mg Intravenous Given 03/04/16 0449)     Initial Impression / Assessment and Plan / ED Course  I have reviewed the triage vital signs and the nursing notes.  Pertinent labs & imaging results that were available during my care of the patient were reviewed by me and considered in my medical decision making (see chart for details).  Clinical Course    Pt is medically clear. CIWA protocol  is in place. EtOH level 203 but clinically he is acting appropriately. TTS has been consulted and disposition is pending TTS consult.  5:17 AM Pt will need psych eval in the AM. Disposition pending psych eval.  Final Clinical Impressions(s) / ED Diagnoses   Final diagnoses:  Depression  Polysubstance abuse    New Prescriptions New Prescriptions   No medications on file     Anne Ng, Hershal Coria 03/04/16 0517    Charlesetta Shanks, MD 03/11/16 [phone]

## 2016-03-04 NOTE — ED Notes (Signed)
Report given to [place], Therapist, sports. Belongings given.

## 2016-03-04 NOTE — ED Notes (Signed)
Pt to be reevaluated in AM per [facility]

## 2016-03-04 NOTE — ED Notes (Signed)
TTS in progress at this time.  

## 2016-03-04 NOTE — Progress Notes (Signed)
Patient stated that he has been inpatient in the past and would benefit from going back to inpatient care. [facility] currently has no male beds, however, Elmo Putt said to call back on Friday to check on discharges. [facility] is at capacity as well. [facility] hours are M-F, 8a-3p. This Probation officer will pass along information to oncoming staff in the morning to call these facilities to check on bed availability.
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
## 2016-03-04 NOTE — ED Notes (Signed)
Attempted to call report

## 2016-03-04 NOTE — ED Notes (Signed)
Reheated pt's breakfast tray.

## 2016-03-04 NOTE — ED Notes (Signed)
Pt asleep; sitter at bedside.  

## 2016-03-04 NOTE — ED Notes (Signed)
RN attempted to call [facility]; could not get thru.

## 2016-03-04 NOTE — ED Notes (Signed)
Attempted to call [facility].

## 2016-03-04 NOTE — ED Triage Notes (Signed)
Pt states that he is depressed and having SI thoughts, pt does not have a plan. Tearful in triage, Denies HI but states that he is scared he will hurt some one else. Pt states he has been smoking crack/ cocaine and drinking. When asked about AVH states he is not sure but something is going on in his head. Pt continues to cry.

## 2016-03-04 NOTE — Progress Notes (Signed)
Patient arrived here via [facility] transportation. Patient stated that he had been depressed. He stated that he felt that the urge to use crack-cocaine and to drink. He stated that he felt guilty after doing it and decided to get help. He stated that he was living with his fiancee and friend, he stated that he was trying to find a place to stay on his own. He stated that when is not depressed that he drinks 1-2 beers daily and smokes $20.00 worth of rock daily. He stated that he when is depressed that he drinks 4-5 beers daily and will smoke about $40.00-60.00 of rock daily. He stated that he feels depression at a 5, anxiety at a 6, and homelessness at a 9. He denies SI, HI, and AVH. He denies any pain at this time. Skin was assessed with this Probation officer and Heritage manager. Patient has healed scar on lower back, but otherwise skin is intact.   Patient remains safe with constant observation. Patient is offered education, support, and encouragement. Medications to be administered as needed.   Patient is receptive and compliant, will continue to monitor.

## 2016-03-04 NOTE — ED Provider Notes (Signed)
6:53 AM: Care assumes from [place], [place].   [name] [name] is a 50 y.o. male with h/o bipolar disorder, polysubstance abuse presents to ED with depression and suicidal thoughts, no specific plan. Auditory hallucinations and concern for hurting himself. Alcohol abuse and crack abuse. CIWA protocol in place. Psych consult pending. Dispo pending consult.   4:12 PM: Patient has bed at [facility] for further evaluation and treatment of depression and suicidal thoughts. Accepting physician Gust Rung. Patient continues to endorse feeling depressed, although denies SI/HI currently, no V/A hallucinations. No medical complaints at this time. RRR, lungs are CTABL. Abdomen is soft and non-tender, +BS. Pt medically cleared.    Roxanna Mew, PA-C 03/04/16 [address] [place], PA-C 03/04/16 1717    Charlesetta Shanks, MD 03/11/16 1540

## 2016-03-04 NOTE — ED Notes (Signed)
Pt resting; sitter at bedside; breakfast tray delivered.

## 2016-03-04 NOTE — ED Notes (Signed)
PT continues to rest; no signs of distress

## 2016-03-04 NOTE — ED Notes (Signed)
Pt resting with sitter at bedside.

## 2016-03-05 DIAGNOSIS — Z9889 Other specified postprocedural states: Secondary | ICD-10-CM

## 2016-03-05 DIAGNOSIS — Z880 Allergy status to penicillin: Secondary | ICD-10-CM | POA: Diagnosis not present

## 2016-03-05 DIAGNOSIS — Z91018 Allergy to other foods: Secondary | ICD-10-CM

## 2016-03-05 DIAGNOSIS — F1414 Cocaine abuse with cocaine-induced mood disorder: Secondary | ICD-10-CM | POA: Diagnosis not present

## 2016-03-05 DIAGNOSIS — I2510 Atherosclerotic heart disease of native coronary artery without angina pectoris: Secondary | ICD-10-CM | POA: Diagnosis present

## 2016-03-05 DIAGNOSIS — G4700 Insomnia, unspecified: Secondary | ICD-10-CM | POA: Diagnosis present

## 2016-03-05 DIAGNOSIS — F319 Bipolar disorder, unspecified: Secondary | ICD-10-CM | POA: Diagnosis present

## 2016-03-05 DIAGNOSIS — F10230 Alcohol dependence with withdrawal, uncomplicated: Secondary | ICD-10-CM

## 2016-03-05 DIAGNOSIS — K219 Gastro-esophageal reflux disease without esophagitis: Secondary | ICD-10-CM | POA: Diagnosis present

## 2016-03-05 DIAGNOSIS — Z79899 Other long term (current) drug therapy: Secondary | ICD-10-CM | POA: Diagnosis not present

## 2016-03-05 DIAGNOSIS — F1290 Cannabis use, unspecified, uncomplicated: Secondary | ICD-10-CM | POA: Diagnosis not present

## 2016-03-05 DIAGNOSIS — F17210 Nicotine dependence, cigarettes, uncomplicated: Secondary | ICD-10-CM | POA: Diagnosis not present

## 2016-03-05 DIAGNOSIS — R45851 Suicidal ideations: Secondary | ICD-10-CM | POA: Diagnosis present

## 2016-03-05 MED ORDER — LORAZEPAM 1 MG PO TABS
1.0000 mg | ORAL_TABLET | Freq: Two times a day (BID) | ORAL | Status: DC
  Administered 2016-03-07 (×2): 1 mg via ORAL
  Filled 2016-03-05 (×2): qty 1

## 2016-03-05 MED ORDER — LORAZEPAM 1 MG PO TABS
1.0000 mg | ORAL_TABLET | Freq: Four times a day (QID) | ORAL | Status: DC
  Administered 2016-03-05 (×4): 1 mg via ORAL
  Filled 2016-03-05 (×4): qty 1

## 2016-03-05 MED ORDER — ADULT MULTIVITAMIN W/MINERALS CH
1.0000 | ORAL_TABLET | Freq: Every day | ORAL | Status: DC
  Administered 2016-03-05 – 2016-03-08 (×4): 1 via ORAL
  Filled 2016-03-05 (×6): qty 1

## 2016-03-05 MED ORDER — PANTOPRAZOLE SODIUM 40 MG PO TBEC: 40.0000 mg | DELAYED_RELEASE_TABLET | Freq: Every day | ORAL | Status: DC | PRN

## 2016-03-05 MED ORDER — VITAMIN B-1 100 MG PO TABS: 100.0000 mg | ORAL_TABLET | Freq: Every day | ORAL | Status: DC

## 2016-03-05 MED ORDER — LORAZEPAM 1 MG PO TABS
1.0000 mg | ORAL_TABLET | Freq: Three times a day (TID) | ORAL | Status: DC
  Administered 2016-03-06 (×3): 1 mg via ORAL
  Filled 2016-03-05 (×3): qty 1

## 2016-03-05 MED ORDER — THIAMINE HCL 100 MG/ML IJ SOLN: 100.0000 mg | Freq: Every day | INTRAMUSCULAR | Status: AC

## 2016-03-05 MED ORDER — NICOTINE 21 MG/24HR TD PT24
21.0000 mg | MEDICATED_PATCH | Freq: Every day | TRANSDERMAL | Status: DC
  Filled 2016-03-05 (×5): qty 1

## 2016-03-05 MED ORDER — LORAZEPAM 1 MG PO TABS
1.0000 mg | ORAL_TABLET | Freq: Every day | ORAL | Status: DC
  Administered 2016-03-08: 1 mg via ORAL
  Filled 2016-03-05: qty 1

## 2016-03-05 MED ORDER — ENSURE ENLIVE PO LIQD
237.0000 mL | Freq: Two times a day (BID) | ORAL | Status: DC
  Administered 2016-03-05 – 2016-03-08 (×5): 237 mL via ORAL

## 2016-03-05 NOTE — Progress Notes (Signed)
Patient sleeping most of morning. Reports feeling extremely exhausted and wanted to sleep. Medications given as prescribed. Pt currently denies SI, HI, AVH. Reports anxiety, tremors noted from withdrawal. Scheduled ativan given. Pt continues to sleep. Pt under continuous observation except in restroom. Pt remains safe on unit.

## 2016-03-05 NOTE — BHH Suicide Risk Assessment (Signed)
[facility] Admission Suicide Risk Assessment   Nursing information obtained from:    Demographic factors:    Current Mental Status:    Loss Factors:    Historical Factors:    Risk Reduction Factors:     Total Time spent with patient: 30 minutes Principal Problem: Cocaine abuse with cocaine-induced mood disorder ([place]) Diagnosis:   Patient Active Problem List   Diagnosis Date Noted  . Cocaine abuse with cocaine-induced mood disorder ([place]) [F14.14] 03/04/2016  . Alcohol dependence with uncomplicated withdrawal ([place]) [F10.230]   . Bipolar affective disorder, depressed, severe ([place]) [F31.4] 11/22/2015  . Alcohol use disorder, severe, dependence ([place]) [F10.20] 01/18/2015  . Alcohol dependence with alcohol-induced mood disorder ([place]) [F10.24]   . Suicidal ideation [R45.851]   . Acute pancreatitis [K85.90] 03/20/2012  . Polysubstance abuse [F19.10] 03/20/2012  . Cocaine abuse [F14.10] 01/12/2012  . Cannabis abuse [F12.10] 01/12/2012  . Alcohol dependence ([place]) [F10.20] 10/15/2011  . Abnormal ECG [R94.31]   . Chest pain [R07.2]   . CAD in native artery [I25.10]   . GERD (gastroesophageal reflux disease) [K21.9]   . Tobacco abuse [Z72.0]   . History of cocaine abuse [F14.10]   . History of ETOH abuse [F10.10]   . Bradycardia [R00.1]   . Syncope [R55]    Subjective Data: c/o ongoing depression in context of substance use disorders. No current SI/HI  Continued Clinical Symptoms:  Alcohol Use Disorder Identification Test Final Score (AUDIT): 27 The "Alcohol Use Disorders Identification Test", Guidelines for Use in Primary Care, Second Edition.  [facility]). Score between 0-7:  no or low risk or alcohol related problems. Score between 8-15:  moderate risk of alcohol related problems. Score between 16-19:  high risk of alcohol related problems. Score 20 or above:  warrants further diagnostic evaluation for alcohol dependence and treatment.   CLINICAL FACTORS:   Alcohol/Substance Abuse/Dependencies   Musculoskeletal: Strength & Muscle Tone: not tested Gait & Station: in bed Patient leans: N/A  Psychiatric Specialty Exam: Physical Exam  Constitutional: He appears well-developed.     Review of Systems  Gastrointestinal: Positive for heartburn.  All other systems reviewed and are negative.   Blood pressure 104/66, pulse 63, temperature 97.9 F (36.6 C), temperature source Oral, resp. rate 18, height 5\' 4"  (1.626 m), weight 49.9 kg (110 lb), SpO2 100 %.Body mass index is 18.88 kg/m.  General Appearance: Casual  Eye Contact:  Good  Speech:  Clear and Coherent  Volume:  Decreased  Mood:  Depressed  Affect:  Non-Congruent  Thought Process:  Coherent  Orientation:  Other:  to person  Thought Content:  Negative  Suicidal Thoughts:  No  Homicidal Thoughts:  No  Memory:  Negative  Judgement:  Impaired  Insight:  Shallow  Psychomotor Activity:  Decreased  Concentration:  Concentration: Fair and Attention Span: Fair  Recall:  Poor  Fund of Knowledge:  Good  Language:  Good  Akathisia:  No  Handed:  Right  AIMS (if indicated):     Assets:  Resilience  ADL's:  Intact  Cognition:  Impaired,  Mild  Sleep:         COGNITIVE FEATURES THAT CONTRIBUTE TO RISK:  Loss of executive function    SUICIDE RISK:   Moderate:  Frequent suicidal ideation with limited intensity, and duration, some specificity in terms of plans, no associated intent, good self-control, limited dysphoria/symptomatology, some risk factors present, and identifiable protective factors, including available and accessible social support.   PLAN OF CARE: see  PAA  I certify that inpatient services furnished can reasonably be expected to improve the patient's condition.  Linard Millers, MD 03/05/2016, 3:32 PM

## 2016-03-05 NOTE — BHH Group Notes (Signed)
[place] LCSW Group Therapy 03/05/2016 1:15 PM Type of Therapy: Group Therapy Participation Level: Minimal  Participation Quality: Limited  Affect: Depressed and Flat  Cognitive: Alert and Oriented  Insight: Developing/Improving and Engaged  Engagement in Therapy: Developing/Improving and Engaged  Modes of Intervention: Clarification, Confrontation, Discussion, Education, Exploration, Limit-setting, Orientation, Problem-solving, Rapport Building, Art therapist, Socialization and Support  Summary of Progress/Problems: The topic for today was feelings about relapse. Pt discussed what relapse prevention is to them and identified triggers that they are on the path to relapse. Pt processed their feeling towards relapse and was able to relate to peers. Pt discussed coping skills that can be used for relapse prevention. Patient participated minimally in discussion and was observed sleeping during discussion.    Tilden Fossa, MSW, [place] Clinical Social Worker [facility] [phone]
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
## 2016-03-05 NOTE — Progress Notes (Signed)
[place] Observation Crisis Plan  Reason for Crisis Plan:  Substance Abuse   Plan of Care:  Referral for Substance Abuse  Family Support:      Current Living Environment:  Living Arrangements: Other (Comment) (friend and fiancee)  Insurance:   Hospital Account    Name Acct ID Class Status Primary Coverage   [name] [name], [name] [name] [id number] [place]        Guarantor Account (for Hospital Account [id number])    Name Relation to Pt Service Area Active? Acct Type   [name] [name] Self [facility] Yes Behavioral Health   Address Phone       homeless [place], [place] [address] [phone])          Coverage Information (for Hospital Account [id number])    F/O Payor/Plan Precert #   [facility] MEDICAID/[place] MEDICAID    Subscriber Subscriber #   [name] [name], [name] [name] [id number]   Address Phone   [address] [place] [address] [phone]      Legal Guardian:     Primary Care Provider:  [name], MD  Current Outpatient Providers:  none  Psychiatrist:     Counselor/Therapist:     Compliant with Medications:  Yes  Additional Information:   [name] [name] 9/29/20174:36 AM

## 2016-03-05 NOTE — Progress Notes (Signed)
Nursing Progress Note: 7p-7a D: Pt currently presents with a flat affect and an anxious/depressed/shy behavior. Pt reports to writer that their goal is to "find ways to cope with depression." Pt states "he has been restless at night." Pt reports off and on sleep with current medication regimen.   A: Pt provided with medications per providers orders. Pt's labs and vitals were monitored throughout the night. Pt supported emotionally and encouraged to express concerns and questions. Pt educated on medications.  R: Pt's safety ensured with 15 minute and environmental checks. Pt currently denies SI/HI/Self Harm and A/V hallucinations. Pt verbally agrees to seek staff if SI/HI or A/VH occurs and to consult with staff before acting on any harmful thoughts. Will continue POC.

## 2016-03-05 NOTE — Progress Notes (Signed)
Patient ID: [name] [name], male   DOB: 10-17-1965, 50 y.o.   MRN: [id number] D: Patient is eating his lunch.  He is in no acute distress.  His withdrawal symptoms are minimal. He is a little tremulous and anxious.  Patient denies any thoughts of self harm.  He denies HI/AVH.  Patient not able to get into [facility] or [facility] due to no bed availability.  Patient is a possible transfer to inpatient unit for further treatment.   A: Continue to monitor medication management and MD orders.  Continuous observation of patient OBS outpatient.  Offer support and encouragement as needed. R: Patient is receptive to staff; his behavior is appropriate.

## 2016-03-05 NOTE — H&P (Signed)
Psychiatric Admission Assessment Adult  Patient Identification: [name] [name] MRN:  [id number] Date of Evaluation:  03/05/2016 Chief Complaint:  substance induced mood disorder Principal Diagnosis: Cocaine abuse with cocaine-induced mood disorder ([place]) Diagnosis:   Patient Active Problem List   Diagnosis Date Noted  . Cocaine abuse with cocaine-induced mood disorder ([place]) [F14.14] 03/04/2016  . Alcohol dependence with uncomplicated withdrawal ([place]) [F10.230]   . Bipolar affective disorder, depressed, severe ([place]) [F31.4] 11/22/2015  . Alcohol use disorder, severe, dependence ([place]) [F10.20] 01/18/2015  . Alcohol dependence with alcohol-induced mood disorder ([place]) [F10.24]   . Suicidal ideation [R45.851]   . Acute pancreatitis [K85.90] 03/20/2012  . Polysubstance abuse [F19.10] 03/20/2012  . Cocaine abuse [F14.10] 01/12/2012  . Cannabis abuse [F12.10] 01/12/2012  . Alcohol dependence ([place]) [F10.20] 10/15/2011  . Abnormal ECG [R94.31]   . Chest pain [R07.2]   . CAD in native artery [I25.10]   . GERD (gastroesophageal reflux disease) [K21.9]   . Tobacco abuse [Z72.0]   . History of cocaine abuse [F14.10]   . History of ETOH abuse [F10.10]   . Bradycardia [R00.1]   . Syncope [R55]    History of Present Illness: [name] [name] presents for detox from alcohol. He also reports "still got a lot of depression" when asked what depression means to him he says "I want to explain it but I can't." He clarifies that he feels like his mood is "mean" and that he has little tolerance for others and becomes more irritable until he can distract himself with something such as alcohol or cocaine. He feels he has been suffering from depression since his mid 28s and first sought treatment at 13. Most recently he has been on Seroquel and Neurontin but states he is not sure they are helpful. He was asked about a number of other possible medications but cannot recall any others that he is taken. He states that his  goal for what medications could help him with would be to keep him from blowing things out of proportion. He does report that he found pot Prilosec helpful and currently has some heartburn and would like to resume it. He states he "used to have" some auditory hallucinations but is not currently having any. He denies current mania but possibly endorses some manic symptoms in the past. He denies any trauma history. Associated Signs/Symptoms: Depression Symptoms:  depressed mood, anhedonia, (Hypo) Manic Symptoms:  denies Anxiety Symptoms:  denies Psychotic Symptoms:  none now PTSD Symptoms: Negative Total Time spent with patient: 30 minutes  Past Psychiatric History: [name] [name] appears to been last here in June 2017 at that time he detoxed from alcohol over a 4 day stay and was referred to [facility] Discharge psychiatric medicines were Seroquel 100 mg by mouth daily at bedtime and gabapentin 100 mg by mouth twice a day. He reports that he was in special education in school was only able to complete the 10th grade. He is on SSI disability payments for heart disease and learning disabled per his report  Is the patient at risk to self? Yes.    Has the patient been a risk to self in the past 6 months? Yes.    Has the patient been a risk to self within the distant past? Yes.    Is the patient a risk to others? No.  Has the patient been a risk to others in the past 6 months? No.  Has the patient been a risk to others within the distant past? No.  Prior Inpatient Therapy:  see above See above Prior Outpatient Therapy:    Alcohol Screening: 1. How often do you have a drink containing alcohol?: 2 to 3 times a week 2. How many drinks containing alcohol do you have on a typical day when you are drinking?: 5 or 6 3. How often do you have six or more drinks on one occasion?: Weekly Preliminary Score: 5 4. How often during the last year have you found that you were not able to stop drinking once you had  started?: Weekly 5. How often during the last year have you failed to do what was normally expected from you becasue of drinking?: Weekly 6. How often during the last year have you needed a first drink in the morning to get yourself going after a heavy drinking session?: Weekly 7. How often during the last year have you had a feeling of guilt of remorse after drinking?: Weekly 8. How often during the last year have you been unable to remember what happened the night before because you had been drinking?: Weekly 9. Have you or someone else been injured as a result of your drinking?: No 10. Has a relative or friend or a doctor or another health worker been concerned about your drinking or suggested you cut down?: Yes, during the last year Alcohol Use Disorder Identification Test Final Score (AUDIT): 27 Brief Intervention: Yes Substance Abuse History in the last 12 months:  Yes.   Consequences of Substance Abuse: Medical Consequences:  hx pancreatitis, pt reports hx liver disease Previous Psychotropic Medications: Yes  Psychological Evaluations: No  Past Medical History:  Past Medical History:  Diagnosis Date  . Abnormal ECG    a. early repolarization  . Arthritis    "my whole left side" (05/03/2014)  . Bipolar disorder ([place])   . Bradycardia    a. asymptomatic  . CAD in native artery    a. Nonobstructive cath 11/2007;  b. Presented with ST elevation - Nonobstructive cath 08/2011  . Chest pain, mid sternal   . Coronary artery disease   . GERD (gastroesophageal reflux disease)   . History of cocaine abuse    a. quit ? 2009  . History of ETOH abuse    a. drinks 2 "40's" / wk  . Marijuana abuse    a. uses ~ 1x /wk or less  . Pneumonia   . Stomach ulcer   . Syncope    a. 12/2010 - presumed to be vasovagal  . Tobacco abuse     Past Surgical History:  Procedure Laterality Date  . CARDIAC CATHETERIZATION  2009; 08/2011   [name] [name] 08/06/2011  . LEFT HEART CATHETERIZATION WITH CORONARY  ANGIOGRAM N/A 08/16/2011   Procedure: LEFT HEART CATHETERIZATION WITH CORONARY ANGIOGRAM;  Surgeon: [name], MD;  Location: [facility] CATH LAB;  Service: Cardiovascular;  Laterality: N/A;   Family History: History reviewed. No pertinent family history. Family Psychiatric  History: none known Tobacco Screening: Have you used any form of tobacco in the last 30 days? (Cigarettes, Smokeless Tobacco, Cigars, and/or Pipes): Yes Tobacco use, Select all that apply: 5 or more cigarettes per day Are you interested in Tobacco Cessation Medications?: Yes, will notify MD for an order Counseled patient on smoking cessation including recognizing danger situations, developing coping skills and basic information about quitting provided: Yes Social History:  History  Alcohol Use  . 28.8 oz/week  . 48 Cans of beer per week    Comment: none for 2-3 weeks  History  Drug Use  . Types: Marijuana, Cocaine    Comment: last use Jun 24, 2015    Additional Social History:States that he  he does not have a stable place to live currently going from "place to place." He is on disability but in the past has worked various laboring jobs. 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 He denies any current legal issues. He states he is single but has 5 children. He reports he does have some contact with them.                           Allergies:   Allergies  Allergen Reactions  . Aspirin     Aggravates ulcer. "Causes chest pain."  . Pepperoni [Pickled Meat] Other (See Comments)    aggrevates ulcer  . Tomato Other (See Comments)    Foods with tomato sauce aggrevate ulcers  . Tylenol [Acetaminophen]     Pt reports hx of ulcers   Lab Results:  Results for orders placed or performed during the hospital encounter of 03/04/16 (from the past 48 hour(s))  Comprehensive metabolic panel     Status: Abnormal   Collection Time: 03/04/16  2:33 AM  Result Value Ref  Range   Sodium 139 135 - 145 mmol/L   Potassium 3.6 3.5 - 5.1 mmol/L   Chloride 107 101 - 111 mmol/L   CO2 21 (L) 22 - 32 mmol/L   Glucose, Bld 91 65 - 99 mg/dL   BUN <5 (L) 6 - 20 mg/dL   Creatinine, Ser 1.13 0.61 - 1.24 mg/dL   Calcium 9.4 8.9 - 10.3 mg/dL   Total Protein 7.9 6.5 - 8.1 g/dL   Albumin 4.6 3.5 - 5.0 g/dL   AST 20 15 - 41 U/L   ALT 12 (L) 17 - 63 U/L   Alkaline Phosphatase 64 38 - 126 U/L   Total Bilirubin 0.7 0.3 - 1.2 mg/dL   GFR calc non Af Amer >60 >60 mL/min   GFR calc Af Amer >60 >60 mL/min    Comment: (NOTE) The eGFR has been calculated using the CKD EPI equation. This calculation has not been validated in all clinical situations. eGFR's persistently <60 mL/min signify possible Chronic Kidney Disease.    Anion gap 11 5 - 15  Ethanol     Status: Abnormal   Collection Time: 03/04/16  2:33 AM  Result Value Ref Range   Alcohol, Ethyl (B) 203 (H) <5 mg/dL    Comment:        LOWEST DETECTABLE LIMIT FOR SERUM ALCOHOL IS 5 mg/dL FOR MEDICAL PURPOSES ONLY   Salicylate level     Status: None   Collection Time: 03/04/16  2:33 AM  Result Value Ref Range   Salicylate Lvl <9.4 2.8 - 30.0 mg/dL  Acetaminophen level     Status: Abnormal   Collection Time: 03/04/16  2:33 AM  Result Value Ref Range   Acetaminophen (Tylenol), Serum <10 (L) 10 - 30 ug/mL    Comment:        THERAPEUTIC CONCENTRATIONS VARY SIGNIFICANTLY. A RANGE OF 10-30 ug/mL MAY BE AN EFFECTIVE CONCENTRATION FOR MANY PATIENTS. HOWEVER, SOME ARE BEST TREATED AT CONCENTRATIONS OUTSIDE THIS RANGE. ACETAMINOPHEN CONCENTRATIONS >150 ug/mL AT 4 HOURS AFTER INGESTION AND >50 ug/mL AT 12 HOURS AFTER INGESTION ARE OFTEN ASSOCIATED WITH TOXIC REACTIONS.   cbc     Status: None   Collection Time:  03/04/16  2:33 AM  Result Value Ref Range   WBC 6.1 4.0 - 10.5 K/uL   RBC 4.96 4.22 - 5.81 MIL/uL   Hemoglobin 15.7 13.0 - 17.0 g/dL   HCT 44.7 39.0 - 52.0 %   MCV 90.1 78.0 - 100.0 fL   MCH 31.7 26.0 -  34.0 pg   MCHC 35.1 30.0 - 36.0 g/dL   RDW 13.9 11.5 - 15.5 %   Platelets 247 150 - 400 K/uL  Rapid urine drug screen (hospital performed)     Status: Abnormal   Collection Time: 03/04/16  4:20 AM  Result Value Ref Range   Opiates NONE DETECTED NONE DETECTED   Cocaine POSITIVE (A) NONE DETECTED   Benzodiazepines NONE DETECTED NONE DETECTED   Amphetamines NONE DETECTED NONE DETECTED   Tetrahydrocannabinol NONE DETECTED NONE DETECTED   Barbiturates NONE DETECTED NONE DETECTED    Comment:        DRUG SCREEN FOR MEDICAL PURPOSES ONLY.  IF CONFIRMATION IS NEEDED FOR ANY PURPOSE, NOTIFY LAB WITHIN 5 DAYS.        LOWEST DETECTABLE LIMITS FOR URINE DRUG SCREEN Drug Class       Cutoff (ng/mL) Amphetamine      1000 Barbiturate      200 Benzodiazepine   664 Tricyclics       403 Opiates          300 Cocaine          300 THC              50     Blood Alcohol level:  Lab Results  Component Value Date   ETH 203 (H) 03/04/2016   ETH 129 (H) 47/42/5956    Metabolic Disorder Labs:  Lab Results  Component Value Date   HGBA1C 4.5 05/03/2014   MPG 82 05/03/2014   MPG 100 08/16/2011   No results found for: PROLACTIN Lab Results  Component Value Date   CHOL 159 05/03/2014   TRIG 97 05/03/2014   HDL 98 05/03/2014   CHOLHDL 1.6 05/03/2014   VLDL 19 05/03/2014   LDLCALC 42 05/03/2014   LDLCALC 75 03/20/2012    Current Medications: Current Facility-Administered Medications  Medication Dose Route Frequency Provider Last Rate Last Dose  . alum & mag hydroxide-simeth (MAALOX/MYLANTA) 200-200-20 MG/5ML suspension 30 mL  30 mL Oral Q4H PRN [name], NP      . feeding supplement (ENSURE ENLIVE) (ENSURE ENLIVE) liquid 237 mL  237 mL Oral BID BM [name], NP   237 mL at 03/05/16 1400  . LORazepam (ATIVAN) tablet 1 mg  1 mg Oral QID [name], NP   1 mg at 03/05/16 1205   Followed by  . [START ON 03/06/2016] LORazepam (ATIVAN) tablet 1 mg  1 mg Oral TID [name], NP        Followed by  . [START ON 03/07/2016] LORazepam (ATIVAN) tablet 1 mg  1 mg Oral BID [name], NP       Followed by  . [START ON 03/08/2016] LORazepam (ATIVAN) tablet 1 mg  1 mg Oral Daily [name], NP      . magnesium hydroxide (MILK OF MAGNESIA) suspension 30 mL  30 mL Oral Daily PRN [name], NP      . multivitamin with minerals tablet 1 tablet  1 tablet Oral Daily [name], NP   1 tablet at 03/05/16 [phone]  . nicotine (NICODERM CQ -  dosed in mg/24 hours) patch 21 mg  21 mg Transdermal Daily [name], NP      . QUEtiapine (SEROQUEL) tablet 100 mg  100 mg Oral QHS [name], NP   100 mg at 03/05/16 0002  . thiamine (VITAMIN B-1) tablet 100 mg  100 mg Oral Daily [name], NP   100 mg at 03/05/16 7893   PTA Medications: Prescriptions Prior to Admission  Medication Sig Dispense Refill Last Dose  . omeprazole (PRILOSEC) 20 MG capsule Take 1 capsule (20 mg total) by mouth 2 (two) times daily before a meal. 28 capsule 0 Past Month at Unknown time  . QUEtiapine (SEROQUEL) 50 MG tablet Take 2 tablets (100 mg total) by mouth at bedtime. (Patient not taking: Reported on 12/22/2015) 60 tablet 0 Not Taking at Unknown time    Musculoskeletal: Strength & Muscle Tone: Not tested Gait & Station: In bed Patient leans: N/A  Psychiatric Specialty Exam: Physical Exam he is a rather slender appearing African-American male who is resting in bed and appears in no apparent distress he is moderately groomed and is noticeable that he is missing his right front tooth.   ROS noncontributory   Blood pressure 104/66, pulse 63, temperature 97.9 F (36.6 C), temperature source Oral, resp. rate 18, height _0  (1.626 m), weight 49.9 kg (110 lb), SpO2 100 %.Body mass index is 18.88 kg/m.  General Appearance: Casual  Eye Contact:  Good  Speech:  Clear and Coherent  Volume:  Decreased  Mood:  Depressed  Affect:  Non-Congruent  Thought Process:  Coherent  Orientation:   Other:  [name] [name] gives the date as the 18th and shakes his head when asked what the month and the year are. 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 Content:  Negative  Suicidal Thoughts:  No  Homicidal Thoughts:  No  Memory:  Immediate;   Poor  Judgement:  Impaired  Insight:  Shallow  Psychomotor Activity:  Decreased  Concentration:  Concentration: Fair and Attention Span: Fair  Recall:  Poor  Fund of Knowledge:  Fair  Language:  Good  Akathisia:  No  Handed:  Right  AIMS (if indicated):     Assets:  Resilience  ADL's:  Intact  Cognition:  Impaired,  Mild  Sleep:       Treatment Plan Summary: Daily contact with patient to assess and evaluate symptoms and progress in treatment, Medication management and will assess for possible change or addition of medication for mood next week. Monitor medically supervised detox. 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 Currently oriented to person and may be experiencing some delirium--will order IM thiamine and we will continue to monitor  Observation Level/Precautions:  15 minute checks  Laboratory:  See labs  Psychotherapy:  gourp 1;1 milieu  Medications:  See [facility]  Consultations:  SW  Discharge Concerns:    Estimated LOS: 3-6 days  Other:     Physician Treatment Plan for Primary Diagnosis: Cocaine abuse with cocaine-induced mood disorder ([place]) Long Term Goal(s): Improvement in symptoms so as ready for discharge  Short Term Goals: Ability to maintain clinical measurements within normal limits will improve  Physician Treatment Plan for Secondary Diagnosis: Principal Problem:   Cocaine abuse with cocaine-induced mood disorder ([place]) Active Problems:   Alcohol dependence ([place])   Bipolar affective disorder, depressed, severe (  [place])   Alcohol dependence with uncomplicated withdrawal  ([place])  Long Term Goal(s): Improvement in symptoms so as ready for discharge  Short Term Goals: Ability to disclose and discuss suicidal ideas, Ability to identify and develop effective coping behaviors will improve and Ability to identify triggers associated with substance abuse/mental health issues will improve  I certify that inpatient services furnished can reasonably be expected to improve the patient's condition.    Linard Millers, MD 9/29/20173:20 PM

## 2016-03-06 DIAGNOSIS — F1414 Cocaine abuse with cocaine-induced mood disorder: Secondary | ICD-10-CM

## 2016-03-06 DIAGNOSIS — F17210 Nicotine dependence, cigarettes, uncomplicated: Secondary | ICD-10-CM

## 2016-03-06 DIAGNOSIS — Z79899 Other long term (current) drug therapy: Secondary | ICD-10-CM

## 2016-03-06 DIAGNOSIS — F1290 Cannabis use, unspecified, uncomplicated: Secondary | ICD-10-CM

## 2016-03-06 MED ORDER — SERTRALINE HCL 25 MG PO TABS
25.0000 mg | ORAL_TABLET | Freq: Every day | ORAL | Status: DC
  Administered 2016-03-06 – 2016-03-08 (×3): 25 mg via ORAL
  Filled 2016-03-06 (×5): qty 1

## 2016-03-06 NOTE — Progress Notes (Signed)
[facility] MD Progress Note  03/06/2016 3:50 PM [name] [name]  MRN:  [id number] Subjective:   Patient seen, chart reviewed and case discussed with nursing staff.   Patient states that he feels depressed and not feel well, although it has been slightly better since admission. He reports significant concern about housing, and would like to know about any options. He denies SI/HI. He denies AH/VH.   Principal Problem: Cocaine abuse with cocaine-induced mood disorder ([place]) Diagnosis:   Patient Active Problem List   Diagnosis Date Noted  . Cocaine abuse with cocaine-induced mood disorder ([place]) [F14.14] 03/04/2016  . Alcohol dependence with uncomplicated withdrawal ([place]) [F10.230]   . Bipolar affective disorder, depressed, severe ([place]) [F31.4] 11/22/2015  . Alcohol use disorder, severe, dependence ([place]) [F10.20] 01/18/2015  . Alcohol dependence with alcohol-induced mood disorder ([place]) [F10.24]   . Suicidal ideation [R45.851]   . Acute pancreatitis [K85.90] 03/20/2012  . Polysubstance abuse [F19.10] 03/20/2012  . Cocaine abuse [F14.10] 01/12/2012  . Cannabis abuse [F12.10] 01/12/2012  . Alcohol dependence ([place]) [F10.20] 10/15/2011  . Abnormal ECG [R94.31]   . Chest pain [R07.2]   . CAD in native artery [I25.10]   . GERD (gastroesophageal reflux disease) [K21.9]   . Tobacco abuse [Z72.0]   . History of cocaine abuse [F14.10]   . History of ETOH abuse [F10.10]   . Bradycardia [R00.1]   . Syncope [R55]    Total Time spent with patient: 15 minutes  Past Psychiatric History: see HPI  Past Medical History:  Past Medical History:  Diagnosis Date  . Abnormal ECG    a. early repolarization  . Arthritis    "my whole left side" (05/03/2014)  . Bipolar disorder ([place])   . Bradycardia    a. asymptomatic  . CAD in native artery    a. Nonobstructive cath 11/2007;  b. Presented with ST elevation - Nonobstructive cath 08/2011  . Chest pain, mid sternal   . Coronary artery disease   . GERD  (gastroesophageal reflux disease)   . History of cocaine abuse    a. quit ? 2009  . History of ETOH abuse    a. drinks 2 "40's" / wk  . Marijuana abuse    a. uses ~ 1x /wk or less  . Pneumonia   . Stomach ulcer   . Syncope    a. 12/2010 - presumed to be vasovagal  . Tobacco abuse     Past Surgical History:  Procedure Laterality Date  . CARDIAC CATHETERIZATION  2009; 08/2011   [name] 08/06/2011  . LEFT HEART CATHETERIZATION WITH CORONARY ANGIOGRAM N/A 08/16/2011   Procedure: LEFT HEART CATHETERIZATION WITH CORONARY ANGIOGRAM;  Surgeon: [name], MD;  Location: [facility] CATH LAB;  Service: Cardiovascular;  Laterality: N/A;   Family History: History reviewed. No pertinent family history. Family Psychiatric  History: see HPI Social History:  History  Alcohol Use  . 28.8 oz/week  . 48 Cans of beer per week    Comment: none for 2-3 weeks     History  Drug Use  . Types: Marijuana, Cocaine    Comment: last use Jun 24, 2015    Social History   Social History  . Marital status: Single    Spouse name: N/A  . Number of children: N/A  . Years of education: N/A   Social History Main Topics  . Smoking status: Current Some Day Smoker    Packs/day: 0.50    Years: 20.00    Types: Cigarettes  Last attempt to quit: 06/24/2015  . Smokeless tobacco: Never Used  . Alcohol use 28.8 oz/week    48 Cans of beer per week     Comment: none for 2-3 weeks  . Drug use:     Types: Marijuana, Cocaine     Comment: last use Jun 24, 2015  . Sexual activity: Yes   Other Topics Concern  . None   Social History Narrative   Lives with his daughter currently and receives SSI.    Additional Social History:                         Sleep: Fair  Appetite:  Fair  Current Medications: Current Facility-Administered Medications  Medication Dose Route Frequency Provider Last Rate Last Dose  . alum & mag hydroxide-simeth (MAALOX/MYLANTA) 200-200-20 MG/5ML suspension 30 mL  30 mL Oral  Q4H PRN [name], NP      . feeding supplement (ENSURE ENLIVE) (ENSURE ENLIVE) liquid 237 mL  237 mL Oral BID BM [name], NP   237 mL at 03/05/16 1400  . LORazepam (ATIVAN) tablet 1 mg  1 mg Oral TID [name], NP   1 mg at 03/06/16 1132   Followed by  . [START ON 03/07/2016] LORazepam (ATIVAN) tablet 1 mg  1 mg Oral BID [name], NP       Followed by  . [START ON 03/08/2016] LORazepam (ATIVAN) tablet 1 mg  1 mg Oral Daily [name], NP      . magnesium hydroxide (MILK OF MAGNESIA) suspension 30 mL  30 mL Oral Daily PRN [name], NP      . multivitamin with minerals tablet 1 tablet  1 tablet Oral Daily [name], NP   1 tablet at 03/06/16 [id number]  . nicotine (NICODERM CQ - dosed in mg/24 hours) patch 21 mg  21 mg Transdermal Daily [name], NP      . pantoprazole (PROTONIX) EC tablet 40 mg  40 mg Oral Daily PRN Linard Millers, MD      . QUEtiapine (SEROQUEL) tablet 100 mg  100 mg Oral QHS [name], NP   100 mg at 03/05/16 2155  . thiamine (B-1) injection 100 mg  100 mg Intramuscular Daily Linard Millers, MD      . thiamine (VITAMIN B-1) tablet 100 mg  100 mg Oral Daily [name], NP   100 mg at 03/06/16 [id number]    Lab Results: No results found for this or any previous visit (from the past 44 hour(s)).  Blood Alcohol level:  Lab Results  Component Value Date   ETH 203 (H) 03/04/2016   ETH 129 (H) [id number]    Metabolic Disorder Labs: Lab Results  Component Value Date   HGBA1C 4.5 05/03/2014   MPG 82 05/03/2014   MPG 100 08/16/2011   No results found for: PROLACTIN Lab Results  Component Value Date   CHOL 159 05/03/2014   TRIG 97 05/03/2014   HDL 98 05/03/2014   CHOLHDL 1.6 05/03/2014   VLDL 19 05/03/2014   LDLCALC 42 05/03/2014   LDLCALC 75 03/20/2012    Physical Findings: AIMS:  , ,  ,  ,    CIWA:  CIWA-Ar Total: 0 COWS:  COWS Total Score: 2  Musculoskeletal: Strength & Muscle Tone: within normal  limits Gait & Station: normal Patient leans: N/A  Psychiatric Specialty Exam: Physical Exam  Review  of Systems  Constitutional: Positive for diaphoresis and malaise/fatigue.  Eyes: Negative for blurred vision.  Cardiovascular: Negative for chest pain and palpitations.  Neurological: Positive for dizziness.  Psychiatric/Behavioral: Positive for depression and substance abuse. Negative for hallucinations and suicidal ideas. The patient is nervous/anxious and has insomnia.     Blood pressure 97/66, pulse 69, temperature 97.8 F (36.6 C), temperature source Oral, resp. rate 16, height 5\' 4"  (1.626 m), weight 110 lb (49.9 kg), SpO2 100 %.Body mass index is 18.88 kg/m.  General Appearance: Disheveled  Eye Contact:  Fair  Speech:  Clear and Coherent  Volume:  Normal  Mood:  Depressed  Affect:  Depressed  Thought Process:  Coherent  Orientation:  Full (Time, Place, and Person)  Thought Content:  Logical  Suicidal Thoughts:  No  Homicidal Thoughts:  No  Memory:  Immediate;   Fair Recent;   Fair Remote;   Fair  Judgement:  Fair  Insight:  Present  Psychomotor Activity:  Decreased  Concentration:  Concentration: Fair and Attention Span: Fair  Recall:  [employer] of Knowledge:  Fair  Language:  Good  Akathisia:  No  Handed:  Right  AIMS (if indicated):     Assets:  Communication Skills Desire for Improvement  ADL's:  Intact  Cognition:  WNL  Sleep:  Number of Hours: 94   Assessment 50 year old male with cocaine use, alcohol use, bipolar disorder per chart, presented for detox.   # Alcohol withdrawal Patient has been tolerating alcohol detox without significant symptoms. Will continue current regimen.   # Substance induced mood disorder # bipolar disorder per chart Patient endorses neurovegetative symptoms. Patient does have history of bipolar disorder per chart, although he denies any significant manic symptoms in the past. Will try sertraline from lower dose; will monitor  any manic symptoms.   Plans - Continue ativan protocol - Continue Seroquel 100 mg qhs - Start sertraline 25 mg daily - Medication management to reduce current symptoms to base line and improve the patient's overall level of functioning. - Monitor for the adverse effect of the medications and anger outbursts - Continue 15 minutes observation for safety concerns - Encouraged to participate in milieu therapy and group therapy counseling sessions and also work with coping skills -  Develop treatment plan to decrease risk of relapse upon discharge and to reduce the need for readmission. -  Psycho-social education regarding relapse prevention and self care. - Health care follow up as needed for medical problems. - Restart home medications where appropriate.   Treatment Plan Summary: Daily contact with patient to assess and evaluate symptoms and progress in treatment  Norman Clay, MD 03/06/2016, 3:50 PM

## 2016-03-06 NOTE — Consult Note (Signed)
[place] Psychiatry Consult   Reason for Consult:  Cocaine abuse with suicidal ideations, plan Referring Physician:  EDP Patient Identification: [name] [name] MRN:  [id number] Principal Diagnosis: Cocaine abuse with cocaine-induced mood disorder [facility]) Diagnosis:   Patient Active Problem List   Diagnosis Date Noted  . Bipolar affective disorder, depressed, severe ([place]) [F31.4] 11/22/2015    Priority: High  . Alcohol use disorder, severe, dependence ([place]) [F10.20] 01/18/2015    Priority: High  . Alcohol dependence ([place]) [F10.20] 10/15/2011    Priority: High  . Cocaine abuse with cocaine-induced mood disorder ([place]) [F14.14] 03/04/2016  . Alcohol dependence with uncomplicated withdrawal ([place]) [F10.230]   . Alcohol dependence with alcohol-induced mood disorder ([place]) [F10.24]   . Suicidal ideation [R45.851]   . Acute pancreatitis [K85.90] 03/20/2012  . Polysubstance abuse [F19.10] 03/20/2012  . Cocaine abuse [F14.10] 01/12/2012  . Cannabis abuse [F12.10] 01/12/2012  . Abnormal ECG [R94.31]   . Chest pain [R07.2]   . CAD in native artery [I25.10]   . GERD (gastroesophageal reflux disease) [K21.9]   . Tobacco abuse [Z72.0]   . History of cocaine abuse [F14.10]   . History of ETOH abuse [F10.10]   . Bradycardia [R00.1]   . Syncope [R55]     Total Time spent with patient: 30 minutes  Subjective:   [name] [name] is a 50 y.o. male patient admitted with suicide plan.  HPI:  Patient was admitted to the Observation Unit for alcohol detox, depression, and suicidal ideations.  However, during the withdrawal process, he began to feel worse and started thinking of ways he could kill himself.  Feeling hopeless, helpless, and worthless.  Transferred to inpatient unit  Past Psychiatric History: depression, substance abuse  Risk to Self: Is patient at risk for suicide?: No Risk to Others:  none Prior Inpatient Therapy:  multiple [facility] admission Prior Outpatient Therapy:  none  currently  Past Medical History:  Past Medical History:  Diagnosis Date  . Abnormal ECG    a. early repolarization  . Arthritis    "my whole left side" (05/03/2014)  . Bipolar disorder ([place])   . Bradycardia    a. asymptomatic  . CAD in native artery    a. Nonobstructive cath 11/2007;  b. Presented with ST elevation - Nonobstructive cath 08/2011  . Chest pain, mid sternal   . Coronary artery disease   . GERD (gastroesophageal reflux disease)   . History of cocaine abuse    a. quit ? 2009  . History of ETOH abuse    a. drinks 2 "40's" / wk  . Marijuana abuse    a. uses ~ 1x /wk or less  . Pneumonia   . Stomach ulcer   . Syncope    a. 12/2010 - presumed to be vasovagal  . Tobacco abuse     Past Surgical History:  Procedure Laterality Date  . CARDIAC CATHETERIZATION  2009; 08/2011   [name] 08/06/2011  . LEFT HEART CATHETERIZATION WITH CORONARY ANGIOGRAM N/A 08/16/2011   Procedure: LEFT HEART CATHETERIZATION WITH CORONARY ANGIOGRAM;  Surgeon: [name], MD;  Location: [facility] CATH LAB;  Service: Cardiovascular;  Laterality: N/A;   Family History: History reviewed. No pertinent family history. Family Psychiatric  History: none Social History:  History  Alcohol Use  . 28.8 oz/week  . 48 Cans of beer per week    Comment: none for 2-3 weeks     History  Drug Use  . Types: Marijuana, Cocaine    Comment: last  use Jun 24, 2015    Social History   Social History  . Marital status: Single    Spouse name: N/A  . Number of children: N/A  . Years of education: N/A   Social History Main Topics  . Smoking status: Current Some Day Smoker    Packs/day: 0.50    Years: 20.00    Types: Cigarettes    Last attempt to quit: 06/24/2015  . Smokeless tobacco: Never Used  . Alcohol use 28.8 oz/week    48 Cans of beer per week     Comment: none for 2-3 weeks  . Drug use:     Types: Marijuana, Cocaine     Comment: last use Jun 24, 2015  . Sexual activity: Yes   Other Topics  Concern  . None   Social History Narrative   Lives with his daughter currently and receives SSI.    Additional Social History:    Allergies:   Allergies  Allergen Reactions  . Aspirin     Aggravates ulcer. "Causes chest pain."  . Pepperoni [Pickled Meat] Other (See Comments)    aggrevates ulcer  . Tomato Other (See Comments)    Foods with tomato sauce aggrevate ulcers  . Tylenol [Acetaminophen]     Pt reports hx of ulcers    Labs: No results found for this or any previous visit (from the past 59 hour(s)).  Current Facility-Administered Medications  Medication Dose Route Frequency Provider Last Rate Last Dose  . alum & mag hydroxide-simeth (MAALOX/MYLANTA) 200-200-20 MG/5ML suspension 30 mL  30 mL Oral Q4H PRN [name], NP      . feeding supplement (ENSURE ENLIVE) (ENSURE ENLIVE) liquid 237 mL  237 mL Oral BID BM [name], NP   237 mL at 03/05/16 1400  . LORazepam (ATIVAN) tablet 1 mg  1 mg Oral TID [name], NP   1 mg at 03/06/16 1132   Followed by  . [START ON 03/07/2016] LORazepam (ATIVAN) tablet 1 mg  1 mg Oral BID [name], NP       Followed by  . [START ON 03/08/2016] LORazepam (ATIVAN) tablet 1 mg  1 mg Oral Daily [name], NP      . magnesium hydroxide (MILK OF MAGNESIA) suspension 30 mL  30 mL Oral Daily PRN [name], NP      . multivitamin with minerals tablet 1 tablet  1 tablet Oral Daily [name], NP   1 tablet at 03/06/16 [id number]  . nicotine (NICODERM CQ - dosed in mg/24 hours) patch 21 mg  21 mg Transdermal Daily [name], NP      . pantoprazole (PROTONIX) EC tablet 40 mg  40 mg Oral Daily PRN Linard Millers, MD      . QUEtiapine (SEROQUEL) tablet 100 mg  100 mg Oral QHS [name], NP   100 mg at 03/05/16 2155  . thiamine (B-1) injection 100 mg  100 mg Intramuscular Daily Linard Millers, MD      . thiamine (VITAMIN B-1) tablet 100 mg  100 mg Oral Daily [name], NP   100 mg at 03/06/16 [id number]     Musculoskeletal: Strength & Muscle Tone: within normal limits Gait & Station: normal Patient leans: N/A  Psychiatric Specialty Exam: Physical Exam  Constitutional: He is oriented to person, place, and time. He appears well-developed and well-nourished.  HENT:  Head: Normocephalic.  Neck: Normal range of motion.  Respiratory: Effort normal.  Musculoskeletal: Normal range of motion.  Neurological: He is alert and oriented to person, place, and time.  Skin: Skin is warm and dry.  Psychiatric: His speech is normal and behavior is normal. Judgment normal. Cognition and memory are normal. He exhibits a depressed mood. He expresses suicidal ideation. He expresses suicidal plans.    Review of Systems  Constitutional: Negative.   HENT: Negative.   Eyes: Negative.   Respiratory: Negative.   Cardiovascular: Negative.   Gastrointestinal: Negative.   Genitourinary: Negative.   Musculoskeletal: Negative.   Skin: Negative.   Neurological: Negative.   Endo/Heme/Allergies: Negative.   Psychiatric/Behavioral: Positive for depression, substance abuse and suicidal ideas.    Blood pressure 97/66, pulse 69, temperature 97.8 F (36.6 C), temperature source Oral, resp. rate 16, height 5\' 4"  (1.626 m), weight 49.9 kg (110 lb), SpO2 100 %.Body mass index is 18.88 kg/m.  General Appearance: Casual  Eye Contact:  Fair  Speech:  Normal Rate  Volume:  Decreased  Mood:  Depressed  Affect:  Congruent  Thought Process:  Coherent and Descriptions of Associations: Intact  Orientation:  Full (Time, Place, and Person)  Thought Content:  Rumination  Suicidal Thoughts:  Yes.  with intent/plan  Homicidal Thoughts:  No  Memory:  Immediate;   Fair Recent;   Fair Remote;   Fair  Judgement:  Fair  Insight:  Fair  Psychomotor Activity:  Decreased  Concentration:  Concentration: Fair and Attention Span: Fair  Recall:  [employer] of Knowledge:  Fair  Language:  Good  Akathisia:  No  Handed:  Right   AIMS (if indicated):     Assets:  Leisure Time Physical Health Resilience  ADL's:  Intact  Cognition:  WNL  Sleep:  Number of Hours: 6     Treatment Plan Summary: Daily contact with patient to assess and evaluate symptoms and progress in treatment, Medication management and Plan major depressive disorder, recurrent, severe wihtout psychosis:  -Crisis stabilization -Medication management:  Ativan alcohol detox protocol started along with Seroquel 100 mg at bedtime for mood stabilization and sleep -Individual and substance counseling -Transfer to [facility] inpatient unit  Disposition: Recommend psychiatric Inpatient admission when medically cleared.  Waylan Boga, NP 03/06/2016 12:31 PM   Reviewed the information documented and agree with the treatment plan.  Jozlynn Plaia 03/08/2016 11:47 AM

## 2016-03-06 NOTE — BHH Group Notes (Signed)
[place] Group Notes: (Clinical Social Work)   03/06/2016      Type of Therapy:  Group Therapy   Participation Level:  Did Not Attend despite MHT prompting   Selmer Dominion, LCSW 03/06/2016, 12:50 PM

## 2016-03-06 NOTE — BHH Group Notes (Signed)
Adult Psychoeducational Group Note  Date:  03/06/2016 Time:  2:21 AM  Group Topic/Focus:  Wrap-Up Group:   The focus of this group is to help patients review their daily goal of treatment and discuss progress on daily workbooks.   Participation Level:  Minimal  Participation Quality:  Appropriate and Attentive  Affect:  Appropriate  Cognitive:  Alert and Appropriate  Insight: Appropriate  Engagement in Group:  Engaged  Modes of Intervention:  Discussion  Additional Comments:  Pt was attentive and appropriate during tonights wrap up discussion with aa/na Theodoro Grist D 03/06/2016, 2:21 AM

## 2016-03-06 NOTE — BHH Group Notes (Signed)
[place] Group Notes:  (Nursing/MHT/Case Management/Adjunct)  Date:  03/06/2016  Time:  10:33 AM  Type of Therapy:  Psychoeducational Skills  Participation Level:  Did Not Attend  Participation Quality:  Did Not Attend  Affect:  Did Not Attend  Cognitive:  Did Not Attend  Insight:  None  Engagement in Group:  Did Not Attend  Modes of Intervention:  Did Not Attend  Summary of Progress/Problems: Pt did not attend patient self inventory group.   Benancio Deeds Shanta 03/06/2016, 10:33 AM

## 2016-03-06 NOTE — Progress Notes (Signed)
Nursing Progress Note: 7p-7a D: Pt currently presents with a depressed affect and seclusive behavior. Pt reports to Probation officer that their goal is to "learn how not to trust people as much (in regards to giving his bank card to an acquaintance)." Pt states "I need to become more serious in taking care of business (in reference to jobs, living arrangements, family, etc.)." Pt reports off and on sleep with current medication regimen. Pt rates his day as a 7 out of 10. He rates anxiety as a 10 out of 10 and depression 8 out of 10.  A: Pt provided with medications per providers orders. Pt's labs and vitals were monitored throughout the night. Pt supported emotionally and encouraged to express concerns and questions. Pt educated on medications.  R: Pt's safety ensured with 15 minute and environmental checks. Pt currently denies SI/HI/Self Harm and A/V hallucinations. Pt verbally agrees to seek staff if SI/HI or A/VH occurs and to consult with staff before acting on any harmful thoughts. Will continue POC.

## 2016-03-06 NOTE — Progress Notes (Signed)
[place] Group Notes:  (Nursing/MHT/Case Management/Adjunct)  Date:  03/06/2016  Time:  2045  Type of Therapy:  wrap up group  Participation Level:  Active  Participation Quality:  Appropriate, Attentive, Sharing and Supportive  Affect:  Flat  Cognitive:  Appropriate  Insight:  Lacking  Engagement in Group:  Engaged  Modes of Intervention:  Clarification, Education and Support  Summary of Progress/Problems: Pt shared that he is ready to get "out of the hell hole that is [place], I've been here for 30 years. If I can just stop buying crack when I get any money in my pocket".  Shellia Cleverly 03/06/2016, 10:24 PM

## 2016-03-06 NOTE — Progress Notes (Signed)
D: Patient's self inventory sheet: patient has fair sleep, did not recieve sleep medication.fair  Appetite, hyper energy level, good concentration. Rated depression 8/10, hopeless 8/10, anxiety 8/10. SI/HI/AVH: Denies current SI/HI, endorses AVH. Physical complaints are denied. Goal is "to feel better". Plans to work on "go to group".   A: Medications administered, assessed medication knowledge and education given on medication regimen.  Emotional support and encouragement given patient. R: Denies SI and HI , contracts for safety. Safety maintained with 15 minute checks.

## 2016-03-06 NOTE — Tx Team (Signed)
Initial Treatment Plan 03/06/2016 4:00 AM NESTA CUDE [id number]    PATIENT STRESSORS: Health problems Substance abuse   PATIENT STRENGTHS: Average or above average intelligence Communication skills Motivation for treatment/growth   PATIENT IDENTIFIED PROBLEMS: Suicidal Ideation  Depression  Restlessness at Night- "I just can't sleep" like I used to"  Homicidal Ideation to "the person that sold me cocaine last time"  A/V Hallucinations "I got two people talking to me on my shoulder"             DISCHARGE CRITERIA:  Ability to meet basic life and health needs Reduction of life-threatening or endangering symptoms to within safe limits  PRELIMINARY DISCHARGE PLAN: Return to previous work or school arrangements  PATIENT/FAMILY INVOLVEMENT: This treatment plan has been presented to and reviewed with the patient, GARVEY REFFETT.  The patient and family have been given the opportunity to ask questions and make suggestions.  Gwendolyn Fill, RN 03/06/2016, 4:00 AM

## 2016-03-07 NOTE — Progress Notes (Signed)
D) Pt.  Remained in room in between scheduled groups.  Appetite poor.  Pt. Reports he ate eggs and grits for breakfast,  But ate no lunch.  Received ensure this afternoon.  Refused lunch. Verbalized concern about friend pt reportedly gave his debit care to.  Pt. Asked about d/c. But did not make appropriate calls to stop payment on card despite strong encouragement.  A) Pt. Offered support.  Encouraged to express needs.  Assisted with filling out his self inventory.  R) Pt. Receptive, remains safe.

## 2016-03-07 NOTE — Progress Notes (Signed)
Patient ID: DONNELLY LEGALL, male   DOB: August 12, 1965, 50 y.o.   MRN: [id number]   Pt currently presents with a flat affect and depressed, guarded behavior. Pt reports to writer that their goal is to "figure out what is going on with my card (money)." Pt forwards little to Probation officer. Pt reports good sleep with current medication regimen.   Pt provided with medications per providers orders. Pt's labs and vitals were monitored throughout the night. Pt supported emotionally and encouraged to express concerns and questions. Pt educated on medications. Pt adheres to nutritional support regimen.   Pt's safety ensured with 15 minute and environmental checks. Pt currently denies SI/HI and A/V hallucinations. Pt verbally agrees to seek staff if SI/HI or A/VH occurs and to consult with staff before acting on any harmful thoughts. Will continue POC.

## 2016-03-07 NOTE — [facility] Group Notes (Signed)
Adult Therapy Group Note  Date: 03/07/2016 Time:  10:00-11:00AM  Group Topic/Focus: Healthy Air cabin crew Esteem:   The focus of this group was to assist patients in identifying current healthy supports, as well as how to widen their support systems.  Examples given by various patients were used to emphasize the importance of expanding supports, with an emphasis on the use of AA/NA, problem-specific support groups, doctors, counselors, and self.  Most patients also chose to share the reasons for their current hospitalization, and received much encouragement and support from their fellow patients as they did this.   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  He was open to the group's suggestions to him that if he does not find ways of taking care of himself, he will not be able to keep caring for others.  Maretta Los, LCSW 03/07/2016  4:15PM

## 2016-03-07 NOTE — Progress Notes (Signed)
[facility] MD Progress Note  03/07/2016 3:42 PM [name] [name]  MRN:  [id number] Subjective:   Patient seen, chart reviewed and case discussed with nursing staff.   Patient states that he feels better, although he continues to feel depressed. He is concerned about his debit card he handed to one of his friends about a week ago. He is worried that this person might use his money and he wants to be discharged today. He denies SI/HI. He denies AH/VH.   Principal Problem: Cocaine abuse with cocaine-induced mood disorder ([place]) Diagnosis:   Patient Active Problem List   Diagnosis Date Noted  . Cocaine abuse with cocaine-induced mood disorder ([place]) [F14.14] 03/04/2016  . Alcohol dependence with uncomplicated withdrawal ([place]) [F10.230]   . Bipolar affective disorder, depressed, severe ([place]) [F31.4] 11/22/2015  . Alcohol use disorder, severe, dependence ([place]) [F10.20] 01/18/2015  . Alcohol dependence with alcohol-induced mood disorder ([place]) [F10.24]   . Suicidal ideation [R45.851]   . Acute pancreatitis [K85.90] 03/20/2012  . Polysubstance abuse [F19.10] 03/20/2012  . Cocaine abuse [F14.10] 01/12/2012  . Cannabis abuse [F12.10] 01/12/2012  . Alcohol dependence ([place]) [F10.20] 10/15/2011  . Abnormal ECG [R94.31]   . Chest pain [R07.89]   . CAD in native artery [I25.10]   . GERD (gastroesophageal reflux disease) [K21.9]   . Tobacco abuse [Z72.0]   . History of cocaine abuse [Z87.898]   . History of ETOH abuse [Z87.898]   . Bradycardia [R00.1]   . Syncope [R55]    Total Time spent with patient: 15 minutes  Past Psychiatric History: see HPI  Past Medical History:  Past Medical History:  Diagnosis Date  . Abnormal ECG    a. early repolarization  . Arthritis    "my whole left side" (05/03/2014)  . Bipolar disorder ([place])   . Bradycardia    a. asymptomatic  . CAD in native artery    a. Nonobstructive cath 11/2007;  b. Presented with ST elevation - Nonobstructive cath 08/2011  . Chest pain, mid  sternal   . Coronary artery disease   . GERD (gastroesophageal reflux disease)   . History of cocaine abuse    a. quit ? 2009  . History of ETOH abuse    a. drinks 2 "40's" / wk  . Marijuana abuse    a. uses ~ 1x /wk or less  . Pneumonia   . Stomach ulcer   . Syncope    a. 12/2010 - presumed to be vasovagal  . Tobacco abuse     Past Surgical History:  Procedure Laterality Date  . CARDIAC CATHETERIZATION  2009; 08/2011   [name] 08/06/2011  . LEFT HEART CATHETERIZATION WITH CORONARY ANGIOGRAM N/A 08/16/2011   Procedure: LEFT HEART CATHETERIZATION WITH CORONARY ANGIOGRAM;  Surgeon: [name], MD;  Location: [facility] CATH LAB;  Service: Cardiovascular;  Laterality: N/A;   Family History: History reviewed. No pertinent family history. Family Psychiatric  History: see HPI Social History:  History  Alcohol Use  . 28.8 oz/week  . 48 Cans of beer per week    Comment: none for 2-3 weeks     History  Drug Use  . Types: Marijuana, Cocaine    Comment: last use Jun 24, 2015    Social History   Social History  . Marital status: Single    Spouse name: N/A  . Number of children: N/A  . Years of education: N/A   Social History Main Topics  . Smoking status: Current Some Day Smoker  Packs/day: 0.50    Years: 20.00    Types: Cigarettes    Last attempt to quit: 06/24/2015  . Smokeless tobacco: Never Used  . Alcohol use 28.8 oz/week    48 Cans of beer per week     Comment: none for 2-3 weeks  . Drug use:     Types: Marijuana, Cocaine     Comment: last use Jun 24, 2015  . Sexual activity: Yes   Other Topics Concern  . None   Social History Narrative   Lives with his daughter currently and receives SSI.    Additional Social History:                         Sleep: Fair  Appetite:  Fair  Current Medications: Current Facility-Administered Medications  Medication Dose Route Frequency Provider Last Rate Last Dose  . alum & mag hydroxide-simeth (MAALOX/MYLANTA)  200-200-20 MG/5ML suspension 30 mL  30 mL Oral Q4H PRN [name], NP      . feeding supplement (ENSURE ENLIVE) (ENSURE ENLIVE) liquid 237 mL  237 mL Oral BID BM [name], NP   237 mL at 03/07/16 1316  . LORazepam (ATIVAN) tablet 1 mg  1 mg Oral BID [name], NP   1 mg at 03/07/16 0930   Followed by  . [START ON 03/08/2016] LORazepam (ATIVAN) tablet 1 mg  1 mg Oral Daily [name], NP      . magnesium hydroxide (MILK OF MAGNESIA) suspension 30 mL  30 mL Oral Daily PRN [name], NP      . multivitamin with minerals tablet 1 tablet  1 tablet Oral Daily [name], NP   1 tablet at 03/07/16 0930  . nicotine (NICODERM CQ - dosed in mg/24 hours) patch 21 mg  21 mg Transdermal Daily [name], NP      . pantoprazole (PROTONIX) EC tablet 40 mg  40 mg Oral Daily PRN Linard Millers, MD      . QUEtiapine (SEROQUEL) tablet 100 mg  100 mg Oral QHS [name], NP   100 mg at 03/06/16 2226  . sertraline (ZOLOFT) tablet 25 mg  25 mg Oral Daily Norman Clay, MD   25 mg at 03/07/16 0931  . thiamine (VITAMIN B-1) tablet 100 mg  100 mg Oral Daily [name], NP   100 mg at 03/07/16 0930    Lab Results: No results found for this or any previous visit (from the past 4 hour(s)).  Blood Alcohol level:  Lab Results  Component Value Date   ETH 203 (H) 03/04/2016   ETH 129 (H) [id number]    Metabolic Disorder Labs: Lab Results  Component Value Date   HGBA1C 4.5 05/03/2014   MPG 82 05/03/2014   MPG 100 08/16/2011   No results found for: PROLACTIN Lab Results  Component Value Date   CHOL 159 05/03/2014   TRIG 97 05/03/2014   HDL 98 05/03/2014   CHOLHDL 1.6 05/03/2014   VLDL 19 05/03/2014   LDLCALC 42 05/03/2014   LDLCALC 75 03/20/2012    Physical Findings: AIMS: Facial and Oral Movements Muscles of Facial Expression: None, normal Lips and Perioral Area: None, normal Jaw: None, normal Tongue: None, normal,Extremity Movements Upper (arms, wrists,  hands, fingers): None, normal Lower (legs, knees, ankles, toes): None, normal, Trunk Movements Neck, shoulders, hips: None, normal, Overall Severity Severity of abnormal movements (highest score from questions above):  None, normal Incapacitation due to abnormal movements: None, normal Patient's awareness of abnormal movements (rate only patient's report): No Awareness, Dental Status Current problems with teeth and/or dentures?: No Does patient usually wear dentures?: No  CIWA:  CIWA-Ar Total: 0 COWS:  COWS Total Score: 2  Musculoskeletal: Strength & Muscle Tone: within normal limits Gait & Station: normal Patient leans: N/A  Psychiatric Specialty Exam: Physical Exam  Review of Systems  Constitutional: Positive for malaise/fatigue. Negative for diaphoresis.  Eyes: Negative for blurred vision.  Cardiovascular: Negative for chest pain and palpitations.  Neurological: Positive for dizziness.  Psychiatric/Behavioral: Positive for depression and substance abuse. Negative for hallucinations and suicidal ideas. The patient is nervous/anxious and has insomnia.     Blood pressure 108/68, pulse (!) 58, temperature 97.8 F (36.6 C), temperature source Oral, resp. rate 16, height 5\' 4"  (1.626 m), weight 110 lb (49.9 kg), SpO2 100 %.Body mass index is 18.88 kg/m.  General Appearance: Disheveled  Eye Contact:  Fair  Speech:  Clear and Coherent  Volume:  Normal  Mood:  Depressed  Affect:  Depressed  Thought Process:  Coherent  Orientation:  Full (Time, Place, and Person)  Thought Content:  Logical  Suicidal Thoughts:  No  Homicidal Thoughts:  No  Memory:  Immediate;   Fair Recent;   Fair Remote;   Fair  Judgement:  Fair  Insight:  Present  Psychomotor Activity:  Decreased  Concentration:  Concentration: Fair and Attention Span: Fair  Recall:  [employer] of Knowledge:  Fair  Language:  Good  Akathisia:  No  Handed:  Right  AIMS (if indicated):     Assets:  Communication  Skills Desire for Improvement  ADL's:  Intact  Cognition:  WNL  Sleep:  Number of Hours: 6.25   Assessment 50 year old male with cocaine use, alcohol use, bipolar disorder per chart, presented for detox.   # Alcohol withdrawal Patient has been tolerating alcohol detox without significant symptoms. Will continue current regimen.   # Substance induced mood disorder # bipolar disorder per chart Patient has been tolerating well on sertraline. Noted that although patient does have history of bipolar disorder per chart, he denies any significant manic symptoms in the past. Will monitor any manic symptoms.   Patient asked for signing 72 hour treatment, but has agreed to discuss with SW on Monday for available resources for his alcohol use. He is planning to stay at his friend's house.   Plans - Continue ativan protocol - Continue Seroquel 100 mg qhs -Continue sertraline 25 mg daily - Medication management to reduce current symptoms to base line and improve the patient's overall level of functioning. - Monitor for the adverse effect of the medications and anger outbursts - Continue 15 minutes observation for safety concerns - Encouraged to participate in milieu therapy and group therapy counseling sessions and also work with coping skills -  Develop treatment plan to decrease risk of relapse upon discharge and to reduce the need for readmission. -  Psycho-social education regarding relapse prevention and self care. - Health care follow up as needed for medical problems. - Restart home medications where appropriate.   Treatment Plan Summary: Daily contact with patient to assess and evaluate symptoms and progress in treatment  Norman Clay, MD 03/07/2016, 3:42 PM

## 2016-03-07 NOTE — Progress Notes (Signed)
Patient did not attend the evening speaker AA meeting. Pt was notified that group was beginning but remained in bed.   

## 2016-03-08 MED ORDER — NICOTINE 21 MG/24HR TD PT24: 21.0000 mg | MEDICATED_PATCH | Freq: Every day | TRANSDERMAL | 0 refills | Status: DC

## 2016-03-08 MED ORDER — QUETIAPINE FUMARATE 100 MG PO TABS: 100.0000 mg | ORAL_TABLET | Freq: Every day | ORAL | 0 refills | Status: DC

## 2016-03-08 MED ORDER — SERTRALINE HCL 25 MG PO TABS: 25.0000 mg | ORAL_TABLET | Freq: Every day | ORAL | 0 refills | Status: DC

## 2016-03-08 NOTE — Progress Notes (Signed)
Recreation Therapy Notes  Date: 03/08/16  Time: 0930 Location: 300 [address] Dayroom  Group Topic: Stress Management  Goal Area(s) Addresses:  Patient will verbalize importance of using healthy stress management.  Patient will identify positive emotions associated with healthy stress management.   Intervention: Guided Imagery  Activity :  Engineering geologist.  LRT introduced to the technique of guided imagery to patients.  LRT read a script so patients could participate in the technique.  Patients were to follow along as LRT read script.  Education:  Stress Management, Discharge Planning.   Education Outcome: Acknowledges edcuation/In group clarification offered/Needs additional education  Clinical Observations/Feedback: Pt did not attend group.      Victorino Sparrow, LRT/CTRS         Victorino Sparrow A 03/08/2016 11:53 AM

## 2016-03-08 NOTE — Progress Notes (Signed)
Discharge note:  Patient discharged home per MD order.  Patient received all personal belongings from locker and unit. Patient will follow up with [facility] for medication management.  Patient denies any thoughts of self harm; HI/AVH.  Patient received prescriptions and indicated understanding of discharge instructions.  Patient left ambulatory with a bus pass.

## 2016-03-08 NOTE — Tx Team (Signed)
Interdisciplinary Treatment and Diagnostic Plan Update  03/08/2016 Time of Session: 9:30am [name] [name] MRN: [id number]  Principal Diagnosis: Cocaine abuse with cocaine-induced mood disorder ([place])  Secondary Diagnoses: Principal Problem:   Cocaine abuse with cocaine-induced mood disorder ([place]) Active Problems:   Alcohol dependence ([place])   Bipolar affective disorder, depressed, severe ([place])   Alcohol dependence with uncomplicated withdrawal ([place])   Current Medications:  Current Facility-Administered Medications  Medication Dose Route Frequency Provider Last Rate Last Dose  . alum & mag hydroxide-simeth (MAALOX/MYLANTA) 200-200-20 MG/5ML suspension 30 mL  30 mL Oral Q4H PRN [name], NP      . feeding supplement (ENSURE ENLIVE) (ENSURE ENLIVE) liquid 237 mL  237 mL Oral BID BM [name], NP   237 mL at 03/08/16 1000  . magnesium hydroxide (MILK OF MAGNESIA) suspension 30 mL  30 mL Oral Daily PRN [name], NP      . multivitamin with minerals tablet 1 tablet  1 tablet Oral Daily [name], NP   1 tablet at 03/08/16 0817  . nicotine (NICODERM CQ - dosed in mg/24 hours) patch 21 mg  21 mg Transdermal Daily [name], NP      . pantoprazole (PROTONIX) EC tablet 40 mg  40 mg Oral Daily PRN Linard Millers, MD      . QUEtiapine (SEROQUEL) tablet 100 mg  100 mg Oral QHS [name], NP   100 mg at 03/07/16 2125  . sertraline (ZOLOFT) tablet 25 mg  25 mg Oral Daily Norman Clay, MD   25 mg at 03/08/16 0817  . thiamine (VITAMIN B-1) tablet 100 mg  100 mg Oral Daily [name], NP   100 mg at 03/08/16 [id number]   PTA Medications: Prescriptions Prior to Admission  Medication Sig Dispense Refill Last Dose  . omeprazole (PRILOSEC) 20 MG capsule Take 1 capsule (20 mg total) by mouth 2 (two) times daily before a meal. 28 capsule 0 Past Month at Unknown time  . QUEtiapine (SEROQUEL) 50 MG tablet Take 2 tablets (100 mg total) by mouth at bedtime. (Patient not taking:  Reported on 12/22/2015) 60 tablet 0 Not Taking at Unknown time    Patient Stressors: Health problems Substance abuse  Patient Strengths: Average or above average intelligence Communication skills Motivation for treatment/growth  Treatment Modalities: Medication Management, Group therapy, Case management,  1 to 1 session with clinician, Psychoeducation, Recreational therapy.   Physician Treatment Plan for Primary Diagnosis: Cocaine abuse with cocaine-induced mood disorder ([place]) Long Term Goal(s): Improvement in symptoms so as ready for discharge Improvement in symptoms so as ready for discharge   Short Term Goals: Ability to maintain clinical measurements within normal limits will improve Ability to disclose and discuss suicidal ideas Ability to identify and develop effective coping behaviors will improve Ability to identify triggers associated with substance abuse/mental health issues will improve  Medication Management: Evaluate patient's response, side effects, and tolerance of medication regimen.  Therapeutic Interventions: 1 to 1 sessions, Unit Group sessions and Medication administration.  Evaluation of Outcomes: Adequate for Discharge  Physician Treatment Plan for Secondary Diagnosis: Principal Problem:   Cocaine abuse with cocaine-induced mood disorder ([facility]) Active Problems:   Alcohol dependence ([facility])   Bipolar affective disorder, depressed, severe ([place])   Alcohol dependence with uncomplicated withdrawal ([place])  Long Term Goal(s): Improvement in symptoms so as ready for discharge Improvement in symptoms so as ready for discharge   Short Term Goals: Ability to maintain clinical  measurements within normal limits will improve Ability to disclose and discuss suicidal ideas Ability to identify and develop effective coping behaviors will improve Ability to identify triggers associated with substance abuse/mental health issues will improve     Medication Management: Evaluate  patient's response, side effects, and tolerance of medication regimen.  Therapeutic Interventions: 1 to 1 sessions, Unit Group sessions and Medication administration.  Evaluation of Outcomes: Adequate for Discharge   RN Treatment Plan for Primary Diagnosis: Cocaine abuse with cocaine-induced mood disorder ([place]) Long Term Goal(s): Knowledge of disease and therapeutic regimen to maintain health will improve  Short Term Goals: Ability to remain free from injury will improve, Ability to disclose and discuss suicidal ideas, Ability to identify and develop effective coping behaviors will improve and Compliance with prescribed medications will improve  Medication Management: RN will administer medications as ordered by provider, will assess and evaluate patient's response and provide education to patient for prescribed medication. RN will report any adverse and/or side effects to prescribing provider.  Therapeutic Interventions: 1 on 1 counseling sessions, Psychoeducation, Medication administration, Evaluate responses to treatment, Monitor vital signs and CBGs as ordered, Perform/monitor CIWA, COWS, AIMS and Fall Risk screenings as ordered, Perform wound care treatments as ordered.  Evaluation of Outcomes: Adequate for Discharge   LCSW Treatment Plan for Primary Diagnosis: Cocaine abuse with cocaine-induced mood disorder ([place]) Long Term Goal(s): Safe transition to appropriate next level of care at discharge, Engage patient in therapeutic group addressing interpersonal concerns.  Short Term Goals: Engage patient in aftercare planning with referrals and resources, Increase social support, Increase emotional regulation, Identify triggers associated with mental health/substance abuse issues and Increase skills for wellness and recovery  Therapeutic Interventions: Assess for all discharge needs, 1 to 1 time with Social worker, Explore available resources and support systems, Assess for adequacy in  community support network, Educate family and significant other(s) on suicide prevention, Complete Psychosocial Assessment, Interpersonal group therapy.  Evaluation of Outcomes: Adequate for Discharge   Progress in Treatment :  Attending groups: Intermittently  Participating in groups: Minimally  Taking medication as prescribed: Yes, MD continuing to assess for appropriate medication regimen  Toleration medication: Yes  Family/Significant other contact made: No, patient has declined for collateral contact   Patient understands diagnosis: Yes  Discussing patient identified problems/goals with staff: Yes  Medical problems stabilized or resolved: Yes  Denies suicidal/homicidal ideation: Yes, denies  Issues/concerns per patient self-inventory: None reported  Other: N/A  New problem(s) identified: None reported at this time    New Short Term/Long Term Goal(s): None at this time    Discharge Plan or Barriers: Patient plans to return home with parents to follow up with outpatient services.      Reason for Continuation of Hospitalization: Anxiety Depression Medication stabilization Withdrawal symptoms  Estimated Length of Stay: Discharge anticipated for today 03/08/16    Attendees:  Patient:              Physician: Dr. Sharolyn Douglas , MD  03/08/2016   9:30am  Nursing: Haskel Khan, RN  03/08/2016 9:30am  RN Care Manager: Lars Pinks, [facility]  03/08/2016 9:30am  Social Workers:  Erasmo Downer Davis Ambrosini, [place]   03/08/2016 9:30am  Nurse Pratictioners: Samuel Jester, NP, Ricky Ala, NP 03/08/2016 9:30am  Other:              03/08/2016 9:30am   Scribe for Treatment Team: Tilden Fossa, Ginger Blue Worker [facility] [phone]

## 2016-03-08 NOTE — BHH Suicide Risk Assessment (Signed)
[place] INPATIENT:  Family/Significant Other Suicide Prevention Education  Suicide Prevention Education:  Patient Refusal for Family/Significant Other Suicide Prevention Education: The patient [name] [name] has refused to provide written consent for family/significant other to be provided Family/Significant Other Suicide Prevention Education during admission and/or prior to discharge.  Physician notified. SPE reviewed with patient and brochure provided. Patient encouraged to return to hospital if having suicidal thoughts, patient verbalized his/her understanding and has no further questions at this time.   Aolani Piggott L Shalawn Wynder 03/08/2016, 10:25 AM

## 2016-03-08 NOTE — BHH Suicide Risk Assessment (Signed)
[facility] Discharge Suicide Risk Assessment   Principal Problem: Cocaine abuse with cocaine-induced mood disorder [facility]) Discharge Diagnoses:  Patient Active Problem List   Diagnosis Date Noted  . Cocaine abuse with cocaine-induced mood disorder ([place]) [F14.14] 03/04/2016  . Alcohol dependence with uncomplicated withdrawal ([place]) [F10.230]   . Bipolar affective disorder, depressed, severe ([name]) [F31.4] 11/22/2015  . Alcohol use disorder, severe, dependence ([place]) [F10.20] 01/18/2015  . Alcohol dependence with alcohol-induced mood disorder ([place]) [F10.24]   . Suicidal ideation [R45.851]   . Acute pancreatitis [K85.90] 03/20/2012  . Polysubstance abuse [F19.10] 03/20/2012  . Cocaine abuse [F14.10] 01/12/2012  . Cannabis abuse [F12.10] 01/12/2012  . Alcohol dependence ([place]) [F10.20] 10/15/2011  . Abnormal ECG [R94.31]   . Chest pain [R07.89]   . CAD in native artery [I25.10]   . GERD (gastroesophageal reflux disease) [K21.9]   . Tobacco abuse [Z72.0]   . History of cocaine abuse [Z87.898]   . History of ETOH abuse [Z87.898]   . Bradycardia [R00.1]   . Syncope [R55]     Total Time spent with patient: 15 minutes  Musculoskeletal: Strength & Muscle Tone: within normal limits Gait & Station: normal Patient leans: N/A  Psychiatric Specialty Exam: ROS problems with front incisors otherwise no complaints   Blood pressure 104/65, pulse (!) 56, temperature 98.2 F (36.8 C), temperature source Oral, resp. rate 16, height 5\' 4"  (1.626 m), weight 49.9 kg (110 lb), SpO2 100 %.Body mass index is 18.88 kg/m.  General Appearance: Casual  Eye Contact::  Good  Speech:  Clear and Coherent409  Volume:  Normal  Mood:  Euthymic  Affect:  Appropriate and Congruent  Thought Process:  Goal Directed  Orientation:  Negative  Thought Content:  Negative  Suicidal Thoughts:  No  Homicidal Thoughts:  No  Memory:  fair  Judgement:  Fair  Insight:  Shallow  Psychomotor Activity:  Normal  Concentration:   Fair  Recall:  [place] of Knowledge:Fair  Language: Good  Akathisia:  No  Handed:  Right  AIMS (if indicated):   0  Assets:  Resilience  Sleep:  Number of Hours: 5.5  Cognition: Impaired,  Mild  ADL's:  Intact   Mental Status Per Nursing Assessment::   On Admission:     Demographic Factors:  Male and Low socioeconomic status  Loss Factors: NA  Historical Factors: NA  Risk Reduction Factors:   Living with another person, especially a relative  Continued Clinical Symptoms:  Alcohol/Substance Abuse/Dependencies  Cognitive Features That Contribute To Risk:  Loss of executive function    Suicide Risk:  Mild:  Suicidal ideation of limited frequency, intensity, duration, and specificity.  There are no identifiable plans, no associated intent, mild dysphoria and related symptoms, good self-control (both objective and subjective assessment), few other risk factors, and identifiable protective factors, including available and accessible social support.  Follow-up Information    [facility] .   Specialty:  Behavioral Health Why:  Please go to walk-in clinic within 7 days of discharge Monday-Friday at 8am for assessment for therapy and medication management services.  Contact information: [name] [address] [phone]           Plan Of Care/Follow-up recommendations:  Other:  Mr. Barraco reports his mood "it's great." He denies any suicidal or homicidal ideation, plan or intent or any psychotic symptoms at present. He reports he has a place to live and offered to work on and uncles house. He is not sure if his current  medications are helpful and we discussed that he should remain in outpatient follow-up and consider changing to another medication if he continues to feel that they do not work. He should abstain from using substances as well. He should consider treatment for substance use disorders.  Linard Millers, MD 03/08/2016, 11:12 AM
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
## 2016-03-08 NOTE — Plan of Care (Signed)
Problem: Physical Regulation: Goal: Ability to maintain clinical measurements within normal limits will improve Outcome: Progressing Pt adhering to nutritional supplement regimen, appetite increasing

## 2016-03-08 NOTE — Discharge Summary (Signed)
Physician Discharge Summary Note  Patient:  [name] [name] is an 50 y.o., male MRN:  [id number] DOB:  Oct 19, 1965 Patient phone:  [phone] (home)  Patient address:   [place] [address],  Total Time spent with patient: 30 minutes  Date of Admission:  03/04/2016 Date of Discharge: 03/08/2016  Reason for Admission:    Principal Problem: Cocaine abuse with cocaine-induced mood disorder [facility]) Discharge Diagnoses: Patient Active Problem List   Diagnosis Date Noted  . Cocaine abuse with cocaine-induced mood disorder ([place]) [F14.14] 03/04/2016  . Alcohol dependence with uncomplicated withdrawal ([place]) [F10.230]   . Bipolar affective disorder, depressed, severe ([place]) [F31.4] 11/22/2015  . Alcohol use disorder, severe, dependence ([place]) [F10.20] 01/18/2015  . Alcohol dependence with alcohol-induced mood disorder ([place]) [F10.24]   . Suicidal ideation [R45.851]   . Acute pancreatitis [K85.90] 03/20/2012  . Polysubstance abuse [F19.10] 03/20/2012  . Cocaine abuse [F14.10] 01/12/2012  . Cannabis abuse [F12.10] 01/12/2012  . Alcohol dependence ([place]) [F10.20] 10/15/2011  . Abnormal ECG [R94.31]   . Chest pain [R07.89]   . CAD in native artery [I25.10]   . GERD (gastroesophageal reflux disease) [K21.9]   . Tobacco abuse [Z72.0]   . History of cocaine abuse [Z87.898]   . History of ETOH abuse [Z87.898]   . Bradycardia [R00.1]   . Syncope [R55]     Past Psychiatric History: see HPI  Past Medical History:  Past Medical History:  Diagnosis Date  . Abnormal ECG    a. early repolarization  . Arthritis    "my whole left side" (05/03/2014)  . Bipolar disorder ([place])   . Bradycardia    a. asymptomatic  . CAD in native artery    a. Nonobstructive cath 11/2007;  b. Presented with ST elevation - Nonobstructive cath 08/2011  . Chest pain, mid sternal   . Coronary artery disease   . GERD (gastroesophageal reflux disease)   . History of cocaine abuse    a. quit ? 2009  . History of  ETOH abuse    a. drinks 2 "40's" / wk  . Marijuana abuse    a. uses ~ 1x /wk or less  . Pneumonia   . Stomach ulcer   . Syncope    a. 12/2010 - presumed to be vasovagal  . Tobacco abuse     Past Surgical History:  Procedure Laterality Date  . CARDIAC CATHETERIZATION  2009; 08/2011   [name] 08/06/2011  . LEFT HEART CATHETERIZATION WITH CORONARY ANGIOGRAM N/A 08/16/2011   Procedure: LEFT HEART CATHETERIZATION WITH CORONARY ANGIOGRAM;  Surgeon: [name], MD;  Location: [facility] CATH LAB;  Service: Cardiovascular;  Laterality: N/A;   Family History: History reviewed. No pertinent family history. Family Psychiatric  History:  See HPI  Social History:  History  Alcohol Use  . 28.8 oz/week  . 48 Cans of beer per week    Comment: none for 2-3 weeks     History  Drug Use  . Types: Marijuana, Cocaine    Comment: last use Jun 24, 2015    Social History   Social History  . Marital status: Single    Spouse name: N/A  . Number of children: N/A  . Years of education: N/A   Social History Main Topics  . Smoking status: Current Some Day Smoker    Packs/day: 0.50    Years: 20.00    Types: Cigarettes    Last attempt to quit: 06/24/2015  . Smokeless tobacco: Never Used  . Alcohol use 28.8  oz/week    48 Cans of beer per week     Comment: none for 2-3 weeks  . Drug use:     Types: Marijuana, Cocaine     Comment: last use Jun 24, 2015  . Sexual activity: Yes   Other Topics Concern  . None   Social History Narrative   Lives with his daughter currently and receives SSI.     Hospital Course:  [name] [name] presented for detox from alcohol.  He also reported depression that contributed to agitation and irritability towards others.  [name] [name] was admitted for Cocaine abuse with cocaine-induced mood disorder [facility]) and crisis management.  Patient was treated with medications with their indications listed below in detail under Medication List.  Medical problems were identified and  treated as needed.  Home medications were restarted as appropriate.  Improvement was monitored by observation and [name] [name] daily report of symptom reduction.  Emotional and mental status was monitored by daily self inventory reports completed by [name] [name] and clinical staff.  Patient reported continued improvement, denied any new concerns.  Patient had been compliant on medications and denied side effects.  Support and encouragement was provided.    Patient encouraged to attend groups to help with recognizing triggers of emotional crises and de-stabilizations.  Patient encouraged to attend group to help identify the positive things in life that would help in dealing with feelings of loss, depression and unhealthy or abusive tendencies.         [name] [name] was evaluated by the treatment team for stability and plans for continued recovery upon discharge.  Patient was offered further treatment options upon discharge including Residential, Intensive Outpatient and Outpatient treatment. Patient will follow up with agency listed below for medication management and counseling.  Encouraged patient to maintain satisfactory support network and home environment.  Advised to adhere to medication compliance and outpatient treatment follow up.  Prescriptions provided.       [name] [name] motivation was an integral factor for scheduling further treatment.  Employment, transportation, bed availability, health status, family support, and any pending legal issues were also considered during patient's hospital stay.  Upon completion of this admission the patient was both mentally and medically stable for discharge denying suicidal/homicidal ideation, auditory/visual/tactile hallucinations, delusional thoughts and paranoia.      Physical Findings: AIMS: Facial and Oral Movements Muscles of Facial Expression: None, normal Lips and Perioral Area: None, normal Jaw: None, normal Tongue: None, normal,Extremity  Movements Upper (arms, wrists, hands, fingers): None, normal Lower (legs, knees, ankles, toes): None, normal, Trunk Movements Neck, shoulders, hips: None, normal, Overall Severity Severity of abnormal movements (highest score from questions above): None, normal Incapacitation due to abnormal movements: None, normal Patient's awareness of abnormal movements (rate only patient's report): No Awareness, Dental Status Current problems with teeth and/or dentures?: No Does patient usually wear dentures?: No  CIWA:  CIWA-Ar Total: 2 COWS:  COWS Total Score: 2  Musculoskeletal: Strength & Muscle Tone: within normal limits Gait & Station: normal Patient leans: N/A  Psychiatric Specialty Exam: Physical Exam  Nursing note and vitals reviewed. Psychiatric: His affect is not labile. He is not agitated. Thought content is not paranoid and not delusional. Cognition and memory are not impaired. He does not exhibit a depressed mood. He expresses no homicidal and no suicidal ideation.    Review of Systems  Constitutional: Negative.  Negative for fever.  HENT: Negative.   Eyes: Negative.  Negative for blurred  vision.  Respiratory: Negative.  Negative for cough.   Cardiovascular: Negative.  Negative for chest pain.  Gastrointestinal: Negative.  Negative for heartburn.  Genitourinary: Negative.  Negative for dysuria.  Musculoskeletal: Negative.  Negative for myalgias.  Skin: Negative.  Negative for rash.  Neurological: Negative.  Negative for dizziness and headaches.  Endo/Heme/Allergies: Negative.   Psychiatric/Behavioral: Negative.  Negative for depression.  All other systems reviewed and are negative.   Blood pressure 104/65, pulse (!) 56, temperature 98.2 F (36.8 C), temperature source Oral, resp. rate 16, height 5\' 4"  (1.626 m), weight 49.9 kg (110 lb), SpO2 100 %.Body mass index is 18.88 kg/m.   Have you used any form of tobacco in the last 30 days? (Cigarettes, Smokeless Tobacco, Cigars,  and/or Pipes): Yes  Has this patient used any form of tobacco in the last 30 days? (Cigarettes, Smokeless Tobacco, Cigars, and/or Pipes) Yes, Rx given to patient  Blood Alcohol level:  Lab Results  Component Value Date   ETH 203 (H) 03/04/2016   ETH 129 (H) [id number]    Metabolic Disorder Labs:  Lab Results  Component Value Date   HGBA1C 4.5 05/03/2014   MPG 82 05/03/2014   MPG 100 08/16/2011   No results found for: PROLACTIN Lab Results  Component Value Date   CHOL 159 05/03/2014   TRIG 97 05/03/2014   HDL 98 05/03/2014   CHOLHDL 1.6 05/03/2014   VLDL 19 05/03/2014   LDLCALC 42 05/03/2014   LDLCALC 75 03/20/2012    See Psychiatric Specialty Exam and Suicide Risk Assessment completed by Attending Physician prior to discharge.  Discharge destination:  Home  Is patient on multiple antipsychotic therapies at discharge:  No   Has Patient had three or more failed trials of antipsychotic monotherapy by history:  No  Recommended Plan for Multiple Antipsychotic Therapies: NA     Medication List    STOP taking these medications   omeprazole 20 MG capsule Commonly known as:  PRILOSEC     TAKE these medications     Indication  nicotine 21 mg/24hr patch Commonly known as:  NICODERM CQ - dosed in mg/24 hours Place 1 patch (21 mg total) onto the skin daily. Start taking on:  03/09/2016  Indication:  Nicotine Addiction   QUEtiapine 100 MG tablet Commonly known as:  SEROQUEL Take 1 tablet (100 mg total) by mouth at bedtime. What changed:  medication strength  Indication:  mood stabilization   sertraline 25 MG tablet Commonly known as:  ZOLOFT Take 1 tablet (25 mg total) by mouth daily. Start taking on:  03/09/2016  Indication:  Major Depressive Disorder      Follow-up Information    [facility] .   Specialty:  Behavioral Health Why:  Please go to walk-in clinic within 7 days of discharge Monday-Friday at 8am for assessment for therapy and medication management  services.  Contact information: [place] [address] [phone]           Follow-up recommendations:  Activity:  as tol Diet:  as tol  Comments:  1.  Take all your medications as prescribed.   2.  Report any adverse side effects to outpatient provider. 3.  Patient instructed to not use alcohol or illegal drugs while on prescription medicines. 4.  In the event of worsening symptoms, instructed patient to call 911, the crisis hotline or go to nearest emergency room for evaluation of symptoms.  Signed: Janett Labella, NP [facility] 03/08/2016, 10:58 AM

## 2016-03-08 NOTE — Progress Notes (Signed)
  [facility] Adult Case Management Discharge Plan :  Will you be returning to the same living situation after discharge:  Yes,  patient plans to return home At discharge, do you have transportation home?: Yes,  bus pass provided Do you have the ability to pay for your medications: Yes,  patient will be provided with prescriptions at discharge  Release of information consent forms completed and in the chart;  Patient's signature needed at discharge.  Patient to Follow up at: Follow-up Information    [facility] .   Specialty:  Behavioral Health Why:  Please go to walk-in clinic within 7 days of discharge Monday-Friday at 8am for assessment for therapy and medication management services.  Contact information: [place]  [address] [phone]           Next level of care provider has access to [place] and Suicide Prevention discussed: Yes,  with patient   Have you used any form of tobacco in the last 30 days? (Cigarettes, Smokeless Tobacco, Cigars, and/or Pipes): Yes  Has patient been referred to the Quitline?: Patient refused referral  Patient has been referred for addiction treatment: Yes  Maddyx Vallie L Lynell Kussman 03/08/2016, 10:26 AM

## 2016-04-29 ENCOUNTER — Emergency Department (HOSPITAL_COMMUNITY): Payer: Medicaid Other

## 2016-04-29 ENCOUNTER — Emergency Department (HOSPITAL_COMMUNITY)
Admission: EM | Admit: 2016-04-29 | Discharge: 2016-04-30 | Disposition: A | Discharge status: Other Acute Inpatient Hospital | Payer: Medicaid Other | Attending: Emergency Medicine | Admitting: Emergency Medicine

## 2016-04-29 ENCOUNTER — Encounter (HOSPITAL_COMMUNITY): Payer: Self-pay | Admitting: Emergency Medicine

## 2016-04-29 DIAGNOSIS — R45851 Suicidal ideations: Secondary | ICD-10-CM

## 2016-04-29 DIAGNOSIS — F314 Bipolar disorder, current episode depressed, severe, without psychotic features: Secondary | ICD-10-CM | POA: Diagnosis present

## 2016-04-29 DIAGNOSIS — Z79899 Other long term (current) drug therapy: Secondary | ICD-10-CM | POA: Insufficient documentation

## 2016-04-29 DIAGNOSIS — F1020 Alcohol dependence, uncomplicated: Secondary | ICD-10-CM | POA: Diagnosis present

## 2016-04-29 DIAGNOSIS — F17210 Nicotine dependence, cigarettes, uncomplicated: Secondary | ICD-10-CM | POA: Insufficient documentation

## 2016-04-29 DIAGNOSIS — I2510 Atherosclerotic heart disease of native coronary artery without angina pectoris: Secondary | ICD-10-CM | POA: Insufficient documentation

## 2016-04-29 LAB — CBC
HCT: 40.2 % (ref 39.0–52.0)
HEMOGLOBIN: 14.2 g/dL (ref 13.0–17.0)
MCH: 31.3 pg (ref 26.0–34.0)
MCHC: 35.3 g/dL (ref 30.0–36.0)
MCV: 88.5 fL (ref 78.0–100.0)
Platelets: 236 10*3/uL (ref 150–400)
RBC: 4.54 MIL/uL (ref 4.22–5.81)
RDW: 13.5 % (ref 11.5–15.5)
WBC: 7.2 10*3/uL (ref 4.0–10.5)

## 2016-04-29 LAB — RAPID URINE DRUG SCREEN, HOSP PERFORMED
Amphetamines: NOT DETECTED
BARBITURATES: NOT DETECTED
Benzodiazepines: NOT DETECTED
COCAINE: NOT DETECTED
OPIATES: NOT DETECTED
TETRAHYDROCANNABINOL: POSITIVE — AB

## 2016-04-29 LAB — COMPREHENSIVE METABOLIC PANEL
ALK PHOS: 71 U/L (ref 38–126)
ALT: 15 U/L — AB (ref 17–63)
ANION GAP: 10 (ref 5–15)
AST: 27 U/L (ref 15–41)
Albumin: 4.7 g/dL (ref 3.5–5.0)
BUN: 7 mg/dL (ref 6–20)
CALCIUM: 9.1 mg/dL (ref 8.9–10.3)
CO2: 21 mmol/L — ABNORMAL LOW (ref 22–32)
CREATININE: 1.1 mg/dL (ref 0.61–1.24)
Chloride: 107 mmol/L (ref 101–111)
GFR calc non Af Amer: >60 mL/min (ref >60)
Glucose, Bld: 99 mg/dL (ref 65–99)
Potassium: 3.8 mmol/L (ref 3.5–5.1)
SODIUM: 138 mmol/L (ref 135–145)
Total Bilirubin: 0.9 mg/dL (ref 0.3–1.2)
Total Protein: 8 g/dL (ref 6.5–8.1)

## 2016-04-29 LAB — SALICYLATE LEVEL

## 2016-04-29 LAB — ACETAMINOPHEN LEVEL

## 2016-04-29 LAB — TROPONIN I: Troponin I: <0.03 ng/mL (ref <0.03)

## 2016-04-29 LAB — ETHANOL: Alcohol, Ethyl (B): 222 mg/dL — ABNORMAL HIGH (ref <5)

## 2016-04-29 NOTE — ED Provider Notes (Signed)
[place] DEPT Provider Note   CSN: [id number] Arrival date & time: 04/29/16  2216     History   Chief Complaint Chief Complaint  Patient presents with  . Suicidal    HPI [name] [name] is a 50 y.o. male.  [name] [name] is a 50 y.o. male with history of CAD s/p cardiac catheterization, GERD, etoh abuse, stomach ulcer, syncope, bradycardia, bipolar disorder presents to ED with complaint of depression and suicidal ideation. Patient reports increasing depression with the holidays due to strained relationship with family. He expresses suicidal ideation, but denies a plan. He states "I don't trust myself." Denies HI or V/A hallucinations. He endorses alcohol use - stating he drank beer and whiskey PTA, yesterday he drank 2 40oz beers. He also endorses smoking cigarettes and THC. Denies any other drug use. He reports he has been hospitalized in the past for his depression. He complains of URI like symptoms for the past two weeks. He also complains of intermittent sharp chest pains yesterday, but has since resolved. He states he has a stomach ulcer and has nausea in the morning, but resolves after eating. No other complaints at this time.       Past Medical History:  Diagnosis Date  . Abnormal ECG    a. early repolarization  . Arthritis    "my whole left side" (05/03/2014)  . Bipolar disorder ([place])   . Bradycardia    a. asymptomatic  . CAD in native artery    a. Nonobstructive cath 11/2007;  b. Presented with ST elevation - Nonobstructive cath 08/2011  . Chest pain, mid sternal   . Coronary artery disease   . GERD (gastroesophageal reflux disease)   . History of cocaine abuse    a. quit ? 2009  . History of ETOH abuse    a. drinks 2 "40's" / wk  . Marijuana abuse    a. uses ~ 1x /wk or less  . Pneumonia   . Stomach ulcer   . Syncope    a. 12/2010 - presumed to be vasovagal  . Tobacco abuse     Patient Active Problem List   Diagnosis Date Noted  . Cocaine abuse with  cocaine-induced mood disorder ([place]) 03/04/2016  . Alcohol dependence with uncomplicated withdrawal ([place])   . Bipolar affective disorder, depressed, severe ([place]) 11/22/2015  . Alcohol use disorder, severe, dependence ([place]) 01/18/2015  . Alcohol dependence with alcohol-induced mood disorder ([place])   . Suicidal ideation   . Acute pancreatitis 03/20/2012  . Polysubstance abuse 03/20/2012  . Cocaine abuse 01/12/2012  . Cannabis abuse 01/12/2012  . Alcohol dependence ([place]) 10/15/2011  . Abnormal ECG   . Chest pain   . CAD in native artery   . GERD (gastroesophageal reflux disease)   . Tobacco abuse   . History of cocaine abuse   . History of ETOH abuse   . Bradycardia   . Syncope     Past Surgical History:  Procedure Laterality Date  . CARDIAC CATHETERIZATION  2009; 08/2011   [name] 08/06/2011  . LEFT HEART CATHETERIZATION WITH CORONARY ANGIOGRAM N/A 08/16/2011   Procedure: LEFT HEART CATHETERIZATION WITH CORONARY ANGIOGRAM;  Surgeon: [name], MD;  Location: [facility] CATH LAB;  Service: Cardiovascular;  Laterality: N/A;       Home Medications    Prior to Admission medications   Medication Sig Start Date End Date Taking? Authorizing Provider  ranitidine (ZANTAC) 75 MG tablet Take 75 mg by mouth 2 (two) times daily.  Yes Historical Provider, MD  nicotine (NICODERM CQ - DOSED IN MG/24 HOURS) 21 mg/24hr patch Place 1 patch (21 mg total) onto the skin daily. Patient not taking: Reported on 04/29/2016 03/09/16   Kerrie Buffalo, NP  QUEtiapine (SEROQUEL) 100 MG tablet Take 1 tablet (100 mg total) by mouth at bedtime. Patient not taking: Reported on 04/29/2016 03/08/16   Kerrie Buffalo, NP  sertraline (ZOLOFT) 25 MG tablet Take 1 tablet (25 mg total) by mouth daily. Patient not taking: Reported on 04/29/2016 03/09/16   Kerrie Buffalo, NP    Family History No family history on file.  Social History Social History  Substance Use Topics  . Smoking status: Current Some Day Smoker     Packs/day: 0.50    Years: 20.00    Types: Cigarettes    Last attempt to quit: 06/24/2015  . Smokeless tobacco: Never Used  . Alcohol use 28.8 oz/week    48 Cans of beer per week     Comment: none for 2-3 weeks     Allergies   Aspirin; Pepperoni [pickled meat]; Tomato; and Tylenol [acetaminophen]   Review of Systems Review of Systems  Constitutional: Negative for fever.  HENT: Positive for congestion and rhinorrhea.   Respiratory: Negative for shortness of breath.   Cardiovascular: Positive for chest pain (yesterday, resolved).  Gastrointestinal: Negative for abdominal pain, diarrhea, nausea and vomiting.       Pt h/o stomach ulcer  Genitourinary: Negative for dysuria and hematuria.  Musculoskeletal: Negative for arthralgias.  Skin: Negative for rash.  Neurological: Negative for headaches.  Psychiatric/Behavioral: Positive for suicidal ideas.     Physical Exam Updated Vital Signs BP 102/76 (BP Location: Right Arm)   Pulse 102   Temp 98 F (36.7 C) (Oral)   Resp 18   SpO2 98%   Physical Exam  Constitutional: He appears well-developed and well-nourished. No distress.  HENT:  Head: Normocephalic and atraumatic.  Mouth/Throat: Oropharynx is clear and moist. No oropharyngeal exudate.  Eyes: Conjunctivae and EOM are normal. Pupils are equal, round, and reactive to light. Right eye exhibits no discharge. Left eye exhibits no discharge. No scleral icterus.  Neck: Normal range of motion and phonation normal. Neck supple. No neck rigidity. Normal range of motion present.  Cardiovascular: Normal rate, regular rhythm, normal heart sounds and intact distal pulses.   No murmur heard. Pulmonary/Chest: Effort normal and breath sounds normal. No stridor. No respiratory distress. He has no wheezes. He has no rales.  Abdominal: Soft. Bowel sounds are normal. He exhibits no distension. There is no tenderness. There is no rigidity, no rebound, no guarding and no CVA tenderness.    Musculoskeletal: Normal range of motion.  Lymphadenopathy:    He has no cervical adenopathy.  Neurological: He is alert. He is not disoriented. Coordination and gait normal. GCS eye subscore is 4. GCS verbal subscore is 5. GCS motor subscore is 6.  Skin: Skin is warm and dry. He is not diaphoretic.  Psychiatric: His affect is labile. He exhibits a depressed mood. He expresses suicidal ideation.   His mood is labile - he is tearful followed by inappropriate laughter.      ED Treatments / Results  Labs (all labs ordered are listed, but only abnormal results are displayed) Labs Reviewed  COMPREHENSIVE METABOLIC PANEL - Abnormal; Notable for the following:       Result Value   CO2 21 (*)    ALT 15 (*)    All other components within normal limits  ETHANOL - Abnormal; Notable for the following:    Alcohol, Ethyl (B) 222 (*)    All other components within normal limits  ACETAMINOPHEN LEVEL - Abnormal; Notable for the following:    Acetaminophen (Tylenol), Serum <10 (*)    All other components within normal limits  RAPID URINE DRUG SCREEN, HOSP PERFORMED - Abnormal; Notable for the following:    Tetrahydrocannabinol POSITIVE (*)    All other components within normal limits  SALICYLATE LEVEL  CBC  TROPONIN I  I-STAT TROPOININ, ED    EKG  EKG Interpretation None       Radiology Dg Chest 2 View  Result Date: 04/29/2016 CLINICAL DATA:  Acute onset of left-sided chest pain, cough and congestion. Initial encounter. EXAM: CHEST  2 VIEW COMPARISON:  Chest radiograph from 09/21/2015 FINDINGS: The lungs are well-aerated and clear. There is no evidence of focal opacification, pleural effusion or pneumothorax. The heart is normal in size; the mediastinal contour is within normal limits. No acute osseous abnormalities are seen. IMPRESSION: No acute cardiopulmonary process seen. Electronically Signed   By: Garald Balding M.D.   On: 04/29/2016 23:57    Procedures Procedures (including  critical care time)  Medications Ordered in ED Medications - No data to display   Initial Impression / Assessment and Plan / ED Course  I have reviewed the triage vital signs and the nursing notes.  Pertinent labs & imaging results that were available during my care of the patient were reviewed by me and considered in my medical decision making (see chart for details).  Clinical Course     Patient presents to ED with depression and suicidal ideation. Patient is afebrile and non-toxic appearing in NAD. Vital signs remarkable for slight tachycardia. Etoh elevated - to be expected given recent alcohol consumption; however, pt is clinically sober. UDS +THC. Acetaminophen and salicylate nml. CMP and CBC grossly nml. Troponin nml. CXR shows no acute cardiopulmonary process. EKG shows sinus arrhythmia. Low suspicion for ACS. Pt is medically cleared. Dispo pending TTS consult.     Final Clinical Impressions(s) / ED Diagnoses   Final diagnoses:  Suicidal ideation    New Prescriptions New Prescriptions   No medications on file     Roxanna Mew, PA-C 04/30/16 0251    Quintella Reichert, MD 05/01/16 [phone]

## 2016-04-29 NOTE — ED Notes (Signed)
Pt has been seen and wand by security.  Pt has 3 bags.

## 2016-04-29 NOTE — ED Triage Notes (Addendum)
Per [employer], patient was picked up at a gas station on [address] he was "mental".  States he got sad today after being around family for Thanksgiving and started drinking some. Denies SI/HI to [employer].  Endorses it on arrival.

## 2016-04-29 NOTE — ED Notes (Signed)
Patient transported to X-ray 

## 2016-04-29 NOTE — ED Notes (Signed)
ED Provider at bedside. 

## 2016-04-29 NOTE — ED Notes (Signed)
Pt states he feels suicidal due to stress about the holidays and his family. Pt states he was been drinking alcohol and smoking weed today. Pt states he would like to talk to someone and get help with depression.

## 2016-04-30 ENCOUNTER — Inpatient Hospital Stay (HOSPITAL_COMMUNITY)
Admission: AD | Admit: 2016-04-30 | Discharge: 2016-05-05 | DRG: 885 | Disposition: A | Discharge status: Home / Self Care | Payer: Medicaid Other | Source: Intra-hospital | Attending: Psychiatry | Admitting: Psychiatry

## 2016-04-30 ENCOUNTER — Encounter (HOSPITAL_COMMUNITY): Payer: Self-pay | Admitting: *Deleted

## 2016-04-30 DIAGNOSIS — K219 Gastro-esophageal reflux disease without esophagitis: Secondary | ICD-10-CM | POA: Diagnosis present

## 2016-04-30 DIAGNOSIS — F17210 Nicotine dependence, cigarettes, uncomplicated: Secondary | ICD-10-CM | POA: Diagnosis present

## 2016-04-30 DIAGNOSIS — F1020 Alcohol dependence, uncomplicated: Secondary | ICD-10-CM

## 2016-04-30 DIAGNOSIS — R45851 Suicidal ideations: Secondary | ICD-10-CM

## 2016-04-30 DIAGNOSIS — Y907 Blood alcohol level of 200-239 mg/100 ml: Secondary | ICD-10-CM | POA: Diagnosis present

## 2016-04-30 DIAGNOSIS — Z888 Allergy status to other drugs, medicaments and biological substances status: Secondary | ICD-10-CM

## 2016-04-30 DIAGNOSIS — Z915 Personal history of self-harm: Secondary | ICD-10-CM

## 2016-04-30 DIAGNOSIS — Z91018 Allergy to other foods: Secondary | ICD-10-CM | POA: Diagnosis not present

## 2016-04-30 DIAGNOSIS — F315 Bipolar disorder, current episode depressed, severe, with psychotic features: Secondary | ICD-10-CM | POA: Diagnosis not present

## 2016-04-30 DIAGNOSIS — F319 Bipolar disorder, unspecified: Secondary | ICD-10-CM | POA: Diagnosis present

## 2016-04-30 DIAGNOSIS — Z9889 Other specified postprocedural states: Secondary | ICD-10-CM | POA: Diagnosis not present

## 2016-04-30 DIAGNOSIS — F314 Bipolar disorder, current episode depressed, severe, without psychotic features: Principal | ICD-10-CM | POA: Diagnosis present

## 2016-04-30 DIAGNOSIS — Z79899 Other long term (current) drug therapy: Secondary | ICD-10-CM | POA: Diagnosis not present

## 2016-04-30 DIAGNOSIS — F10230 Alcohol dependence with withdrawal, uncomplicated: Secondary | ICD-10-CM | POA: Diagnosis present

## 2016-04-30 DIAGNOSIS — F3163 Bipolar disorder, current episode mixed, severe, without psychotic features: Secondary | ICD-10-CM | POA: Diagnosis not present

## 2016-04-30 DIAGNOSIS — Z886 Allergy status to analgesic agent status: Secondary | ICD-10-CM

## 2016-04-30 DIAGNOSIS — I2510 Atherosclerotic heart disease of native coronary artery without angina pectoris: Secondary | ICD-10-CM | POA: Diagnosis present

## 2016-04-30 MED ORDER — LORAZEPAM 1 MG PO TABS
1.0000 mg | ORAL_TABLET | Freq: Every day | ORAL | Status: AC
  Administered 2016-05-03: 1 mg via ORAL
  Filled 2016-04-30: qty 1

## 2016-04-30 MED ORDER — MAGNESIUM HYDROXIDE 400 MG/5ML PO SUSP: 30.0000 mL | Freq: Every day | ORAL | Status: DC | PRN

## 2016-04-30 MED ORDER — ADULT MULTIVITAMIN W/MINERALS CH
1.0000 | ORAL_TABLET | Freq: Every day | ORAL | Status: DC
  Administered 2016-04-30 – 2016-05-05 (×6): 1 via ORAL
  Filled 2016-04-30 (×10): qty 1

## 2016-04-30 MED ORDER — ALUM & MAG HYDROXIDE-SIMETH 200-200-20 MG/5ML PO SUSP
30.0000 mL | ORAL | Status: DC | PRN
  Administered 2016-05-04 (×2): 30 mL via ORAL
  Filled 2016-04-30 (×2): qty 30

## 2016-04-30 MED ORDER — LORAZEPAM 1 MG PO TABS
1.0000 mg | ORAL_TABLET | Freq: Two times a day (BID) | ORAL | Status: AC
  Administered 2016-05-02: 1 mg via ORAL
  Filled 2016-04-30: qty 1

## 2016-04-30 MED ORDER — LORAZEPAM 1 MG PO TABS
1.0000 mg | ORAL_TABLET | Freq: Four times a day (QID) | ORAL | Status: AC
  Administered 2016-04-30: 1 mg via ORAL
  Filled 2016-04-30: qty 1

## 2016-04-30 MED ORDER — SERTRALINE HCL 50 MG PO TABS
25.0000 mg | ORAL_TABLET | Freq: Every day | ORAL | Status: DC
  Administered 2016-04-30: 25 mg via ORAL
  Filled 2016-04-30: qty 1

## 2016-04-30 MED ORDER — SERTRALINE HCL 25 MG PO TABS
25.0000 mg | ORAL_TABLET | Freq: Every day | ORAL | Status: DC
  Administered 2016-05-01 – 2016-05-05 (×5): 25 mg via ORAL
  Filled 2016-04-30 (×9): qty 1

## 2016-04-30 MED ORDER — IBUPROFEN 200 MG PO TABS
400.0000 mg | ORAL_TABLET | Freq: Once | ORAL | Status: AC
  Administered 2016-04-30: 400 mg via ORAL
  Filled 2016-04-30: qty 2

## 2016-04-30 MED ORDER — QUETIAPINE FUMARATE 100 MG PO TABS
100.0000 mg | ORAL_TABLET | Freq: Every day | ORAL | Status: DC
  Administered 2016-05-03 – 2016-05-04 (×3): 100 mg via ORAL
  Filled 2016-04-30 (×7): qty 1

## 2016-04-30 MED ORDER — NICOTINE 21 MG/24HR TD PT24
21.0000 mg | MEDICATED_PATCH | Freq: Every day | TRANSDERMAL | Status: DC
  Administered 2016-04-30 – 2016-05-02 (×3): 21 mg via TRANSDERMAL
  Filled 2016-04-30 (×10): qty 1

## 2016-04-30 MED ORDER — LORAZEPAM 1 MG PO TABS
1.0000 mg | ORAL_TABLET | Freq: Three times a day (TID) | ORAL | Status: AC
  Administered 2016-05-01 (×3): 1 mg via ORAL
  Filled 2016-04-30 (×3): qty 1

## 2016-04-30 NOTE — Consult Note (Signed)
[name] Psychiat[facility] Consult   Reason for Consult:  Suicidal ideations with plan to overdose Referring Physician:  EDP Patient Identification: [name] [name] MRN:  [id number] Principal Diagnosis: Bipolar affective disorder, depressed, severe ([place]) Diagnosis:   Patient Active Problem List   Diagnosis Date Noted  . Bipolar affective disorder, depressed, severe ([place]) [F31.4] 11/22/2015    Priority: High  . Alcohol use disorder, severe, dependence ([place]) [F10.20] 01/18/2015    Priority: High  . Alcohol dependence ([place]) [F10.20] 10/15/2011    Priority: High  . Cocaine abuse with cocaine-induced mood disorder ([place]) [F14.14] 03/04/2016  . Alcohol dependence with uncomplicated withdrawal ([place]) [F10.230]   . Alcohol dependence with alcohol-induced mood disorder ([place]) [F10.24]   . Suicidal ideation [R45.851]   . Acute pancreatitis [K85.90] 03/20/2012  . Polysubstance abuse [F19.10] 03/20/2012  . Cocaine abuse [F14.10] 01/12/2012  . Cannabis abuse [F12.10] 01/12/2012  . Abnormal ECG [R94.31]   . Chest pain [R07.89]   . CAD in native artery [I25.10]   . GERD (gastroesophageal reflux disease) [K21.9]   . Tobacco abuse [Z72.0]   . History of cocaine abuse [Z87.898]   . History of ETOH abuse [Z87.898]   . Bradycardia [R00.1]   . Syncope [R55]     Total Time spent with patient: 45 minutes  Subjective:   [name] [name] is a 50 y.o. male patient admitted with suicide plan.  HPI:  50 yo male who presents to the ED with suicidal ideations and plan to overdose.  He minimizes his drinking and reports he does not have withdrawal symptoms when he is not drinking.  Stressed at home where he lives with his parents but his nieces and nephews have been staying there and "I am easily agitated" because he has not been on his medications.  He feels he needs to be started on medications and stabilized as he continues to endorse suicidal ideations.  No hallucinations or homicidal ideations.    Past  Psychiatric History: bipolar affective disorder, substance abuse  Risk to Self: Suicidal Ideation: Yes-Currently Present Suicidal Intent: No Is patient at risk for suicide?: Yes Suicidal Plan?: No Access to Means: No What has been your use of drugs/alcohol within the last 12 months?: Pt reported drinking alcohol and smoking marijuana today.  How many times?: 2 Other Self Harm Risks: Pt denies.  Triggers for Past Attempts: Family contact Intentional Self Injurious Behavior: None (Pt denies. ) Comment - Self Injurious Behavior: Pt denies.  Risk to Others: Homicidal Ideation: No (Pt denies.) Thoughts of Harm to Others: No (Pt denies. ) Comment - Thoughts of Harm to Others: NA Current Homicidal Intent: No (Pt denies. ) Current Homicidal Plan: No Access to Homicidal Means: No Identified Victim: NA History of harm to others?: Yes Assessment of Violence: None Noted Violent Behavior Description: NA Does patient have access to weapons?: No (Pt denies. ) Criminal Charges Pending?: No Does patient have a court date: No Prior Inpatient Therapy: Prior Inpatient Therapy: Yes Prior Therapy Dates: 2011, 2013, 2016, 2017 Prior Therapy Facilty/Provider(s): [facility], [facility], [facility] Reason for Treatment: Alcohol dependency, SI, depression Prior Outpatient Therapy: Prior Outpatient Therapy: Yes Prior Therapy Dates: Pt reported its been a while.  Prior Therapy Facilty/Provider(s): Dr. Suszanne Finch Reason for Treatment: medication management.  Does patient have an ACCT team?: Unknown Does patient have Intensive In-House Services?  : Unknown Does patient have [facility] services? : Unknown Does patient have P4CC services?: Unknown  Past Medical History:  Past Medical History:  Diagnosis Date  .  Abnormal ECG    a. early repolarization  . Arthritis    "my whole left side" (05/03/2014)  . Bipolar disorder ([facility])   . Bradycardia    a. asymptomatic  . CAD in native artery    a. Nonobstructive  cath 11/2007;  b. Presented with ST elevation - Nonobstructive cath 08/2011  . Chest pain, mid sternal   . Coronary artery disease   . GERD (gastroesophageal reflux disease)   . History of cocaine abuse    a. quit ? 2009  . History of ETOH abuse    a. drinks 2 "40's" / wk  . Marijuana abuse    a. uses ~ 1x /wk or less  . Pneumonia   . Stomach ulcer   . Syncope    a. 12/2010 - presumed to be vasovagal  . Tobacco abuse     Past Surgical History:  Procedure Laterality Date  . CARDIAC CATHETERIZATION  2009; 08/2011   /notes 08/06/2011  . LEFT HEART CATHETERIZATION WITH CORONARY ANGIOGRAM N/A 08/16/2011   Procedure: LEFT HEART CATHETERIZATION WITH CORONARY ANGIOGRAM;  Surgeon: Jayadeep S Varanasi, MD;  Location: [facility] CATH LAB;  Service: Cardiovascular;  Laterality: N/A;   Family History: No family history on file. Family Psychiatric  History: none Social History:  History  Alcohol Use  . 28.8 oz/week  . 48 Cans of beer per week    Comment: none for 2-3 weeks     History  Drug Use  . Types: Marijuana, Cocaine    Social History   Social History  . Marital status: Single    Spouse name: N/A  . Number of children: N/A  . Years of education: N/A   Social History Main Topics  . Smoking status: Current Some Day Smoker    Packs/day: 0.50    Years: 20.00    Types: Cigarettes    Last attempt to quit: 06/24/2015  . Smokeless tobacco: Never Used  . Alcohol use 28.8 oz/week    48 Cans of beer per week     Comment: none for 2-3 weeks  . Drug use:     Types: Marijuana, Cocaine  . Sexual activity: Yes   Other Topics Concern  . None   Social History Narrative   Lives with his daughter currently and receives SSI.    Additional Social History:    Allergies:   Allergies  Allergen Reactions  . Aspirin     Aggravates ulcer. "Causes chest pain."  . Pepperoni [Pickled Meat] Other (See Comments)    aggrevates ulcer  . Tomato Other (See Comments)    Foods with tomato sauce aggrevate  ulcers  . Tylenol [Acetaminophen]     Pt reports hx of ulcers    Labs:  Results for orders placed or performed during the hospital encounter of 04/29/16 (from the past 48 hour(s))  Comprehensive metabolic panel     Status: Abnormal   Collection Time: 04/29/16 10:33 PM  Result Value Ref Range   Sodium 138 135 - 145 mmol/L   Potassium 3.8 3.5 - 5.1 mmol/L   Chloride 107 101 - 111 mmol/L   CO2 21 (L) 22 - 32 mmol/L   Glucose, Bld 99 65 - 99 mg/dL   BUN 7 6 - 20 mg/dL   Creatinine, Ser 1.10 0.61 - 1.24 mg/dL   Calcium 9.1 8.9 - 10.3 mg/dL   Total Protein 8.0 6.5 - 8.1 g/dL   Albumin 4.7 3.5 - 5.0 g/dL   AST 27 15 - 41   U/L   ALT 15 (L) 17 - 63 U/L   Alkaline Phosphatase 71 38 - 126 U/L   Total Bilirubin 0.9 0.3 - 1.2 mg/dL   GFR calc non Af Amer >60 >60 mL/min   GFR calc Af Amer >60 >60 mL/min    Comment: (NOTE) The eGFR has been calculated using the CKD EPI equation. This calculation has not been validated in all clinical situations. eGFR's persistently <60 mL/min signify possible Chronic Kidney Disease.    Anion gap 10 5 - 15  Ethanol     Status: Abnormal   Collection Time: 04/29/16 10:33 PM  Result Value Ref Range   Alcohol, Ethyl (B) 222 (H) <5 mg/dL    Comment:        LOWEST DETECTABLE LIMIT FOR SERUM ALCOHOL IS 5 mg/dL FOR MEDICAL PURPOSES ONLY   Salicylate level     Status: None   Collection Time: 04/29/16 10:33 PM  Result Value Ref Range   Salicylate Lvl <7.0 2.8 - 30.0 mg/dL  Acetaminophen level     Status: Abnormal   Collection Time: 04/29/16 10:33 PM  Result Value Ref Range   Acetaminophen (Tylenol), Serum <10 (L) 10 - 30 ug/mL    Comment:        THERAPEUTIC CONCENTRATIONS VARY SIGNIFICANTLY. A RANGE OF 10-30 ug/mL MAY BE AN EFFECTIVE CONCENTRATION FOR MANY PATIENTS. HOWEVER, SOME ARE BEST TREATED AT CONCENTRATIONS OUTSIDE THIS RANGE. ACETAMINOPHEN CONCENTRATIONS >150 ug/mL AT 4 HOURS AFTER INGESTION AND >50 ug/mL AT 12 HOURS AFTER INGESTION  ARE OFTEN ASSOCIATED WITH TOXIC REACTIONS.   cbc     Status: None   Collection Time: 04/29/16 10:33 PM  Result Value Ref Range   WBC 7.2 4.0 - 10.5 K/uL   RBC 4.54 4.22 - 5.81 MIL/uL   Hemoglobin 14.2 13.0 - 17.0 g/dL   HCT 40.2 39.0 - 52.0 %   MCV 88.5 78.0 - 100.0 fL   MCH 31.3 26.0 - 34.0 pg   MCHC 35.3 30.0 - 36.0 g/dL   RDW 13.5 11.5 - 15.5 %   Platelets 236 150 - 400 K/uL  Troponin I     Status: None   Collection Time: 04/29/16 10:33 PM  Result Value Ref Range   Troponin I <0.03 <0.03 ng/mL  Rapid urine drug screen (hospital performed)     Status: Abnormal   Collection Time: 04/29/16 10:52 PM  Result Value Ref Range   Opiates NONE DETECTED NONE DETECTED   Cocaine NONE DETECTED NONE DETECTED   Benzodiazepines NONE DETECTED NONE DETECTED   Amphetamines NONE DETECTED NONE DETECTED   Tetrahydrocannabinol POSITIVE (A) NONE DETECTED   Barbiturates NONE DETECTED NONE DETECTED    Comment:        DRUG SCREEN FOR MEDICAL PURPOSES ONLY.  IF CONFIRMATION IS NEEDED FOR ANY PURPOSE, NOTIFY LAB WITHIN 5 DAYS.        LOWEST DETECTABLE LIMITS FOR URINE DRUG SCREEN Drug Class       Cutoff (ng/mL) Amphetamine      1000 Barbiturate      200 Benzodiazepine   200 Tricyclics       300 Opiates          300 Cocaine          300 THC              50     No current facility-administered medications for this encounter.    Current Outpatient Prescriptions  Medication Sig Dispense Refill  . ranitidine (ZANTAC) 75   MG tablet Take 75 mg by mouth 2 (two) times daily.    . nicotine (NICODERM CQ - DOSED IN MG/24 HOURS) 21 mg/24hr patch Place 1 patch (21 mg total) onto the skin daily. (Patient not taking: Reported on 04/29/2016) 28 patch 0  . QUEtiapine (SEROQUEL) 100 MG tablet Take 1 tablet (100 mg total) by mouth at bedtime. (Patient not taking: Reported on 04/29/2016) 30 tablet 0  . sertraline (ZOLOFT) 25 MG tablet Take 1 tablet (25 mg total) by mouth daily. (Patient not taking: Reported on  04/29/2016) 30 tablet 0    Musculoskeletal: Strength & Muscle Tone: within normal limits Gait & Station: normal Patient leans: N/A  Psychiatric Specialty Exam: Physical Exam  Constitutional: He is oriented to person, place, and time. He appears well-developed and well-nourished.  HENT:  Head: Normocephalic.  Neck: Normal range of motion.  Respiratory: Effort normal.  Musculoskeletal: Normal range of motion.  Neurological: He is alert and oriented to person, place, and time.  Skin: Skin is warm and dry.  Psychiatric: His speech is normal and behavior is normal. Judgment normal. Cognition and memory are normal. He exhibits a depressed mood. He expresses suicidal ideation. He expresses suicidal plans.    Review of Systems  Constitutional: Negative.   HENT: Negative.   Eyes: Negative.   Respiratory: Negative.   Cardiovascular: Negative.   Gastrointestinal: Negative.   Genitourinary: Negative.   Musculoskeletal: Negative.   Skin: Negative.   Neurological: Negative.   Endo/Heme/Allergies: Negative.   Psychiatric/Behavioral: Positive for depression, substance abuse and suicidal ideas.    Blood pressure 101/57, pulse 66, temperature 98 F (36.7 C), temperature source Oral, resp. rate 17, SpO2 97 %.There is no height or weight on file to calculate BMI.  General Appearance: Casual  Eye Contact:  Fair  Speech:  Normal Rate  Volume:  Decreased  Mood:  Depressed  Affect:  Congruent  Thought Process:  Coherent and Descriptions of Associations: Intact  Orientation:  Full (Time, Place, and Person)  Thought Content:  Rumination  Suicidal Thoughts:  Yes.  with intent/plan  Homicidal Thoughts:  No  Memory:  Immediate;   Fair Recent;   Fair Remote;   Fair  Judgement:  Fair  Insight:  Fair  Psychomotor Activity:  Decreased  Concentration:  Concentration: Fair and Attention Span: Fair  Recall:  [employer] of Knowledge:  Fair  Language:  Good  Akathisia:  No  Handed:  Right  AIMS  (if indicated):     Assets:  Housing Leisure Time Physical Health Resilience Social Support  ADL's:  Intact  Cognition:  WNL  Sleep:        Treatment Plan Summary: Daily contact with patient to assess and evaluate symptoms and progress in treatment, Medication management and Plan bipolar affective disorder, depressed, severe without psychosis:  -Crisis stabilization -Medication management:  Seroquel 100 mg at bedtime for mood stabilization and Zoloft 25 mg daily for depression started -Individual and substance abuse counseling  Disposition: Recommend psychiatric Inpatient admission when medically cleared.  Waylan Boga, NP 04/30/2016 11:40 AM   Patient seen, chart reviewed, case discussed with treatment team and physician extender and formulated treatment plan. Reviewed the information documented and agree with the treatment plan.  Teila Skalsky [facility] 05/01/2016 9:48 AM
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
## 2016-04-30 NOTE — BH Assessment (Signed)
[facility] Assessment Progress Note  Per Ambrose Finland, MD, this pt requires psychiatric hospitalization at this time.  Ria Comment, RN, [facility] has assigned pt to [facility] Rm [id number]; they will be ready to receive pt at 14:00.  Pt has signed Voluntary Admission and Consent for Treatment, as well as Consent to Release Information to [facility], and a notification call has been placed.  Signed forms have been faxed to [facility].  Pt's nurse has been notified, and agrees to send original paperwork along with pt via [place], and to call report to [phone].  Jalene Mullet, [place] Triage Specialist [phone]

## 2016-04-30 NOTE — ED Notes (Signed)
TTS at bedside. 

## 2016-04-30 NOTE — Tx Team (Signed)
Initial Treatment Plan 04/30/2016 3:01 PM [name] [name] [id number]    PATIENT STRESSORS: Medication change or noncompliance Substance abuse   PATIENT STRENGTHS: General fund of knowledge Motivation for treatment/growth   PATIENT IDENTIFIED PROBLEMS: "Depressed over the holiday"    Depression    Substance abuse    "drinking 4 to 5 40's daily, plus THC"         DISCHARGE CRITERIA:  Improved stabilization in mood, thinking, and/or behavior Need for constant or close observation no longer present  PRELIMINARY DISCHARGE PLAN: Placement in alternative living arrangements  PATIENT/FAMILY INVOLVEMENT: This treatment plan has been presented to and reviewed with the patient, [name] [name], and/or family member.  The patient and family have been given the opportunity to ask questions and make suggestions.  Ventura Sellers, RN 04/30/2016, 3:01 PM

## 2016-04-30 NOTE — Progress Notes (Signed)
Psychoeducational Group Note  Date:  04/30/2016 Time:  2314  Group Topic/Focus:  Wrap-Up Group:   The focus of this group is to help patients review their daily goal of treatment and discuss progress on daily workbooks.   Participation Level: Did Not Attend  Participation Quality:  Not Applicable  Affect:  Not Applicable  Cognitive:  Not Applicable  Insight:  Not Applicable  Engagement in Group: Not Applicable  Additional Comments:  The patient did not attend the Wrap- Up group this evening since he was asleep in his bed.  Archie Balboa S 04/30/2016, 11:14 PM

## 2016-04-30 NOTE — Progress Notes (Signed)
Patient ID: GARSON DEGNAN, male   DOB: 1965-06-26, 50 y.o.   MRN: [id number]     50 year old black male admitted after he presented to [facility] reporting SI due to the holidays. Pt also requested detox from alcohol, he reported that he had been drinking 4 to 5 40's daily for some time. Pt reported that he had also been smoking THC daily. Pt reported that he just wanted to die as he was depressed that the holidays were here and yet he still felt alone. Pt reported that he was a [facility] in September or October. Pt reported that he had been fine until Thanksgiving came. Pt reported that his depression was a 8, his hopelessness was a 10, and that his anxiety was a 10. Pt reported that when he is discharged he wanted to be discharged to long term treatment. No other issues or concerns noted.

## 2016-04-30 NOTE — ED Notes (Signed)
Patient requested to speak with PA. PA made aware

## 2016-04-30 NOTE — ED Notes (Signed)
Belongings moved to locker 29.

## 2016-05-01 DIAGNOSIS — Z79899 Other long term (current) drug therapy: Secondary | ICD-10-CM

## 2016-05-01 DIAGNOSIS — Z91018 Allergy to other foods: Secondary | ICD-10-CM

## 2016-05-01 DIAGNOSIS — F319 Bipolar disorder, unspecified: Secondary | ICD-10-CM

## 2016-05-01 DIAGNOSIS — Z888 Allergy status to other drugs, medicaments and biological substances status: Secondary | ICD-10-CM

## 2016-05-01 MED ORDER — LOPERAMIDE HCL 2 MG PO CAPS
2.0000 mg | ORAL_CAPSULE | ORAL | Status: DC | PRN
  Administered 2016-05-01: 2 mg via ORAL

## 2016-05-01 MED ORDER — LOPERAMIDE HCL 2 MG PO CAPS
ORAL_CAPSULE | ORAL | Status: AC
  Filled 2016-05-01: qty 1
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
## 2016-05-01 NOTE — Progress Notes (Signed)
D: Pt is flat isolative and withdrawn to room; Pt was in bed all evening. Pt endorses moderate depression and anxiety. Pt denies pain, SI, HI or AVH. Pt at the time of assessment does not look to be in any distress. A:  Support, encouragement, and safe environment provided.  15-minute safety checks continue. R: Pt was med compliant.  Pt did not attend wrap-up group. Safety checks continue.

## 2016-05-01 NOTE — Progress Notes (Signed)
Patient did not attend the evening speaker AA meeting. Pt was notified that group was beginning but remained in bed.   

## 2016-05-01 NOTE — BHH Group Notes (Signed)
[place] Group Notes:  (Nursing/MHT/Case Management/Adjunct)  Date:  05/01/2016  Time:  7:27 PM  Type of Therapy:  Nurse Education  Participation Level:  Did Not Attend  Participation Quality:  Modes of Intervention:  Summary of Progress/Problems:  Randa Spike 05/01/2016, 7:27 PM
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
## 2016-05-01 NOTE — Plan of Care (Signed)
Problem: Activity: Goal: Interest or engagement in leisure activities will improve Outcome: Not Progressing Patient isolated in his room this shift, except for meals.

## 2016-05-01 NOTE — BHH Group Notes (Signed)
[place] Group Notes: (Clinical Social Work)   05/01/2016      Type of Therapy:  Group Therapy   Participation Level:  Did Not Attend despite MHT prompting   Selmer Dominion, LCSW 05/01/2016, 1:20 PM

## 2016-05-01 NOTE — BHH Group Notes (Signed)
[place] Group Notes:  (Nursing/MHT/Case Management/Adjunct)  Date:  05/01/2016  Time:  7:25 PM  Type of Therapy:  Nurse Education  Participation Level:  Did Not Attend  Participation Quality: Modes of Intervention:   Summary of Progress/Problems:  [name] [name] 05/01/2016, 7:25 PM

## 2016-05-01 NOTE — Progress Notes (Signed)
Data. Patient denies SI/HI/AVH. Patient Spent most of shift in bed. he did go down to meals. C/O diarrhea and an order received from NP. Affe t is flat, but does brighten on interaction. Fluids have been encouraged and there is a water jug at bedside. On his self assessment patient reports 7/10 for depression, 4/10 for hopelessness and 0/10 for anxiety. His goal for today is: "work on being happy".  Action. Emotional support and encouragement offered. Education provided on medication, indications and side effect. Q 15 minute checks done for safety. Response. Safety on the unit maintained through 15 minute checks.  Medications taken as prescribed. Remained calm and appropriate through out shift.

## 2016-05-01 NOTE — Progress Notes (Signed)
D.  Pt remained in bed on approach, did not get up for evening AA group.  Pt has remained in room this shift, no interaction on unit.  Pt denies complaints at this time, denies SI/HI/hallucinations.  A.  Support and encouragement offered, medication given as ordered  R.  Pt remains safe on unit, will continue to monitor.

## 2016-05-02 DIAGNOSIS — F17210 Nicotine dependence, cigarettes, uncomplicated: Secondary | ICD-10-CM

## 2016-05-02 DIAGNOSIS — F3163 Bipolar disorder, current episode mixed, severe, without psychotic features: Secondary | ICD-10-CM

## 2016-05-02 NOTE — Plan of Care (Signed)
Problem: Safety: Goal: Periods of time without injury will increase Outcome: Progressing Patient has not engaged in self harm, denies SI.  Problem: Medication: Goal: Compliance with prescribed medication regimen will improve Outcome: Progressing Patient is med compliant.   

## 2016-05-02 NOTE — BHH Suicide Risk Assessment (Signed)
[place] INPATIENT:  Family/Significant Other Suicide Prevention Education  Suicide Prevention Education:  Patient Refusal for Family/Significant Other Suicide Prevention Education: The patient [name] [name] has refused to provide written consent for family/significant other to be provided Family/Significant Other Suicide Prevention Education during admission and/or prior to discharge.  Physician notified.  Writer provided suicide prevention education directly to patient; conversation included risk factors, warning signs and resources to contact for help. Mobile crisis services explained and  explanations given as to resources which will be included on patient's discharge paperwork.   Sheilah Pigeon 05/02/2016, 10:15 AM

## 2016-05-02 NOTE — BHH Group Notes (Signed)
Adult Therapy Group Note  Date: 05/02/2016  Time:  10:00-11:00AM  Group Topic/Focus: Healthy Support Systems   Building Self Esteem:   The focus of this group was to assist patients in identifying their current healthy supports and unhealthy supports, then discussing how to add additional healthy supports and reduce existing unhealthy ones.  Cards with names of possible healthy supports on them were divided up and shared by group members, The healthy additions included supports such as 12-step groups, individual therapy, psychiatrists, faith activities,  sponsors, group therapy, support groups, classes on mental health, and more.    Participation Level:  Active  Participation Quality:  Attentive and Sharing  Affect:  Appropriate  Cognitive:  Not assessed  Insight: Poor to Fair  Engagement in Group: Improving  Modes of Intervention:  Discussion and Support  Additional Comments:  The patient expressed that a current healthy support is his 36 grandchildren, his mother and his father.  He stated that his family is not healthy for his recovery.  Maretta Los, LCSW 05/02/2016  2:46 PM

## 2016-05-02 NOTE — Progress Notes (Signed)
D.  Pt in bed for duration of shift, did not get up for evening  AA group.  Isolative and minimal interaction on unit.  A.  Support and encouragement offered  R. Pt remains safe on the unit, will continue to monitor.

## 2016-05-02 NOTE — Plan of Care (Signed)
Problem: Activity: Goal: Interest or engagement in activities will improve Outcome: Not Progressing Pt did not attend evening AA group

## 2016-05-02 NOTE — Progress Notes (Signed)
[facility] MD Progress Note  05/02/2016 11:20 AM [name] [name]  MRN:  [id number] Subjective:  Patient reports " I am feeling okay" smiling with questions  Objective: KAMARIUS [name] is awake, alert and oriented *3,seen resting in bedroom. Denies suicidal or homicidal ideation. Denies auditory or visual hallucination and does not appear to be responding to internal stimuli.   Patient reports he is medication compliant without mediation side effects. States his depression 2/10. Patient states "I am feeling okay today, I just want to rest" Per staff notes patient is isolative, withdrawn and guarded  to room. Reports good appetite and reports resting well. Support, encouragement and reassurance was provided.   Principal Problem: Affective psychosis, bipolar ([place]) Diagnosis:   Patient Active Problem List   Diagnosis Date Noted  . Affective psychosis, bipolar ([place]) [F31.9] 04/30/2016  . Cocaine abuse with cocaine-induced mood disorder ([place]) [F14.14] 03/04/2016  . Alcohol dependence with uncomplicated withdrawal ([place]) [F10.230]   . Bipolar affective disorder, depressed, severe ([place]) [F31.4] 11/22/2015  . Alcohol use disorder, severe, dependence ([place]) [F10.20] 01/18/2015  . Alcohol dependence with alcohol-induced mood disorder ([place]) [F10.24]   . Suicidal ideation [R45.851]   . Acute pancreatitis [K85.90] 03/20/2012  . Polysubstance abuse [F19.10] 03/20/2012  . Cocaine abuse [F14.10] 01/12/2012  . Cannabis abuse [F12.10] 01/12/2012  . Alcohol dependence ([place]) [F10.20] 10/15/2011  . Abnormal ECG [R94.31]   . Chest pain [R07.89]   . CAD in native artery [I25.10]   . GERD (gastroesophageal reflux disease) [K21.9]   . Tobacco abuse [Z72.0]   . History of cocaine abuse [Z87.898]   . History of ETOH abuse [Z87.898]   . Bradycardia [R00.1]   . Syncope [R55]    Total Time spent with patient: 30 minutes Past Psychiatric History:   Past Medical History:  Past Medical History:  Diagnosis Date  .  Abnormal ECG    a. early repolarization  . Arthritis    "my whole left side" (05/03/2014)  . Bipolar disorder ([place])   . Bradycardia    a. asymptomatic  . CAD in native artery    a. Nonobstructive cath 11/2007;  b. Presented with ST elevation - Nonobstructive cath 08/2011  . Chest pain, mid sternal   . Coronary artery disease   . GERD (gastroesophageal reflux disease)   . History of cocaine abuse    a. quit ? 2009  . History of ETOH abuse    a. drinks 2 "40's" / wk  . Marijuana abuse    a. uses ~ 1x /wk or less  . Pneumonia   . Stomach ulcer   . Syncope    a. 12/2010 - presumed to be vasovagal  . Tobacco abuse     Past Surgical History:  Procedure Laterality Date  . CARDIAC CATHETERIZATION  2009; 08/2011   [name] 08/06/2011  . LEFT HEART CATHETERIZATION WITH CORONARY ANGIOGRAM N/A 08/16/2011   Procedure: LEFT HEART CATHETERIZATION WITH CORONARY ANGIOGRAM;  Surgeon: [name], MD;  Location: [name] CATH LAB;  Service: Cardiovascular;  Laterality: N/A;   Family History: History reviewed. No pertinent family history. Family Psychiatric  History: Social History:  History  Alcohol Use  . 28.8 oz/week  . 48 Cans of beer per week    Comment: none for 2-3 weeks     History  Drug Use  . Types: Marijuana, Cocaine    Social History   Social History  . Marital status: Single    Spouse name: N/A  . Number of children: N/A  .  Years of education: N/A   Social History Main Topics  . Smoking status: Current Some Day Smoker    Packs/day: 0.50    Years: 20.00    Types: Cigarettes    Last attempt to quit: 06/24/2015  . Smokeless tobacco: Never Used  . Alcohol use 28.8 oz/week    48 Cans of beer per week     Comment: none for 2-3 weeks  . Drug use:     Types: Marijuana, Cocaine  . Sexual activity: Yes   Other Topics Concern  . None   Social History Narrative   Lives with his daughter currently and receives SSI.    Additional Social History:    Pain Medications:  none Prescriptions: none Over the Counter: none History of alcohol / drug use?: Yes Negative Consequences of Use: Personal relationships Withdrawal Symptoms: Irritability, Tremors                    Sleep: Fair  Appetite:  Fair  Current Medications: Current Facility-Administered Medications  Medication Dose Route Frequency Provider Last Rate Last Dose  . alum & mag hydroxide-simeth (MAALOX/MYLANTA) 200-200-20 MG/5ML suspension 30 mL  30 mL Oral Q4H PRN [name], NP      . loperamide (IMODIUM) capsule 2 mg  2 mg Oral PRN Derrill Center, NP   2 mg at 05/01/16 1545  . LORazepam (ATIVAN) tablet 1 mg  1 mg Oral BID [name], NP   1 mg at 05/02/16 0841   Followed by  . [START ON 05/03/2016] LORazepam (ATIVAN) tablet 1 mg  1 mg Oral Daily [name], NP      . magnesium hydroxide (MILK OF MAGNESIA) suspension 30 mL  30 mL Oral Daily PRN [name], NP      . multivitamin with minerals tablet 1 tablet  1 tablet Oral Daily [name], NP   1 tablet at 05/02/16 0841  . nicotine (NICODERM CQ - dosed in mg/24 hours) patch 21 mg  21 mg Transdermal Daily [name], NP   21 mg at 05/02/16 0843  . QUEtiapine (SEROQUEL) tablet 100 mg  100 mg Oral QHS [name], NP      . sertraline (ZOLOFT) tablet 25 mg  25 mg Oral Daily [name], NP   25 mg at 05/02/16 [id number]    Lab Results: No results found for this or any previous visit (from the past 98 hour(s)).  Blood Alcohol level:  Lab Results  Component Value Date   ETH 222 (H) 04/29/2016   ETH 203 (H) 0000000    Metabolic Disorder Labs: Lab Results  Component Value Date   HGBA1C 4.5 05/03/2014   MPG 82 05/03/2014   MPG 100 08/16/2011   No results found for: PROLACTIN Lab Results  Component Value Date   CHOL 159 05/03/2014   TRIG 97 05/03/2014   HDL 98 05/03/2014   CHOLHDL 1.6 05/03/2014   VLDL 19 05/03/2014   LDLCALC 42 05/03/2014   LDLCALC 75 03/20/2012    Physical Findings: AIMS:  , ,   ,  ,    CIWA:  CIWA-Ar Total: 0 COWS:  COWS Total Score: 0  Musculoskeletal: Strength & Muscle Tone: within normal limits Gait & Station: normal Patient leans: Right  Psychiatric Specialty Exam: Physical Exam  Nursing note and vitals reviewed. Constitutional: He is oriented to person, place, and time. He appears well-developed.  Neurological: He is alert and oriented to person,  place, and time.  Psychiatric: He has a normal mood and affect. His behavior is normal.    Review of Systems  Psychiatric/Behavioral: Positive for depression and hallucinations. The patient is nervous/anxious.     Blood pressure 120/73, pulse 75, temperature 98.4 F (36.9 C), temperature source Oral, resp. rate 16, height 5\' 4"  (1.626 m), weight 49.9 kg (110 lb).Body mass index is 18.88 kg/m.  General Appearance: Fairly Groomed  Eye Contact:  Fair  Speech:  Clear and Coherent  Volume:  Normal  Mood:  Anxious  Affect:  Congruent  Thought Process:  Coherent  Orientation:  Full (Time, Place, and Person)  Thought Content:  Hallucinations: None  Suicidal Thoughts:  No  Homicidal Thoughts:  No  Memory:  Immediate;   Fair Recent;   Fair Remote;   Fair  Judgement:  Fair  Insight:  Fair  Psychomotor Activity:  Normal  Concentration:  Concentration: Poor  Recall:  [employer] of Knowledge:  Fair  Language:  Fair  Akathisia:  No  Handed:  Right  AIMS (if indicated):     Assets:  Communication Skills Desire for Improvement Transportation  ADL's:  Intact  Cognition:  WNL  Sleep:  Number of Hours: 5.75     I agree with current treatment plan on 05/02/2016, Patient seen face-to-face for psychiatric evaluation follow-up, chart reviewed. Reviewed the information documented and agree with the treatment plan.  Treatment Plan Summary: Daily contact with patient to assess and evaluate symptoms and progress in treatment and Medication management  Continue with Zoloft 25 mg and Seroquel 100 mg for mood  stabilization. Continue with Trazodone 100 mg for insomnia Started on CWIA/Ativan Protocol Will continue to monitor vitals ,medication compliance and treatment side effects while patient is here.  Reviewed labs:,BAL - 222, UDS - positive for cocaine, thc  CSW will start working on disposition.  Patient to participate in therapeutic milieu    Derrill Center, NP 05/02/2016, 11:20 AM

## 2016-05-02 NOTE — BHH Group Notes (Signed)
[facility] Group Notes:  (Nursing/MHT/Case Management/Adjunct)  Date:  05/02/2016  Time:  1315   Type of Therapy:  Nurse Education  Participation Level:  Did Not Attend  Participation Quality:    Affect:    Cognitive:    Insight:    Engagement in Group:    Modes of Intervention:    Summary of Progress/Problems: Patient was invited to group however elected to remain in bed.  Loletta Specter [facility] 05/02/2016, 6:14 PM

## 2016-05-02 NOTE — Progress Notes (Signed)
Patient did not attend the evening speaker AA meeting. Pt was notified that group was beginning but remained in bed.   

## 2016-05-02 NOTE — Progress Notes (Addendum)
D: Patient has been isolative to bed, room all day. Reports this is how he feels when his depression takes hold. Reporting decreased appetite which is also consistent with his mood changes. Patient's affect flat, sad and mood depressed. Eye contact brief. Denies pain, physical problems. Denies withdrawal symptoms.   A: Medicated per orders, no prns requested, needed. Emotional support offered and offered to assist with completion of self inventory. Also encouraged completion of Suicide Safety Plan. Discussed POC with MD, SW.  Burna Cash of fluids provided and encouraged, as well as nourishment.   R: Patient verbalizes understanding of POC however remains isolative. Patient denies SI/HI and remains safe on level III obs.   Add note: Patient did go to dinner with peers and was observed to eat about 50% of his meal, took in fluids. Patient presently sitting in dayroom.
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
## 2016-05-03 DIAGNOSIS — F315 Bipolar disorder, current episode depressed, severe, with psychotic features: Secondary | ICD-10-CM

## 2016-05-03 NOTE — Tx Team (Signed)
Interdisciplinary Treatment and Diagnostic Plan Update  05/03/2016 Time of Session: 9:30AM [name] [name] MRN: [id number]  Principal Diagnosis: Affective psychosis, bipolar ([place])  Secondary Diagnoses: Principal Problem:   Affective psychosis, bipolar ([place])   Current Medications:  Current Facility-Administered Medications  Medication Dose Route Frequency Provider Last Rate Last Dose  . alum & mag hydroxide-simeth (MAALOX/MYLANTA) 200-200-20 MG/5ML suspension 30 mL  30 mL Oral Q4H PRN [name], NP      . loperamide (IMODIUM) capsule 2 mg  2 mg Oral PRN Derrill Center, NP   2 mg at 05/01/16 1545  . magnesium hydroxide (MILK OF MAGNESIA) suspension 30 mL  30 mL Oral Daily PRN [name], NP      . multivitamin with minerals tablet 1 tablet  1 tablet Oral Daily [name], NP   1 tablet at 05/03/16 0835  . nicotine (NICODERM CQ - dosed in mg/24 hours) patch 21 mg  21 mg Transdermal Daily [name], NP   21 mg at 05/02/16 0843  . QUEtiapine (SEROQUEL) tablet 100 mg  100 mg Oral QHS [name], NP   100 mg at 05/03/16 0104  . sertraline (ZOLOFT) tablet 25 mg  25 mg Oral Daily [name], NP   25 mg at 05/03/16 [id number]   PTA Medications: Prescriptions Prior to Admission  Medication Sig Dispense Refill Last Dose  . nicotine (NICODERM CQ - DOSED IN MG/24 HOURS) 21 mg/24hr patch Place 1 patch (21 mg total) onto the skin daily. (Patient not taking: Reported on 04/30/2016) 28 patch 0 Unknown at Unknown time  . QUEtiapine (SEROQUEL) 100 MG tablet Take 1 tablet (100 mg total) by mouth at bedtime. (Patient not taking: Reported on 04/30/2016) 30 tablet 0 Unknown at Unknown time  . ranitidine (ZANTAC) 75 MG tablet Take 75 mg by mouth 2 (two) times daily.   Unknown at Unknown time  . sertraline (ZOLOFT) 25 MG tablet Take 1 tablet (25 mg total) by mouth daily. (Patient not taking: Reported on 04/30/2016) 30 tablet 0 Unknown at Unknown time    Patient Stressors: Medication change  or noncompliance Substance abuse  Patient Strengths: General fund of knowledge Motivation for treatment/growth  Treatment Modalities: Medication Management, Group therapy, Case management,  1 to 1 session with clinician, Psychoeducation, Recreational therapy.   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  Therapeutic Interventions: 1 to 1 sessions, Unit Group sessions and Medication administration.  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  Therapeutic Interventions: 1 to 1 sessions, Unit Group sessions and Medication administration.  Evaluation of Outcomes: Progressing   RN Treatment Plan for Primary Diagnosis: Affective psychosis, bipolar ([name]) Long Term Goal(s): Knowledge of disease  and therapeutic regimen to maintain health will improve  Short Term Goals: Ability to remain free from injury will improve, Ability to disclose and discuss suicidal ideas and Ability to identify and develop effective coping behaviors will improve  Medication Management: RN will administer medications as ordered by provider, will assess and evaluate patient's response and provide education to patient for prescribed medication. RN will report any adverse and/or side effects to prescribing provider.  Therapeutic Interventions: 1 on 1 counseling sessions, Psychoeducation, Medication administration, Evaluate responses to treatment, Monitor vital signs and CBGs as ordered, Perform/monitor CIWA, COWS, AIMS and Fall Risk screenings as ordered, Perform wound care treatments as ordered.  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  Short Term Goals: Engage patient in aftercare planning with referrals and resources, Facilitate patient progression through stages of change regarding substance use diagnoses and concerns and Identify triggers associated with mental health/substance abuse issues  Therapeutic Interventions: Assess for all discharge needs, 1 to 1 time with Social worker, Explore available resources and support systems, Assess for adequacy in community support network, Educate family and significant other(s) on suicide prevention, Complete Psychosocial Assessment, Interpersonal group therapy.  Evaluation of Outcomes: Progressing   Progress in Treatment: Attending groups: No.  Participating in groups: No. Taking medication as prescribed: Yes. Toleration medication: Yes. Family/Significant other contact made: No, will contact:  family member if patient consents. Patient  understands diagnosis: Yes. Discussing patient identified problems/goals with staff: Yes. Medical problems stabilized or resolved: Yes. Denies suicidal/homicidal ideation: No. Passive SI/Able to contract for safety on the unit.  Issues/concerns per patient self-inventory: No.  Other: n/a  New problem(s) identified: No, Describe:  n/a  New Short Term/Long Term Goal(s): medication stabilization; detox; development of comprehensive mental wellness/sobriety plan.   Discharge Plan or Barriers: CSW assessing for appropriate referrals. Pt plans to resume services at [facility].   Reason for Continuation of Hospitalization: Depression Medication stabilization Withdrawal symptoms  Suicidal Ideations   Estimated Length of Stay: 2-3 days   Attendees: Patient: 05/03/2016 3:45 PM  Physician: Dr. Sharolyn Douglas MD 05/03/2016 3:45 PM  Nursing: Mariea Clonts RN 05/03/2016 3:45 PM  RN Care Manager: Lars Pinks CM 05/03/2016 3:45 PM  Social Worker: Maxie Better, LCSW 05/03/2016 3:45 PM  Recreational Therapist:  05/03/2016 3:45 PM  Other: Catalina Pizza NP; Agustina Caroli NP 05/03/2016 3:45 PM  Other:  05/03/2016 3:45 PM  Other: 05/03/2016 3:45 PM    Scribe for Treatment Team: [place], LCSW 05/03/2016 3:45 PM

## 2016-05-03 NOTE — Progress Notes (Signed)
Psychoeducational Group Note  Date:  05/03/2016 Time:  2257  Group Topic/Focus:  Wrap-Up Group:   The focus of this group is to help patients review their daily goal of treatment and discuss progress on daily workbooks.   Participation Level: Did Not Attend  Participation Quality:  Not Applicable  Affect:  Not Applicable  Cognitive:  Not Applicable  Insight:  Not Applicable  Engagement in Group: Not Applicable  Additional Comments:  The patient did not attend the A.A. Meeting since he elected to remain in his room.    Archie Balboa S 05/03/2016, 10:57 PM

## 2016-05-03 NOTE — Progress Notes (Signed)
Recreation Therapy Notes  Date: 05/03/16 Time: 0930 Location: 300 [address] Dayroom  Group Topic: Coping Skills  Goal Area(s) Addresses:  Pt will be able to identify positive coping skills. Pt will be able to identify the importance of coping skills. Pt will be able to identify effects of coping skills post d/c?  Intervention: Magazines, scissors, glue sticks, construction paper, coping skills worksheet  Activity: Patients were given a worksheet divided into five areas: diversions, cognitive, social, tension releasers and physical.  Patients were to look through the magazines and find pictures that display coping skills that could be used for each area.  If the patient couldn't find a picture for the coping skill they wanted, they could write it in.  Education: Radiographer, therapeutic, Dentist.   Education Outcome: Acknowledges understanding/In group clarification offered/Needs additional education.   Clinical Observations/Feedback: Pt did not attend group.    Victorino Sparrow, LRT/CTRS         Victorino Sparrow A 05/03/2016 12:26 PM

## 2016-05-03 NOTE — Progress Notes (Signed)
[facility] MD Progress Note  05/03/2016 2:18 PM  Patient Active Problem List   Diagnosis Date Noted  . Affective psychosis, bipolar ([place]) 04/30/2016  . Cocaine abuse with cocaine-induced mood disorder ([place]) 03/04/2016  . Alcohol dependence with uncomplicated withdrawal ([place])   . Bipolar affective disorder, depressed, severe ([place]) 11/22/2015  . Alcohol use disorder, severe, dependence ([place]) 01/18/2015  . Alcohol dependence with alcohol-induced mood disorder ([place])   . Suicidal ideation   . Acute pancreatitis 03/20/2012  . Polysubstance abuse 03/20/2012  . Cocaine abuse 01/12/2012  . Cannabis abuse 01/12/2012  . Alcohol dependence ([place]) 10/15/2011  . Abnormal ECG   . Chest pain   . CAD in native artery   . GERD (gastroesophageal reflux disease)   . Tobacco abuse   . History of cocaine abuse   . History of ETOH abuse   . Bradycardia   . Syncope     Diagnosis: Alcohol use disorder, severe cocaine use disorder history of bipolar disorder  Subjective: Patient denies current suicidal or homicidal ideation, plan or intent and states that he is feeling somewhat better. He has been placed back on his Seroquel and has now got an order for Zoloft 25 mg by mouth daily and appears to be tolerating them well he reports that he hasn't been following up outpatient as he should but admits that he does need to go back to [place]. BAL on admission was 2-2 and patient has successfully been detoxified with an Ativan taper which ended this morning. If there remain stable and present course continues the plan is for him to leave Wednesday morning  Objective  Well-developed thin and somewhat weatherbeaten man in no apparent distress pleasant and appropriate speech and motor within normal limits thought mood is described as alright and affect is euthymic, thought processes linear and goal-directed if somewhat limited, thought content denies current psychosis or suicidal or homicidal ideation but plan or intent  alert, IQ appears an low average range insight and judgment are fair            Current Facility-Administered Medications (Other):  .  alum & mag hydroxide-simeth (MAALOX/MYLANTA) 200-200-20 MG/5ML suspension 30 mL .  loperamide (IMODIUM) capsule 2 mg .  magnesium hydroxide (MILK OF MAGNESIA) suspension 30 mL .  multivitamin with minerals tablet 1 tablet .  nicotine (NICODERM CQ - dosed in mg/24 hours) patch 21 mg .  QUEtiapine (SEROQUEL) tablet 100 mg .  sertraline (ZOLOFT) tablet 25 mg  No current outpatient prescriptions on file.  Vital Signs:Blood pressure 101/63, pulse 67, temperature 98.4 F (36.9 C), temperature source Oral, resp. rate 16, height 5\' 4"  (1.626 m), weight 49.9 kg (110 lb), SpO2 100 %.    Lab Results: No results found for this or any previous visit (from the past 48 hour(s)).  Physical Findings: AIMS:  , ,  ,  ,    CIWA:  CIWA-Ar Total: 1 COWS:  COWS Total Score: 0   Assessment/Plan: patient appears to have improved from discharge and we will monitor for about another day and if present course continues and he is stable on his medications the plan would be to release him Wednesday morning  Linard Millers, MD 05/03/2016, 2:18 PM

## 2016-05-03 NOTE — Plan of Care (Signed)
Problem: Activity: Goal: Interest or engagement in leisure activities will improve Outcome: Not Progressing Pt encouraged to attend groups to learn ways of managing depression. Pt non-compliant.

## 2016-05-03 NOTE — BHH Group Notes (Signed)
[place] LCSW Group Therapy  05/03/2016 3:47 PM  Type of Therapy:  Group Therapy  Participation Level:  Did Not Attend-invited. Chose to remain in bed.   Summary of Progress/Problems: Today's Topic: Overcoming Obstacles. Patients identified one short term goal and potential obstacles in reaching this goal. Patients processed barriers involved in overcoming these obstacles. Patients identified steps necessary for overcoming these obstacles and explored motivation (internal and external) for facing these difficulties head on.   [name] N Smart LCSW 05/03/2016, 3:47 PM

## 2016-05-03 NOTE — Progress Notes (Signed)
Patient ID: [name] [name], male   DOB: Mar 16, 1966, 50 y.o.   MRN: [id number]  Pt currently presents with a blunted affect and submissive behavior. Pt does not maintain eye contact during interaction, keeps eyes on the floor. Pt affect brightens during conversation. Pt reports to writer that their goal is to "get out of bed." Pt states "It wasn't a very good day today." Pt reports good sleep with current medication regimen.   Pt provided with medications per providers orders. Pt's labs and vitals were monitored throughout the night. Pt supported emotionally and encouraged to express concerns and questions. Pt educated on medications.  Pt's safety ensured with 15 minute and environmental checks. Pt currently denies SI/HI and A/V hallucinations. Pt verbally agrees to seek staff if SI/HI or A/VH occurs and to consult with staff before acting on any harmful thoughts. Will continue POC.

## 2016-05-03 NOTE — Progress Notes (Signed)
D: Pt verbalized feelings passive suicide ideation with no plan. Stayed in bed all day and has not attended groups. Appetite fair.  A: Pt encouraged to attend groups to learn coping skills on how to manage depression.   R: Pt contracted for safety.

## 2016-05-04 LAB — LIPID PANEL
CHOL/HDL RATIO: 2.6 ratio
CHOLESTEROL: 170 mg/dL (ref 0–200)
HDL: 65 mg/dL (ref >40)
LDL Cholesterol: 74 mg/dL (ref 0–99)
Triglycerides: 155 mg/dL — ABNORMAL HIGH (ref <150)
VLDL: 31 mg/dL (ref 0–40)

## 2016-05-04 NOTE — BHH Suicide Risk Assessment (Signed)
[facility] Discharge Suicide Risk Assessment   Principal Problem: Affective psychosis, bipolar [facility]) Discharge Diagnoses:  Patient Active Problem List   Diagnosis Date Noted  . Affective psychosis, bipolar ([place]) [F31.9] 04/30/2016  . Cocaine abuse with cocaine-induced mood disorder ([place]) [F14.14] 03/04/2016  . Alcohol dependence with uncomplicated withdrawal ([place]) [F10.230]   . Bipolar affective disorder, depressed, severe ([place]) [F31.4] 11/22/2015  . Alcohol use disorder, severe, dependence ([place]) [F10.20] 01/18/2015  . Alcohol dependence with alcohol-induced mood disorder ([place]) [F10.24]   . Suicidal ideation [R45.851]   . Acute pancreatitis [K85.90] 03/20/2012  . Polysubstance abuse [F19.10] 03/20/2012  . Cocaine abuse [F14.10] 01/12/2012  . Cannabis abuse [F12.10] 01/12/2012  . Alcohol dependence ([place]) [F10.20] 10/15/2011  . Abnormal ECG [R94.31]   . Chest pain [R07.89]   . CAD in native artery [I25.10]   . GERD (gastroesophageal reflux disease) [K21.9]   . Tobacco abuse [Z72.0]   . History of cocaine abuse [Z87.898]   . History of ETOH abuse [Z87.898]   . Bradycardia [R00.1]   . Syncope [R55]     Total Time spent with patient: 15 minutes  Musculoskeletal: Strength & Muscle Tone: within normal limits Gait & Station: normal Patient leans: N/A  Psychiatric Specialty Exam: ROS  Blood pressure 97/70, pulse 75, temperature 98 F (36.7 C), resp. rate 16, height 5\' 4"  (1.626 m), weight 49.9 kg (110 lb), SpO2 100 %.Body mass index is 18.88 kg/m.  General Appearance: Casual  Eye Contact::  Good  Speech:  Clear and Coherent  Volume:  Normal  Mood:  Euthymic  Affect:  Congruent  Thought Process:  Coherent  Orientation:  Negative  Thought Content:  Negative  Suicidal Thoughts:  No  Homicidal Thoughts:  No  Memory:  Negative  Judgement:  Fair  Insight:  Fair  Psychomotor Activity:  Normal  Concentration:  Fair  Recall:  [place] of Knowledge:Good  Language: Good   Akathisia:  No  Handed:  Right  AIMS (if indicated):   0  Assets:  Resilience  Sleep:  Number of Hours: 5.75  Cognition: WNL  ADL's:  Intact   Mental Status Per Nursing Assessment::   On Admission:  Suicidal ideation indicated by patient  Demographic Factors:  Male and Low socioeconomic status  Loss Factors: NA  Historical Factors: Family history of mental illness or substance abuse  Risk Reduction Factors:   Positive social support  Continued Clinical Symptoms:  Alcohol/Substance Abuse/Dependencies  Cognitive Features That Contribute To Risk:  None    Suicide Risk:  Mild:  Suicidal ideation of limited frequency, intensity, duration, and specificity.  There are no identifiable plans, no associated intent, mild dysphoria and related symptoms, good self-control (both objective and subjective assessment), few other risk factors, and identifiable protective factors, including available and accessible social support.  Follow-up Information    [facility] Follow up.   Specialty:  Behavioral Health Why:  Walk in between 8am-9am Monday through Friday for hospital follow-up/medication management/assessment for counseling services. [facility] will pick you up at discharge to transport you home. They can also work to schedule your next appt. Thank you.  Contact information: [name] [address] [phone]           Plan Of Care/Follow-up recommendations:  Other:  Patient denies current suicidal or homicidal ideation, plan or intent. He should abstain from using substances and keep his outpatient mental health appointments and remain on his current medications.  Linard Millers, MD 05/04/2016, 3:18 PM

## 2016-05-04 NOTE — Progress Notes (Signed)
Recreation Therapy Notes  Animal-Assisted Activity (AAA) Program Checklist/Progress Notes Patient Eligibility Criteria Checklist & Daily Group note for Rec TxIntervention  Date: 11.28.2017 Time: 2:45pm Location: [address]    AAA/T Program Assumption of Risk Form signed by Patient/ or Parent Legal Guardian Yes  Patient is free of allergies or sever asthma Yes  Patient reports no fear of animals Yes  Patient reports no history of cruelty to animals Yes  Patient understands his/her participation is voluntary Yes  Behavioral Response: Did not attend.   Laureen Ochs Elenore Wanninger, LRT/CTRS         Paeton Latouche L 05/04/2016 3:06 PM

## 2016-05-04 NOTE — Progress Notes (Signed)
D:  Patient's self inventory sheet, patient sleeps good, sleep medication is helpful.  Poor appetite, low energy level, good concentration.  Rated depression 6, hopeless 7, anxiety 10.  Denied withdrawals.  Denied SI.  Denied physical problems.  Has acid reflux at times.  Goal is to feel better. Plans to get out of bed.  Plans to discharge to parents' home. A:  Medications administered per MD orders.  Emotional support and encouragement given patient. R:  Patient denied SI and HI, contracts for safety.  Denied A/V hallucinations.  Safety maintained with 15 minute checks.

## 2016-05-04 NOTE — Progress Notes (Addendum)
Patient ID: [name] [name], male   DOB: 04/15/1966, 50 y.o.   MRN: [id number] D: Client up in dayroom, watching TV, reports of his day "better than the day before" "I'm anxious, I'll be following up with [facility]" "going home with my parents" A: Writer provided emotional support, medications reviewed, administered as ordered. Staff will monitor q73min for safety. R: Client is safe on the unit.

## 2016-05-04 NOTE — BHH Group Notes (Signed)
[place] LCSW Group Therapy  05/04/2016 1:23 PM  Type of Therapy:  Group Therapy  Participation Level:  Active  Participation Quality:  Attentive  Affect:  Appropriate  Cognitive:  Alert and Oriented  Insight:  Improving  Engagement in Therapy:  Improving  Modes of Intervention:  Confrontation, Discussion, Education, Problem-solving, Rapport Building, Socialization and Support  Summary of Progress/Problems: [facility] Speaker came to talk about his personal journey with substance abuse and addiction. The pt processed ways by which to relate to the speaker. [place] speaker provided handouts and educational information pertaining to groups and services offered by the [facility].   [name] [name] N Smart LCSW 05/04/2016, 1:23 PM

## 2016-05-04 NOTE — Plan of Care (Signed)
Problem: Activity: Goal: Imbalance in normal sleep/wake cycle will improve Outcome: Progressing  Nurse discussed depression/coping skills with patient.

## 2016-05-04 NOTE — Progress Notes (Signed)
Patient stayed in bed until 1200 noon today.  Patient encouraged to participate in morning groups.

## 2016-05-04 NOTE — BHH Group Notes (Signed)
The focus of this group is to educate the patient on the purpose and policies of crisis stabilization and provide a format to answer questions about their admission.  The group details unit policies and expectations of patients while admitted.  Patient did not attend 0900 nurse education orientation group this morning.  Patient stayed in bed.   

## 2016-05-04 NOTE — Progress Notes (Signed)
[facility] MD Progress Note  05/04/2016 10:22 AM  Patient Active Problem List   Diagnosis Date Noted  . Affective psychosis, bipolar ([place]) 04/30/2016  . Cocaine abuse with cocaine-induced mood disorder ([place]) 03/04/2016  . Alcohol dependence with uncomplicated withdrawal ([place])   . Bipolar affective disorder, depressed, severe ([place]) 11/22/2015  . Alcohol use disorder, severe, dependence ([place]) 01/18/2015  . Alcohol dependence with alcohol-induced mood disorder ([place])   . Suicidal ideation   . Acute pancreatitis 03/20/2012  . Polysubstance abuse 03/20/2012  . Cocaine abuse 01/12/2012  . Cannabis abuse 01/12/2012  . Alcohol dependence ([place]) 10/15/2011  . Abnormal ECG   . Chest pain   . CAD in native artery   . GERD (gastroesophageal reflux disease)   . Tobacco abuse   . History of cocaine abuse   . History of ETOH abuse   . Bradycardia   . Syncope     Diagnosis: Cocaine and alcohol use disorder, history of "bipolar" disorder  Subjective: Patient denies any suicidal or homicidal ideation plan or intent and states his mood is "better" and the medications seem to be working well. He still wishes to be discharged tomorrow if present course continues.  Objective: Well-developed thin man with a missing front tooth who is pleasant and appropriate mood is good and affect is euthymic thought processes are linear and goal-directed thought content no current psychosis or suicidal or homicidal ideation, plan or intent speech and motor appear within normal limits without any signs of withdrawal, alert and oriented, IQ appears an low average range insight and judgment are fair             Current Facility-Administered Medications (Other):  .  alum & mag hydroxide-simeth (MAALOX/MYLANTA) 200-200-20 MG/5ML suspension 30 mL .  loperamide (IMODIUM) capsule 2 mg .  magnesium hydroxide (MILK OF MAGNESIA) suspension 30 mL .  multivitamin with minerals tablet 1 tablet .  nicotine (NICODERM CQ - dosed  in mg/24 hours) patch 21 mg .  QUEtiapine (SEROQUEL) tablet 100 mg .  sertraline (ZOLOFT) tablet 25 mg  No current outpatient prescriptions on file.  Vital Signs:Blood pressure 97/70, pulse 75, temperature 98 F (36.7 C), resp. rate 16, height 5\' 4"  (1.626 m), weight 49.9 kg (110 lb), SpO2 100 %.    Lab Results:  Results for orders placed or performed during the hospital encounter of 04/30/16 (from the past 48 hour(s))  Lipid panel     Status: Abnormal   Collection Time: 05/04/16  6:20 AM  Result Value Ref Range   Cholesterol 170 0 - 200 mg/dL   Triglycerides 155 (H) <150 mg/dL   HDL 65 >40 mg/dL   Total CHOL/HDL Ratio 2.6 RATIO   VLDL 31 0 - 40 mg/dL   LDL Cholesterol 74 0 - 99 mg/dL    Comment:        Total Cholesterol/HDL:CHD Risk Coronary Heart Disease Risk Table                     Men   Women  1/2 Average Risk   3.4   3.3  Average Risk       5.0   4.4  2 X Average Risk   9.6   7.1  3 X Average Risk  23.4   11.0        Use the calculated Patient Ratio above and the CHD Risk Table to determine the patient's CHD Risk.        ATP III CLASSIFICATION (LDL):  <  100     mg/dL   Optimal  100-129  mg/dL   Near or Above                    Optimal  130-159  mg/dL   Borderline  160-189  mg/dL   High  >190     mg/dL   Very High Performed at [facility]     Physical Findings: AIMS:  , ,  ,  ,    CIWA:  CIWA-Ar Total: 0 COWS:  COWS Total Score: 0   Assessment/Plan: Patient has now resumed his medications and reports he is doing much better. He will be discharged tomorrow morning if present course continuous.  Linard Millers, MD 05/04/2016, 10:22 AM

## 2016-05-05 LAB — HEMOGLOBIN A1C
HEMOGLOBIN A1C: 4.9 % (ref 4.8–5.6)
MEAN PLASMA GLUCOSE: 94 mg/dL

## 2016-05-05 MED ORDER — SERTRALINE HCL 25 MG PO TABS: 25.0000 mg | ORAL_TABLET | Freq: Every day | ORAL | 0 refills | Status: DC

## 2016-05-05 MED ORDER — QUETIAPINE FUMARATE 100 MG PO TABS: 100.0000 mg | ORAL_TABLET | Freq: Every day | ORAL | 0 refills | Status: DC

## 2016-05-05 MED ORDER — NICOTINE 21 MG/24HR TD PT24: 21.0000 mg | MEDICATED_PATCH | Freq: Every day | TRANSDERMAL | 0 refills | Status: DC

## 2016-05-05 NOTE — Progress Notes (Signed)
  [facility] Adult Case Management Discharge Plan :  Will you be returning to the same living situation after discharge:  Yes,  home At discharge, do you have transportation home?: Yes,  [facility] will pick you up after lunch today. Do you have the ability to pay for your medications: Yes,  [employer] Medicaid  Release of information consent forms completed and submitted to medical records by CSW.  Patient to Follow up at: Follow-up Information    [facility] Follow up.   Specialty:  Behavioral Health Why:  Walk in between 8am-9am Monday through Friday for hospital follow-up/medication management/assessment for counseling services. [facility] will pick you up at discharge to transport you home. They can also work to schedule your next appt. Thank you.  Contact information: [place] [address] [phone]           Next level of care provider has access to [place] and Suicide Prevention discussed: Yes,  SPE completed with pt; he refused to consent to family contact. SPI pamphlet and Mobile Crisis information provided to pt.  Have you used any form of tobacco in the last 30 days? (Cigarettes, Smokeless Tobacco, Cigars, and/or Pipes): No  Has patient been referred to the Quitline?: N/A patient is not a smoker  Patient has been referred for addiction treatment: Yes  Kimber Relic Smart LCSW 05/05/2016, 8:47 AM

## 2016-05-05 NOTE — Progress Notes (Signed)
Recreation Therapy Notes  Date: 05/05/16 Time: 0930 Location: 300 [address] Dayroom  Group Topic: Stress Management  Goal Area(s) Addresses:  Patient will verbalize importance of using healthy stress management.  Patient will identify positive emotions associated with healthy stress management.   Behavioral Response: Engaged  Intervention: Stress Management  Activity :  Progressive Muscle Relaxation.  LRT introduced the stress management concept of progressive muscle relaxation.  LRT read a script to guide the patients through the activity.  Patients were to follow along as LRT read script to fully engage in group.  Education:  Stress Management, Discharge Planning.   Education Outcome: Acknowledges edcuation/In group clarification offered/Needs additional education  Clinical Observations/Feedback: Pt attended group.   Victorino Sparrow, LRT/CTRS         Victorino Sparrow A 05/05/2016 11:34 AM

## 2016-05-05 NOTE — Progress Notes (Signed)
Patient ID: [name] [name], male   DOB: 12/31/1965, 50 y.o.   MRN: [id number]  Pt. Denies SI/HI and A/V hallucinations. Belongings returned to patient at time of discharge. Patient denies any pain or discomfort. Discharge instructions and medications were reviewed with patient. Patient verbalized understanding of both medications and discharge instructions. Patient discharged to lobby where [facility] was waiting for transport. Q15 minute safety checks maintained until discharge. No distress upon discharge.

## 2016-05-05 NOTE — Tx Team (Signed)
Interdisciplinary Treatment and Diagnostic Plan Update  05/05/2016 Time of Session: 9:30AM [name] [name] MRN: [id number]  Principal Diagnosis: Affective psychosis, bipolar ([place])  Secondary Diagnoses: Principal Problem:   Affective psychosis, bipolar ([place])   Current Medications:  Current Facility-Administered Medications  Medication Dose Route Frequency Provider Last Rate Last Dose  . alum & mag hydroxide-simeth (MAALOX/MYLANTA) 200-200-20 MG/5ML suspension 30 mL  30 mL Oral Q4H PRN [name], NP   30 mL at 05/04/16 2126  . loperamide (IMODIUM) capsule 2 mg  2 mg Oral PRN Derrill Center, NP   2 mg at 05/01/16 1545  . magnesium hydroxide (MILK OF MAGNESIA) suspension 30 mL  30 mL Oral Daily PRN [name], NP      . multivitamin with minerals tablet 1 tablet  1 tablet Oral Daily [name], NP   1 tablet at 05/05/16 0752  . nicotine (NICODERM CQ - dosed in mg/24 hours) patch 21 mg  21 mg Transdermal Daily [name], NP   21 mg at 05/02/16 0843  . QUEtiapine (SEROQUEL) tablet 100 mg  100 mg Oral QHS [name], NP   100 mg at 05/04/16 2125  . sertraline (ZOLOFT) tablet 25 mg  25 mg Oral Daily [name], NP   25 mg at 05/05/16 5732   PTA Medications: Prescriptions Prior to Admission  Medication Sig Dispense Refill Last Dose  . nicotine (NICODERM CQ - DOSED IN MG/24 HOURS) 21 mg/24hr patch Place 1 patch (21 mg total) onto the skin daily. (Patient not taking: Reported on 04/30/2016) 28 patch 0 Unknown at Unknown time  . QUEtiapine (SEROQUEL) 100 MG tablet Take 1 tablet (100 mg total) by mouth at bedtime. (Patient not taking: Reported on 04/30/2016) 30 tablet 0 Unknown at Unknown time  . ranitidine (ZANTAC) 75 MG tablet Take 75 mg by mouth 2 (two) times daily.   Unknown at Unknown time  . sertraline (ZOLOFT) 25 MG tablet Take 1 tablet (25 mg total) by mouth daily. (Patient not taking: Reported on 04/30/2016) 30 tablet 0 Unknown at Unknown time    Patient  Stressors: Medication change or noncompliance Substance abuse  Patient Strengths: General fund of knowledge Motivation for treatment/growth  Treatment Modalities: Medication Management, Group therapy, Case management,  1 to 1 session with clinician, Psychoeducation, Recreational therapy.   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  Therapeutic Interventions: 1 to 1 sessions, Unit Group sessions and Medication administration.  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  Therapeutic Interventions: 1 to 1 sessions, Unit Group sessions and Medication administration.  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 RN will report any adverse and/or side effects to prescribing provider.  Therapeutic Interventions: 1 on 1 counseling sessions, Psychoeducation, Medication administration, Evaluate responses to treatment, Monitor vital signs and CBGs as ordered, Perform/monitor CIWA, COWS, AIMS and Fall Risk screenings as ordered, Perform wound care treatments as ordered.  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  Short Term Goals: Engage patient in aftercare planning with referrals and resources, Facilitate patient progression through stages of change regarding substance use diagnoses and concerns and Identify triggers associated with mental health/substance abuse issues  Therapeutic Interventions: Assess for all discharge needs, 1 to 1 time with Social worker, Explore available resources and support systems, Assess for adequacy in community support network, Educate family and significant other(s) on suicide prevention, Complete Psychosocial Assessment, Interpersonal group therapy.  Evaluation of Outcomes: Met   Progress in Treatment: Attending groups: Yes Participating in groups: Yes, minimally when he attends.  Taking medication as prescribed: Yes. Toleration medication: Yes. Family/Significant other contact made: SPE completed with pt; pt  declined to consent to family contact.  Patient understands diagnosis: Yes. Discussing patient identified problems/goals with staff: Yes. Medical problems stabilized or resolved: Yes. Denies suicidal/homicidal ideation: Yes, self report.  Issues/concerns per patient self-inventory: No.  Other: n/a  New problem(s) identified: No, Describe:  n/a  New Short Term/Long Term Goal(s): medication stabilization; detox; development of comprehensive mental wellness/sobriety plan.   Discharge Plan or Barriers:  Pt plans to resume services at [facility]. [facility] will pick him up and transport pt home today. Pt also provided with [employer] information for additional community support.   Reason for Continuation of Hospitalization: none  Estimated Length of Stay: d/c today   Attendees: Patient: 05/05/2016 8:48 AM  Physician: Dr. Sharolyn Douglas MD 05/05/2016 8:48 AM  Nursing: Trinna Post RN; Jan RN 05/05/2016 8:48 AM  RN Care Manager: Lars Pinks CM 05/05/2016 8:48 AM  Social Worker: Maxie Better, LCSW 05/05/2016 8:48 AM  Recreational Therapist:  05/05/2016 8:48 AM  Other:  05/05/2016 8:48 AM  Other:  05/05/2016 8:48 AM  Other: 05/05/2016 8:48 AM    Scribe for Treatment Team: [place], LCSW 05/05/2016 8:48 AM
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
## 2016-05-05 NOTE — Discharge Summary (Signed)
Physician Discharge Summary Note  Patient:  [name] [name] is an 50 y.o., male MRN:  [id number] DOB:  05/10/1966  Patient phone:  [phone] (home)   Patient address:   [place] [address],  Total Time spent with patient: Greater than 30 minutes  Date of Admission:  04/30/2016 Date of Discharge: 05/05/2016  Reason for Admission: Suicidal ideations with plans to overdose on medications.   Principal Problem: Affective psychosis, bipolar [facility])  Discharge Diagnoses: Patient Active Problem List   Diagnosis Date Noted  . Affective psychosis, bipolar ([place]) [F31.9] 04/30/2016  . Cocaine abuse with cocaine-induced mood disorder ([place]) [F14.14] 03/04/2016  . Alcohol dependence with uncomplicated withdrawal ([place]) [F10.230]   . Bipolar affective disorder, depressed, severe ([place]) [F31.4] 11/22/2015  . Alcohol use disorder, severe, dependence ([place]) [F10.20] 01/18/2015  . Alcohol dependence with alcohol-induced mood disorder ([place]) [F10.24]   . Suicidal ideation [R45.851]   . Acute pancreatitis [K85.90] 03/20/2012  . Polysubstance abuse [F19.10] 03/20/2012  . Cocaine abuse [F14.10] 01/12/2012  . Cannabis abuse [F12.10] 01/12/2012  . Alcohol dependence ([place]) [F10.20] 10/15/2011  . Abnormal ECG [R94.31]   . Chest pain [R07.89]   . CAD in native artery [I25.10]   . GERD (gastroesophageal reflux disease) [K21.9]   . Tobacco abuse [Z72.0]   . History of cocaine abuse [Z87.898]   . History of ETOH abuse [Z87.898]   . Bradycardia [R00.1]   . Syncope [R55]    Past Psychiatric History: See H&P  Past Medical History:  Past Medical History:  Diagnosis Date  . Abnormal ECG    a. early repolarization  . Arthritis    "my whole left side" (05/03/2014)  . Bipolar disorder ([place])   . Bradycardia    a. asymptomatic  . CAD in native artery    a. Nonobstructive cath 11/2007;  b. Presented with ST elevation - Nonobstructive cath 08/2011  . Chest pain, mid sternal   .  Coronary artery disease   . GERD (gastroesophageal reflux disease)   . History of cocaine abuse    a. quit ? 2009  . History of ETOH abuse    a. drinks 2 "40's" / wk  . Marijuana abuse    a. uses ~ 1x /wk or less  . Pneumonia   . Stomach ulcer   . Syncope    a. 12/2010 - presumed to be vasovagal  . Tobacco abuse     Past Surgical History:  Procedure Laterality Date  . CARDIAC CATHETERIZATION  2009; 08/2011   [name] 08/06/2011  . LEFT HEART CATHETERIZATION WITH CORONARY ANGIOGRAM N/A 08/16/2011   Procedure: LEFT HEART CATHETERIZATION WITH CORONARY ANGIOGRAM;  Surgeon: [name], MD;  Location: [facility] CATH LAB;  Service: Cardiovascular;  Laterality: N/A;   Family History: History reviewed. No pertinent family history.  Family Psychiatric  History: See H&P  Social History:  History  Alcohol Use  . 28.8 oz/week  . 48 Cans of beer per week    Comment: none for 2-3 weeks     History  Drug Use  . Types: Marijuana, Cocaine    Social History   Social History  . Marital status: Single    Spouse name: N/A  . Number of children: N/A  . Years of education: N/A   Social History Main Topics  . Smoking status: Current Some Day Smoker    Packs/day: 0.50    Years: 20.00    Types: Cigarettes    Last attempt to quit: 06/24/2015  .  Smokeless tobacco: Never Used  . Alcohol use 28.8 oz/week    48 Cans of beer per week     Comment: none for 2-3 weeks  . Drug use:     Types: Marijuana, Cocaine  . Sexual activity: Yes   Other Topics Concern  . None   Social History Narrative   Lives with his daughter currently and receives SSI.    Hospital Course: [name] [name] is a 50 yo male who presented to the ED with suicidal ideations and plan to overdose.  He minimizes his drinking and reports he does not have withdrawal symptoms when he is not drinking. Stressed at home where he lives with his parents but his nieces and nephews have been staying there and "I am easily agitated" because he has not  been on his medications.  He feels he needs to be started on medications and stabilized as he continues to endorse suicidal ideations.  No hallucinations or homicidal ideations.   [name] [name] was admitted to the hospital with his UDS reports showing positive THC. His BAL was 222 per toxicology tests results. He minimized his drinking problems & reported that when he was drinking, he did not have any alcohol withdrawal symptoms. He was here to restart his mental health medications to re-stabilize his mood. He was also presenting with suicidal ideations. And because of high alcohol content in his system & the tendency to have alcohol withdrawal symptoms when not drinking, [name] [name] was in need of alcohol detoxification treatments  He received Ativan detox regimen on a tapering dose format for his alcohol detoxification treatments.  He was also enrolled in the group counseling sessions, AA/NA meetings being offered and held on this unit. He participated and learned coping skills. He tolerated his treatment regimen without any significant adverse effects and or reactions.  Besides the detoxification treatments, [name] [name] was also medicated & discharged on; Sertraline 25 mg for depression, Seroquel 100 mg for mood control & Nicotine patch 21 mg for smoking cessation. [name] [name] has completed detox treatment and his mood is stable. This is evidenced by his reports of improved mood and absence substance withdrawal symptoms. He will resume psychiatric care and routine medication management at the [name], [name]. He was encouraged to join/attend AA/NA meetings being offered and held within his community to achieve & maintain maximum sobriety.   Upon discharge, [name] [name] adamantly denies any suicidal, homicidal ideations, auditory, visual hallucinations, delusional thoughts, paranoia & or substance withdrawal symptoms. He left [facility] with all personal belongings in no apparent distress.  Transportation per [employer].Marland Kitchen    Physical Findings: AIMS: Facial and Oral Movements Muscles of Facial Expression: None, normal Lips and Perioral Area: None, normal Jaw: None, normal Tongue: None, normal,Extremity Movements Upper (arms, wrists, hands, fingers): None, normal Lower (legs, knees, ankles, toes): None, normal, Trunk Movements Neck, shoulders, hips: None, normal, Overall Severity Severity of abnormal movements (highest score from questions above): None, normal Incapacitation due to abnormal movements: None, normal Patient's awareness of abnormal movements (rate only patient's report): No Awareness, Dental Status Current problems with teeth and/or dentures?: No Does patient usually wear dentures?: No  CIWA:  CIWA-Ar Total: 1 COWS:  COWS Total Score: 0  Musculoskeletal: Strength & Muscle Tone: within normal limits Gait & Station: normal Patient leans: N/A  Psychiatric Specialty Exam: Physical Exam  Nursing note and vitals reviewed. Constitutional: He is oriented to person, place, and time. He appears well-developed.  HENT:  Head: Normocephalic.  Eyes: Pupils are equal, round, and  reactive to light.  Neck: Normal range of motion.  Cardiovascular: Normal rate.   Respiratory: Effort normal.  GI: Soft.  Genitourinary:  Genitourinary Comments: Denies any issues in this area.  Musculoskeletal: Normal range of motion.  Neurological: He is alert and oriented to person, place, and time.  Skin: Skin is warm and dry.  Psychiatric: His affect is not labile. He is not agitated. Thought content is not paranoid and not delusional. Cognition and memory are not impaired. He does not exhibit a depressed mood. He expresses no homicidal and no suicidal ideation.    Review of Systems  Constitutional: Negative.  Negative for fever.  HENT: Negative.   Eyes: Negative.  Negative for blurred vision.  Respiratory: Negative.  Negative for cough.   Cardiovascular: Negative.  Negative for chest pain.  Gastrointestinal:  Negative.  Negative for heartburn.  Genitourinary: Negative.  Negative for dysuria.  Musculoskeletal: Negative.  Negative for myalgias.  Skin: Negative.  Negative for rash.  Neurological: Negative.  Negative for dizziness and headaches.  Endo/Heme/Allergies: Negative.   Psychiatric/Behavioral: Positive for depression (Stable) and substance abuse (Hx. alcoholism, chronic). Negative for hallucinations, memory loss and suicidal ideas. The patient has insomnia (Stable). The patient is not nervous/anxious.   All other systems reviewed and are negative.   Blood pressure (!) 103/58, pulse (!) 58, temperature 98 F (36.7 C), resp. rate 16, height 5\' 4"  (1.626 m), weight 49.9 kg (110 lb), SpO2 100 %.Body mass index is 18.88 kg/m.  See Md's SRA.  Have you used any form of tobacco in the last 30 days? (Cigarettes, Smokeless Tobacco, Cigars, and/or Pipes): No  Has this patient used any form of tobacco in the last 30 days? (Cigarettes, Smokeless Tobacco, Cigars, and/or Pipes): Yes, Nicotine patch prescription given provided.  Blood Alcohol level:  Lab Results  Component Value Date   ETH 222 (H) 04/29/2016   ETH 203 (H) 0000000   Metabolic Disorder Labs:  Lab Results  Component Value Date   HGBA1C 4.9 05/04/2016   MPG 94 05/04/2016   MPG 82 05/03/2014   No results found for: PROLACTIN Lab Results  Component Value Date   CHOL 170 05/04/2016   TRIG 155 (H) 05/04/2016   HDL 65 05/04/2016   CHOLHDL 2.6 05/04/2016   VLDL 31 05/04/2016   LDLCALC 74 05/04/2016   LDLCALC 42 05/03/2014   See Psychiatric Specialty Exam and Suicide Risk Assessment completed by Attending Physician prior to discharge.  Discharge destination:  Home  Is patient on multiple antipsychotic therapies at discharge:  No   Has Patient had three or more failed trials of antipsychotic monotherapy by history:  No  Recommended Plan for Multiple Antipsychotic Therapies: NA    Medication List    STOP taking these  medications   ranitidine 75 MG tablet Commonly known as:  ZANTAC     TAKE these medications     Indication  nicotine 21 mg/24hr patch Commonly known as:  NICODERM CQ - dosed in mg/24 hours Place 1 patch (21 mg total) onto the skin daily. For smoking cessation Start taking on:  05/06/2016 What changed:  additional instructions  Indication:  Nicotine Addiction   QUEtiapine 100 MG tablet Commonly known as:  SEROQUEL Take 1 tablet (100 mg total) by mouth at bedtime. For mood control What changed:  additional instructions  Indication:  Mood control   sertraline 25 MG tablet Commonly known as:  ZOLOFT Take 1 tablet (25 mg total) by mouth daily. For depression Start taking on:  05/06/2016 What changed:  additional instructions  Indication:  Major Depressive Disorder      Follow-up Information    [facility] Follow up.   Specialty:  Behavioral Health Why:  Walk in between 8am-9am Monday through Friday for hospital follow-up/medication management/assessment for counseling services. [facility] will pick you up at discharge to transport you home. They can also work to schedule your next appt. Thank you.  Contact information: [place] [address] [phone]          Follow-up recommendations: Activity:  As tolerated Diet: As recommended by your primary care doctor. Keep all scheduled follow-up appointments as recommended.   Comments: Patient is instructed prior to discharge to: Take all medications as prescribed by his/her mental healthcare provider. Report any adverse effects and or reactions from the medicines to his/her outpatient provider promptly. Patient has been instructed & cautioned: To not engage in alcohol and or illegal drug use while on prescription medicines. In the event of worsening symptoms, patient is instructed to call the crisis hotline, 911 and or go to the nearest ED for appropriate evaluation and treatment of symptoms. To follow-up with  his/her primary care provider for your other medical issues, concerns and or health care needs.   Signed: Encarnacion Slates, NP PMHNP-BC 05/05/2016, 10:13 AM

## 2016-05-10 ENCOUNTER — Encounter (HOSPITAL_COMMUNITY): Payer: Self-pay | Admitting: *Deleted

## 2016-05-10 ENCOUNTER — Emergency Department (HOSPITAL_COMMUNITY): Payer: Medicaid Other

## 2016-05-10 ENCOUNTER — Emergency Department (HOSPITAL_COMMUNITY)
Admission: EM | Admit: 2016-05-10 | Discharge: 2016-05-10 | Disposition: A | Discharge status: Home / Self Care | Payer: Medicaid Other | Attending: Emergency Medicine | Admitting: Emergency Medicine

## 2016-05-10 DIAGNOSIS — R079 Chest pain, unspecified: Secondary | ICD-10-CM | POA: Diagnosis not present

## 2016-05-10 DIAGNOSIS — I2510 Atherosclerotic heart disease of native coronary artery without angina pectoris: Secondary | ICD-10-CM | POA: Diagnosis not present

## 2016-05-10 DIAGNOSIS — Z5321 Procedure and treatment not carried out due to patient leaving prior to being seen by health care provider: Secondary | ICD-10-CM | POA: Insufficient documentation

## 2016-05-10 DIAGNOSIS — R55 Syncope and collapse: Secondary | ICD-10-CM | POA: Diagnosis present

## 2016-05-10 LAB — BASIC METABOLIC PANEL
ANION GAP: 6 (ref 5–15)
BUN: 6 mg/dL (ref 6–20)
CHLORIDE: 106 mmol/L (ref 101–111)
CO2: 27 mmol/L (ref 22–32)
Calcium: 9 mg/dL (ref 8.9–10.3)
Creatinine, Ser: 1.08 mg/dL (ref 0.61–1.24)
GFR calc Af Amer: >60 mL/min (ref >60)
Glucose, Bld: 87 mg/dL (ref 65–99)
POTASSIUM: 4 mmol/L (ref 3.5–5.1)
SODIUM: 139 mmol/L (ref 135–145)

## 2016-05-10 LAB — CBC
HEMATOCRIT: 36.9 % — AB (ref 39.0–52.0)
HEMOGLOBIN: 12.9 g/dL — AB (ref 13.0–17.0)
MCH: 30.8 pg (ref 26.0–34.0)
MCHC: 35 g/dL (ref 30.0–36.0)
MCV: 88.1 fL (ref 78.0–100.0)
Platelets: 229 10*3/uL (ref 150–400)
RBC: 4.19 MIL/uL — ABNORMAL LOW (ref 4.22–5.81)
RDW: 13.3 % (ref 11.5–15.5)
WBC: 4.4 10*3/uL (ref 4.0–10.5)

## 2016-05-10 LAB — I-STAT TROPONIN, ED: Troponin i, poc: 0 ng/mL (ref 0.00–0.08)

## 2016-05-10 NOTE — ED Notes (Signed)
Called for pt. To go to the [facility],. Unable to locate the pt. In the waiting room, or outside

## 2016-05-10 NOTE — ED Notes (Signed)
Patient called x3, no response

## 2016-05-10 NOTE — ED Triage Notes (Addendum)
Pt states yesterday he was standing and felt like he was going to faint.  He sat down and the feeling passed after 1 min.  Later that evening he began experiencing R sided chest pain that increases with inspiration.  Was tx for chest pain last week that was dx as gerd, but this does not feel the same.  No etoh since yesterday and denies recent cocaine use.

## 2016-05-17 ENCOUNTER — Encounter (HOSPITAL_COMMUNITY): Payer: Self-pay

## 2016-05-17 DIAGNOSIS — Z79899 Other long term (current) drug therapy: Secondary | ICD-10-CM | POA: Insufficient documentation

## 2016-05-17 DIAGNOSIS — I2510 Atherosclerotic heart disease of native coronary artery without angina pectoris: Secondary | ICD-10-CM | POA: Insufficient documentation

## 2016-05-17 DIAGNOSIS — F1210 Cannabis abuse, uncomplicated: Secondary | ICD-10-CM | POA: Insufficient documentation

## 2016-05-17 DIAGNOSIS — F17210 Nicotine dependence, cigarettes, uncomplicated: Secondary | ICD-10-CM | POA: Insufficient documentation

## 2016-05-17 LAB — RAPID URINE DRUG SCREEN, HOSP PERFORMED
Amphetamines: NOT DETECTED
BENZODIAZEPINES: NOT DETECTED
Barbiturates: NOT DETECTED
COCAINE: NOT DETECTED
Opiates: NOT DETECTED
Tetrahydrocannabinol: POSITIVE — AB

## 2016-05-17 LAB — ETHANOL: ALCOHOL ETHYL (B): 146 mg/dL — AB (ref <5)

## 2016-05-17 LAB — CBC
HCT: 38.7 % — ABNORMAL LOW (ref 39.0–52.0)
Hemoglobin: 13.9 g/dL (ref 13.0–17.0)
MCH: 31.9 pg (ref 26.0–34.0)
MCHC: 35.9 g/dL (ref 30.0–36.0)
MCV: 88.8 fL (ref 78.0–100.0)
PLATELETS: 247 10*3/uL (ref 150–400)
RBC: 4.36 MIL/uL (ref 4.22–5.81)
RDW: 13.5 % (ref 11.5–15.5)
WBC: 7.6 10*3/uL (ref 4.0–10.5)

## 2016-05-17 LAB — COMPREHENSIVE METABOLIC PANEL
ALK PHOS: 63 U/L (ref 38–126)
ALT: 30 U/L (ref 17–63)
AST: 32 U/L (ref 15–41)
Albumin: 4.2 g/dL (ref 3.5–5.0)
Anion gap: 10 (ref 5–15)
BILIRUBIN TOTAL: 0.4 mg/dL (ref 0.3–1.2)
CALCIUM: 9.1 mg/dL (ref 8.9–10.3)
CHLORIDE: 103 mmol/L (ref 101–111)
CO2: 23 mmol/L (ref 22–32)
CREATININE: 1.1 mg/dL (ref 0.61–1.24)
Glucose, Bld: 86 mg/dL (ref 65–99)
Potassium: 3.7 mmol/L (ref 3.5–5.1)
Sodium: 136 mmol/L (ref 135–145)
TOTAL PROTEIN: 7.2 g/dL (ref 6.5–8.1)

## 2016-05-17 NOTE — ED Notes (Signed)
Pt up to nurse first asking for update regarding triage times, making threatening SI statements to this RN - "If I disappear..." and making a hand gun gesture to his head.

## 2016-05-17 NOTE — ED Triage Notes (Signed)
Pt endorses suicidal ideation stating "it was my birthday today and I am depressed"  and that he has been off of his bipolar meds for over a year. Pt has no plan for harming self. Pt admits to drinking alcohol, smoked marijuana earlier today. Denies other drug use.

## 2016-05-18 ENCOUNTER — Encounter (HOSPITAL_COMMUNITY): Payer: Self-pay

## 2016-05-18 ENCOUNTER — Emergency Department (HOSPITAL_COMMUNITY)
Admission: EM | Admit: 2016-05-18 | Discharge: 2016-05-18 | Disposition: A | Discharge status: Other Acute Inpatient Hospital | Payer: Medicaid Other | Attending: Emergency Medicine | Admitting: Emergency Medicine

## 2016-05-18 ENCOUNTER — Inpatient Hospital Stay (HOSPITAL_COMMUNITY)
Admission: AD | Admit: 2016-05-18 | Discharge: 2016-05-23 | DRG: 881 | Disposition: A | Discharge status: Home / Self Care | Payer: Medicaid Other | Source: Intra-hospital | Attending: Psychiatry | Admitting: Psychiatry

## 2016-05-18 DIAGNOSIS — F4310 Post-traumatic stress disorder, unspecified: Secondary | ICD-10-CM | POA: Diagnosis present

## 2016-05-18 DIAGNOSIS — F17200 Nicotine dependence, unspecified, uncomplicated: Secondary | ICD-10-CM | POA: Diagnosis present

## 2016-05-18 DIAGNOSIS — Z888 Allergy status to other drugs, medicaments and biological substances status: Secondary | ICD-10-CM | POA: Diagnosis not present

## 2016-05-18 DIAGNOSIS — F1099 Alcohol use, unspecified with unspecified alcohol-induced disorder: Secondary | ICD-10-CM | POA: Diagnosis not present

## 2016-05-18 DIAGNOSIS — Z91018 Allergy to other foods: Secondary | ICD-10-CM

## 2016-05-18 DIAGNOSIS — F332 Major depressive disorder, recurrent severe without psychotic features: Secondary | ICD-10-CM | POA: Diagnosis not present

## 2016-05-18 DIAGNOSIS — F1910 Other psychoactive substance abuse, uncomplicated: Secondary | ICD-10-CM

## 2016-05-18 DIAGNOSIS — F1020 Alcohol dependence, uncomplicated: Secondary | ICD-10-CM | POA: Clinically undetermined

## 2016-05-18 DIAGNOSIS — F17210 Nicotine dependence, cigarettes, uncomplicated: Secondary | ICD-10-CM | POA: Diagnosis not present

## 2016-05-18 DIAGNOSIS — Z886 Allergy status to analgesic agent status: Secondary | ICD-10-CM

## 2016-05-18 DIAGNOSIS — R45851 Suicidal ideations: Secondary | ICD-10-CM

## 2016-05-18 DIAGNOSIS — F4321 Adjustment disorder with depressed mood: Secondary | ICD-10-CM | POA: Diagnosis not present

## 2016-05-18 DIAGNOSIS — Y906 Blood alcohol level of 120-199 mg/100 ml: Secondary | ICD-10-CM | POA: Diagnosis present

## 2016-05-18 DIAGNOSIS — F1210 Cannabis abuse, uncomplicated: Secondary | ICD-10-CM | POA: Diagnosis present

## 2016-05-18 DIAGNOSIS — F1421 Cocaine dependence, in remission: Secondary | ICD-10-CM | POA: Diagnosis present

## 2016-05-18 DIAGNOSIS — I2510 Atherosclerotic heart disease of native coronary artery without angina pectoris: Secondary | ICD-10-CM | POA: Diagnosis present

## 2016-05-18 DIAGNOSIS — F329 Major depressive disorder, single episode, unspecified: Principal | ICD-10-CM | POA: Diagnosis present

## 2016-05-18 DIAGNOSIS — Z915 Personal history of self-harm: Secondary | ICD-10-CM | POA: Diagnosis not present

## 2016-05-18 DIAGNOSIS — Z9889 Other specified postprocedural states: Secondary | ICD-10-CM | POA: Diagnosis not present

## 2016-05-18 DIAGNOSIS — Z79899 Other long term (current) drug therapy: Secondary | ICD-10-CM

## 2016-05-18 DIAGNOSIS — Z9114 Patient's other noncompliance with medication regimen: Secondary | ICD-10-CM

## 2016-05-18 DIAGNOSIS — K219 Gastro-esophageal reflux disease without esophagitis: Secondary | ICD-10-CM | POA: Diagnosis present

## 2016-05-18 MED ORDER — ALUM & MAG HYDROXIDE-SIMETH 200-200-20 MG/5ML PO SUSP: 30.0000 mL | ORAL | Status: DC | PRN

## 2016-05-18 MED ORDER — TRAZODONE HCL 50 MG PO TABS
50.0000 mg | ORAL_TABLET | Freq: Every evening | ORAL | Status: DC | PRN
  Administered 2016-05-18 – 2016-05-19 (×2): 50 mg via ORAL
  Filled 2016-05-18 (×9): qty 1

## 2016-05-18 MED ORDER — MAGNESIUM HYDROXIDE 400 MG/5ML PO SUSP: 30.0000 mL | Freq: Every day | ORAL | Status: DC | PRN

## 2016-05-18 MED ORDER — NICOTINE 21 MG/24HR TD PT24
21.0000 mg | MEDICATED_PATCH | Freq: Every day | TRANSDERMAL | Status: DC
  Filled 2016-05-18 (×7): qty 1

## 2016-05-18 MED ORDER — ACETAMINOPHEN 325 MG PO TABS: 650.0000 mg | ORAL_TABLET | Freq: Four times a day (QID) | ORAL | Status: DC | PRN

## 2016-05-18 MED ORDER — METHOCARBAMOL 500 MG PO TABS
500.0000 mg | ORAL_TABLET | Freq: Three times a day (TID) | ORAL | Status: DC | PRN
  Administered 2016-05-18: 500 mg via ORAL
  Filled 2016-05-18: qty 1

## 2016-05-18 MED ORDER — LORAZEPAM 0.5 MG PO TABS: 0.5000 mg | ORAL_TABLET | Freq: Four times a day (QID) | ORAL | Status: DC | PRN

## 2016-05-18 NOTE — Tx Team (Signed)
Initial Treatment Plan 05/18/2016 3:43 PM [name] [name] [id number]    PATIENT STRESSORS: Financial difficulties Health problems Substance abuse   PATIENT STRENGTHS: Capable of independent living Communication skills General fund of knowledge Motivation for treatment/growth Supportive family/friends   PATIENT IDENTIFIED PROBLEMS: Depression  Suicidal ideation  Substance abuse  "Feel better"  "Not be depressed all the time"             DISCHARGE CRITERIA:  Improved stabilization in mood, thinking, and/or behavior Verbal commitment to aftercare and medication compliance Withdrawal symptoms are absent or subacute and managed without 24-hour nursing intervention  PRELIMINARY DISCHARGE PLAN: Outpatient therapy medication management  PATIENT/FAMILY INVOLVEMENT: This treatment plan has been presented to and reviewed with the patient, [name] [name].  The patient and family have been given the opportunity to ask questions and make suggestions.  Windell Moment, RN 05/18/2016, 3:43 PM

## 2016-05-18 NOTE — ED Notes (Signed)
Pt resting in bed, denies any complaints at this time. Will continue to monitor

## 2016-05-18 NOTE — ED Provider Notes (Signed)
[facility] DEPT Provider Note   CSN: [id number] Arrival date & time: 05/17/16  2026     History   Chief Complaint Chief Complaint  Patient presents with  . Suicidal    HPI [name] [name] is a 50 y.o. male.  Patient presents with suicidal thoughts that started over the last 1 day. No self harm prior to arrival. He reports homicidal thoughts as well that are not target directed. No AVH. No physical complaints.   The history is provided by the patient. No language interpreter was used.    Past Medical History:  Diagnosis Date  . Abnormal ECG    a. early repolarization  . Arthritis    "my whole left side" (05/03/2014)  . Bipolar disorder ([place])   . Bradycardia    a. asymptomatic  . CAD in native artery    a. Nonobstructive cath 11/2007;  b. Presented with ST elevation - Nonobstructive cath 08/2011  . Chest pain, mid sternal   . Coronary artery disease   . GERD (gastroesophageal reflux disease)   . History of cocaine abuse    a. quit ? 2009  . History of ETOH abuse    a. drinks 2 "40's" / wk  . Marijuana abuse    a. uses ~ 1x /wk or less  . Pneumonia   . Stomach ulcer   . Syncope    a. 12/2010 - presumed to be vasovagal  . Tobacco abuse     Patient Active Problem List   Diagnosis Date Noted  . Affective psychosis, bipolar ([place]) 04/30/2016  . Cocaine abuse with cocaine-induced mood disorder ([place]) 03/04/2016  . Alcohol dependence with uncomplicated withdrawal ([place])   . Bipolar affective disorder, depressed, severe ([place]) 11/22/2015  . Alcohol use disorder, severe, dependence ([place]) 01/18/2015  . Alcohol dependence with alcohol-induced mood disorder ([place])   . Suicidal ideation   . Acute pancreatitis 03/20/2012  . Polysubstance abuse 03/20/2012  . Cocaine abuse 01/12/2012  . Cannabis abuse 01/12/2012  . Alcohol dependence ([place]) 10/15/2011  . Abnormal ECG   . Chest pain   . CAD in native artery   . GERD (gastroesophageal reflux disease)   . Tobacco abuse   .  History of cocaine abuse   . History of ETOH abuse   . Bradycardia   . Syncope     Past Surgical History:  Procedure Laterality Date  . CARDIAC CATHETERIZATION  2009; 08/2011   [name] 08/06/2011  . LEFT HEART CATHETERIZATION WITH CORONARY ANGIOGRAM N/A 08/16/2011   Procedure: LEFT HEART CATHETERIZATION WITH CORONARY ANGIOGRAM;  Surgeon: [name], MD;  Location: [facility] CATH LAB;  Service: Cardiovascular;  Laterality: N/A;       Home Medications    Prior to Admission medications   Medication Sig Start Date End Date Taking? Authorizing Provider  nicotine (NICODERM CQ - DOSED IN MG/24 HOURS) 21 mg/24hr patch Place 1 patch (21 mg total) onto the skin daily. For smoking cessation 05/06/16   Encarnacion Slates, NP  QUEtiapine (SEROQUEL) 100 MG tablet Take 1 tablet (100 mg total) by mouth at bedtime. For mood control 05/05/16   Encarnacion Slates, NP  sertraline (ZOLOFT) 25 MG tablet Take 1 tablet (25 mg total) by mouth daily. For depression 05/06/16   Encarnacion Slates, NP    Family History History reviewed. No pertinent family history.  Social History Social History  Substance Use Topics  . Smoking status: Current Some Day Smoker    Packs/day: 0.50  Years: 20.00    Types: Cigarettes    Last attempt to quit: 06/24/2015  . Smokeless tobacco: Never Used  . Alcohol use 28.8 oz/week    48 Cans of beer per week     Comment: 2 40 oz beers per day     Allergies   Aspirin; Pepperoni [pickled meat]; Tomato; and Tylenol [acetaminophen]   Review of Systems Review of Systems  Constitutional: Negative for chills and fever.  HENT: Negative.   Respiratory: Negative.   Cardiovascular: Negative.   Gastrointestinal: Negative.   Musculoskeletal: Negative.   Skin: Negative.   Neurological: Negative.   Psychiatric/Behavioral: Positive for dysphoric mood and suicidal ideas. Negative for self-injury.     Physical Exam Updated Vital Signs BP 102/64 (BP Location: Left Arm)   Pulse 76   Temp  97.6 F (36.4 C) (Oral)   Resp 16   Ht 5\' 4"  (1.626 m)   Wt 52.2 kg   SpO2 97%   BMI 19.74 kg/m   Physical Exam  Constitutional: He is oriented to person, place, and time. He appears well-developed and well-nourished.  HENT:  Head: Normocephalic.  Neck: Normal range of motion. Neck supple.  Cardiovascular: Normal rate and regular rhythm.   Pulmonary/Chest: Effort normal and breath sounds normal.  Abdominal: Soft. Bowel sounds are normal. There is no tenderness. There is no rebound and no guarding.  Musculoskeletal: Normal range of motion.  Neurological: He is alert and oriented to person, place, and time.  Skin: Skin is warm and dry. No rash noted.  Psychiatric: His speech is normal. He is slowed. He is not actively hallucinating. He exhibits a depressed mood. He expresses suicidal ideation.     ED Treatments / Results  Labs (all labs ordered are listed, but only abnormal results are displayed) Labs Reviewed  COMPREHENSIVE METABOLIC PANEL - Abnormal; Notable for the following:       Result Value   BUN <5 (*)    All other components within normal limits  ETHANOL - Abnormal; Notable for the following:    Alcohol, Ethyl (B) 146 (*)    All other components within normal limits  CBC - Abnormal; Notable for the following:    HCT 38.7 (*)    All other components within normal limits  RAPID URINE DRUG SCREEN, HOSP PERFORMED - Abnormal; Notable for the following:    Tetrahydrocannabinol POSITIVE (*)    All other components within normal limits    EKG  EKG Interpretation None       Radiology No results found.  Procedures Procedures (including critical care time)  Medications Ordered in ED Medications - No data to display   Initial Impression / Assessment and Plan / ED Course  I have reviewed the triage vital signs and the nursing notes.  Pertinent labs & imaging results that were available during my care of the patient were reviewed by me and considered in my  medical decision making (see chart for details).  Clinical Course     Patient with SI/HI. Will require TTS evaluation to determine disposition.  Final Clinical Impressions(s) / ED Diagnoses   Final diagnoses:  None  1. SI/HI  New Prescriptions New Prescriptions   No medications on file     Charlann Lange, Hershal Coria 05/18/16 0116    Tanna Furry, MD 05/23/16 1513

## 2016-05-18 NOTE — Progress Notes (Signed)
Patient ID: [name] [name], male   DOB: [birth year], 50 y.o.   MRN: [id number] D: Client visible on the unit, in dayroom watching TV. Client reports depression "5" and anxiety "8" of 10. Client reports of day "tired, stressful" "taking it minute by minute, trying to get back on my medicine" A: Writer provided emotional support, staff with on-call provider, received order for Trazodone for sleep(see [facility]) Client encouraged to speak with physician tomorrow for follow-up. Staff will monitor q66min for safety. R: Client is safe on the unit, attended group.

## 2016-05-18 NOTE — ED Notes (Signed)
Patient was given a snack and a drink, and a regular diet was ordered for Lunch.

## 2016-05-18 NOTE — ED Provider Notes (Signed)
2:25 PM Assumed care from Charlann Lange, please see their note for full history, physical and decision making until this point. In brief this is a 50 y.o. year old male who presented to the ED tonight with Suicidal     Was suicidal, is still havign thoughts. Accepted by Dr. Parke Poisson at [facility], will xfer for further care. Patient understanding, no needs verbalized to me.   Labs, studies and imaging reviewed by myself and considered in medical decision making if ordered. Imaging interpreted by radiology.  Labs Reviewed  COMPREHENSIVE METABOLIC PANEL - Abnormal; Notable for the following:       Result Value   BUN <5 (*)    All other components within normal limits  ETHANOL - Abnormal; Notable for the following:    Alcohol, Ethyl (B) 146 (*)    All other components within normal limits  CBC - Abnormal; Notable for the following:    HCT 38.7 (*)    All other components within normal limits  RAPID URINE DRUG SCREEN, HOSP PERFORMED - Abnormal; Notable for the following:    Tetrahydrocannabinol POSITIVE (*)    All other components within normal limits    No orders to display    No Follow-up on file.    Merrily Pew, MD 05/18/16 [phone]

## 2016-05-18 NOTE — ED Notes (Signed)
Report called to caroline at [facility] instructed me that the patient could come at 1430

## 2016-05-18 NOTE — ED Notes (Signed)
Pt. sleeping with no distress/respirations unlabored .

## 2016-05-18 NOTE — ED Notes (Signed)
Notified [facility] for transportation to [facility] 

## 2016-05-18 NOTE — Discharge Instructions (Signed)
TRANSFER TO [facility], DR. COBOS ACCEPTING.

## 2016-05-18 NOTE — Progress Notes (Signed)
Pt accepted to [facility] bed 404-1, attending Dr. Parke Poisson. Admission voluntary. Pt can arrive anytime per [facility], call report to [phone].  Sharren Bridge, MSW, LCSW Clinical Social Work, Disposition  05/18/2016 [phone]

## 2016-05-18 NOTE — ED Notes (Signed)
ED Provider at bedside. 

## 2016-05-18 NOTE — ED Notes (Signed)
[facility] called and stated they had placement, he will be going to 404-1 he can come any time, sign voluntary admission form. Ex 970. Can call report to [phone]. Dr. Parke Poisson will be the accepting

## 2016-05-18 NOTE — BH Assessment (Addendum)
Tele Assessment Note   [name] [name] is an 50 y.o. male. Pt presents with c/o "depression, stress, marijuana, and I haven't been on my medications in a year". Pt reports suicidal ideation with intent and without plan. Pt reports feeling hopeless and states, "I just want to give up. I don't want to be here" Pt has history of alcohol use, cocaine use, and bipolar disorder. Pt has h/o three [facility] inpatient admission this year- most recent discharge 11.29.17 after receiving treatment for suicidal ideations with plan to overdose on medication.  Pt states he did not follow up with discharge instructions and appointments. Pt states, "I'm not stable enough to deal with meds in the environment that I'm staying in". Pt reports he lives with his parents, with several other adults and sleeps in the living room.   Pt denies h/o self-harm. Pt denies homicidal ideation and thoughts of harm towards others. Pt denies hallucinations. Pt does not appear to be responding to internal stimuli or experiencing delusional thought content at time of this interview.   Pt reports experiencing "small blackouts". Pt states he will see colors and then will pass out. Pt states this last occurred one week ago. Pt states he gets dizzy if he stands too rapidly. Pt reports these episodes occur both in and outside of the context of alcohol intoxication.  Pt states that he has an ACTT team however, is unable to recall provider name and contact information.    Diagnosis: Bipolar d/o, depressive Cocaine use Alcohol use Cannabis use  Past Medical History:  Past Medical History:  Diagnosis Date  . Abnormal ECG    a. early repolarization  . Arthritis    "my whole left side" (05/03/2014)  . Bipolar disorder ([place])   . Bradycardia    a. asymptomatic  . CAD in native artery    a. Nonobstructive cath 11/2007;  b. Presented with ST elevation - Nonobstructive cath 08/2011  . Chest pain, mid sternal   . Coronary artery disease   . GERD  (gastroesophageal reflux disease)   . History of cocaine abuse    a. quit ? 2009  . History of ETOH abuse    a. drinks 2 "40's" / wk  . Marijuana abuse    a. uses ~ 1x /wk or less  . Pneumonia   . Stomach ulcer   . Syncope    a. 12/2010 - presumed to be vasovagal  . Tobacco abuse     Past Surgical History:  Procedure Laterality Date  . CARDIAC CATHETERIZATION  2009; 08/2011   [name] 08/06/2011  . LEFT HEART CATHETERIZATION WITH CORONARY ANGIOGRAM N/A 08/16/2011   Procedure: LEFT HEART CATHETERIZATION WITH CORONARY ANGIOGRAM;  Surgeon: [name], MD;  Location: [facility] CATH LAB;  Service: Cardiovascular;  Laterality: N/A;    Family History: History reviewed. No pertinent family history.  Social History:  reports that he has been smoking Cigarettes.  He has a 10.00 pack-year smoking history. He has never used smokeless tobacco. He reports that he drinks about 28.8 oz of alcohol per week . He reports that he uses drugs, including Marijuana and Cocaine.  Additional Social History:  Alcohol / Drug Use Pain Medications: Pt denies abuse. Prescriptions: Pt denies abuse. Over the Counter: Pt denies abuse. History of alcohol / drug use?: Yes Longest period of sobriety (when/how long): Not Reported Withdrawal Symptoms:  (Pt denies withdrawal sxs) Substance #1 Name of Substance 1: Alcohol 1 - Age of First Use: Not Reported 1 -  Frequency: when able to obtain it, typically weekly 1 - Last Use / Amount: 12.11.17 Substance #2 Name of Substance 2: Marijuana 2 - Frequency: Every other day 2 - Last Use / Amount: 12.11.17/ S10 worth Substance #3 Name of Substance 3: Crack Cocaine 3 - Frequency: "anyime I could get it, basically once or twice a month" 3 - Last Use / Amount: "over one month ago"  CIWA: CIWA-Ar BP: 102/64 Pulse Rate: 76 COWS:    PATIENT STRENGTHS: (choose at least two) Average or above average intelligence General fund of knowledge  Allergies:  Allergies  Allergen  Reactions  . Aspirin Other (See Comments)    Aggravates ulcer. "Causes chest pain."  . Pepperoni [Pickled Meat] Other (See Comments)    aggrevates ulcer  . Tomato Other (See Comments)    Foods with tomato sauce aggrevate ulcers  . Tylenol [Acetaminophen] Other (See Comments)    Pt reports hx of ulcers    Home Medications:  (Not in a hospital admission)  OB/GYN Status:  No LMP for male patient.  General Assessment Data Location of Assessment: [facility] ED TTS Assessment: In system Is this a Tele or Face-to-Face Assessment?: Tele Assessment Is this an Initial Assessment or a Re-assessment for this encounter?: Initial Assessment Marital status: Single Living Arrangements: Parent Can pt return to current living arrangement?: Yes Admission Status: Voluntary Is patient capable of signing voluntary admission?: Yes Referral Source: Self/Family/Friend Insurance type: Medicaid     Crisis Care Plan Living Arrangements: Parent Name of Psychiatrist: None Name of Therapist: None  Education Status Is patient currently in school?: No Highest grade of school patient has completed: 11th  Risk to self with the past 6 months Suicidal Ideation: Yes-Currently Present Has patient been a risk to self within the past 6 months prior to admission? : Yes Suicidal Intent: Yes-Currently Present Has patient had any suicidal intent within the past 6 months prior to admission? : No Is patient at risk for suicide?: Yes Suicidal Plan?: No-Not Currently/Within Last 6 Months Has patient had any suicidal plan within the past 6 months prior to admission? : Yes Access to Means: No Previous Attempts/Gestures: Yes How many times?: 2 Other Self Harm Risks: h/o multiple inpatient admissions Triggers for Past Attempts: Family contact, Other (Comment) ("I had just lost a kid 47 months of age") Intentional Self Injurious Behavior: None Family Suicide History: No Recent stressful life event(s): Other (Comment) (break  up with significant other of 10yrs) Persecutory voices/beliefs?: No Depression: Yes Depression Symptoms: Feeling worthless/self pity, Loss of interest in usual pleasures, Guilt, Feeling angry/irritable, Isolating, Despondent, Tearfulness Substance abuse history and/or treatment for substance abuse?: Yes Suicide prevention information given to non-admitted patients: Not applicable  Risk to Others within the past 6 months Homicidal Ideation: No Does patient have any lifetime risk of violence toward others beyond the six months prior to admission? : No Thoughts of Harm to Others: No Current Homicidal Plan: No Access to Homicidal Means: No History of harm to others?: Yes ("years ago in the past") Assessment of Violence: None Noted Does patient have access to weapons?: No Criminal Charges Pending?: No Does patient have a court date: No Is patient on probation?: No  Psychosis Hallucinations: None noted Delusions: None noted  Mental Status Report Appearance/Hygiene: In scrubs Eye Contact: Good Motor Activity: Unremarkable Speech: Logical/coherent Level of Consciousness: Alert Mood: Depressed Affect: Depressed Anxiety Level: None Thought Processes: Coherent, Relevant Judgement: Partial Orientation: Person, Place, Time, Situation Obsessive Compulsive Thoughts/Behaviors: None  Cognitive Functioning Concentration:  Normal Memory: Recent Intact, Remote Intact IQ: Average Insight: Fair Impulse Control: Fair Appetite: Fair Weight Loss: 0 Weight Gain: 0 Sleep: No Change Total Hours of Sleep: 4 Vegetative Symptoms: Staying in bed  ADLScreening [facility] Assessment Services) Patient's cognitive ability adequate to safely complete daily activities?: Yes Patient able to express need for assistance with ADLs?: Yes Independently performs ADLs?: Yes (appropriate for developmental age)  Prior Inpatient Therapy Prior Inpatient Therapy: Yes Prior Therapy Dates: Multiple Prior Therapy  Facilty/Provider(s): [facility] Reason for Treatment: Alcohol dependency, SI, depression  Prior Outpatient Therapy Prior Outpatient Therapy: Yes Prior Therapy Dates: Not Reported Prior Therapy Facilty/Provider(s): [facility] Reason for Treatment: SA, Depression Does patient have an ACCT team?: Yes (Pt unable to recall ACTT provider) Does patient have Intensive In-House Services?  : No Does patient have [facility] services? : Yes Does patient have P4CC services?: No  ADL Screening (condition at time of admission) Patient's cognitive ability adequate to safely complete daily activities?: Yes Is the patient deaf or have difficulty hearing?: No Does the patient have difficulty seeing, even when wearing glasses/contacts?: No Does the patient have difficulty concentrating, remembering, or making decisions?: Yes Patient able to express need for assistance with ADLs?: Yes Independently performs ADLs?: Yes (appropriate for developmental age) Does the patient have difficulty walking or climbing stairs?: No Weakness of Legs: None Weakness of Arms/Hands: None  Home Assistive Devices/Equipment Home Assistive Devices/Equipment: None  Therapy Consults (therapy consults require a physician order) PT Evaluation Needed: No OT Evalulation Needed: No SLP Evaluation Needed: No Abuse/Neglect Assessment (Assessment to be complete while patient is alone) Physical Abuse: Denies Verbal Abuse: Denies Sexual Abuse: Denies Exploitation of patient/patient's resources: Denies Self-Neglect: Denies Values / Beliefs Cultural Requests During Hospitalization: None Spiritual Requests During Hospitalization: None Consults Spiritual Care Consult Needed: No Social Work Consult Needed: No Regulatory affairs officer (For Healthcare) Does Patient Have a Medical Advance Directive?: No Would patient like information on creating a medical advance directive?: No - Patient declined    Additional  Information 1:1 In Past 12 Months?: No CIRT Risk: No Elopement Risk: No Does patient have medical clearance?: No     Disposition: Clinician consulted with Patriciaann Clan, PA and pt meets criteria for inpatient admission. Clinician confirmed lack of [facility] bed availability with [facility]. TTS to seek placement. Monique, RN informed of pt disposition.  Disposition Initial Assessment Completed for this Encounter: Yes Disposition of Patient: Other dispositions Other disposition(s): Other (Comment) (pending psychiatric recommendation)  [name] [name] 05/18/2016 3:01 AM

## 2016-05-18 NOTE — ED Notes (Signed)
TTS in process 

## 2016-05-18 NOTE — Progress Notes (Signed)
Admission paperwork completed and signed.  Belongings searched and secured in locker # 25.  Skin assessment completed and noted scar on lower back (well healed) .  Q 15 minute checks initiated for safety.  We will monitor the progress towards his goals.  Patient was discharged from [facility] on 11.29.17.  He currently reports suicidal ideation with no specific plan.  Patient has hx of alcohol and cocaine use.  He was positive for THC and his BAL was 146.  Patient is bradycardic and usually BP is low.  Patient weighs 114.5 pounds.  Patient experiences dizziness from orthostatic hypotension.  Patient has an ACTT team, however, does not remember the name.  Patient has hx of CAD, chest pain, GERD and stomach ulcers.  Patient is allergic to tylenol and aspirin.  Patient currently lives with his parents and "other adults."  Patient has had several prior admissions to [facility].  Patient currently on [address] hall. He denies any auditory or visual hallucinations.

## 2016-05-19 DIAGNOSIS — Z79899 Other long term (current) drug therapy: Secondary | ICD-10-CM

## 2016-05-19 DIAGNOSIS — F332 Major depressive disorder, recurrent severe without psychotic features: Secondary | ICD-10-CM

## 2016-05-19 DIAGNOSIS — Z888 Allergy status to other drugs, medicaments and biological substances status: Secondary | ICD-10-CM

## 2016-05-19 DIAGNOSIS — F1099 Alcohol use, unspecified with unspecified alcohol-induced disorder: Secondary | ICD-10-CM

## 2016-05-19 DIAGNOSIS — Z9889 Other specified postprocedural states: Secondary | ICD-10-CM

## 2016-05-19 MED ORDER — QUETIAPINE FUMARATE 100 MG PO TABS
100.0000 mg | ORAL_TABLET | Freq: Every day | ORAL | Status: DC
  Administered 2016-05-19 – 2016-05-21 (×3): 100 mg via ORAL
  Filled 2016-05-19 (×6): qty 1

## 2016-05-19 MED ORDER — SERTRALINE HCL 25 MG PO TABS
25.0000 mg | ORAL_TABLET | Freq: Every day | ORAL | Status: DC
  Administered 2016-05-19 – 2016-05-20 (×2): 25 mg via ORAL
  Filled 2016-05-19 (×3): qty 1

## 2016-05-19 NOTE — Progress Notes (Signed)
DAR NOTE: Patient presents with anxious affect and depressed mood.  Denies pain, auditory and visual hallucinations.  Described energy level as normal and concentration as poor.  Rates depression at 5, hopelessness at 8, and anxiety at 6.  Maintained on routine safety checks.  Medications given as prescribed.  Support and encouragement offered as needed.  Attended group and participated.  States goal for today is "attending groups." Patient observed socializing with peers in the dayroom.  Offered no complaint.

## 2016-05-19 NOTE — [facility] Group Notes (Signed)
Amanda Park LCSW Group Therapy 05/19/2016 1:15 PM  Type of Therapy: Group Therapy- Emotion Regulation  Participation Level: Reserved   Participation Quality:  Appropriate  Affect: Appropriate  Cognitive: Alert and Oriented   Insight:  Developing/Improving  Engagement in Therapy: Developing/Improving and Engaged   Modes of Intervention: Clarification, Confrontation, Discussion, Education, Exploration, Limit-setting, Orientation, Problem-solving, Rapport Building, Art therapist, Socialization and Support  Summary of Progress/Problems: The topic for group today was emotional regulation. This group focused on both positive and negative emotion identification and allowed group members to process ways to identify feelings, regulate negative emotions, and find healthy ways to manage internal/external emotions. Group members were asked to reflect on a time when their reaction to an emotion led to a negative outcome and explored how alternative responses using emotion regulation would have benefited them. Group members were also asked to discuss a time when emotion regulation was utilized when a negative emotion was experienced. Pt expressed that he has learned how to tactfully express his emotions and stand up for himself over the years; however he reports he still struggles with coping with low self-esteem.   Adriana Reams, LCSW 05/19/2016 5:19 PM

## 2016-05-19 NOTE — Tx Team (Signed)
Interdisciplinary Treatment and Diagnostic Plan Update  05/19/2016 Time of Session: 5:17 PM  [name] [name] MRN: [id number]  Principal Diagnosis: MDD (major depressive disorder)  Secondary Diagnoses: Principal Problem:   MDD (major depressive disorder)   Current Medications:  Current Facility-Administered Medications  Medication Dose Route Frequency Provider Last Rate Last Dose  . alum & mag hydroxide-simeth (MAALOX/MYLANTA) 200-200-20 MG/5ML suspension 30 mL  30 mL Oral Q4H PRN Kerrie Buffalo, NP      . LORazepam (ATIVAN) tablet 0.5 mg  0.5 mg Oral Q6H PRN Kerrie Buffalo, NP      . magnesium hydroxide (MILK OF MAGNESIA) suspension 30 mL  30 mL Oral Daily PRN Kerrie Buffalo, NP      . methocarbamol (ROBAXIN) tablet 500 mg  500 mg Oral Q8H PRN Kerrie Buffalo, NP   500 mg at 05/18/16 1709  . nicotine (NICODERM CQ - dosed in mg/24 hours) patch 21 mg  21 mg Transdermal Daily Kerrie Buffalo, NP      . QUEtiapine (SEROQUEL) tablet 100 mg  100 mg Oral QHS Kerrie Buffalo, NP      . sertraline (ZOLOFT) tablet 25 mg  25 mg Oral Daily Kerrie Buffalo, NP   25 mg at 05/19/16 1712  . traZODone (DESYREL) tablet 50 mg  50 mg Oral QHS,MR X 1 Laverle Hobby, PA-C   50 mg at 05/18/16 2215    PTA Medications: Prescriptions Prior to Admission  Medication Sig Dispense Refill Last Dose  . nicotine (NICODERM CQ - DOSED IN MG/24 HOURS) 21 mg/24hr patch Place 1 patch (21 mg total) onto the skin daily. For smoking cessation 28 patch 0   . QUEtiapine (SEROQUEL) 100 MG tablet Take 1 tablet (100 mg total) by mouth at bedtime. For mood control 30 tablet 0   . sertraline (ZOLOFT) 25 MG tablet Take 1 tablet (25 mg total) by mouth daily. For depression 30 tablet 0     Treatment Modalities: Medication Management, Group therapy, Case management,  1 to 1 session with clinician, Psychoeducation, Recreational therapy.  Patient Stressors: Financial difficulties Health problems Substance abuse  Patient Strengths:  Capable of independent living Curator fund of knowledge Motivation for treatment/growth Supportive family/friends  Physician Treatment Plan for Primary Diagnosis: MDD (major depressive disorder) Long Term Goal(s): Improvement in symptoms so as ready for discharge  Short Term Goals: Ability to maintain clinical measurements within normal limits will improve Ability to disclose and discuss suicidal ideas Ability to identify and develop effective coping behaviors will improve Ability to identify triggers associated with substance abuse/mental health issues will improve  Medication Management: Evaluate patient's response, side effects, and tolerance of medication regimen.  Therapeutic Interventions: 1 to 1 sessions, Unit Group sessions and Medication administration.  Evaluation of Outcomes: Not Met  Physician Treatment Plan for Secondary Diagnosis: Principal Problem:   MDD (major depressive disorder)   Long Term Goal(s): Improvement in symptoms so as ready for discharge  Short Term Goals: Ability to maintain clinical measurements within normal limits will improve Ability to disclose and discuss suicidal ideas Ability to identify and develop effective coping behaviors will improve Ability to identify triggers associated with substance abuse/mental health issues will improve  Medication Management: Evaluate patient's response, side effects, and tolerance of medication regimen.  Therapeutic Interventions: 1 to 1 sessions, Unit Group sessions and Medication administration.  Evaluation of Outcomes: Not Met   RN Treatment Plan for Primary Diagnosis: MDD (major depressive disorder) Long Term Goal(s): Knowledge of disease and therapeutic regimen to  maintain health will improve  Short Term Goals: Ability to verbalize feelings will improve, Ability to disclose and discuss suicidal ideas and Ability to identify and develop effective coping behaviors will  improve  Medication Management: RN will administer medications as ordered by provider, will assess and evaluate patient's response and provide education to patient for prescribed medication. RN will report any adverse and/or side effects to prescribing provider.  Therapeutic Interventions: 1 on 1 counseling sessions, Psychoeducation, Medication administration, Evaluate responses to treatment, Monitor vital signs and CBGs as ordered, Perform/monitor CIWA, COWS, AIMS and Fall Risk screenings as ordered, Perform wound care treatments as ordered.  Evaluation of Outcomes: Not Met   LCSW Treatment Plan for Primary Diagnosis: MDD (major depressive disorder) Long Term Goal(s): Safe transition to appropriate next level of care at discharge, Engage patient in therapeutic group addressing interpersonal concerns.  Short Term Goals: Engage patient in aftercare planning with referrals and resources, Facilitate acceptance of mental health diagnosis and concerns, Identify triggers associated with mental health/substance abuse issues and Increase skills for wellness and recovery  Therapeutic Interventions: Assess for all discharge needs, 1 to 1 time with Social worker, Explore available resources and support systems, Assess for adequacy in community support network, Educate family and significant other(s) on suicide prevention, Complete Psychosocial Assessment, Interpersonal group therapy.  Evaluation of Outcomes: Not Met   Progress in Treatment: Attending groups: Pt is new to milieu, continuing to assess  Participating in groups: Pt is new to milieu, continuing to assess  Taking medication as prescribed: Yes, MD continues to assess for medication changes as needed Toleration medication: Yes, no side effects reported at this time Family/Significant other contact made: No, CSW assessing for appropriate contact Patient understands diagnosis: Continuing to assess Discussing patient identified problems/goals  with staff: Yes Medical problems stabilized or resolved: Yes Denies suicidal/homicidal ideation: Yes Issues/concerns per patient self-inventory: None Other: N/A  New problem(s) identified: None identified at this time.   New Short Term/Long Term Goal(s): None identified at this time.   Discharge Plan or Barriers: Pt requesting referral to [facility] for continued substance abuse treatment.   Reason for Continuation of Hospitalization: Anxiety Depression Medication stabilization Suicidal ideation  Estimated Length of Stay: 3-5 days  Attendees: Patient: 05/19/2016  5:17 PM  Physician: Dr. Parke Poisson 05/19/2016  5:17 PM  Nursing: Lawerance Sabal, RN 05/19/2016  5:17 PM  RN Care Manager: Lars Pinks, RN 05/19/2016  5:17 PM  Social Worker: Adriana Reams, LCSW; Erasmo Downer Drinkard, LCSW 05/19/2016  5:17 PM  Recreational Therapist:  05/19/2016  5:17 PM  Other: Lindell Spar, NP; Samuel Jester, NP 05/19/2016  5:17 PM  Other:  05/19/2016  5:17 PM  Other: 05/19/2016  5:17 PM    Scribe for Treatment Team: Gladstone Lighter, LCSW 05/19/2016 5:17 PM
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
## 2016-05-19 NOTE — BHH Suicide Risk Assessment (Signed)
[facility] Admission Suicide Risk Assessment   Nursing information obtained from:  Patient Demographic factors:  Male, Low socioeconomic status Current Mental Status:  Suicidal ideation indicated by patient Loss Factors:  Financial problems / change in socioeconomic status Historical Factors:  Impulsivity Risk Reduction Factors:  Sense of responsibility to family  Total Time spent with patient: 45 minutes Principal Problem: <principal problem not specified> Diagnosis:   Patient Active Problem List   Diagnosis Date Noted  . MDD (major depressive disorder) [F32.9] 05/18/2016  . Affective psychosis, bipolar ([place]) [F31.9] 04/30/2016  . Cocaine abuse with cocaine-induced mood disorder ([place]) [F14.14] 03/04/2016  . Alcohol dependence with uncomplicated withdrawal ([place]) [F10.230]   . Bipolar affective disorder, depressed, severe ([place]) [F31.4] 11/22/2015  . Alcohol use disorder, severe, dependence ([place]) [F10.20] 01/18/2015  . Alcohol dependence with alcohol-induced mood disorder ([place]) [F10.24]   . Suicidal ideation [R45.851]   . Acute pancreatitis [K85.90] 03/20/2012  . Polysubstance abuse [F19.10] 03/20/2012  . Cocaine abuse [F14.10] 01/12/2012  . Cannabis abuse [F12.10] 01/12/2012  . Alcohol dependence ([place]) [F10.20] 10/15/2011  . Abnormal ECG [R94.31]   . Chest pain [R07.89]   . CAD in native artery [I25.10]   . GERD (gastroesophageal reflux disease) [K21.9]   . Tobacco abuse [Z72.0]   . History of cocaine abuse [Z87.898]   . History of ETOH abuse [Z87.898]   . Bradycardia [R00.1]   . Syncope [R55]      Continued Clinical Symptoms:  Alcohol Use Disorder Identification Test Final Score (AUDIT): 15 The "Alcohol Use Disorders Identification Test", Guidelines for Use in Primary Care, Second Edition.  World Pharmacologist [facility]). Score between 0-7:  no or low risk or alcohol related problems. Score between 8-15:  moderate risk of alcohol related problems. Score between 16-19:  high  risk of alcohol related problems. Score 20 or above:  warrants further diagnostic evaluation for alcohol dependence and treatment.   CLINICAL FACTORS:  HPI-50 year old male , presented to ED due to worsening depression, suicidal ideations. States " it was my birthday, and it made me feel even worse , so I decided I needed help". Reports he has not been taking any psychiatric medications recently. Endorses neuro-vegetative symptoms of depression- poor appetite, with unspecified weight loss, poor energy level, poor sleep. Denies any psychotic symptoms. He has a history of substance abuse ( cocaine, alcohol,cannabis ), states he had been sober until day of admission, when he did drink . Has not used cocaine recently . Patient had been recently admitted to [facility] from 11/24- 05/05/16 due to depression and suicidal ideations. At the time was diagnosed with Bipolar Disorder. He was discharged on low dose Zoloft and Seroquel. He has been non compliant with medications .  Psychiatric history- is remarkable for prior history of bipolar disorder, prior psychiatric admissions, most recently November/17. History of suicide attempts by walking into traffic, overdosing in the late 90s. States he has been diagnosed with Bipolar Disorder , and describes brief  episodes of increased energy, although stresses depression as major issue.  Substance Abuse history- history of alcohol dependence, history of cocaine abuse. No IVDA.  Medical History- history of CAD  Social History - single, has 7 adult children, lives with parents, on SSI, no legal issues  Dx- Bipolar Disorder, Depressed  Plan- inpatient admission- we discussed options, agrees to restart Seroquel and Zoloft , which were well tolerated and which he felt were helping .    Musculoskeletal: Strength & Muscle Tone: within normal limits Gait &  Station: normal Patient leans: N/A  Psychiatric Specialty Exam: Physical Exam  ROS no headache, no chest pain  , no shortness of breath, no nausea, no vomiting  Blood pressure 108/63, pulse 61, temperature 98.2 F (36.8 C), temperature source Oral, resp. rate 18, height 5\' 4"  (1.626 m), weight 114 lb 8 oz (51.9 kg), SpO2 100 %.Body mass index is 19.65 kg/m.  General Appearance: Fairly Groomed  Eye Contact:  Fair  Speech:  Normal Rate  Volume:  Decreased  Mood:  Depressed  Affect:  constricted, but does smile at times appropriately  Thought Process:  Linear  Orientation:  Full (Time, Place, and Person)  Thought Content:  denies hallucinations, no delusions, not internally preoccupied   Suicidal Thoughts:  No denies any suicidal or self injurious ideations, contracts for safety on the unit   Homicidal Thoughts:  No denies any homicidal or violent ideations  Memory:  recent and remote grossly intact   Judgement:  Fair  Insight:  Present  Psychomotor Activity:  Normal no tremors , no diaphoresis, no restlessness   Concentration:  Concentration: Good and Attention Span: Good  Recall:  Good  Fund of Knowledge:  Good  Language:  Good  Akathisia:  Negative  Handed:  Right  AIMS (if indicated):     Assets:  Communication Skills Desire for Improvement Resilience  ADL's:  Intact  Cognition:  WNL  Sleep:  Number of Hours: 6.75      COGNITIVE FEATURES THAT CONTRIBUTE TO RISK:  Closed-mindedness and Loss of executive function    SUICIDE RISK:   Moderate:  Frequent suicidal ideation with limited intensity, and duration, some specificity in terms of plans, no associated intent, good self-control, limited dysphoria/symptomatology, some risk factors present, and identifiable protective factors, including available and accessible social support.   PLAN OF CARE: Patient will be admitted to inpatient psychiatric unit for stabilization and safety. Will provide and encourage milieu participation. Provide medication management and maked adjustments as needed.  Will follow daily.    I certify that  inpatient services furnished can reasonably be expected to improve the patient's condition.  Neita Garnet, MD 05/19/2016, 3:51 PM
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
## 2016-05-19 NOTE — Progress Notes (Signed)
Recreation Therapy Notes  Date: 05/19/16 Time: 0930 Location: 300 Hall Dayroom  Group Topic: Stress Management  Goal Area(s) Addresses:  Patient will verbalize importance of using healthy stress management.  Patient will identify positive emotions associated with healthy stress management.   Intervention: Calm App  Activity :  Letting Go Meditation.  LRT introduced the stress management technique of meditation.  LRT played the meditation to allow patients to fully engage in the meditation.  Patients were to follow along as meditation played.   Education:  Stress Management, Discharge Planning.   Education Outcome: Acknowledges edcuation/In group clarification offered/Needs additional education  Clinical Observations/Feedback: Pt did not attend group.   Victorino Sparrow, LRT/CTRS         Victorino Sparrow A 05/19/2016 12:38 PM

## 2016-05-20 MED ORDER — TRAZODONE HCL 50 MG PO TABS
50.0000 mg | ORAL_TABLET | Freq: Every evening | ORAL | Status: DC | PRN
  Administered 2016-05-20 – 2016-05-21 (×2): 50 mg via ORAL

## 2016-05-20 MED ORDER — SERTRALINE HCL 50 MG PO TABS
50.0000 mg | ORAL_TABLET | Freq: Every day | ORAL | Status: DC
  Administered 2016-05-21 – 2016-05-23 (×3): 50 mg via ORAL
  Filled 2016-05-20 (×6): qty 1

## 2016-05-20 MED ORDER — ENSURE ENLIVE PO LIQD
237.0000 mL | Freq: Two times a day (BID) | ORAL | Status: DC
  Administered 2016-05-20 – 2016-05-23 (×4): 237 mL via ORAL

## 2016-05-20 NOTE — Progress Notes (Signed)
[facility] MD Progress Note  05/20/2016 2:22 PM [name] [name]  MRN:  [id number] Subjective: Reports feeling better than prior to admission. At this time denies suicidal ideations . Denies medication side effects. Objective : I have discussed case with treatment team and have met with patient . He presents improved compared to admission- more reactive affect. At this time denies any suicidal ideations. No alcohol withdrawal symptoms. He is future oriented and is hoping to be able to go to a residential rehab setting such as [facility] on discharge. Denies medication side effects. Visible on unit , going to groups, no disruptive or agitated behaviors .  Principal Problem: MDD (major depressive disorder) Diagnosis:   Patient Active Problem List   Diagnosis Date Noted  . MDD (major depressive disorder) [F32.9] 05/18/2016  . Affective psychosis, bipolar ([place]) [F31.9] 04/30/2016  . Cocaine abuse with cocaine-induced mood disorder ([place]) [F14.14] 03/04/2016  . Alcohol dependence with uncomplicated withdrawal ([place]) [F10.230]   . Bipolar affective disorder, depressed, severe ([place]) [F31.4] 11/22/2015  . Alcohol use disorder, severe, dependence ([place]) [F10.20] 01/18/2015  . Alcohol dependence with alcohol-induced mood disorder ([place]) [F10.24]   . Suicidal ideation [R45.851]   . Acute pancreatitis [K85.90] 03/20/2012  . Polysubstance abuse [F19.10] 03/20/2012  . Cocaine abuse [F14.10] 01/12/2012  . Cannabis abuse [F12.10] 01/12/2012  . Alcohol dependence ([place]) [F10.20] 10/15/2011  . Abnormal ECG [R94.31]   . Chest pain [R07.89]   . CAD in native artery [I25.10]   . GERD (gastroesophageal reflux disease) [K21.9]   . Tobacco abuse [Z72.0]   . History of cocaine abuse [Z87.898]   . History of ETOH abuse [Z87.898]   . Bradycardia [R00.1]   . Syncope [R55]    Total Time spent with patient: 20 minutes   Past Medical History:  Past Medical History:  Diagnosis Date  . Abnormal ECG    a. early  repolarization  . Arthritis    "my whole left side" (05/03/2014)  . Bipolar disorder ([place])   . Bradycardia    a. asymptomatic  . CAD in native artery    a. Nonobstructive cath 11/2007;  b. Presented with ST elevation - Nonobstructive cath 08/2011  . Chest pain, mid sternal   . Coronary artery disease   . GERD (gastroesophageal reflux disease)   . History of cocaine abuse    a. quit ? 2009  . History of ETOH abuse    a. drinks 2 "40's" / wk  . Marijuana abuse    a. uses ~ 1x /wk or less  . Pneumonia   . Stomach ulcer   . Syncope    a. 12/2010 - presumed to be vasovagal  . Tobacco abuse     Past Surgical History:  Procedure Laterality Date  . CARDIAC CATHETERIZATION  2009; 08/2011   [name] 08/06/2011  . LEFT HEART CATHETERIZATION WITH CORONARY ANGIOGRAM N/A 08/16/2011   Procedure: LEFT HEART CATHETERIZATION WITH CORONARY ANGIOGRAM;  Surgeon: [name], MD;  Location: [facility] CATH LAB;  Service: Cardiovascular;  Laterality: N/A;   Family History: History reviewed. No pertinent family history.  Social History:  History  Alcohol Use  . 28.8 oz/week  . 48 Cans of beer per week    Comment: 2 40 oz beers per day     History  Drug Use  . Types: Marijuana, Cocaine    Social History   Social History  . Marital status: Single    Spouse name: N/A  . Number of children: N/A  . Years  of education: N/A   Social History Main Topics  . Smoking status: Current Some Day Smoker    Packs/day: 0.50    Years: 20.00    Types: Cigarettes    Last attempt to quit: 06/24/2015  . Smokeless tobacco: Never Used  . Alcohol use 28.8 oz/week    48 Cans of beer per week     Comment: 2 40 oz beers per day  . Drug use:     Types: Marijuana, Cocaine  . Sexual activity: Yes   Other Topics Concern  . None   Social History Narrative   Lives with his daughter currently and receives SSI.    Additional Social History:    Pain Medications: Pt denies abuse. Prescriptions: Pt denies  abuse. Over the Counter: Pt denies abuse. History of alcohol / drug use?: Yes Longest period of sobriety (when/how long): Not Reported Negative Consequences of Use: Personal relationships Name of Substance 1: Alcohol 1 - Age of First Use: Not Reported 1 - Amount (size/oz): Pt reported, taking three shots of liquor and drinking beer.  1 - Frequency: when able to obtain it, typically weekly 1 - Duration: UTA 1 - Last Use / Amount: 12.11.17 Name of Substance 2: Marijuana 2 - Age of First Use: UTA 2 - Amount (size/oz): Pt reported smoking from 1000 to 1400 on Thanksgiving Day. Pt reported smoking one blunt.  2 - Frequency: Every other day 2 - Duration: UTA 2 - Last Use / Amount: 12.11.17/ S10 worth Name of Substance 3: Crack Cocaine 3 - Age of First Use: 14 3 - Amount (size/oz): 1-2 40-ounce beers 3 - Frequency: "anyime I could get it, basically once or twice a month" 3 - Last Use / Amount: "over one month ago"  Sleep: improved  Appetite:  improved  Current Medications: Current Facility-Administered Medications  Medication Dose Route Frequency Provider Last Rate Last Dose  . alum & mag hydroxide-simeth (MAALOX/MYLANTA) 200-200-20 MG/5ML suspension 30 mL  30 mL Oral Q4H PRN Kerrie Buffalo, NP      . feeding supplement (ENSURE ENLIVE) (ENSURE ENLIVE) liquid 237 mL  237 mL Oral BID BM Myer Peer Ambre Kobayashi, MD      . LORazepam (ATIVAN) tablet 0.5 mg  0.5 mg Oral Q6H PRN Kerrie Buffalo, NP      . magnesium hydroxide (MILK OF MAGNESIA) suspension 30 mL  30 mL Oral Daily PRN Kerrie Buffalo, NP      . methocarbamol (ROBAXIN) tablet 500 mg  500 mg Oral Q8H PRN Kerrie Buffalo, NP   500 mg at 05/18/16 1709  . nicotine (NICODERM CQ - dosed in mg/24 hours) patch 21 mg  21 mg Transdermal Daily Kerrie Buffalo, NP      . QUEtiapine (SEROQUEL) tablet 100 mg  100 mg Oral QHS Kerrie Buffalo, NP   100 mg at 05/19/16 2108  . [START ON 05/21/2016] sertraline (ZOLOFT) tablet 50 mg  50 mg Oral Daily Myer Peer  Meika Earll, MD      . traZODone (DESYREL) tablet 50 mg  50 mg Oral QHS,MR X 1 Laverle Hobby, PA-C   50 mg at 05/19/16 2109    Lab Results: No results found for this or any previous visit (from the past 27 hour(s)).  Blood Alcohol level:  Lab Results  Component Value Date   ETH 146 (H) 05/17/2016   ETH 222 (H) 23/53/6144    Metabolic Disorder Labs: Lab Results  Component Value Date   HGBA1C 4.9 05/04/2016   MPG 94 05/04/2016  MPG 82 05/03/2014   No results found for: PROLACTIN Lab Results  Component Value Date   CHOL 170 05/04/2016   TRIG 155 (H) 05/04/2016   HDL 65 05/04/2016   CHOLHDL 2.6 05/04/2016   VLDL 31 05/04/2016   LDLCALC 74 05/04/2016   LDLCALC 42 05/03/2014    Physical Findings: AIMS: Facial and Oral Movements Muscles of Facial Expression: None, normal Lips and Perioral Area: None, normal Jaw: None, normal Tongue: None, normal,Extremity Movements Upper (arms, wrists, hands, fingers): None, normal Lower (legs, knees, ankles, toes): None, normal, Trunk Movements Neck, shoulders, hips: None, normal, Overall Severity Severity of abnormal movements (highest score from questions above): None, normal Incapacitation due to abnormal movements: None, normal Patient's awareness of abnormal movements (rate only patient's report): No Awareness, Dental Status Current problems with teeth and/or dentures?: Yes Does patient usually wear dentures?: No  CIWA:  CIWA-Ar Total: 4 COWS:     Musculoskeletal: Strength & Muscle Tone: within normal limits Gait & Station: normal Patient leans: N/A  Psychiatric Specialty Exam: Physical Exam  ROS denies chest pain, no shortness of breath, no vomiting   Blood pressure 109/69, pulse (!) 54, temperature 97.3 F (36.3 C), temperature source Oral, resp. rate 18, height 5' 4"  (1.626 m), weight 114 lb 8 oz (51.9 kg), SpO2 100 %.Body mass index is 19.65 kg/m.  General Appearance: Fairly Groomed  Eye Contact:  Fair  Speech:  Normal  Rate  Volume:  Normal  Mood:  improved, less depressed   Affect:  appropriate, reactive  Thought Process:  Linear  Orientation:  Full (Time, Place, and Person)  Thought Content:  denies hallucinations, no delusions, not internally preoccupied   Suicidal Thoughts:  No denies suicidal or self injurious ideations, denies any homicidal or violent ideations  Homicidal Thoughts:  No  Memory:  recent and remote grossly intact   Judgement:  Other:  improving  Insight:  improving  Psychomotor Activity:  Normal- no tremors, no diaphoresis, no restlessness   Concentration:  Concentration: Good and Attention Span: Good  Recall:  Good  Fund of Knowledge:  Good  Language:  Good  Akathisia:  Negative  Handed:  Right  AIMS (if indicated):     Assets:  Communication Skills Desire for Improvement Resilience  ADL's:  Intact  Cognition:  WNL  Sleep:  Number of Hours: 6.75   Assessment- improving mood and range of affect, no SI at this time, tolerating medications well ( Seroquel, Zoloft) , denies side effects. No withdrawal symptoms. Focused on going to a residential rehab setting on discharge in order to work on recovery and decrease risk of relapse.   Treatment Plan Summary: Daily contact with patient to assess and evaluate symptoms and progress in treatment, Medication management, Plan inpatient treatment  and medications as below Encourage group and milieu participation to work on coping skills and symptom reduction Treatment team working on disposition planning- as noted, patient wants to go to a residential rehab program Continue  Seroquel 50 mgrs QHS for mood disorder, insomnia Continue Trazodone 50 mgrs QHS PRN for insomnia as needed  Continue Ativan 0.5 mgrs Q 6 hours PRN for severe anxiety, as needed  Increase Zoloft to 50 mgrs QDAY for depression. Neita Garnet, MD 05/20/2016, 2:22 PM

## 2016-05-20 NOTE — Progress Notes (Signed)
Patient has been isolative to room for the majority of the shift.  Patient states that he has felt light headed due to the medications and would like to rest.  Patient denies SI, HI and AVH at this time.   Assess patient for safety, offer medications as prescribed, engage patient in 1:1 staff talks.   Continue to monitor as prescribed.

## 2016-05-20 NOTE — Progress Notes (Signed)
Patient reported to MHT that he felt "light headed and dizzy". Writer assessed patient in his bed. Gatorade given and fluids encouraged. VSS. Patient reports "maybe it's from the medicine". No other symptoms at this time. Patient in no acute distress. Patient encouraged to stay in bed and stay back from breakfast if he felt unsafe. Fall safety precautions reviewed with patient. Will continue to monitor and report to next shift.

## 2016-05-20 NOTE — Progress Notes (Signed)
Nursing Progress Note 7p-7a  D) Patient presents with brightened affect and is pleasant. Patient reports having a "better day" and "feeling better as the day went on". Patient denies SI/HI/AVH or pain. Patient contracts for safety at this time. Patient without issues or concerns.  A) Patient medicated as prescribed. Emotional support given. Patient on q15 min safety checks. Opportunities for questions or concerns presented to patient. Patient encouraged to work on goals of treatment.  R) Patient receptive to interaction with nurse. Patient remains safe on the unit at this time. Patient is resting in bed without complaints. Will continue to monitor.

## 2016-05-20 NOTE — Plan of Care (Signed)
Problem: Medication: Goal: Compliance with prescribed medication regimen will improve Outcome: Progressing Patient taking medications as prescribed. Patient without issues or complaints. Opportunities for questions or concerns provided to patient.

## 2016-05-20 NOTE — BHH Group Notes (Addendum)
The focus of this group is to educate the patient on the purpose and policies of crisis stabilization and provide a format to answer questions about their admission.  The group details unit policies and expectations of patients while admitted.  Patient did not attend 0900 nurse education orientation group this morning.  Patient stayed in room.  

## 2016-05-20 NOTE — Plan of Care (Signed)
Problem: Self-Concept: Goal: Ability to verbalize positive feelings about self will improve Outcome: Progressing Patient states "I am worth getting help" and maintains a positive attitude when discussing himself and his need for treatment as well as progress thus far.

## 2016-05-20 NOTE — Progress Notes (Signed)
Nursing Progress Note 7p-7a  D) Patient presents with bright affect and is smiling and animated. Patient reports having a "great day". Patient states that he feels he is making progress and that he "brought it all this time" to really try hard and get help. Patient states "i know I am incontrol this time, not the alcohol". Patient denies SI/HI/AVH or pain. Patient contracts for safety at this time.   A) Emotional support and encouragement given. Patient medicated as prescribed without issue. Patient on q15 min safety checks. Opportunities for questions or concerns presented to patient. Patient encouraged to continue to continue to comply with the goals of treatment.  R) Patient receptive to interaction with nurse. Patient remains safe on the unit at this time. Patient is resting in bed without complaints. Will continue to monitor.

## 2016-05-20 NOTE — BHH Group Notes (Signed)
[facility] [employer] Group Therapy 05/20/2016 1:15pm  Type of Therapy: Mental Health Association Presentation  Pt did not attend, declined invitation.   Adriana Reams, LCSW 05/20/2016 1:17 PM

## 2016-05-21 NOTE — Progress Notes (Signed)
[facility] MD Progress Note  05/21/2016 3:21 PM SUMNER KIRCHMAN  MRN:  [id number] Subjective: reports he is feeling " all right ". Denies medication side effects. Remains future oriented and states he is apprehensive but excited about going to a residential rehab setting on discharge. Denies medication side effects. Denies cravings for alcohol at this time and reports he feels motivated in sobriety. Objective : I have discussed case with treatment team and have met with patient . Visible on unit, going to groups, no disruptive or agitated behaviors. No medication side effects. States yesterday he felt vaguely dizzy after taking medications but by today it has resolved . Today spoke at more length about prior triggers for relapse, which at times involve feeling frustrated about family dynamics and lack of personal time or space ( lives with his parents, sister, nephews) , and interacting with friend and associates who are actively drinking. At this time he denies cravings. We have reviewed importance of staying away from people, places and situations he associates with alcohol abuse, and have encouraged ongoing 12 step group participation after discharge. Denies any suicidal ideations.  Principal Problem: MDD (major depressive disorder) Diagnosis:   Patient Active Problem List   Diagnosis Date Noted  . MDD (major depressive disorder) [F32.9] 05/18/2016  . Affective psychosis, bipolar ([place]) [F31.9] 04/30/2016  . Cocaine abuse with cocaine-induced mood disorder ([place]) [F14.14] 03/04/2016  . Alcohol dependence with uncomplicated withdrawal ([place]) [F10.230]   . Bipolar affective disorder, depressed, severe ([place]) [F31.4] 11/22/2015  . Alcohol use disorder, severe, dependence ([place]) [F10.20] 01/18/2015  . Alcohol dependence with alcohol-induced mood disorder ([place]) [F10.24]   . Suicidal ideation [R45.851]   . Acute pancreatitis [K85.90] 03/20/2012  . Polysubstance abuse [F19.10] 03/20/2012  . Cocaine abuse  [F14.10] 01/12/2012  . Cannabis abuse [F12.10] 01/12/2012  . Alcohol dependence ([place]) [F10.20] 10/15/2011  . Abnormal ECG [R94.31]   . Chest pain [R07.89]   . CAD in native artery [I25.10]   . GERD (gastroesophageal reflux disease) [K21.9]   . Tobacco abuse [Z72.0]   . History of cocaine abuse [Z87.898]   . History of ETOH abuse [Z87.898]   . Bradycardia [R00.1]   . Syncope [R55]    Total Time spent with patient: 20 minutes   Past Medical History:  Past Medical History:  Diagnosis Date  . Abnormal ECG    a. early repolarization  . Arthritis    "my whole left side" (05/03/2014)  . Bipolar disorder ([place])   . Bradycardia    a. asymptomatic  . CAD in native artery    a. Nonobstructive cath 11/2007;  b. Presented with ST elevation - Nonobstructive cath 08/2011  . Chest pain, mid sternal   . Coronary artery disease   . GERD (gastroesophageal reflux disease)   . History of cocaine abuse    a. quit ? 2009  . History of ETOH abuse    a. drinks 2 "40's" / wk  . Marijuana abuse    a. uses ~ 1x /wk or less  . Pneumonia   . Stomach ulcer   . Syncope    a. 12/2010 - presumed to be vasovagal  . Tobacco abuse     Past Surgical History:  Procedure Laterality Date  . CARDIAC CATHETERIZATION  2009; 08/2011   [name] 08/06/2011  . LEFT HEART CATHETERIZATION WITH CORONARY ANGIOGRAM N/A 08/16/2011   Procedure: LEFT HEART CATHETERIZATION WITH CORONARY ANGIOGRAM;  Surgeon: [name], MD;  Location: [facility] CATH LAB;  Service: Cardiovascular;  Laterality: N/A;   Family History: History reviewed. No pertinent family history.  Social History:  History  Alcohol Use  . 28.8 oz/week  . 48 Cans of beer per week    Comment: 2 40 oz beers per day     History  Drug Use  . Types: Marijuana, Cocaine    Social History   Social History  . Marital status: Single    Spouse name: N/A  . Number of children: N/A  . Years of education: N/A   Social History Main Topics  . Smoking status:  Current Some Day Smoker    Packs/day: 0.50    Years: 20.00    Types: Cigarettes    Last attempt to quit: 06/24/2015  . Smokeless tobacco: Never Used  . Alcohol use 28.8 oz/week    48 Cans of beer per week     Comment: 2 40 oz beers per day  . Drug use:     Types: Marijuana, Cocaine  . Sexual activity: Yes   Other Topics Concern  . None   Social History Narrative   Lives with his daughter currently and receives SSI.    Additional Social History:    Pain Medications: Pt denies abuse. Prescriptions: Pt denies abuse. Over the Counter: Pt denies abuse. History of alcohol / drug use?: Yes Longest period of sobriety (when/how long): Not Reported Negative Consequences of Use: Personal relationships Name of Substance 1: Alcohol 1 - Age of First Use: Not Reported 1 - Amount (size/oz): Pt reported, taking three shots of liquor and drinking beer.  1 - Frequency: when able to obtain it, typically weekly 1 - Duration: UTA 1 - Last Use / Amount: 12.11.17 Name of Substance 2: Marijuana 2 - Age of First Use: UTA 2 - Amount (size/oz): Pt reported smoking from 1000 to 1400 on Thanksgiving Day. Pt reported smoking one blunt.  2 - Frequency: Every other day 2 - Duration: UTA 2 - Last Use / Amount: 12.11.17/ S10 worth Name of Substance 3: Crack Cocaine 3 - Age of First Use: 14 3 - Amount (size/oz): 1-2 40-ounce beers 3 - Frequency: "anyime I could get it, basically once or twice a month" 3 - Last Use / Amount: "over one month ago"  Sleep: improved  Appetite:  improved  Current Medications: Current Facility-Administered Medications  Medication Dose Route Frequency Provider Last Rate Last Dose  . alum & mag hydroxide-simeth (MAALOX/MYLANTA) 200-200-20 MG/5ML suspension 30 mL  30 mL Oral Q4H PRN Kerrie Buffalo, NP      . feeding supplement (ENSURE ENLIVE) (ENSURE ENLIVE) liquid 237 mL  237 mL Oral BID BM Myer Peer Cobos, MD   237 mL at 05/21/16 1512  . LORazepam (ATIVAN) tablet 0.5 mg   0.5 mg Oral Q6H PRN Kerrie Buffalo, NP      . magnesium hydroxide (MILK OF MAGNESIA) suspension 30 mL  30 mL Oral Daily PRN Kerrie Buffalo, NP      . methocarbamol (ROBAXIN) tablet 500 mg  500 mg Oral Q8H PRN Kerrie Buffalo, NP   500 mg at 05/18/16 1709  . nicotine (NICODERM CQ - dosed in mg/24 hours) patch 21 mg  21 mg Transdermal Daily Kerrie Buffalo, NP      . QUEtiapine (SEROQUEL) tablet 100 mg  100 mg Oral QHS Kerrie Buffalo, NP   100 mg at 05/20/16 2120  . sertraline (ZOLOFT) tablet 50 mg  50 mg Oral Daily Jenne Campus, MD   50 mg at 05/21/16 0810  .  traZODone (DESYREL) tablet 50 mg  50 mg Oral QHS PRN Jenne Campus, MD   50 mg at 05/20/16 2120    Lab Results: No results found for this or any previous visit (from the past 48 hour(s)).  Blood Alcohol level:  Lab Results  Component Value Date   ETH 146 (H) 05/17/2016   ETH 222 (H) 35/32/9924    Metabolic Disorder Labs: Lab Results  Component Value Date   HGBA1C 4.9 05/04/2016   MPG 94 05/04/2016   MPG 82 05/03/2014   No results found for: PROLACTIN Lab Results  Component Value Date   CHOL 170 05/04/2016   TRIG 155 (H) 05/04/2016   HDL 65 05/04/2016   CHOLHDL 2.6 05/04/2016   VLDL 31 05/04/2016   LDLCALC 74 05/04/2016   LDLCALC 42 05/03/2014    Physical Findings: AIMS: Facial and Oral Movements Muscles of Facial Expression: None, normal Lips and Perioral Area: None, normal Jaw: None, normal Tongue: None, normal,Extremity Movements Upper (arms, wrists, hands, fingers): None, normal Lower (legs, knees, ankles, toes): None, normal, Trunk Movements Neck, shoulders, hips: None, normal, Overall Severity Severity of abnormal movements (highest score from questions above): None, normal Incapacitation due to abnormal movements: None, normal Patient's awareness of abnormal movements (rate only patient's report): No Awareness, Dental Status Current problems with teeth and/or dentures?: Yes Does patient usually wear  dentures?: No  CIWA:  CIWA-Ar Total: 4 COWS:     Musculoskeletal: Strength & Muscle Tone: within normal limits Gait & Station: normal Patient leans: N/A  Psychiatric Specialty Exam: Physical Exam  ROS denies chest pain, no shortness of breath, no vomiting   Blood pressure (!) 104/54, pulse (!) 52, temperature 99.1 F (37.3 C), temperature source Oral, resp. rate 18, height 5' 4"  (1.626 m), weight 114 lb 8 oz (51.9 kg), SpO2 100 %.Body mass index is 19.65 kg/m.  General Appearance: improved grooming   Eye Contact:  improving   Speech:  Normal Rate  Volume:  Normal  Mood: gradually improving mood   Affect:  Appropriate, reactive   Thought Process:  Linear  Orientation:  Full (Time, Place, and Person)  Thought Content:  denies hallucinations, no delusions, not internally preoccupied   Suicidal Thoughts:  No denies suicidal or self injurious ideations, denies any homicidal or violent ideations  Homicidal Thoughts:  No  Memory:  recent and remote grossly intact   Judgement:  Other:  improving  Insight:  improving  Psychomotor Activity:  Normal- no tremors, no diaphoresis, no restlessness   Concentration:  Concentration: Good and Attention Span: Good  Recall:  Good  Fund of Knowledge:  Good  Language:  Good  Akathisia:  Negative  Handed:  Right  AIMS (if indicated):     Assets:  Communication Skills Desire for Improvement Resilience  ADL's:  Intact  Cognition:  WNL  Sleep:  Number of Hours: 6.75   Assessment- mood has improved gradually compared to admission. At this time denies suicidal ideations and is future oriented. Plans to go to a residential rehab setting ( [facility]) on discharge in order to focus on sobriety. Gaining insight into triggers for relapse. Denies medication side effects.  Treatment Plan Summary: Daily contact with patient to assess and evaluate symptoms and progress in treatment, Medication management, Plan inpatient treatment  and medications as  below Encourage group and milieu participation to work on coping skills and symptom reduction Treatment team working on disposition planning- as noted, patient wants to go to a residential rehab program Continue  Seroquel 50 mgrs QHS for mood disorder, insomnia Continue Trazodone 50 mgrs QHS PRN for insomnia as needed  Continue Ativan 0.5 mgrs Q 6 hours PRN for severe anxiety, as needed  Continue Zoloft  50 mgrs QDAY for depression. Neita Garnet, MD 05/21/2016, 3:21 PM   Patient ID: [name], male   DOB: 06-06-1966, 50 y.o.   MRN: [id number]

## 2016-05-21 NOTE — BHH Group Notes (Signed)
[facility] LCSW Group Therapy 05/21/2016 1:15pm  Type of Therapy: Group Therapy- Feelings Around Relapse and Recovery  Participation Level: Active   Participation Quality:  Appropriate  Affect:  Appropriate  Cognitive: Alert and Oriented   Insight:  Developing   Engagement in Therapy: Developing/Improving and Engaged   Modes of Intervention: Clarification, Confrontation, Discussion, Education, Exploration, Limit-setting, Orientation, Problem-solving, Rapport Building, Art therapist, Socialization and Support  Summary of Progress/Problems: The topic for today was feelings about relapse. The group discussed what relapse prevention is to them and identified triggers that they are on the path to relapse. Members also processed their feeling towards relapse and were able to relate to common experiences. Group also discussed coping skills that can be used for relapse prevention.  Pt identified drinking and his anger as behaviors with potential for relapse. Pt reports that feeling like he has no voice in a situation is a trigger for him. Pt discussed how frustrating it is in his living situation as he is judged and not given a voice. Pt reports recognizing that drinking is only a temporary solution that makes his problems worse.   Therapeutic Modalities:   Cognitive Behavioral Therapy Solution-Focused Therapy Assertiveness Training Relapse Prevention Therapy    Gaspar Cola [phone] 05/21/2016 3:08 PM

## 2016-05-21 NOTE — Progress Notes (Signed)
Vitals entered on behalf on night shift MHT.

## 2016-05-21 NOTE — Progress Notes (Signed)
Per Shayla at [facility], [place] has bed available on Sunday. [facility] will pick up at 1:00pm. Pt will need a 21-day supply of medications.   Adriana Reams, LCSW Clinical Social Work [phone]

## 2016-05-21 NOTE — Progress Notes (Signed)
[facility] Group Notes:  (Nursing/MHT/Case Management/Adjunct)  Date:  05/21/2016  Time:  1:11 PM  Type of Therapy:  Psychoeducational Skills  Participation Level:  Did Not Attend  Participation Quality:  Did not attend   Affect:  Did not attend   Cognitive:  Did not attend   Insight:  None  Engagement in Group:  did not attend   Modes of Intervention:  Activity  Summary of Progress/Problems:  [place] 05/21/2016, 1:11 PM

## 2016-05-21 NOTE — Progress Notes (Signed)
D: Patient has bright affect; pleasant mood.  He has a bed at [facility] that is available on Sunday.  Patient is apprehensive about going.  Patient inquired about leaving if he arrived at [place] [facility] and did not want to stay.  Informed patient that he could always leave, however, he probably would not be able to go back if he needed to.  Patient states, "I'll have to call my mother and see."  Patient denies any thoughts of self harm.  Patient appears to have poor insight regarding his substance abuse. A: Continue to monitor medication management and MD orders.  Safety checks completed every 15 minutes per protocol.  Offer support and encouragement as needed. R: Patient is receptive to staff; his behavior is appropriate.

## 2016-05-21 NOTE — Progress Notes (Signed)
D   Pt is pleasant on approach and cooperative with treatment    Pt making remarks about going to the rehab facility and wasn't sure how it was going to work    He said he would talk to his family and support people about [facility]  A    Verbal support given    Medications administered and effectiveness monitored   Encouraged further treatment for sobriety   Q 15 min checks R    Pt is safe at present time and somewhat receptive to verbal support and encouragement

## 2016-05-21 NOTE — BHH Group Notes (Signed)
Patient attend group. His day was a 1. His goal was to stay out bed. His goal was to function without so much medication to feel better.

## 2016-05-22 NOTE — H&P (Signed)
[name] [name] has spent much of his day laying in is bed. Quiet. Isolative to  himself. A HE took his meds as ordereed as and then he answered the  Questions on his daily assessment . He denied SI today and he rated his derpession, hopelessness anda xnweity " 5/7/8", respectively. He just smiles and knods his head when this writer speaks to him...  goes and gets back in his bed. Writer encouraged pt to get OOB and come to dayroom, unsuccessfully. R Safety in place. SW cont with DC planning.

## 2016-05-22 NOTE — Progress Notes (Signed)
[facility] MD Progress Note  05/22/2016 12:32 PM [name] [name]  MRN:  [id number] Subjective: Patient reports " I am extremely tired today. "  Objective: [name] [name] is awake, alert and oriented X4 seen resting in bed.  Denies suicidal or homicidal ideation. Denies auditory or visual hallucination and does not appear to be responding to internal stimuli. Patient interacts well with staff and others. Patient reports he is medication compliant without mediation side effects.  Report he hasn't attended any group session today, because he is to tired. States his depression 6/10.  Reports good appetite and resting well throughout the day. Patient report she is excited regarding discharge. Support, encouragement and reassurance was provided.     Principal Problem: MDD (major depressive disorder) Diagnosis:   Patient Active Problem List   Diagnosis Date Noted  . MDD (major depressive disorder) [F32.9] 05/18/2016  . Affective psychosis, bipolar ([place]) [F31.9] 04/30/2016  . Cocaine abuse with cocaine-induced mood disorder ([place]) [F14.14] 03/04/2016  . Alcohol dependence with uncomplicated withdrawal ([place]) [F10.230]   . Bipolar affective disorder, depressed, severe ([place]) [F31.4] 11/22/2015  . Alcohol use disorder, severe, dependence ([place]) [F10.20] 01/18/2015  . Alcohol dependence with alcohol-induced mood disorder ([place]) [F10.24]   . Suicidal ideation [R45.851]   . Acute pancreatitis [K85.90] 03/20/2012  . Polysubstance abuse [F19.10] 03/20/2012  . Cocaine abuse [F14.10] 01/12/2012  . Cannabis abuse [F12.10] 01/12/2012  . Alcohol dependence ([place]) [F10.20] 10/15/2011  . Abnormal ECG [R94.31]   . Chest pain [R07.89]   . CAD in native artery [I25.10]   . GERD (gastroesophageal reflux disease) [K21.9]   . Tobacco abuse [Z72.0]   . History of cocaine abuse [Z87.898]   . History of ETOH abuse [Z87.898]   . Bradycardia [R00.1]   . Syncope [R55]    Total Time spent with patient: 20 minutes   Past  Medical History:  Past Medical History:  Diagnosis Date  . Abnormal ECG    a. early repolarization  . Arthritis    "my whole left side" (05/03/2014)  . Bipolar disorder ([place])   . Bradycardia    a. asymptomatic  . CAD in native artery    a. Nonobstructive cath 11/2007;  b. Presented with ST elevation - Nonobstructive cath 08/2011  . Chest pain, mid sternal   . Coronary artery disease   . GERD (gastroesophageal reflux disease)   . History of cocaine abuse    a. quit ? 2009  . History of ETOH abuse    a. drinks 2 "40's" / wk  . Marijuana abuse    a. uses ~ 1x /wk or less  . Pneumonia   . Stomach ulcer   . Syncope    a. 12/2010 - presumed to be vasovagal  . Tobacco abuse     Past Surgical History:  Procedure Laterality Date  . CARDIAC CATHETERIZATION  2009; 08/2011   [name] 08/06/2011  . LEFT HEART CATHETERIZATION WITH CORONARY ANGIOGRAM N/A 08/16/2011   Procedure: LEFT HEART CATHETERIZATION WITH CORONARY ANGIOGRAM;  Surgeon: [name], MD;  Location: [facility] CATH LAB;  Service: Cardiovascular;  Laterality: N/A;   Family History: History reviewed. No pertinent family history.  Social History:  History  Alcohol Use  . 28.8 oz/week  . 48 Cans of beer per week    Comment: 2 40 oz beers per day     History  Drug Use  . Types: Marijuana, Cocaine    Social History   Social History  . Marital status: Single  Spouse name: N/A  . Number of children: N/A  . Years of education: N/A   Social History Main Topics  . Smoking status: Current Some Day Smoker    Packs/day: 0.50    Years: 20.00    Types: Cigarettes    Last attempt to quit: 06/24/2015  . Smokeless tobacco: Never Used  . Alcohol use 28.8 oz/week    48 Cans of beer per week     Comment: 2 40 oz beers per day  . Drug use:     Types: Marijuana, Cocaine  . Sexual activity: Yes   Other Topics Concern  . None   Social History Narrative   Lives with his daughter currently and receives SSI.    Additional  Social History:    Pain Medications: Pt denies abuse. Prescriptions: Pt denies abuse. Over the Counter: Pt denies abuse. History of alcohol / drug use?: Yes Longest period of sobriety (when/how long): Not Reported Negative Consequences of Use: Personal relationships Name of Substance 1: Alcohol 1 - Age of First Use: Not Reported 1 - Amount (size/oz): Pt reported, taking three shots of liquor and drinking beer.  1 - Frequency: when able to obtain it, typically weekly 1 - Duration: UTA 1 - Last Use / Amount: 12.11.17 Name of Substance 2: Marijuana 2 - Age of First Use: UTA 2 - Amount (size/oz): Pt reported smoking from 1000 to 1400 on Thanksgiving Day. Pt reported smoking one blunt.  2 - Frequency: Every other day 2 - Duration: UTA 2 - Last Use / Amount: 12.11.17/ S10 worth Name of Substance 3: Crack Cocaine 3 - Age of First Use: 14 3 - Amount (size/oz): 1-2 40-ounce beers 3 - Frequency: "anyime I could get it, basically once or twice a month" 3 - Last Use / Amount: "over one month ago"  Sleep: improved  Appetite:  improved  Current Medications: Current Facility-Administered Medications  Medication Dose Route Frequency Provider Last Rate Last Dose  . alum & mag hydroxide-simeth (MAALOX/MYLANTA) 200-200-20 MG/5ML suspension 30 mL  30 mL Oral Q4H PRN Kerrie Buffalo, NP      . feeding supplement (ENSURE ENLIVE) (ENSURE ENLIVE) liquid 237 mL  237 mL Oral BID BM Myer Peer Cobos, MD   237 mL at 05/21/16 1512  . LORazepam (ATIVAN) tablet 0.5 mg  0.5 mg Oral Q6H PRN Kerrie Buffalo, NP      . magnesium hydroxide (MILK OF MAGNESIA) suspension 30 mL  30 mL Oral Daily PRN Kerrie Buffalo, NP      . methocarbamol (ROBAXIN) tablet 500 mg  500 mg Oral Q8H PRN Kerrie Buffalo, NP   500 mg at 05/18/16 1709  . nicotine (NICODERM CQ - dosed in mg/24 hours) patch 21 mg  21 mg Transdermal Daily Kerrie Buffalo, NP      . QUEtiapine (SEROQUEL) tablet 100 mg  100 mg Oral QHS Kerrie Buffalo, NP   100 mg  at 05/21/16 2109  . sertraline (ZOLOFT) tablet 50 mg  50 mg Oral Daily Jenne Campus, MD   50 mg at 05/22/16 [phone]  . traZODone (DESYREL) tablet 50 mg  50 mg Oral QHS PRN Jenne Campus, MD   50 mg at 05/21/16 2109    Lab Results: No results found for this or any previous visit (from the past 35 hour(s)).  Blood Alcohol level:  Lab Results  Component Value Date   ETH 146 (H) 05/17/2016   ETH 222 (H) 0000000    Metabolic Disorder Labs: Lab Results  Component Value Date   HGBA1C 4.9 05/04/2016   MPG 94 05/04/2016   MPG 82 05/03/2014   No results found for: PROLACTIN Lab Results  Component Value Date   CHOL 170 05/04/2016   TRIG 155 (H) 05/04/2016   HDL 65 05/04/2016   CHOLHDL 2.6 05/04/2016   VLDL 31 05/04/2016   LDLCALC 74 05/04/2016   LDLCALC 42 05/03/2014    Physical Findings: AIMS: Facial and Oral Movements Muscles of Facial Expression: None, normal Lips and Perioral Area: None, normal Jaw: None, normal Tongue: None, normal,Extremity Movements Upper (arms, wrists, hands, fingers): None, normal Lower (legs, knees, ankles, toes): None, normal, Trunk Movements Neck, shoulders, hips: None, normal, Overall Severity Severity of abnormal movements (highest score from questions above): None, normal Incapacitation due to abnormal movements: None, normal Patient's awareness of abnormal movements (rate only patient's report): No Awareness, Dental Status Current problems with teeth and/or dentures?: Yes Does patient usually wear dentures?: No  CIWA:  CIWA-Ar Total: 4 COWS:     Musculoskeletal: Strength & Muscle Tone: within normal limits Gait & Station: normal Patient leans: N/A  Psychiatric Specialty Exam: Physical Exam  Nursing note and vitals reviewed. Constitutional: He is oriented to person, place, and time. He appears well-developed.  Cardiovascular: Normal rate.   Neurological: He is alert and oriented to person, place, and time.  Psychiatric: He has a  normal mood and affect. His behavior is normal.    Review of Systems  Psychiatric/Behavioral: Positive for depression and substance abuse. Negative for suicidal ideas. The patient is nervous/anxious.    denies chest pain, no shortness of breath, no vomiting   Blood pressure 116/61, pulse (!) 53, temperature 97.6 F (36.4 C), resp. rate 18, height 5\' 4"  (1.626 m), weight 51.9 kg (114 lb 8 oz), SpO2 100 %.Body mass index is 19.65 kg/m.  General Appearance: Casual  Eye Contact:  improving   Speech:  Normal Rate  Volume:  Normal  Mood: gradually improving mood   Affect:  Appropriate, reactive   Thought Process:  Linear  Orientation:  Full (Time, Place, and Person)  Thought Content:  denies hallucinations, no delusions, not internally preoccupied   Suicidal Thoughts:  No   Homicidal Thoughts:  No  Memory:  recent and remote grossly intact   Judgement:  Other:  improving  Insight:  improving  Psychomotor Activity:  Restlessness  Concentration:  Concentration: Good and Attention Span: Good  Recall:  Good  Fund of Knowledge:  Good  Language:  Good  Akathisia:  Negative  Handed:  Right  AIMS (if indicated):     Assets:  Communication Skills Desire for Improvement Resilience  ADL's:  Intact  Cognition:  WNL  Sleep:  Number of Hours: 6.75     I agree with current treatment plan on 05/22/2016, Patient seen face-to-face for psychiatric evaluation follow-up, chart reviewed. Reviewed the information documented and agree with the treatment plan.  Treatment Plan Summary:  Daily contact with patient to assess and evaluate symptoms and progress in treatment, Medication management, Plan inpatient treatment  and medications as below Encourage group and milieu participation to work on coping skills and symptom reduction Treatment team working on disposition planning- as noted, patient wants to go to a residential rehab program Continue  Seroquel 50 mgrs QHS for mood disorder,  insomnia Continue Trazodone 50 mgrs QHS PRN for insomnia as needed  Continue Ativan 0.5 mgrs Q 6 hours PRN for severe anxiety, as needed  Continue Zoloft  50 mgrs QDAY for depression.  Derrill Center, NP 05/22/2016, 12:32 PM

## 2016-05-22 NOTE — BHH Group Notes (Signed)
[facility] Group Notes: (Clinical Social Work)   05/22/2016      Type of Therapy:  Group Therapy   Participation Level:  Did Not Attend despite MHT prompting   Selmer Dominion, LCSW 05/22/2016, 1:38 PM

## 2016-05-22 NOTE — BHH Group Notes (Signed)
[facility] Group Notes:  (Nursing/MHT/Case Management/Adjunct)  Date:  05/22/2016  Time:  2:42 PM  Type of Therapy:  Psychoeducational Skills  Participation Level:  Did Not Attend  Participation Quality:  Did Not Attend  Affect:  Did Not Attend  Cognitive:  Did Not Attend  Insight:  None  Engagement in Group:  Did Not Attend  Modes of Intervention:  Did Not Attend  Summary of Progress/Problems: Pt did not attend patient self inventory group.   Benancio Deeds Shanta 05/22/2016, 2:42 PM

## 2016-05-22 NOTE — Progress Notes (Signed)
D   Pt is pleasant on approach and cooperative with treatment    Pt making remarks about going to the rehab facility and wasn't sure how it was going to work out for him  He also isolated more this evening and went to bed early refusing his sleep medication A    Verbal support given    Medications administered and effectiveness monitored   Encouraged further treatment for sobriety   Q 15 min checks R    Pt is safe at present time and somewhat receptive to verbal support and encouragement

## 2016-05-22 NOTE — BHH Group Notes (Signed)
Patient attend group. His day was 10. His goal was to make meetings stay out bed. He needs to feel better about self and push forward.

## 2016-05-23 DIAGNOSIS — F17210 Nicotine dependence, cigarettes, uncomplicated: Secondary | ICD-10-CM

## 2016-05-23 DIAGNOSIS — F1020 Alcohol dependence, uncomplicated: Secondary | ICD-10-CM | POA: Clinically undetermined

## 2016-05-23 DIAGNOSIS — F4321 Adjustment disorder with depressed mood: Secondary | ICD-10-CM

## 2016-05-23 DIAGNOSIS — F4310 Post-traumatic stress disorder, unspecified: Secondary | ICD-10-CM | POA: Diagnosis present

## 2016-05-23 DIAGNOSIS — F332 Major depressive disorder, recurrent severe without psychotic features: Secondary | ICD-10-CM | POA: Diagnosis present

## 2016-05-23 DIAGNOSIS — Z91018 Allergy to other foods: Secondary | ICD-10-CM

## 2016-05-23 DIAGNOSIS — F1421 Cocaine dependence, in remission: Secondary | ICD-10-CM

## 2016-05-23 MED ORDER — SERTRALINE HCL 50 MG PO TABS: 50.0000 mg | ORAL_TABLET | Freq: Every day | ORAL | 0 refills | Status: DC

## 2016-05-23 MED ORDER — TRAZODONE HCL 50 MG PO TABS: 50.0000 mg | ORAL_TABLET | Freq: Every evening | ORAL | 0 refills | Status: DC | PRN

## 2016-05-23 MED ORDER — NICOTINE 21 MG/24HR TD PT24: 21.0000 mg | MEDICATED_PATCH | Freq: Every day | TRANSDERMAL | 0 refills | Status: DC

## 2016-05-23 NOTE — BHH Suicide Risk Assessment (Signed)
[facility] INPATIENT:  Family/Significant Other Suicide Prevention Education  Suicide Prevention Education:  Contact Attempts: parents, Benjamine Mola and Cristhofer Brigham [phone]) has been identified by the patient as the family member/significant other with whom the patient will be residing, and identified as the person(s) who will aid the patient in the event of a mental health crisis.  With written consent from the patient, two attempts were made to provide suicide prevention education, prior to and/or following the patient's discharge.  We were unsuccessful in providing suicide prevention education.  A suicide education pamphlet was given to the patient to share with family/significant other.  Date and time of first attempt:05/21/16/3:15pm Date and time of second attempt:05/23/16/1:15pm - father was reached by phone, could not talk and asked that a brochure be sent to him instead.  This was provided with pt's discharge paperwork.  Berlin Hun Grossman-Orr 05/23/2016, 1:17 PM

## 2016-05-23 NOTE — Progress Notes (Signed)
  [facility] Adult Case Management Discharge Plan :  Will you be returning to the same living situation after discharge:  Yes,  with family At discharge, do you have transportation home?: Yes,  father Do you have the ability to pay for your medications: Yes,  insurance and disability check  Release of information consent forms completed and in the chart;  Patient's signature needed at discharge.  Patient to Follow up at: Follow-up Information    [facility]. Go on 05/25/2016.   Specialty:  Behavioral Health Why:  Please go to the [facility] within 7 days of discharge.  It is open Monday-Friday 8am-3pm to have an intake, assessment, and be restarted in medication management and therapy.   Contact information: [place] [address] [phone]           Next level of care provider has access to [facility] and Suicide Prevention discussed: Yes,  with patient, brochure sent to father at father's request  Have you used any form of tobacco in the last 30 days? (Cigarettes, Smokeless Tobacco, Cigars, and/or Pipes): Yes  Has patient been referred to the Quitline?: Patient refused referral  Patient has been referred for addiction treatment: Pt. refused referral  Maretta Los 05/23/2016, 1:20 PM

## 2016-05-23 NOTE — BHH Counselor (Signed)
Clinical Social Work Note  Talked to pt about his decision shared with nurse that he no longer wanted to go to [facility] for treatment.  He was unwilling to change his mind.  He stated his daughter now understands the depth of his depression and will be more of a support to him.  Stated he had some trepidation about how to stay sober, but feels he can handle it on an outpatient basis.  Called [facility] to let them know that patient changed his mind about going to treatment.  Transportation person still showed up, met in lobby and told that individual [facility] had been informed of the cancellation already.  Selmer Dominion, LCSW 05/23/2016, 1:21 PM
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
## 2016-05-23 NOTE — Progress Notes (Signed)
[name] [name] is prepared for discharge as this Probation officer assissted him in completing this morning daily assessment. He answered questions as  Follows" he denied SI today and he rated his depression, hopelessness and anxiety " 0/0/0", resepctively. He ( at the last minute) decided NOT to go to [facility] , instead he is going to stay with his parents in their home. His DC paperwork is reviewed with him by this writer ( AVS, MD SRA, pt -created suicide safety plan and AVS) . These cc are given to him, along with prescriptions for his medications and he is instructed to go to [facility] within the next 7 days for his follow up. He is appreciative of care given him, all belongings in his locker are returned to him and he is escorted to bldg entrance and dc'[name] per MD order.

## 2016-05-23 NOTE — BHH Suicide Risk Assessment (Signed)
[facility] Discharge Suicide Risk Assessment   Principal Problem: Major depressive disorder, recurrent episode, severe with anxious distress [facility]) Discharge Diagnoses:  Patient Active Problem List   Diagnosis Date Noted  . Major depressive disorder, recurrent episode, severe with anxious distress ([place]) [F33.2] 05/23/2016  . PTSD (post-traumatic stress disorder) [F43.10] 05/23/2016  . Cocaine use disorder, moderate, in early remission ([place]) [F14.21] 05/23/2016  . Alcohol use disorder, moderate, dependence ([place]) [F10.20] 05/23/2016  . Affective psychosis, bipolar ([place]) [F31.9] 04/30/2016  . Cocaine abuse with cocaine-induced mood disorder ([place]) [F14.14] 03/04/2016  . Alcohol dependence with uncomplicated withdrawal ([place]) [F10.230]   . Bipolar affective disorder, depressed, severe ([place]) [F31.4] 11/22/2015  . Alcohol use disorder, severe, dependence ([place]) [F10.20] 01/18/2015  . Alcohol dependence with alcohol-induced mood disorder ([place]) [F10.24]   . Suicidal ideation [R45.851]   . Acute pancreatitis [K85.90] 03/20/2012  . Polysubstance abuse [F19.10] 03/20/2012  . Cocaine abuse [F14.10] 01/12/2012  . Cannabis abuse [F12.10] 01/12/2012  . Alcohol dependence ([place]) [F10.20] 10/15/2011  . Abnormal ECG [R94.31]   . Chest pain [R07.89]   . CAD in native artery [I25.10]   . GERD (gastroesophageal reflux disease) [K21.9]   . Tobacco abuse [Z72.0]   . History of cocaine abuse [Z87.898]   . History of ETOH abuse [Z87.898]   . Bradycardia [R00.1]   . Syncope [R55]     Total Time spent with patient: 30 minutes  Musculoskeletal: Strength & Muscle Tone: within normal limits Gait & Station: normal Patient leans: N/A  Psychiatric Specialty Exam: Review of Systems  Psychiatric/Behavioral: Positive for substance abuse. Negative for depression.  All other systems reviewed and are negative.   Blood pressure 116/61, pulse (!) 53, temperature 97.6 F (36.4 C), resp. rate 18, height 5\' 4"  (1.626 m),  weight 51.9 kg (114 lb 8 oz), SpO2 100 %.Body mass index is 19.65 kg/m.  General Appearance: Casual  Eye Contact::  Fair  Speech:  Clear and Coherent409  Volume:  Normal  Mood:  Euthymic  Affect:  Appropriate  Thought Process:  Goal Directed and Descriptions of Associations: Intact  Orientation:  Full (Time, Place, and Person)  Thought Content:  Logical  Suicidal Thoughts:  No  Homicidal Thoughts:  No  Memory:  Immediate;   Fair Recent;   Fair Remote;   Fair  Judgement:  Fair  Insight:  Fair  Psychomotor Activity:  Normal  Concentration:  Fair  Recall:  [employer] of Knowledge:Fair  Language: Fair  Akathisia:  No  Handed:  Right  AIMS (if indicated):     Assets:  Desire for Improvement  Sleep:  Number of Hours: 6.75  Cognition: WNL  ADL's:  Intact   Mental Status Per Nursing Assessment::   On Admission:  Suicidal ideation indicated by patient  Demographic Factors:  Male  Loss Factors: NA  Historical Factors: Impulsivity  Risk Reduction Factors:   Positive social support  Continued Clinical Symptoms:  Alcohol/Substance Abuse/Dependencies Previous Psychiatric Diagnoses and Treatments  Cognitive Features That Contribute To Risk:  None    Suicide Risk:  Minimal: No identifiable suicidal ideation.  Patients presenting with no risk factors but with morbid ruminations; may be classified as minimal risk based on the severity of the depressive symptoms    Plan Of Care/Follow-up recommendations:  Activity:  NO RESTRICTIONS Diet:  REGULAR Tests:  AS NEEDED Other:  PLEASE FOLLOW UP WITH AFTERCARE  Kayona Foor, MD 05/23/2016, 9:25 AM

## 2016-05-23 NOTE — BHH Group Notes (Signed)
Adult Therapy Group Note  Date: 05/23/2016  Time:  9:00-10:00AM  Group Topic/Focus: Healthy Engineer, manufacturing Esteem:   The focus of this group was to assist patients in identifying their current healthy supports and unhealthy supports, then discussing how to add additional healthy supports and reduce existing unhealthy ones. The healthy additions included supports such as 12-step groups, individual therapy, psychiatrists, faith activities,  sponsors, group therapy, support groups, classes on mental health, and more.  A portion of group was devoted to talking about the helpfulness of learning one's diagnosis, educating oneself on that diagnosis, and sharing with others in one's life so that they also can come to understand the symptoms and perhaps be more accepting.  Participation Level:  Active  Participation Quality:  Attentive and Sharing  Affect:  Blunted  Cognitive:  Appropriate  Insight: Fair  Engagement in Group:  Engaged  Modes of Intervention:  Discussion and Support  Additional Comments:  The patient expressed that a current healthy support is his daughter, whom he spoke to this morning and is going to help him stay on the medications while an unhealthy support is "none" currently.  He has changed his mind about his discharge plan, has decided not to go to treatment at [facility], feels that his daughter is now more understanding of just how deep his depression is and will be more helpful.  Maretta Los, LCSW 05/23/2016  12:52 PM

## 2016-08-27 ENCOUNTER — Emergency Department (HOSPITAL_COMMUNITY)
Admission: EM | Admit: 2016-08-27 | Discharge: 2016-08-28 | Disposition: A | Discharge status: Home / Self Care | Payer: Medicaid Other | Attending: Emergency Medicine | Admitting: Emergency Medicine

## 2016-08-27 ENCOUNTER — Emergency Department (HOSPITAL_COMMUNITY): Payer: Medicaid Other

## 2016-08-27 ENCOUNTER — Encounter (HOSPITAL_COMMUNITY): Payer: Self-pay | Admitting: Emergency Medicine

## 2016-08-27 DIAGNOSIS — F17210 Nicotine dependence, cigarettes, uncomplicated: Secondary | ICD-10-CM | POA: Insufficient documentation

## 2016-08-27 DIAGNOSIS — R079 Chest pain, unspecified: Secondary | ICD-10-CM | POA: Diagnosis present

## 2016-08-27 DIAGNOSIS — K860 Alcohol-induced chronic pancreatitis: Secondary | ICD-10-CM | POA: Diagnosis not present

## 2016-08-27 DIAGNOSIS — Z79899 Other long term (current) drug therapy: Secondary | ICD-10-CM | POA: Insufficient documentation

## 2016-08-27 DIAGNOSIS — I2510 Atherosclerotic heart disease of native coronary artery without angina pectoris: Secondary | ICD-10-CM | POA: Diagnosis not present

## 2016-08-27 LAB — COMPREHENSIVE METABOLIC PANEL
ALT: 14 U/L — AB (ref 17–63)
AST: 21 U/L (ref 15–41)
Albumin: 3.3 g/dL — ABNORMAL LOW (ref 3.5–5.0)
Alkaline Phosphatase: 63 U/L (ref 38–126)
Anion gap: 8 (ref 5–15)
BUN: 7 mg/dL (ref 6–20)
CHLORIDE: 102 mmol/L (ref 101–111)
CO2: 25 mmol/L (ref 22–32)
CREATININE: 1.05 mg/dL (ref 0.61–1.24)
Calcium: 9.2 mg/dL (ref 8.9–10.3)
Glucose, Bld: 92 mg/dL (ref 65–99)
POTASSIUM: 3.4 mmol/L — AB (ref 3.5–5.1)
Sodium: 135 mmol/L (ref 135–145)
Total Bilirubin: 0.7 mg/dL (ref 0.3–1.2)
Total Protein: 6.6 g/dL (ref 6.5–8.1)

## 2016-08-27 LAB — I-STAT TROPONIN, ED: Troponin i, poc: 0 ng/mL (ref 0.00–0.08)

## 2016-08-27 LAB — CBC
HCT: 38.6 % — ABNORMAL LOW (ref 39.0–52.0)
Hemoglobin: 13.4 g/dL (ref 13.0–17.0)
MCH: 31 pg (ref 26.0–34.0)
MCHC: 34.7 g/dL (ref 30.0–36.0)
MCV: 89.4 fL (ref 78.0–100.0)
PLATELETS: 247 10*3/uL (ref 150–400)
RBC: 4.32 MIL/uL (ref 4.22–5.81)
RDW: 13.1 % (ref 11.5–15.5)
WBC: 6 10*3/uL (ref 4.0–10.5)

## 2016-08-27 LAB — LIPASE, BLOOD: LIPASE: 24 U/L (ref 11–51)

## 2016-08-27 MED ORDER — SODIUM CHLORIDE 0.9 % IV SOLN
Freq: Once | INTRAVENOUS | Status: AC
  Administered 2016-08-27: via INTRAVENOUS

## 2016-08-27 MED ORDER — ONDANSETRON HCL 4 MG/2ML IJ SOLN
4.0000 mg | Freq: Once | INTRAMUSCULAR | Status: AC
Start: 2016-08-27 — End: 2016-08-27
  Administered 2016-08-27: 4 mg via INTRAVENOUS
  Filled 2016-08-27: qty 2

## 2016-08-27 MED ORDER — ONDANSETRON HCL 4 MG/2ML IJ SOLN
4.0000 mg | Freq: Once | INTRAMUSCULAR | Status: AC
  Administered 2016-08-27: 4 mg via INTRAVENOUS
  Filled 2016-08-27: qty 2

## 2016-08-27 MED ORDER — MORPHINE SULFATE (PF) 4 MG/ML IV SOLN
4.0000 mg | Freq: Once | INTRAVENOUS | Status: AC
  Administered 2016-08-27: 4 mg via INTRAVENOUS
  Filled 2016-08-27: qty 1

## 2016-08-27 MED ORDER — SODIUM CHLORIDE 0.9 % IV BOLUS (SEPSIS)
500.0000 mL | Freq: Once | INTRAVENOUS | Status: AC
Start: 2016-08-27 — End: 2016-08-28
  Administered 2016-08-27: 500 mL via INTRAVENOUS

## 2016-08-27 NOTE — ED Provider Notes (Signed)
[facility] DEPT Provider Note   CSN: [id number] Arrival date & time: 08/27/16  2151     History   Chief Complaint Chief Complaint  Patient presents with  . Chest Pain    HPI [name] [name] is a 51 y.o. male.  This is a 51 year old male with a history of pancreatitis who presents with epigastric pain that became worse tonight when he drank a beer.  He has nausea but no vomiting.      Past Medical History:  Diagnosis Date  . Abnormal ECG    a. early repolarization  . Arthritis    "my whole left side" (05/03/2014)  . Bipolar disorder ([place])   . Bradycardia    a. asymptomatic  . CAD in native artery    a. Nonobstructive cath 11/2007;  b. Presented with ST elevation - Nonobstructive cath 08/2011  . Chest pain, mid sternal   . Coronary artery disease   . GERD (gastroesophageal reflux disease)   . History of cocaine abuse    a. quit ? 2009  . History of ETOH abuse    a. drinks 2 "40's" / wk  . Marijuana abuse    a. uses ~ 1x /wk or less  . Pneumonia   . Stomach ulcer   . Syncope    a. 12/2010 - presumed to be vasovagal  . Tobacco abuse     Patient Active Problem List   Diagnosis Date Noted  . Major depressive disorder, recurrent episode, severe with anxious distress ([place]) 05/23/2016  . PTSD (post-traumatic stress disorder) 05/23/2016  . Cocaine use disorder, moderate, in early remission ([place]) 05/23/2016  . Alcohol use disorder, moderate, dependence ([place]) 05/23/2016  . Affective psychosis, bipolar ([place]) 04/30/2016  . Cocaine abuse with cocaine-induced mood disorder ([place]) 03/04/2016  . Alcohol dependence with uncomplicated withdrawal ([place])   . Bipolar affective disorder, depressed, severe ([place]) 11/22/2015  . Alcohol use disorder, severe, dependence ([place]) 01/18/2015  . Alcohol dependence with alcohol-induced mood disorder ([place])   . Suicidal ideation   . Acute pancreatitis 03/20/2012  . Polysubstance abuse 03/20/2012  . Cocaine abuse 01/12/2012  . Cannabis  abuse 01/12/2012  . Alcohol dependence ([place]) 10/15/2011  . Abnormal ECG   . Chest pain   . CAD in native artery   . GERD (gastroesophageal reflux disease)   . Tobacco abuse   . History of cocaine abuse   . History of ETOH abuse   . Bradycardia   . Syncope     Past Surgical History:  Procedure Laterality Date  . CARDIAC CATHETERIZATION  2009; 08/2011   [name] 08/06/2011  . LEFT HEART CATHETERIZATION WITH CORONARY ANGIOGRAM N/A 08/16/2011   Procedure: LEFT HEART CATHETERIZATION WITH CORONARY ANGIOGRAM;  Surgeon: [name], MD;  Location[facility] LAB;  Service: Cardiovascular;  Laterality: N/A;       Home Medications    Prior to Admission medications   Medication Sig Start Date End Date Taking? Authorizing Provider  nicotine (NICODERM CQ - DOSED IN MG/24 HOURS) 21 mg/24hr patch Place 1 patch (21 mg total) onto the skin daily. Patient not taking: Reported on 08/28/2016 05/23/16   Derrill Center, NP  ondansetron (ZOFRAN ODT) 4 MG disintegrating tablet Take 1 tablet (4 mg total) by mouth every 8 (eight) hours as needed for nausea or vomiting. 08/28/16   Junius Creamer, NP  oxyCODONE (ROXICODONE) 5 MG immediate release tablet Take 1 tablet (5 mg total) by mouth every 4 (four) hours as needed for severe pain.  08/28/16   Junius Creamer, NP  sertraline (ZOLOFT) 50 MG tablet Take 1 tablet (50 mg total) by mouth daily. Patient not taking: Reported on 08/28/2016 05/23/16   Derrill Center, NP  traZODone (DESYREL) 50 MG tablet Take 1 tablet (50 mg total) by mouth at bedtime as needed for sleep. Patient not taking: Reported on 08/28/2016 05/23/16   Derrill Center, NP    Family History No family history on file.  Social History Social History  Substance Use Topics  . Smoking status: Current Some Day Smoker    Packs/day: 0.50    Years: 20.00    Types: Cigarettes    Last attempt to quit: 06/24/2015  . Smokeless tobacco: Never Used  . Alcohol use 28.8 oz/week    48 Cans of beer per week      Comment: 2 40 oz beers per day     Allergies   Aspirin; Pepperoni [pickled meat]; Tomato; and Tylenol [acetaminophen]   Review of Systems Review of Systems  Respiratory: Negative for shortness of breath.   Cardiovascular: Positive for chest pain.  Gastrointestinal: Positive for abdominal distention and nausea. Negative for vomiting.     Physical Exam Updated Vital Signs BP 102/64   Pulse (!) 43   Resp 15   Ht 5\' 5"  (1.651 m)   Wt 51.3 kg   SpO2 98%   BMI 18.80 kg/m   Physical Exam  Constitutional: He appears well-developed and well-nourished.  HENT:  Head: Normocephalic.  Eyes: Pupils are equal, round, and reactive to light.  Neck: Normal range of motion.  Cardiovascular: Normal rate.   Pulmonary/Chest: Effort normal.  Abdominal: He exhibits no distension. There is tenderness in the epigastric area and left upper quadrant.    Nursing note and vitals reviewed.    ED Treatments / Results  Labs (all labs ordered are listed, but only abnormal results are displayed) Labs Reviewed  CBC - Abnormal; Notable for the following:       Result Value   HCT 38.6 (*)    All other components within normal limits  COMPREHENSIVE METABOLIC PANEL - Abnormal; Notable for the following:    Potassium 3.4 (*)    Albumin 3.3 (*)    ALT 14 (*)    All other components within normal limits  LIPASE, BLOOD  I-STAT TROPOININ, ED    EKG  EKG Interpretation  Date/Time:  Friday August 27 2016 21:52:29 EDT Ventricular Rate:  55 PR Interval:    QRS Duration: 89 QT Interval:  388 QTC Calculation: 371 R Axis:   23 Text Interpretation:  Sinus rhythm RSR' in V1 or V2, probably normal variant ST elev, probable normal early repol pattern No significant change since last tracing Confirmed by [name]  MD, MARTHA [phone]) on 08/27/2016 10:20:24 PM       Radiology Dg Chest 2 View  Result Date: 08/27/2016 CLINICAL DATA:  51 year old male with chest pain EXAM: CHEST  2 VIEW COMPARISON:  Chest  radiograph dated 05/10/2016 FINDINGS: The heart size and mediastinal contours are within normal limits. Both lungs are clear. The visualized skeletal structures are unremarkable. IMPRESSION: No active cardiopulmonary disease. Electronically Signed   By: Anner Crete M.D.   On: 08/27/2016 23:04    Procedures Procedures (including critical care time)  Medications Ordered in ED Medications  sodium chloride 0.9 % bolus 500 mL (0 mLs Intravenous Stopped 08/28/16 0053)  morphine 4 MG/ML injection 4 mg (4 mg Intravenous Given 08/27/16 2306)  ondansetron (ZOFRAN) injection 4  mg (4 mg Intravenous Given 08/27/16 2306)  0.9 %  sodium chloride infusion ( Intravenous Bolus from Bag 08/27/16 2330)  ondansetron (ZOFRAN) injection 4 mg (4 mg Intravenous Given 08/27/16 2344)  morphine 4 MG/ML injection 4 mg (4 mg Intravenous Given 08/28/16 0051)  oxyCODONE (ROXICODONE) 5 MG/5ML solution 5 mg (5 mg Oral Given 08/28/16 0208)     Initial Impression / Assessment and Plan / ED Course  I have reviewed the triage vital signs and the nursing notes.  Pertinent labs & imaging results that were available during my care of the patient were reviewed by me and considered in my medical decision making (see chart for details).      Patient has been switched to by mouth   Roxicodone.  Zofran.  He is tolerating fluids and will be discharged home.  Office follow-up  Final Clinical Impressions(s) / ED Diagnoses   Final diagnoses:  Alcohol-induced chronic pancreatitis (HCC)    New Prescriptions New Prescriptions   ONDANSETRON (ZOFRAN ODT) 4 MG DISINTEGRATING TABLET    Take 1 tablet (4 mg total) by mouth every 8 (eight) hours as needed for nausea or vomiting.   OXYCODONE (ROXICODONE) 5 MG IMMEDIATE RELEASE TABLET    Take 1 tablet (5 mg total) by mouth every 4 (four) hours as needed for severe pain.     Junius Creamer, NP 08/28/16 0210    Alfonzo Beers, MD 08/28/16 [phone]

## 2016-08-27 NOTE — ED Triage Notes (Signed)
Brought by ems from home with c/o epigastric pain that started 4 days ago.  Reports pain has gradually gotten worse.  Rating at 8/10 at this time.  Also reporting nausea worse in the morning but no vomiting.  Drank 1 beer an hour pta.  States pain comes and goes and went away before drinking beer.  Came back while drinking it.

## 2016-08-27 NOTE — ED Notes (Signed)
Patient transported to X-ray 

## 2016-08-28 MED ORDER — OXYCODONE HCL 5 MG/5ML PO SOLN
5.0000 mg | ORAL | Status: AC
  Administered 2016-08-28: 5 mg via ORAL
  Filled 2016-08-28: qty 5

## 2016-08-28 MED ORDER — ONDANSETRON 4 MG PO TBDP: 4.0000 mg | ORAL_TABLET | Freq: Three times a day (TID) | ORAL | 0 refills | Status: DC | PRN

## 2016-08-28 MED ORDER — OXYCODONE HCL 5 MG PO TABS: 5.0000 mg | ORAL_TABLET | ORAL | 0 refills | Status: DC | PRN

## 2016-08-28 MED ORDER — MORPHINE SULFATE (PF) 4 MG/ML IV SOLN
4.0000 mg | Freq: Once | INTRAVENOUS | Status: AC
  Administered 2016-08-28: 4 mg via INTRAVENOUS
  Filled 2016-08-28: qty 1

## 2016-08-28 NOTE — Discharge Instructions (Signed)
Try to follow a clear liquid diet for the next 24 hours and gradually add in solid foods

## 2016-09-21 ENCOUNTER — Emergency Department (HOSPITAL_COMMUNITY)
Admission: EM | Admit: 2016-09-21 | Discharge: 2016-09-21 | Disposition: A | Discharge status: Home / Self Care | Payer: Medicaid Other | Attending: Emergency Medicine | Admitting: Emergency Medicine

## 2016-09-21 ENCOUNTER — Encounter (HOSPITAL_COMMUNITY): Payer: Self-pay | Admitting: Emergency Medicine

## 2016-09-21 ENCOUNTER — Emergency Department (HOSPITAL_COMMUNITY): Payer: Medicaid Other

## 2016-09-21 DIAGNOSIS — F17210 Nicotine dependence, cigarettes, uncomplicated: Secondary | ICD-10-CM | POA: Diagnosis not present

## 2016-09-21 DIAGNOSIS — I2510 Atherosclerotic heart disease of native coronary artery without angina pectoris: Secondary | ICD-10-CM | POA: Diagnosis not present

## 2016-09-21 DIAGNOSIS — K219 Gastro-esophageal reflux disease without esophagitis: Secondary | ICD-10-CM | POA: Diagnosis not present

## 2016-09-21 DIAGNOSIS — R079 Chest pain, unspecified: Secondary | ICD-10-CM

## 2016-09-21 DIAGNOSIS — R1013 Epigastric pain: Secondary | ICD-10-CM | POA: Diagnosis present

## 2016-09-21 LAB — HEPATIC FUNCTION PANEL
ALK PHOS: 64 U/L (ref 38–126)
ALT: 15 U/L — AB (ref 17–63)
AST: 25 U/L (ref 15–41)
Albumin: 3.9 g/dL (ref 3.5–5.0)
BILIRUBIN DIRECT: 0.1 mg/dL (ref 0.1–0.5)
Indirect Bilirubin: 0.4 mg/dL (ref 0.3–0.9)
Total Bilirubin: 0.5 mg/dL (ref 0.3–1.2)
Total Protein: 6.9 g/dL (ref 6.5–8.1)

## 2016-09-21 LAB — CBC
HEMATOCRIT: 40.3 % (ref 39.0–52.0)
HEMOGLOBIN: 14 g/dL (ref 13.0–17.0)
MCH: 31 pg (ref 26.0–34.0)
MCHC: 34.7 g/dL (ref 30.0–36.0)
MCV: 89.2 fL (ref 78.0–100.0)
Platelets: 203 10*3/uL (ref 150–400)
RBC: 4.52 MIL/uL (ref 4.22–5.81)
RDW: 13.2 % (ref 11.5–15.5)
WBC: 5.4 10*3/uL (ref 4.0–10.5)

## 2016-09-21 LAB — BASIC METABOLIC PANEL
ANION GAP: 7 (ref 5–15)
BUN: 11 mg/dL (ref 6–20)
CHLORIDE: 105 mmol/L (ref 101–111)
CO2: 24 mmol/L (ref 22–32)
Calcium: 9 mg/dL (ref 8.9–10.3)
Creatinine, Ser: 1.2 mg/dL (ref 0.61–1.24)
GFR calc Af Amer: >60 mL/min (ref >60)
GFR calc non Af Amer: >60 mL/min (ref >60)
GLUCOSE: 84 mg/dL (ref 65–99)
POTASSIUM: 3.8 mmol/L (ref 3.5–5.1)
Sodium: 136 mmol/L (ref 135–145)

## 2016-09-21 LAB — I-STAT TROPONIN, ED: Troponin i, poc: 0 ng/mL (ref 0.00–0.08)

## 2016-09-21 LAB — LIPASE, BLOOD: Lipase: 46 U/L (ref 11–51)

## 2016-09-21 MED ORDER — OMEPRAZOLE 20 MG PO CPDR: 20.0000 mg | DELAYED_RELEASE_CAPSULE | Freq: Every day | ORAL | 0 refills | Status: DC

## 2016-09-21 MED ORDER — GI COCKTAIL ~~LOC~~
30.0000 mL | Freq: Once | ORAL | Status: AC
  Administered 2016-09-21: 30 mL via ORAL
  Filled 2016-09-21: qty 30

## 2016-09-21 MED ORDER — SUCRALFATE 1 G PO TABS: 1.0000 g | ORAL_TABLET | Freq: Three times a day (TID) | ORAL | 0 refills | Status: DC

## 2016-09-21 MED ORDER — ACETAMINOPHEN 325 MG PO TABS
650.0000 mg | ORAL_TABLET | Freq: Once | ORAL | Status: AC
  Administered 2016-09-21: 650 mg via ORAL
  Filled 2016-09-21: qty 2

## 2016-09-21 MED ORDER — SUCRALFATE 1 G PO TABS
1.0000 g | ORAL_TABLET | Freq: Once | ORAL | Status: AC
  Administered 2016-09-21: 1 g via ORAL
  Filled 2016-09-21: qty 1

## 2016-09-21 MED ORDER — FAMOTIDINE 20 MG PO TABS
20.0000 mg | ORAL_TABLET | Freq: Once | ORAL | Status: AC
  Administered 2016-09-21: 20 mg via ORAL
  Filled 2016-09-21: qty 1

## 2016-09-21 NOTE — ED Notes (Signed)
2 knife returned to pt on the waiting room at dc time.

## 2016-09-21 NOTE — Discharge Instructions (Signed)
Take the prescribed medication as directed.  Try to avoid NSAID's (aleve, motrin, naprosyn, etc) as well as spicy foods Follow-up with your primary care doctor.  Would also recommend that you follow-up with a GI doctor. Return to the ED for new or worsening symptoms.

## 2016-09-21 NOTE — ED Provider Notes (Signed)
[facility] DEPT Provider Note   CSN: [id number] Arrival date & time: 09/21/16  0036     History   Chief Complaint Chief Complaint  Patient presents with  . Chest Pain    HPI [name] [name] is a 51 y.o. male.  The history is provided by the patient and medical records.  Chest Pain   Associated symptoms include nausea.   51 y.o. M with hx of arthritis, bipolar disorder, CAD with non-obstructive disease, GERD, history of alcohol abuse, presenting to the ED for chest pain.  Patient reports this started 2 days ago but he feels like it is "coming from my stomach".  He states he has epigastric pain radiating up into his chest.  States pain is a burning sensation.  He has had some nausea but denies vomiting.  He denies sweats, chills, shortness of breath, dizziness, weakness, numbness, or syncope.  Does have hx of CAD, not followed by cardiology at present.  He is a smoker.  Denies recent drug or alcohol use.  Patient does admit to having these "episodes" every few weeks or so.  States he has been told he has bad GERD and possibly an ulcer.  States he has been trying to avoid spicy foods but has been taking naprosyn.  Has not seen GI about these issues.  Past Medical History:  Diagnosis Date  . Abnormal ECG    a. early repolarization  . Arthritis    "my whole left side" (05/03/2014)  . Bipolar disorder ([place])   . Bradycardia    a. asymptomatic  . CAD in native artery    a. Nonobstructive cath 11/2007;  b. Presented with ST elevation - Nonobstructive cath 08/2011  . Chest pain, mid sternal   . Coronary artery disease   . GERD (gastroesophageal reflux disease)   . History of cocaine abuse    a. quit ? 2009  . History of ETOH abuse    a. drinks 2 "40's" / wk  . Marijuana abuse    a. uses ~ 1x /wk or less  . Pneumonia   . Stomach ulcer   . Syncope    a. 12/2010 - presumed to be vasovagal  . Tobacco abuse     Patient Active Problem List   Diagnosis Date Noted  . Major  depressive disorder, recurrent episode, severe with anxious distress ([place]) 05/23/2016  . PTSD (post-traumatic stress disorder) 05/23/2016  . Cocaine use disorder, moderate, in early remission ([place]) 05/23/2016  . Alcohol use disorder, moderate, dependence ([place]) 05/23/2016  . Affective psychosis, bipolar ([place]) 04/30/2016  . Cocaine abuse with cocaine-induced mood disorder ([place]) 03/04/2016  . Alcohol dependence with uncomplicated withdrawal ([place])   . Bipolar affective disorder, depressed, severe ([place]) 11/22/2015  . Alcohol use disorder, severe, dependence ([place]) 01/18/2015  . Alcohol dependence with alcohol-induced mood disorder ([place])   . Suicidal ideation   . Acute pancreatitis 03/20/2012  . Polysubstance abuse 03/20/2012  . Cocaine abuse 01/12/2012  . Cannabis abuse 01/12/2012  . Alcohol dependence ([place]) 10/15/2011  . Abnormal ECG   . Chest pain   . CAD in native artery   . GERD (gastroesophageal reflux disease)   . Tobacco abuse   . History of cocaine abuse   . History of ETOH abuse   . Bradycardia   . Syncope     Past Surgical History:  Procedure Laterality Date  . CARDIAC CATHETERIZATION  2009; 08/2011   [name] 08/06/2011  . LEFT HEART CATHETERIZATION WITH CORONARY ANGIOGRAM N/A 08/16/2011  Procedure: LEFT HEART CATHETERIZATION WITH CORONARY ANGIOGRAM;  Surgeon: [name], MD;  Location: [facility] CATH LAB;  Service: Cardiovascular;  Laterality: N/A;       Home Medications    Prior to Admission medications   Medication Sig Start Date End Date Taking? Authorizing Provider  nicotine (NICODERM CQ - DOSED IN MG/24 HOURS) 21 mg/24hr patch Place 1 patch (21 mg total) onto the skin daily. Patient not taking: Reported on 08/28/2016 05/23/16   Derrill Center, NP  ondansetron (ZOFRAN ODT) 4 MG disintegrating tablet Take 1 tablet (4 mg total) by mouth every 8 (eight) hours as needed for nausea or vomiting. 08/28/16   Junius Creamer, NP  oxyCODONE (ROXICODONE) 5 MG immediate release  tablet Take 1 tablet (5 mg total) by mouth every 4 (four) hours as needed for severe pain. 08/28/16   Junius Creamer, NP  sertraline (ZOLOFT) 50 MG tablet Take 1 tablet (50 mg total) by mouth daily. Patient not taking: Reported on 08/28/2016 05/23/16   Derrill Center, NP  traZODone (DESYREL) 50 MG tablet Take 1 tablet (50 mg total) by mouth at bedtime as needed for sleep. Patient not taking: Reported on 08/28/2016 05/23/16   Derrill Center, NP    Family History History reviewed. No pertinent family history.  Social History Social History  Substance Use Topics  . Smoking status: Current Some Day Smoker    Packs/day: 0.50    Years: 20.00    Types: Cigarettes    Last attempt to quit: 06/24/2015  . Smokeless tobacco: Never Used  . Alcohol use 28.8 oz/week    48 Cans of beer per week     Comment: 2 40 oz beers per day     Allergies   Aspirin; Pepperoni [pickled meat]; Tomato; and Tylenol [acetaminophen]   Review of Systems Review of Systems  Cardiovascular: Positive for chest pain.  Gastrointestinal: Positive for nausea.  All other systems reviewed and are negative.    Physical Exam Updated Vital Signs BP 110/73   Pulse (!) 39   Temp 98.3 F (36.8 C) (Oral)   Resp 16   Ht 5\' 5"  (1.651 m)   Wt 54.4 kg   SpO2 97%   BMI 19.97 kg/m   Physical Exam  Constitutional: He is oriented to person, place, and time. He appears well-developed and well-nourished.  HENT:  Head: Normocephalic and atraumatic.  Mouth/Throat: Oropharynx is clear and moist.  Eyes: Conjunctivae and EOM are normal. Pupils are equal, round, and reactive to light.  Neck: Normal range of motion.  Cardiovascular: Normal rate, regular rhythm and normal heart sounds.   Pulmonary/Chest: Effort normal and breath sounds normal.  Abdominal: Soft. Bowel sounds are normal. There is no tenderness. There is no rebound.  Musculoskeletal: Normal range of motion.  Neurological: He is alert and oriented to person, place, and  time.  Skin: Skin is warm and dry.  Psychiatric: He has a normal mood and affect.  Nursing note and vitals reviewed.    ED Treatments / Results  Labs (all labs ordered are listed, but only abnormal results are displayed) Labs Reviewed  HEPATIC FUNCTION PANEL - Abnormal; Notable for the following:       Result Value   ALT 15 (*)    All other components within normal limits  BASIC METABOLIC PANEL  CBC  LIPASE, BLOOD  I-STAT TROPOININ, ED    EKG  EKG Interpretation  Date/Time:  Tuesday September 21 2016 00:49:44 EDT Ventricular Rate:  53 PR  Interval:  166 QRS Duration: 82 QT Interval:  382 QTC Calculation: 358 R Axis:   62 Text Interpretation:  Sinus bradycardia Early repolarization Otherwise normal ECG No significant change since last tracing Confirmed by Kenna Gilbert, KRISTEN [phone]) on 09/21/2016 6:04:16 AM       Radiology Dg Chest 2 View  Result Date: 09/21/2016 CLINICAL DATA:  Acute onset of mid chest pain.  Initial encounter. EXAM: CHEST  2 VIEW COMPARISON:  Chest radiograph performed 08/27/2016 FINDINGS: The lungs are well-aerated and clear. There is no evidence of focal opacification, pleural effusion or pneumothorax. The heart is normal in size; the mediastinal contour is within normal limits. No acute osseous abnormalities are seen. IMPRESSION: No acute cardiopulmonary process seen. Electronically Signed   By: Garald Balding M.D.   On: 09/21/2016 01:39    Procedures Procedures (including critical care time)  Medications Ordered in ED Medications  acetaminophen (TYLENOL) tablet 650 mg (not administered)  famotidine (PEPCID) tablet 20 mg (20 mg Oral Given 09/21/16 0402)  gi cocktail (Maalox,Lidocaine,Donnatal) (30 mLs Oral Given 09/21/16 0402)  sucralfate (CARAFATE) tablet 1 g (1 g Oral Given 09/21/16 0402)     Initial Impression / Assessment and Plan / ED Course  I have reviewed the triage vital signs and the nursing notes.  Pertinent labs & imaging results that  were available during my care of the patient were reviewed by me and considered in my medical decision making (see chart for details).  51 year old male here with chest pain. After history and physical, it seems this is more abdominal pain with GERD type symptoms. EKG is nonischemic. Screening lab work including troponin are negative. Chest x-ray is clear. Patient was treated here with Pepcid, GI cocktail, and Carafate with improvement of his symptoms. He has been resting comfortably. On chart review patient does have multiple prior ED visits for GERD related complaints. He denies any alcohol intake or spicy food intake, but has been taking naproxen at home. May have NSAID-induced gastritis. I have low suspicion for ACS, PE, dissection, or other acute cardiac event as etiology of symptoms.  Will have him start Prilosec and Carafate for the next few weeks.  Recommended to avoid NSAIDs for now. Encouraged that he follow up with his primary care doctor. He was also given GI follow-up.  Discussed plan with patient, he acknowledged understanding and agreed with plan of care.  Return precautions given for new or worsening symptoms.  Final Clinical Impressions(s) / ED Diagnoses   Final diagnoses:  Chest pain, unspecified type  Gastroesophageal reflux disease without esophagitis    New Prescriptions New Prescriptions   OMEPRAZOLE (PRILOSEC) 20 MG CAPSULE    Take 1 capsule (20 mg total) by mouth daily.   SUCRALFATE (CARAFATE) 1 G TABLET    Take 1 tablet (1 g total) by mouth 4 (four) times daily -  with meals and at bedtime.     Larene Pickett, PA-C 09/21/16 [place], DO 09/21/16 [phone]

## 2016-09-21 NOTE — ED Triage Notes (Signed)
Pt c/o 9/10 central cp that started today pt states cp increases with deep breath, denies any SOB at this time.

## 2016-10-08 ENCOUNTER — Encounter (HOSPITAL_COMMUNITY): Payer: Self-pay | Admitting: Emergency Medicine

## 2016-10-08 ENCOUNTER — Emergency Department (HOSPITAL_COMMUNITY)
Admission: EM | Admit: 2016-10-08 | Discharge: 2016-10-08 | Disposition: A | Discharge status: Other Acute Inpatient Hospital | Payer: Medicaid Other | Attending: Emergency Medicine | Admitting: Emergency Medicine

## 2016-10-08 DIAGNOSIS — F17210 Nicotine dependence, cigarettes, uncomplicated: Secondary | ICD-10-CM | POA: Diagnosis not present

## 2016-10-08 DIAGNOSIS — F314 Bipolar disorder, current episode depressed, severe, without psychotic features: Secondary | ICD-10-CM | POA: Diagnosis present

## 2016-10-08 DIAGNOSIS — F419 Anxiety disorder, unspecified: Secondary | ICD-10-CM | POA: Diagnosis present

## 2016-10-08 DIAGNOSIS — F10288 Alcohol dependence with other alcohol-induced disorder: Secondary | ICD-10-CM | POA: Diagnosis not present

## 2016-10-08 DIAGNOSIS — Z79899 Other long term (current) drug therapy: Secondary | ICD-10-CM | POA: Insufficient documentation

## 2016-10-08 DIAGNOSIS — F1210 Cannabis abuse, uncomplicated: Secondary | ICD-10-CM | POA: Insufficient documentation

## 2016-10-08 DIAGNOSIS — F1410 Cocaine abuse, uncomplicated: Secondary | ICD-10-CM | POA: Insufficient documentation

## 2016-10-08 DIAGNOSIS — I2510 Atherosclerotic heart disease of native coronary artery without angina pectoris: Secondary | ICD-10-CM | POA: Insufficient documentation

## 2016-10-08 DIAGNOSIS — F1010 Alcohol abuse, uncomplicated: Secondary | ICD-10-CM

## 2016-10-08 DIAGNOSIS — F1910 Other psychoactive substance abuse, uncomplicated: Secondary | ICD-10-CM

## 2016-10-08 DIAGNOSIS — F1020 Alcohol dependence, uncomplicated: Secondary | ICD-10-CM

## 2016-10-08 LAB — COMPREHENSIVE METABOLIC PANEL
ALBUMIN: 4.6 g/dL (ref 3.5–5.0)
ALK PHOS: 68 U/L (ref 38–126)
ALT: 17 U/L (ref 17–63)
AST: 28 U/L (ref 15–41)
Anion gap: 11 (ref 5–15)
BUN: 10 mg/dL (ref 6–20)
CALCIUM: 9.1 mg/dL (ref 8.9–10.3)
CO2: 21 mmol/L — ABNORMAL LOW (ref 22–32)
Chloride: 105 mmol/L (ref 101–111)
Creatinine, Ser: 1.12 mg/dL (ref 0.61–1.24)
GFR calc Af Amer: >60 mL/min (ref >60)
GFR calc non Af Amer: >60 mL/min (ref >60)
GLUCOSE: 72 mg/dL (ref 65–99)
Potassium: 4 mmol/L (ref 3.5–5.1)
SODIUM: 137 mmol/L (ref 135–145)
Total Bilirubin: 0.7 mg/dL (ref 0.3–1.2)
Total Protein: 7.9 g/dL (ref 6.5–8.1)

## 2016-10-08 LAB — RAPID URINE DRUG SCREEN, HOSP PERFORMED
AMPHETAMINES: NOT DETECTED
BARBITURATES: NOT DETECTED
Benzodiazepines: NOT DETECTED
Cocaine: POSITIVE — AB
OPIATES: NOT DETECTED
TETRAHYDROCANNABINOL: POSITIVE — AB

## 2016-10-08 LAB — CBC
HEMATOCRIT: 40.6 % (ref 39.0–52.0)
HEMOGLOBIN: 14.6 g/dL (ref 13.0–17.0)
MCH: 31.5 pg (ref 26.0–34.0)
MCHC: 36 g/dL (ref 30.0–36.0)
MCV: 87.5 fL (ref 78.0–100.0)
Platelets: 256 10*3/uL (ref 150–400)
RBC: 4.64 MIL/uL (ref 4.22–5.81)
RDW: 13.1 % (ref 11.5–15.5)
WBC: 8.1 10*3/uL (ref 4.0–10.5)

## 2016-10-08 LAB — SALICYLATE LEVEL: Salicylate Lvl: <7 mg/dL (ref 2.8–30.0)

## 2016-10-08 LAB — ACETAMINOPHEN LEVEL: Acetaminophen (Tylenol), Serum: <10 ug/mL — ABNORMAL LOW (ref 10–30)

## 2016-10-08 LAB — ETHANOL: Alcohol, Ethyl (B): 149 mg/dL — ABNORMAL HIGH (ref <5)

## 2016-10-08 MED ORDER — IBUPROFEN 200 MG PO TABS
600.0000 mg | ORAL_TABLET | Freq: Three times a day (TID) | ORAL | Status: DC | PRN
  Administered 2016-10-08: 600 mg via ORAL
  Filled 2016-10-08 (×3): qty 3

## 2016-10-08 MED ORDER — NICOTINE 21 MG/24HR TD PT24: 21.0000 mg | MEDICATED_PATCH | Freq: Every day | TRANSDERMAL | Status: DC

## 2016-10-08 MED ORDER — ALUM & MAG HYDROXIDE-SIMETH 200-200-20 MG/5ML PO SUSP: 30.0000 mL | ORAL | Status: DC | PRN

## 2016-10-08 MED ORDER — ONDANSETRON HCL 4 MG PO TABS
4.0000 mg | ORAL_TABLET | Freq: Three times a day (TID) | ORAL | Status: DC | PRN
Start: 2016-10-08 — End: 2016-10-08

## 2016-10-08 NOTE — ED Notes (Signed)
Pt now c/o h/a.  Dr. Randal Buba informed.

## 2016-10-08 NOTE — ED Notes (Signed)
TTS assessment in progress. 

## 2016-10-08 NOTE — ED Notes (Signed)
[facility] called for transport. 

## 2016-10-08 NOTE — [facility] Counselor (Signed)
Clinician faxed the pt's referral to the following inpatient treatment facilities:   [facility]  TTS will continue to follow up and seek placement.    Edd Fabian, MS, [facility], [facility] Triage Specialist [phone]

## 2016-10-08 NOTE — ED Triage Notes (Signed)
Pt comes in with complaints of polysubstance abuse and homicidal ideations towards his family members.  Pt denies SI at this time. Pt ambulatory and independent during triage.  A&O x4.  States he has had a lot going on and is feeling depressed as well. Endorses being on medications for depression but has not been taking them because he has been "feeling better".

## 2016-10-08 NOTE — ED Notes (Signed)
[facility] transport on unit to transfer pt to [facility] per MD order. Pt signed for personal property and property given to [facility] transport for transfer. Pt signed e-signature. No s/s of acute distress noted. Pt ambulatory off unit.

## 2016-10-08 NOTE — BH Assessment (Signed)
[facility] Assessment Progress Note  Please see Treylese Bennett's note dated 10/08/2016 at 06:13.  This pt, who is currently under voluntary status, agrees to transfer.  Waylan Boga, DNP, concurs with decision.  Pt's nurse, Caryl Pina, has been notified.  Pt is to be transported via [facility] when the time comes.  Jalene Mullet, [facility] Triage Specialist [phone]

## 2016-10-08 NOTE — ED Notes (Signed)
Patient wanded by security prior to triage
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
## 2016-10-08 NOTE — BHH Counselor (Addendum)
Clinician received a call from [place] at [facility] and noted the pt has been accepted to the [facility], after 1000. Attending physician: Dr. Reece Levy. Nursing report: [phone]. Updated disposition discussed with Darryll Capers, RN.    Edd Fabian, MS, [facility], [facility] Triage Specialist [phone]
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
## 2016-10-08 NOTE — ED Notes (Signed)
Patient educated about search process and term "contraband " and routine search performed. No contraband found. 

## 2016-12-15 ENCOUNTER — Emergency Department (HOSPITAL_COMMUNITY)
Admission: EM | Admit: 2016-12-15 | Discharge: 2016-12-16 | Disposition: A | Discharge status: Home / Self Care | Payer: Medicaid Other | Attending: Emergency Medicine | Admitting: Emergency Medicine

## 2016-12-15 ENCOUNTER — Encounter (HOSPITAL_COMMUNITY): Payer: Self-pay

## 2016-12-15 DIAGNOSIS — F17210 Nicotine dependence, cigarettes, uncomplicated: Secondary | ICD-10-CM | POA: Insufficient documentation

## 2016-12-15 DIAGNOSIS — Z7289 Other problems related to lifestyle: Secondary | ICD-10-CM | POA: Diagnosis not present

## 2016-12-15 DIAGNOSIS — R45851 Suicidal ideations: Secondary | ICD-10-CM | POA: Diagnosis present

## 2016-12-15 DIAGNOSIS — F1024 Alcohol dependence with alcohol-induced mood disorder: Secondary | ICD-10-CM | POA: Insufficient documentation

## 2016-12-15 DIAGNOSIS — I2510 Atherosclerotic heart disease of native coronary artery without angina pectoris: Secondary | ICD-10-CM | POA: Insufficient documentation

## 2016-12-15 DIAGNOSIS — Y907 Blood alcohol level of 200-239 mg/100 ml: Secondary | ICD-10-CM | POA: Insufficient documentation

## 2016-12-15 DIAGNOSIS — F1020 Alcohol dependence, uncomplicated: Secondary | ICD-10-CM | POA: Diagnosis present

## 2016-12-15 DIAGNOSIS — F1410 Cocaine abuse, uncomplicated: Secondary | ICD-10-CM | POA: Insufficient documentation

## 2016-12-15 LAB — CBC WITH DIFFERENTIAL/PLATELET
BASOS PCT: 0 %
Basophils Absolute: 0 10*3/uL (ref 0.0–0.1)
EOS ABS: 0.1 10*3/uL (ref 0.0–0.7)
Eosinophils Relative: 2 %
HCT: 40.2 % (ref 39.0–52.0)
HEMOGLOBIN: 14.2 g/dL (ref 13.0–17.0)
Lymphocytes Relative: 50 %
Lymphs Abs: 2.3 10*3/uL (ref 0.7–4.0)
MCH: 31.4 pg (ref 26.0–34.0)
MCHC: 35.3 g/dL (ref 30.0–36.0)
MCV: 88.9 fL (ref 78.0–100.0)
Monocytes Absolute: 0.3 10*3/uL (ref 0.1–1.0)
Monocytes Relative: 7 %
NEUTROS PCT: 41 %
Neutro Abs: 1.9 10*3/uL (ref 1.7–7.7)
PLATELETS: 240 10*3/uL (ref 150–400)
RBC: 4.52 MIL/uL (ref 4.22–5.81)
RDW: 13.6 % (ref 11.5–15.5)
WBC: 4.6 10*3/uL (ref 4.0–10.5)

## 2016-12-15 LAB — COMPREHENSIVE METABOLIC PANEL
ALBUMIN: 4.5 g/dL (ref 3.5–5.0)
ALK PHOS: 63 U/L (ref 38–126)
ALT: 18 U/L (ref 17–63)
ANION GAP: 7 (ref 5–15)
AST: 21 U/L (ref 15–41)
BUN: 12 mg/dL (ref 6–20)
CALCIUM: 9.2 mg/dL (ref 8.9–10.3)
CHLORIDE: 109 mmol/L (ref 101–111)
CO2: 28 mmol/L (ref 22–32)
CREATININE: 1.16 mg/dL (ref 0.61–1.24)
GFR calc Af Amer: >60 mL/min (ref >60)
GFR calc non Af Amer: >60 mL/min (ref >60)
GLUCOSE: 81 mg/dL (ref 65–99)
Potassium: 3.6 mmol/L (ref 3.5–5.1)
SODIUM: 144 mmol/L (ref 135–145)
Total Bilirubin: 0.3 mg/dL (ref 0.3–1.2)
Total Protein: 8.1 g/dL (ref 6.5–8.1)

## 2016-12-15 LAB — ETHANOL: ALCOHOL ETHYL (B): 220 mg/dL — AB (ref <5)

## 2016-12-15 MED ORDER — LORAZEPAM 2 MG/ML IJ SOLN: 0.0000 mg | Freq: Two times a day (BID) | INTRAMUSCULAR | Status: DC

## 2016-12-15 MED ORDER — LORAZEPAM 1 MG PO TABS
0.0000 mg | ORAL_TABLET | Freq: Four times a day (QID) | ORAL | Status: DC
  Administered 2016-12-16: 1 mg via ORAL
  Administered 2016-12-16: 2 mg via ORAL
  Filled 2016-12-15: qty 2
  Filled 2016-12-15: qty 1

## 2016-12-15 MED ORDER — LORAZEPAM 2 MG/ML IJ SOLN
0.0000 mg | Freq: Four times a day (QID) | INTRAMUSCULAR | Status: DC
Start: 2016-12-15 — End: 2016-12-16

## 2016-12-15 MED ORDER — THIAMINE HCL 100 MG/ML IJ SOLN: 100.0000 mg | Freq: Every day | INTRAMUSCULAR | Status: DC

## 2016-12-15 MED ORDER — LORAZEPAM 1 MG PO TABS: 0.0000 mg | ORAL_TABLET | Freq: Two times a day (BID) | ORAL | Status: DC

## 2016-12-15 MED ORDER — VITAMIN B-1 100 MG PO TABS
100.0000 mg | ORAL_TABLET | Freq: Every day | ORAL | Status: DC
  Administered 2016-12-16: 100 mg via ORAL
  Filled 2016-12-15: qty 1

## 2016-12-15 NOTE — ED Notes (Signed)
Pt is alert and orinted x 4 and is verbally responisve. Pt states that he had contact [employer] because he feels no one understands him, he states that he has had suicidal idealation in the past and does not feel that he will harm himself but feels that he is in a state in which he may harm someone else and that is why he is hear to get help. Pt reports cocaine/crack use and states that he drinks alcohol (3) 40oz daily. Pt states that he use to have two friends that sat on his shoulder that would tell him things to do. He states at times he does see things that other people do not see, but he tries not to see them. Pt is cooperative, and tearful, and is observed at times talking to himself.

## 2016-12-15 NOTE — ED Provider Notes (Signed)
[place] DEPT Provider Note   CSN: [id number] Arrival date & time: 12/15/16  2003     History   Chief Complaint Chief Complaint  Patient presents with  . Psychiatric Evaluation    HPI [name] [name] is a 51 y.o. male.  51 year old male presents requesting help for his suicidal ideations without a plan. Does admit to alcohol use today as well as cocaine. States that he has been more depressed. Denies responding to internal stimuli. Denies any homicidal ideations. Has had increased stress to his current living situation with his fiance as well as with his mother. Denies any somatic complaints of abdominal or chest discomfort. Called EMS and was transported here      Past Medical History:  Diagnosis Date  . Abnormal ECG    a. early repolarization  . Arthritis    "my whole left side" (05/03/2014)  . Bipolar disorder ([place])   . Bradycardia    a. asymptomatic  . CAD in native artery    a. Nonobstructive cath 11/2007;  b. Presented with ST elevation - Nonobstructive cath 08/2011  . Chest pain, mid sternal   . Coronary artery disease   . GERD (gastroesophageal reflux disease)   . History of cocaine abuse    a. quit ? 2009  . History of ETOH abuse    a. drinks 2 "40's" / wk  . Marijuana abuse    a. uses ~ 1x /wk or less  . Pneumonia   . Stomach ulcer   . Syncope    a. 12/2010 - presumed to be vasovagal  . Tobacco abuse     Patient Active Problem List   Diagnosis Date Noted  . Major depressive disorder, recurrent episode, severe with anxious distress ([place]) 05/23/2016  . PTSD (post-traumatic stress disorder) 05/23/2016  . Cocaine use disorder, moderate, in early remission ([place]) 05/23/2016  . Alcohol use disorder, moderate, dependence ([place]) 05/23/2016  . Affective psychosis, bipolar ([place]) 04/30/2016  . Cocaine abuse with cocaine-induced mood disorder ([place]) 03/04/2016  . Alcohol dependence with uncomplicated withdrawal ([place])   . Bipolar affective disorder, depressed,  severe ([place]) 11/22/2015  . Alcohol use disorder, severe, dependence ([place]) 01/18/2015  . Alcohol dependence with alcohol-induced mood disorder ([place])   . Suicidal ideation   . Acute pancreatitis 03/20/2012  . Polysubstance abuse 03/20/2012  . Cocaine abuse 01/12/2012  . Cannabis abuse 01/12/2012  . Alcohol dependence ([place]) 10/15/2011  . Abnormal ECG   . Chest pain   . CAD in native artery   . GERD (gastroesophageal reflux disease)   . Tobacco abuse   . History of cocaine abuse   . History of ETOH abuse   . Bradycardia   . Syncope     Past Surgical History:  Procedure Laterality Date  . CARDIAC CATHETERIZATION  2009; 08/2011   [name] 08/06/2011  . LEFT HEART CATHETERIZATION WITH CORONARY ANGIOGRAM N/A 08/16/2011   Procedure: LEFT HEART CATHETERIZATION WITH CORONARY ANGIOGRAM;  Surgeon: [name], MD;  Location: [facility] CATH LAB;  Service: Cardiovascular;  Laterality: N/A;       Home Medications    Prior to Admission medications   Medication Sig Start Date End Date Taking? Authorizing Provider  nicotine (NICODERM CQ - DOSED IN MG/24 HOURS) 21 mg/24hr patch Place 1 patch (21 mg total) onto the skin daily. Patient not taking: Reported on 08/28/2016 05/23/16   Derrill Center, NP  omeprazole (PRILOSEC) 20 MG capsule Take 1 capsule (20 mg total) by mouth daily. Patient not  taking: Reported on 10/08/2016 09/21/16   Larene Pickett, PA-C  ondansetron (ZOFRAN ODT) 4 MG disintegrating tablet Take 1 tablet (4 mg total) by mouth every 8 (eight) hours as needed for nausea or vomiting. Patient not taking: Reported on 10/08/2016 08/28/16   Junius Creamer, NP  oxyCODONE (ROXICODONE) 5 MG immediate release tablet Take 1 tablet (5 mg total) by mouth every 4 (four) hours as needed for severe pain. Patient not taking: Reported on 10/08/2016 08/28/16   Junius Creamer, NP  sertraline (ZOLOFT) 50 MG tablet Take 1 tablet (50 mg total) by mouth daily. Patient not taking: Reported on 08/28/2016 05/23/16   Derrill Center, NP  sucralfate (CARAFATE) 1 g tablet Take 1 tablet (1 g total) by mouth 4 (four) times daily -  with meals and at bedtime. Patient not taking: Reported on 10/08/2016 09/21/16   Larene Pickett, PA-C  traZODone (DESYREL) 50 MG tablet Take 1 tablet (50 mg total) by mouth at bedtime as needed for sleep. Patient not taking: Reported on 08/28/2016 05/23/16   Derrill Center, NP    Family History History reviewed. No pertinent family history.  Social History Social History  Substance Use Topics  . Smoking status: Current Some Day Smoker    Packs/day: 0.50    Years: 20.00    Types: Cigarettes    Last attempt to quit: 06/24/2015  . Smokeless tobacco: Never Used  . Alcohol use 28.8 oz/week    48 Cans of beer per week     Comment: 2 40 oz beers per day     Allergies   Aspirin; Pepperoni [pickled meat]; Tomato; and Tylenol [acetaminophen]   Review of Systems Review of Systems  All other systems reviewed and are negative.    Physical Exam Updated Vital Signs BP 124/77 (BP Location: Right Arm)   Pulse 91   Temp 98.6 F (37 C) (Oral)   Resp 18   Ht 1.626 m (5\' 4" )   Wt 56.2 kg (124 lb)   SpO2 94%   BMI 21.28 kg/m   Physical Exam  Constitutional: He is oriented to person, place, and time. He appears well-developed and well-nourished.  Non-toxic appearance. No distress.  HENT:  Head: Normocephalic and atraumatic.  Eyes: Conjunctivae, EOM and lids are normal. Pupils are equal, round, and reactive to light.  Neck: Normal range of motion. Neck supple. No tracheal deviation present. No thyroid mass present.  Cardiovascular: Normal rate, regular rhythm and normal heart sounds.  Exam reveals no gallop.   No murmur heard. Pulmonary/Chest: Effort normal and breath sounds normal. No stridor. No respiratory distress. He has no decreased breath sounds. He has no wheezes. He has no rhonchi. He has no rales.  Abdominal: Soft. Normal appearance and bowel sounds are normal. He exhibits  no distension. There is no tenderness. There is no rebound and no CVA tenderness.  Musculoskeletal: Normal range of motion. He exhibits no edema or tenderness.  Neurological: He is alert and oriented to person, place, and time. He has normal strength. No cranial nerve deficit or sensory deficit. GCS eye subscore is 4. GCS verbal subscore is 5. GCS motor subscore is 6.  Skin: Skin is warm and dry. No abrasion and no rash noted.  Psychiatric: His speech is normal. He is withdrawn. He exhibits a depressed mood. He expresses suicidal ideation. He expresses no suicidal plans.  Nursing note and vitals reviewed.    ED Treatments / Results  Labs (all labs ordered are listed, but  only abnormal results are displayed) Labs Reviewed  ETHANOL - Abnormal; Notable for the following:       Result Value   Alcohol, Ethyl (B) 220 (*)    All other components within normal limits  CBC WITH DIFFERENTIAL/PLATELET  COMPREHENSIVE METABOLIC PANEL  RAPID URINE DRUG SCREEN, HOSP PERFORMED    EKG  EKG Interpretation None       Radiology No results found.  Procedures Procedures (including critical care time)  Medications Ordered in ED Medications  LORazepam (ATIVAN) injection 0-4 mg (not administered)    Or  LORazepam (ATIVAN) tablet 0-4 mg (not administered)  LORazepam (ATIVAN) injection 0-4 mg (not administered)    Or  LORazepam (ATIVAN) tablet 0-4 mg (not administered)  thiamine (VITAMIN B-1) tablet 100 mg (not administered)    Or  thiamine (B-1) injection 100 mg (not administered)     Initial Impression / Assessment and Plan / ED Course  I have reviewed the triage vital signs and the nursing notes.  Pertinent labs & imaging results that were available during my care of the patient were reviewed by me and considered in my medical decision making (see chart for details).   patient clinically able to carry on conversations time. He is cleared for psychiatric disposition  Final Clinical  Impressions(s) / ED Diagnoses   Final diagnoses:  None    New Prescriptions New Prescriptions   No medications on file     Lacretia Leigh, MD 12/15/16 2322

## 2016-12-15 NOTE — ED Notes (Signed)
Bed: WA30 Expected date:  Expected time:  Means of arrival:  Comments: 

## 2016-12-15 NOTE — ED Triage Notes (Signed)
Pt arrived via [facility] pt was found on [facility] and contact [employer] as pt stated that he has been having past thoughts of harming himself. Pt is voluntary. Per [employer] pt has been having conversation with himself. Per [employer] pt has been cooperative. Pt wanted to come be evaluated .

## 2016-12-16 LAB — RAPID URINE DRUG SCREEN, HOSP PERFORMED
Amphetamines: NOT DETECTED
BARBITURATES: NOT DETECTED
BENZODIAZEPINES: NOT DETECTED
Cocaine: POSITIVE — AB
Opiates: NOT DETECTED
Tetrahydrocannabinol: NOT DETECTED

## 2016-12-16 MED ORDER — GABAPENTIN 300 MG PO CAPS: 300.0000 mg | ORAL_CAPSULE | Freq: Three times a day (TID) | ORAL | 0 refills | Status: DC

## 2016-12-16 MED ORDER — SERTRALINE HCL 50 MG PO TABS: 50.0000 mg | ORAL_TABLET | Freq: Every day | ORAL | 0 refills | Status: DC

## 2016-12-16 MED ORDER — TRAZODONE HCL 50 MG PO TABS: 50.0000 mg | ORAL_TABLET | Freq: Every evening | ORAL | 0 refills | Status: DC | PRN

## 2016-12-16 MED ORDER — GI COCKTAIL ~~LOC~~
30.0000 mL | Freq: Once | ORAL | Status: AC
  Administered 2016-12-16: 30 mL via ORAL
  Filled 2016-12-16: qty 30

## 2016-12-16 MED ORDER — PANTOPRAZOLE SODIUM 40 MG PO TBEC
40.0000 mg | DELAYED_RELEASE_TABLET | Freq: Every day | ORAL | Status: DC
  Administered 2016-12-16 (×2): 40 mg via ORAL
  Filled 2016-12-16 (×2): qty 1

## 2016-12-16 MED ORDER — GABAPENTIN 300 MG PO CAPS: 300.0000 mg | ORAL_CAPSULE | Freq: Three times a day (TID) | ORAL | Status: DC

## 2016-12-16 NOTE — ED Notes (Signed)
Pt made aware urine  specimen is need

## 2016-12-16 NOTE — Discharge Instructions (Signed)
For your ongoing mental health needs, you are advised to follow up with [facility].  New and returning patients are seen at their walk-in clinic.  Walk-in hours are Monday - Friday from 8:00 am - 3:00 pm.  Walk-in patients are seen on a first come, first served basis.  Try to arrive as early as possible for he best chance of being seen the same day: ° °     [facility] °     [address] °     [place], [place] [address] °     [phone] °

## 2016-12-16 NOTE — ED Notes (Signed)
Held pt meds Protonix and GI cocktail as pt was asleep, and became angry prior when MD awoke him for assessment.

## 2016-12-16 NOTE — Progress Notes (Signed)
CSW spoke with patient regarding discharge plans. Patient states he was living with his girlfriend but "she isnt gonna let me come back". Patient stated he does not have any other friends or family in the area. CSW provided patient with shelter resources and a bus pass for transportation.   Kingsley Spittle, [facility] Room Clinical Social Worker [phone]

## 2016-12-16 NOTE — ED Notes (Signed)
Pt is for AM psych eval.

## 2016-12-16 NOTE — BH Assessment (Signed)
[facility] Assessment Progress Note  Per Hampton Abbot, MD, this pt does not require psychiatric hospitalization at this time.  Pt is to be discharged from [facility] with recommendation to follow up with [facility].  This has been included in pt's discharge instructions.  Pt's nurse has been notified.  Jalene Mullet, [place] Triage Specialist [phone]
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
## 2016-12-16 NOTE — [facility] Suicide Risk Assessment (Signed)
Suicide Risk Assessment  Discharge Assessment   [facility] Discharge Suicide Risk Assessment   Principal Problem: Alcohol dependence with alcohol-induced mood disorder [name]) Discharge Diagnoses:  Patient Active Problem List   Diagnosis Date Noted  . Alcohol use disorder, severe, dependence ([place]) [F10.20] 01/18/2015    Priority: High  . Alcohol dependence with alcohol-induced mood disorder ([place]) [F10.24]     Priority: High  . Cocaine abuse [F14.10] 01/12/2012    Priority: High  . Alcohol dependence ([place]) [F10.20] 10/15/2011    Priority: High  . Major depressive disorder, recurrent episode, severe with anxious distress ([employer]) [F33.2] 05/23/2016  . PTSD (post-traumatic stress disorder) [F43.10] 05/23/2016  . Cocaine use disorder, moderate, in early remission ([place]) [F14.21] 05/23/2016  . Alcohol use disorder, moderate, dependence ([place]) [F10.20] 05/23/2016  . Affective psychosis, bipolar ([place]) [F31.9] 04/30/2016  . Cocaine abuse with cocaine-induced mood disorder ([place]) [F14.14] 03/04/2016  . Alcohol dependence with uncomplicated withdrawal ([place]) [F10.230]   . Suicidal ideation [R45.851]   . Acute pancreatitis [K85.90] 03/20/2012  . Polysubstance abuse [F19.10] 03/20/2012  . Cannabis abuse [F12.10] 01/12/2012  . Abnormal ECG [R94.31]   . Chest pain [R07.89]   . CAD in native artery [I25.10]   . GERD (gastroesophageal reflux disease) [K21.9]   . Tobacco abuse [Z72.0]   . History of cocaine abuse [Z87.898]   . History of ETOH abuse [Z87.898]   . Bradycardia [R00.1]   . Syncope [R55]     Total Time spent with patient: 45 minutes  Musculoskeletal: Strength & Muscle Tone: within normal limits Gait & Station: normal Patient leans: N/A  Psychiatric Specialty Exam:   Blood pressure (!) 96/58, pulse 65, temperature 97.9 F (36.6 C), temperature source Oral, resp. rate 18, height 5\' 4"  (1.626 m), weight 56.2 kg (124 lb), SpO2 97 %.Body mass index is 21.28 kg/m.  General Appearance: Casual   Eye Contact::  Good  Speech:  Normal Rate409  Volume:  Normal  Mood:  Irritable  Affect:  Congruent  Thought Process:  Coherent and Descriptions of Associations: Intact  Orientation:  Full (Time, Place, and Person)  Thought Content:  WDL and Logical  Suicidal Thoughts:  No  Homicidal Thoughts:  No  Memory:  Immediate;   Good Recent;   Good Remote;   Good  Judgement:  Fair  Insight:  Fair  Psychomotor Activity:  Normal  Concentration:  Good  Recall:  Good  Fund of Knowledge:Fair  Language: Good  Akathisia:  No  Handed:  Right  AIMS (if indicated):     Assets:  Leisure Time Physical Health Resilience  Sleep:     Cognition: WNL  ADL's:  Intact   Mental Status Per Nursing Assessment::   On Admission:   Cocaine and alcohol abuse with passive, intermittent suicidal ideations on admission.  On assessment, he reports his stressor is his girlfriend who he lives with which triggers his substance abuse.  No suicidal/homicidal ideations, hallucinations, or withdrawals on rounding.  Interested in shelter resources and outpatient services, provided.  Demographic Factors:  Male  Loss Factors: NA  Historical Factors: NA  Risk Reduction Factors:   Sense of responsibility to family  Continued Clinical Symptoms:  None  Cognitive Features That Contribute To Risk:  None    Suicide Risk:  Minimal: No identifiable suicidal ideation.  Patients presenting with no risk factors but with morbid ruminations; may be classified as minimal risk based on the severity of the depressive symptoms    Plan Of Care/Follow-up recommendations:  Activity:  as tolerated  Diet:  heart healthy diet  Marvia Troost, Theodoro Clock, NP 12/16/2016, 12:39 PM

## 2017-02-05 ENCOUNTER — Encounter (HOSPITAL_COMMUNITY): Payer: Self-pay | Admitting: *Deleted

## 2017-02-05 ENCOUNTER — Emergency Department (HOSPITAL_COMMUNITY)
Admission: EM | Admit: 2017-02-05 | Discharge: 2017-02-05 | Disposition: A | Discharge status: Home / Self Care | Payer: Medicaid Other | Attending: Emergency Medicine | Admitting: Emergency Medicine

## 2017-02-05 DIAGNOSIS — Z5321 Procedure and treatment not carried out due to patient leaving prior to being seen by health care provider: Secondary | ICD-10-CM | POA: Diagnosis not present

## 2017-02-05 DIAGNOSIS — R4589 Other symptoms and signs involving emotional state: Secondary | ICD-10-CM | POA: Diagnosis present

## 2017-02-05 LAB — ETHANOL: Alcohol, Ethyl (B): 232 mg/dL — ABNORMAL HIGH (ref <5)

## 2017-02-05 LAB — COMPREHENSIVE METABOLIC PANEL
ALBUMIN: 4.6 g/dL (ref 3.5–5.0)
ALK PHOS: 65 U/L (ref 38–126)
ALT: 16 U/L — AB (ref 17–63)
ANION GAP: 12 (ref 5–15)
AST: 22 U/L (ref 15–41)
BILIRUBIN TOTAL: 0.5 mg/dL (ref 0.3–1.2)
BUN: 7 mg/dL (ref 6–20)
CALCIUM: 9.2 mg/dL (ref 8.9–10.3)
CO2: 21 mmol/L — AB (ref 22–32)
CREATININE: 1.13 mg/dL (ref 0.61–1.24)
Chloride: 108 mmol/L (ref 101–111)
GFR calc Af Amer: >60 mL/min (ref >60)
GFR calc non Af Amer: >60 mL/min (ref >60)
GLUCOSE: 79 mg/dL (ref 65–99)
Potassium: 3.6 mmol/L (ref 3.5–5.1)
SODIUM: 141 mmol/L (ref 135–145)
TOTAL PROTEIN: 7.9 g/dL (ref 6.5–8.1)

## 2017-02-05 LAB — RAPID URINE DRUG SCREEN, HOSP PERFORMED
AMPHETAMINES: NOT DETECTED
Barbiturates: NOT DETECTED
Benzodiazepines: NOT DETECTED
Cocaine: POSITIVE — AB
OPIATES: NOT DETECTED
Tetrahydrocannabinol: POSITIVE — AB

## 2017-02-05 LAB — CBC
HCT: 40.8 % (ref 39.0–52.0)
Hemoglobin: 14.8 g/dL (ref 13.0–17.0)
MCH: 31.8 pg (ref 26.0–34.0)
MCHC: 36.3 g/dL — ABNORMAL HIGH (ref 30.0–36.0)
MCV: 87.7 fL (ref 78.0–100.0)
PLATELETS: 236 10*3/uL (ref 150–400)
RBC: 4.65 MIL/uL (ref 4.22–5.81)
RDW: 13.1 % (ref 11.5–15.5)
WBC: 5.5 10*3/uL (ref 4.0–10.5)

## 2017-02-05 NOTE — ED Notes (Signed)
Pt has been called to go back to a room and no answer.

## 2017-02-05 NOTE — ED Notes (Signed)
Pt called for reassessment, no response. Pt is not seen in the lobby

## 2017-02-05 NOTE — ED Triage Notes (Signed)
The pt reports that he feels sick from using alcohol and cocaine  His last use of both were 2 hours ago

## 2017-02-05 NOTE — ED Notes (Signed)
No longer in the waiting room

## 2017-02-06 ENCOUNTER — Emergency Department (HOSPITAL_COMMUNITY): Payer: Medicaid Other

## 2017-02-06 ENCOUNTER — Encounter (HOSPITAL_COMMUNITY): Payer: Self-pay

## 2017-02-06 ENCOUNTER — Emergency Department (HOSPITAL_COMMUNITY)
Admission: EM | Admit: 2017-02-06 | Discharge: 2017-02-06 | Disposition: A | Discharge status: Home / Self Care | Payer: Medicaid Other | Attending: Emergency Medicine | Admitting: Emergency Medicine

## 2017-02-06 DIAGNOSIS — Z79899 Other long term (current) drug therapy: Secondary | ICD-10-CM | POA: Insufficient documentation

## 2017-02-06 DIAGNOSIS — F17210 Nicotine dependence, cigarettes, uncomplicated: Secondary | ICD-10-CM | POA: Insufficient documentation

## 2017-02-06 DIAGNOSIS — R51 Headache: Secondary | ICD-10-CM | POA: Diagnosis present

## 2017-02-06 DIAGNOSIS — I2510 Atherosclerotic heart disease of native coronary artery without angina pectoris: Secondary | ICD-10-CM | POA: Diagnosis not present

## 2017-02-06 DIAGNOSIS — G44209 Tension-type headache, unspecified, not intractable: Secondary | ICD-10-CM | POA: Diagnosis not present

## 2017-02-06 MED ORDER — DIPHENHYDRAMINE HCL 50 MG/ML IJ SOLN
12.5000 mg | Freq: Once | INTRAMUSCULAR | Status: AC
  Administered 2017-02-06: 12.5 mg via INTRAVENOUS
  Filled 2017-02-06: qty 1

## 2017-02-06 MED ORDER — PROCHLORPERAZINE EDISYLATE 5 MG/ML IJ SOLN
10.0000 mg | Freq: Once | INTRAMUSCULAR | Status: AC
  Administered 2017-02-06: 10 mg via INTRAVENOUS
  Filled 2017-02-06: qty 2

## 2017-02-06 MED ORDER — SODIUM CHLORIDE 0.9 % IV BOLUS (SEPSIS)
1000.0000 mL | Freq: Once | INTRAVENOUS | Status: AC
  Administered 2017-02-06: 1000 mL via INTRAVENOUS

## 2017-02-06 NOTE — ED Notes (Signed)
Sprite provided as requested.

## 2017-02-06 NOTE — ED Notes (Signed)
Pt BIB [facility] c/o headache that started at 0000. He states that he has never had a headache like this before. He reports that he has been drinking a lot and not eating much. He denies N/V. A&Ox4.

## 2017-02-06 NOTE — ED Provider Notes (Signed)
[facility] DEPT Provider Note   CSN: [id number] Arrival date & time: 02/06/17  0038     History   Chief Complaint Chief Complaint  Patient presents with  . Headache    HPI [name] [name] is a 51 y.o. male.  HPI  The patient, with a past medical history of alcohol abuse, bipolar disorder, presents to ED for evaluation of headache for the past day. Described as throbbing and located all throughout head. He states that yesterday approximately 36 hours ago, he began having "a really bad headache." He denies any history of migraines or headaches in the past. He states that he has been drinking more alcohol and not as much food. He reports nausea but denies any vomiting. He denies any falls, head injuries, numbness, numbness, vision changes, chest pain, trouble breathing.  Past Medical History:  Diagnosis Date  . Abnormal ECG    a. early repolarization  . Arthritis    "my whole left side" (05/03/2014)  . Bipolar disorder ([place])   . Bradycardia    a. asymptomatic  . CAD in native artery    a. Nonobstructive cath 11/2007;  b. Presented with ST elevation - Nonobstructive cath 08/2011  . Chest pain, mid sternal   . Coronary artery disease   . GERD (gastroesophageal reflux disease)   . History of cocaine abuse    a. quit ? 2009  . History of ETOH abuse    a. drinks 2 "40's" / wk  . Marijuana abuse    a. uses ~ 1x /wk or less  . Pneumonia   . Stomach ulcer   . Syncope    a. 12/2010 - presumed to be vasovagal  . Tobacco abuse     Patient Active Problem List   Diagnosis Date Noted  . Major depressive disorder, recurrent episode, severe with anxious distress ([place]) 05/23/2016  . PTSD (post-traumatic stress disorder) 05/23/2016  . Cocaine use disorder, moderate, in early remission ([place]) 05/23/2016  . Alcohol use disorder, moderate, dependence ([place]) 05/23/2016  . Affective psychosis, bipolar ([place]) 04/30/2016  . Cocaine abuse with cocaine-induced mood disorder ([place]) 03/04/2016  .  Alcohol dependence with uncomplicated withdrawal ([place])   . Alcohol use disorder, severe, dependence ([place]) 01/18/2015  . Alcohol dependence with alcohol-induced mood disorder ([place])   . Suicidal ideation   . Acute pancreatitis 03/20/2012  . Polysubstance abuse 03/20/2012  . Cocaine abuse 01/12/2012  . Cannabis abuse 01/12/2012  . Alcohol dependence ([place]) 10/15/2011  . Abnormal ECG   . Chest pain   . CAD in native artery   . GERD (gastroesophageal reflux disease)   . Tobacco abuse   . History of cocaine abuse   . History of ETOH abuse   . Bradycardia   . Syncope     Past Surgical History:  Procedure Laterality Date  . CARDIAC CATHETERIZATION  2009; 08/2011   [name] 08/06/2011  . LEFT HEART CATHETERIZATION WITH CORONARY ANGIOGRAM N/A 08/16/2011   Procedure: LEFT HEART CATHETERIZATION WITH CORONARY ANGIOGRAM;  Surgeon: [name], MD;  [facility];  Service: Cardiovascular;  Laterality: N/A;       Home Medications    Prior to Admission medications   Medication Sig Start Date End Date Taking? Authorizing Provider  gabapentin (NEURONTIN) 300 MG capsule Take 1 capsule (300 mg total) by mouth 3 (three) times daily. 12/16/16   [name], NP  nicotine (NICODERM CQ - DOSED IN MG/24 HOURS) 21 mg/24hr patch Place 1 patch (21 mg total)  onto the skin daily. Patient not taking: Reported on 08/28/2016 05/23/16   Derrill Center, NP  omeprazole (PRILOSEC) 20 MG capsule Take 1 capsule (20 mg total) by mouth daily. Patient not taking: Reported on 10/08/2016 09/21/16   Larene Pickett, PA-C  ondansetron (ZOFRAN ODT) 4 MG disintegrating tablet Take 1 tablet (4 mg total) by mouth every 8 (eight) hours as needed for nausea or vomiting. Patient not taking: Reported on 10/08/2016 08/28/16   Junius Creamer, NP  oxyCODONE (ROXICODONE) 5 MG immediate release tablet Take 1 tablet (5 mg total) by mouth every 4 (four) hours as needed for severe pain. Patient not taking: Reported on 10/08/2016  08/28/16   Junius Creamer, NP  sertraline (ZOLOFT) 50 MG tablet Take 1 tablet (50 mg total) by mouth daily. 12/16/16   [name], NP  sucralfate (CARAFATE) 1 g tablet Take 1 tablet (1 g total) by mouth 4 (four) times daily -  with meals and at bedtime. Patient not taking: Reported on 10/08/2016 09/21/16   Larene Pickett, PA-C  traZODone (DESYREL) 50 MG tablet Take 1 tablet (50 mg total) by mouth at bedtime as needed for sleep. 12/16/16   [name], NP    Family History History reviewed. No pertinent family history.  Social History Social History  Substance Use Topics  . Smoking status: Current Some Day Smoker    Packs/day: 0.50    Years: 20.00    Types: Cigarettes    Last attempt to quit: 06/24/2015  . Smokeless tobacco: Never Used  . Alcohol use 28.8 oz/week    48 Cans of beer per week     Comment: 2 40 oz beers per day     Allergies   Aspirin; Pepperoni [pickled meat]; Tomato; and Tylenol [acetaminophen]   Review of Systems Review of Systems  Constitutional: Negative for appetite change, chills and fever.  HENT: Negative for ear pain, rhinorrhea, sneezing and sore throat.   Eyes: Negative for photophobia and visual disturbance.  Respiratory: Negative for cough, chest tightness, shortness of breath and wheezing.   Cardiovascular: Negative for chest pain and palpitations.  Gastrointestinal: Positive for nausea. Negative for abdominal pain, blood in stool, constipation, diarrhea and vomiting.  Genitourinary: Negative for dysuria, hematuria and urgency.  Musculoskeletal: Negative for myalgias.  Skin: Negative for rash.  Neurological: Positive for light-headedness and headaches. Negative for dizziness and weakness.     Physical Exam Updated Vital Signs BP 101/62 (BP Location: Left Arm)   Pulse 66   Temp 98.5 F (36.9 C) (Oral)   Resp 12   SpO2 98%   Physical Exam  Constitutional: He is oriented to person, place, and time. He appears well-developed and  well-nourished. No distress.  Nontoxic appearing but does appear uncomfortable.  HENT:  Head: Normocephalic and atraumatic.  Nose: Nose normal.  Eyes: Pupils are equal, round, and reactive to light. Conjunctivae and EOM are normal. Right eye exhibits no discharge. Left eye exhibits no discharge. No scleral icterus.  Neck: Normal range of motion. Neck supple.  Cardiovascular: Normal rate, regular rhythm, normal heart sounds and intact distal pulses.  Exam reveals no gallop and no friction rub.   No murmur heard. Pulmonary/Chest: Effort normal and breath sounds normal. No respiratory distress.  Abdominal: Soft. Bowel sounds are normal. He exhibits no distension. There is no tenderness. There is no guarding.  No abdominal tenderness to palpation.  Musculoskeletal: Normal range of motion. He exhibits no edema.  Neurological: He is alert and oriented  to person, place, and time. No cranial nerve deficit or sensory deficit. He exhibits normal muscle tone. Coordination normal.  Pupils reactive. No facial asymmetry noted. Cranial nerves appear grossly intact. Sensation intact to light touch on face, BUE and BLE. Strength 5/5 in BUE and BLE.  Skin: Skin is warm and dry. No rash noted.  No signs of head injury or trauma.  Psychiatric: He has a normal mood and affect.  Nursing note and vitals reviewed.    ED Treatments / Results  Labs (all labs ordered are listed, but only abnormal results are displayed) Labs Reviewed - No data to display  EKG  EKG Interpretation None       Radiology Ct Head Wo Contrast  Result Date: 02/06/2017 CLINICAL DATA:  Severe headache, onset at midnight. EXAM: CT HEAD WITHOUT CONTRAST TECHNIQUE: Contiguous axial images were obtained from the base of the skull through the vertex without intravenous contrast. COMPARISON:  None. FINDINGS: Brain: There is no intracranial hemorrhage, mass or evidence of acute infarction. There is no extra-axial fluid collection. Gray  matter and white matter appear normal. Cerebral volume is normal for age. Brainstem and posterior fossa are unremarkable. The CSF spaces appear normal. Vascular: No hyperdense vessel or unexpected calcification. Skull: Normal. Negative for fracture or focal lesion. Sinuses/Orbits: No acute finding. Other: None. IMPRESSION: Normal brain Electronically Signed   By: Andreas Newport M.D.   On: 02/06/2017 06:44    Procedures Procedures (including critical care time)  Medications Ordered in ED Medications  prochlorperazine (COMPAZINE) injection 10 mg (10 mg Intravenous Given 02/06/17 0732)  diphenhydrAMINE (BENADRYL) injection 12.5 mg (12.5 mg Intravenous Given 02/06/17 0732)  sodium chloride 0.9 % bolus 1,000 mL (1,000 mLs Intravenous New Bag/Given 02/06/17 0732)     Initial Impression / Assessment and Plan / ED Course  I have reviewed the triage vital signs and the nursing notes.  Pertinent labs & imaging results that were available during my care of the patient were reviewed by me and considered in my medical decision making (see chart for details).     Patient presents to ED for evaluation of headache that began approximately 36 hours ago. He does have a history of alcohol and polysubstance abuse. He denies any head injuries or falls but reports some nausea but no vomiting. No focal findings on neurological exam. No signs of head injury or trauma. No abdominal tenderness to palpation. Patient does appear uncomfortable. Vital signs normal. Head CT returned as unremarkable. Patient given Compazine, Benadryl and fluids with complete resolution of his symptoms. He is tolerating by mouth intake here in the ED. No vomiting noted. Patient advised to return to ED for any severe or worsening symptoms.  Final Clinical Impressions(s) / ED Diagnoses   Final diagnoses:  Tension-type headache, not intractable, unspecified chronicity pattern    New Prescriptions New Prescriptions   No medications on file       Delia Heady, PA-C 02/06/17 0818    Daleen Bo, MD 02/06/17 1334

## 2017-02-06 NOTE — Discharge Instructions (Signed)
Please read attached information regarding your condition. Increase fluid and food intake. Continue home medications as previously prescribed. Return to ED for worsening headache, vision changes, head injury, falls, numbness, weakness, trouble walking.

## 2017-02-06 NOTE — ED Triage Notes (Addendum)
Pt states that a headache woke him up tonight. He also reports nausea since the ambulance ride. Pt has no unilateral weakness, facial droop, or vision changes. He endorses drinking 2 40's today, but denies drug use. Pt was seen at [facility] last night and has an extensive substance abuse history. A&Ox4.

## 2017-04-01 ENCOUNTER — Encounter (HOSPITAL_COMMUNITY): Payer: Self-pay | Admitting: Emergency Medicine

## 2017-04-01 DIAGNOSIS — S0300XA Dislocation of jaw, unspecified side, initial encounter: Secondary | ICD-10-CM | POA: Diagnosis not present

## 2017-04-01 DIAGNOSIS — Y929 Unspecified place or not applicable: Secondary | ICD-10-CM | POA: Insufficient documentation

## 2017-04-01 DIAGNOSIS — L84 Corns and callosities: Secondary | ICD-10-CM | POA: Diagnosis not present

## 2017-04-01 DIAGNOSIS — X509XXA Other and unspecified overexertion or strenuous movements or postures, initial encounter: Secondary | ICD-10-CM | POA: Diagnosis not present

## 2017-04-01 DIAGNOSIS — Y999 Unspecified external cause status: Secondary | ICD-10-CM | POA: Diagnosis not present

## 2017-04-01 DIAGNOSIS — I2510 Atherosclerotic heart disease of native coronary artery without angina pectoris: Secondary | ICD-10-CM | POA: Insufficient documentation

## 2017-04-01 DIAGNOSIS — F17210 Nicotine dependence, cigarettes, uncomplicated: Secondary | ICD-10-CM | POA: Diagnosis not present

## 2017-04-01 DIAGNOSIS — Z79899 Other long term (current) drug therapy: Secondary | ICD-10-CM | POA: Diagnosis not present

## 2017-04-01 DIAGNOSIS — Y9389 Activity, other specified: Secondary | ICD-10-CM | POA: Diagnosis not present

## 2017-04-01 DIAGNOSIS — S0993XA Unspecified injury of face, initial encounter: Secondary | ICD-10-CM | POA: Diagnosis present

## 2017-04-01 NOTE — ED Triage Notes (Signed)
C/o lock jaw x 1 hour.  Took someone's muscle relaxer without improvement.  C/o pain to bilateral jaws.

## 2017-04-02 ENCOUNTER — Emergency Department (HOSPITAL_COMMUNITY)
Admission: EM | Admit: 2017-04-02 | Discharge: 2017-04-02 | Disposition: A | Discharge status: Home / Self Care | Payer: Medicaid Other | Attending: Emergency Medicine | Admitting: Emergency Medicine

## 2017-04-02 DIAGNOSIS — S0300XA Dislocation of jaw, unspecified side, initial encounter: Secondary | ICD-10-CM

## 2017-04-02 DIAGNOSIS — L84 Corns and callosities: Secondary | ICD-10-CM

## 2017-04-02 NOTE — ED Provider Notes (Signed)
[facility] EMERGENCY DEPARTMENT Provider Note   CSN: [id number] Arrival date & time: 04/01/17  2333     History   Chief Complaint Chief Complaint  Patient presents with  . lock jaw    HPI [name] [name] is a 51 y.o. male.  51 yo M with a cc of jaw pain.  Yawning had severe pain and stuck mouth open.  Resolved prior to coming back.  Hx of same.  Now complaining of pain to the right foot.  He has an area of callus to the right inside of the foot.  Going on for many weeks. Has not tried any therapies for it.   The history is provided by the patient.  Illness  This is a new problem. The current episode started 3 to 5 hours ago. The problem occurs constantly. The problem has been resolved. Pertinent negatives include no chest pain, no abdominal pain, no headaches and no shortness of breath. Nothing aggravates the symptoms. Nothing relieves the symptoms. He has tried nothing for the symptoms. The treatment provided no relief.    Past Medical History:  Diagnosis Date  . Abnormal ECG    a. early repolarization  . Arthritis    "my whole left side" (05/03/2014)  . Bipolar disorder ([place])   . Bradycardia    a. asymptomatic  . CAD in native artery    a. Nonobstructive cath 11/2007;  b. Presented with ST elevation - Nonobstructive cath 08/2011  . Chest pain, mid sternal   . Coronary artery disease   . GERD (gastroesophageal reflux disease)   . History of cocaine abuse    a. quit ? 2009  . History of ETOH abuse    a. drinks 2 "40's" / wk  . Marijuana abuse    a. uses ~ 1x /wk or less  . Pneumonia   . Stomach ulcer   . Syncope    a. 12/2010 - presumed to be vasovagal  . Tobacco abuse     Patient Active Problem List   Diagnosis Date Noted  . Major depressive disorder, recurrent episode, severe with anxious distress ([place]) 05/23/2016  . PTSD (post-traumatic stress disorder) 05/23/2016  . Cocaine use disorder, moderate, in early remission ([name]) 05/23/2016  .  Alcohol use disorder, moderate, dependence ([place]) 05/23/2016  . Affective psychosis, bipolar ([place]) 04/30/2016  . Cocaine abuse with cocaine-induced mood disorder ([place]) 03/04/2016  . Alcohol dependence with uncomplicated withdrawal ([place])   . Alcohol use disorder, severe, dependence ([place]) 01/18/2015  . Alcohol dependence with alcohol-induced mood disorder ([place])   . Suicidal ideation   . Acute pancreatitis 03/20/2012  . Polysubstance abuse ([place]) 03/20/2012  . Cocaine abuse ([place]) 01/12/2012  . Cannabis abuse 01/12/2012  . Alcohol dependence ([place]) 10/15/2011  . Abnormal ECG   . Chest pain   . CAD in native artery   . GERD (gastroesophageal reflux disease)   . Tobacco abuse   . History of cocaine abuse   . History of ETOH abuse   . Bradycardia   . Syncope     Past Surgical History:  Procedure Laterality Date  . CARDIAC CATHETERIZATION  2009; 08/2011   [name] 08/06/2011  . LEFT HEART CATHETERIZATION WITH CORONARY ANGIOGRAM N/A 08/16/2011   Procedure: LEFT HEART CATHETERIZATION WITH CORONARY ANGIOGRAM;  Surgeon: [name], MD;  Location: [facility] CATH LAB;  Service: Cardiovascular;  Laterality: N/A;       Home Medications    Prior to Admission medications   Medication Sig  Start Date End Date Taking? Authorizing Provider  gabapentin (NEURONTIN) 300 MG capsule Take 1 capsule (300 mg total) by mouth 3 (three) times daily. 12/16/16   [name], NP  nicotine (NICODERM CQ - DOSED IN MG/24 HOURS) 21 mg/24hr patch Place 1 patch (21 mg total) onto the skin daily. Patient not taking: Reported on 08/28/2016 05/23/16   Derrill Center, NP  omeprazole (PRILOSEC) 20 MG capsule Take 1 capsule (20 mg total) by mouth daily. Patient not taking: Reported on 10/08/2016 09/21/16   Larene Pickett, PA-C  ondansetron (ZOFRAN ODT) 4 MG disintegrating tablet Take 1 tablet (4 mg total) by mouth every 8 (eight) hours as needed for nausea or vomiting. Patient not taking: Reported on 10/08/2016 08/28/16    Junius Creamer, NP  oxyCODONE (ROXICODONE) 5 MG immediate release tablet Take 1 tablet (5 mg total) by mouth every 4 (four) hours as needed for severe pain. Patient not taking: Reported on 10/08/2016 08/28/16   Junius Creamer, NP  sertraline (ZOLOFT) 50 MG tablet Take 1 tablet (50 mg total) by mouth daily. 12/16/16   [name], NP  sucralfate (CARAFATE) 1 g tablet Take 1 tablet (1 g total) by mouth 4 (four) times daily -  with meals and at bedtime. Patient not taking: Reported on 10/08/2016 09/21/16   Larene Pickett, PA-C  traZODone (DESYREL) 50 MG tablet Take 1 tablet (50 mg total) by mouth at bedtime as needed for sleep. 12/16/16   [name], NP    Family History No family history on file.  Social History Social History  Substance Use Topics  . Smoking status: Current Some Day Smoker    Packs/day: 0.50    Years: 20.00    Types: Cigarettes    Last attempt to quit: 06/24/2015  . Smokeless tobacco: Never Used  . Alcohol use 28.8 oz/week    48 Cans of beer per week     Comment: 2 40 oz beers per day     Allergies   Aspirin; Pepperoni [pickled meat]; Tomato; and Tylenol [acetaminophen]   Review of Systems Review of Systems  Constitutional: Negative for chills and fever.  HENT: Negative for congestion and facial swelling.        Jaw pain  Eyes: Negative for discharge and visual disturbance.  Respiratory: Negative for shortness of breath.   Cardiovascular: Negative for chest pain and palpitations.  Gastrointestinal: Negative for abdominal pain, diarrhea and vomiting.  Musculoskeletal: Negative for arthralgias and myalgias.  Skin: Negative for color change and rash.  Neurological: Negative for tremors, syncope and headaches.  Psychiatric/Behavioral: Negative for confusion and dysphoric mood.     Physical Exam Updated Vital Signs BP (!) 97/54 (BP Location: Left Arm)   Pulse (!) 55   Temp 98.3 F (36.8 C) (Oral)   Resp 16   SpO2 98%   Physical Exam  Constitutional: He  is oriented to person, place, and time. He appears well-developed and well-nourished.  HENT:  Head: Normocephalic and atraumatic.  Eyes: Pupils are equal, round, and reactive to light. EOM are normal.  Neck: Normal range of motion. Neck supple. No JVD present.  Cardiovascular: Normal rate and regular rhythm.  Exam reveals no gallop and no friction rub.   No murmur heard. Pulmonary/Chest: No respiratory distress. He has no wheezes.  Abdominal: He exhibits no distension and no mass. There is no tenderness. There is no rebound and no guarding.  Musculoskeletal: Normal range of motion.  Callous to the right MTP.  Mild pain.   Neurological: He is alert and oriented to person, place, and time.  Skin: No rash noted. No pallor.  Psychiatric: He has a normal mood and affect. His behavior is normal.  Nursing note and vitals reviewed.    ED Treatments / Results  Labs (all labs ordered are listed, but only abnormal results are displayed) Labs Reviewed - No data to display  EKG  EKG Interpretation None       Radiology No results found.  Procedures Procedures (including critical care time)  Medications Ordered in ED Medications - No data to display   Initial Impression / Assessment and Plan / ED Course  I have reviewed the triage vital signs and the nursing notes.  Pertinent labs & imaging results that were available during my care of the patient were reviewed by me and considered in my medical decision making (see chart for details).     51 yo M with a jaw dislocation.  Resolved prior to arrival.  Now complaining of callous to the foot.  I discussed over-the-counter remedies.  Given podiatry follow-up if needed.  3:32 AM:  I have discussed the diagnosis/risks/treatment options with the patient and believe the pt to be eligible for discharge home to follow-up with PCP. We also discussed returning to the ED immediately if new or worsening sx occur. We discussed the sx which are most  concerning (e.g., sudden worsening pain, fever, inability to tolerate by mouth) that necessitate immediate return. Medications administered to the patient during their visit and any new prescriptions provided to the patient are listed below.  Medications given during this visit Medications - No data to display   The patient appears reasonably screen and/or stabilized for discharge and I doubt any other medical condition or other [facility] requiring further screening, evaluation, or treatment in the ED at this time prior to discharge.    Final Clinical Impressions(s) / ED Diagnoses   Final diagnoses:  Jaw dislocation, initial encounter  Pre-ulcerative corn or callous    New Prescriptions Discharge Medication List as of 04/02/2017  3:13 AM       Deno Etienne, DO 04/02/17 [id number]

## 2017-04-02 NOTE — ED Notes (Signed)
Pt states all symptoms have resolved with lock jaw however he wants a corn that is painful on his left foot to be evaluated.

## 2017-04-10 ENCOUNTER — Emergency Department (HOSPITAL_COMMUNITY)
Admission: EM | Admit: 2017-04-10 | Discharge: 2017-04-10 | Disposition: A | Discharge status: Home / Self Care | Payer: Medicaid Other

## 2017-04-10 DIAGNOSIS — F17210 Nicotine dependence, cigarettes, uncomplicated: Secondary | ICD-10-CM | POA: Insufficient documentation

## 2017-04-10 DIAGNOSIS — F1410 Cocaine abuse, uncomplicated: Secondary | ICD-10-CM | POA: Diagnosis not present

## 2017-04-10 DIAGNOSIS — F1024 Alcohol dependence with alcohol-induced mood disorder: Secondary | ICD-10-CM | POA: Insufficient documentation

## 2017-04-10 DIAGNOSIS — F315 Bipolar disorder, current episode depressed, severe, with psychotic features: Secondary | ICD-10-CM | POA: Insufficient documentation

## 2017-04-10 DIAGNOSIS — R51 Headache: Secondary | ICD-10-CM | POA: Diagnosis not present

## 2017-04-10 DIAGNOSIS — R45851 Suicidal ideations: Secondary | ICD-10-CM | POA: Diagnosis not present

## 2017-04-10 DIAGNOSIS — Z79899 Other long term (current) drug therapy: Secondary | ICD-10-CM | POA: Insufficient documentation

## 2017-04-10 DIAGNOSIS — R45850 Homicidal ideations: Secondary | ICD-10-CM | POA: Diagnosis not present

## 2017-04-10 DIAGNOSIS — I2510 Atherosclerotic heart disease of native coronary artery without angina pectoris: Secondary | ICD-10-CM | POA: Diagnosis not present

## 2017-04-10 DIAGNOSIS — F329 Major depressive disorder, single episode, unspecified: Secondary | ICD-10-CM | POA: Diagnosis present

## 2017-04-10 NOTE — ED Notes (Signed)
No Police or pt in lobby answering when called for triage

## 2017-04-10 NOTE — ED Notes (Signed)
Pt called for v/s no response from lobby 

## 2017-04-10 NOTE — ED Notes (Signed)
Pt called for triage, no response from lobby 

## 2017-04-11 ENCOUNTER — Emergency Department (HOSPITAL_COMMUNITY)
Admission: EM | Admit: 2017-04-11 | Discharge: 2017-04-11 | Disposition: A | Discharge status: Home / Self Care | Payer: Medicaid Other | Attending: Emergency Medicine | Admitting: Emergency Medicine

## 2017-04-11 ENCOUNTER — Encounter (HOSPITAL_COMMUNITY): Payer: Self-pay

## 2017-04-11 ENCOUNTER — Emergency Department (HOSPITAL_COMMUNITY): Payer: Medicaid Other

## 2017-04-11 DIAGNOSIS — F1024 Alcohol dependence with alcohol-induced mood disorder: Secondary | ICD-10-CM | POA: Diagnosis present

## 2017-04-11 DIAGNOSIS — R45850 Homicidal ideations: Secondary | ICD-10-CM

## 2017-04-11 DIAGNOSIS — R45851 Suicidal ideations: Secondary | ICD-10-CM

## 2017-04-11 DIAGNOSIS — F1410 Cocaine abuse, uncomplicated: Secondary | ICD-10-CM | POA: Diagnosis present

## 2017-04-11 DIAGNOSIS — F315 Bipolar disorder, current episode depressed, severe, with psychotic features: Secondary | ICD-10-CM

## 2017-04-11 LAB — COMPREHENSIVE METABOLIC PANEL
ALBUMIN: 4 g/dL (ref 3.5–5.0)
ALT: 16 U/L — AB (ref 17–63)
AST: 28 U/L (ref 15–41)
Alkaline Phosphatase: 76 U/L (ref 38–126)
Anion gap: 12 (ref 5–15)
BUN: 8 mg/dL (ref 6–20)
CHLORIDE: 107 mmol/L (ref 101–111)
CO2: 22 mmol/L (ref 22–32)
CREATININE: 0.97 mg/dL (ref 0.61–1.24)
Calcium: 8.8 mg/dL — ABNORMAL LOW (ref 8.9–10.3)
GFR calc Af Amer: >60 mL/min (ref >60)
GFR calc non Af Amer: >60 mL/min (ref >60)
GLUCOSE: 89 mg/dL (ref 65–99)
POTASSIUM: 3.7 mmol/L (ref 3.5–5.1)
SODIUM: 141 mmol/L (ref 135–145)
Total Bilirubin: 0.7 mg/dL (ref 0.3–1.2)
Total Protein: 7.6 g/dL (ref 6.5–8.1)

## 2017-04-11 LAB — CBC
HEMATOCRIT: 38.2 % — AB (ref 39.0–52.0)
HEMOGLOBIN: 13.6 g/dL (ref 13.0–17.0)
MCH: 31.5 pg (ref 26.0–34.0)
MCHC: 35.6 g/dL (ref 30.0–36.0)
MCV: 88.4 fL (ref 78.0–100.0)
PLATELETS: 284 10*3/uL (ref 150–400)
RBC: 4.32 MIL/uL (ref 4.22–5.81)
RDW: 13 % (ref 11.5–15.5)
WBC: 5.9 10*3/uL (ref 4.0–10.5)

## 2017-04-11 LAB — RAPID URINE DRUG SCREEN, HOSP PERFORMED
AMPHETAMINES: NOT DETECTED
BENZODIAZEPINES: NOT DETECTED
Barbiturates: NOT DETECTED
Cocaine: POSITIVE — AB
Opiates: NOT DETECTED
TETRAHYDROCANNABINOL: POSITIVE — AB

## 2017-04-11 LAB — SALICYLATE LEVEL: Salicylate Lvl: <7 mg/dL (ref 2.8–30.0)

## 2017-04-11 LAB — ETHANOL: ALCOHOL ETHYL (B): 92 mg/dL — AB (ref <10)

## 2017-04-11 LAB — ACETAMINOPHEN LEVEL: Acetaminophen (Tylenol), Serum: <10 ug/mL — ABNORMAL LOW (ref 10–30)

## 2017-04-11 MED ORDER — TRAZODONE HCL 50 MG PO TABS: 50.0000 mg | ORAL_TABLET | Freq: Every evening | ORAL | 0 refills | Status: DC | PRN

## 2017-04-11 MED ORDER — SERTRALINE HCL 50 MG PO TABS: 50.0000 mg | ORAL_TABLET | Freq: Every day | ORAL | 0 refills | Status: DC

## 2017-04-11 MED ORDER — PANTOPRAZOLE SODIUM 40 MG PO TBEC
40.0000 mg | DELAYED_RELEASE_TABLET | Freq: Every day | ORAL | Status: DC
  Administered 2017-04-11: 40 mg via ORAL
  Filled 2017-04-11: qty 1

## 2017-04-11 MED ORDER — TRAZODONE HCL 50 MG PO TABS: 50.0000 mg | ORAL_TABLET | Freq: Every evening | ORAL | Status: DC | PRN

## 2017-04-11 MED ORDER — GABAPENTIN 300 MG PO CAPS: 300.0000 mg | ORAL_CAPSULE | Freq: Three times a day (TID) | ORAL | 0 refills | Status: DC

## 2017-04-11 MED ORDER — GABAPENTIN 300 MG PO CAPS
300.0000 mg | ORAL_CAPSULE | Freq: Three times a day (TID) | ORAL | Status: DC
  Administered 2017-04-11: 300 mg via ORAL
  Filled 2017-04-11: qty 1

## 2017-04-11 MED ORDER — ONDANSETRON HCL 4 MG PO TABS: 4.0000 mg | ORAL_TABLET | Freq: Three times a day (TID) | ORAL | Status: DC | PRN

## 2017-04-11 MED ORDER — SERTRALINE HCL 50 MG PO TABS
50.0000 mg | ORAL_TABLET | Freq: Every day | ORAL | Status: DC
Start: 2017-04-11 — End: 2017-04-11
  Administered 2017-04-11: 50 mg via ORAL
  Filled 2017-04-11: qty 1

## 2017-04-11 MED ORDER — ALUM & MAG HYDROXIDE-SIMETH 200-200-20 MG/5ML PO SUSP: 30.0000 mL | Freq: Four times a day (QID) | ORAL | Status: DC | PRN

## 2017-04-11 MED ORDER — NICOTINE 7 MG/24HR TD PT24: 7.0000 mg | MEDICATED_PATCH | Freq: Every day | TRANSDERMAL | Status: DC

## 2017-04-11 MED ORDER — ACETAMINOPHEN 325 MG PO TABS
650.0000 mg | ORAL_TABLET | ORAL | Status: DC | PRN
  Administered 2017-04-11: 650 mg via ORAL
  Filled 2017-04-11: qty 2

## 2017-04-11 NOTE — ED Triage Notes (Signed)
States not taking psych medication instead he states he was drinking and doing drugs no states SI and HI. And left eye.

## 2017-04-11 NOTE — ED Notes (Signed)
Pt. Transferred to [facility] from ED to room 37 after screening for contraband. Report to include Situation, Background, Assessment and Recommendations from [name]. Pt. Oriented to unit including Q15 minute rounds as well as the security cameras for their protection. Patient is alert and oriented, warm and dry in no acute distress. Patient denies HI, and VH. Pt. States he has SI  And hears voices without command. Pt. Encouraged to let me know if needs arise.

## 2017-04-11 NOTE — ED Notes (Signed)
No respiratory or acute distress noted resting in bed with eyes closed call light in reach. 

## 2017-04-11 NOTE — ED Notes (Signed)
Hourly rounding reveals patient sleeping in room. No complaints, stable, in no acute distress. Q15 minute rounds and monitoring via Security Cameras to continue. 

## 2017-04-11 NOTE — ED Notes (Signed)
Pt. Talking to TTS on camera.

## 2017-04-11 NOTE — ED Notes (Signed)
Bed: [facility] Expected date:  Expected time:  Means of arrival:  Comments: Tr 1

## 2017-04-11 NOTE — ED Notes (Signed)
Pt discharged home. Discharged instructions read to pt who verbalized understanding. All belongings returned to pt who signed for same. Denies SI/HI, is not delusional and not responding to internal stimuli. Escorted pt to the ED exit.  Pt given bus pass. 
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
## 2017-04-11 NOTE — ED Notes (Signed)
Bed: [facility]PT1 Expected date:  Expected time:  Means of arrival:  Comments: 

## 2017-04-11 NOTE — BH Specialist Note (Signed)
Per Waylan Boga, DNP, patient does not meet criteria for INPT treatment. Patient referred to peer support for follow up referrals to local substance abuse facilities such as [facility]. Patient also provided with mental health resources.

## 2017-04-11 NOTE — ED Notes (Signed)
Seen and wand by security.

## 2017-09-09 ENCOUNTER — Emergency Department (HOSPITAL_COMMUNITY): Payer: Medicaid Other

## 2017-09-09 ENCOUNTER — Emergency Department (HOSPITAL_COMMUNITY)
Admission: EM | Admit: 2017-09-09 | Discharge: 2017-09-09 | Disposition: A | Discharge status: Home / Self Care | Payer: Medicaid Other | Attending: Emergency Medicine | Admitting: Emergency Medicine

## 2017-09-09 ENCOUNTER — Other Ambulatory Visit: Payer: Self-pay

## 2017-09-09 ENCOUNTER — Encounter (HOSPITAL_COMMUNITY): Payer: Self-pay | Admitting: Emergency Medicine

## 2017-09-09 DIAGNOSIS — F319 Bipolar disorder, unspecified: Secondary | ICD-10-CM | POA: Insufficient documentation

## 2017-09-09 DIAGNOSIS — F17210 Nicotine dependence, cigarettes, uncomplicated: Secondary | ICD-10-CM | POA: Insufficient documentation

## 2017-09-09 DIAGNOSIS — Z79899 Other long term (current) drug therapy: Secondary | ICD-10-CM | POA: Diagnosis not present

## 2017-09-09 DIAGNOSIS — J209 Acute bronchitis, unspecified: Secondary | ICD-10-CM | POA: Diagnosis not present

## 2017-09-09 DIAGNOSIS — I2510 Atherosclerotic heart disease of native coronary artery without angina pectoris: Secondary | ICD-10-CM | POA: Diagnosis not present

## 2017-09-09 DIAGNOSIS — Y907 Blood alcohol level of 200-239 mg/100 ml: Secondary | ICD-10-CM | POA: Diagnosis not present

## 2017-09-09 DIAGNOSIS — F1410 Cocaine abuse, uncomplicated: Secondary | ICD-10-CM | POA: Diagnosis not present

## 2017-09-09 DIAGNOSIS — R042 Hemoptysis: Secondary | ICD-10-CM | POA: Diagnosis present

## 2017-09-09 DIAGNOSIS — F10920 Alcohol use, unspecified with intoxication, uncomplicated: Secondary | ICD-10-CM

## 2017-09-09 LAB — CBC WITH DIFFERENTIAL/PLATELET
BASOS ABS: 0 10*3/uL (ref 0.0–0.1)
BASOS PCT: 0 %
Eosinophils Absolute: 0.2 10*3/uL (ref 0.0–0.7)
Eosinophils Relative: 2 %
HCT: 39.6 % (ref 39.0–52.0)
HEMOGLOBIN: 13.7 g/dL (ref 13.0–17.0)
Lymphocytes Relative: 42 %
Lymphs Abs: 3.1 10*3/uL (ref 0.7–4.0)
MCH: 31.3 pg (ref 26.0–34.0)
MCHC: 34.6 g/dL (ref 30.0–36.0)
MCV: 90.4 fL (ref 78.0–100.0)
MONOS PCT: 8 %
Monocytes Absolute: 0.6 10*3/uL (ref 0.1–1.0)
NEUTROS PCT: 48 %
Neutro Abs: 3.5 10*3/uL (ref 1.7–7.7)
Platelets: 259 10*3/uL (ref 150–400)
RBC: 4.38 MIL/uL (ref 4.22–5.81)
RDW: 13.2 % (ref 11.5–15.5)
WBC: 7.4 10*3/uL (ref 4.0–10.5)

## 2017-09-09 LAB — COMPREHENSIVE METABOLIC PANEL
ALBUMIN: 3.9 g/dL (ref 3.5–5.0)
ALT: 18 U/L (ref 17–63)
AST: 28 U/L (ref 15–41)
Alkaline Phosphatase: 74 U/L (ref 38–126)
Anion gap: 10 (ref 5–15)
BUN: 9 mg/dL (ref 6–20)
CHLORIDE: 106 mmol/L (ref 101–111)
CO2: 24 mmol/L (ref 22–32)
Calcium: 8.7 mg/dL — ABNORMAL LOW (ref 8.9–10.3)
Creatinine, Ser: 0.97 mg/dL (ref 0.61–1.24)
GFR calc Af Amer: >60 mL/min (ref >60)
GFR calc non Af Amer: >60 mL/min (ref >60)
GLUCOSE: 83 mg/dL (ref 65–99)
POTASSIUM: 3.7 mmol/L (ref 3.5–5.1)
Sodium: 140 mmol/L (ref 135–145)
Total Bilirubin: 0.5 mg/dL (ref 0.3–1.2)
Total Protein: 7.4 g/dL (ref 6.5–8.1)

## 2017-09-09 LAB — URINALYSIS, ROUTINE W REFLEX MICROSCOPIC
BILIRUBIN URINE: NEGATIVE
Bacteria, UA: NONE SEEN
Glucose, UA: NEGATIVE mg/dL
Ketones, ur: NEGATIVE mg/dL
Leukocytes, UA: NEGATIVE
NITRITE: NEGATIVE
PH: 6 (ref 5.0–8.0)
Protein, ur: NEGATIVE mg/dL
RBC / HPF: NONE SEEN RBC/hpf (ref 0–5)
SPECIFIC GRAVITY, URINE: 1.002 — AB (ref 1.005–1.030)
Squamous Epithelial / LPF: NONE SEEN

## 2017-09-09 LAB — RAPID URINE DRUG SCREEN, HOSP PERFORMED
Amphetamines: NOT DETECTED
Barbiturates: NOT DETECTED
Benzodiazepines: NOT DETECTED
Cocaine: POSITIVE — AB
Opiates: NOT DETECTED
TETRAHYDROCANNABINOL: POSITIVE — AB

## 2017-09-09 LAB — LIPASE, BLOOD: Lipase: 40 U/L (ref 11–51)

## 2017-09-09 LAB — ETHANOL: Alcohol, Ethyl (B): 200 mg/dL — ABNORMAL HIGH (ref <10)

## 2017-09-09 MED ORDER — IPRATROPIUM-ALBUTEROL 0.5-2.5 (3) MG/3ML IN SOLN
3.0000 mL | Freq: Once | RESPIRATORY_TRACT | Status: AC
Start: 2017-09-09 — End: 2017-09-09
  Administered 2017-09-09: 3 mL via RESPIRATORY_TRACT
  Filled 2017-09-09: qty 3

## 2017-09-09 MED ORDER — DOXYCYCLINE HYCLATE 100 MG PO CAPS: 100.0000 mg | ORAL_CAPSULE | Freq: Two times a day (BID) | ORAL | 0 refills | Status: DC

## 2017-09-09 MED ORDER — ALBUTEROL SULFATE HFA 108 (90 BASE) MCG/ACT IN AERS: 2.0000 | INHALATION_SPRAY | RESPIRATORY_TRACT | 0 refills | Status: DC | PRN

## 2017-09-09 NOTE — ED Provider Notes (Signed)
[facility] DEPT Provider Note   CSN: [id number] Arrival date & time: 09/09/17  0054     History   Chief Complaint Chief Complaint  Patient presents with  . Hematuria  . Hemoptysis    HPI [name] [name] is a 52 y.o. male.  The history is provided by the patient.  He has a history of bipolar disorder, coronary artery disease, alcohol abuse, cocaine abuse and comes in with productive cough for the last 2 weeks.  Cough is productive of green sputum and, today, he noted some streaks of blood in the sputum.  He denies dyspnea, but cough sometimes keeps him awake at night.  He denies chest pain, heaviness, tightness, pressure.  He denies fever or chills.  2 days ago, he noted some blood in his stool, but he has had bowel movement since then without any blood.  He does admit to drinking heavily and using cocaine and marijuana.  He also admits to smoking about half pack of cigarettes a day.  Past Medical History:  Diagnosis Date  . Abnormal ECG    a. early repolarization  . Arthritis    "my whole left side" (05/03/2014)  . Bipolar disorder ([place])   . Bradycardia    a. asymptomatic  . CAD in native artery    a. Nonobstructive cath 11/2007;  b. Presented with ST elevation - Nonobstructive cath 08/2011  . Chest pain, mid sternal   . Coronary artery disease   . GERD (gastroesophageal reflux disease)   . History of cocaine abuse    a. quit ? 2009  . History of ETOH abuse    a. drinks 2 "40's" / wk  . Marijuana abuse    a. uses ~ 1x /wk or less  . Pneumonia   . Stomach ulcer   . Syncope    a. 12/2010 - presumed to be vasovagal  . Tobacco abuse     Patient Active Problem List   Diagnosis Date Noted  . Major depressive disorder, recurrent episode, severe with anxious distress ([place]) 05/23/2016  . PTSD (post-traumatic stress disorder) 05/23/2016  . Cocaine use disorder, moderate, in early remission ([place]) 05/23/2016  . Alcohol use disorder, moderate,  dependence ([place]) 05/23/2016  . Affective psychosis, bipolar ([place]) 04/30/2016  . Cocaine abuse with cocaine-induced mood disorder ([place]) 03/04/2016  . Alcohol dependence with uncomplicated withdrawal ([place])   . Alcohol use disorder, severe, dependence ([place]) 01/18/2015  . Alcohol dependence with alcohol-induced mood disorder ([place])   . Suicidal ideation   . Acute pancreatitis 03/20/2012  . Polysubstance abuse ([place]) 03/20/2012  . Cocaine abuse ([place]) 01/12/2012  . Cannabis abuse 01/12/2012  . Alcohol dependence ([place]) 10/15/2011  . Abnormal ECG   . Chest pain   . CAD in native artery   . GERD (gastroesophageal reflux disease)   . Tobacco abuse   . History of cocaine abuse   . History of ETOH abuse   . Bradycardia   . Syncope     Past Surgical History:  Procedure Laterality Date  . CARDIAC CATHETERIZATION  2009; 08/2011   [name] 08/06/2011  . LEFT HEART CATHETERIZATION WITH CORONARY ANGIOGRAM N/A 08/16/2011   Procedure: LEFT HEART CATHETERIZATION WITH CORONARY ANGIOGRAM;  Surgeon: [name], MD;  Location[facility] CATH LAB;  Service: Cardiovascular;  Laterality: N/A;        Home Medications    Prior to Admission medications   Medication Sig Start Date End Date Taking? Authorizing Provider  gabapentin (NEURONTIN) 300  MG capsule Take 1 capsule (300 mg total) 3 (three) times daily by mouth. 04/11/17   [name], NP  nicotine (NICODERM CQ - DOSED IN MG/24 HOURS) 21 mg/24hr patch Place 1 patch (21 mg total) onto the skin daily. Patient not taking: Reported on 08/28/2016 05/23/16   Derrill Center, NP  ondansetron (ZOFRAN ODT) 4 MG disintegrating tablet Take 1 tablet (4 mg total) by mouth every 8 (eight) hours as needed for nausea or vomiting. Patient not taking: Reported on 10/08/2016 08/28/16   Junius Creamer, NP  sertraline (ZOLOFT) 50 MG tablet Take 1 tablet (50 mg total) daily by mouth. 04/12/17   [name], NP  sucralfate (CARAFATE) 1 g tablet Take 1 tablet (1 g total) by  mouth 4 (four) times daily -  with meals and at bedtime. Patient not taking: Reported on 10/08/2016 09/21/16   Larene Pickett, PA-C  traZODone (DESYREL) 50 MG tablet Take 1 tablet (50 mg total) at bedtime as needed by mouth for sleep. 04/11/17   [name], NP    Family History No family history on file.  Social History Social History   Tobacco Use  . Smoking status: Current Some Day Smoker    Packs/day: 0.50    Years: 20.00    Pack years: 10.00    Types: Cigarettes    Last attempt to quit: 06/24/2015    Years since quitting: 2.2  . Smokeless tobacco: Never Used  Substance Use Topics  . Alcohol use: Yes    Alcohol/week: 28.8 oz    Types: 48 Cans of beer per week    Comment: 2 40 oz beers per day  . Drug use: Yes    Types: Marijuana, Cocaine     Allergies   Aspirin; Pepperoni [pickled meat]; Tomato; and Tylenol [acetaminophen]   Review of Systems Review of Systems  All other systems reviewed and are negative.    Physical Exam Updated Vital Signs BP 103/64   Pulse 62   Temp 97.8 F (36.6 C) (Oral)   Resp 19   SpO2 95%   Physical Exam  Nursing note and vitals reviewed.  52 year old male, resting comfortably and in no acute distress. Vital signs are normal. Oxygen saturation is 95%, which is normal. Head is normocephalic and atraumatic. PERRLA, EOMI. Oropharynx is clear.  There is no scleral icterus. Neck is nontender and supple without adenopathy or JVD. Back is nontender and there is no CVA tenderness. Lungs are clear without rales, wheezes, or rhonchi. Chest is nontender. Heart has regular rate and rhythm without murmur. Abdomen is soft, flat, nontender without masses or hepatosplenomegaly and peristalsis is normoactive. Extremities have no cyanosis or edema, full range of motion is present. Skin is warm and dry without rash. Neurologic: Mental status is normal, cranial nerves are intact, there are no motor or sensory deficits.  ED Treatments / Results    Labs (all labs ordered are listed, but only abnormal results are displayed) Labs Reviewed  URINALYSIS, ROUTINE W REFLEX MICROSCOPIC - Abnormal; Notable for the following components:      Result Value   Color, Urine STRAW (*)    Specific Gravity, Urine 1.002 (*)    Hgb urine dipstick MODERATE (*)    All other components within normal limits  RAPID URINE DRUG SCREEN, HOSP PERFORMED - Abnormal; Notable for the following components:   Cocaine POSITIVE (*)    Tetrahydrocannabinol POSITIVE (*)    All other components within normal limits  COMPREHENSIVE METABOLIC PANEL - Abnormal; Notable for the following components:   Calcium 8.7 (*)    All other components within normal limits  ETHANOL - Abnormal; Notable for the following components:   Alcohol, Ethyl (B) 200 (*)    All other components within normal limits  CBC WITH DIFFERENTIAL/PLATELET  LIPASE, BLOOD    EKG EKG Interpretation  Date/Time:  Friday September 09 2017 01:49:44 EDT Ventricular Rate:  57 PR Interval:    QRS Duration: 92 QT Interval:  418 QTC Calculation: 407 R Axis:   56 Text Interpretation:  Sinus rhythm RSR' in V1 or V2, probably normal variant Early repolarization When compared with ECG of 09/21/2016, No significant change was found Confirmed by Delora Fuel ([id number]) on 09/09/2017 1:55:13 AM   Radiology Dg Chest 2 View  Result Date: 09/09/2017 CLINICAL DATA:  Cough for 1 week. Coughing up blood today. Body aches. EXAM: CHEST - 2 VIEW COMPARISON:  09/21/2016 FINDINGS: Mild hyperinflation. Central interstitial changes suggesting acute or chronic bronchitis. No airspace disease or consolidation. No blunting of costophrenic angles. No pneumothorax. Azygos lobe. Mediastinal contours appear intact. Heart size and pulmonary vascularity are normal. IMPRESSION: Mild hyperinflation and bronchitic changes in the lungs. No focal consolidation. Electronically Signed   By: Lucienne Capers M.D.   On: 09/09/2017 02:45     Procedures Procedures (including critical care time)  Medications Ordered in ED Medications  ipratropium-albuterol (DUONEB) 0.5-2.5 (3) MG/3ML nebulizer solution 3 mL (3 mLs Nebulization Given 09/09/17 0139)     Initial Impression / Assessment and Plan / ED Course  I have reviewed the triage vital signs and the nursing notes.  Pertinent labs & imaging results that were available during my care of the patient were reviewed by me and considered in my medical decision making (see chart for details).  Respiratory tract infection-bronchitis versus pneumonia.  He will be sent for chest x-ray.  Of note, triage nursing note states scleral icterus present.  Scleral icterus is not present.  Single episode of blood in stool 2 days ago does not require additional evaluation at this time.  Old records are reviewed, and he has multiple ED visits for substance abuse.  Chest x-ray shows no pneumonia or nodules.  Hemoglobin is stable-no evidence of significant bleeding.  Liver function is normal.  Drug screen is positive for cocaine and marijuana.  Ethanol level is significantly elevated.  He had significant improvement with nebulizer treatment with albuterol and ipratropium.  He is advised to abstain from alcohol, tobacco, and illicit drugs.  He is given prescriptions for doxycycline and albuterol inhaler.  Also given resource guide for substance abuse treatment.  Final Clinical Impressions(s) / ED Diagnoses   Final diagnoses:  Acute bronchitis, unspecified organism  Alcohol intoxication, uncomplicated ([place])  Cocaine abuse [facility])    ED Discharge Orders        Ordered    albuterol (PROVENTIL HFA;VENTOLIN HFA) 108 (90 Base) MCG/ACT inhaler  Every 4 hours PRN     09/09/17 0320    doxycycline (VIBRAMYCIN) 100 MG capsule  2 times daily     93/73/42 8768       Delora Fuel, MD 11/57/26 [phone]

## 2017-09-09 NOTE — ED Notes (Signed)
Pt has a urinal 

## 2017-09-09 NOTE — Discharge Instructions (Addendum)
Do not smoke, drink, or use drugs.

## 2017-09-09 NOTE — ED Triage Notes (Signed)
Pt arriving with [place] admitting to doing cocaine, marijuana, and drinking tonight. Pt coughing up blood and has blood in urine. Pt reports he has had ulcers before. Eyes are jaundiced.

## 2017-09-09 NOTE — ED Notes (Signed)
Bed: WA09 Expected date:  Expected time:  Means of arrival:  Comments: EMS 52 yo male blood in urine and emesis

## 2017-09-17 ENCOUNTER — Emergency Department (HOSPITAL_COMMUNITY): Payer: Medicaid Other

## 2017-09-17 ENCOUNTER — Other Ambulatory Visit: Payer: Self-pay

## 2017-09-17 ENCOUNTER — Encounter (HOSPITAL_COMMUNITY): Payer: Self-pay | Admitting: Emergency Medicine

## 2017-09-17 ENCOUNTER — Emergency Department (HOSPITAL_COMMUNITY)
Admission: EM | Admit: 2017-09-17 | Discharge: 2017-09-17 | Disposition: A | Discharge status: Home / Self Care | Payer: Medicaid Other | Attending: Emergency Medicine | Admitting: Emergency Medicine

## 2017-09-17 DIAGNOSIS — I2510 Atherosclerotic heart disease of native coronary artery without angina pectoris: Secondary | ICD-10-CM | POA: Diagnosis not present

## 2017-09-17 DIAGNOSIS — F17210 Nicotine dependence, cigarettes, uncomplicated: Secondary | ICD-10-CM | POA: Diagnosis not present

## 2017-09-17 DIAGNOSIS — Y9389 Activity, other specified: Secondary | ICD-10-CM | POA: Insufficient documentation

## 2017-09-17 DIAGNOSIS — S60940A Unspecified superficial injury of right index finger, initial encounter: Secondary | ICD-10-CM | POA: Diagnosis present

## 2017-09-17 DIAGNOSIS — Z955 Presence of coronary angioplasty implant and graft: Secondary | ICD-10-CM | POA: Diagnosis not present

## 2017-09-17 DIAGNOSIS — Z79899 Other long term (current) drug therapy: Secondary | ICD-10-CM | POA: Insufficient documentation

## 2017-09-17 DIAGNOSIS — Y999 Unspecified external cause status: Secondary | ICD-10-CM | POA: Insufficient documentation

## 2017-09-17 DIAGNOSIS — Y929 Unspecified place or not applicable: Secondary | ICD-10-CM | POA: Insufficient documentation

## 2017-09-17 DIAGNOSIS — S63259A Unspecified dislocation of unspecified finger, initial encounter: Secondary | ICD-10-CM

## 2017-09-17 DIAGNOSIS — S63250A Unspecified dislocation of right index finger, initial encounter: Secondary | ICD-10-CM | POA: Insufficient documentation

## 2017-09-17 DIAGNOSIS — W010XXA Fall on same level from slipping, tripping and stumbling without subsequent striking against object, initial encounter: Secondary | ICD-10-CM | POA: Insufficient documentation

## 2017-09-17 MED ORDER — IBUPROFEN 400 MG PO TABS
600.0000 mg | ORAL_TABLET | Freq: Once | ORAL | Status: AC
  Administered 2017-09-17: 600 mg via ORAL
  Filled 2017-09-17: qty 1

## 2017-09-17 NOTE — ED Provider Notes (Signed)
[facility] EMERGENCY DEPARTMENT Provider Note   CSN: [id number] Arrival date & time: 09/17/17  7829     History   Chief Complaint Chief Complaint  Patient presents with  . Finger Injury    HPI [name] [name] is a 52 y.o. male with a history of alcoholism, bipolar, cad, who presents today for evaluation of sudden onset right finger pain.  He reports that he was drinking last night and fell hurting his right index finger.  Reports normal normal normal complaints.  He has not tried anything prior to arrival.  His pain does not radiate or move is localized in his right index finger.  He has been unable to bend his right index finger.  HPI  Past Medical History:  Diagnosis Date  . Abnormal ECG    a. early repolarization  . Arthritis    "my whole left side" (05/03/2014)  . Bipolar disorder ([place])   . Bradycardia    a. asymptomatic  . CAD in native artery    a. Nonobstructive cath 11/2007;  b. Presented with ST elevation - Nonobstructive cath 08/2011  . Chest pain, mid sternal   . Coronary artery disease   . GERD (gastroesophageal reflux disease)   . History of cocaine abuse    a. quit ? 2009  . History of ETOH abuse    a. drinks 2 "40's" / wk  . Marijuana abuse    a. uses ~ 1x /wk or less  . Pneumonia   . Stomach ulcer   . Syncope    a. 12/2010 - presumed to be vasovagal  . Tobacco abuse     Patient Active Problem List   Diagnosis Date Noted  . Major depressive disorder, recurrent episode, severe with anxious distress ([place]) 05/23/2016  . PTSD (post-traumatic stress disorder) 05/23/2016  . Cocaine use disorder, moderate, in early remission ([place]) 05/23/2016  . Alcohol use disorder, moderate, dependence ([place]) 05/23/2016  . Affective psychosis, bipolar ([place]) 04/30/2016  . Cocaine abuse with cocaine-induced mood disorder ([place]) 03/04/2016  . Alcohol dependence with uncomplicated withdrawal ([place])   . Alcohol use disorder, severe, dependence ([place]) 01/18/2015    . Alcohol dependence with alcohol-induced mood disorder ([place])   . Suicidal ideation   . Acute pancreatitis 03/20/2012  . Polysubstance abuse ([place]) 03/20/2012  . Cocaine abuse ([place]) 01/12/2012  . Cannabis abuse 01/12/2012  . Alcohol dependence ([place]) 10/15/2011  . Abnormal ECG   . Chest pain   . CAD in native artery   . GERD (gastroesophageal reflux disease)   . Tobacco abuse   . History of cocaine abuse   . History of ETOH abuse   . Bradycardia   . Syncope     Past Surgical History:  Procedure Laterality Date  . CARDIAC CATHETERIZATION  2009; 08/2011   [name] 08/06/2011  . LEFT HEART CATHETERIZATION WITH CORONARY ANGIOGRAM N/A 08/16/2011   Procedure: LEFT HEART CATHETERIZATION WITH CORONARY ANGIOGRAM;  Surgeon: [name], MD;  [facility] C[facility] LAB;  Service: Cardiovascular;  Laterality: N/A;        Home Medications    Prior to Admission medications   Medication Sig Start Date End Date Taking? Authorizing Provider  albuterol (PROVENTIL HFA;VENTOLIN HFA) 108 (90 Base) MCG/ACT inhaler Inhale 2 puffs into the lungs every 4 (four) hours as needed for wheezing or shortness of breath (or coughing). 10/11/19   Delora Fuel, MD  doxycycline (VIBRAMYCIN) 100 MG capsule Take 1 capsule (100 mg total) by mouth 2 (two)  times daily. 12/09/23   Delora Fuel, MD    Family History No family history on file.  Social History Social History   Tobacco Use  . Smoking status: Current Some Day Smoker    Packs/day: 0.50    Years: 20.00    Pack years: 10.00    Types: Cigarettes    Last attempt to quit: 06/24/2015    Years since quitting: 2.2  . Smokeless tobacco: Never Used  Substance Use Topics  . Alcohol use: Yes    Alcohol/week: 28.8 oz    Types: 48 Cans of beer per week    Comment: 2 40 oz beers per day  . Drug use: Yes    Types: Marijuana, Cocaine     Allergies   Aspirin; Pepperoni [pickled meat]; Tomato; and Tylenol [acetaminophen]   Review of Systems Review of  Systems  Constitutional: Negative for chills and fever.  Musculoskeletal:       Right index finger pain.   Skin: Negative for color change and wound.  Neurological: Negative for weakness and numbness.  Psychiatric/Behavioral: Negative for confusion.  All other systems reviewed and are negative.    Physical Exam Updated Vital Signs BP 137/78 (BP Location: Right Arm)   Pulse 74   Temp 98.8 F (37.1 C) (Oral)   Resp 20   SpO2 99%   Physical Exam  Constitutional: He appears well-developed and well-nourished.  HENT:  Head: Normocephalic and atraumatic.  Cardiovascular:  Right index finger and hand is warm and well perfused.  Brisk capillary refill of right index finger.  Musculoskeletal:  Right index finger is obviously deformed.  The middle phalanx is dorsally elevated compared to the proximal phalanx.  Mild soft tissue swelling surrounding.  Neurological:  Sensation intact to right hand and index finger.  Skin: Skin is warm and dry. He is not diaphoretic.  Skin is intact to right index finger  Nursing note and vitals reviewed.    ED Treatments / Results  Labs (all labs ordered are listed, but only abnormal results are displayed) Labs Reviewed - No data to display  EKG None  Radiology Dg Finger Index Right  Result Date: 09/17/2017 CLINICAL DATA:  Post reduction. EXAM: RIGHT INDEX FINGER 2+V COMPARISON:  Right index finger x-rays from same day. FINDINGS: Interval reduction of the index finger PIP joint dislocation. No acute fracture. Joint spaces are preserved. Bone mineralization is normal. Mild soft tissue swelling surrounding the index finger PIP joint. IMPRESSION: 1. Interval reduction of the index finger PIP joint dislocation. Electronically Signed   By: Titus Dubin M.D.   On: 09/17/2017 09:53   Dg Finger Index Right  Result Date: 09/17/2017 CLINICAL DATA:  Fall. EXAM: RIGHT INDEX FINGER 2+V COMPARISON:  None. FINDINGS: Dorsal dislocation of the index finger  middle phalanx with respect to the proximal phalanx. No definite fracture. Surrounding soft tissue swelling. Bone mineralization is normal. IMPRESSION: 1. Dorsal dislocation of the index finger PIP joint. No definite fracture. Electronically Signed   By: Titus Dubin M.D.   On: 09/17/2017 08:57    Procedures Reduction of dislocation Date/Time: 09/17/2017 9:40 AM Performed by: Lorin Glass, PA-C Authorized by: Lorin Glass, PA-C  Consent: Verbal consent obtained. Consent given by: patient Patient understanding: patient states understanding of the procedure being performed Imaging studies: imaging studies available Patient identity confirmed: verbally with patient Time out: Immediately prior to procedure a "time out" was called to verify the correct patient, procedure, equipment, support staff and site/side marked as required.  Local anesthesia used: no (Patient was given the option for digital block, lidocaine, and declined stating that he "hates needles.")  Anesthesia: Local anesthesia used: no (Patient was given the option for digital block, lidocaine, and declined stating that he "hates needles.") Comments: Patient gave verbal consent.  Assistant was used to help hold limb mobile.  Finger was firmly grasped hyper extended and traction was applied resulting in rapid reduction of injury and rapid improvement in pain.  Postreduction films ordered.  Marland KitchenSplint Application Date/Time: 7/54/4920 9:59 AM Performed by: Lorin Glass, PA-C Authorized by: Lorin Glass, PA-C   Consent:    Consent obtained:  Verbal   Consent given by:  Patient   Risks discussed:  Discoloration, pain, swelling and numbness   Alternatives discussed:  No treatment and alternative treatment Pre-procedure details:    Sensation:  Normal   Skin color:  Normal Procedure details:    Laterality:  Right   Location:  Finger   Finger:  R index finger   Supplies:  Aluminum  splint Post-procedure details:    Pain:  Improved   Sensation:  Normal   Skin color:  Unchanged   Patient tolerance of procedure:  Tolerated well, no immediate complications   (including critical care time)  Medications Ordered in ED Medications  ibuprofen (ADVIL,MOTRIN) tablet 600 mg (600 mg Oral Given 09/17/17 0948)     Initial Impression / Assessment and Plan / ED Course  I have reviewed the triage vital signs and the nursing notes.  Pertinent labs & imaging results that were available during my care of the patient were reviewed by me and considered in my medical decision making (see chart for details).    [name] [name] presents today for evaluation of sudden onset right finger pain after he fell last night while drinking.  He reports that this was an isolated injury.  Verbal consent was obtained and dislocation of his finger was rapidly performed without difficulties.  Postreduction films showed normal alignment.  No obvious fractures.  Finger neurovascularly intact both before and after reduction and splinting.  Splint was placed, discharged home.  Final Clinical Impressions(s) / ED Diagnoses   Final diagnoses:  Dislocation of finger, initial encounter    ED Discharge Orders    None       Ollen Gross 09/17/17 1001    Fredia Sorrow, MD 09/17/17 [phone]

## 2017-09-17 NOTE — ED Notes (Addendum)
Pt given sprite, portable back in room for post reduction x-ray

## 2017-09-17 NOTE — ED Triage Notes (Signed)
EMS stated, he was drinking last night and fell and hurt his right index finger.

## 2017-09-17 NOTE — ED Notes (Signed)
Pt verbalized understanding of d/c instructions and has no further questions, VSS, NAD.  

## 2017-09-17 NOTE — Discharge Instructions (Addendum)
Please take your splint off and move/use your finger.  Do not wear your splint for more than 3 days.  You may continue to wear it at night for one week.  Please see your doctor if you have continued pain.

## 2017-11-05 ENCOUNTER — Encounter (HOSPITAL_COMMUNITY): Payer: Self-pay

## 2017-11-05 ENCOUNTER — Emergency Department (HOSPITAL_COMMUNITY): Payer: Medicaid Other

## 2017-11-05 ENCOUNTER — Emergency Department (HOSPITAL_COMMUNITY)
Admission: EM | Admit: 2017-11-05 | Discharge: 2017-11-06 | Disposition: A | Discharge status: Home / Self Care | Payer: Medicaid Other | Attending: Emergency Medicine | Admitting: Emergency Medicine

## 2017-11-05 DIAGNOSIS — F10920 Alcohol use, unspecified with intoxication, uncomplicated: Secondary | ICD-10-CM

## 2017-11-05 DIAGNOSIS — F17210 Nicotine dependence, cigarettes, uncomplicated: Secondary | ICD-10-CM | POA: Insufficient documentation

## 2017-11-05 DIAGNOSIS — I2510 Atherosclerotic heart disease of native coronary artery without angina pectoris: Secondary | ICD-10-CM | POA: Diagnosis not present

## 2017-11-05 DIAGNOSIS — F10929 Alcohol use, unspecified with intoxication, unspecified: Secondary | ICD-10-CM | POA: Insufficient documentation

## 2017-11-05 DIAGNOSIS — R079 Chest pain, unspecified: Secondary | ICD-10-CM | POA: Diagnosis present

## 2017-11-05 DIAGNOSIS — R1013 Epigastric pain: Secondary | ICD-10-CM | POA: Insufficient documentation

## 2017-11-05 LAB — CBC
HEMATOCRIT: 35.4 % — AB (ref 39.0–52.0)
HEMOGLOBIN: 12.6 g/dL — AB (ref 13.0–17.0)
MCH: 31.7 pg (ref 26.0–34.0)
MCHC: 35.6 g/dL (ref 30.0–36.0)
MCV: 89.2 fL (ref 78.0–100.0)
Platelets: 228 10*3/uL (ref 150–400)
RBC: 3.97 MIL/uL — AB (ref 4.22–5.81)
RDW: 12.9 % (ref 11.5–15.5)
WBC: 4.6 10*3/uL (ref 4.0–10.5)

## 2017-11-05 LAB — BASIC METABOLIC PANEL
ANION GAP: 8 (ref 5–15)
BUN: 6 mg/dL (ref 6–20)
CALCIUM: 8.6 mg/dL — AB (ref 8.9–10.3)
CO2: 26 mmol/L (ref 22–32)
Chloride: 106 mmol/L (ref 101–111)
Creatinine, Ser: 1.15 mg/dL (ref 0.61–1.24)
GFR calc Af Amer: >60 mL/min (ref >60)
GLUCOSE: 82 mg/dL (ref 65–99)
POTASSIUM: 3.4 mmol/L — AB (ref 3.5–5.1)
Sodium: 140 mmol/L (ref 135–145)

## 2017-11-05 LAB — I-STAT TROPONIN, ED: TROPONIN I, POC: 0 ng/mL (ref 0.00–0.08)

## 2017-11-05 MED ORDER — PANTOPRAZOLE SODIUM 40 MG PO TBEC
40.0000 mg | DELAYED_RELEASE_TABLET | Freq: Once | ORAL | Status: AC
Start: 2017-11-05 — End: 2017-11-06
  Administered 2017-11-06: 40 mg via ORAL
  Filled 2017-11-05: qty 1

## 2017-11-05 MED ORDER — POTASSIUM CHLORIDE CRYS ER 20 MEQ PO TBCR
40.0000 meq | EXTENDED_RELEASE_TABLET | Freq: Once | ORAL | Status: AC
  Administered 2017-11-06: 40 meq via ORAL
  Filled 2017-11-05: qty 2

## 2017-11-05 MED ORDER — GI COCKTAIL ~~LOC~~
30.0000 mL | Freq: Once | ORAL | Status: AC
  Administered 2017-11-06: 30 mL via ORAL
  Filled 2017-11-05: qty 30

## 2017-11-05 NOTE — ED Triage Notes (Signed)
Pt. From home via EMS with central nonradiating CP that started 2 days ago. Pt. Reports cp became severe today. Pt. Describes it as a crushing pain 8/10. A/O X4. NAD

## 2017-11-05 NOTE — ED Provider Notes (Signed)
[facility] EMERGENCY DEPARTMENT Provider Note   CSN: [id number] Arrival date & time: 11/05/17  2301     History   Chief Complaint Chief Complaint  Patient presents with  . Chest Pain    HPI [name] [name] is a 52 y.o. male.  The history is provided by the patient.  He is very evasive historian, but comes in complaining of chest pain.  He told me that it started this afternoon, told triage nurse that it started 2 days ago.  He is unable to characterize the pain to me although triage note states that it is crushing.  He rates the pain at 8/10.  He does have history of GERD, and states it is similar to pain he has had with GERD in the past.  He tried taking ranitidine without any relief.  He denies dyspnea but did have mild nausea and diaphoresis.  He did not vomit.  Cardiac risk factors include tobacco use (half pack of cigarettes a day), but no history of hypertension or diabetes or hyperlipidemia and no family history of premature coronary atherosclerosis.  Of note, patient is constantly falling asleep as I am trying to talk to him.  He does admit to drinking at least 12 beers today.  Past Medical History:  Diagnosis Date  . Abnormal ECG    a. early repolarization  . Arthritis    "my whole left side" (05/03/2014)  . Bipolar disorder ([place])   . Bradycardia    a. asymptomatic  . CAD in native artery    a. Nonobstructive cath 11/2007;  b. Presented with ST elevation - Nonobstructive cath 08/2011  . Chest pain, mid sternal   . Coronary artery disease   . GERD (gastroesophageal reflux disease)   . History of cocaine abuse    a. quit ? 2009  . History of ETOH abuse    a. drinks 2 "40's" / wk  . Marijuana abuse    a. uses ~ 1x /wk or less  . Pneumonia   . Stomach ulcer   . Syncope    a. 12/2010 - presumed to be vasovagal  . Tobacco abuse     Patient Active Problem List   Diagnosis Date Noted  . Major depressive disorder, recurrent episode, severe with anxious  distress ([place]) 05/23/2016  . PTSD (post-traumatic stress disorder) 05/23/2016  . Cocaine use disorder, moderate, in early remission ([place]) 05/23/2016  . Alcohol use disorder, moderate, dependence ([place]) 05/23/2016  . Affective psychosis, bipolar ([place]) 04/30/2016  . Cocaine abuse with cocaine-induced mood disorder ([place]) 03/04/2016  . Alcohol dependence with uncomplicated withdrawal ([place])   . Alcohol use disorder, severe, dependence ([place]) 01/18/2015  . Alcohol dependence with alcohol-induced mood disorder ([place])   . Suicidal ideation   . Acute pancreatitis 03/20/2012  . Polysubstance abuse ([place]) 03/20/2012  . Cocaine abuse ([place]) 01/12/2012  . Cannabis abuse 01/12/2012  . Alcohol dependence ([place]) 10/15/2011  . Abnormal ECG   . Chest pain   . CAD in native artery   . GERD (gastroesophageal reflux disease)   . Tobacco abuse   . History of cocaine abuse   . History of ETOH abuse   . Bradycardia   . Syncope     Past Surgical History:  Procedure Laterality Date  . CARDIAC CATHETERIZATION  2009; 08/2011   [name] 08/06/2011  . LEFT HEART CATHETERIZATION WITH CORONARY ANGIOGRAM N/A 08/16/2011   Procedure: LEFT HEART CATHETERIZATION WITH CORONARY ANGIOGRAM;  Surgeon: [name], MD;  Location: [facility] CATH LAB;  Service: Cardiovascular;  Laterality: N/A;        Home Medications    Prior to Admission medications   Medication Sig Start Date End Date Taking? Authorizing Provider  albuterol (PROVENTIL HFA;VENTOLIN HFA) 108 (90 Base) MCG/ACT inhaler Inhale 2 puffs into the lungs every 4 (four) hours as needed for wheezing or shortness of breath (or coughing). 6/0/10   Delora Fuel, MD  doxycycline (VIBRAMYCIN) 100 MG capsule Take 1 capsule (100 mg total) by mouth 2 (two) times daily. 02/07/22   Delora Fuel, MD    Family History History reviewed. No pertinent family history.  Social History Social History   Tobacco Use  . Smoking status: Current Some Day Smoker    Packs/day: 0.50     Years: 20.00    Pack years: 10.00    Types: Cigarettes    Last attempt to quit: 06/24/2015    Years since quitting: 2.3  . Smokeless tobacco: Never Used  Substance Use Topics  . Alcohol use: Yes    Alcohol/week: 28.8 oz    Types: 48 Cans of beer per week    Comment: 2 40 oz beers per day  . Drug use: Yes    Types: Marijuana, Cocaine     Allergies   Aspirin; Pepperoni [pickled meat]; Tomato; and Tylenol [acetaminophen]   Review of Systems Review of Systems  All other systems reviewed and are negative.    Physical Exam Updated Vital Signs BP 113/67   Pulse (!) 55   Temp 98.4 F (36.9 C) (Oral)   Resp 18   Ht 5\' 6"  (1.676 m)   Wt 58.5 kg (129 lb)   SpO2 96%   BMI 20.82 kg/m Significant  Physical Exam  Nursing note and vitals reviewed.  52 year old male, resting comfortably and in no acute distress. Vital signs are significant for mild bradycardia. Oxygen saturation is 96%, which is normal. Head is normocephalic and atraumatic. PERRLA, EOMI. Oropharynx is clear. Neck is nontender and supple without adenopathy or JVD. Back is nontender and there is no CVA tenderness. Lungs are clear without rales, wheezes, or rhonchi. Chest is nontender. Heart has regular rate and rhythm without murmur. Abdomen is soft, flat, with moderate epigastric tenderness.  Epigastric palpation does reproduce his pain.  There is no rebound or guarding.  There are no masses or hepatosplenomegaly and peristalsis is hypoactive. Extremities have no cyanosis or edema, full range of motion is present. Skin is warm and dry without rash. Neurologic: Mental status is normal, cranial nerves are intact, there are no motor or sensory deficits.  ED Treatments / Results  Labs (all labs ordered are listed, but only abnormal results are displayed) Labs Reviewed  BASIC METABOLIC PANEL - Abnormal; Notable for the following components:      Result Value   Potassium 3.4 (*)    Calcium 8.6 (*)    All other  components within normal limits  CBC - Abnormal; Notable for the following components:   RBC 3.97 (*)    Hemoglobin 12.6 (*)    HCT 35.4 (*)    All other components within normal limits  ETHANOL - Abnormal; Notable for the following components:   Alcohol, Ethyl (B) 139 (*)    All other components within normal limits  I-STAT TROPONIN, ED  I-STAT TROPONIN, ED    EKG EKG Interpretation  Date/Time:  Saturday November 05 2017 23:45:20 EDT Ventricular Rate:  63 PR Interval:    QRS Duration: 90  QT Interval:  411 QTC Calculation: 421 R Axis:   54 Text Interpretation:  Sinus rhythm ST elev, probable normal early repol pattern Otherwise within normal limits When compared with ECG of 09/09/2017, No significant change was found Confirmed by Delora Fuel ([id number]) on 11/05/2017 11:51:35 PM   Radiology Dg Chest 2 View  Result Date: 11/06/2017 CLINICAL DATA:  Acute chest pain for 2 days. EXAM: CHEST - 2 VIEW COMPARISON:  09/09/2017 and prior radiographs FINDINGS: The cardiomediastinal silhouette is unremarkable. There is no evidence of focal airspace disease, pulmonary edema, suspicious pulmonary nodule/mass, pleural effusion, or pneumothorax. No acute bony abnormalities are identified. IMPRESSION: No active cardiopulmonary disease. Electronically Signed   By: Margarette Canada M.D.   On: 11/06/2017 00:22    Procedures Procedures   Medications Ordered in ED Medications  gi cocktail (Maalox,Lidocaine,Donnatal) (30 mLs Oral Given 11/06/17 0009)  pantoprazole (PROTONIX) EC tablet 40 mg (40 mg Oral Given 11/06/17 0009)  potassium chloride SA (K-DUR,KLOR-CON) CR tablet 40 mEq (40 mEq Oral Given 11/06/17 0009)     Initial Impression / Assessment and Plan / ED Course  I have reviewed the triage vital signs and the nursing notes.  Pertinent labs & imaging results that were available during my care of the patient were reviewed by me and considered in my medical decision making (see chart for details).  Chest pain  which seems most likely to be exacerbation of GERD, possible peptic ulcer disease, possible alcoholic gastritis.  Old records are reviewed, and he did have a cardiac catheterization in 2013 which showed a 40-50% stenosis in the LAD.  ECG is unchanged.  Will give therapeutic trial of GI cocktail and pantoprazole.  It is also noted that he has several ED visits and at least one hospitalization for similar chest pain which was all felt to be GI in origin.  Initial labs showed borderline hypokalemia, and is given a dose of oral potassium.  Hemoglobin is noted to be mildly decreased, and this will need to be followed as an outpatient.  Ethanol level is elevated consistent with alcohol intoxication.  Following above-noted treatment, patient is sleeping.  When awakened, he is not complaining of any more pain.  Repeat troponin is undetectable.  He is discharged with prescription for pantoprazole, referred back to PCP.  Encouraged to abstain from alcohol.  Final Clinical Impressions(s) / ED Diagnoses   Final diagnoses:  Epigastric pain  Alcoholic intoxication without complication [facility])    ED Discharge Orders        Ordered    pantoprazole (PROTONIX) 40 MG tablet  Daily     89/38/10 1751       Delora Fuel, MD 02/58/52 [phone]

## 2017-11-06 LAB — ETHANOL: Alcohol, Ethyl (B): 139 mg/dL — ABNORMAL HIGH (ref <10)

## 2017-11-06 LAB — I-STAT TROPONIN, ED: Troponin i, poc: 0 ng/mL (ref 0.00–0.08)

## 2017-11-06 MED ORDER — PANTOPRAZOLE SODIUM 40 MG PO TBEC: 40.0000 mg | DELAYED_RELEASE_TABLET | Freq: Every day | ORAL | 0 refills | Status: DC

## 2017-11-06 NOTE — Discharge Instructions (Addendum)
Do not drink anything with alcohol in it.  It will irritate your stomach and make your pain worse.

## 2017-11-16 ENCOUNTER — Encounter (HOSPITAL_COMMUNITY): Payer: Self-pay

## 2017-11-16 ENCOUNTER — Emergency Department (HOSPITAL_COMMUNITY)
Admission: EM | Admit: 2017-11-16 | Discharge: 2017-11-16 | Disposition: A | Discharge status: Home / Self Care | Payer: Medicaid Other | Attending: Emergency Medicine | Admitting: Emergency Medicine

## 2017-11-16 DIAGNOSIS — R45851 Suicidal ideations: Secondary | ICD-10-CM

## 2017-11-16 DIAGNOSIS — F329 Major depressive disorder, single episode, unspecified: Secondary | ICD-10-CM

## 2017-11-16 DIAGNOSIS — Z915 Personal history of self-harm: Secondary | ICD-10-CM | POA: Diagnosis not present

## 2017-11-16 DIAGNOSIS — F17210 Nicotine dependence, cigarettes, uncomplicated: Secondary | ICD-10-CM | POA: Insufficient documentation

## 2017-11-16 DIAGNOSIS — F332 Major depressive disorder, recurrent severe without psychotic features: Secondary | ICD-10-CM | POA: Insufficient documentation

## 2017-11-16 DIAGNOSIS — Z9119 Patient's noncompliance with other medical treatment and regimen: Secondary | ICD-10-CM | POA: Diagnosis not present

## 2017-11-16 DIAGNOSIS — F1020 Alcohol dependence, uncomplicated: Secondary | ICD-10-CM | POA: Diagnosis present

## 2017-11-16 DIAGNOSIS — F1024 Alcohol dependence with alcohol-induced mood disorder: Secondary | ICD-10-CM | POA: Diagnosis present

## 2017-11-16 DIAGNOSIS — I2510 Atherosclerotic heart disease of native coronary artery without angina pectoris: Secondary | ICD-10-CM | POA: Diagnosis not present

## 2017-11-16 DIAGNOSIS — Z008 Encounter for other general examination: Secondary | ICD-10-CM | POA: Diagnosis present

## 2017-11-16 DIAGNOSIS — F32A Depression, unspecified: Secondary | ICD-10-CM

## 2017-11-16 DIAGNOSIS — F1410 Cocaine abuse, uncomplicated: Secondary | ICD-10-CM | POA: Diagnosis present

## 2017-11-16 LAB — COMPREHENSIVE METABOLIC PANEL
ALK PHOS: 60 U/L (ref 38–126)
ALT: 18 U/L (ref 17–63)
ANION GAP: 11 (ref 5–15)
AST: 30 U/L (ref 15–41)
Albumin: 4.6 g/dL (ref 3.5–5.0)
BILIRUBIN TOTAL: 0.8 mg/dL (ref 0.3–1.2)
BUN: 8 mg/dL (ref 6–20)
CALCIUM: 9.3 mg/dL (ref 8.9–10.3)
CO2: 24 mmol/L (ref 22–32)
Chloride: 108 mmol/L (ref 101–111)
Creatinine, Ser: 1.11 mg/dL (ref 0.61–1.24)
GFR calc non Af Amer: >60 mL/min (ref >60)
Glucose, Bld: 85 mg/dL (ref 65–99)
Potassium: 3.9 mmol/L (ref 3.5–5.1)
SODIUM: 143 mmol/L (ref 135–145)
TOTAL PROTEIN: 8 g/dL (ref 6.5–8.1)

## 2017-11-16 LAB — RAPID URINE DRUG SCREEN, HOSP PERFORMED
Amphetamines: NOT DETECTED
Barbiturates: NOT DETECTED
Benzodiazepines: NOT DETECTED
Cocaine: NOT DETECTED
OPIATES: NOT DETECTED
Tetrahydrocannabinol: POSITIVE — AB

## 2017-11-16 LAB — CBC
HCT: 40.9 % (ref 39.0–52.0)
Hemoglobin: 14.4 g/dL (ref 13.0–17.0)
MCH: 32.7 pg (ref 26.0–34.0)
MCHC: 35.2 g/dL (ref 30.0–36.0)
MCV: 93 fL (ref 78.0–100.0)
PLATELETS: 248 10*3/uL (ref 150–400)
RBC: 4.4 MIL/uL (ref 4.22–5.81)
RDW: 13.7 % (ref 11.5–15.5)
WBC: 4.4 10*3/uL (ref 4.0–10.5)

## 2017-11-16 LAB — ETHANOL: ALCOHOL ETHYL (B): 168 mg/dL — AB (ref <10)

## 2017-11-16 LAB — ACETAMINOPHEN LEVEL: Acetaminophen (Tylenol), Serum: <10 ug/mL — ABNORMAL LOW (ref 10–30)

## 2017-11-16 LAB — SALICYLATE LEVEL

## 2017-11-16 NOTE — [facility] Suicide Risk Assessment (Signed)
Suicide Risk Assessment  Discharge Assessment   [facility] Discharge Suicide Risk Assessment   Principal Problem: Alcohol dependence with alcohol-induced mood disorder [facility]) Discharge Diagnoses:  Patient Active Problem List   Diagnosis Date Noted  . Alcohol use disorder, severe, dependence ([place]) [F10.20] 01/18/2015    Priority: High  . Alcohol dependence with alcohol-induced mood disorder ([place]) [F10.24]     Priority: High  . Alcohol dependence ([place]) [F10.20] 10/15/2011    Priority: High  . Major depressive disorder, recurrent episode, severe with anxious distress ([place]) [F33.2] 05/23/2016  . PTSD (post-traumatic stress disorder) [F43.10] 05/23/2016  . Cocaine use disorder, moderate, in early remission ([place]) [F14.21] 05/23/2016  . Alcohol use disorder, moderate, dependence ([place]) [F10.20] 05/23/2016  . Affective psychosis, bipolar ([place]) [F31.9] 04/30/2016  . Cocaine abuse with cocaine-induced mood disorder ([place]) [F14.14] 03/04/2016  . Alcohol dependence with uncomplicated withdrawal ([place]) [F10.230]   . Suicidal ideation [R45.851]   . Acute pancreatitis [K85.90] 03/20/2012  . Polysubstance abuse ([place]) [F19.10] 03/20/2012  . Cannabis abuse [F12.10] 01/12/2012  . Abnormal ECG [R94.31]   . Chest pain [R07.89]   . CAD in native artery [I25.10]   . GERD (gastroesophageal reflux disease) [K21.9]   . Tobacco abuse [Z72.0]   . History of cocaine abuse [Z87.898]   . History of ETOH abuse [Z87.898]   . Bradycardia [R00.1]   . Syncope [R55]     Total Time spent with patient: 45 minutes  Musculoskeletal: Strength & Muscle Tone: within normal limits Gait & Station: normal Patient leans: N/A  Psychiatric Specialty Exam:   Blood pressure 108/63, pulse 70, temperature 99.3 F (37.4 C), temperature source Oral, resp. rate 17, height 5\' 6"  (1.676 m), weight 59 kg (130 lb), SpO2 99 %.Body mass index is 20.98 kg/m.  General Appearance: Casual  Eye Contact::  Good  Speech:  Normal Rate  Volume:   Normal  Mood:  Euthymic  Affect:  Congruent  Thought Process:  Coherent and Descriptions of Associations: Intact  Orientation:  Full (Time, Place, and Person)  Thought Content:  WDL and Logical  Suicidal Thoughts:  No  Homicidal Thoughts:  No  Memory:  Immediate;   Good Recent;   Good Remote;   Good  Judgement:  Fair  Insight:  Fair  Psychomotor Activity:  Normal  Concentration:  Good  Recall:  Good  Fund of Knowledge:Fair  Language: Good  Akathisia:  No  Handed:  Right  AIMS (if indicated):     Assets:  Leisure Time Physical Health Resilience Social Support  Sleep:     Cognition: WNL  ADL's:  Intact   Mental Status Per Nursing Assessment::   On Admission:   52 yo male who presented to the ED under the influence of alcohol with suicidal ideations.  Today, he denies suicidal/homicidal ideations, hallucinations, and withdrawal symptoms. Resources provided by Peer support, stable for discharge.  Demographic Factors:  Male  Loss Factors: NA  Historical Factors: NA  Risk Reduction Factors:   Positive social support  Continued Clinical Symptoms:  NOne  Cognitive Features That Contribute To Risk:  None    Suicide Risk:  Minimal: No identifiable suicidal ideation.  Patients presenting with no risk factors but with morbid ruminations; may be classified as minimal risk based on the severity of the depressive symptoms    Plan Of Care/Follow-up recommendations:  Activity:  as tolerated Diet:  heart healthy diet  LORD, Theodoro Clock, NP 11/16/2017, 12:43 PM

## 2017-11-16 NOTE — ED Notes (Signed)
Pt d/c home per MD order. Discharge summary reviewed with pt. Pt verbalizes understanding. Pt denies SI/HI/AVH. Pt signed e-signature. Ambulatory off unit with [facility].

## 2017-11-16 NOTE — ED Provider Notes (Signed)
[facility] DEPT Provider Note   CSN: [id number] Arrival date & time: 11/16/17  0203  Time seen 04:08 AM   History   Chief Complaint Chief Complaint  Patient presents with  . Suicidal  . Homicidal    HPI [name] [name] is a 52 y.o. male.  HPI patient states he has "a lot of depression".  He states he went to stay with his brother in [place] from October to March.  He came back to [facility] in March.  He states he lost a good friend that he had had for 12 years about 5 months ago and since then he has been feeling depressed.  When I asked him if if he feels like he is going to harm himself he states "I am not sure".  He states also "that is why I am here".  He states he has hurt himself in the past by overdosing.  He states his last admission was several years ago.  He states he is not taking his medication for couple months.  PCP [name], MD   Past Medical History:  Diagnosis Date  . Abnormal ECG    a. early repolarization  . Arthritis    "my whole left side" (05/03/2014)  . Bipolar disorder ([place])   . Bradycardia    a. asymptomatic  . CAD in native artery    a. Nonobstructive cath 11/2007;  b. Presented with ST elevation - Nonobstructive cath 08/2011  . Chest pain, mid sternal   . Coronary artery disease   . GERD (gastroesophageal reflux disease)   . History of cocaine abuse    a. quit ? 2009  . History of ETOH abuse    a. drinks 2 "40's" / wk  . Marijuana abuse    a. uses ~ 1x /wk or less  . Pneumonia   . Stomach ulcer   . Syncope    a. 12/2010 - presumed to be vasovagal  . Tobacco abuse     Patient Active Problem List   Diagnosis Date Noted  . Major depressive disorder, recurrent episode, severe with anxious distress ([place]) 05/23/2016  . PTSD (post-traumatic stress disorder) 05/23/2016  . Cocaine use disorder, moderate, in early remission ([place]) 05/23/2016  . Alcohol use disorder, moderate, dependence ([place]) 05/23/2016    . Affective psychosis, bipolar ([place]) 04/30/2016  . Cocaine abuse with cocaine-induced mood disorder ([place]) 03/04/2016  . Alcohol dependence with uncomplicated withdrawal ([place])   . Alcohol use disorder, severe, dependence ([place]) 01/18/2015  . Alcohol dependence with alcohol-induced mood disorder ([place])   . Suicidal ideation   . Acute pancreatitis 03/20/2012  . Polysubstance abuse ([place]) 03/20/2012  . Cocaine abuse ([place]) 01/12/2012  . Cannabis abuse 01/12/2012  . Alcohol dependence ([place]) 10/15/2011  . Abnormal ECG   . Chest pain   . CAD in native artery   . GERD (gastroesophageal reflux disease)   . Tobacco abuse   . History of cocaine abuse   . History of ETOH abuse   . Bradycardia   . Syncope     Past Surgical History:  Procedure Laterality Date  . CARDIAC CATHETERIZATION  2009; 08/2011   [name] 08/06/2011  . LEFT HEART CATHETERIZATION WITH CORONARY ANGIOGRAM N/A 08/16/2011   Procedure: LEFT HEART CATHETERIZATION WITH CORONARY ANGIOGRAM;  Surgeon: [name], MD;  Location: [facility] CATH LAB;  Service: Cardiovascular;  Laterality: N/A;        Home Medications    Prior to Admission medications  Medication Sig Start Date End Date Taking? Authorizing Provider  pantoprazole (PROTONIX) 40 MG tablet Take 1 tablet (40 mg total) by mouth daily. [birth year]  Yes Delora Fuel, MD    Family History History reviewed. No pertinent family history.  Social History Social History   Tobacco Use  . Smoking status: Current Some Day Smoker    Packs/day: 0.50    Years: 20.00    Pack years: 10.00    Types: Cigarettes    Last attempt to quit: 06/24/2015    Years since quitting: 2.4  . Smokeless tobacco: Never Used  Substance Use Topics  . Alcohol use: Yes    Alcohol/week: 28.8 oz    Types: 48 Cans of beer per week    Comment: 2 40 oz beers per day  . Drug use: Yes    Types: Marijuana, Cocaine     Allergies   Aspirin; Pepperoni [pickled meat]; Tomato; and Tylenol  [acetaminophen]   Review of Systems Review of Systems  All other systems reviewed and are negative.    Physical Exam Updated Vital Signs BP 108/63 (BP Location: Left Arm)   Pulse 70   Temp 99.3 F (37.4 C) (Oral)   Resp 17   Ht 5\' 6"  (1.676 m)   Wt 59 kg (130 lb)   SpO2 99%   BMI 20.98 kg/m   Physical Exam  Constitutional: He is oriented to person, place, and time. He appears well-developed and well-nourished.  Non-toxic appearance. He does not appear ill. No distress.  HENT:  Head: Normocephalic and atraumatic.  Right Ear: External ear normal.  Left Ear: External ear normal.  Nose: Nose normal. No mucosal edema or rhinorrhea.  Mouth/Throat: Oropharynx is clear and moist and mucous membranes are normal. No dental abscesses or uvula swelling.  Eyes: Pupils are equal, round, and reactive to light. Conjunctivae and EOM are normal.  Neck: Normal range of motion and full passive range of motion without pain. Neck supple.  Cardiovascular: Normal rate, regular rhythm and normal heart sounds. Exam reveals no gallop and no friction rub.  No murmur heard. Pulmonary/Chest: Effort normal and breath sounds normal. No respiratory distress. He has no wheezes. He has no rhonchi. He has no rales. He exhibits no tenderness and no crepitus.  Abdominal: Soft. Normal appearance and bowel sounds are normal. He exhibits no distension. There is no tenderness. There is no rebound and no guarding.  Musculoskeletal: Normal range of motion. He exhibits no edema or tenderness.  Moves all extremities well.   Neurological: He is alert and oriented to person, place, and time. He has normal strength. No cranial nerve deficit.  Skin: Skin is warm, dry and intact. No rash noted. No erythema. No pallor.  Psychiatric: His affect is blunt. His speech is delayed. He is slowed. He expresses suicidal ideation.  Nursing note and vitals reviewed.    ED Treatments / Results  Labs (all labs ordered are listed, but  only abnormal results are displayed) Results for orders placed or performed during the hospital encounter of 11/16/17  Comprehensive metabolic panel  Result Value Ref Range   Sodium 143 135 - 145 mmol/L   Potassium 3.9 3.5 - 5.1 mmol/L   Chloride 108 101 - 111 mmol/L   CO2 24 22 - 32 mmol/L   Glucose, Bld 85 65 - 99 mg/dL   BUN 8 6 - 20 mg/dL   Creatinine, Ser 1.11 0.61 - 1.24 mg/dL   Calcium 9.3 8.9 - 10.3 mg/dL   Total  Protein 8.0 6.5 - 8.1 g/dL   Albumin 4.6 3.5 - 5.0 g/dL   AST 30 15 - 41 U/L   ALT 18 17 - 63 U/L   Alkaline Phosphatase 60 38 - 126 U/L   Total Bilirubin 0.8 0.3 - 1.2 mg/dL   GFR calc non Af Amer >60 >60 mL/min   GFR calc Af Amer >60 >60 mL/min   Anion gap 11 5 - 15  Ethanol  Result Value Ref Range   Alcohol, Ethyl (B) 168 (H) <70 mg/dL  Salicylate level  Result Value Ref Range   Salicylate Lvl <2.6 2.8 - 30.0 mg/dL  Acetaminophen level  Result Value Ref Range   Acetaminophen (Tylenol), Serum <10 (L) 10 - 30 ug/mL  cbc  Result Value Ref Range   WBC 4.4 4.0 - 10.5 K/uL   RBC 4.40 4.22 - 5.81 MIL/uL   Hemoglobin 14.4 13.0 - 17.0 g/dL   HCT 40.9 39.0 - 52.0 %   MCV 93.0 78.0 - 100.0 fL   MCH 32.7 26.0 - 34.0 pg   MCHC 35.2 30.0 - 36.0 g/dL   RDW 13.7 11.5 - 15.5 %   Platelets 248 150 - 400 K/uL  Rapid urine drug screen (hospital performed)  Result Value Ref Range   Opiates NONE DETECTED NONE DETECTED   Cocaine NONE DETECTED NONE DETECTED   Benzodiazepines NONE DETECTED NONE DETECTED   Amphetamines NONE DETECTED NONE DETECTED   Tetrahydrocannabinol POSITIVE (A) NONE DETECTED   Barbiturates NONE DETECTED NONE DETECTED   Laboratory interpretation all normal except alcohol intoxication, positive UDS for marijuana    EKG None  Radiology No results found.  Procedures Procedures (including critical care time)  Medications Ordered in ED Medications - No data to display   Initial Impression / Assessment and Plan / ED Course  I have reviewed  the triage vital signs and the nursing notes.  Pertinent labs & imaging results that were available during my care of the patient were reviewed by me and considered in my medical decision making (see chart for details).    4:43 AM TTS consult was ordered and psychiatric holding orders were done.  07:30 AM Waiting for TTS consult  Final Clinical Impressions(s) / ED Diagnoses   Final diagnoses:  Depression, unspecified depression type  Suicidal ideation    Disposition pending  Rolland Porter, MD, Barbette Or, MD 11/16/17 [phone]

## 2017-11-16 NOTE — ED Triage Notes (Signed)
Pt has consumed alcohol and smoked marijuana today but is not intoxicated currently Pt is having thoughts of hurting himself and others

## 2017-11-16 NOTE — Discharge Instructions (Signed)
For your behavioral health needs you are advised to follow up with [facility].  New patients are seen at their walk-in clinic.  Walk-in hours are Monday - Friday from 8:00 am - 12:00 pm, and from 1:00 pm - 3:00 pm.  Walk-in patients are seen on a first come, first served basis, so try to arrive as early as possible for the best chance of being seen the same day.  There is an initial fee of $22.50: ° °     [facility] °     [address] °     [place], [place] [address] °     [phone] °

## 2017-11-16 NOTE — Patient Outreach (Signed)
ED Peer Support Specialist Patient Intake (Complete at intake & 30-60 Day Follow-up)  Name: [name] [name]  MRN: [id number]  Age: 52 y.o.   Date of Admission: 11/16/2017  Intake: Initial Comments:      Primary Reason Admitted: Pt has consumed alcohol and smoked marijuana today but is not intoxicated currently Pt is having thoughts of hurting himself and others    Lab values: Alcohol/ETOH: Positive Positive UDS? No Amphetamines: No Barbiturates: No Benzodiazepines: No Cocaine: No Opiates: No Cannabinoids: Yes  Demographic information: Gender: Male Ethnicity: African American Marital Status: Single Insurance Status: Medicaid Ecologist (Work Neurosurgeon, Physicist, medical, etc.: No Lives with: Alone Living situation: House/Apartment  Reported Patient History: Patient reported health conditions: Heart disease Patient aware of HIV and hepatitis status: No  In past year, has patient visited ED for any reason? Yes  Number of ED visits: 7  Reason(s) for visit: Various reasons   In past year, has patient been hospitalized for any reason? No  Number of hospitalizations:    Reason(s) for hospitalization:    In past year, has patient been arrested? No  Number of arrests:    Reason(s) for arrest:    In past year, has patient been incarcerated? No  Number of incarcerations:    Reason(s) for incarceration:    In past year, has patient received medication-assisted treatment? No  In past year, patient received the following treatments: Residential treatment (non-hospital)  In past year, has patient received any harm reduction services? No  Did this include any of the following?    In past year, has patient received care from a mental health provider for diagnosis other than SUD? No  In past year, is this first time patient has overdosed? No  Number of past overdoses:    In past year, is this first time patient has been hospitalized for an  overdose? No  Number of hospitalizations for overdose(s):    Is patient currently receiving treatment for a mental health diagnosis? No  Patient reports experiencing difficulty participating in SUD treatment: No    Most important reason(s) for this difficulty?    Has patient received prior services for treatment? No  In past, patient has received services from following agencies:    Plan of Care:  Suggested follow up at these agencies/treatment centers: [facility])  Other information: CPSS met with Pt and was able to address the issues that he was having. CPSS talked with Pt to gain understanding on what services he is seeking. CPSS was made aware that Pt was wanting Detox services. CPSS addressed the issues that CPSS Jose is faxing Pt information to a few facilities to see about getting Pt placed.    Aaron Edelman Shavonne Ambroise, CPSS  11/16/2017 12:37 PM

## 2017-11-16 NOTE — BH Assessment (Signed)
[facility] Assessment Progress Note  Per Buford Dresser, DO, this pt does not require psychiatric hospitalization at this time.  Pt is to be discharged from [facility] with recommendation to continue treatment with [facility].  This has been included in pt's discharge instructions.  Pt would also benefit from seeing Peer Support Specialists; they will be asked to speak to pt.  Pt's nurse, Caryl Pina, has been notified.  Jalene Mullet, [place] Triage Specialist [phone]

## 2017-11-16 NOTE — ED Notes (Addendum)
Pt to room #42. Pt reports increase in stress and depression. Pt reports identifiable stressor is the death of his lady friend 5 months ago. Pt denies HI/AVH, Endorsing passive SI. Pt reports he is currently "between houses." Encouragement and support provided. Special checks q 15 mins in place for safety, Video monitoring in place. Will continue to monitor.

## 2017-11-16 NOTE — BH Assessment (Addendum)
Assessment Note  [name] [name] is an 52 y.o. male. Pt reports SI with a plan to jump off a bridge. Pt denies HI and AVH. Pt reports alcohol use. Pt states he is depressed due to losing his friend of 12 years. Pt reports previous mental health history. Pt denies current mental health medications.   Dr. Mariea Clonts and York Cerise, DNP recommend D/C and peer support.  Diagnosis:  F33.2 MDD  Past Medical History:  Past Medical History:  Diagnosis Date  . Abnormal ECG    a. early repolarization  . Arthritis    "my whole left side" (05/03/2014)  . Bipolar disorder ([place])   . Bradycardia    a. asymptomatic  . CAD in native artery    a. Nonobstructive cath 11/2007;  b. Presented with ST elevation - Nonobstructive cath 08/2011  . Chest pain, mid sternal   . Coronary artery disease   . GERD (gastroesophageal reflux disease)   . History of cocaine abuse    a. quit ? 2009  . History of ETOH abuse    a. drinks 2 "40's" / wk  . Marijuana abuse    a. uses ~ 1x /wk or less  . Pneumonia   . Stomach ulcer   . Syncope    a. 12/2010 - presumed to be vasovagal  . Tobacco abuse     Past Surgical History:  Procedure Laterality Date  . CARDIAC CATHETERIZATION  2009; 08/2011   [name] 08/06/2011  . LEFT HEART CATHETERIZATION WITH CORONARY ANGIOGRAM N/A 08/16/2011   Procedure: LEFT HEART CATHETERIZATION WITH CORONARY ANGIOGRAM;  Surgeon: [name], MD;  Location: [facility] CATH LAB;  Service: Cardiovascular;  Laterality: N/A;    Family History: History reviewed. No pertinent family history.  Social History:  reports that he has been smoking cigarettes.  He has a 10.00 pack-year smoking history. He has never used smokeless tobacco. He reports that he drinks about 28.8 oz of alcohol per week. He reports that he has current or past drug history. Drugs: Marijuana and Cocaine.  Additional Social History:  Alcohol / Drug Use Pain Medications: please see mar Prescriptions: please see mar Over the Counter: please  see mar History of alcohol / drug use?: Yes Longest period of sobriety (when/how long): unknown Negative Consequences of Use: Financial, Scientist, research (physical sciences), Work / Youth worker, Personal relationships Substance #1 Name of Substance 1: alcohol 1 - Age of First Use: unknown 1 - Amount (size/oz): unknown 1 - Frequency: unknown 1 - Duration: unknown 1 - Last Use / Amount: unknown  CIWA: CIWA-Ar BP: 108/63 Pulse Rate: 70 COWS:    Allergies:  Allergies  Allergen Reactions  . Aspirin Other (See Comments)    Aggravates ulcer. "Causes chest pain."  . Pepperoni [Pickled Meat] Other (See Comments)    aggrevates ulcer  . Tomato Other (See Comments)    Foods with tomato sauce aggrevate ulcers  . Tylenol [Acetaminophen] Other (See Comments)    Pt reports hx of ulcers    Home Medications:  (Not in a hospital admission)  OB/GYN Status:  No LMP for male patient.  General Assessment Data Location of Assessment: [facility] ED TTS Assessment: In system Is this a Tele or Face-to-Face Assessment?: Face-to-Face Is this an Initial Assessment or a Re-assessment for this encounter?: Initial Assessment Marital status: Single Maiden name: NA Is patient pregnant?: No Pregnancy Status: No Living Arrangements: Other (Comment)(homeless) Can pt return to current living arrangement?: No Admission Status: Voluntary Is patient capable of signing voluntary admission?: Yes Referral  Source: Self/Family/Friend Insurance type: Medicaid     Crisis Care Plan Living Arrangements: Other (Comment)(homeless) Legal Guardian: Other:(self) Name of Psychiatrist: NA Name of Therapist: NA  Education Status Is patient currently in school?: No Is the patient employed, unemployed or receiving disability?: Unemployed  Risk to self with the past 6 months Suicidal Ideation: Yes-Currently Present Has patient been a risk to self within the past 6 months prior to admission? : No Suicidal Intent: Yes-Currently Present Has patient had any  suicidal intent within the past 6 months prior to admission? : No Is patient at risk for suicide?: Yes Suicidal Plan?: Yes-Currently Present Has patient had any suicidal plan within the past 6 months prior to admission? : No Specify Current Suicidal Plan: to jump off a bridge Access to Means: No What has been your use of drugs/alcohol within the last 12 months?: NA Previous Attempts/Gestures: Yes How many times?: 1 Other Self Harm Risks: NA Triggers for Past Attempts: None known Intentional Self Injurious Behavior: None Family Suicide History: No Recent stressful life event(s): Loss (Comment) Persecutory voices/beliefs?: No Depression: Yes Depression Symptoms: Tearfulness, Isolating, Loss of interest in usual pleasures, Feeling worthless/self pity Substance abuse history and/or treatment for substance abuse?: Yes Suicide prevention information given to non-admitted patients: Not applicable  Risk to Others within the past 6 months Homicidal Ideation: No Does patient have any lifetime risk of violence toward others beyond the six months prior to admission? : No Thoughts of Harm to Others: No Current Homicidal Intent: No Current Homicidal Plan: No Access to Homicidal Means: No Identified Victim: NA History of harm to others?: No Assessment of Violence: None Noted Violent Behavior Description: NA Does patient have access to weapons?: No Criminal Charges Pending?: No Does patient have a court date: No Is patient on probation?: No  Psychosis Hallucinations: None noted Delusions: None noted  Mental Status Report Appearance/Hygiene: Unremarkable Eye Contact: Fair Motor Activity: Freedom of movement Speech: Logical/coherent Level of Consciousness: Alert Mood: Depressed Affect: Depressed Anxiety Level: Minimal Thought Processes: Coherent, Relevant Judgement: Unimpaired Orientation: Person, Place, Time, Situation Obsessive Compulsive Thoughts/Behaviors: None  Cognitive  Functioning Concentration: Normal Memory: Recent Intact, Remote Intact Is patient IDD: No Is patient DD?: No Insight: Fair Impulse Control: Fair Appetite: Fair Have you had any weight changes? : No Change Sleep: No Change Total Hours of Sleep: 8 Vegetative Symptoms: None  ADLScreening [facility] Assessment Services) Patient's cognitive ability adequate to safely complete daily activities?: Yes Patient able to express need for assistance with ADLs?: Yes Independently performs ADLs?: Yes (appropriate for developmental age)  Prior Inpatient Therapy Prior Inpatient Therapy: Yes Prior Therapy Dates: unknown Prior Therapy Facilty/Provider(s): unknown Reason for Treatment: unknown  Prior Outpatient Therapy Prior Outpatient Therapy: Yes Prior Therapy Dates: unknown Prior Therapy Facilty/Provider(s): unknown Reason for Treatment: unknown Does patient have an ACCT team?: No Does patient have Intensive In-House Services?  : No Does patient have [facility] services? : No Does patient have P4CC services?: No  ADL Screening (condition at time of admission) Patient's cognitive ability adequate to safely complete daily activities?: Yes Is the patient deaf or have difficulty hearing?: No Does the patient have difficulty seeing, even when wearing glasses/contacts?: No Does the patient have difficulty concentrating, remembering, or making decisions?: No Patient able to express need for assistance with ADLs?: Yes Does the patient have difficulty dressing or bathing?: No Independently performs ADLs?: Yes (appropriate for developmental age)       Abuse/Neglect Assessment (Assessment to be complete while patient is alone) Abuse/Neglect  Assessment Can Be Completed: Yes Physical Abuse: Denies Verbal Abuse: Denies Sexual Abuse: Denies Exploitation of patient/patient's resources: Denies     Regulatory affairs officer (For Healthcare) Does Patient Have a Medical Advance Directive?: No Would patient like  information on creating a medical advance directive?: No - Patient declined    Additional Information 1:1 In Past 12 Months?: No CIRT Risk: No Elopement Risk: No Does patient have medical clearance?: Yes     Disposition:  Disposition Initial Assessment Completed for this Encounter: Yes Disposition of Patient: (am psych evaulation)  On Site Evaluation by:   Reviewed with Physician:    Cyndia Bent 11/16/2017 8:54 AM

## 2017-11-16 NOTE — ED Notes (Addendum)
Pt stated "I drank 2 40's today.  I haven't taken my meds in well over a year.  I live with my uncle but I still use [facility] for mail.  I have 5 girls.  Their mother died 51 years ago.  Nobody has time for an old person.  I have 10 grandkids too.  I think I've tried to hard to keep in touch with them.  It's the depression."

## 2017-11-16 NOTE — ED Notes (Signed)
Bed: BX03 Expected date:  Expected time:  Means of arrival:  Comments: Triage 7

## 2018-02-08 ENCOUNTER — Encounter (HOSPITAL_COMMUNITY): Payer: Self-pay | Admitting: *Deleted

## 2018-02-08 ENCOUNTER — Encounter (HOSPITAL_COMMUNITY): Payer: Self-pay | Admitting: Emergency Medicine

## 2018-02-08 ENCOUNTER — Emergency Department (HOSPITAL_COMMUNITY)
Admission: EM | Admit: 2018-02-08 | Discharge: 2018-02-08 | Disposition: A | Discharge status: Other Acute Inpatient Hospital | Payer: Medicaid Other | Attending: Emergency Medicine | Admitting: Emergency Medicine

## 2018-02-08 ENCOUNTER — Other Ambulatory Visit: Payer: Self-pay

## 2018-02-08 ENCOUNTER — Inpatient Hospital Stay (HOSPITAL_COMMUNITY)
Admission: AD | Admit: 2018-02-08 | Discharge: 2018-02-15 | DRG: 885 | Disposition: A | Discharge status: Home / Self Care | Payer: Medicaid Other | Source: Intra-hospital | Attending: Psychiatry | Admitting: Psychiatry

## 2018-02-08 DIAGNOSIS — Z886 Allergy status to analgesic agent status: Secondary | ICD-10-CM

## 2018-02-08 DIAGNOSIS — F1024 Alcohol dependence with alcohol-induced mood disorder: Secondary | ICD-10-CM | POA: Diagnosis present

## 2018-02-08 DIAGNOSIS — K219 Gastro-esophageal reflux disease without esophagitis: Secondary | ICD-10-CM | POA: Diagnosis present

## 2018-02-08 DIAGNOSIS — F1290 Cannabis use, unspecified, uncomplicated: Secondary | ICD-10-CM | POA: Insufficient documentation

## 2018-02-08 DIAGNOSIS — F332 Major depressive disorder, recurrent severe without psychotic features: Secondary | ICD-10-CM | POA: Diagnosis present

## 2018-02-08 DIAGNOSIS — Z818 Family history of other mental and behavioral disorders: Secondary | ICD-10-CM | POA: Diagnosis not present

## 2018-02-08 DIAGNOSIS — I2510 Atherosclerotic heart disease of native coronary artery without angina pectoris: Secondary | ICD-10-CM | POA: Insufficient documentation

## 2018-02-08 DIAGNOSIS — M1990 Unspecified osteoarthritis, unspecified site: Secondary | ICD-10-CM | POA: Diagnosis present

## 2018-02-08 DIAGNOSIS — G4700 Insomnia, unspecified: Secondary | ICD-10-CM | POA: Diagnosis present

## 2018-02-08 DIAGNOSIS — F17210 Nicotine dependence, cigarettes, uncomplicated: Secondary | ICD-10-CM | POA: Diagnosis present

## 2018-02-08 DIAGNOSIS — F1490 Cocaine use, unspecified, uncomplicated: Secondary | ICD-10-CM | POA: Insufficient documentation

## 2018-02-08 DIAGNOSIS — F1414 Cocaine abuse with cocaine-induced mood disorder: Secondary | ICD-10-CM | POA: Diagnosis present

## 2018-02-08 DIAGNOSIS — R45851 Suicidal ideations: Secondary | ICD-10-CM | POA: Diagnosis present

## 2018-02-08 DIAGNOSIS — Z7289 Other problems related to lifestyle: Secondary | ICD-10-CM | POA: Diagnosis not present

## 2018-02-08 DIAGNOSIS — F4310 Post-traumatic stress disorder, unspecified: Secondary | ICD-10-CM | POA: Diagnosis present

## 2018-02-08 DIAGNOSIS — Z91018 Allergy to other foods: Secondary | ICD-10-CM | POA: Diagnosis not present

## 2018-02-08 DIAGNOSIS — Z915 Personal history of self-harm: Secondary | ICD-10-CM

## 2018-02-08 DIAGNOSIS — Z811 Family history of alcohol abuse and dependence: Secondary | ICD-10-CM | POA: Diagnosis not present

## 2018-02-08 LAB — SALICYLATE LEVEL: Salicylate Lvl: <7 mg/dL (ref 2.8–30.0)

## 2018-02-08 LAB — COMPREHENSIVE METABOLIC PANEL
ALT: 25 U/L (ref 0–44)
AST: 29 U/L (ref 15–41)
Albumin: 4.1 g/dL (ref 3.5–5.0)
Alkaline Phosphatase: 70 U/L (ref 38–126)
Anion gap: 8 (ref 5–15)
BUN: 15 mg/dL (ref 6–20)
CHLORIDE: 106 mmol/L (ref 98–111)
CO2: 26 mmol/L (ref 22–32)
CREATININE: 1.06 mg/dL (ref 0.61–1.24)
Calcium: 9.4 mg/dL (ref 8.9–10.3)
GFR calc Af Amer: >60 mL/min (ref >60)
GFR calc non Af Amer: >60 mL/min (ref >60)
GLUCOSE: 103 mg/dL — AB (ref 70–99)
Potassium: 4.2 mmol/L (ref 3.5–5.1)
Sodium: 140 mmol/L (ref 135–145)
Total Bilirubin: 0.7 mg/dL (ref 0.3–1.2)
Total Protein: 7.3 g/dL (ref 6.5–8.1)

## 2018-02-08 LAB — CBC
HCT: 38.8 % — ABNORMAL LOW (ref 39.0–52.0)
Hemoglobin: 13.6 g/dL (ref 13.0–17.0)
MCH: 31.8 pg (ref 26.0–34.0)
MCHC: 35.1 g/dL (ref 30.0–36.0)
MCV: 90.7 fL (ref 78.0–100.0)
Platelets: 254 10*3/uL (ref 150–400)
RBC: 4.28 MIL/uL (ref 4.22–5.81)
RDW: 13 % (ref 11.5–15.5)
WBC: 4.1 10*3/uL (ref 4.0–10.5)

## 2018-02-08 LAB — RAPID URINE DRUG SCREEN, HOSP PERFORMED
AMPHETAMINES: NOT DETECTED
Barbiturates: NOT DETECTED
Benzodiazepines: NOT DETECTED
Cocaine: NOT DETECTED
OPIATES: NOT DETECTED
TETRAHYDROCANNABINOL: POSITIVE — AB

## 2018-02-08 LAB — ETHANOL: Alcohol, Ethyl (B): <10 mg/dL (ref <10)

## 2018-02-08 LAB — ACETAMINOPHEN LEVEL: Acetaminophen (Tylenol), Serum: <10 ug/mL — ABNORMAL LOW (ref 10–30)

## 2018-02-08 MED ORDER — TRAZODONE HCL 50 MG PO TABS
50.0000 mg | ORAL_TABLET | Freq: Every evening | ORAL | Status: DC | PRN
  Administered 2018-02-08: 50 mg via ORAL
  Filled 2018-02-08: qty 1

## 2018-02-08 MED ORDER — ALUM & MAG HYDROXIDE-SIMETH 200-200-20 MG/5ML PO SUSP: 30.0000 mL | ORAL | Status: DC | PRN

## 2018-02-08 MED ORDER — TRAZODONE HCL 50 MG PO TABS
50.0000 mg | ORAL_TABLET | Freq: Every day | ORAL | Status: DC
  Administered 2018-02-11: 50 mg via ORAL
  Filled 2018-02-08 (×5): qty 1

## 2018-02-08 MED ORDER — MAGNESIUM HYDROXIDE 400 MG/5ML PO SUSP: 30.0000 mL | Freq: Every day | ORAL | Status: DC | PRN

## 2018-02-08 MED ORDER — TRAZODONE HCL 50 MG PO TABS: 50.0000 mg | ORAL_TABLET | Freq: Every day | ORAL | Status: DC

## 2018-02-08 MED ORDER — HYDROXYZINE HCL 25 MG PO TABS
25.0000 mg | ORAL_TABLET | Freq: Three times a day (TID) | ORAL | Status: DC
  Administered 2018-02-08: 25 mg via ORAL
  Filled 2018-02-08: qty 1

## 2018-02-08 MED ORDER — SERTRALINE HCL 50 MG PO TABS
25.0000 mg | ORAL_TABLET | Freq: Every day | ORAL | Status: DC
  Administered 2018-02-08: 25 mg via ORAL
  Filled 2018-02-08: qty 1

## 2018-02-08 MED ORDER — SERTRALINE HCL 25 MG PO TABS
25.0000 mg | ORAL_TABLET | Freq: Every day | ORAL | Status: DC
  Administered 2018-02-09: 25 mg via ORAL
  Filled 2018-02-08 (×3): qty 1

## 2018-02-08 MED ORDER — HYDROXYZINE HCL 25 MG PO TABS
25.0000 mg | ORAL_TABLET | Freq: Three times a day (TID) | ORAL | Status: DC | PRN
  Administered 2018-02-08: 25 mg via ORAL
  Filled 2018-02-08: qty 1

## 2018-02-08 MED ORDER — HYDROXYZINE HCL 25 MG PO TABS
25.0000 mg | ORAL_TABLET | Freq: Three times a day (TID) | ORAL | Status: DC
  Administered 2018-02-09 – 2018-02-10 (×4): 25 mg via ORAL
  Filled 2018-02-08 (×10): qty 1

## 2018-02-08 NOTE — ED Triage Notes (Signed)
Pt reports that hearing voices to tell him to kill himself and finds himself standing on a bridge. Family services called 911 and an officer brought pt in today for evaluation. Pt reports that voices and thoughts of self harm have been ongoing for couple months.

## 2018-02-08 NOTE — ED Provider Notes (Signed)
Emergency Department Provider Note   I have reviewed the triage vital signs and the nursing notes.   HISTORY  Chief Complaint Suicidal and Hallucinations   HPI [name] [name] is a 52 y.o. male with medical history as documented below the presents to the emergency department today secondary to suicidal thoughts.  Patient states that he is a worsening depression lately and voices telling him to kill himself.  He found himself down abrading or jump off but he did not do it.  States he still has that desire.  States he has too many life stressors.  No medical issues. No other associated or modifying symptoms.    Past Medical History:  Diagnosis Date  . Abnormal ECG    a. early repolarization  . Arthritis    "my whole left side" (05/03/2014)  . Bipolar disorder ([place])   . Bradycardia    a. asymptomatic  . CAD in native artery    a. Nonobstructive cath 11/2007;  b. Presented with ST elevation - Nonobstructive cath 08/2011  . Chest pain, mid sternal   . Coronary artery disease   . GERD (gastroesophageal reflux disease)   . History of cocaine abuse    a. quit ? 2009  . History of ETOH abuse    a. drinks 2 "40's" / wk  . Marijuana abuse    a. uses ~ 1x /wk or less  . Pneumonia   . Stomach ulcer   . Syncope    a. 12/2010 - presumed to be vasovagal  . Tobacco abuse     Patient Active Problem List   Diagnosis Date Noted  . Major depressive disorder, recurrent episode, severe with anxious distress ([place]) 05/23/2016  . PTSD (post-traumatic stress disorder) 05/23/2016  . Cocaine use disorder, moderate, in early remission ([place]) 05/23/2016  . Alcohol use disorder, moderate, dependence ([place]) 05/23/2016  . Affective psychosis, bipolar ([place]) 04/30/2016  . Cocaine abuse with cocaine-induced mood disorder ([place]) 03/04/2016  . Alcohol dependence with uncomplicated withdrawal ([place])   . Alcohol use disorder, severe, dependence ([place]) 01/18/2015  . Alcohol dependence with alcohol-induced  mood disorder ([place])   . Suicidal ideation   . Acute pancreatitis 03/20/2012  . Polysubstance abuse ([place]) 03/20/2012  . Cannabis abuse 01/12/2012  . Alcohol dependence ([place]) 10/15/2011  . Abnormal ECG   . Chest pain   . CAD in native artery   . GERD (gastroesophageal reflux disease)   . Tobacco abuse   . History of cocaine abuse   . History of ETOH abuse   . Bradycardia   . Syncope     Past Surgical History:  Procedure Laterality Date  . CARDIAC CATHETERIZATION  2009; 08/2011   [name] 08/06/2011  . LEFT HEART CATHETERIZATION WITH CORONARY ANGIOGRAM N/A 08/16/2011   Procedure: LEFT HEART CATHETERIZATION WITH CORONARY ANGIOGRAM;  Surgeon: [name], MD;  Location: [facility] CATH LAB;  Service: Cardiovascular;  Laterality: N/A;    Current Outpatient Rx  . Order #: [id number] Class: Print    Allergies Aspirin; Pepperoni [pickled meat]; Tomato; and Tylenol [acetaminophen]  No family history on file.  Social History Social History   Tobacco Use  . Smoking status: Current Some Day Smoker    Packs/day: 0.50    Years: 20.00    Pack years: 10.00    Types: Cigarettes    Last attempt to quit: 06/24/2015    Years since quitting: 2.6  . Smokeless tobacco: Never Used  Substance Use Topics  . Alcohol use: Yes  Alcohol/week: 48.0 standard drinks    Types: 48 Cans of beer per week    Comment: 2 40 oz beers per day  . Drug use: Yes    Types: Marijuana, Cocaine    Review of Systems  All other systems negative except as documented in the HPI. All pertinent positives and negatives as reviewed in the HPI. ____________________________________________   PHYSICAL EXAM:  VITAL SIGNS: ED Triage Vitals  Enc Vitals Group     BP 02/08/18 1231 116/73     Pulse Rate 02/08/18 1231 60     Resp 02/08/18 1231 15     Temp 02/08/18 1231 98.2 F (36.8 C)     Temp Source 02/08/18 1231 Oral     SpO2 02/08/18 1231 100 %     Weight --      Height --      Head Circumference --      Peak  Flow --      Pain Score 02/08/18 1242 0     Pain Loc --      Pain Edu? --      Excl. in [employer]? --     Constitutional: Alert and oriented. Well appearing and in no acute distress. Eyes: Conjunctivae are normal. PERRL. EOMI. Head: Atraumatic. Nose: No congestion/rhinnorhea. Mouth/Throat: Mucous membranes are moist.  Oropharynx non-erythematous. Neck: No stridor.  No meningeal signs.   Cardiovascular: Normal rate, regular rhythm. Good peripheral circulation. Grossly normal heart sounds.   Respiratory: Normal respiratory effort.  No retractions. Lungs CTAB. Gastrointestinal: Soft and nontender. No distention.  Musculoskeletal: No lower extremity tenderness nor edema. No gross deformities of extremities. Neurologic:  Normal speech and language. No gross focal neurologic deficits are appreciated.  Skin:  Skin is warm, dry and intact. No rash noted. Psychiatric: Suicidal. AVH. Mood and affect are normal. Speech and behavior are normal.  ____________________________________________   LABS (all labs ordered are listed, but only abnormal results are displayed)  Labs Reviewed  COMPREHENSIVE METABOLIC PANEL - Abnormal; Notable for the following components:      Result Value   Glucose, Bld 103 (*)    All other components within normal limits  ACETAMINOPHEN LEVEL - Abnormal; Notable for the following components:   Acetaminophen (Tylenol), Serum <10 (*)    All other components within normal limits  CBC - Abnormal; Notable for the following components:   HCT 38.8 (*)    All other components within normal limits  RAPID URINE DRUG SCREEN, HOSP PERFORMED - Abnormal; Notable for the following components:   Tetrahydrocannabinol POSITIVE (*)    All other components within normal limits  ETHANOL  SALICYLATE LEVEL   ____________________________________________  EKG   EKG Interpretation  Date/Time:  Wednesday February 08 2018 13:54:18 EDT Ventricular Rate:  50 PR Interval:    QRS  Duration: 87 QT Interval:  412 QTC Calculation: 376 R Axis:   30 Text Interpretation:  Sinus rhythm Anterior infarct, possibly acute ST elevation, consider inferior injury Lateral leads are also involved Baseline wander in lead(s) V2 T waves more prominent than previously, ST changes otherwise consitent.  Confirmed by Merrily Pew [phone]) on 02/08/2018 1:58:07 PM       ____________________________________________  RADIOLOGY  No results found.  ____________________________________________   PROCEDURES  Procedure(s) performed:   Procedures   ____________________________________________   INITIAL IMPRESSION / ASSESSMENT AND PLAN / ED COURSE  Will check labs to ensure he is medically cleared for TTS consultation.  Medically clear. TTS consult.  Pertinent labs & imaging results that were available during my care of the patient were reviewed by me and considered in my medical decision making (see chart for details).  ____________________________________________  FINAL CLINICAL IMPRESSION(S) / ED DIAGNOSES  Final diagnoses:  None     MEDICATIONS GIVEN DURING THIS VISIT:  Medications - No data to display   NEW OUTPATIENT MEDICATIONS STARTED DURING THIS VISIT:  New Prescriptions   No medications on file    Note:  This note was prepared with assistance of Dragon voice recognition software. Occasional wrong-word or sound-a-like substitutions may have occurred due to the inherent limitations of voice recognition software.   Merrily Pew, MD 02/08/18 [phone]

## 2018-02-08 NOTE — ED Notes (Signed)
Pt transported to [facility] by [facility] transportation service for continuation of specialized care. Belongings given to driver after patient signed for them. Pt left in no acute distress. 

## 2018-02-08 NOTE — BH Assessment (Signed)
Assessment Note  [name] [name] is an 52 y.o. male. Pt reports SI with a plan to jump off a bridge or parking garage. Pt states he hears voices telling him to do it. Pt states he is depressed due to SA. Pt states he uses cocaine and alcohol daily. Pt has been hospitalized previously. Pt is not receiving outpatient treatment. Pt is not prescribed mental health medications. Pt states he is ready for treatment because "I'm tired of living like this."  Margarita Grizzle, NP recommends inpatient treatment.  Diagnosis:  F33.2 MDD; polysubstance abuse  Past Medical History:  Past Medical History:  Diagnosis Date  . Abnormal ECG    a. early repolarization  . Arthritis    "my whole left side" (05/03/2014)  . Bipolar disorder ([place])   . Bradycardia    a. asymptomatic  . CAD in native artery    a. Nonobstructive cath 11/2007;  b. Presented with ST elevation - Nonobstructive cath 08/2011  . Chest pain, mid sternal   . Coronary artery disease   . GERD (gastroesophageal reflux disease)   . History of cocaine abuse    a. quit ? 2009  . History of ETOH abuse    a. drinks 2 "40's" / wk  . Marijuana abuse    a. uses ~ 1x /wk or less  . Pneumonia   . Stomach ulcer   . Syncope    a. 12/2010 - presumed to be vasovagal  . Tobacco abuse     Past Surgical History:  Procedure Laterality Date  . CARDIAC CATHETERIZATION  2009; 08/2011   [name] 08/06/2011  . LEFT HEART CATHETERIZATION WITH CORONARY ANGIOGRAM N/A 08/16/2011   Procedure: LEFT HEART CATHETERIZATION WITH CORONARY ANGIOGRAM;  Surgeon: [name], MD;  Location: [facility] CATH LAB;  Service: Cardiovascular;  Laterality: N/A;    Family History: No family history on file.  Social History:  reports that he has been smoking cigarettes. He has a 10.00 pack-year smoking history. He has never used smokeless tobacco. He reports that he drinks about 48.0 standard drinks of alcohol per week. He reports that he has current or past drug history. Drugs: Marijuana  and Cocaine.  Additional Social History:  Alcohol / Drug Use Pain Medications: please see mar Prescriptions: please see mar Over the Counter: please see mar History of alcohol / drug use?: Yes Longest period of sobriety (when/how long): unknown Negative Consequences of Use: Financial, Personal relationships, Work / School Substance #1 Name of Substance 1: cocaine 1 - Age of First Use: (P) unknown 1 - Amount (size/oz): (P) unknown 1 - Frequency: (P) unknown  CIWA: CIWA-Ar BP: 116/73 Pulse Rate: 60 COWS:    Allergies:  Allergies  Allergen Reactions  . Aspirin Other (See Comments)    Aggravates ulcer. "Causes chest pain."  . Pepperoni [Pickled Meat] Other (See Comments)    aggrevates ulcer  . Tomato Other (See Comments)    Foods with tomato sauce aggrevate ulcers  . Tylenol [Acetaminophen] Other (See Comments)    Pt reports hx of ulcers    Home Medications:  (Not in a hospital admission)  OB/GYN Status:  No LMP for male patient.  General Assessment Data [facility] of Assessment: [facility] TTS Assessment: In system Is this a Tele or Face-to-Face Assessment?: Face-to-Face Is this an Initial Assessment or a Re-assessment for this encounter?: Initial Assessment Patient Accompanied by:: N/A Language Other than English: No Living Arrangements: Homeless/Shelter What gender do you identify as?: Male Marital status: Single Maiden name:  NA Pregnancy Status: No Living Arrangements: Other (Comment)(homeless) Can pt return to current living arrangement?: Yes Admission Status: Voluntary Is patient capable of signing voluntary admission?: Yes Referral Source: Self/Family/Friend Insurance type: Medicaid     Crisis Care Plan Living Arrangements: Other (Comment)(homeless) Legal Guardian: Other:(self) Name of Psychiatrist: NA Name of Therapist: NA  Education Status Is patient currently in school?: No Is the patient employed, unemployed or receiving disability?: Receiving  disability income  Risk to self with the past 6 months Suicidal Ideation: Yes-Currently Present Has patient been a risk to self within the past 6 months prior to admission? : No Suicidal Intent: Yes-Currently Present Has patient had any suicidal intent within the past 6 months prior to admission? : No Is patient at risk for suicide?: Yes Suicidal Plan?: Yes-Currently Present Has patient had any suicidal plan within the past 6 months prior to admission? : No Specify Current Suicidal Plan: to jump off a plane or bridge Specify Access to Suicidal Means: access to bridge or parking deck What has been your use of drugs/alcohol within the last 12 months?: cocaine and alcohol Previous Attempts/Gestures: Yes How many times?: 1 Other Self Harm Risks: NA Triggers for Past Attempts: None known Intentional Self Injurious Behavior: None Family Suicide History: No Recent stressful life event(s): Other (Comment)(SA) Persecutory voices/beliefs?: No Depression: Yes Depression Symptoms: Insomnia, Tearfulness, Isolating, Loss of interest in usual pleasures, Feeling worthless/self pity, Feeling angry/irritable Substance abuse history and/or treatment for substance abuse?: Yes Suicide prevention information given to non-admitted patients: Not applicable  Risk to Others within the past 6 months Homicidal Ideation: No Does patient have any lifetime risk of violence toward others beyond the six months prior to admission? : No Thoughts of Harm to Others: No Current Homicidal Intent: No Current Homicidal Plan: No Access to Homicidal Means: No Identified Victim: NA History of harm to others?: No Assessment of Violence: None Noted Violent Behavior Description: NA Does patient have access to weapons?: No Criminal Charges Pending?: No Does patient have a court date: No Is patient on probation?: No  Psychosis Hallucinations: Visual Delusions: None noted  Mental Status Report Appearance/Hygiene:  Unremarkable Eye Contact: Fair Motor Activity: Freedom of movement Speech: Logical/coherent Level of Consciousness: Alert Mood: Sad Affect: Sad Anxiety Level: Minimal Thought Processes: Coherent, Relevant Judgement: Unimpaired Orientation: Person, Place, Time, Situation Obsessive Compulsive Thoughts/Behaviors: None  Cognitive Functioning Concentration: Normal Memory: Recent Intact, Remote Intact Is patient IDD: No Insight: Poor Impulse Control: Poor Appetite: Fair Have you had any weight changes? : No Change Sleep: No Change Total Hours of Sleep: 7 Vegetative Symptoms: None  ADLScreening [facility] Assessment Services) Patient's cognitive ability adequate to safely complete daily activities?: Yes Patient able to express need for assistance with ADLs?: Yes Independently performs ADLs?: Yes (appropriate for developmental age)  Prior Inpatient Therapy Prior Inpatient Therapy: Yes Prior Therapy Dates: multiple Prior Therapy Facilty/Provider(s): [facility], [facility] Reason for Treatment: SI and SA  Prior Outpatient Therapy Prior Outpatient Therapy: No Does patient have an ACCT team?: No Does patient have Intensive In-House Services?  : No Does patient have [facility] services? : No Does patient have P4CC services?: No  ADL Screening (condition at time of admission) Patient's cognitive ability adequate to safely complete daily activities?: Yes Is the patient deaf or have difficulty hearing?: No Does the patient have difficulty seeing, even when wearing glasses/contacts?: No Does the patient have difficulty concentrating, remembering, or making decisions?: No Patient able to express need for assistance with ADLs?: Yes Does the patient  have difficulty dressing or bathing?: No Independently performs ADLs?: Yes (appropriate for developmental age) Does the patient have difficulty walking or climbing stairs?: No Weakness of Legs: None Weakness of Arms/Hands: None        Abuse/Neglect Assessment (Assessment to be complete while patient is alone) Abuse/Neglect Assessment Can Be Completed: Yes Physical Abuse: Denies Verbal Abuse: Denies Sexual Abuse: Denies Exploitation of patient/patient's resources: Denies     Advance Directives (For Healthcare) Does Patient Have a Medical Advance Directive?: No Would patient like information on creating a medical advance directive?: No - Patient declined          Disposition:  Disposition Initial Assessment Completed for this Encounter: Yes Disposition of Patient: Admit Type of inpatient treatment program: Adult  On Site Evaluation by:   Reviewed with Physician:    Lorenza Cambridge D 02/08/2018 4:05 PM

## 2018-02-08 NOTE — Tx Team (Signed)
Initial Treatment Plan 02/08/2018 11:15 PM [name] [name] [id number]    PATIENT STRESSORS: Financial difficulties Medication change or noncompliance Substance abuse   PATIENT STRENGTHS: Ability for insight Average or above average intelligence Capable of independent living General fund of knowledge   PATIENT IDENTIFIED PROBLEMS: Depression Suicidal thoughts Substance Abuse "I need to get back on my medications" "I want to feel normal again"                     DISCHARGE CRITERIA:  Ability to meet basic life and health needs Improved stabilization in mood, thinking, and/or behavior Reduction of life-threatening or endangering symptoms to within safe limits Verbal commitment to aftercare and medication compliance Withdrawal symptoms are absent or subacute and managed without 24-hour nursing intervention  PRELIMINARY DISCHARGE PLAN: Attend aftercare/continuing care group  PATIENT/FAMILY INVOLVEMENT: This treatment plan has been presented to and reviewed with the patient, [name] [name], and/or family member, .  The patient and family have been given the opportunity to ask questions and make suggestions.  [name] [name], [name], [place] 02/08/2018, 11:15 PM

## 2018-02-08 NOTE — ED Notes (Signed)
Bed: [facility] Expected date:  Expected time:  Means of arrival:  Comments: Hold for triage 4 

## 2018-02-08 NOTE — ED Notes (Signed)
Medication not given due to patient being discharged to [facility].

## 2018-02-08 NOTE — ED Notes (Signed)
Pt reports SI with a plan to jump off a bridge or parking garage. Pt states he hears voices telling him to do it. Patient denies HI/VH at this time. Plan of care discussed. Encouragement and support provided and safety maintain. Q 15 min safety checks remain in place and video monitoring.

## 2018-02-08 NOTE — ED Notes (Signed)
Bed: [facility] Expected date:  Expected time:  Means of arrival:  Comments: 

## 2018-02-08 NOTE — Progress Notes (Signed)
VOL paperwork signed and faxed to [facility] for review.  Lind Covert, MSW, LCSW Therapeutic Triage Specialist  [phone]

## 2018-02-08 NOTE — Progress Notes (Signed)
[name] [name] is a 52 year old male pt admitted on voluntary basis. On admission he endorses depression, passive SI and auditory hallucinations but reports he has no current SI plan and is able to contract for safety while in the hospital. He reports that he has been abusing alcohol on a daily basis and has also been using crack cocaine on a semi-regular basis. He reports that he is supposed to be on medications but reports that he has not taken any in awhile. He reports that he is currently homeless and reports that he is unsure where he will go once he is discharged but expressed interest in going to a long-term treatment facility. [name] [name] was escorted to the unit, oriented to the milieu and safety maintained.

## 2018-02-08 NOTE — ED Notes (Signed)
On admission to the Acute Unit pt went straight to bed and to sleep.

## 2018-02-09 DIAGNOSIS — Z811 Family history of alcohol abuse and dependence: Secondary | ICD-10-CM

## 2018-02-09 DIAGNOSIS — Z818 Family history of other mental and behavioral disorders: Secondary | ICD-10-CM

## 2018-02-09 DIAGNOSIS — F1024 Alcohol dependence with alcohol-induced mood disorder: Secondary | ICD-10-CM

## 2018-02-09 MED ORDER — THIAMINE HCL 100 MG/ML IJ SOLN
100.0000 mg | Freq: Once | INTRAMUSCULAR | Status: AC
  Administered 2018-02-09: 100 mg via INTRAMUSCULAR
  Filled 2018-02-09: qty 2

## 2018-02-09 MED ORDER — CHLORDIAZEPOXIDE HCL 25 MG PO CAPS: 25.0000 mg | ORAL_CAPSULE | Freq: Four times a day (QID) | ORAL | Status: DC | PRN

## 2018-02-09 MED ORDER — ONDANSETRON 4 MG PO TBDP
4.0000 mg | ORAL_TABLET | Freq: Four times a day (QID) | ORAL | Status: DC | PRN
  Administered 2018-02-10 (×2): 4 mg via ORAL
  Filled 2018-02-09 (×2): qty 1

## 2018-02-09 MED ORDER — LOPERAMIDE HCL 2 MG PO CAPS: 2.0000 mg | ORAL_CAPSULE | ORAL | Status: DC | PRN

## 2018-02-09 MED ORDER — ARIPIPRAZOLE 2 MG PO TABS
2.0000 mg | ORAL_TABLET | Freq: Every day | ORAL | Status: DC
  Administered 2018-02-09 – 2018-02-11 (×3): 2 mg via ORAL
  Filled 2018-02-09 (×6): qty 1

## 2018-02-09 MED ORDER — VITAMIN B-1 100 MG PO TABS
100.0000 mg | ORAL_TABLET | Freq: Every day | ORAL | Status: DC
  Administered 2018-02-10 – 2018-02-14 (×4): 100 mg via ORAL
  Filled 2018-02-09 (×7): qty 1

## 2018-02-09 MED ORDER — ADULT MULTIVITAMIN W/MINERALS CH
1.0000 | ORAL_TABLET | Freq: Every day | ORAL | Status: DC
  Administered 2018-02-10 – 2018-02-14 (×4): 1 via ORAL
  Filled 2018-02-09 (×8): qty 1

## 2018-02-09 MED ORDER — HYDROXYZINE HCL 25 MG PO TABS
25.0000 mg | ORAL_TABLET | Freq: Four times a day (QID) | ORAL | Status: DC | PRN
  Administered 2018-02-11: 25 mg via ORAL
  Filled 2018-02-09: qty 1

## 2018-02-09 MED ORDER — SERTRALINE HCL 50 MG PO TABS
50.0000 mg | ORAL_TABLET | Freq: Every day | ORAL | Status: DC
  Administered 2018-02-10 – 2018-02-11 (×2): 50 mg via ORAL
  Filled 2018-02-09 (×5): qty 1
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
## 2018-02-09 NOTE — H&P (Signed)
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 MDD (major depressive disorder), recurrent severe, without psychosis ([place]) [F33.2] 02/08/2018  . Major depressive disorder, recurrent episode, severe with anxious distress ([place]) [F33.2] 05/23/2016  . PTSD (post-traumatic stress disorder) [F43.10] 05/23/2016  . Cocaine use disorder, moderate, in early remission ([place]) [F14.21] 05/23/2016  . Alcohol use disorder, moderate, dependence ([place]) [F10.20] 05/23/2016  . Affective psychosis, bipolar ([place]) [F31.9] 04/30/2016  . Cocaine abuse with cocaine-induced mood disorder ([place]) [F14.14] 03/04/2016  . Alcohol dependence with uncomplicated withdrawal ([place]) [F10.230]   . Alcohol use disorder, severe, dependence ([place]) [F10.20] 01/18/2015  . Alcohol dependence with alcohol-induced mood disorder ([place]) [F10.24]   . Suicidal ideation [R45.851]   . Acute pancreatitis [K85.90] 03/20/2012  . Polysubstance abuse ([place]) [F19.10] 03/20/2012  . Cannabis abuse [F12.10] 01/12/2012  . Alcohol dependence ([place]) [F10.20] 10/15/2011  . Abnormal ECG [R94.31]   . Chest pain [R07.89]   . CAD in native artery [I25.10]   . GERD (gastroesophageal reflux disease) [K21.9]   . Tobacco abuse [Z72.0]   . History of cocaine abuse [Z87.898]   . History of ETOH abuse [Z87.898]   . Bradycardia [R00.1]   . Syncope [R55]    History of Present Illness: 52 year old male . Reports he went to [facility], and was referred to [facility] ED. Presented voluntarily. Reports he has been feeling depressed over recent months, but feels his mood has worsened over recent days. He reports he has been experiencing suicidal ideations,with  thoughts of jumping off a bridge. He reports history of alcohol use disorder, and has been drinking up to 5 large ( 40 ounce) beers on most days. He states he has been blacking out frequently . Of note, admission BAL negative, admission UDS positive for cannabis. Attributes depression to death of his GF , who passed away 07-23-22.  Endorses neuro-vegetative symptoms of depression. Reports auditory hallucinations ( only when actively drinking) which tell him " different things, like " go speak to that person"   Associated Signs/Symptoms: Depression Symptoms:  depressed mood, anhedonia, insomnia, suicidal thoughts with specific plan, loss of energy/fatigue, decreased appetite, (Hypo) Manic Symptoms:  None noted or endorsed  Anxiety Symptoms:  Reports some increased anxiety, worry  Psychotic Symptoms: reports auditory hallucinations which occur in the context of alcohol intoxication. PTSD Symptoms: Does not endorse PTSD symptoms Total Time spent with patient: 45 minutes  Past Psychiatric History: patient has history of prior psychiatric admissions , for depression.  In the past has been diagnosed with Bipolar Disorder - endorses history of depression, even when sober, at this time does not endorse clear history of mania or hypomania. Reports auditory hallucinations over the last few months, describes them as only occurring during periods of alcohol intoxication He was admitted here at [facility] in 2017 for depression and relapse on alcohol . History of  2 suicide attempts many years ago ( in the 2s) by walking in front of traffic and by overdosing .   Is the patient at risk to self? Yes.    Has the patient been a risk to self in the past 6 months? Yes.    Has the patient been a risk to self within the distant past? Yes.  Is the patient a risk to others? No.  Has the patient been a risk to others in the past 6 months? No.  Has the patient been a risk to others within the distant past? No.    Prior Inpatient Therapy:  as above  Prior Outpatient Therapy:  no current outpatient treatment   Alcohol Screening: 1. How often do you have a drink containing alcohol?: 4 or more times a week 2. How many drinks containing alcohol do you have on a typical day when you are drinking?: 10 or more 3. How often do you have six or more drinks on one occasion?: Daily or almost daily AUDIT-C Score: 12 4. How often during the last year have you found that you were not able to stop drinking once you had started?: Daily or almost daily 5. How often during the last year have you failed to do what was normally expected from you becasue of drinking?: Daily or almost daily 6. How often during the last year have you needed a first drink in the morning to get yourself going after a heavy drinking session?: Daily or almost daily 7. How often during the last year have you had a feeling of guilt of remorse after drinking?: Weekly 8. How often during the last year have you been unable to remember what happened the night before because you had been drinking?: Weekly 9. Have you or someone else been injured as a result of your drinking?: Yes, during the last year 10. Has a relative or friend or a doctor or another health worker been concerned about your drinking or suggested you cut down?: Yes, during the last year Alcohol Use Disorder Identification Test Final Score (AUDIT): 38 Intervention/Follow-up: Alcohol Education Substance Abuse History in the last 12 months:  Reports history of alcohol dependence, and states he has been drinking daily/heavily. He also reports daily cannabis abuse. Reports using cocaine 1-2 x per month.  Consequences of Substance Abuse: (+) blackouts , denies history of seizures, denies history of DTs,  Previous Psychotropic Medications: states he has not taken any psychiatric medications x 2 years . He was not taking any medications prior to admission. He was discharged on Zoloft and  Trazodone after his 2017 admission. States Zoloft was helpful/effective " I felt a lot better with it". History of poor tolerance to Seroquel.  Psychological Evaluations: No  Past Medical History: history of GERD,history of CAD Past Medical History:  Diagnosis Date  . Abnormal ECG    a. early repolarization  . Arthritis    "my whole left side" (05/03/2014)  . Bipolar disorder ([place])   . Bradycardia    a. asymptomatic  . CAD in native artery    a. Nonobstructive cath 11/2007;  b. Presented with ST elevation - Nonobstructive cath 08/2011  . Chest pain, mid sternal   . Coronary artery disease   . GERD (gastroesophageal reflux disease)   . History of cocaine abuse    a. quit ? 2009  . History of ETOH abuse    a. drinks 2 "40's" / wk  . Marijuana abuse    a. uses ~ 1x /wk or less  . Pneumonia   . Stomach ulcer   . Syncope    a. 12/2010 - presumed to be vasovagal  . Tobacco abuse     Past Surgical History:  Procedure Laterality Date  . CARDIAC CATHETERIZATION  2009; 08/2011   [name] 08/06/2011  . LEFT HEART CATHETERIZATION WITH CORONARY ANGIOGRAM N/A 08/16/2011  Procedure: LEFT HEART CATHETERIZATION WITH CORONARY ANGIOGRAM;  Surgeon: [name], MD;  Location: [facility] CATH LAB;  Service: Cardiovascular;  Laterality: N/A;   Family History: parents alive, live together, has 2 brothers,4 sisters. One sister passed away from brain aneurysm, and another brother was murdered in [place]  History: reports two sisters have history of depression, no suicide attempts in family, reports parents have history of alcohol use disorder, but are now sober. Tobacco Screening:  Smokes 1 PPD  Social History: 76, single, has 7 children ( states all adults ), states he lives with his parents and sometimes with one of his adult children, on disability. Denies legal issues . Social History   Substance and Sexual Activity  Alcohol Use Yes  . Alcohol/week: 48.0 standard drinks  . Types: 48  Cans of beer per week   Comment: 2 40 oz beers per day     Social History   Substance and Sexual Activity  Drug Use Yes  . Types: Marijuana, Cocaine    Additional Social History:  Allergies:   Allergies  Allergen Reactions  . Aspirin Other (See Comments)    Aggravates ulcer. "Causes chest pain."  . Pepperoni [Pickled Meat] Other (See Comments)    aggrevates ulcer  . Tomato Other (See Comments)    Foods with tomato sauce aggrevate ulcers  . Tylenol [Acetaminophen] Other (See Comments)    Pt reports hx of ulcers   Lab Results:  Results for orders placed or performed during the hospital encounter of 02/08/18 (from the past 48 hour(s))  Rapid urine drug screen (hospital performed)     Status: Abnormal   Collection Time: 02/08/18 12:44 PM  Result Value Ref Range   Opiates NONE DETECTED NONE DETECTED   Cocaine NONE DETECTED NONE DETECTED   Benzodiazepines NONE DETECTED NONE DETECTED   Amphetamines NONE DETECTED NONE DETECTED   Tetrahydrocannabinol POSITIVE (A) NONE DETECTED   Barbiturates NONE DETECTED NONE DETECTED    Comment: (NOTE) DRUG SCREEN FOR MEDICAL PURPOSES ONLY.  IF CONFIRMATION IS NEEDED FOR ANY PURPOSE, NOTIFY LAB WITHIN 5 DAYS. LOWEST DETECTABLE LIMITS FOR URINE DRUG SCREEN Drug Class                     Cutoff (ng/mL) Amphetamine and metabolites    1000 Barbiturate and metabolites    200 Benzodiazepine                 003 Tricyclics and metabolites     300 Opiates and metabolites        300 Cocaine and metabolites        300 THC                            50 Performed at [facility], [place] [address], [place], [place] [address]   Comprehensive metabolic panel     Status: Abnormal   Collection Time: 02/08/18  1:37 PM  Result Value Ref Range   Sodium 140 135 - 145 mmol/L   Potassium 4.2 3.5 - 5.1 mmol/L   Chloride 106 98 - 111 mmol/L   CO2 26 22 - 32 mmol/L   Glucose, Bld 103 (H) 70 - 99 mg/dL   BUN 15 6 - 20 mg/dL   Creatinine,  Ser 1.06 0.61 - 1.24 mg/dL   Calcium 9.4 8.9 - 10.3 mg/dL   Total Protein 7.3 6.5 - 8.1 g/dL   Albumin 4.1 3.5 -  5.0 g/dL   AST 29 15 - 41 U/L   ALT 25 0 - 44 U/L   Alkaline Phosphatase 70 38 - 126 U/L   Total Bilirubin 0.7 0.3 - 1.2 mg/dL   GFR calc non Af Amer >60 >60 mL/min   GFR calc Af Amer >60 >60 mL/min    Comment: (NOTE) The eGFR has been calculated using the CKD EPI equation. This calculation has not been validated in all clinical situations. eGFR's persistently <60 mL/min signify possible Chronic Kidney Disease.    Anion gap 8 5 - 15    Comment: Performed at [facility], [place] [address], [place], [place] [address]  Ethanol     Status: None   Collection Time: 02/08/18  1:37 PM  Result Value Ref Range   Alcohol, Ethyl (B) <10 <10 mg/dL    Comment: (NOTE) Lowest detectable limit for serum alcohol is 10 mg/dL. For medical purposes only. Performed at [facility], [place] [address] [place], [place] [address]   Salicylate level     Status: None   Collection Time: 02/08/18  1:37 PM  Result Value Ref Range   Salicylate Lvl <0.1 2.8 - 30.0 mg/dL    Comment: Performed at [facility], [place] [address] [place], [place] [address]  Acetaminophen level     Status: Abnormal   Collection Time: 02/08/18  1:37 PM  Result Value Ref Range   Acetaminophen (Tylenol), Serum <10 (L) 10 - 30 ug/mL    Comment: (NOTE) Therapeutic concentrations vary significantly. A range of 10-30 ug/mL  may be an effective concentration for many patients. However, some  are best treated at concentrations outside of this range. Acetaminophen concentrations >150 ug/mL at 4 hours after ingestion  and >50 ug/mL at 12 hours after ingestion are often associated with  toxic reactions. Performed at [facility], [place] [address], [place], [place] [address]   cbc     Status: Abnormal   Collection Time: 02/08/18  1:37 PM  Result Value Ref  Range   WBC 4.1 4.0 - 10.5 K/uL   RBC 4.28 4.22 - 5.81 MIL/uL   Hemoglobin 13.6 13.0 - 17.0 g/dL   HCT 38.8 (L) 39.0 - 52.0 %   MCV 90.7 78.0 - 100.0 fL   MCH 31.8 26.0 - 34.0 pg   MCHC 35.1 30.0 - 36.0 g/dL   RDW 13.0 11.5 - 15.5 %   Platelets 254 150 - 400 K/uL    Comment: Performed at [facility], [place] [address], [place],  [address]    Blood Alcohol level:  Lab Results  Component Value Date   ETH <10 02/08/2018   ETH 168 (H) 25/42/7062    Metabolic Disorder Labs:  Lab Results  Component Value Date   HGBA1C 4.9 05/04/2016   MPG 94 05/04/2016   MPG 82 05/03/2014   No results found for: PROLACTIN Lab Results  Component Value Date   CHOL 170 05/04/2016   TRIG 155 (H) 05/04/2016   HDL 65 05/04/2016   CHOLHDL 2.6 05/04/2016   VLDL 31 05/04/2016   LDLCALC 74 05/04/2016   LDLCALC 42 05/03/2014    Current Medications: Current Facility-Administered Medications  Medication Dose Route Frequency Provider Last Rate Last Dose  . alum & mag hydroxide-simeth (MAALOX/MYLANTA) 200-200-20 MG/5ML suspension 30 mL  30 mL Oral Q4H PRN Ethelene Hal, NP      . hydrOXYzine (ATARAX/VISTARIL) tablet 25 mg  25 mg Oral TID  PRN Ethelene Hal, NP   25 mg at 02/08/18 2253  . hydrOXYzine (ATARAX/VISTARIL) tablet 25 mg  25 mg Oral TID Ethelene Hal, NP   25 mg at 02/09/18 0847  . magnesium hydroxide (MILK OF MAGNESIA) suspension 30 mL  30 mL Oral Daily PRN Ethelene Hal, NP      . sertraline (ZOLOFT) tablet 25 mg  25 mg Oral Daily Ethelene Hal, NP   25 mg at 02/09/18 0847  . traZODone (DESYREL) tablet 50 mg  50 mg Oral QHS PRN Ethelene Hal, NP   50 mg at 02/08/18 2253  . traZODone (DESYREL) tablet 50 mg  50 mg Oral QHS Ethelene Hal, NP       PTA Medications: No medications prior to admission.    Musculoskeletal: Strength & Muscle Tone: within normal limits- no current psychomotor agitation or restlessness   Gait & Station: normal Patient leans: N/A  Psychiatric Specialty Exam: Physical Exam  Review of Systems  Constitutional: Negative for chills and fever.  HENT: Negative.   Eyes: Negative.   Respiratory: Negative.   Cardiovascular: Negative.   Gastrointestinal: Positive for nausea. Negative for diarrhea and vomiting.  Genitourinary: Negative.   Musculoskeletal: Negative.   Skin: Negative.   Neurological: Negative for seizures.  Endo/Heme/Allergies: Negative.   Psychiatric/Behavioral: Positive for depression, hallucinations and substance abuse.  All other systems reviewed and are negative.   Blood pressure 122/78, pulse (!) 46, temperature 98 F (36.7 C), temperature source Oral, resp. rate 18, height 5' 4.5" (1.638 m), weight 52.2 kg, SpO2 100 %.Body mass index is 19.43 kg/m.  General Appearance: Fairly Groomed  Eye Contact:  Fair  Speech:  Normal Rate  Volume:  Normal  Mood:  Depressed  Affect:  constricted, sad, does smile briefly at times   Thought Process:  Linear and Descriptions of Associations: Intact  Orientation:  Full (Time, Place, and Person)  Thought Content:  Reports recent auditory hallucinations, during periods of intoxication, last heard voices about 5 days ago, currently not internally preoccupied, no delusions expressed   Suicidal Thoughts:  No denies suicidal or self injurious ideations, contracts for safety on unit, states " I am here to get help", denies homicidal ideations  Homicidal Thoughts:  No  Memory:  recent and remote grossly intact   Judgement:  Fair  Insight:  Fair  Psychomotor Activity:  Decreased- minimal distal tremors, no restlessness or agitation  Concentration:  Concentration: Good and Attention Span: Good  Recall:  Good  Fund of Knowledge:  Good  Language:  Good  Akathisia:  Negative  Handed:  Right  AIMS (if indicated):     Assets:  Desire for Improvement Resilience  ADL's:  Fair   Cognition:  WNL  Sleep:  Number of Hours: 6.75     Treatment Plan Summary: Daily contact with patient to assess and evaluate symptoms and progress in treatment, Medication management, Plan inpatient treatment  and medications as below  Observation Level/Precautions:  15 minute checks  Laboratory:  As needed   Psychotherapy:  Milieu, group therapy   Medications:  Start Librium detox protocol (PRN) to address potential alcohol WDL. Start Zoloft at 43 mgrs QDAY for depression- history of good response and tolerance to this antidepressant in the past . Start Abilify ( low dose initially) as antidepressant augmentation and to address hallucinations   Consultations:  As needed   Discharge Concerns:    Estimated LOS:  Other:     Physician Treatment Plan for  Primary Diagnosis:  Alcohol Induced Mood Disorder ( Depressed) versus MDD Long Term Goal(s): Improvement in symptoms so as ready for discharge  Short Term Goals: Ability to identify changes in lifestyle to reduce recurrence of condition will improve and Ability to maintain clinical measurements within normal limits will improve  Physician Treatment Plan for Secondary Diagnosis: Alcohol Use Disorder, Cannabis Use Disorder Long Term Goal(s): Improvement in symptoms so as ready for discharge  Short Term Goals: Ability to identify triggers associated with substance abuse/mental health issues will improve  I certify that inpatient services furnished can reasonably be expected to improve the patient's condition.    Jenne Campus, MD 9/5/201911:03 AM

## 2018-02-09 NOTE — [facility] Suicide Risk Assessment (Signed)
[facility] Admission Suicide Risk Assessment   Nursing information obtained from:  Patient Demographic factors:  Male, Low socioeconomic status, Living alone, Unemployed Current Mental Status:  Suicidal ideation indicated by patient, Self-harm thoughts Loss Factors:  Financial problems / change in socioeconomic status Historical Factors:  Prior suicide attempts, Family history of mental illness or substance abuse Risk Reduction Factors:  Positive coping skills or problem solving skills  Total Time spent with patient: 45 minutes Principal Problem:  Alcohol Use Disorder, Cannabis Use Disorder, Alcohol induced Mood Disorder versus MDD Diagnosis:   Patient Active Problem List   Diagnosis Date Noted  . MDD (major depressive disorder), recurrent severe, without psychosis ([place]) [F33.2] 02/08/2018  . Major depressive disorder, recurrent episode, severe with anxious distress ([place]) [F33.2] 05/23/2016  . PTSD (post-traumatic stress disorder) [F43.10] 05/23/2016  . Cocaine use disorder, moderate, in early remission ([place]) [F14.21] 05/23/2016  . Alcohol use disorder, moderate, dependence ([place]) [F10.20] 05/23/2016  . Affective psychosis, bipolar ([place]) [F31.9] 04/30/2016  . Cocaine abuse with cocaine-induced mood disorder ([place]) [F14.14] 03/04/2016  . Alcohol dependence with uncomplicated withdrawal ([place]) [F10.230]   . Alcohol use disorder, severe, dependence ([place]) [F10.20] 01/18/2015  . Alcohol dependence with alcohol-induced mood disorder ([place]) [F10.24]   . Suicidal ideation [R45.851]   . Acute pancreatitis [K85.90] 03/20/2012  . Polysubstance abuse ([place]) [F19.10] 03/20/2012  . Cannabis abuse [F12.10] 01/12/2012  . Alcohol dependence ([place]) [F10.20] 10/15/2011  . Abnormal ECG [R94.31]   . Chest pain [R07.89]   . CAD in native artery [I25.10]   . GERD (gastroesophageal reflux disease) [K21.9]   . Tobacco abuse [Z72.0]   . History of cocaine abuse [Z87.898]   . History of ETOH abuse [Z87.898]   .  Bradycardia [R00.1]   . Syncope [R55]    Subjective Data:  Continued Clinical Symptoms:  Alcohol Use Disorder Identification Test Final Score (AUDIT): 38 The "Alcohol Use Disorders Identification Test", Guidelines for Use in Primary Care, Second Edition.  World Pharmacologist [facility]). Score between 0-7:  no or low risk or alcohol related problems. Score between 8-15:  moderate risk of alcohol related problems. Score between 16-19:  high risk of alcohol related problems. Score 20 or above:  warrants further diagnostic evaluation for alcohol dependence and treatment.   CLINICAL FACTORS:  52 year old male, presented to ED due to worsening depression, suicidal ideations of jumping off a bridge. Reports he has been depressed for several months after death of GF. Reports daily , heavy drinking and daily cannabis use .   Psychiatric Specialty Exam: Physical Exam  ROS  Blood pressure 122/78, pulse (!) 46, temperature 98 F (36.7 C), temperature source Oral, resp. rate 18, height 5' 4.5" (1.638 m), weight 52.2 kg, SpO2 100 %.Body mass index is 19.43 kg/m.  See admit note MSE                                                        COGNITIVE FEATURES THAT CONTRIBUTE TO RISK:  Closed-mindedness and Loss of executive function    SUICIDE RISK:   Moderate:  Frequent suicidal ideation with limited intensity, and duration, some specificity in terms of plans, no associated intent, good self-control, limited dysphoria/symptomatology, some risk factors present, and identifiable protective factors, including available and accessible social support.  PLAN OF CARE: Patient will be admitted  to inpatient psychiatric unit for stabilization and safety. Will provide and encourage milieu participation. Provide medication management and maked adjustments as needed. Will aslo provide medication management to address potential alcohol WDL.  Will follow daily.    I certify that  inpatient services furnished can reasonably be expected to improve the patient's condition.   Jenne Campus, MD 02/09/2018, 11:37 AM

## 2018-02-09 NOTE — Progress Notes (Signed)
Psychoeducational Group Note  Date:  02/09/2018 Time:  2100  Group Topic/Focus:  wrap up group  Participation Level: Did Not Attend  Participation Quality:  Not Applicable  Affect:  Not Applicable  Cognitive:  Not Applicable  Insight:  Not Applicable  Engagement in Group: Not Applicable  Additional Comments:  Pt was notified that group was beginning but remained in bed.   Shellia Cleverly 02/09/2018, 9:50 PM
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
## 2018-02-10 DIAGNOSIS — Z7289 Other problems related to lifestyle: Secondary | ICD-10-CM

## 2018-02-10 DIAGNOSIS — F17210 Nicotine dependence, cigarettes, uncomplicated: Secondary | ICD-10-CM

## 2018-02-10 LAB — GLUCOSE, CAPILLARY: GLUCOSE-CAPILLARY: 87 mg/dL (ref 70–99)

## 2018-02-10 LAB — TSH: TSH: 0.651 u[IU]/mL (ref 0.350–4.500)

## 2018-02-10 NOTE — Tx Team (Signed)
Interdisciplinary Treatment and Diagnostic Plan Update  02/10/2018 Time of Session: 10:30am [name] [name] MRN: [id number]  Principal Diagnosis: <principal problem not specified>  Secondary Diagnoses: Active Problems:   MDD (major depressive disorder), recurrent severe, without psychosis ([place])   Current Medications:  Current Facility-Administered Medications  Medication Dose Route Frequency Provider Last Rate Last Dose  . alum & mag hydroxide-simeth (MAALOX/MYLANTA) 200-200-20 MG/5ML suspension 30 mL  30 mL Oral Q4H PRN Ethelene Hal, NP      . ARIPiprazole (ABILIFY) tablet 2 mg  2 mg Oral Daily Cobos, Myer Peer, MD   2 mg at 02/10/18 0842  . chlordiazePOXIDE (LIBRIUM) capsule 25 mg  25 mg Oral Q6H PRN Cobos, Myer Peer, MD      . hydrOXYzine (ATARAX/VISTARIL) tablet 25 mg  25 mg Oral Q6H PRN Cobos, Myer Peer, MD      . loperamide (IMODIUM) capsule 2-4 mg  2-4 mg Oral PRN Cobos, Myer Peer, MD      . magnesium hydroxide (MILK OF MAGNESIA) suspension 30 mL  30 mL Oral Daily PRN Ethelene Hal, NP      . multivitamin with minerals tablet 1 tablet  1 tablet Oral Daily Cobos, Myer Peer, MD   1 tablet at 02/10/18 [phone]  . ondansetron (ZOFRAN-ODT) disintegrating tablet 4 mg  4 mg Oral Q6H PRN Cobos, Myer Peer, MD   4 mg at 02/10/18 0844  . sertraline (ZOLOFT) tablet 50 mg  50 mg Oral Daily Cobos, Myer Peer, MD   50 mg at 02/10/18 0842  . thiamine (VITAMIN B-1) tablet 100 mg  100 mg Oral Daily Cobos, Myer Peer, MD   100 mg at 02/10/18 0842  . traZODone (DESYREL) tablet 50 mg  50 mg Oral QHS Ethelene Hal, NP       PTA Medications: No medications prior to admission.    Patient Stressors: Financial difficulties Medication change or noncompliance Substance abuse  Patient Strengths: Ability for insight Average or above average intelligence Capable of independent living General fund of knowledge  Treatment Modalities: Medication Management, Group therapy, Case  management,  1 to 1 session with clinician, Psychoeducation, Recreational therapy.   Physician Treatment Plan for Primary Diagnosis: <principal problem not specified> Long Term Goal(s): Improvement in symptoms so as ready for discharge Improvement in symptoms so as ready for discharge   Short Term Goals: Ability to identify changes in lifestyle to reduce recurrence of condition will improve Ability to maintain clinical measurements within normal limits will improve Ability to identify triggers associated with substance abuse/mental health issues will improve  Medication Management: Evaluate patient's response, side effects, and tolerance of medication regimen.  Therapeutic Interventions: 1 to 1 sessions, Unit Group sessions and Medication administration.  Evaluation of Outcomes: Not Met  Physician Treatment Plan for Secondary Diagnosis: Active Problems:   MDD (major depressive disorder), recurrent severe, without psychosis ([place])  Long Term Goal(s): Improvement in symptoms so as ready for discharge Improvement in symptoms so as ready for discharge   Short Term Goals: Ability to identify changes in lifestyle to reduce recurrence of condition will improve Ability to maintain clinical measurements within normal limits will improve Ability to identify triggers associated with substance abuse/mental health issues will improve     Medication Management: Evaluate patient's response, side effects, and tolerance of medication regimen.  Therapeutic Interventions: 1 to 1 sessions, Unit Group sessions and Medication administration.  Evaluation of Outcomes: Not Met   RN Treatment Plan for Primary Diagnosis: <principal problem not specified>  Long Term Goal(s): Knowledge of disease and therapeutic regimen to maintain health will improve  Short Term Goals: Ability to disclose and discuss suicidal ideas, Ability to identify and develop effective coping behaviors will improve and Compliance with  prescribed medications will improve  Medication Management: RN will administer medications as ordered by provider, will assess and evaluate patient's response and provide education to patient for prescribed medication. RN will report any adverse and/or side effects to prescribing provider.  Therapeutic Interventions: 1 on 1 counseling sessions, Psychoeducation, Medication administration, Evaluate responses to treatment, Monitor vital signs and CBGs as ordered, Perform/monitor CIWA, COWS, AIMS and Fall Risk screenings as ordered, Perform wound care treatments as ordered.  Evaluation of Outcomes: Not Met   LCSW Treatment Plan for Primary Diagnosis: <principal problem not specified> Long Term Goal(s): Safe transition to appropriate next level of care at discharge, Engage patient in therapeutic group addressing interpersonal concerns.  Short Term Goals: Engage patient in aftercare planning with referrals and resources and Increase skills for wellness and recovery  Therapeutic Interventions: Assess for all discharge needs, 1 to 1 time with Social worker, Explore available resources and support systems, Assess for adequacy in community support network, Educate family and significant other(s) on suicide prevention, Complete Psychosocial Assessment, Interpersonal group therapy.  Evaluation of Outcomes: Not Met   Progress in Treatment: Attending groups: No. Participating in groups: No. Taking medication as prescribed: Yes. Toleration medication: Yes. Family/Significant other contact made: No, will contact:  if patient consents Patient understands diagnosis: Yes. Discussing patient identified problems/goals with staff: Yes. Medical problems stabilized or resolved: Yes. Denies suicidal/homicidal ideation: Yes. Issues/concerns per patient self-inventory: No. Other:   New problem(s) identified: None  New Short Term/Long Term Goal(s):medication stabilization, elimination of SI thoughts,  development of comprehensive mental wellness plan.    Patient Goals:  I need to get back on my medications and I want to feel normal again  Discharge Plan or Barriers: CSW will assess for an appropriate discharge plan.   Reason for Continuation of Hospitalization: Depression Medication stabilization Suicidal ideation  Estimated Length of Stay:3-5 days   Attendees: Patient: 02/10/2018 3:21 PM  Physician: Dr. Neita Garnet, MD 02/10/2018 3:21 PM  Nursing: Barbie Banner, RN 02/10/2018 3:21 PM  RN Care Manager: Rhunette Croft 02/10/2018 3:21 PM  Social Worker: Radonna Ricker, [place] 02/10/2018 3:21 PM  Recreational Therapist: Rhunette Croft 02/10/2018 3:21 PM  Other: Rhunette Croft 02/10/2018 3:21 PM  Other: X 02/10/2018 3:21 PM  Other:X 02/10/2018 3:21 PM    Scribe for Treatment Team: Marylee Floras, [place] 02/10/2018 3:21 PM

## 2018-02-10 NOTE — Plan of Care (Signed)
  Problem: Safety: Goal: Periods of time without injury will increase Outcome: Progressing Note:  Pt has not harmed self or others tonight.  He denies SI/HI and verbally contracts for safety.   

## 2018-02-10 NOTE — Progress Notes (Signed)
Patient ID: [name] [name], male   DOB: [birth year], 52 y.o.   MRN: [id number] D: Patient did not go to wrap up group but came into dayroom after. Pt had snack and talked to Probation officer. Pt reports he is doing well. Pt mood and affect appeared depressed and anxious. Pt denies SI/HI/AVH and pain. Cooperative with assessment. No acute distressed noted at this time.   A: Medications administered as prescribed. Support and encouragement provided as needed to attend groups and engage in milieu. Pt encouraged to discuss feelings and come to staff with any question or concerns.   R: Patient remains safe.

## 2018-02-10 NOTE — Progress Notes (Signed)
[name] MD Progress Note  02/10/2018 12:03 PM [name] [name]  MRN:  [id number] Subjective: I feel like I want to not live.  I am happy.  I feel like my thoughts are better.  And I am getting the help I needed, this was the help I been searching for.   Objective: 52 year old male presented to [facility] with worsening depression, and suicidal thoughts of jumping off a bridge.  He reports increase use of alcohol and periods of binge drinking up to 5 40 ounce beers a day.  Patient assessed and case discussed during treatment team.  During initial evaluation patient reports some improvement in his symptoms, however continues to endorse a significant amount of worsening depression.  Throughout the evaluation patient is observed to be smiling inappropriately, and he contributes his euphoric mood to feeling better.  He states his goal today is to work on housing and day mark.  It is also noted that patient continues to endorse back pain that is disrupting his physical ability and sleeping.  He notes that his appetite has remained poor, however he does attend all meals and eats very minimal.  He was recently started on Abilify for mood stabilization, and Zoloft for depression and anxiety.  At this time he denies any withdrawal symptoms, with the exception of nausea.  Patient is reminded that nausea medication is available for him if it continues to worsen.  At this time he denies suicidal ideations, homicidal ideations, auditory or visual hallucinations.  He is able to contract for safety while on the unit.   Principal Problem: <principal problem not specified> Diagnosis:   Patient Active Problem List   Diagnosis Date Noted  . MDD (major depressive disorder), recurrent severe, without psychosis ([place]) [F33.2] 02/08/2018  . Major depressive disorder, recurrent episode, severe with anxious distress ([place]) [F33.2] 05/23/2016  . PTSD (post-traumatic stress disorder) [F43.10] 05/23/2016  . Cocaine use disorder, moderate,  in early remission ([place]) [F14.21] 05/23/2016  . Alcohol use disorder, moderate, dependence ([place]) [F10.20] 05/23/2016  . Affective psychosis, bipolar ([place]) [F31.9] 04/30/2016  . Cocaine abuse with cocaine-induced mood disorder ([place]) [F14.14] 03/04/2016  . Alcohol dependence with uncomplicated withdrawal ([place]) [F10.230]   . Alcohol use disorder, severe, dependence ([place]) [F10.20] 01/18/2015  . Alcohol dependence with alcohol-induced mood disorder ([place]) [F10.24]   . Suicidal ideation [R45.851]   . Acute pancreatitis [K85.90] 03/20/2012  . Polysubstance abuse ([place]) [F19.10] 03/20/2012  . Cannabis abuse [F12.10] 01/12/2012  . Alcohol dependence ([place]) [F10.20] 10/15/2011  . Abnormal ECG [R94.31]   . Chest pain [R07.89]   . CAD in native artery [I25.10]   . GERD (gastroesophageal reflux disease) [K21.9]   . Tobacco abuse [Z72.0]   . History of cocaine abuse [Z87.898]   . History of ETOH abuse [Z87.898]   . Bradycardia [R00.1]   . Syncope [R55]    Total Time spent with patient: 30 minutes  Past Psychiatric History: patient has history of prior psychiatric admissions , for depression.  In the past has been diagnosed with Bipolar Disorder - endorses history of depression, even when sober, at this time does not endorse clear history of mania or hypomania. Reports auditory hallucinations over the last few months, describes them as only occurring during periods of alcohol intoxication He was admitted here at [facility] in 2017 for depression and relapse on alcohol . History of  2 suicide attempts many years ago ( in the 71s) by walking in front of traffic and by overdosing .  Past Medical History:  Past Medical History:  Diagnosis Date  . Abnormal ECG    a. early repolarization  . Arthritis    "my whole left side" (05/03/2014)  . Bipolar disorder ([place])   . Bradycardia    a. asymptomatic  . CAD in native artery    a. Nonobstructive cath 11/2007;  b. Presented with ST elevation - Nonobstructive cath  08/2011  . Chest pain, mid sternal   . Coronary artery disease   . GERD (gastroesophageal reflux disease)   . History of cocaine abuse    a. quit ? 2009  . History of ETOH abuse    a. drinks 2 "40's" / wk  . Marijuana abuse    a. uses ~ 1x /wk or less  . Pneumonia   . Stomach ulcer   . Syncope    a. 12/2010 - presumed to be vasovagal  . Tobacco abuse     Past Surgical History:  Procedure Laterality Date  . CARDIAC CATHETERIZATION  2009; 08/2011   [name] 08/06/2011  . LEFT HEART CATHETERIZATION WITH CORONARY ANGIOGRAM N/A 08/16/2011   Procedure: LEFT HEART CATHETERIZATION WITH CORONARY ANGIOGRAM;  Surgeon: [name], MD;  Location: [facility] CATH LAB;  Service: Cardiovascular;  Laterality: N/A;   Family History: History reviewed. No pertinent family history. Family Psychiatric  History: reports two sisters have history of depression, no suicide attempts in family, reports parents have history of alcohol use disorder, but are now sober. Social History:  Social History   Substance and Sexual Activity  Alcohol Use Yes  . Alcohol/week: 48.0 standard drinks  . Types: 48 Cans of beer per week   Comment: 2 40 oz beers per day     Social History   Substance and Sexual Activity  Drug Use Yes  . Types: Marijuana, Cocaine    Social History   Socioeconomic History  . Marital status: Single    Spouse name: Not on file  . Number of children: Not on file  . Years of education: Not on file  . Highest education level: Not on file  Occupational History  . Not on file  Social Needs  . Financial resource strain: Not on file  . Food insecurity:    Worry: Not on file    Inability: Not on file  . Transportation needs:    Medical: Not on file    Non-medical: Not on file  Tobacco Use  . Smoking status: Current Some Day Smoker    Packs/day: 0.50    Years: 20.00    Pack years: 10.00    Types: Cigarettes    Last attempt to quit: 06/24/2015    Years since quitting: 2.6  . Smokeless  tobacco: Never Used  Substance and Sexual Activity  . Alcohol use: Yes    Alcohol/week: 48.0 standard drinks    Types: 48 Cans of beer per week    Comment: 2 40 oz beers per day  . Drug use: Yes    Types: Marijuana, Cocaine  . Sexual activity: Yes  Lifestyle  . Physical activity:    Days per week: Not on file    Minutes per session: Not on file  . Stress: Not on file  Relationships  . Social connections:    Talks on phone: Not on file    Gets together: Not on file    Attends religious service: Not on file    Active member of club or organization: Not on file    Attends meetings of clubs or organizations:  Not on file    Relationship status: Not on file  Other Topics Concern  . Not on file  Social History Narrative   Lives with his daughter currently and receives SSI.    Additional Social History:          Sleep: Fair  Appetite:  Fair  Current Medications: Current Facility-Administered Medications  Medication Dose Route Frequency Provider Last Rate Last Dose  . alum & mag hydroxide-simeth (MAALOX/MYLANTA) 200-200-20 MG/5ML suspension 30 mL  30 mL Oral Q4H PRN Ethelene Hal, NP      . ARIPiprazole (ABILIFY) tablet 2 mg  2 mg Oral Daily Cobos, Myer Peer, MD   2 mg at 02/10/18 0842  . chlordiazePOXIDE (LIBRIUM) capsule 25 mg  25 mg Oral Q6H PRN Cobos, Myer Peer, MD      . hydrOXYzine (ATARAX/VISTARIL) tablet 25 mg  25 mg Oral TID Ethelene Hal, NP   25 mg at 02/10/18 4098  . hydrOXYzine (ATARAX/VISTARIL) tablet 25 mg  25 mg Oral Q6H PRN Cobos, Myer Peer, MD      . loperamide (IMODIUM) capsule 2-4 mg  2-4 mg Oral PRN Cobos, Myer Peer, MD      . magnesium hydroxide (MILK OF MAGNESIA) suspension 30 mL  30 mL Oral Daily PRN Ethelene Hal, NP      . multivitamin with minerals tablet 1 tablet  1 tablet Oral Daily Cobos, Myer Peer, MD   1 tablet at 02/10/18 [phone]  . ondansetron (ZOFRAN-ODT) disintegrating tablet 4 mg  4 mg Oral Q6H PRN Cobos, Myer Peer,  MD   4 mg at 02/10/18 0844  . sertraline (ZOLOFT) tablet 50 mg  50 mg Oral Daily Cobos, Myer Peer, MD   50 mg at 02/10/18 0842  . thiamine (VITAMIN B-1) tablet 100 mg  100 mg Oral Daily Cobos, Myer Peer, MD   100 mg at 02/10/18 0842  . traZODone (DESYREL) tablet 50 mg  50 mg Oral QHS PRN Ethelene Hal, NP   50 mg at 02/08/18 2253  . traZODone (DESYREL) tablet 50 mg  50 mg Oral QHS Ethelene Hal, NP        Lab Results:  Results for orders placed or performed during the hospital encounter of 02/08/18 (from the past 48 hour(s))  TSH     Status: None   Collection Time: 02/10/18  6:37 AM  Result Value Ref Range   TSH 0.651 0.350 - 4.500 uIU/mL    Comment: Performed by a 3rd Generation assay with a functional sensitivity of <=0.01 uIU/mL. Performed at [facility], [place] [address] [name], [place] [address]     Blood Alcohol level:  Lab Results  Component Value Date   ETH <10 02/08/2018   ETH 168 (H) 56/21/3086    Metabolic Disorder Labs: Lab Results  Component Value Date   HGBA1C 4.9 05/04/2016   MPG 94 05/04/2016   MPG 82 05/03/2014   No results found for: PROLACTIN Lab Results  Component Value Date   CHOL 170 05/04/2016   TRIG 155 (H) 05/04/2016   HDL 65 05/04/2016   CHOLHDL 2.6 05/04/2016   VLDL 31 05/04/2016   LDLCALC 74 05/04/2016   LDLCALC 42 05/03/2014    Physical Findings: AIMS: Facial and Oral Movements Muscles of Facial Expression: None, normal Lips and Perioral Area: None, normal Jaw: None, normal Tongue: None, normal,Extremity Movements Upper (arms, wrists, hands, fingers): None, normal Lower (legs, knees, ankles, toes): None, normal, Trunk Movements Neck, shoulders,  hips: None, normal, Overall Severity Severity of abnormal movements (highest score from questions above): None, normal Incapacitation due to abnormal movements: None, normal Patient's awareness of abnormal movements (rate only patient's report): No  Awareness, Dental Status Current problems with teeth and/or dentures?: No Does patient usually wear dentures?: No  CIWA:  CIWA-Ar Total: 1 COWS:     Musculoskeletal: Strength & Muscle Tone: within normal limits Gait & Station: normal Patient leans: N/A  Psychiatric Specialty Exam: Physical Exam  ROS  Blood pressure 123/80, pulse (!) 58, temperature 98.5 F (36.9 C), temperature source Oral, resp. rate 14, height 5' 4.5" (1.638 m), weight 52.2 kg, SpO2 100 %.Body mass index is 19.43 kg/m.  General Appearance: Fairly Groomed  Eye Contact:  Fair  Speech:  Clear and Coherent and Slow  Volume:  Normal  Mood:  Depressed and Feel better  Affect:  Non-Congruent, Inappropriate and Patient noted to be smiling and laughing inappropriately.  Thought Process:  Linear and Descriptions of Associations: Intact  Orientation:  Full (Time, Place, and Person)  Thought Content:  Logical  Suicidal Thoughts:  No  Homicidal Thoughts:  No  Memory:  Immediate;   Fair Recent;   Fair  Judgement:  Impaired  Insight:  Lacking  Psychomotor Activity:  Normal  Concentration:  Concentration: Fair and Attention Span: Fair  Recall:  [employer] of Knowledge:  Fair  Language:  Fair  Akathisia:  No  Handed:  Right  AIMS (if indicated):     Assets:  Communication Skills Desire for Improvement Leisure Time Physical Health  ADL's:  Intact  Cognition:  WNL  Sleep:  Number of Hours: 5     Treatment Plan Summary: Plan Admit inpatient for crisis stabilization, medication management, and detox.  Will continue low-dose Abilify 2 mg p.o. daily for mood stabilization, will increase dose as appropriate.  We will also continue Zoloft 50 mg p.o. daily for depression.  Patient is encouraged to attend all groups, and began to assist in treatment team in place of housing.  Nanci Pina, FNP 02/10/2018, 12:03 PM

## 2018-02-10 NOTE — Progress Notes (Signed)
D: Pt was in bed in his room upon initial approach.  Pt presents with depressed affect and mood.  He reports he "had a rough day but I'm trying to level out a little."  He reports he felt nauseous earlier and had decreased appetite.  Goal is to "try to get some rest."  Pt denies SI/HI, denies hallucinations, denies pain.  Pt has been isolative to his room for the majority of the evening.   A: Introduced self to pt.  Actively listened to pt and offered support and encouragement. Medication offered per order.  Q15 minute safety checks maintained.  R: Pt is safe on the unit.  Pt refused scheduled Trazodone, reporting he would request it if he feels like he needs the medication.  Pt verbally contracts for safety.  Will continue to monitor and assess.

## 2018-02-10 NOTE — Progress Notes (Signed)
Patient ID: [name] [name], male   DOB: 1966-05-21, 52 y.o.   MRN: [id number]  Nursing Progress Note [id number]  Data: Patient presents with sad/sullen affect and depressed mood. Patient complaint with scheduled medications but is not attending groups and is isolative to his room on the unit. Patient provided but declined to complete their self-inventory sheet. Patient complains of chronic back pain. Patient currently denies SI/HI/AVH.   Action: Patient educated about and provided medication per provider's orders. Patient safety maintained with q15 min safety checks and frequent rounding. High fall risk precautions in place. Emotional support given. 1:1 interaction and active listening provided. Patient encouraged to attend meals and groups. Patient encouraged to work on treatment plan and goals. Labs, vital signs and patient behavior monitored throughout shift.   Response: Patient remains safe on the unit at this time. Will continue to support and monitor.

## 2018-02-11 MED ORDER — LIDOCAINE 5 % EX PTCH
1.0000 | MEDICATED_PATCH | CUTANEOUS | Status: DC
  Administered 2018-02-11: 1 via TRANSDERMAL
  Filled 2018-02-11 (×6): qty 1

## 2018-02-11 NOTE — Progress Notes (Signed)
[name] Progress Note  02/11/2018 1:21 PM [name] [name]  MRN:  [id number] Subjective: I feel good today. I need help with the homewoek from yesterday. I cant read and stuff like that. I cant look in the phone book and find places to call.    Objective: 52 year old male presented to [facility] with worsening depression, and suicidal thoughts of jumping off a bridge.  He reports increase use of alcohol and periods of binge drinking up to 5 40 ounce beers a day.  Patient assessed and case discussed during treatment team.  During evaluation patient continues to report improvement in symptoms, yet remains with increasingly jokes and childlike behaviors. As previously reported he smiles and giggles throughout the evaluation. However he denies any preoccupation and does not appear to be responding to internal stimuli. He does not attend groups, however is present in the dayroom during snack time. He no longer end ores depressed mood, however his affect is congruent as he is observe rd smiling. His goal today is to continue working on housing and discharge planning.He was recently started on Abilify for mood stabilization, and Zoloft for depression and anxiety.  At this time he denies any withdrawal symptoms, with the exception of nausea.  Patient is reminded that nausea medication is available for him if it continues to worsen.  At this time he denies suicidal ideations, homicidal ideations, auditory or visual hallucinations.  He is able to contract for safety while on the unit.   Principal Problem: <principal problem not specified> Diagnosis:   Patient Active Problem List   Diagnosis Date Noted  . MDD (major depressive disorder), recurrent severe, without psychosis ([place]) [F33.2] 02/08/2018  . Major depressive disorder, recurrent episode, severe with anxious distress ([place]) [F33.2] 05/23/2016  . PTSD (post-traumatic stress disorder) [F43.10] 05/23/2016  . Cocaine use disorder, moderate, in early remission ([place])  [F14.21] 05/23/2016  . Alcohol use disorder, moderate, dependence ([place]) [F10.20] 05/23/2016  . Affective psychosis, bipolar ([place]) [F31.9] 04/30/2016  . Cocaine abuse with cocaine-induced mood disorder ([place]) [F14.14] 03/04/2016  . Alcohol dependence with uncomplicated withdrawal ([place]) [F10.230]   . Alcohol use disorder, severe, dependence ([place]) [F10.20] 01/18/2015  . Alcohol dependence with alcohol-induced mood disorder ([place]) [F10.24]   . Suicidal ideation [R45.851]   . Acute pancreatitis [K85.90] 03/20/2012  . Polysubstance abuse ([place]) [F19.10] 03/20/2012  . Cannabis abuse [F12.10] 01/12/2012  . Alcohol dependence ([place]) [F10.20] 10/15/2011  . Abnormal ECG [R94.31]   . Chest pain [R07.89]   . CAD in native artery [I25.10]   . GERD (gastroesophageal reflux disease) [K21.9]   . Tobacco abuse [Z72.0]   . History of cocaine abuse [Z87.898]   . History of ETOH abuse [Z87.898]   . Bradycardia [R00.1]   . Syncope [R55]    Total Time spent with patient: 30 minutes  Past Psychiatric History: patient has history of prior psychiatric admissions , for depression.  In the past has been diagnosed with Bipolar Disorder - endorses history of depression, even when sober, at this time does not endorse clear history of mania or hypomania. Reports auditory hallucinations over the last few months, describes them as only occurring during periods of alcohol intoxication He was admitted here at [facility] in 2017 for depression and relapse on alcohol . History of  2 suicide attempts many years ago ( in the 108s) by walking in front of traffic and by overdosing .  Past Medical History:  Past Medical History:  Diagnosis Date  . Abnormal ECG  a. early repolarization  . Arthritis    "my whole left side" (05/03/2014)  . Bipolar disorder ([place])   . Bradycardia    a. asymptomatic  . CAD in native artery    a. Nonobstructive cath 11/2007;  b. Presented with ST elevation - Nonobstructive cath 08/2011  . Chest pain,  mid sternal   . Coronary artery disease   . GERD (gastroesophageal reflux disease)   . History of cocaine abuse    a. quit ? 2009  . History of ETOH abuse    a. drinks 2 "40's" / wk  . Marijuana abuse    a. uses ~ 1x /wk or less  . Pneumonia   . Stomach ulcer   . Syncope    a. 12/2010 - presumed to be vasovagal  . Tobacco abuse     Past Surgical History:  Procedure Laterality Date  . CARDIAC CATHETERIZATION  2009; 08/2011   [name] 08/06/2011  . LEFT HEART CATHETERIZATION WITH CORONARY ANGIOGRAM N/A 08/16/2011   Procedure: LEFT HEART CATHETERIZATION WITH CORONARY ANGIOGRAM;  Surgeon: [name], MD;  Location: [facility] CATH LAB;  Service: Cardiovascular;  Laterality: N/A;   Family History: History reviewed. No pertinent family history. Family Psychiatric  History: reports two sisters have history of depression, no suicide attempts in family, reports parents have history of alcohol use disorder, but are now sober. Social History:  Social History   Substance and Sexual Activity  Alcohol Use Yes  . Alcohol/week: 48.0 standard drinks  . Types: 48 Cans of beer per week   Comment: 2 40 oz beers per day     Social History   Substance and Sexual Activity  Drug Use Yes  . Types: Marijuana, Cocaine    Social History   Socioeconomic History  . Marital status: Single    Spouse name: Not on file  . Number of children: Not on file  . Years of education: Not on file  . Highest education level: Not on file  Occupational History  . Not on file  Social Needs  . Financial resource strain: Not on file  . Food insecurity:    Worry: Not on file    Inability: Not on file  . Transportation needs:    Medical: Not on file    Non-medical: Not on file  Tobacco Use  . Smoking status: Current Some Day Smoker    Packs/day: 0.50    Years: 20.00    Pack years: 10.00    Types: Cigarettes    Last attempt to quit: 06/24/2015    Years since quitting: 2.6  . Smokeless tobacco: Never Used   Substance and Sexual Activity  . Alcohol use: Yes    Alcohol/week: 48.0 standard drinks    Types: 48 Cans of beer per week    Comment: 2 40 oz beers per day  . Drug use: Yes    Types: Marijuana, Cocaine  . Sexual activity: Yes  Lifestyle  . Physical activity:    Days per week: Not on file    Minutes per session: Not on file  . Stress: Not on file  Relationships  . Social connections:    Talks on phone: Not on file    Gets together: Not on file    Attends religious service: Not on file    Active member of club or organization: Not on file    Attends meetings of clubs or organizations: Not on file    Relationship status: Not on file  Other  Topics Concern  . Not on file  Social History Narrative   Lives with his daughter currently and receives SSI.    Additional Social History:          Sleep: Fair  Appetite:  Fair  Current Medications: Current Facility-Administered Medications  Medication Dose Route Frequency Provider Last Rate Last Dose  . alum & mag hydroxide-simeth (MAALOX/MYLANTA) 200-200-20 MG/5ML suspension 30 mL  30 mL Oral Q4H PRN Ethelene Hal, NP      . ARIPiprazole (ABILIFY) tablet 2 mg  2 mg Oral Daily Cobos, Myer Peer, MD   2 mg at 02/11/18 0814  . chlordiazePOXIDE (LIBRIUM) capsule 25 mg  25 mg Oral Q6H PRN Cobos, Myer Peer, MD      . hydrOXYzine (ATARAX/VISTARIL) tablet 25 mg  25 mg Oral Q6H PRN Cobos, Fernando A, MD      . lidocaine (LIDODERM) 5 % 1 patch  1 patch Transdermal Q24H Nanci Pina, FNP   1 patch at 02/11/18 1034  . loperamide (IMODIUM) capsule 2-4 mg  2-4 mg Oral PRN Cobos, Myer Peer, MD      . magnesium hydroxide (MILK OF MAGNESIA) suspension 30 mL  30 mL Oral Daily PRN Ethelene Hal, NP      . multivitamin with minerals tablet 1 tablet  1 tablet Oral Daily Cobos, Myer Peer, MD   1 tablet at 02/11/18 0815  . ondansetron (ZOFRAN-ODT) disintegrating tablet 4 mg  4 mg Oral Q6H PRN Cobos, Myer Peer, MD   4 mg at 02/10/18  1555  . sertraline (ZOLOFT) tablet 50 mg  50 mg Oral Daily Cobos, Myer Peer, MD   50 mg at 02/11/18 0815  . thiamine (VITAMIN B-1) tablet 100 mg  100 mg Oral Daily Cobos, Myer Peer, MD   100 mg at 02/11/18 0814  . traZODone (DESYREL) tablet 50 mg  50 mg Oral QHS Ethelene Hal, NP        Lab Results:  Results for orders placed or performed during the hospital encounter of 02/08/18 (from the past 48 hour(s))  TSH     Status: None   Collection Time: 02/10/18  6:37 AM  Result Value Ref Range   TSH 0.651 0.350 - 4.500 uIU/mL    Comment: Performed by a 3rd Generation assay with a functional sensitivity of <=0.01 uIU/mL. Performed at [facility], [place] [address], [place],  [address]   Glucose, capillary     Status: None   Collection Time: 02/10/18  3:40 PM  Result Value Ref Range   Glucose-Capillary 87 70 - 99 mg/dL    Blood Alcohol level:  Lab Results  Component Value Date   ETH <10 02/08/2018   ETH 168 (H) 48/54/6270    Metabolic Disorder Labs: Lab Results  Component Value Date   HGBA1C 4.9 05/04/2016   MPG 94 05/04/2016   MPG 82 05/03/2014   No results found for: PROLACTIN Lab Results  Component Value Date   CHOL 170 05/04/2016   TRIG 155 (H) 05/04/2016   HDL 65 05/04/2016   CHOLHDL 2.6 05/04/2016   VLDL 31 05/04/2016   LDLCALC 74 05/04/2016   LDLCALC 42 05/03/2014    Physical Findings: AIMS: Facial and Oral Movements Muscles of Facial Expression: None, normal Lips and Perioral Area: None, normal Jaw: None, normal Tongue: None, normal,Extremity Movements Upper (arms, wrists, hands, fingers): None, normal Lower (legs, knees, ankles, toes): None, normal, Trunk Movements Neck, shoulders, hips: None, normal, Overall Severity Severity  of abnormal movements (highest score from questions above): None, normal Incapacitation due to abnormal movements: None, normal Patient's awareness of abnormal movements (rate only patient's report):  No Awareness, Dental Status Current problems with teeth and/or dentures?: No Does patient usually wear dentures?: No  CIWA:  CIWA-Ar Total: 0 COWS:     Musculoskeletal: Strength & Muscle Tone: within normal limits Gait & Station: normal Patient leans: N/A  Psychiatric Specialty Exam: Physical Exam   ROS   Blood pressure 122/81, pulse 61, temperature 98.4 F (36.9 C), temperature source Oral, resp. rate 16, height 5' 4.5" (1.638 m), weight 52.2 kg, SpO2 100 %.Body mass index is 19.43 kg/m.  General Appearance: Fairly Groomed  Eye Contact:  Fair  Speech:  Clear and Coherent and Slow  Volume:  Normal  Mood:  Depressed and Feel better  Affect:  Non-Congruent, Inappropriate and Patient noted to be smiling and laughing inappropriately.  Thought Process:  Linear and Descriptions of Associations: Intact  Orientation:  Full (Time, Place, and Person)  Thought Content:  Logical  Suicidal Thoughts:  No  Homicidal Thoughts:  No  Memory:  Immediate;   Fair Recent;   Fair  Judgement:  Impaired  Insight:  Lacking  Psychomotor Activity:  Normal  Concentration:  Concentration: Fair and Attention Span: Fair  Recall:  [employer] of Knowledge:  Fair  Language:  Fair  Akathisia:  No  Handed:  Right  AIMS (if indicated):     Assets:  Communication Skills Desire for Improvement Leisure Time Physical Health  ADL's:  Intact  Cognition:  WNL  Sleep:  Number of Hours: 6.25     Treatment Plan Summary: Plan Admit inpatient for crisis stabilization, medication management, and detox.  Will continue low-dose Abilify 2 mg p.o. daily for mood stabilization, will increase dose as appropriate.  We will also continue Zoloft 50 mg p.o. daily for depression.  Patient is encouraged to attend all groups, and began to assist in treatment team in place of housing.  Nanci Pina, FNP 02/11/2018, 1:21 PM

## 2018-02-11 NOTE — [facility] Group Notes (Signed)
LCSW Group Therapy Note  02/11/2018    10:00-11:00am   Type of Therapy and Topic:  Group Therapy: Anger and Coping Skills  Participation Level:  Did Not Attend   Description of Group:   In this group, patients learned how to recognize the physical, cognitive, emotional, and behavioral responses they have to anger-provoking situations.  They identified how they usually or often react when angered, and learned how healthy and unhealthy coping skills work initially, but the unhealthy ones stop working.   They analyzed how their frequently-chosen coping skill is possibly beneficial and how it is possibly unhelpful.  The group discussed a variety of healthier coping skills that could help in resolving the actual issues, as well as how to go about planning for the the possibility of future similar situations.  Therapeutic Goals: 1. Patients will identify one thing that makes them angry and how they feel emotionally and physically, what their thoughts are or tend to be in those situations, and what healthy or unhealthy coping mechanism they typically use 2. Patients will identify how their coping technique works for them, as well as how it works against them. 3. Patients will explore possible new behaviors to use in future anger situations. 4. Patients will learn that anger itself is normal and cannot be eliminated, and that healthier coping skills can assist with resolving conflict rather than worsening situations.  Summary of Patient Progress:  N/A  Therapeutic Modalities:   Cognitive Behavioral Therapy Motivation Interviewing  [name] [name]  .

## 2018-02-11 NOTE — Progress Notes (Signed)
D. Pt pleasant on approach, no complaints voiced at this time.  Pt was positive for evening AA group, observed engaged in appropriate interaction with peers on the unit.  Pt denies SI/HI/AVH at this time.  A.  Support and encouragement offered, medication given as ordered  R.  Pt remains safe on the unit, will continue to monitor.   

## 2018-02-11 NOTE — Plan of Care (Signed)
Progress Note  D: pt found in bed; pt compliant with medication administration. Pt states he slept well last night. Pt denies any si/hi/ah/vh and verbally agrees to approach staff if these become apparent. Pt rates his depression/hopelessness/anxiety a 0/0/10 out of 10 respectively. Pt rates his physical pain an 8/10 in his lower back which is chronic. Pt did not have a goal for today.  A: pt provided support and encouragement. Pt given meds per protocol and standing orders. Pt received a lidocaine patch for his back. Q55m safety checks implemented and continued. R: pt safe on the unit. Will continue to monitor.   Pt progressing in the following metrics  Problem: Education: Goal: Knowledge of [place] [facility] Education information/materials will improve Outcome: Progressing Goal: Emotional status will improve Outcome: Progressing Goal: Mental status will improve Outcome: Progressing Goal: Verbalization of understanding the information provided will improve Outcome: Progressing   Problem: Activity: Goal: Interest or engagement in activities will improve Outcome: Progressing Goal: Sleeping patterns will improve Outcome: Progressing

## 2018-02-11 NOTE — [facility] Group Notes (Signed)
[facility] Group Notes:  (Nursing/MHT/Case Management/Adjunct)  Date:  02/11/2018  Time:  1:15 PM  Type of Therapy:  Nurse Education  Participation Level:  Active  Participation Quality:  Appropriate  Affect:  Appropriate  Cognitive:  Appropriate  Insight:  Appropriate  Engagement in Group:  Engaged  Modes of Intervention:  Discussion and Education  Summary of Progress/Problems: Nurse led group: Life Skills/Identifying Needs  Baron Sane 02/11/2018, 3:40 PM

## 2018-02-12 MED ORDER — TRAZODONE HCL 50 MG PO TABS
50.0000 mg | ORAL_TABLET | Freq: Every evening | ORAL | Status: DC | PRN
  Administered 2018-02-12 – 2018-02-13 (×4): 50 mg via ORAL
  Filled 2018-02-12: qty 1
  Filled 2018-02-12: qty 28
  Filled 2018-02-12: qty 1

## 2018-02-12 MED ORDER — ESCITALOPRAM OXALATE 5 MG PO TABS
5.0000 mg | ORAL_TABLET | Freq: Every day | ORAL | Status: DC
  Administered 2018-02-12 – 2018-02-14 (×3): 5 mg via ORAL
  Filled 2018-02-12 (×2): qty 1
  Filled 2018-02-12: qty 14
  Filled 2018-02-12 (×3): qty 1

## 2018-02-12 MED ORDER — ARIPIPRAZOLE 5 MG PO TABS
5.0000 mg | ORAL_TABLET | Freq: Every day | ORAL | Status: DC
  Administered 2018-02-13 – 2018-02-14 (×2): 5 mg via ORAL
  Filled 2018-02-12: qty 1
  Filled 2018-02-12: qty 14
  Filled 2018-02-12 (×2): qty 1

## 2018-02-12 NOTE — Progress Notes (Addendum)
[facility] Progress Note  02/12/2018 3:13 PM [name] [name]  MRN:  [id number] Subjective: I feel good today. I need help with the homewoek from yesterday. I cant read and stuff like that. I cant look in the phone book and find places to call.    Objective: 52 year old male presented to [facility] with worsening depression, and suicidal thoughts of jumping off a bridge.  He reports increase use of alcohol and periods of binge drinking up to 5 40 ounce beers a day.  Patient assessed and case discussed during treatment team.  During evaluation patient presents with improved mood and affect. Today he is able to acknowledge and take initiative into his treatment plans. He is making phone calls to search for treatment places and [place] houses. he is advised that his detox is almost complete.  He does not attend groups, however is present in the dayroom during snack time.He continues to endorse some nausea associated to his medications, discussed with patient about discontinuing Zoloft and starting Lexapro. He was not compliant with his medication this morning due to nausea.  At this time he denies any withdrawal symptoms, with the exception of nausea.  Patient is reminded that nausea medication is available for him if it continues to worsen.  At this time he denies suicidal ideations, homicidal ideations, auditory or visual hallucinations.  He is able to contract for safety while on the unit.   Principal Problem: <principal problem not specified> Diagnosis:   Patient Active Problem List   Diagnosis Date Noted  . MDD (major depressive disorder), recurrent severe, without psychosis ([place]) [F33.2] 02/08/2018  . Major depressive disorder, recurrent episode, severe with anxious distress ([place]) [F33.2] 05/23/2016  . PTSD (post-traumatic stress disorder) [F43.10] 05/23/2016  . Cocaine use disorder, moderate, in early remission ([place]) [F14.21] 05/23/2016  . Alcohol use disorder, moderate, dependence ([place]) [F10.20]  05/23/2016  . Affective psychosis, bipolar ([place]) [F31.9] 04/30/2016  . Cocaine abuse with cocaine-induced mood disorder ([place]) [F14.14] 03/04/2016  . Alcohol dependence with uncomplicated withdrawal ([place]) [F10.230]   . Alcohol use disorder, severe, dependence ([place]) [F10.20] 01/18/2015  . Alcohol dependence with alcohol-induced mood disorder ([place]) [F10.24]   . Suicidal ideation [R45.851]   . Acute pancreatitis [K85.90] 03/20/2012  . Polysubstance abuse ([place]) [F19.10] 03/20/2012  . Cannabis abuse [F12.10] 01/12/2012  . Alcohol dependence ([place]) [F10.20] 10/15/2011  . Abnormal ECG [R94.31]   . Chest pain [R07.89]   . CAD in native artery [I25.10]   . GERD (gastroesophageal reflux disease) [K21.9]   . Tobacco abuse [Z72.0]   . History of cocaine abuse [Z87.898]   . History of ETOH abuse [Z87.898]   . Bradycardia [R00.1]   . Syncope [R55]    Total Time spent with patient: 30 minutes  Past Psychiatric History: patient has history of prior psychiatric admissions , for depression.  In the past has been diagnosed with Bipolar Disorder - endorses history of depression, even when sober, at this time does not endorse clear history of mania or hypomania. Reports auditory hallucinations over the last few months, describes them as only occurring during periods of alcohol intoxication He was admitted here at [facility] in 2017 for depression and relapse on alcohol . History of  2 suicide attempts many years ago ( in the 56s) by walking in front of traffic and by overdosing .  Past Medical History:  Past Medical History:  Diagnosis Date  . Abnormal ECG    a. early repolarization  . Arthritis    "my whole  left side" (05/03/2014)  . Bipolar disorder ([place])   . Bradycardia    a. asymptomatic  . CAD in native artery    a. Nonobstructive cath 11/2007;  b. Presented with ST elevation - Nonobstructive cath 08/2011  . Chest pain, mid sternal   . Coronary artery disease   . GERD (gastroesophageal reflux  disease)   . History of cocaine abuse    a. quit ? 2009  . History of ETOH abuse    a. drinks 2 "40's" / wk  . Marijuana abuse    a. uses ~ 1x /wk or less  . Pneumonia   . Stomach ulcer   . Syncope    a. 12/2010 - presumed to be vasovagal  . Tobacco abuse     Past Surgical History:  Procedure Laterality Date  . CARDIAC CATHETERIZATION  2009; 08/2011   [name] 08/06/2011  . LEFT HEART CATHETERIZATION WITH CORONARY ANGIOGRAM N/A 08/16/2011   Procedure: LEFT HEART CATHETERIZATION WITH CORONARY ANGIOGRAM;  Surgeon: [name], MD;  Location: [facility] CATH LAB;  Service: Cardiovascular;  Laterality: N/A;   Family History: History reviewed. No pertinent family history. Family Psychiatric  History: reports two sisters have history of depression, no suicide attempts in family, reports parents have history of alcohol use disorder, but are now sober. Social History:  Social History   Substance and Sexual Activity  Alcohol Use Yes  . Alcohol/week: 48.0 standard drinks  . Types: 48 Cans of beer per week   Comment: 2 40 oz beers per day     Social History   Substance and Sexual Activity  Drug Use Yes  . Types: Marijuana, Cocaine    Social History   Socioeconomic History  . Marital status: Single    Spouse name: Not on file  . Number of children: Not on file  . Years of education: Not on file  . Highest education level: Not on file  Occupational History  . Not on file  Social Needs  . Financial resource strain: Not on file  . Food insecurity:    Worry: Not on file    Inability: Not on file  . Transportation needs:    Medical: Not on file    Non-medical: Not on file  Tobacco Use  . Smoking status: Current Some Day Smoker    Packs/day: 0.50    Years: 20.00    Pack years: 10.00    Types: Cigarettes    Last attempt to quit: 06/24/2015    Years since quitting: 2.6  . Smokeless tobacco: Never Used  Substance and Sexual Activity  . Alcohol use: Yes    Alcohol/week: 48.0  standard drinks    Types: 48 Cans of beer per week    Comment: 2 40 oz beers per day  . Drug use: Yes    Types: Marijuana, Cocaine  . Sexual activity: Yes  Lifestyle  . Physical activity:    Days per week: Not on file    Minutes per session: Not on file  . Stress: Not on file  Relationships  . Social connections:    Talks on phone: Not on file    Gets together: Not on file    Attends religious service: Not on file    Active member of club or organization: Not on file    Attends meetings of clubs or organizations: Not on file    Relationship status: Not on file  Other Topics Concern  . Not on file  Social History Narrative  Lives with his daughter currently and receives SSI.    Additional Social History:          Sleep: Fair  Appetite:  Fair  Current Medications: Current Facility-Administered Medications  Medication Dose Route Frequency Provider Last Rate Last Dose  . alum & mag hydroxide-simeth (MAALOX/MYLANTA) 200-200-20 MG/5ML suspension 30 mL  30 mL Oral Q4H PRN Ethelene Hal, NP      . Derrill Memo ON 02/13/2018] ARIPiprazole (ABILIFY) tablet 5 mg  5 mg Oral Daily Starkes, Takia S, FNP      . escitalopram (LEXAPRO) tablet 5 mg  5 mg Oral Daily Starkes, Takia S, FNP      . lidocaine (LIDODERM) 5 % 1 patch  1 patch Transdermal Q24H Nanci Pina, FNP   Stopped at 02/12/18 1309  . magnesium hydroxide (MILK OF MAGNESIA) suspension 30 mL  30 mL Oral Daily PRN Ethelene Hal, NP      . multivitamin with minerals tablet 1 tablet  1 tablet Oral Daily Cobos, Myer Peer, MD   Stopped at 02/12/18 0800  . thiamine (VITAMIN B-1) tablet 100 mg  100 mg Oral Daily Cobos, Myer Peer, MD   Stopped at 02/12/18 0800  . traZODone (DESYREL) tablet 50 mg  50 mg Oral QHS Ethelene Hal, NP   50 mg at 02/11/18 2128    Lab Results:  Results for orders placed or performed during the hospital encounter of 02/08/18 (from the past 48 hour(s))  Glucose, capillary     Status:  None   Collection Time: 02/10/18  3:40 PM  Result Value Ref Range   Glucose-Capillary 87 70 - 99 mg/dL    Blood Alcohol level:  Lab Results  Component Value Date   ETH <10 02/08/2018   ETH 168 (H) 86/76/7209    Metabolic Disorder Labs: Lab Results  Component Value Date   HGBA1C 4.9 05/04/2016   MPG 94 05/04/2016   MPG 82 05/03/2014   No results found for: PROLACTIN Lab Results  Component Value Date   CHOL 170 05/04/2016   TRIG 155 (H) 05/04/2016   HDL 65 05/04/2016   CHOLHDL 2.6 05/04/2016   VLDL 31 05/04/2016   LDLCALC 74 05/04/2016   LDLCALC 42 05/03/2014    Physical Findings: AIMS: Facial and Oral Movements Muscles of Facial Expression: None, normal Lips and Perioral Area: None, normal Jaw: None, normal Tongue: None, normal,Extremity Movements Upper (arms, wrists, hands, fingers): None, normal Lower (legs, knees, ankles, toes): None, normal, Trunk Movements Neck, shoulders, hips: None, normal, Overall Severity Severity of abnormal movements (highest score from questions above): None, normal Incapacitation due to abnormal movements: None, normal Patient's awareness of abnormal movements (rate only patient's report): No Awareness, Dental Status Current problems with teeth and/or dentures?: No Does patient usually wear dentures?: No  CIWA:  CIWA-Ar Total: 2 COWS:     Musculoskeletal: Strength & Muscle Tone: within normal limits Gait & Station: normal Patient leans: N/A  Psychiatric Specialty Exam: Physical Exam   ROS   Blood pressure 117/78, pulse (!) 53, temperature 98.4 F (36.9 C), temperature source Oral, resp. rate 20, height 5' 4.5" (1.638 m), weight 52.2 kg, SpO2 100 %.Body mass index is 19.43 kg/m.  General Appearance: Fairly Groomed  Eye Contact:  Fair  Speech:  Clear and Coherent and Slow  Volume:  Normal  Mood:  better, smiling and brightens upon approach  Affect:  Appropriate, Congruent and smiling  Thought Process:  Coherent, Linear and  Descriptions of Associations: Intact  Orientation:  Full (Time, Place, and Person)  Thought Content:  Logical  Suicidal Thoughts:  No  Homicidal Thoughts:  No  Memory:  Immediate;   Fair Recent;   Fair  Judgement:  Impaired  Insight:  Lacking  Psychomotor Activity:  Normal  Concentration:  Concentration: Fair and Attention Span: Fair  Recall:  [employer] of Knowledge:  Fair  Language:  Fair  Akathisia:  No  Handed:  Right  AIMS (if indicated):     Assets:  Communication Skills Desire for Improvement Leisure Time Physical Health  ADL's:  Intact  Cognition:  WNL  Sleep:  Number of Hours: 6.25     Treatment Plan Summary: Plan Admit inpatient for crisis stabilization, medication management, and detox.  Will increase Abilify 5 mg p.o. daily for mood stabilization, will increase dose as appropriate.  We will also discontinue Zoloft 50 mg p.o. daily for depression and start Lexapro 5mg  po daily.  Patient is encouraged to attend all groups, and began to assist in treatment team in place of housing.  Nanci Pina, FNP 02/12/2018, 3:13 PM   ..Agree with NP Progress Note

## 2018-02-12 NOTE — [facility] Group Notes (Signed)
[place] LCSW Group Therapy Note  02/12/2018  10:00-11:00AM  Type of Therapy and Topic:  Group Therapy:  Being Your Own Support  Participation Level:  Did Not Attend   Description of Group:  Patients in this group were introduced to the concept that self-support is an essential part of recovery.  A song entitled "My Own Hero" was played and a group discussion ensued in which patients stated they could relate to the song and it inspired them to realize they have be willing to help themselves in order to succeed, because other people cannot achieve sobriety or stability for them.  We discussed adding a variety of healthy supports to address the various needs in their lives.  A song was played called "I Know Where I've Been" toward the end of group and used to conduct an inspirational wrap-up to group of remembering how far they have already come in their journey.  Therapeutic Goals: 1)  demonstrate the importance of being a part of one's own support system 2)  discuss reasons people in one's life may eventually be unable to be continually supportive  3)  identify the patient's current support system and   4)  elicit commitments to add healthy supports and to become more conscious of being self-supportive   Summary of Patient Progress:  N/A   Therapeutic Modalities:   Motivational Interviewing Activity  Maretta Los

## 2018-02-12 NOTE — Plan of Care (Addendum)
Progress Note  D: pt found in bed this morning; noncompliant with morning medications. Pt states he has felt nauseous after taking these meds and may take them later. Pt states he slept fair. Pt rates his depression/hopelessness/anxiety a 2/3/3 out of 10 respectively. Pt states he is having back pain but wanted to rest instead of getting medications. Pt states his goal for today is to call [facility] and will achieve this by making the call. Pt denies any si/hi/ah/vh and verbally agrees to approach staff if these become apparent.  A: pt provided support and encouragement. Pt given medications per protocol and standing orders. q68m safety checks implemented and continued. R: pt safe on the unit. Will continue to monitor.   Pt progressing in the following metrics  Problem: Coping: Goal: Ability to verbalize frustrations and anger appropriately will improve Outcome: Progressing Goal: Ability to demonstrate self-control will improve Outcome: Progressing   Problem: Health Behavior/Discharge Planning: Goal: Identification of resources available to assist in meeting health care needs will improve Outcome: Progressing Goal: Compliance with treatment plan for underlying cause of condition will improve Outcome: Progressing   Problem: Physical Regulation: Goal: Ability to maintain clinical measurements within normal limits will improve Outcome: Progressing   Problem: Education: Goal: Knowledge of disease or condition will improve Outcome: Progressing Goal: Understanding of discharge needs will improve Outcome: Progressing

## 2018-02-12 NOTE — Progress Notes (Signed)
D.  Pt pleasant on approach, denies complaints at this time.  Pt requested repeat of Trazodone for tonight stating he did not sleep well last night.  Pt was positive for evening AA group, observed engaged in appropriate interaction with peers on the unit.  Pt denies SI/HI/AVH at this time.  A.  Support and encouragement offered, spoke with PA and received order for Trazodone repeat.  R.  Pt remains safe on the unit, will continue to monitor.

## 2018-02-13 NOTE — Progress Notes (Signed)
Recreation Therapy Notes  Date: 9.9.19 Time: 0930 Location: [address] Dayroom  Group Topic: Stress Management  Goal Area(s) Addresses:  Patient will verbalize importance of using healthy stress management.  Patient will identify positive emotions associated with healthy stress management.   Intervention: Stress Management  Activity :  [employer].  LRT introduced the stress management technique of meditation.  Patients were to listen as meditation played to engage in the activity.  Education:  Stress Management, Discharge Planning.   Education Outcome: Acknowledges edcuation/In group clarification offered/Needs additional education  Clinical Observations/Feedback: Pt did not attend group.    Victorino Sparrow, LRT/CTRS         Victorino Sparrow A 02/13/2018 12:05 PM

## 2018-02-13 NOTE — Progress Notes (Signed)
[place] Group Notes:  (Nursing/MHT/Case Management/Adjunct)  Date:  02/13/2018  Time:  2045  Type of Therapy:  wrap up group  Participation Level:  Active  Participation Quality:  Appropriate  Affect:  Appropriate  Cognitive:  Appropriate  Insight:  Improving  Engagement in Group:  Engaged  Modes of Intervention:  Clarification, Education and Support  Summary of Progress/Problems: Pt shared that his medication is working, pt slept well and his nausea is gone. If patient could change one thing it would be his thinking. Pt shared that he would do whatever he would want no matter if it was right or wrong. Pt is grateful for his health.   Shellia Cleverly 02/13/2018, 10:16 PM

## 2018-02-13 NOTE — Progress Notes (Signed)
Patient ID: [name] [name], male   DOB: [birth year], 52 y.o.   MRN: [id number] D) Pt has been appropriate and cooperative on approach. Positive for all unit activities with minimal prompting. Pt states that he "feels better" today. Pt has no c/o pain, denies depression or anxiety. Pt is focused on going to groups and speaking to Education officer, museum about "placement" after d/c. A) Level 3 obs for safety, support and encouragement provided. Med ed reinforced. R) Cooperative. Appropriate.

## 2018-02-13 NOTE — Progress Notes (Signed)
[facility] MD Progress Note  02/13/2018 5:16 PM [name] [name]  MRN:  [id number] Subjective: Patient states he is feeling better than prior to admission.  He states "I am doing all right".  At this time denies suicidal ideations.  He is future oriented and focusing on disposition planning.  He is interested in going to a rehab setting at discharge.  At present denies medication side effects.   Objective: I have discussed case with treatment team and have met with patient . 52 year old male, presented to ED due to worsening depression, suicidal ideations of jumping off a bridge. Reports he has been depressed for several months after death of GF. Reports daily , heavy drinking and daily cannabis use . Currently he reports feeling better, less depressed, presents with a fuller/brighter affect.  Denies suicidal ideations.  He is future oriented and wanting to go to a residential/rehab setting at discharge. No disruptive or agitated behaviors on unit, pleasant on approach, limited group participation. Denies medication side effects-he is currently on Lexapro and Abilify. He is not presenting with symptoms of alcohol withdrawal-no tremors, no diaphoresis, no psychomotor restlessness, vitals stable. Denies suicidal ideations.   Principal Problem: Alcohol use disorder, alcohol induced mood disorder versus MDD  Diagnosis:   Patient Active Problem List   Diagnosis Date Noted  . MDD (major depressive disorder), recurrent severe, without psychosis ([place]) [F33.2] 02/08/2018  . Major depressive disorder, recurrent episode, severe with anxious distress ([place]) [F33.2] 05/23/2016  . PTSD (post-traumatic stress disorder) [F43.10] 05/23/2016  . Cocaine use disorder, moderate, in early remission ([place]) [F14.21] 05/23/2016  . Alcohol use disorder, moderate, dependence ([place]) [F10.20] 05/23/2016  . Affective psychosis, bipolar ([place]) [F31.9] 04/30/2016  . Cocaine abuse with cocaine-induced mood disorder ([place]) [F14.14]  03/04/2016  . Alcohol dependence with uncomplicated withdrawal ([place]) [F10.230]   . Alcohol use disorder, severe, dependence ([place]) [F10.20] 01/18/2015  . Alcohol dependence with alcohol-induced mood disorder ([place]) [F10.24]   . Suicidal ideation [R45.851]   . Acute pancreatitis [K85.90] 03/20/2012  . Polysubstance abuse ([place]) [F19.10] 03/20/2012  . Cannabis abuse [F12.10] 01/12/2012  . Alcohol dependence ([place]) [F10.20] 10/15/2011  . Abnormal ECG [R94.31]   . Chest pain [R07.89]   . CAD in native artery [I25.10]   . GERD (gastroesophageal reflux disease) [K21.9]   . Tobacco abuse [Z72.0]   . History of cocaine abuse [Z87.898]   . History of ETOH abuse [Z87.898]   . Bradycardia [R00.1]   . Syncope [R55]    Total Time spent with patient: 15 minutes  Past Psychiatric History:   Past Medical History:  Past Medical History:  Diagnosis Date  . Abnormal ECG    a. early repolarization  . Arthritis    "my whole left side" (05/03/2014)  . Bipolar disorder ([place])   . Bradycardia    a. asymptomatic  . CAD in native artery    a. Nonobstructive cath 11/2007;  b. Presented with ST elevation - Nonobstructive cath 08/2011  . Chest pain, mid sternal   . Coronary artery disease   . GERD (gastroesophageal reflux disease)   . History of cocaine abuse    a. quit ? 2009  . History of ETOH abuse    a. drinks 2 "40's" / wk  . Marijuana abuse    a. uses ~ 1x /wk or less  . Pneumonia   . Stomach ulcer   . Syncope    a. 12/2010 - presumed to be vasovagal  . Tobacco abuse  Past Surgical History:  Procedure Laterality Date  . CARDIAC CATHETERIZATION  2009; 08/2011   [name] 08/06/2011  . LEFT HEART CATHETERIZATION WITH CORONARY ANGIOGRAM N/A 08/16/2011   Procedure: LEFT HEART CATHETERIZATION WITH CORONARY ANGIOGRAM;  Surgeon: [name], MD;  Location: [facility] CATH LAB;  Service: Cardiovascular;  Laterality: N/A;   Family History: History reviewed. No pertinent family history. Family  Psychiatric  History:  Social History:  Social History   Substance and Sexual Activity  Alcohol Use Yes  . Alcohol/week: 48.0 standard drinks  . Types: 48 Cans of beer per week   Comment: 2 40 oz beers per day     Social History   Substance and Sexual Activity  Drug Use Yes  . Types: Marijuana, Cocaine    Social History   Socioeconomic History  . Marital status: Single    Spouse name: Not on file  . Number of children: Not on file  . Years of education: Not on file  . Highest education level: Not on file  Occupational History  . Not on file  Social Needs  . Financial resource strain: Not on file  . Food insecurity:    Worry: Not on file    Inability: Not on file  . Transportation needs:    Medical: Not on file    Non-medical: Not on file  Tobacco Use  . Smoking status: Current Some Day Smoker    Packs/day: 0.50    Years: 20.00    Pack years: 10.00    Types: Cigarettes    Last attempt to quit: 06/24/2015    Years since quitting: 2.6  . Smokeless tobacco: Never Used  Substance and Sexual Activity  . Alcohol use: Yes    Alcohol/week: 48.0 standard drinks    Types: 48 Cans of beer per week    Comment: 2 40 oz beers per day  . Drug use: Yes    Types: Marijuana, Cocaine  . Sexual activity: Yes  Lifestyle  . Physical activity:    Days per week: Not on file    Minutes per session: Not on file  . Stress: Not on file  Relationships  . Social connections:    Talks on phone: Not on file    Gets together: Not on file    Attends religious service: Not on file    Active member of club or organization: Not on file    Attends meetings of clubs or organizations: Not on file    Relationship status: Not on file  Other Topics Concern  . Not on file  Social History Narrative   Lives with his daughter currently and receives SSI.    Additional Social History:  Sleep: Good  Appetite:  Improving  Current Medications: Current Facility-Administered Medications   Medication Dose Route Frequency Provider Last Rate Last Dose  . alum & mag hydroxide-simeth (MAALOX/MYLANTA) 200-200-20 MG/5ML suspension 30 mL  30 mL Oral Q4H PRN Ethelene Hal, NP      . ARIPiprazole (ABILIFY) tablet 5 mg  5 mg Oral Daily Nanci Pina, FNP   5 mg at 02/13/18 5974  . escitalopram (LEXAPRO) tablet 5 mg  5 mg Oral Daily Nanci Pina, FNP   5 mg at 02/13/18 1638  . lidocaine (LIDODERM) 5 % 1 patch  1 patch Transdermal Q24H Nanci Pina, FNP   Stopped at 02/12/18 1309  . magnesium hydroxide (MILK OF MAGNESIA) suspension 30 mL  30 mL Oral Daily PRN Ethelene Hal, NP      .  multivitamin with minerals tablet 1 tablet  1 tablet Oral Daily Emry Barbato, Myer Peer, MD   1 tablet at 02/13/18 [phone]  . thiamine (VITAMIN B-1) tablet 100 mg  100 mg Oral Daily Zayyan Mullen, Myer Peer, MD   100 mg at 02/13/18 0821  . traZODone (DESYREL) tablet 50 mg  50 mg Oral QHS PRN,MR X 1 Laverle Hobby, PA-C   50 mg at 02/12/18 2216    Lab Results:  No results found for this or any previous visit (from the past 48 hour(s)).  Blood Alcohol level:  Lab Results  Component Value Date   ETH <10 02/08/2018   ETH 168 (H) 31/51/7616    Metabolic Disorder Labs: Lab Results  Component Value Date   HGBA1C 4.9 05/04/2016   MPG 94 05/04/2016   MPG 82 05/03/2014   No results found for: PROLACTIN Lab Results  Component Value Date   CHOL 170 05/04/2016   TRIG 155 (H) 05/04/2016   HDL 65 05/04/2016   CHOLHDL 2.6 05/04/2016   VLDL 31 05/04/2016   LDLCALC 74 05/04/2016   LDLCALC 42 05/03/2014    Physical Findings: AIMS: Facial and Oral Movements Muscles of Facial Expression: None, normal Lips and Perioral Area: None, normal Jaw: None, normal Tongue: None, normal,Extremity Movements Upper (arms, wrists, hands, fingers): None, normal Lower (legs, knees, ankles, toes): None, normal, Trunk Movements Neck, shoulders, hips: None, normal, Overall Severity Severity of abnormal movements  (highest score from questions above): None, normal Incapacitation due to abnormal movements: None, normal Patient's awareness of abnormal movements (rate only patient's report): No Awareness, Dental Status Current problems with teeth and/or dentures?: No Does patient usually wear dentures?: No  CIWA:  CIWA-Ar Total: 0 COWS:     Musculoskeletal: Strength & Muscle Tone: within normal limits Gait & Station: normal Patient leans: N/A  Psychiatric Specialty Exam: Physical Exam  ROS denies headache, no chest pain, no shortness of breath, no vomiting  Blood pressure 97/75, pulse (!) 57, temperature 97.9 F (36.6 C), temperature source Oral, resp. rate 18, height 5' 4.5" (1.638 m), weight 52.2 kg, SpO2 100 %.Body mass index is 19.43 kg/m.  General Appearance: Fairly Groomed  Eye Contact:  Good  Speech:  Normal Rate  Volume:  Decreased  Mood:  Euthymic-acknowledges mood much improved compared to admission  Affect:  Brighter, more reactive  Thought Process:  Coherent, Linear and Descriptions of Associations: Intact  Orientation:  Other:  Fully alert and attentive  Thought Content:  No hallucinations, no delusions  Suicidal Thoughts:  No denies suicidal or self-injurious ideations, presents future oriented, denies homicidal or violent ideations, contracts for safety on unit  Homicidal Thoughts:  No  Memory:  Recent and remote grossly intact  Judgement: Improving  Insight:  Fair-improving  Psychomotor Activity:  Normal-no current psychomotor agitation or restlessness, no distal tremors noted  Concentration:  Concentration: Good and Attention Span: Good  Recall:  Good  Fund of Knowledge:  Good  Language:  Good  Akathisia:  No  Handed:  Right  AIMS (if indicated):     Assets:  Communication Skills Desire for Improvement Leisure Time Physical Health  ADL's:  Intact  Cognition:  WNL  Sleep:  Number of Hours: 6.25   Assessment - 52 year old male, presented to ED due to worsening  depression, suicidal ideations of jumping off a bridge. Reports he has been depressed for several months after death of GF. Reports daily , heavy drinking and daily cannabis use .  Patient currently improved  compared to admission.  Reports improving mood, presents with a fuller range of affect.  Currently minimizes significant neuro vegetative symptoms of depression, denies suicidal ideations, and presents future oriented, expressing hope to go to a residential/rehab setting at discharge.  Tolerating medications well thus far. Treatment Plan Summary: Treatment plan reviewed as today 9/9 Encourage group and milieu participation to work on coping skills and symptom reduction Encourage efforts to work on sobriety and relapse prevention Treatment team working on disposition planning Continue Lexapro 5 mg daily for depression Continue Abilify 5 mg daily for augmentation/mood disorder Continue Trazodone 50 mg nightly for insomnia as needed Continue Lidoderm patch for pain as needed   Jenne Campus, MD 02/13/2018, 5:16 PM   Patient ID: [name], male   DOB: Jul 16, 1965, 52 y.o.   MRN: [id number]

## 2018-02-14 DIAGNOSIS — F332 Major depressive disorder, recurrent severe without psychotic features: Principal | ICD-10-CM

## 2018-02-14 MED ORDER — ESCITALOPRAM OXALATE 5 MG PO TABS: 5.0000 mg | ORAL_TABLET | Freq: Every day | ORAL | 0 refills | Status: DC

## 2018-02-14 MED ORDER — TRAZODONE HCL 50 MG PO TABS
50.0000 mg | ORAL_TABLET | Freq: Every evening | ORAL | Status: DC | PRN
  Filled 2018-02-14: qty 14

## 2018-02-14 MED ORDER — ARIPIPRAZOLE 5 MG PO TABS: 5.0000 mg | ORAL_TABLET | Freq: Every day | ORAL | 0 refills | Status: DC

## 2018-02-14 MED ORDER — TRAZODONE HCL 50 MG PO TABS: 50.0000 mg | ORAL_TABLET | Freq: Every evening | ORAL | 0 refills | Status: DC | PRN

## 2018-02-14 MED ORDER — TRAZODONE HCL 100 MG PO TABS
100.0000 mg | ORAL_TABLET | Freq: Every evening | ORAL | Status: DC | PRN
  Administered 2018-02-14: 100 mg via ORAL
  Filled 2018-02-14: qty 1

## 2018-02-14 NOTE — Progress Notes (Signed)
Adult Psychoeducational Group Note  Date:  02/14/2018 Time: 1600  Group Topic/Focus:  Coping With Mental Health Crisis:   The purpose of this group is to help patients identify strategies for coping with mental health crisis.  Group discusses possible causes of crisis and ways to manage them effectively.  Participation Level:  Did not attend.

## 2018-02-14 NOTE — Progress Notes (Signed)
  [facility] Adult Case Management Discharge Plan :  Will you be returning to the same living situation after discharge:  No.Pt has screening and possible admission to [facility] on 9/11.  At discharge, do you have transportation home?: Yes,  taxi voucher in chart. PT MUST DISCHARGE BY 7AM ON WED, 9/11 TO GET TO [place] SCREENING IN TIME. Do you have the ability to pay for your medications: Yes,  [facility]  Release of information consent forms completed and submitted to medical records by CSW.   Patient to Follow up at: Follow-up Information    Services, [facility]. Go on 02/15/2018.   Why:  Screening appointment is Wednesday, 02/15/18 at 7:45am. Please be sure to bring your Photo ID, proof of residency([place]), any insurance information, and any discharge paperwork from this hospitalization.  Contact information: [address] [place] [address] [phone]        [facility] Follow up on 02/20/2018.   Specialty:  Behavioral Health Why:  Hospital follow appointment is Monday, 02/20/18 at 8:15am. Please be sure to bring your Photo ID and any discharge paperwork from this hospitalization, including your medication lists. Contact information: [place] [address] [phone]           Next level of care provider has access to [place] and Suicide Prevention discussed: Yes,  SPE completed with pt; pt declined to consent to collateral contact. SPI pamphlet and Mobile Crisis information provided to pt.   Have you used any form of tobacco in the last 30 days? (Cigarettes, Smokeless Tobacco, Cigars, and/or Pipes): Yes  Has patient been referred to the Quitline?: Patient refused referral  Patient has been referred for addiction treatment: Yes  Avelina Laine, LCSW 02/14/2018, 11:08 AM

## 2018-02-14 NOTE — Progress Notes (Signed)
DAR NOTE: Patient presents with anxious affect and depressed mood.  Pt has been very isolative, spent most of the day sleep. Pt has been compliant with his medications and unit rules. Reports good sleep, good appetite, normal energy and good concentration. Denies pain, auditory and visual hallucinations.  Rates depression at 0, hopelessness at 0, and anxiety at 0.  Maintained on routine safety checks.  Medications given as prescribed.  Support and encouragement offered as needed.  States goal for today is " make it to all groups." Will continue to monitor.

## 2018-02-14 NOTE — Progress Notes (Signed)
D:  [name] [name] was up and in the day room all evening.  He was interacting well with staff and peers.  He was pleasant and cooperative.  He denied SI/HI or A/V hallucinations.  He reported that he is hoping to go to [facility] and has a meeting with them on Wednesday.  He was unsure if he is going there for the meeting or talking with someone on the phone.  Encouraged him to discuss this with the Education officer, museum tomorrow.  He attended evening wrap up group.  He took prn trazodone with repeat for sleep. A:  1:1 with RN for support and encouragement.  Medications as ordered.  Q 15 minute checks maintained for safety.  Encouraged participation in group and unit activities.   R:  Zeric remains safe on the unit.  We will continue to monitor the progress towards her goals.

## 2018-02-14 NOTE — Plan of Care (Signed)
  Problem: Safety: Goal: Ability to remain free from injury will improve Outcome: Progressing   Problem: Education: Goal: Ability to make informed decisions regarding treatment will improve Outcome: Progressing   Problem: Coping: Goal: Coping ability will improve Outcome: Progressing   Problem: Medication: Goal: Compliance with prescribed medication regimen will improve Outcome: Progressing

## 2018-02-14 NOTE — Progress Notes (Signed)
[facility] MD Progress Note  02/14/2018 2:45 PM [name] [name]  MRN:  [id number] Subjective: Patient is seen and examined.  Patient is a 52 year old male with a past psychiatric history significant for major depression, posttraumatic stress disorder, alcohol dependence, alcohol induced mood disorder, cocaine use disorder and cannabis use disorder.  He seen in follow-up.  He is doing well.  He denied any problems today.  He denied any suicidal ideation.  He denied any psychosis.  He denied any side effects to his current medications.  He continues on Abilify, Lexapro, thiamine and trazodone. Principal Problem: <principal problem not specified> Diagnosis:   Patient Active Problem List   Diagnosis Date Noted  . MDD (major depressive disorder), recurrent severe, without psychosis ([place]) [F33.2] 02/08/2018  . Major depressive disorder, recurrent episode, severe with anxious distress ([place]) [F33.2] 05/23/2016  . PTSD (post-traumatic stress disorder) [F43.10] 05/23/2016  . Cocaine use disorder, moderate, in early remission ([place]) [F14.21] 05/23/2016  . Alcohol use disorder, moderate, dependence ([place]) [F10.20] 05/23/2016  . Affective psychosis, bipolar ([place]) [F31.9] 04/30/2016  . Cocaine abuse with cocaine-induced mood disorder ([place]) [F14.14] 03/04/2016  . Alcohol dependence with uncomplicated withdrawal ([place]) [F10.230]   . Alcohol use disorder, severe, dependence ([place]) [F10.20] 01/18/2015  . Alcohol dependence with alcohol-induced mood disorder ([place]) [F10.24]   . Suicidal ideation [R45.851]   . Acute pancreatitis [K85.90] 03/20/2012  . Polysubstance abuse ([place]) [F19.10] 03/20/2012  . Cannabis abuse [F12.10] 01/12/2012  . Alcohol dependence ([place]) [F10.20] 10/15/2011  . Abnormal ECG [R94.31]   . Chest pain [R07.89]   . CAD in native artery [I25.10]   . GERD (gastroesophageal reflux disease) [K21.9]   . Tobacco abuse [Z72.0]   . History of cocaine abuse [Z87.898]   . History of ETOH abuse [Z87.898]   .  Bradycardia [R00.1]   . Syncope [R55]    Total Time spent with patient: 15 minutes  Past Psychiatric History: See admission H&P  Past Medical History:  Past Medical History:  Diagnosis Date  . Abnormal ECG    a. early repolarization  . Arthritis    "my whole left side" (05/03/2014)  . Bipolar disorder ([place])   . Bradycardia    a. asymptomatic  . CAD in native artery    a. Nonobstructive cath 11/2007;  b. Presented with ST elevation - Nonobstructive cath 08/2011  . Chest pain, mid sternal   . Coronary artery disease   . GERD (gastroesophageal reflux disease)   . History of cocaine abuse    a. quit ? 2009  . History of ETOH abuse    a. drinks 2 "40's" / wk  . Marijuana abuse    a. uses ~ 1x /wk or less  . Pneumonia   . Stomach ulcer   . Syncope    a. 12/2010 - presumed to be vasovagal  . Tobacco abuse     Past Surgical History:  Procedure Laterality Date  . CARDIAC CATHETERIZATION  2009; 08/2011   [name] 08/06/2011  . LEFT HEART CATHETERIZATION WITH CORONARY ANGIOGRAM N/A 08/16/2011   Procedure: LEFT HEART CATHETERIZATION WITH CORONARY ANGIOGRAM;  Surgeon: [name], MD;  Location: [facility] CATH LAB;  Service: Cardiovascular;  Laterality: N/A;   Family History: History reviewed. No pertinent family history. Family Psychiatric  History: See admission H&P Social History:  Social History   Substance and Sexual Activity  Alcohol Use Yes  . Alcohol/week: 48.0 standard drinks  . Types: 48 Cans of beer per week   Comment: 2 40 oz  beers per day     Social History   Substance and Sexual Activity  Drug Use Yes  . Types: Marijuana, Cocaine    Social History   Socioeconomic History  . Marital status: Single    Spouse name: Not on file  . Number of children: Not on file  . Years of education: Not on file  . Highest education level: Not on file  Occupational History  . Not on file  Social Needs  . Financial resource strain: Not on file  . Food insecurity:    Worry:  Not on file    Inability: Not on file  . Transportation needs:    Medical: Not on file    Non-medical: Not on file  Tobacco Use  . Smoking status: Current Some Day Smoker    Packs/day: 0.50    Years: 20.00    Pack years: 10.00    Types: Cigarettes    Last attempt to quit: 06/24/2015    Years since quitting: 2.6  . Smokeless tobacco: Never Used  Substance and Sexual Activity  . Alcohol use: Yes    Alcohol/week: 48.0 standard drinks    Types: 48 Cans of beer per week    Comment: 2 40 oz beers per day  . Drug use: Yes    Types: Marijuana, Cocaine  . Sexual activity: Yes  Lifestyle  . Physical activity:    Days per week: Not on file    Minutes per session: Not on file  . Stress: Not on file  Relationships  . Social connections:    Talks on phone: Not on file    Gets together: Not on file    Attends religious service: Not on file    Active member of club or organization: Not on file    Attends meetings of clubs or organizations: Not on file    Relationship status: Not on file  Other Topics Concern  . Not on file  Social History Narrative   Lives with his daughter currently and receives SSI.    Additional Social History:                         Sleep: Fair  Appetite:  Fair  Current Medications: Current Facility-Administered Medications  Medication Dose Route Frequency Provider Last Rate Last Dose  . alum & mag hydroxide-simeth (MAALOX/MYLANTA) 200-200-20 MG/5ML suspension 30 mL  30 mL Oral Q4H PRN Ethelene Hal, NP      . ARIPiprazole (ABILIFY) tablet 5 mg  5 mg Oral Daily Nanci Pina, FNP   5 mg at 02/14/18 7342  . escitalopram (LEXAPRO) tablet 5 mg  5 mg Oral Daily Nanci Pina, FNP   5 mg at 02/14/18 8768  . lidocaine (LIDODERM) 5 % 1 patch  1 patch Transdermal Q24H Nanci Pina, FNP   Stopped at 02/12/18 1309  . magnesium hydroxide (MILK OF MAGNESIA) suspension 30 mL  30 mL Oral Daily PRN Ethelene Hal, NP      . multivitamin  with minerals tablet 1 tablet  1 tablet Oral Daily Cobos, Myer Peer, MD   1 tablet at 02/14/18 [phone]  . thiamine (VITAMIN B-1) tablet 100 mg  100 mg Oral Daily Cobos, Myer Peer, MD   100 mg at 02/14/18 0821  . traZODone (DESYREL) tablet 50 mg  50 mg Oral QHS PRN,MR X 1 Laverle Hobby, PA-C   50 mg at 02/13/18 2237    Lab Results:  No results found for this or any previous visit (from the past 48 hour(s)).  Blood Alcohol level:  Lab Results  Component Value Date   ETH <10 02/08/2018   ETH 168 (H) 29/51/8841    Metabolic Disorder Labs: Lab Results  Component Value Date   HGBA1C 4.9 05/04/2016   MPG 94 05/04/2016   MPG 82 05/03/2014   No results found for: PROLACTIN Lab Results  Component Value Date   CHOL 170 05/04/2016   TRIG 155 (H) 05/04/2016   HDL 65 05/04/2016   CHOLHDL 2.6 05/04/2016   VLDL 31 05/04/2016   LDLCALC 74 05/04/2016   LDLCALC 42 05/03/2014    Physical Findings: AIMS: Facial and Oral Movements Muscles of Facial Expression: None, normal Lips and Perioral Area: None, normal Jaw: None, normal Tongue: None, normal,Extremity Movements Upper (arms, wrists, hands, fingers): None, normal Lower (legs, knees, ankles, toes): None, normal, Trunk Movements Neck, shoulders, hips: None, normal, Overall Severity Severity of abnormal movements (highest score from questions above): None, normal Incapacitation due to abnormal movements: None, normal Patient's awareness of abnormal movements (rate only patient's report): No Awareness, Dental Status Current problems with teeth and/or dentures?: No Does patient usually wear dentures?: No  CIWA:  CIWA-Ar Total: 0 COWS:     Musculoskeletal: Strength & Muscle Tone: within normal limits Gait & Station: normal Patient leans: N/A  Psychiatric Specialty Exam: Physical Exam  Nursing note and vitals reviewed. Constitutional: He is oriented to person, place, and time. He appears well-developed and well-nourished.  HENT:   Head: Normocephalic and atraumatic.  Respiratory: Effort normal.  Neurological: He is alert and oriented to person, place, and time.    ROS  Blood pressure 124/77, pulse (!) 49, temperature 97.6 F (36.4 C), temperature source Oral, resp. rate 18, height 5' 4.5" (1.638 m), weight 52.2 kg, SpO2 100 %.Body mass index is 19.43 kg/m.  General Appearance: Casual  Eye Contact:  Good  Speech:  Normal Rate  Volume:  Normal  Mood:  Euthymic  Affect:  Congruent  Thought Process:  Coherent and Descriptions of Associations: Intact  Orientation:  Full (Time, Place, and Person)  Thought Content:  Logical  Suicidal Thoughts:  No  Homicidal Thoughts:  No  Memory:  Immediate;   Fair Recent;   Fair Remote;   Fair  Judgement:  Intact  Insight:  Fair  Psychomotor Activity:  Normal  Concentration:  Concentration: Fair and Attention Span: Fair  Recall:  [employer] of Knowledge:  Fair  Language:  Fair  Akathisia:  Negative  Handed:  Right  AIMS (if indicated):     Assets:  Desire for Improvement Resilience  ADL's:  Intact  Cognition:  WNL  Sleep:  Number of Hours: 6.25     Treatment Plan Summary: Daily contact with patient to assess and evaluate symptoms and progress in treatment, Medication management and Plan : Patient is seen and examined.  Patient is a 52 year old male with the above-stated past psychiatric history seen in follow-up. 1.  Alcohol dependence/multiple substance use disorders-patient has been accepted into a residential substance abuse treatment program.  We will plan on discharge tomorrow to that facility.  #2 depression-he continues on Abilify, Lexapro, thiamine and trazodone.. #3 disposition/discharge planning-patient to go to residential substance abuse treatment program on 9/11.  Sharma Covert, MD 02/14/2018, 2:45 PM

## 2018-02-14 NOTE — Plan of Care (Signed)
  Problem: Activity: Goal: Interest or engagement in activities will improve Outcome: Progressing Note:  Pt reported that he was able to stay awake all day and attended groups/unit activities.   Problem: Activity: Goal: Sleeping patterns will improve Outcome: Progressing Note:  He reported sleeping better last night with trazodone added.

## 2018-02-14 NOTE — BHH Group Notes (Signed)
[facility] Group Therapy 02/14/2018 1:15pm  Type of Therapy: Mental Health Association Presentation  Participation Level: Invited. Chose to remain in bed.   Avelina Laine, LCSW 02/14/2018 1:49 PM

## 2018-02-14 NOTE — Discharge Summary (Signed)
Physician Discharge Summary Note  Patient:  [name] [name] is an 52 y.o., male MRN:  [id number] DOB:  11/20/1965  Patient phone:  [phone] (home)   Patient address:   [address] [name] [address],  Total Time spent with patient: Greater than 30 minutes  Date of Admission:  02/08/2018  Date of Discharge: 02/15/2018  Reason for Admission:   Principal Problem: MDD (major depressive disorder), recurrent severe, without psychosis [facility])  Discharge Diagnoses: Patient Active Problem List   Diagnosis Date Noted  . MDD (major depressive disorder), recurrent severe, without psychosis ([place]) [F33.2] 02/08/2018    Priority: High  . Alcohol dependence with alcohol-induced mood disorder ([place]) [F10.24]     Priority: High  . Cocaine abuse with cocaine-induced mood disorder [facility]) [F14.14] 03/04/2016    Priority: Medium  . Major depressive disorder, recurrent episode, severe with anxious distress ([place]) [F33.2] 05/23/2016  . PTSD (post-traumatic stress disorder) [F43.10] 05/23/2016  . Cocaine use disorder, moderate, in early remission ([place]) [F14.21] 05/23/2016  . Alcohol use disorder, moderate, dependence ([place]) [F10.20] 05/23/2016  . Affective psychosis, bipolar ([place]) [F31.9] 04/30/2016  . Alcohol dependence with uncomplicated withdrawal ([place]) [F10.230]   . Alcohol use disorder, severe, dependence ([place]) [F10.20] 01/18/2015  . Suicidal ideation [R45.851]   . Acute pancreatitis [K85.90] 03/20/2012  . Polysubstance abuse ([place]) [F19.10] 03/20/2012  . Cannabis abuse [F12.10] 01/12/2012  . Alcohol dependence ([place] [employer]) [F10.20] 10/15/2011  . Abnormal ECG [R94.31]   . Chest pain [R07.89]   . CAD in native artery [I25.10]   . GERD (gastroesophageal reflux disease) [K21.9]   . Tobacco abuse [Z72.0]   . History of cocaine abuse [Z87.898]   . History of ETOH abuse [Z87.898]   . Bradycardia [R00.1]   . Syncope [R55]    Past Psychiatric History: See H&P  Past Medical History:  Past  Medical History:  Diagnosis Date  . Abnormal ECG    a. early repolarization  . Arthritis    "my whole left side" (05/03/2014)  . Bipolar disorder ([place])   . Bradycardia    a. asymptomatic  . CAD in native artery    a. Nonobstructive cath 11/2007;  b. Presented with ST elevation - Nonobstructive cath 08/2011  . Chest pain, mid sternal   . Coronary artery disease   . GERD (gastroesophageal reflux disease)   . History of cocaine abuse    a. quit ? 2009  . History of ETOH abuse    a. drinks 2 "40's" / wk  . Marijuana abuse    a. uses ~ 1x /wk or less  . Pneumonia   . Stomach ulcer   . Syncope    a. 12/2010 - presumed to be vasovagal  . Tobacco abuse     Past Surgical History:  Procedure Laterality Date  . CARDIAC CATHETERIZATION  2009; 08/2011   [name] 08/06/2011  . LEFT HEART CATHETERIZATION WITH CORONARY ANGIOGRAM N/A 08/16/2011   Procedure: LEFT HEART CATHETERIZATION WITH CORONARY ANGIOGRAM;  Surgeon: [name], MD;  Location: [facility] CATH LAB;  Service: Cardiovascular;  Laterality: N/A;   Family History: History reviewed. No pertinent family history.  Family Psychiatric  History: See H&P  Social History:  Social History   Substance and Sexual Activity  Alcohol Use Yes  . Alcohol/week: 48.0 standard drinks  . Types: 48 Cans of beer per week   Comment: 2 40 oz beers per day     Social History   Substance and Sexual Activity  Drug Use Yes  .  Types: Marijuana, Cocaine    Social History   Socioeconomic History  . Marital status: Single    Spouse name: Not on file  . Number of children: Not on file  . Years of education: Not on file  . Highest education level: Not on file  Occupational History  . Not on file  Social Needs  . Financial resource strain: Not on file  . Food insecurity:    Worry: Not on file    Inability: Not on file  . Transportation needs:    Medical: Not on file    Non-medical: Not on file  Tobacco Use  . Smoking status: Current Some Day  Smoker    Packs/day: 0.50    Years: 20.00    Pack years: 10.00    Types: Cigarettes    Last attempt to quit: 06/24/2015    Years since quitting: 2.6  . Smokeless tobacco: Never Used  Substance and Sexual Activity  . Alcohol use: Yes    Alcohol/week: 48.0 standard drinks    Types: 48 Cans of beer per week    Comment: 2 40 oz beers per day  . Drug use: Yes    Types: Marijuana, Cocaine  . Sexual activity: Yes  Lifestyle  . Physical activity:    Days per week: Not on file    Minutes per session: Not on file  . Stress: Not on file  Relationships  . Social connections:    Talks on phone: Not on file    Gets together: Not on file    Attends religious service: Not on file    Active member of club or organization: Not on file    Attends meetings of clubs or organizations: Not on file    Relationship status: Not on file  Other Topics Concern  . Not on file  Social History Narrative   Lives with his daughter currently and receives SSI.    Hospital Course: (Per Md's admission assessment): 52 year old male. Reports he went to [facility], and was referred to [facility] ED. Presented voluntarily. Reports he has been feeling depressed over recent months, but feels his mood has worsened over recent days. He reports he has been experiencing suicidal ideations,with thoughts of jumping off a bridge. He reports history of alcohol use disorder, and has been drinking up to 5 large ( 40 ounce) beers on most days. He states he has been blacking out frequently . Of note, admission BAL negative, admission UDS positive for cannabis. Attributes depression to death of his GF , who passed away July 11, 2022. Endorses neuro-vegetative symptoms of depression. Reports auditory hallucinations ( only when actively drinking) which tell him " different things, like " go speak to that person"   [name] [name] was admitted to the hospital with his UDS reports showing positive THC. His BAL was <10 per toxicology tests results. He was here to restart  his mental health medications to re-stabilize his mood as he has been feeling very depressed over the recent months. He was also presenting with suicidal ideations. After evaluation of his symptoms, [name] [name] received & was discharged on; Abilify 5 mg po for mood control, Lexapro 5 mg po for depression & Trazodone 50 mg prn for insomnia. He was also enrolled in the group counseling sessions, AA/NA meetings being offered and held on this unit. He participated and learned coping skills. He tolerated his treatment regimen without any significant adverse effects and or reactions.  [name] [name]'s symptoms responded well to his treatment regimen.This is evidenced by  his reports of improved mood and absence of suicidal thoughts or ideations. He is currently being discharged to continue substance abuse treatment at the [facility] treatment center. He will resume psychiatric care and routine medication management at the [facility] clinic here in [place], [name]. He was encouraged to join/attend AA/NA meetings being offered and held within his community to achieve & maintain maximum sobriety.   Upon discharge, [name] [name] adamantly denies any suicidal, homicidal ideations, auditory, visual hallucinations, delusional thoughts, paranoia & or substance withdrawal symptoms. He left [facility] with all personal belongings in no apparent distress.     Physical Findings: AIMS: Facial and Oral Movements Muscles of Facial Expression: None, normal Lips and Perioral Area: None, normal Jaw: None, normal Tongue: None, normal,Extremity Movements Upper (arms, wrists, hands, fingers): None, normal Lower (legs, knees, ankles, toes): None, normal, Trunk Movements Neck, shoulders, hips: None, normal, Overall Severity Severity of abnormal movements (highest score from questions above): None, normal Incapacitation due to abnormal movements: None, normal Patient's awareness of abnormal movements (rate only patient's report): No Awareness, Dental  Status Current problems with teeth and/or dentures?: No Does patient usually wear dentures?: No  CIWA:  CIWA-Ar Total: 0 COWS:     Musculoskeletal: Strength & Muscle Tone: within normal limits Gait & Station: normal Patient leans: N/A  Psychiatric Specialty Exam: Physical Exam  Nursing note and vitals reviewed. Constitutional: He is oriented to person, place, and time. He appears well-developed.  HENT:  Head: Normocephalic.  Eyes: Pupils are equal, round, and reactive to light.  Neck: Normal range of motion.  Cardiovascular: Normal rate.  Respiratory: Effort normal.  GI: Soft.  Genitourinary:  Genitourinary Comments: Denies any issues in this area.  Musculoskeletal: Normal range of motion.  Neurological: He is alert and oriented to person, place, and time.  Skin: Skin is warm and dry.  Psychiatric: His affect is not labile. He is not agitated. Thought content is not paranoid and not delusional. Cognition and memory are not impaired. He does not exhibit a depressed mood. He expresses no homicidal and no suicidal ideation.    Review of Systems  Constitutional: Negative.  Negative for fever.  HENT: Negative.   Eyes: Negative.  Negative for blurred vision.  Respiratory: Negative.  Negative for cough.   Cardiovascular: Negative.  Negative for chest pain.  Gastrointestinal: Negative.  Negative for heartburn.  Genitourinary: Negative.  Negative for dysuria.  Musculoskeletal: Negative.  Negative for myalgias.  Skin: Negative.  Negative for rash.  Neurological: Negative.  Negative for dizziness and headaches.  Endo/Heme/Allergies: Negative.   Psychiatric/Behavioral: Positive for depression (Stable) and substance abuse (Hx. alcoholism, chronic). Negative for hallucinations, memory loss and suicidal ideas. The patient has insomnia (Stable). The patient is not nervous/anxious.   All other systems reviewed and are negative.   Blood pressure 124/77, pulse (!) 49, temperature 97.6 F  (36.4 C), temperature source Oral, resp. rate 18, height 5' 4.5" (1.638 m), weight 52.2 kg, SpO2 100 %.Body mass index is 19.43 kg/m.  See Md's SRA.  Have you used any form of tobacco in the last 30 days? (Cigarettes, Smokeless Tobacco, Cigars, and/or Pipes): Yes  Has this patient used any form of tobacco in the last 30 days? (Cigarettes, Smokeless Tobacco, Cigars, and/or Pipes): Yes, Nicotine patch prescription given provided.  Blood Alcohol level:  Lab Results  Component Value Date   ETH <10 02/08/2018   ETH 168 (H) 67/89/3810   Metabolic Disorder Labs:  Lab Results  Component Value Date   HGBA1C 4.9 05/04/2016  MPG 94 05/04/2016   MPG 82 05/03/2014   No results found for: PROLACTIN Lab Results  Component Value Date   CHOL 170 05/04/2016   TRIG 155 (H) 05/04/2016   HDL 65 05/04/2016   CHOLHDL 2.6 05/04/2016   VLDL 31 05/04/2016   LDLCALC 74 05/04/2016   LDLCALC 42 05/03/2014   See Psychiatric Specialty Exam and Suicide Risk Assessment completed by Attending Physician prior to discharge.  Discharge destination:  [facility]  Is patient on multiple antipsychotic therapies at discharge:  No   Has Patient had three or more failed trials of antipsychotic monotherapy by history:  No  Recommended Plan for Multiple Antipsychotic Therapies: NA  Allergies as of 02/14/2018      Reactions   Aspirin Other (See Comments)   Aggravates ulcer. "Causes chest pain."   Pepperoni [pickled Meat] Other (See Comments)   aggrevates ulcer   Tomato Other (See Comments)   Foods with tomato sauce aggrevate ulcers   Tylenol [acetaminophen] Other (See Comments)   Pt reports hx of ulcers      Medication List    TAKE these medications     Indication  ARIPiprazole 5 MG tablet Commonly known as:  ABILIFY Take 1 tablet (5 mg total) by mouth daily. For mood control Start taking on:  02/15/2018  Indication:  Mood control   escitalopram 5 MG tablet Commonly known as:  LEXAPRO Take  1 tablet (5 mg total) by mouth daily. For depression Start taking on:  02/15/2018  Indication:  Major Depressive Disorder   traZODone 50 MG tablet Commonly known as:  DESYREL Take 1 tablet (50 mg total) by mouth at bedtime as needed for sleep.  Indication:  Trouble Sleeping      Follow-up Information    Services, [facility]. Go on 02/15/2018.   Why:  Screening appointment is Wednesday, 02/15/18 at 7:45am. Please be sure to bring your Photo ID, proof of residency([place]), any insurance information, and any discharge paperwork from this hospitalization.  Contact information: [address] [place] [address] [phone]        [facility] Follow up on 02/20/2018.   Specialty:  Behavioral Health Why:  Hospital follow appointment is Monday, 02/20/18 at 8:15am. Please be sure to bring your Photo ID and any discharge paperwork from this hospitalization, including your medication lists. Contact information: [place] [address] [phone]          Follow-up recommendations: Activity:  As tolerated Diet: As recommended by your primary care doctor. Keep all scheduled follow-up appointments as recommended.   Comments: Patient is instructed prior to discharge to: Take all medications as prescribed by his/her mental healthcare provider. Report any adverse effects and or reactions from the medicines to his/her outpatient provider promptly. Patient has been instructed & cautioned: To not engage in alcohol and or illegal drug use while on prescription medicines. In the event of worsening symptoms, patient is instructed to call the crisis hotline, 911 and or go to the nearest ED for appropriate evaluation and treatment of symptoms. To follow-up with his/her primary care provider for your other medical issues, concerns and or health care needs.   Signed: Lindell Spar, NP PMHNP-BC 02/14/2018, 3:16 PM

## 2018-02-14 NOTE — BHH Suicide Risk Assessment (Signed)
[facility] Discharge Suicide Risk Assessment   Principal Problem: MDD (major depressive disorder), recurrent severe, without psychosis ([place]) Discharge Diagnoses:  Patient Active Problem List   Diagnosis Date Noted  . MDD (major depressive disorder), recurrent severe, without psychosis ([place]) [F33.2] 02/08/2018  . Major depressive disorder, recurrent episode, severe with anxious distress ([place]) [F33.2] 05/23/2016  . PTSD (post-traumatic stress disorder) [F43.10] 05/23/2016  . Cocaine use disorder, moderate, in early remission ([place]) [F14.21] 05/23/2016  . Alcohol use disorder, moderate, dependence ([place]) [F10.20] 05/23/2016  . Affective psychosis, bipolar ([place]) [F31.9] 04/30/2016  . Cocaine abuse with cocaine-induced mood disorder ([place]) [F14.14] 03/04/2016  . Alcohol dependence with uncomplicated withdrawal ([place]) [F10.230]   . Alcohol use disorder, severe, dependence ([place]) [F10.20] 01/18/2015  . Alcohol dependence with alcohol-induced mood disorder ([place]) [F10.24]   . Suicidal ideation [R45.851]   . Acute pancreatitis [K85.90] 03/20/2012  . Polysubstance abuse ([place]) [F19.10] 03/20/2012  . Cannabis abuse [F12.10] 01/12/2012  . Alcohol dependence ([place]) [F10.20] 10/15/2011  . Abnormal ECG [R94.31]   . Chest pain [R07.89]   . CAD in native artery [I25.10]   . GERD (gastroesophageal reflux disease) [K21.9]   . Tobacco abuse [Z72.0]   . History of cocaine abuse [Z87.898]   . History of ETOH abuse [Z87.898]   . Bradycardia [R00.1]   . Syncope [R55]     Total Time spent with patient: 15 minutes  Musculoskeletal: Strength & Muscle Tone: within normal limits Gait & Station: normal Patient leans: N/A  Psychiatric Specialty Exam: Review of Systems  All other systems reviewed and are negative.   Blood pressure 124/77, pulse (!) 49, temperature 97.6 F (36.4 C), temperature source Oral, resp. rate 18, height 5' 4.5" (1.638 m), weight 52.2 kg, SpO2 100 %.Body mass index is 19.43 kg/m.  General  Appearance: Casual  Eye Contact::  Fair  Speech:  Normal Rate409  Volume:  Normal  Mood:  Euthymic  Affect:  Appropriate  Thought Process:  Coherent and Descriptions of Associations: Intact  Orientation:  Full (Time, Place, and Person)  Thought Content:  Logical  Suicidal Thoughts:  No  Homicidal Thoughts:  No  Memory:  Immediate;   Fair Recent;   Fair Remote;   Fair  Judgement:  Intact  Insight:  Fair  Psychomotor Activity:  Normal  Concentration:  Fair  Recall:  [employer] of Knowledge:Fair  Language: Fair  Akathisia:  Negative  Handed:  Right  AIMS (if indicated):     Assets:  Communication Skills Desire for Improvement Resilience  Sleep:  Number of Hours: 6.25  Cognition: WNL  ADL's:  Intact   Mental Status Per Nursing Assessment::   On Admission:  Suicidal ideation indicated by patient, Self-harm thoughts  Demographic Factors:  Male, Low socioeconomic status, Living alone and Unemployed  Loss Factors: NA  Historical Factors: Impulsivity  Risk Reduction Factors:   NA  Continued Clinical Symptoms:  Depression:   Comorbid alcohol abuse/dependence Impulsivity Alcohol/Substance Abuse/Dependencies  Cognitive Features That Contribute To Risk:  None    Suicide Risk:  Minimal: No identifiable suicidal ideation.  Patients presenting with no risk factors but with morbid ruminations; may be classified as minimal risk based on the severity of the depressive symptoms  Follow-up Information    Services, [facility]. Go on 02/15/2018.   Why:  Screening appointment is Wednesday, 02/15/18 at 7:45am. Please be sure to bring your Photo ID, proof of residency([place]), any insurance information, and any discharge paperwork from this hospitalization.  Contact information: [address] [name] [address] [phone]        [facility] Follow up on 02/20/2018.   Specialty:  Behavioral Health Why:  Hospital follow appointment is Monday, 02/20/18 at 8:15am.  Please be sure to bring your Photo ID and any discharge paperwork from this hospitalization, including your medication lists. Contact information: [name] [address] [phone]           Plan Of Care/Follow-up recommendations:  Activity:  ad lib  Sharma Covert, MD 02/14/2018, 4:52 PM

## 2018-02-15 NOTE — Progress Notes (Signed)
D:  [name] [name] has been up and visible in the day room all evening.  He attended evening wrap up group.  He denied SI/HI or A/V hallucinations.  He denied any pain or discomfort.  He requested to have both his trazodone (total 100mg ) to be given at one time.  Staffed with Corene Cornea NP and new orders noted.  Trazodone given at hs.  He stated that he had a good day and is looking forward to going to [facility] in the morning.  D/C paperwork reviewed.  A:  1:1 with RN for support and encouragement.  Medications as ordered.  Q 15 minute checks maintained for safety.  Encouraged participation in group and unit activities.   R:  Zadiel remains safe on the unit.  We will continue to monitor the progress towards his goals.

## 2018-02-15 NOTE — Progress Notes (Signed)
Pt. D/C from the unit to lobby to ride a cab to [facility].  He was pleasant and cooperative. He voiced no SI/HI or A/V halluciations.  He denies any pain or discomfort.  D/C instructions and medications reviewed with pt.  Pt. verbalized understanding of medications and d/c instructions.   All belongings (from locker 27) returned to pt. Q 15 min checks maintained until discharge.  Chaim left the unit in no apparent distress.

## 2018-02-15 NOTE — Plan of Care (Addendum)
D/C 02/15/18-Care plan completed

## 2018-02-16 ENCOUNTER — Emergency Department (HOSPITAL_COMMUNITY)
Admission: EM | Admit: 2018-02-16 | Discharge: 2018-02-16 | Payer: Medicaid Other | Attending: Emergency Medicine | Admitting: Emergency Medicine

## 2018-02-16 ENCOUNTER — Encounter (HOSPITAL_COMMUNITY): Payer: Self-pay | Admitting: Emergency Medicine

## 2018-02-16 ENCOUNTER — Other Ambulatory Visit: Payer: Self-pay

## 2018-02-16 ENCOUNTER — Emergency Department (HOSPITAL_COMMUNITY): Payer: Medicaid Other

## 2018-02-16 DIAGNOSIS — F17210 Nicotine dependence, cigarettes, uncomplicated: Secondary | ICD-10-CM | POA: Diagnosis not present

## 2018-02-16 DIAGNOSIS — R42 Dizziness and giddiness: Secondary | ICD-10-CM | POA: Insufficient documentation

## 2018-02-16 DIAGNOSIS — R001 Bradycardia, unspecified: Secondary | ICD-10-CM | POA: Diagnosis not present

## 2018-02-16 DIAGNOSIS — I2510 Atherosclerotic heart disease of native coronary artery without angina pectoris: Secondary | ICD-10-CM | POA: Diagnosis not present

## 2018-02-16 LAB — CBC WITH DIFFERENTIAL/PLATELET
Abs Immature Granulocytes: 0 10*3/uL (ref 0.0–0.1)
BASOS ABS: 0 10*3/uL (ref 0.0–0.1)
Basophils Relative: 1 %
Eosinophils Absolute: 0.1 10*3/uL (ref 0.0–0.7)
Eosinophils Relative: 2 %
HCT: 37.6 % — ABNORMAL LOW (ref 39.0–52.0)
HEMOGLOBIN: 12.7 g/dL — AB (ref 13.0–17.0)
Immature Granulocytes: 0 %
LYMPHS PCT: 43 %
Lymphs Abs: 1.8 10*3/uL (ref 0.7–4.0)
MCH: 32.2 pg (ref 26.0–34.0)
MCHC: 33.8 g/dL (ref 30.0–36.0)
MCV: 95.2 fL (ref 78.0–100.0)
Monocytes Absolute: 0.6 10*3/uL (ref 0.1–1.0)
Monocytes Relative: 13 %
NEUTROS ABS: 1.8 10*3/uL (ref 1.7–7.7)
Neutrophils Relative %: 41 %
Platelets: 224 10*3/uL (ref 150–400)
RBC: 3.95 MIL/uL — ABNORMAL LOW (ref 4.22–5.81)
RDW: 12.4 % (ref 11.5–15.5)
WBC: 4.4 10*3/uL (ref 4.0–10.5)

## 2018-02-16 LAB — BASIC METABOLIC PANEL
ANION GAP: 7 (ref 5–15)
BUN: 11 mg/dL (ref 6–20)
CO2: 28 mmol/L (ref 22–32)
Calcium: 8.9 mg/dL (ref 8.9–10.3)
Chloride: 104 mmol/L (ref 98–111)
Creatinine, Ser: 1.11 mg/dL (ref 0.61–1.24)
GFR calc Af Amer: >60 mL/min (ref >60)
Glucose, Bld: 93 mg/dL (ref 70–99)
POTASSIUM: 4.5 mmol/L (ref 3.5–5.1)
SODIUM: 139 mmol/L (ref 135–145)

## 2018-02-16 LAB — I-STAT TROPONIN, ED: TROPONIN I, POC: 0.02 ng/mL (ref 0.00–0.08)

## 2018-02-16 MED ORDER — SODIUM CHLORIDE 0.9 % IV BOLUS
1000.0000 mL | Freq: Once | INTRAVENOUS | Status: AC
  Administered 2018-02-16: 1000 mL via INTRAVENOUS

## 2018-02-16 NOTE — ED Notes (Signed)
Report given to [facility] facility. [facility] will pick pt up from waiting room.

## 2018-02-16 NOTE — Discharge Instructions (Signed)
Please follow up with cardiology after you are discharged from [facility] Return here if your symptoms are worsening

## 2018-02-16 NOTE — ED Provider Notes (Addendum)
[facility] EMERGENCY DEPARTMENT Provider Note   CSN: [id number] Arrival date & time: 02/16/18  1633     History   Chief Complaint Chief Complaint  Patient presents with  . Dizziness    HPI [name] [name] is a 52 y.o. male who presents with lightheadedness. PMH significant for CAD, hx of substance abuse (crack, marijuana, alcohol). He states that he has been at [facility] for rehab for the past 2 days. He was stabilized at [facility] prior to going to [facility] on 9/4. He states he has not used anything since around 9/2 but was started on some medicines by [facility]. He was started on Abilify 5 mg po for mood control, Lexapro 5 mg po for depression & Trazodone 50 mg prn for insomnia. He started feeling lightheaded yesterday. It is worsened with going from sitting to standing. It is intermittent. He attributed it to starting the new medicines. Today the symptoms became more pronounced. His HR was noted to be 46-48 so was referred to the ED. He reports intermittent chest spasms which is a long standing issue for him. He also feels that in his abdomen at times. He endorses nausea but denies any vomiting. No fever, cough, SOB, palpitations, change in bowels, urinary symptoms. No leg swelling. Cardiac cath in 2013 showed 50% occlusion of LAD and medical management was recommended which he did not do. HR in EMR typically is from 55-70  HPI  Past Medical History:  Diagnosis Date  . Abnormal ECG    a. early repolarization  . Arthritis    "my whole left side" (05/03/2014)  . Bipolar disorder ([place])   . Bradycardia    a. asymptomatic  . CAD in native artery    a. Nonobstructive cath 11/2007;  b. Presented with ST elevation - Nonobstructive cath 08/2011  . Chest pain, mid sternal   . Coronary artery disease   . GERD (gastroesophageal reflux disease)   . History of cocaine abuse    a. quit ? 2009  . History of ETOH abuse    a. drinks 2 "40's" / wk  . Marijuana abuse    a. uses ~ 1x /wk or  less  . Pneumonia   . Stomach ulcer   . Syncope    a. 12/2010 - presumed to be vasovagal  . Tobacco abuse     Patient Active Problem List   Diagnosis Date Noted  . MDD (major depressive disorder), recurrent severe, without psychosis ([place]) 02/08/2018  . Major depressive disorder, recurrent episode, severe with anxious distress ([place]) 05/23/2016  . PTSD (post-traumatic stress disorder) 05/23/2016  . Cocaine use disorder, moderate, in early remission ([place]) 05/23/2016  . Alcohol use disorder, moderate, dependence ([place]) 05/23/2016  . Affective psychosis, bipolar ([place]) 04/30/2016  . Cocaine abuse with cocaine-induced mood disorder ([place]) 03/04/2016  . Alcohol dependence with uncomplicated withdrawal ([place])   . Alcohol use disorder, severe, dependence ([place]) 01/18/2015  . Alcohol dependence with alcohol-induced mood disorder ([place])   . Suicidal ideation   . Acute pancreatitis 03/20/2012  . Polysubstance abuse ([place]) 03/20/2012  . Cannabis abuse 01/12/2012  . Alcohol dependence ([place]) 10/15/2011  . Abnormal ECG   . Chest pain   . CAD in native artery   . GERD (gastroesophageal reflux disease)   . Tobacco abuse   . History of cocaine abuse   . History of ETOH abuse   . Bradycardia   . Syncope     Past Surgical History:  Procedure Laterality Date  .  CARDIAC CATHETERIZATION  2009; 08/2011   [name] 08/06/2011  . LEFT HEART CATHETERIZATION WITH CORONARY ANGIOGRAM N/A 08/16/2011   Procedure: LEFT HEART CATHETERIZATION WITH CORONARY ANGIOGRAM;  Surgeon: [name], MD;  Location: [facility] CATH LAB;  Service: Cardiovascular;  Laterality: N/A;        Home Medications    Prior to Admission medications   Medication Sig Start Date End Date Taking? Authorizing Provider  ARIPiprazole (ABILIFY) 5 MG tablet Take 1 tablet (5 mg total) by mouth daily. For mood control 02/15/18   Lindell Spar I, NP  escitalopram (LEXAPRO) 5 MG tablet Take 1 tablet (5 mg total) by mouth daily. For depression 02/15/18    Lindell Spar I, NP  traZODone (DESYREL) 50 MG tablet Take 1 tablet (50 mg total) by mouth at bedtime as needed for sleep. 02/14/18   Encarnacion Slates, NP    Family History History reviewed. No pertinent family history.  Social History Social History   Tobacco Use  . Smoking status: Current Some Day Smoker    Packs/day: 0.50    Years: 20.00    Pack years: 10.00    Types: Cigarettes    Last attempt to quit: 06/24/2015    Years since quitting: 2.6  . Smokeless tobacco: Never Used  Substance Use Topics  . Alcohol use: Yes    Alcohol/week: 48.0 standard drinks    Types: 48 Cans of beer per week    Comment: 2 40 oz beers per day  . Drug use: Yes    Types: Marijuana, Cocaine     Allergies   Aspirin; Pepperoni [pickled meat]; Tomato; and Tylenol [acetaminophen]   Review of Systems Review of Systems  Constitutional: Negative for fever.  Respiratory: Negative for cough.   Cardiovascular: Positive for chest pain (chronic).  Gastrointestinal: Positive for abdominal pain (chronic).  Neurological: Positive for light-headedness. Negative for syncope.  All other systems reviewed and are negative.    Physical Exam Updated Vital Signs BP 125/80 (BP Location: Right Arm)   Pulse (!) 48   Temp 98.1 F (36.7 C)   Resp 17   Ht 5\' 5"  (1.651 m)   Wt 54 kg   SpO2 99%   BMI 19.80 kg/m   Physical Exam  Constitutional: He is oriented to person, place, and time. He appears well-developed and well-nourished. No distress.  Calm and cooperative  HENT:  Head: Normocephalic and atraumatic.  Eyes: Pupils are equal, round, and reactive to light. Conjunctivae are normal. Right eye exhibits no discharge. Left eye exhibits no discharge. No scleral icterus.  Neck: Normal range of motion.  Cardiovascular: Regular rhythm. Bradycardia present.  Pulmonary/Chest: Effort normal and breath sounds normal. No respiratory distress.  Abdominal: Soft. Bowel sounds are normal. He exhibits no distension.  There is no tenderness.  Neurological: He is alert and oriented to person, place, and time.  Skin: Skin is warm and dry.  Psychiatric: He has a normal mood and affect. His behavior is normal.  Nursing note and vitals reviewed.    ED Treatments / Results  Labs (all labs ordered are listed, but only abnormal results are displayed) Labs Reviewed  CBC WITH DIFFERENTIAL/PLATELET - Abnormal; Notable for the following components:      Result Value   RBC 3.95 (*)    Hemoglobin 12.7 (*)    HCT 37.6 (*)    All other components within normal limits  BASIC METABOLIC PANEL  I-STAT TROPONIN, ED    EKG EKG Interpretation  Date/Time:  Thursday  February 16 2018 16:41:50 EDT Ventricular Rate:  48 PR Interval:    QRS Duration: 84 QT Interval:  413 QTC Calculation: 369 R Axis:   47 Text Interpretation:  Sinus bradycardia ST elev, probable normal early repol pattern No significant change since last tracing 08 Feb 2018 Confirmed by Rolland Porter [phone]) on 02/17/2018 9:22:26 PM   Radiology No results found.  Procedures Procedures (including critical care time)  Medications Ordered in ED Medications  sodium chloride 0.9 % bolus 1,000 mL (0 mLs Intravenous Stopped 02/16/18 1845)     Initial Impression / Assessment and Plan / ED Course  I have reviewed the triage vital signs and the nursing notes.  Pertinent labs & imaging results that were available during my care of the patient were reviewed by me and considered in my medical decision making (see chart for details).  Clinical Course as of Feb 17 2055  Thu Feb 16, 2018  1721 EKG is sinus bradycardia rate of 58 J-point elevation anteriorly.   [MB]  [id number] 52 year old male with known coronary artery disease here with feeling lightheaded on and off for the past month.  He is recently got himself into rehab after detoxing from crack and alcohol.  Here his blood work and troponin were unremarkable.  He looks very comfortable.  His heart rate  here is been in the 40s and 50s but pressures been good.  I think he can follow-up outpatient with cardiology and otherwise return back to detox with the understanding of coming back to the emergency department if any worsening symptoms.   [MB]    Clinical Course User Index [MB] Hayden Rasmussen, MD   52 year old male presents with lightheadedness and atypical chest pain. He does have bradycardia but otherwise vitals are normal. He is not orthostatic. Exam is unremarkable. Will obtain labs, CXR, troponin, EKG and give fluids and reassess.  CBC is remarkable for mild anemia. BMP is normal. Troponin is normal. EKG is sinus bradycardia with early repol. It appears similar to prior. He was given fluids and feels a little better. Suspect symptoms may be multifactorial from detox and starting new sedating medicines. Shared visit with Dr. Melina Copa. Advised f/u with Cardiology. He was given strict return precautions.  Final Clinical Impressions(s) / ED Diagnoses   Final diagnoses:  Lightheaded  Bradycardia    ED Discharge Orders    None       Iris Pert 02/16/18 2059    Hayden Rasmussen, MD 02/17/18 1103    Recardo Evangelist, PA-C 03/02/18 0424    Hayden Rasmussen, MD 03/06/18 1126

## 2018-02-16 NOTE — ED Notes (Signed)
Discharge instructions discussed with Pt. Pt verbalized understanding. Pt stable and ambulatory.    

## 2018-02-16 NOTE — ED Notes (Signed)
Pt given sprite 

## 2018-02-16 NOTE — ED Notes (Signed)
Pt given turkey sandwich and sprite.  

## 2018-02-16 NOTE — ED Triage Notes (Addendum)
Pt here via [facility] from [facility] rehab, pt detoxed this past week at [facility] then went to behavioral health until yesterday when he started rehab at [facility] . Pt reported having dizziness starting yesterday and an intermittent tension h/a. Pulse yesterday was 63 and today is 46, Bp 113/73, 76 CBG, 100% RA. Pt A&O 4.

## 2018-02-19 ENCOUNTER — Encounter (HOSPITAL_COMMUNITY): Payer: Self-pay | Admitting: Emergency Medicine

## 2018-02-19 ENCOUNTER — Emergency Department (HOSPITAL_COMMUNITY)
Admission: EM | Admit: 2018-02-19 | Discharge: 2018-02-19 | Disposition: A | Discharge status: Home / Self Care | Payer: Medicaid Other | Attending: Emergency Medicine | Admitting: Emergency Medicine

## 2018-02-19 DIAGNOSIS — I2510 Atherosclerotic heart disease of native coronary artery without angina pectoris: Secondary | ICD-10-CM | POA: Insufficient documentation

## 2018-02-19 DIAGNOSIS — F17210 Nicotine dependence, cigarettes, uncomplicated: Secondary | ICD-10-CM | POA: Diagnosis not present

## 2018-02-19 DIAGNOSIS — R42 Dizziness and giddiness: Secondary | ICD-10-CM | POA: Diagnosis not present

## 2018-02-19 DIAGNOSIS — R110 Nausea: Secondary | ICD-10-CM | POA: Insufficient documentation

## 2018-02-19 LAB — CBC WITH DIFFERENTIAL/PLATELET
Abs Immature Granulocytes: 0 10*3/uL (ref 0.0–0.1)
BASOS ABS: 0 10*3/uL (ref 0.0–0.1)
Basophils Relative: 1 %
EOS ABS: 0.1 10*3/uL (ref 0.0–0.7)
Eosinophils Relative: 3 %
HEMATOCRIT: 38.4 % — AB (ref 39.0–52.0)
Hemoglobin: 12.8 g/dL — ABNORMAL LOW (ref 13.0–17.0)
IMMATURE GRANULOCYTES: 0 %
Lymphocytes Relative: 42 %
Lymphs Abs: 1.9 10*3/uL (ref 0.7–4.0)
MCH: 31.4 pg (ref 26.0–34.0)
MCHC: 33.3 g/dL (ref 30.0–36.0)
MCV: 94.3 fL (ref 78.0–100.0)
MONO ABS: 0.6 10*3/uL (ref 0.1–1.0)
Monocytes Relative: 13 %
Neutro Abs: 2 10*3/uL (ref 1.7–7.7)
Neutrophils Relative %: 43 %
PLATELETS: 258 10*3/uL (ref 150–400)
RBC: 4.07 MIL/uL — AB (ref 4.22–5.81)
RDW: 12.5 % (ref 11.5–15.5)
WBC: 4.6 10*3/uL (ref 4.0–10.5)

## 2018-02-19 LAB — BASIC METABOLIC PANEL
ANION GAP: 6 (ref 5–15)
BUN: 7 mg/dL (ref 6–20)
CO2: 30 mmol/L (ref 22–32)
CREATININE: 1.09 mg/dL (ref 0.61–1.24)
Calcium: 9.3 mg/dL (ref 8.9–10.3)
Chloride: 104 mmol/L (ref 98–111)
Glucose, Bld: 85 mg/dL (ref 70–99)
Potassium: 4.1 mmol/L (ref 3.5–5.1)
SODIUM: 140 mmol/L (ref 135–145)

## 2018-02-19 LAB — I-STAT TROPONIN, ED: TROPONIN I, POC: 0 ng/mL (ref 0.00–0.08)

## 2018-02-19 MED ORDER — ONDANSETRON 4 MG PO TBDP: 4.0000 mg | ORAL_TABLET | Freq: Three times a day (TID) | ORAL | 0 refills | Status: DC | PRN

## 2018-02-19 NOTE — Discharge Instructions (Signed)
It was my pleasure taking care of you today!   Please call your doctor who manages your medications. There is a chance your symptoms could be related to side effects of your medicine. I would like you to discuss your symptoms with them.   Zofran as needed for nausea.   Return to ER for new or worsening symptoms, any additional concerns.

## 2018-02-19 NOTE — ED Triage Notes (Signed)
Pt arrives via EMS from [facility], dizziness and nausea started at 2am today. Pt was seen here the other day for the same symptoms. Denies pain. A&Ox4

## 2018-02-19 NOTE — ED Provider Notes (Signed)
[place] EMERGENCY DEPARTMENT Provider Note   CSN: [id number] Arrival date & time: 02/19/18  1607     History   Chief Complaint Chief Complaint  Patient presents with  . Dizziness  . Nausea    HPI [name] [name] is a 52 y.o. male.  The history is provided by the patient and medical records. No language interpreter was used.  Dizziness  Associated symptoms: nausea   Associated symptoms: no diarrhea and no vomiting    [name] [name] is a 52 y.o. male  with a PMH of CAD, bradycardia, substance abuse who presents to the Emergency Department from [facility] complaining of lightheadedness for the last 3 to 4 days.  He states that this will occur both while sitting and when he gets up.  It is intermittent, occurring about 3-4 times a day.  He was seen in the emergency department on 9/12 for the same.  He states that symptoms have not worsened or occurred more frequently, just have not gone away and this is worrisome for him.  He notes associated nausea, but denies any vomiting.  He has been at [facility] since 9/04.  He has recently started new medications (Abilify 5 mg po for mood control, Lexapro 5 mg po for depression & Trazodone 50 mg prn for insomnia).  Denies any room spinning dizziness sensation.  No headache, chest pain, shortness of breath.  No syncopal episodes.  No numbness, tingling.  No difficulty with gait or speech.   Past Medical History:  Diagnosis Date  . Abnormal ECG    a. early repolarization  . Arthritis    "my whole left side" (05/03/2014)  . Bipolar disorder ([place])   . Bradycardia    a. asymptomatic  . CAD in native artery    a. Nonobstructive cath 11/2007;  b. Presented with ST elevation - Nonobstructive cath 08/2011  . Chest pain, mid sternal   . Coronary artery disease   . GERD (gastroesophageal reflux disease)   . History of cocaine abuse    a. quit ? 2009  . History of ETOH abuse    a. drinks 2 "40's" / wk  . Marijuana abuse    a.  uses ~ 1x /wk or less  . Pneumonia   . Stomach ulcer   . Syncope    a. 12/2010 - presumed to be vasovagal  . Tobacco abuse     Patient Active Problem List   Diagnosis Date Noted  . MDD (major depressive disorder), recurrent severe, without psychosis ([place]) 02/08/2018  . Major depressive disorder, recurrent episode, severe with anxious distress ([place]) 05/23/2016  . PTSD (post-traumatic stress disorder) 05/23/2016  . Cocaine use disorder, moderate, in early remission ([place]) 05/23/2016  . Alcohol use disorder, moderate, dependence ([place]) 05/23/2016  . Affective psychosis, bipolar ([place]) 04/30/2016  . Cocaine abuse with cocaine-induced mood disorder ([place]) 03/04/2016  . Alcohol dependence with uncomplicated withdrawal ([place])   . Alcohol use disorder, severe, dependence ([place]) 01/18/2015  . Alcohol dependence with alcohol-induced mood disorder ([place])   . Suicidal ideation   . Acute pancreatitis 03/20/2012  . Polysubstance abuse ([place]) 03/20/2012  . Cannabis abuse 01/12/2012  . Alcohol dependence ([place]) 10/15/2011  . Abnormal ECG   . Chest pain   . CAD in native artery   . GERD (gastroesophageal reflux disease)   . Tobacco abuse   . History of cocaine abuse   . History of ETOH abuse   . Bradycardia   . Syncope  Past Surgical History:  Procedure Laterality Date  . CARDIAC CATHETERIZATION  2009; 08/2011   [name] 08/06/2011  . LEFT HEART CATHETERIZATION WITH CORONARY ANGIOGRAM N/A 08/16/2011   Procedure: LEFT HEART CATHETERIZATION WITH CORONARY ANGIOGRAM;  Surgeon: [name], MD;  Location: [facility] CATH LAB;  Service: Cardiovascular;  Laterality: N/A;        Home Medications    Prior to Admission medications   Medication Sig Start Date End Date Taking? Authorizing Provider  ARIPiprazole (ABILIFY) 5 MG tablet Take 1 tablet (5 mg total) by mouth daily. For mood control 02/15/18  Yes Nwoko, Herbert Pun I, NP  escitalopram (LEXAPRO) 5 MG tablet Take 1 tablet (5 mg total) by mouth daily. For  depression 02/15/18  Yes Lindell Spar I, NP  traZODone (DESYREL) 50 MG tablet Take 1 tablet (50 mg total) by mouth at bedtime as needed for sleep. 02/14/18  Yes Lindell Spar I, NP  ondansetron (ZOFRAN ODT) 4 MG disintegrating tablet Take 1 tablet (4 mg total) by mouth every 8 (eight) hours as needed for nausea or vomiting. 02/19/18   Shraddha Lebron, Ozella Almond, PA-C    Family History No family history on file.  Social History Social History   Tobacco Use  . Smoking status: Current Some Day Smoker    Packs/day: 0.50    Years: 20.00    Pack years: 10.00    Types: Cigarettes    Last attempt to quit: 06/24/2015    Years since quitting: 2.6  . Smokeless tobacco: Never Used  Substance Use Topics  . Alcohol use: Yes    Alcohol/week: 48.0 standard drinks    Types: 48 Cans of beer per week    Comment: 2 40 oz beers per day  . Drug use: Yes    Types: Marijuana, Cocaine     Allergies   Aspirin; Pepperoni [pickled meat]; Tomato; and Tylenol [acetaminophen]   Review of Systems Review of Systems  Gastrointestinal: Positive for nausea. Negative for abdominal pain, diarrhea and vomiting.  Neurological: Positive for light-headedness.  All other systems reviewed and are negative.    Physical Exam Updated Vital Signs BP 108/74   Pulse (!) 52   Temp 98.2 F (36.8 C) (Oral)   Resp 15   SpO2 99%   Physical Exam  Constitutional: He is oriented to person, place, and time. He appears well-developed and well-nourished. No distress.  HENT:  Head: Normocephalic and atraumatic.  Cardiovascular: Normal heart sounds.  No murmur heard. Bradycardic, but regular.  Pulmonary/Chest: Effort normal and breath sounds normal. No respiratory distress.  Abdominal: Soft. He exhibits no distension. There is no tenderness.  Musculoskeletal: Normal range of motion. He exhibits no edema.  Neurological: He is alert and oriented to person, place, and time.  Skin: Skin is warm and dry.  Nursing note and vitals  reviewed.    ED Treatments / Results  Labs (all labs ordered are listed, but only abnormal results are displayed) Labs Reviewed  CBC WITH DIFFERENTIAL/PLATELET - Abnormal; Notable for the following components:      Result Value   RBC 4.07 (*)    Hemoglobin 12.8 (*)    HCT 38.4 (*)    All other components within normal limits  BASIC METABOLIC PANEL  I-STAT TROPONIN, ED    EKG EKG Interpretation  Date/Time:  Sunday February 19 2018 16:17:00 EDT Ventricular Rate:  50 PR Interval:    QRS Duration: 90 QT Interval:  406 QTC Calculation: 371 R Axis:   49 Text Interpretation:  Sinus rhythm RSR' in V1 or V2, probably normal variant ST elev, probable normal early repol pattern No STEMI.  Confirmed by Nanda Quinton [phone]) on 02/19/2018 4:31:23 PM   Radiology No results found.  Procedures Procedures (including critical care time)  Medications Ordered in ED Medications - No data to display   Initial Impression / Assessment and Plan / ED Course  I have reviewed the triage vital signs and the nursing notes.  Pertinent labs & imaging results that were available during my care of the patient were reviewed by me and considered in my medical decision making (see chart for details).    [name] [name] is a 52 y.o. male who presents to ED for nausea and lightheadedness over the last 3 to 4 days.  Patient seen in the emergency department for similar on 9/12 and chart reviewed from this encounter.  Other than bradycardia, work-up was unremarkable at that time.  Patient was advised to follow-up with cardiology, however he has been demarcation and has been the weekend, therefore he has not done so yet.  He states that he does plan to follow-up with his cardiologist.  EKG today is reassuring.  He does have bradycardia, but per chart review, this appears to be baseline for him and not an acute issue. Labs including troponin reassuring as well.  He was recently admitted to behavioral health on 9/04  and started on 3 new medications including Lexapro, trazodone and Abilify.  He states that he has not had any symptoms until starting this medicines. ? If his symptoms are related to medication side effect. I attempted to discuss with psychiatry, however despite multiple attempts by myself and secretary over two hours was never able to reach a provider. Patient will call to follow up and ask about medications tomorrow morning. Evaluation does not show pathology that would require ongoing emergent intervention or inpatient treatment. Reasons to return to ER discussed. All questions answered.   Patient discussed with Dr. Laverta Baltimore who agrees with treatment plan.    Final Clinical Impressions(s) / ED Diagnoses   Final diagnoses:  Nausea  Lightheadedness    ED Discharge Orders         Ordered    ondansetron (ZOFRAN ODT) 4 MG disintegrating tablet  Every 8 hours PRN     02/19/18 1901           Briann Sarchet, Ozella Almond, PA-C 02/19/18 1921    Margette Fast, MD 02/20/18 1020

## 2018-02-19 NOTE — ED Notes (Signed)
Discharge paperwork reviewed and all questions answered. [facility] called and stated they would pick pt up in lobby.

## 2018-04-29 ENCOUNTER — Emergency Department (HOSPITAL_COMMUNITY)
Admission: EM | Admit: 2018-04-29 | Discharge: 2018-04-30 | Disposition: A | Discharge status: Home / Self Care | Payer: Medicaid Other | Attending: Emergency Medicine | Admitting: Emergency Medicine

## 2018-04-29 ENCOUNTER — Encounter (HOSPITAL_COMMUNITY): Payer: Self-pay | Admitting: Oncology

## 2018-04-29 ENCOUNTER — Emergency Department (HOSPITAL_COMMUNITY): Payer: Medicaid Other

## 2018-04-29 DIAGNOSIS — R079 Chest pain, unspecified: Secondary | ICD-10-CM | POA: Diagnosis present

## 2018-04-29 DIAGNOSIS — F17210 Nicotine dependence, cigarettes, uncomplicated: Secondary | ICD-10-CM | POA: Diagnosis not present

## 2018-04-29 DIAGNOSIS — I2510 Atherosclerotic heart disease of native coronary artery without angina pectoris: Secondary | ICD-10-CM | POA: Diagnosis not present

## 2018-04-29 LAB — BASIC METABOLIC PANEL
Anion gap: 11 (ref 5–15)
BUN: 8 mg/dL (ref 6–20)
CHLORIDE: 106 mmol/L (ref 98–111)
CO2: 21 mmol/L — ABNORMAL LOW (ref 22–32)
Calcium: 8.4 mg/dL — ABNORMAL LOW (ref 8.9–10.3)
Creatinine, Ser: 1.11 mg/dL (ref 0.61–1.24)
GFR calc Af Amer: >60 mL/min (ref >60)
GFR calc non Af Amer: >60 mL/min (ref >60)
GLUCOSE: 84 mg/dL (ref 70–99)
POTASSIUM: 3.5 mmol/L (ref 3.5–5.1)
SODIUM: 138 mmol/L (ref 135–145)

## 2018-04-29 LAB — CBC
HEMATOCRIT: 42.5 % (ref 39.0–52.0)
Hemoglobin: 14.4 g/dL (ref 13.0–17.0)
MCH: 31 pg (ref 26.0–34.0)
MCHC: 33.9 g/dL (ref 30.0–36.0)
MCV: 91.4 fL (ref 80.0–100.0)
Platelets: 238 10*3/uL (ref 150–400)
RBC: 4.65 MIL/uL (ref 4.22–5.81)
RDW: 13.2 % (ref 11.5–15.5)
WBC: 6.6 10*3/uL (ref 4.0–10.5)
nRBC: 0 % (ref 0.0–0.2)

## 2018-04-29 LAB — I-STAT TROPONIN, ED: TROPONIN I, POC: 0 ng/mL (ref 0.00–0.08)

## 2018-04-29 LAB — LIPASE, BLOOD: Lipase: 39 U/L (ref 11–51)

## 2018-04-29 MED ORDER — LIDOCAINE VISCOUS HCL 2 % MT SOLN
15.0000 mL | Freq: Once | OROMUCOSAL | Status: AC
  Administered 2018-04-29: 15 mL via ORAL
  Filled 2018-04-29: qty 15

## 2018-04-29 MED ORDER — ALUM & MAG HYDROXIDE-SIMETH 200-200-20 MG/5ML PO SUSP
30.0000 mL | Freq: Once | ORAL | Status: AC
  Administered 2018-04-29: 30 mL via ORAL
  Filled 2018-04-29: qty 30

## 2018-04-29 NOTE — ED Triage Notes (Signed)
Pt bib [facility] d/t CP x 2 days.  Pt reports hx of MI, ulcer and allergy to asa.  Pt given 1 nitro en route.  Pt reports ETOH, THC and possible crack cocaine in the [facility].

## 2018-04-29 NOTE — ED Provider Notes (Signed)
[place] [facility] EMERGENCY DEPARTMENT Provider Note   CSN: [id number] Arrival date & time: 04/29/18  2238     History   Chief Complaint Chief Complaint  Patient presents with  . Chest Pain    HPI [name] [name] is a 52 y.o. male.  52 year old male with a history of CAD, esophageal reflux, polysubstance abuse including cocaine use, bipolar disorder presents to the emergency department for complaints of chest pain.  He states that he has had intermittent, fleeting sharp pains lasting a few seconds.  Pain is present in the left side of his chest.  He has had some lightheadedness with this chest pain, but denies shortness of breath, diaphoresis, nausea, vomiting, leg swelling, syncope.  Is unable to specify any modifying factors of his chest pain.  Denies any worsening with exertion or eating.  Did drink 3 bottles of 40 ounce beer today.  Usually drinks regularly.  Smokes some marijuana which he believes was laced with crack cocaine earlier today as well.  The history is provided by the patient. No language interpreter was used.    Past Medical History:  Diagnosis Date  . Abnormal ECG    a. early repolarization  . Arthritis    "my whole left side" (05/03/2014)  . Bipolar disorder ([place])   . Bradycardia    a. asymptomatic  . CAD in native artery    a. Nonobstructive cath 11/2007;  b. Presented with ST elevation - Nonobstructive cath 08/2011  . Chest pain, mid sternal   . Coronary artery disease   . GERD (gastroesophageal reflux disease)   . History of cocaine abuse ([place])    a. quit ? 2009  . History of ETOH abuse    a. drinks 2 "40's" / wk  . Marijuana abuse    a. uses ~ 1x /wk or less  . Pneumonia   . Stomach ulcer   . Syncope    a. 12/2010 - presumed to be vasovagal  . Tobacco abuse     Patient Active Problem List   Diagnosis Date Noted  . MDD (major depressive disorder), recurrent severe, without psychosis ([place]) 02/08/2018  . Major depressive disorder,  recurrent episode, severe with anxious distress ([place]) 05/23/2016  . PTSD (post-traumatic stress disorder) 05/23/2016  . Cocaine use disorder, moderate, in early remission ([place]) 05/23/2016  . Alcohol use disorder, moderate, dependence ([place]) 05/23/2016  . Affective psychosis, bipolar ([employer]) 04/30/2016  . Cocaine abuse with cocaine-induced mood disorder ([place]) 03/04/2016  . Alcohol dependence with uncomplicated withdrawal ([place])   . Alcohol use disorder, severe, dependence ([place]) 01/18/2015  . Alcohol dependence with alcohol-induced mood disorder ([place])   . Suicidal ideation   . Acute pancreatitis 03/20/2012  . Polysubstance abuse ([place]) 03/20/2012  . Cannabis abuse 01/12/2012  . Alcohol dependence ([place]) 10/15/2011  . Abnormal ECG   . Chest pain   . CAD in native artery   . GERD (gastroesophageal reflux disease)   . Tobacco abuse   . History of cocaine abuse ([place])   . History of ETOH abuse   . Bradycardia   . Syncope     Past Surgical History:  Procedure Laterality Date  . CARDIAC CATHETERIZATION  2009; 08/2011   [name] 08/06/2011  . LEFT HEART CATHETERIZATION WITH CORONARY ANGIOGRAM N/A 08/16/2011   Procedure: LEFT HEART CATHETERIZATION WITH CORONARY ANGIOGRAM;  Surgeon: [name], MD;  [facility];  Service: Cardiovascular;  Laterality: N/A;        Home Medications  Prior to Admission medications   Medication Sig Start Date End Date Taking? Authorizing Provider  ARIPiprazole (ABILIFY) 5 MG tablet Take 1 tablet (5 mg total) by mouth daily. For mood control Patient not taking: Reported on 04/30/2018 02/15/18   Lindell Spar I, NP  escitalopram (LEXAPRO) 5 MG tablet Take 1 tablet (5 mg total) by mouth daily. For depression Patient not taking: Reported on 04/30/2018 02/15/18   Lindell Spar I, NP  ondansetron (ZOFRAN ODT) 4 MG disintegrating tablet Take 1 tablet (4 mg total) by mouth every 8 (eight) hours as needed for nausea or vomiting. Patient not taking: Reported  on 04/30/2018 02/19/18   Ward, Ozella Almond, PA-C  pantoprazole (PROTONIX) 20 MG tablet Take 1 tablet (20 mg total) by mouth daily. 04/30/18   Antonietta Breach, PA-C  traZODone (DESYREL) 50 MG tablet Take 1 tablet (50 mg total) by mouth at bedtime as needed for sleep. Patient not taking: Reported on 04/30/2018 02/14/18   Encarnacion Slates, NP    Family History History reviewed. No pertinent family history.  Social History Social History   Tobacco Use  . Smoking status: Current Some Day Smoker    Packs/day: 0.50    Years: 20.00    Pack years: 10.00    Types: Cigarettes    Last attempt to quit: 06/24/2015    Years since quitting: 2.8  . Smokeless tobacco: Never Used  Substance Use Topics  . Alcohol use: Yes    Alcohol/week: 48.0 standard drinks    Types: 48 Cans of beer per week    Comment: 2 40 oz beers per day  . Drug use: Yes    Types: Marijuana, Cocaine     Allergies   Aspirin; Pepperoni [pickled meat]; Tomato; and Tylenol [acetaminophen]   Review of Systems Review of Systems Ten systems reviewed and are negative for acute change, except as noted in the HPI.    Physical Exam Updated Vital Signs BP 98/63 (BP Location: Right Arm)   Pulse 73   Temp 97.7 F (36.5 C) (Oral)   Resp 15   Ht 5\' 5"  (1.651 m)   Wt 52.2 kg   SpO2 95%   BMI 19.14 kg/m   Physical Exam  Constitutional: He is oriented to person, place, and time. He appears well-developed and well-nourished. No distress.  Nontoxic appearing and in NAD  HENT:  Head: Normocephalic and atraumatic.  Eyes: Conjunctivae and EOM are normal. No scleral icterus.  Neck: Normal range of motion.  Cardiovascular: Normal rate, regular rhythm and intact distal pulses.  Pulmonary/Chest: Effort normal. No stridor. No respiratory distress. He has no wheezes.  Respirations even and unlabored  Musculoskeletal: Normal range of motion.  No BLE edema  Neurological: He is alert and oriented to person, place, and time. He exhibits  normal muscle tone. Coordination normal.  Skin: Skin is warm and dry. No rash noted. He is not diaphoretic. No erythema. No pallor.  Psychiatric: He has a normal mood and affect. His behavior is normal.  Nursing note and vitals reviewed.    ED Treatments / Results  Labs (all labs ordered are listed, but only abnormal results are displayed) Labs Reviewed  BASIC METABOLIC PANEL - Abnormal; Notable for the following components:      Result Value   CO2 21 (*)    Calcium 8.4 (*)    All other components within normal limits  CBC  LIPASE, BLOOD  I-STAT TROPONIN, ED  I-STAT TROPONIN, ED    EKG EKG Interpretation  Date/Time:  Saturday April 29 2018 22:41:00 EST Ventricular Rate:  68 PR Interval:    QRS Duration: 87 QT Interval:  369 QTC Calculation: 393 R Axis:   52 Text Interpretation:  Sinus arrhythmia ST elev, probable normal early repol pattern No acute changes No significant change since last tracing Nonspecific ST and T wave abnormality Confirmed by Varney Biles [phone]) on 04/29/2018 10:52:46 PM   Radiology Dg Chest 2 View  Result Date: 04/30/2018 CLINICAL DATA:  Chest pain EXAM: CHEST - 2 VIEW COMPARISON:  02/16/2018 FINDINGS: The heart size and mediastinal contours are within normal limits. Both lungs are clear. The visualized skeletal structures are unremarkable. IMPRESSION: No active cardiopulmonary disease. Electronically Signed   By: Ulyses Jarred M.D.   On: 04/30/2018 00:07    Procedures Procedures (including critical care time)   PROCEDURE:  Left heart catheterization with selective coronary angiography, left ventriculogram - 08/16/11  PRIMARY CARDIOLOGIST:  Dr. Sung Amabile  INDICATIONS:  Possible MI, abnormal ECG  The patient was brought emergently to the cath lab.  Implied consent due to possible inferior STEMI.  PROCEDURE TECHNIQUE:  After Xylocaine anesthesia a 89F sheath was placed in the right femoral artery with a single anterior needle wall  stick.   Left coronary angiography was done using a Judkins L4 guide catheter.  Right coronary angiography was done using a Judkins R4 guide catheter.  Left ventriculography was done using a pigtail catheter.  Manual compression for hemostasis.   CONTRAST:  Total of 65 cc.  COMPLICATIONS:  None.    HEMODYNAMICS:  Aortic pressure was 83/58; LV pressure was 84/2; LVEDP 9.  There was no gradient between the left ventricle and aorta.    ANGIOGRAPHIC DATA:   The left main coronary artery is widely patent.  The left anterior descending artery is a large vessel which wraps around the apex.  There is a tubular 40-50% stenosis in the mid vessel.  The left circumflex artery is small, but patent.  Large ramus vessel with no significant atherosclerosis.  The right coronary artery is a medium sized vessel.  The PDA and PLA are small but patent.Marland Kitchen  LEFT VENTRICULOGRAM:  Left ventricular angiogram was done in the 30 RAO projection and revealed normal left ventricular wall motion and systolic function with an estimated ejection fraction of 55%.  LVEDP was 9 mmHg.  IMPRESSIONS:  1. Widely patent left main coronary artery. 2. Mild atherosclerosis in the mid left anterior descending artery. 3. No significant disease in the circumflex artery and its branches, ramus or RCA. 4. Normal left ventricular systolic function.  LVEDP 9 mmHg.  Ejection fraction 55%.   Medications Ordered in ED Medications  alum & mag hydroxide-simeth (MAALOX/MYLANTA) 200-200-20 MG/5ML suspension 30 mL (30 mLs Oral Given 04/29/18 2317)    And  lidocaine (XYLOCAINE) 2 % viscous mouth solution 15 mL (15 mLs Oral Given 04/29/18 2317)    1:00 AM Patient reassessed.  Feels the pain has improved following a GI cocktail.  Understands need to wait for delta troponin.  No complaints at this time.  3:15 AM Delta troponin reviewed.  This is also negative.  Patient has remained hemodynamically stable.   Initial Impression /  Assessment and Plan / ED Course  I have reviewed the triage vital signs and the nursing notes.  Pertinent labs & imaging results that were available during my care of the patient were reviewed by me and considered in my medical decision making (see chart for details).  Patient presents to the emergency department for evaluation of chest pain.  Low suspicion for cardiac etiology given reassuring workup today.  EKG is nonischemic and troponin negative x 2.  Patient has a heart score of 3 consistent with low risk of acute coronary event.  Chest x-ray without evidence of mediastinal widening to suggest dissection.  No pneumothorax, pneumonia, pleural effusion.  Pulmonary embolus further considered; however, patient without tachycardia, tachypnea, dyspnea, hypoxia.  Well's PE score without high risk concern for PE.  The patient is a known heavy drinker.  Reports history of an ulcer.  He had improvement in his pain following a GI cocktail and has been resting comfortably ever since without recurrence of his discomfort.  Question whether pain may be secondary to PUD/gastritis.  Will start on course of Protonix and have encouraged outpatient cardiology follow-up.  Counseled on the need to discontinue use of illicit substances.  Return precautions discussed and provided. Patient discharged in stable condition with no unaddressed concerns.   Final Clinical Impressions(s) / ED Diagnoses   Final diagnoses:  Nonspecific chest pain    ED Discharge Orders         Ordered    pantoprazole (PROTONIX) 20 MG tablet  Daily     04/30/18 0340           Antonietta Breach, PA-C 04/30/18 0617    Ward, Delice Bison, DO 04/30/18 [phone]

## 2018-04-30 LAB — I-STAT TROPONIN, ED: TROPONIN I, POC: 0 ng/mL (ref 0.00–0.08)

## 2018-04-30 MED ORDER — PANTOPRAZOLE SODIUM 20 MG PO TBEC: 20.0000 mg | DELAYED_RELEASE_TABLET | Freq: Every day | ORAL | 1 refills | Status: DC

## 2018-04-30 NOTE — Discharge Instructions (Signed)
Your work-up in the emergency department today was reassuring.  We advise close follow-up with your primary care doctor as well as a cardiologist.  Call the office of [facility] in the morning to schedule a follow-up visit.  Avoid excessive use of alcohol as well as any crack cocaine or other illicit drug use.  It is likely that these may be contributing to your symptoms.  Given the improvement in your pain with a GI cocktail, you have been given a prescription for Protonix.  Take as prescribed daily.  You may return to the ED for new or concerning symptoms.

## 2018-04-30 NOTE — ED Notes (Signed)
Pt aware of need for urine specimen. 

## 2018-05-11 ENCOUNTER — Encounter (HOSPITAL_COMMUNITY): Payer: Self-pay | Admitting: *Deleted

## 2018-05-11 ENCOUNTER — Other Ambulatory Visit: Payer: Self-pay

## 2018-05-11 ENCOUNTER — Emergency Department (HOSPITAL_COMMUNITY)
Admission: EM | Admit: 2018-05-11 | Discharge: 2018-05-12 | Disposition: A | Discharge status: Rehabilitation Facility | Payer: Medicaid Other | Attending: Emergency Medicine | Admitting: Emergency Medicine

## 2018-05-11 DIAGNOSIS — R45851 Suicidal ideations: Secondary | ICD-10-CM | POA: Diagnosis not present

## 2018-05-11 DIAGNOSIS — I2510 Atherosclerotic heart disease of native coronary artery without angina pectoris: Secondary | ICD-10-CM | POA: Diagnosis not present

## 2018-05-11 DIAGNOSIS — F17210 Nicotine dependence, cigarettes, uncomplicated: Secondary | ICD-10-CM | POA: Diagnosis not present

## 2018-05-11 DIAGNOSIS — Z79899 Other long term (current) drug therapy: Secondary | ICD-10-CM | POA: Insufficient documentation

## 2018-05-11 DIAGNOSIS — F1220 Cannabis dependence, uncomplicated: Secondary | ICD-10-CM | POA: Diagnosis not present

## 2018-05-11 DIAGNOSIS — F1020 Alcohol dependence, uncomplicated: Secondary | ICD-10-CM | POA: Insufficient documentation

## 2018-05-11 DIAGNOSIS — F1910 Other psychoactive substance abuse, uncomplicated: Secondary | ICD-10-CM | POA: Diagnosis not present

## 2018-05-11 DIAGNOSIS — F332 Major depressive disorder, recurrent severe without psychotic features: Secondary | ICD-10-CM | POA: Diagnosis not present

## 2018-05-11 DIAGNOSIS — R079 Chest pain, unspecified: Secondary | ICD-10-CM | POA: Diagnosis present

## 2018-05-11 NOTE — ED Triage Notes (Signed)
Pt stated "I called 911, [employer] came & got me from [facility] on [address].  I had 3 40's and  2 blunts. I didn't do any crack cocaine.  I live with mother & daddy @ [place]."

## 2018-05-12 LAB — ETHANOL: ALCOHOL ETHYL (B): 196 mg/dL — AB (ref <10)

## 2018-05-12 LAB — COMPREHENSIVE METABOLIC PANEL
ALT: 20 U/L (ref 0–44)
AST: 29 U/L (ref 15–41)
Albumin: 4.2 g/dL (ref 3.5–5.0)
Alkaline Phosphatase: 64 U/L (ref 38–126)
Anion gap: 11 (ref 5–15)
BUN: 11 mg/dL (ref 6–20)
CHLORIDE: 107 mmol/L (ref 98–111)
CO2: 23 mmol/L (ref 22–32)
Calcium: 8.9 mg/dL (ref 8.9–10.3)
Creatinine, Ser: 1.12 mg/dL (ref 0.61–1.24)
GFR calc Af Amer: >60 mL/min (ref >60)
GFR calc non Af Amer: >60 mL/min (ref >60)
Glucose, Bld: 122 mg/dL — ABNORMAL HIGH (ref 70–99)
POTASSIUM: 3.5 mmol/L (ref 3.5–5.1)
Sodium: 141 mmol/L (ref 135–145)
TOTAL PROTEIN: 7.5 g/dL (ref 6.5–8.1)
Total Bilirubin: 0.5 mg/dL (ref 0.3–1.2)

## 2018-05-12 LAB — CBC
HCT: 40.9 % (ref 39.0–52.0)
HEMOGLOBIN: 13.9 g/dL (ref 13.0–17.0)
MCH: 32 pg (ref 26.0–34.0)
MCHC: 34 g/dL (ref 30.0–36.0)
MCV: 94 fL (ref 80.0–100.0)
NRBC: 0 % (ref 0.0–0.2)
PLATELETS: 221 10*3/uL (ref 150–400)
RBC: 4.35 MIL/uL (ref 4.22–5.81)
RDW: 13.3 % (ref 11.5–15.5)
WBC: 5.9 10*3/uL (ref 4.0–10.5)

## 2018-05-12 LAB — I-STAT TROPONIN, ED: Troponin i, poc: 0 ng/mL (ref 0.00–0.08)

## 2018-05-12 LAB — RAPID URINE DRUG SCREEN, HOSP PERFORMED
AMPHETAMINES: NOT DETECTED
BENZODIAZEPINES: NOT DETECTED
Barbiturates: NOT DETECTED
COCAINE: NOT DETECTED
OPIATES: NOT DETECTED
Tetrahydrocannabinol: POSITIVE — AB

## 2018-05-12 MED ORDER — LORAZEPAM 1 MG PO TABS: 0.0000 mg | ORAL_TABLET | Freq: Two times a day (BID) | ORAL | Status: DC

## 2018-05-12 MED ORDER — LORAZEPAM 2 MG/ML IJ SOLN: 0.0000 mg | Freq: Four times a day (QID) | INTRAMUSCULAR | Status: DC

## 2018-05-12 MED ORDER — THIAMINE HCL 100 MG/ML IJ SOLN: 100.0000 mg | Freq: Every day | INTRAMUSCULAR | Status: DC

## 2018-05-12 MED ORDER — ESCITALOPRAM OXALATE 10 MG PO TABS: 10.0000 mg | ORAL_TABLET | Freq: Every day | ORAL | Status: DC

## 2018-05-12 MED ORDER — ONDANSETRON 4 MG PO TBDP
4.0000 mg | ORAL_TABLET | Freq: Three times a day (TID) | ORAL | Status: DC | PRN
  Administered 2018-05-12: 4 mg via ORAL
  Filled 2018-05-12: qty 1

## 2018-05-12 MED ORDER — LORAZEPAM 2 MG/ML IJ SOLN: 0.0000 mg | Freq: Two times a day (BID) | INTRAMUSCULAR | Status: DC

## 2018-05-12 MED ORDER — TRAZODONE HCL 50 MG PO TABS: 50.0000 mg | ORAL_TABLET | Freq: Every evening | ORAL | Status: DC | PRN

## 2018-05-12 MED ORDER — ARIPIPRAZOLE 5 MG PO TABS
5.0000 mg | ORAL_TABLET | Freq: Every day | ORAL | Status: DC
  Administered 2018-05-12: 5 mg via ORAL
  Filled 2018-05-12: qty 1

## 2018-05-12 MED ORDER — ESCITALOPRAM OXALATE 10 MG PO TABS
5.0000 mg | ORAL_TABLET | Freq: Every day | ORAL | Status: DC
  Administered 2018-05-12: 5 mg via ORAL
  Filled 2018-05-12: qty 1

## 2018-05-12 MED ORDER — VITAMIN B-1 100 MG PO TABS
100.0000 mg | ORAL_TABLET | Freq: Every day | ORAL | Status: DC
  Administered 2018-05-12: 100 mg via ORAL
  Filled 2018-05-12: qty 1

## 2018-05-12 MED ORDER — ALUM & MAG HYDROXIDE-SIMETH 200-200-20 MG/5ML PO SUSP
30.0000 mL | Freq: Once | ORAL | Status: AC
  Administered 2018-05-12: 30 mL via ORAL
  Filled 2018-05-12: qty 30

## 2018-05-12 MED ORDER — ESCITALOPRAM OXALATE 10 MG PO TABS
5.0000 mg | ORAL_TABLET | Freq: Once | ORAL | Status: AC
  Administered 2018-05-12: 5 mg via ORAL
  Filled 2018-05-12: qty 1

## 2018-05-12 MED ORDER — PANTOPRAZOLE SODIUM 20 MG PO TBEC
20.0000 mg | DELAYED_RELEASE_TABLET | Freq: Every day | ORAL | Status: DC
  Administered 2018-05-12: 20 mg via ORAL
  Filled 2018-05-12: qty 1

## 2018-05-12 MED ORDER — LORAZEPAM 1 MG PO TABS: 0.0000 mg | ORAL_TABLET | Freq: Four times a day (QID) | ORAL | Status: DC

## 2018-05-12 NOTE — Patient Outreach (Signed)
ED Peer Support Specialist Patient Intake (Complete at intake & 30-60 Day Follow-up)  Name: [name] [name]  MRN: [id number]  Age: 52 y.o.   Date of Admission: 05/12/2018  Intake: Initial Comments:      Primary Reason Admitted: poly substance with alcohol and marijuana, alcohol wtihdrawals  Lab values: Alcohol/ETOH: Positive Positive UDS? Yes Amphetamines: No Barbiturates: No Benzodiazepines: No Cocaine: No Opiates: No Cannabinoids: Yes  Demographic information: Gender: Male Ethnicity: African American Marital Status: Single Insurance Status: Medicaid Ecologist (Work Neurosurgeon, food stamps, etc.: Yes(disability, SSI) Lives with: Other (comment)(parent mom and dad) Living situation: House/Apartment  Reported Patient History: Patient reported health conditions: Depression, Bipolar disorder, Anxiety disorders, Trauma Patient aware of HIV and hepatitis status: No  In past year, has patient visited ED for any reason? Yes  Number of ED visits: 2  Reason(s) for visit: depression, poly substance use with alcohol and marijuana  In past year, has patient been hospitalized for any reason? Yes  Number of hospitalizations: 1  Reason(s) for hospitalization: SI, depression, poly substance use with alcohol, cocaine, and marijuana  In past year, has patient been arrested? No  Number of arrests:    Reason(s) for arrest:    In past year, has patient been incarcerated? No  Number of incarcerations:    Reason(s) for incarceration:    In past year, has patient received medication-assisted treatment? No  In past year, patient received the following treatments: Residential treatment (non-hospital)([facility] for 28 days)  In past year, has patient received any harm reduction services? No  Did this include any of the following?    In past year, has patient received care from a mental health provider for diagnosis other than SUD? No  In past year, is  this first time patient has overdosed? (has not overdosed)  Number of past overdoses:    In past year, is this first time patient has been hospitalized for an overdose? (has not overdosed)  Number of hospitalizations for overdose(s):    Is patient currently receiving treatment for a mental health diagnosis? No  Patient reports experiencing difficulty participating in SUD treatment: No    Most important reason(s) for this difficulty?    Has patient received prior services for treatment? Yes  In past, patient has received services from following agencies:    Plan of Care:  Suggested follow up at these agencies/treatment centers: (Patient is interested in detox referral for his alcohol use. [facility]PSS will work on those referrals. )  Other information: CPSS met with the patient to provide substance use recovery support and help with recovery resources. CPSS will work on referrals to [facility], [facility], and [facility]. CPSS will provide the patient with [facility] contact information as well along with information for other substance use treatment centers and other substance use recovery resources.    Henry Utsey, CPSS  05/12/2018 12:09 PM

## 2018-05-12 NOTE — Discharge Instructions (Addendum)
For your mental health needs, you are advised to continue treatment with [facility]: ° °     [facility] °     [address] °     [place], [place] [address] °     [phone] °

## 2018-05-12 NOTE — ED Notes (Signed)
TTS assessment in progress. 

## 2018-05-12 NOTE — ED Notes (Signed)
Report given to [facility] (Nurse - Bev was receiving nurse).  [facility] transport also arranged.

## 2018-05-12 NOTE — BHH Suicide Risk Assessment (Cosign Needed)
Suicide Risk Assessment  Discharge Assessment   [facility] Discharge Suicide Risk Assessment   Principal Problem: MDD (major depressive disorder), recurrent severe, without psychosis ([place]) Discharge Diagnoses: Principal Problem:   MDD (major depressive disorder), recurrent severe, without psychosis ([place])   Total Time spent with patient: 30 minutes Pt was seen and chart reviewed with treatment team and Dr Mariea Clonts. Pt denies suicidal/homicidal ideation, denies auditory/visual hallucinations and does not appear to be responding to internal stimuli. Pt stated he wants to stop drinking and wants to go to long term substance abuse rehabilitation. Pt will be referred to Peer Support for assistance with this and also be referred to [facility] for medication management, if needed, and therapy.   Musculoskeletal: Strength & Muscle Tone: within normal limits Gait & Station: normal Patient leans: N/A  Psychiatric Specialty Exam:   Blood pressure 111/73, pulse 61, temperature 98.1 F (36.7 C), temperature source Oral, resp. rate 16, height 5\' 5"  (1.651 m), weight 52.2 kg, SpO2 96 %.Body mass index is 19.14 kg/m.  General Appearance: Casual  Eye Contact::  Fair  Speech:  Clear and Coherent and Slow409  Volume:  Decreased  Mood:  Anxious and Depressed  Affect:  Congruent and Depressed  Thought Process:  Coherent, Goal Directed, Linear and Descriptions of Associations: Intact  Orientation:  Full (Time, Place, and Person)  Thought Content:  Logical  Suicidal Thoughts:  No  Homicidal Thoughts:  No  Memory:  Immediate;   Good Recent;   Fair Remote;   Fair  Judgement:  Fair  Insight:  Fair  Psychomotor Activity:  Normal  Concentration:  Fair  Recall:  [employer] of Knowledge:Good  Language: Good  Akathisia:  No  Handed:  Right  AIMS (if indicated):     Assets:  Agricultural consultant Housing Social Support  Sleep:     Cognition: WNL  ADL's:  Intact   Mental Status  Per Nursing Assessment::   On Admission:   Pt with a BAL of 196 and UDS positive for THC presented for detox and long term substance abuse rehabilitation. Pt has completed a program with [facility] and relapsed on alcohol after one month of sobriety. Pt will be seen by Peer Support and referred to outpatient substance abuse treatment facilities. Pt will also be referred to [facility] for medication management.   Demographic Factors:  Male, Low socioeconomic status and Unemployed  Loss Factors: Financial problems/change in socioeconomic status  Historical Factors: Family history of mental illness or substance abuse  Risk Reduction Factors:   Sense of responsibility to family  Continued Clinical Symptoms:  Alcohol/Substance Abuse/Dependencies  Cognitive Features That Contribute To Risk:  Closed-mindedness    Suicide Risk:  Minimal: No identifiable suicidal ideation.  Patients presenting with no risk factors but with morbid ruminations; may be classified as minimal risk based on the severity of the depressive symptoms    Plan Of Care/Follow-up recommendations:  Activity:  as tolerated Diet:  Heart healthy  Ethelene Hal, NP 05/12/2018, 12:33 PM
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
## 2018-05-12 NOTE — Progress Notes (Signed)
Patient ID: BOWDEN BOODY, male   DOB: [birth year], 52 y.o.   MRN: [id number]  Per Mason Jim, Peer Support Specialist, Pt has been accepted to [facility]. Accepting provider is Dr Odelia Gage. Call report to [phone]. Pt will go by [employer].  Ethelene Hal, NP-C 05-12-2018     [phone]

## 2018-05-12 NOTE — Patient Outreach (Signed)
Patient was accepted at [facility] in [place], [name] for detox for his alcohol use and other dual diagnosis needs. The accepting physician is Odelia Gage and the number to call for report [phone]. Patient will needs transportation by [facility]. [facility]PSS also provided the patient with information regarding [employer] and [facility] contact information.

## 2018-05-12 NOTE — BH Assessment (Signed)
[facility] Assessment Progress Note  Per Buford Dresser, DO, this pt does not require psychiatric hospitalization at this time.  Pt is to be discharged from [facility] with recommendation to continue treatment with [facility].  This has been included in pt's discharge instructions.  Pt would also benefit from seeing Peer Support Specialists; they will be asked to speak to pt.  Pt's nurse has been notified.  Jalene Mullet, [place] Triage Specialist [phone]

## 2018-05-12 NOTE — ED Provider Notes (Signed)
TIME SEEN: 12:23 AM  CHIEF COMPLAINT: Suicidal thoughts, alcohol use  HPI: Patient is a 52 year old male with history of CAD, substance abuse, bipolar disorder who presents to the emergency department with complaints of feeling suicidal.  Has had intermittent symptoms for years.  No specific plan today.  No HI or hallucinations.  Also reports drinking alcohol and smoking marijuana tonight.  Patient also complains of heartburn.  Has a burning sensation in his chest.  Does not feel like his anginal equivalent.  States this feels like when he has had heartburn.  Asking for a GI cocktail.  No nausea or vomiting.  No shortness of breath.  ROS: See HPI Constitutional: no fever  Eyes: no drainage  ENT: no runny nose   Cardiovascular:  burning chest pain  Resp: no SOB  GI: no vomiting GU: no dysuria Integumentary: no rash  Allergy: no hives  Musculoskeletal: no leg swelling  Neurological: no slurred speech ROS otherwise negative  PAST MEDICAL HISTORY/PAST SURGICAL HISTORY:  Past Medical History:  Diagnosis Date  . Abnormal ECG    a. early repolarization  . Arthritis    "my whole left side" (05/03/2014)  . Bipolar disorder ([place])   . Bradycardia    a. asymptomatic  . CAD in native artery    a. Nonobstructive cath 11/2007;  b. Presented with ST elevation - Nonobstructive cath 08/2011  . Chest pain, mid sternal   . Coronary artery disease   . GERD (gastroesophageal reflux disease)   . History of cocaine abuse ([place])    a. quit ? 2009  . History of ETOH abuse    a. drinks 2 "40's" / wk  . Marijuana abuse    a. uses ~ 1x /wk or less  . Pneumonia   . Stomach ulcer   . Syncope    a. 12/2010 - presumed to be vasovagal  . Tobacco abuse     MEDICATIONS:  Prior to Admission medications   Medication Sig Start Date End Date Taking? Authorizing Provider  pantoprazole (PROTONIX) 20 MG tablet Take 1 tablet (20 mg total) by mouth daily. 04/30/18  Yes Antonietta Breach, PA-C  ARIPiprazole (ABILIFY) 5  MG tablet Take 1 tablet (5 mg total) by mouth daily. For mood control Patient not taking: Reported on 04/30/2018 02/15/18   Lindell Spar I, NP  escitalopram (LEXAPRO) 5 MG tablet Take 1 tablet (5 mg total) by mouth daily. For depression Patient not taking: Reported on 04/30/2018 02/15/18   Lindell Spar I, NP  ondansetron (ZOFRAN ODT) 4 MG disintegrating tablet Take 1 tablet (4 mg total) by mouth every 8 (eight) hours as needed for nausea or vomiting. Patient not taking: Reported on 04/30/2018 02/19/18   Takari Duncombe, Ozella Almond, PA-C  traZODone (DESYREL) 50 MG tablet Take 1 tablet (50 mg total) by mouth at bedtime as needed for sleep. Patient not taking: Reported on 04/30/2018 02/14/18   Lindell Spar I, NP    ALLERGIES:  Allergies  Allergen Reactions  . Aspirin Other (See Comments)    Aggravates ulcer. "Causes chest pain."  . Pepperoni [Pickled Meat] Other (See Comments)    aggrevates ulcer  . Tomato Other (See Comments)    Foods with tomato sauce aggrevate ulcers  . Tylenol [Acetaminophen] Other (See Comments)    Pt reports hx of ulcers    SOCIAL HISTORY:  Social History   Tobacco Use  . Smoking status: Current Some Day Smoker    Packs/day: 0.50    Years: 20.00  Pack years: 10.00    Types: Cigarettes    Last attempt to quit: 06/24/2015    Years since quitting: 2.8  . Smokeless tobacco: Never Used  Substance Use Topics  . Alcohol use: Yes    Alcohol/week: 48.0 standard drinks    Types: 48 Cans of beer per week    Comment: 2 40 oz beers per day    FAMILY HISTORY: No family history on file.  EXAM: BP 116/70 (BP Location: Right Arm)   Pulse (!) 55   Temp 97.7 F (36.5 C) (Oral)   Resp 16   Ht 5\' 5"  (1.651 m)   Wt 52.2 kg   SpO2 96%   BMI 19.14 kg/m  CONSTITUTIONAL: Alert and oriented and responds appropriately to questions.  Chronically ill-appearing, smells of alcohol but does not appear significantly intoxicated HEAD: Normocephalic EYES: Conjunctivae clear, pupils  appear equal, EOMI ENT: normal nose; moist mucous membranes NECK: Supple, no meningismus, no nuchal rigidity, no LAD  CARD: RRR; S1 and S2 appreciated; no murmurs, no clicks, no rubs, no gallops RESP: Normal chest excursion without splinting or tachypnea; breath sounds clear and equal bilaterally; no wheezes, no rhonchi, no rales, no hypoxia or respiratory distress, speaking full sentences ABD/GI: Normal bowel sounds; non-distended; soft, non-tender, no rebound, no guarding, no peritoneal signs, no hepatosplenomegaly BACK:  The back appears normal and is non-tender to palpation, there is no CVA tenderness EXT: Normal ROM in all joints; non-tender to palpation; no edema; normal capillary refill; no cyanosis, no calf tenderness or swelling    SKIN: Normal color for age and race; warm; no rash NEURO: Moves all extremities equally PSYCH: Endorses SI without plan.  No HI or hallucinations.  Calm and cooperative.  MEDICAL DECISION MAKING: Patient here with complaints of suicidal thoughts.  Has been drinking alcohol today but does not appear significant intoxicated.  Is having burning chest pain but denies that this feels like his anginal equivalent.  States this is heartburn and is requesting GI cocktail.  EKG shows no ischemic abnormality.  Will obtain screening labs, urine.  Will consult TTS.  ED PROGRESS: Screening labs and urine unremarkable other than alcohol level of 196 and drug screen positive for THC.  TTS has evaluated patient and recommends reevaluation in the morning which I feel is reasonable.   I reviewed all nursing notes, vitals, pertinent previous records, EKGs, lab and urine results, imaging (as available).     EKG Interpretation  Date/Time:  Friday May 12 2018 00:40:24 EST Ventricular Rate:  54 PR Interval:  188 QRS Duration: 100 QT Interval:  410 QTC Calculation: 388 R Axis:   62 Text Interpretation:  Sinus bradycardia ST elevation, consider early repolarization  Borderline ECG No significant change since last tracing Confirmed by Ligia Duguay, Cyril Mourning [phone]) on 05/12/2018 1:02:50 AM           Beckett Hickmon, Delice Bison, DO 05/12/18 [id number]

## 2018-05-12 NOTE — ED Notes (Signed)
Questioned pt again if he was suicidal.  Pt stated "No.  I just don't like who I live with.  I live with my mother and father.  It's all the yelling that goes on at home."

## 2018-06-12 ENCOUNTER — Emergency Department (HOSPITAL_COMMUNITY)
Admission: EM | Admit: 2018-06-12 | Discharge: 2018-06-12 | Disposition: A | Discharge status: Home / Self Care | Payer: Medicaid Other | Attending: Emergency Medicine | Admitting: Emergency Medicine

## 2018-06-12 ENCOUNTER — Encounter (HOSPITAL_COMMUNITY): Payer: Self-pay | Admitting: Emergency Medicine

## 2018-06-12 DIAGNOSIS — F17210 Nicotine dependence, cigarettes, uncomplicated: Secondary | ICD-10-CM | POA: Diagnosis not present

## 2018-06-12 DIAGNOSIS — F1010 Alcohol abuse, uncomplicated: Secondary | ICD-10-CM

## 2018-06-12 DIAGNOSIS — F1220 Cannabis dependence, uncomplicated: Secondary | ICD-10-CM | POA: Diagnosis not present

## 2018-06-12 DIAGNOSIS — F329 Major depressive disorder, single episode, unspecified: Secondary | ICD-10-CM | POA: Diagnosis present

## 2018-06-12 DIAGNOSIS — F1020 Alcohol dependence, uncomplicated: Secondary | ICD-10-CM | POA: Diagnosis not present

## 2018-06-12 LAB — COMPREHENSIVE METABOLIC PANEL
ALT: 16 U/L (ref 0–44)
AST: 26 U/L (ref 15–41)
Albumin: 3.8 g/dL (ref 3.5–5.0)
Alkaline Phosphatase: 87 U/L (ref 38–126)
Anion gap: 12 (ref 5–15)
BUN: 8 mg/dL (ref 6–20)
CO2: 21 mmol/L — ABNORMAL LOW (ref 22–32)
Calcium: 8.5 mg/dL — ABNORMAL LOW (ref 8.9–10.3)
Chloride: 105 mmol/L (ref 98–111)
Creatinine, Ser: 1.11 mg/dL (ref 0.61–1.24)
GFR calc Af Amer: >60 mL/min (ref >60)
GFR calc non Af Amer: >60 mL/min (ref >60)
Glucose, Bld: 82 mg/dL (ref 70–99)
Potassium: 3.7 mmol/L (ref 3.5–5.1)
Sodium: 138 mmol/L (ref 135–145)
Total Bilirubin: 0.4 mg/dL (ref 0.3–1.2)
Total Protein: 7.4 g/dL (ref 6.5–8.1)

## 2018-06-12 LAB — CBC
HCT: 40.7 % (ref 39.0–52.0)
Hemoglobin: 13.9 g/dL (ref 13.0–17.0)
MCH: 31.7 pg (ref 26.0–34.0)
MCHC: 34.2 g/dL (ref 30.0–36.0)
MCV: 92.9 fL (ref 80.0–100.0)
Platelets: 282 10*3/uL (ref 150–400)
RBC: 4.38 MIL/uL (ref 4.22–5.81)
RDW: 13.1 % (ref 11.5–15.5)
WBC: 9.6 10*3/uL (ref 4.0–10.5)
nRBC: 0 % (ref 0.0–0.2)

## 2018-06-12 LAB — RAPID URINE DRUG SCREEN, HOSP PERFORMED
Amphetamines: NOT DETECTED
BARBITURATES: NOT DETECTED
Benzodiazepines: NOT DETECTED
Cocaine: NOT DETECTED
Opiates: NOT DETECTED
TETRAHYDROCANNABINOL: POSITIVE — AB

## 2018-06-12 LAB — ETHANOL: Alcohol, Ethyl (B): 110 mg/dL — ABNORMAL HIGH (ref <10)

## 2018-06-12 MED ORDER — VITAMIN B-1 100 MG PO TABS: 100.0000 mg | ORAL_TABLET | Freq: Every day | ORAL | Status: DC

## 2018-06-12 MED ORDER — THIAMINE HCL 100 MG/ML IJ SOLN: 100.0000 mg | Freq: Every day | INTRAMUSCULAR | Status: DC

## 2018-06-12 MED ORDER — LORAZEPAM 1 MG PO TABS: 0.0000 mg | ORAL_TABLET | Freq: Two times a day (BID) | ORAL | Status: DC

## 2018-06-12 MED ORDER — LORAZEPAM 1 MG PO TABS: 0.0000 mg | ORAL_TABLET | Freq: Four times a day (QID) | ORAL | Status: DC

## 2018-06-12 MED ORDER — CHLORDIAZEPOXIDE HCL 25 MG PO CAPS: ORAL_CAPSULE | ORAL | 0 refills | Status: DC

## 2018-06-12 MED ORDER — LORAZEPAM 2 MG/ML IJ SOLN: 0.0000 mg | Freq: Two times a day (BID) | INTRAMUSCULAR | Status: DC

## 2018-06-12 MED ORDER — LORAZEPAM 2 MG/ML IJ SOLN: 0.0000 mg | Freq: Four times a day (QID) | INTRAMUSCULAR | Status: DC

## 2018-06-12 NOTE — ED Triage Notes (Signed)
Pt was brought to the emergency department by [facility] co police department after his family called for per [employer] "threatening statements and alcohol use. Pt was picked up at convenient store and decided to come to the ED voluntary in order to receive help for making threatening statement and alcohol use. Pt states he does not remember any statement he made he made them while "black out drunk" and states he has been drinking 4-5 40 oz beers per day. Pt is calm and cooperative- states has been off of psych meds for over 6 months.

## 2018-06-12 NOTE — ED Provider Notes (Signed)
[facility] EMERGENCY DEPARTMENT Provider Note   CSN: [id number] Arrival date & time: 06/12/18  1526     History   Chief Complaint Chief Complaint  Patient presents with  . Drug / Alcohol Assessment    HPI [name] [name] is a 53 y.o. male.  Patient with history of polysubstance abuse, bipolar disorder, depression --presents the emergency department requesting help with alcohol.  In addition, he states that he has had worsening depression and fleeting suicidal thoughts.  He admits to being off his medications.  He has had a recent cough and cold-like symptoms.  He is also had some loose stools.  Last alcoholic beverage was this morning.  He is here at the encouragement of his family.  Also states increased stress due to the loss of a friend about a year ago.  He states that this causes him to drink sometimes.     Past Medical History:  Diagnosis Date  . Abnormal ECG    a. early repolarization  . Arthritis    "my whole left side" (05/03/2014)  . Bipolar disorder ([place])   . Bradycardia    a. asymptomatic  . CAD in native artery    a. Nonobstructive cath 11/2007;  b. Presented with ST elevation - Nonobstructive cath 08/2011  . Chest pain, mid sternal   . Coronary artery disease   . GERD (gastroesophageal reflux disease)   . History of cocaine abuse ([place])    a. quit ? 2009  . History of ETOH abuse    a. drinks 2 "40's" / wk  . Marijuana abuse    a. uses ~ 1x /wk or less  . Pneumonia   . Stomach ulcer   . Syncope    a. 12/2010 - presumed to be vasovagal  . Tobacco abuse     Patient Active Problem List   Diagnosis Date Noted  . MDD (major depressive disorder), recurrent severe, without psychosis ([place]) 02/08/2018  . Major depressive disorder, recurrent episode, severe with anxious distress ([place]) 05/23/2016  . PTSD (post-traumatic stress disorder) 05/23/2016  . Cocaine use disorder, moderate, in early remission ([place]) 05/23/2016  . Alcohol use disorder,  moderate, dependence ([place]) 05/23/2016  . Affective psychosis, bipolar ([place]) 04/30/2016  . Cocaine abuse with cocaine-induced mood disorder ([place]) 03/04/2016  . Alcohol dependence with uncomplicated withdrawal ([place])   . Alcohol use disorder, severe, dependence ([place]) 01/18/2015  . Alcohol dependence with alcohol-induced mood disorder ([place])   . Suicidal ideation   . Acute pancreatitis 03/20/2012  . Polysubstance abuse ([place]) 03/20/2012  . Cannabis abuse 01/12/2012  . Alcohol dependence ([place]) 10/15/2011  . Abnormal ECG   . Chest pain   . CAD in native artery   . GERD (gastroesophageal reflux disease)   . Tobacco abuse   . History of cocaine abuse ([place])   . History of ETOH abuse   . Bradycardia   . Syncope     Past Surgical History:  Procedure Laterality Date  . CARDIAC CATHETERIZATION  2009; 08/2011   [name] 08/06/2011  . LEFT HEART CATHETERIZATION WITH CORONARY ANGIOGRAM N/A 08/16/2011   Procedure: LEFT HEART CATHETERIZATION WITH CORONARY ANGIOGRAM;  Surgeon: [name], MD;  Location: [facility] CATH LAB;  Service: Cardiovascular;  Laterality: N/A;        Home Medications    Prior to Admission medications   Medication Sig Start Date End Date Taking? Authorizing Provider  ARIPiprazole (ABILIFY) 5 MG tablet Take 1 tablet (5 mg total) by mouth daily.  For mood control Patient not taking: Reported on 04/30/2018 02/15/18   Lindell Spar I, NP  escitalopram (LEXAPRO) 5 MG tablet Take 1 tablet (5 mg total) by mouth daily. For depression Patient not taking: Reported on 04/30/2018 02/15/18   Lindell Spar I, NP  ondansetron (ZOFRAN ODT) 4 MG disintegrating tablet Take 1 tablet (4 mg total) by mouth every 8 (eight) hours as needed for nausea or vomiting. Patient not taking: Reported on 04/30/2018 02/19/18   Ward, Ozella Almond, PA-C  pantoprazole (PROTONIX) 20 MG tablet Take 1 tablet (20 mg total) by mouth daily. 04/30/18   Antonietta Breach, PA-C  traZODone (DESYREL) 50 MG tablet Take 1 tablet (50  mg total) by mouth at bedtime as needed for sleep. Patient not taking: Reported on 04/30/2018 02/14/18   Lindell Spar I, NP    Family History No family history on file.  Social History Social History   Tobacco Use  . Smoking status: Current Some Day Smoker    Packs/day: 0.50    Years: 20.00    Pack years: 10.00    Types: Cigarettes    Last attempt to quit: 06/24/2015    Years since quitting: 2.9  . Smokeless tobacco: Never Used  Substance Use Topics  . Alcohol use: Yes    Alcohol/week: 48.0 standard drinks    Types: 48 Cans of beer per week    Comment: 2 40 oz beers per day  . Drug use: Yes    Types: Marijuana, Cocaine     Allergies   Aspirin; Pepperoni [pickled meat]; Tomato; and Tylenol [acetaminophen]   Review of Systems Review of Systems  Constitutional: Negative for fever.  HENT: Positive for congestion. Negative for rhinorrhea and sore throat.   Eyes: Negative for redness.  Respiratory: Positive for cough.   Cardiovascular: Negative for chest pain.  Gastrointestinal: Positive for diarrhea. Negative for abdominal pain, nausea and vomiting.  Genitourinary: Negative for dysuria.  Musculoskeletal: Negative for myalgias.  Skin: Negative for rash.  Neurological: Negative for headaches.  Psychiatric/Behavioral: Positive for dysphoric mood. Negative for suicidal ideas (none currently).     Physical Exam Updated Vital Signs BP 121/74 (BP Location: Right Arm)   Pulse (!) 57   Temp 98 F (36.7 C) (Oral)   Resp 18   SpO2 100%   Physical Exam Vitals signs and nursing note reviewed.  Constitutional:      Appearance: He is well-developed.  HENT:     Head: Normocephalic and atraumatic.  Eyes:     General:        Right eye: No discharge.        Left eye: No discharge.     Conjunctiva/sclera: Conjunctivae normal.  Neck:     Musculoskeletal: Normal range of motion and neck supple.  Cardiovascular:     Rate and Rhythm: Normal rate and regular rhythm.     Heart  sounds: Normal heart sounds.  Pulmonary:     Effort: Pulmonary effort is normal.     Breath sounds: Normal breath sounds.  Abdominal:     Palpations: Abdomen is soft.     Tenderness: There is no abdominal tenderness.  Skin:    General: Skin is warm and dry.  Neurological:     Mental Status: He is alert.      ED Treatments / Results  Labs (all labs ordered are listed, but only abnormal results are displayed) Labs Reviewed  COMPREHENSIVE METABOLIC PANEL - Abnormal; Notable for the following components:  Result Value   CO2 21 (*)    Calcium 8.5 (*)    All other components within normal limits  ETHANOL - Abnormal; Notable for the following components:   Alcohol, Ethyl (B) 110 (*)    All other components within normal limits  CBC  RAPID URINE DRUG SCREEN, HOSP PERFORMED    EKG None  Radiology No results found.  Procedures Procedures (including critical care time)  Medications Ordered in ED Medications  LORazepam (ATIVAN) injection 0-4 mg (has no administration in time range)    Or  LORazepam (ATIVAN) tablet 0-4 mg (has no administration in time range)  LORazepam (ATIVAN) injection 0-4 mg (has no administration in time range)    Or  LORazepam (ATIVAN) tablet 0-4 mg (has no administration in time range)  thiamine (VITAMIN B-1) tablet 100 mg (has no administration in time range)    Or  thiamine (B-1) injection 100 mg (has no administration in time range)     Initial Impression / Assessment and Plan / ED Course  I have reviewed the triage vital signs and the nursing notes.  Pertinent labs & imaging results that were available during my care of the patient were reviewed by me and considered in my medical decision making (see chart for details).     Patient seen and examined. Work-up initiated. Medications ordered.   Vital signs reviewed and are as follows: BP 107/68   Pulse (!) 53   Temp 98 F (36.7 C) (Oral)   Resp 18   SpO2 97%   9:52 PM Pt medically  cleared.  Polysubstance abuse with underlying psychiatric problems, off medications.  Will request TTS consult.  11:23 PM TTS evaluation completed.  Recommend [facility] at [facility].  Spoke with patient.  He is upset about being discharged.  Discussed that we do not have residential inpatient treatment for alcohol abuse at [facility] or through behavioral health.  I provided him with a prescription for Librium.  I have given him residential inpatient treatment plan referrals.  He is encouraged to follow-up.  Patient agreeable.  Final Clinical Impressions(s) / ED Diagnoses   Final diagnoses:  Alcohol abuse   Patient with poly-substance abuse, currently with heavy drinking.  TTS evaluation completed as above.  Current recommendation is for discharge, outpatient follow-up.  Patient given inpatient treatment referrals.  In the interim, provided with Librium for any withdrawal symptoms.  ED Discharge Orders    None       Carlisle Cater, Hershal Coria 06/12/18 [id number]    Carmin Muskrat, MD 06/13/18 [phone]

## 2018-06-12 NOTE — ED Notes (Signed)
Pt wanded and belongings placed in locker #6 

## 2018-06-12 NOTE — ED Notes (Signed)
Pt denies SI/HI 

## 2018-06-12 NOTE — Discharge Instructions (Signed)
Please read and follow all provided instructions.  Your diagnoses today include:  1. Alcohol abuse     Tests performed today include:  Blood counts and electrolytes  Vital signs. See below for your results today.   Medications prescribed:   Librium -take as directed to prevent severe alcohol withdrawal   Take any prescribed medications only as directed.  Home care instructions:  Follow any educational materials contained in this packet.  BE VERY CAREFUL not to take multiple medicines containing Tylenol (also called acetaminophen). Doing so can lead to an overdose which can damage your liver and cause liver failure and possibly death.   Follow-up instructions: Please follow-up with your primary care provider in the next 3 days for further evaluation of your symptoms.   Return instructions:   Please return to the Emergency Department if you experience worsening symptoms.   Please return if you have any other emergent concerns.  Additional Information:  Your vital signs today were: BP 107/68    Pulse (!) 53    Temp 98 F (36.7 C) (Oral)    Resp 18    SpO2 97%  If your blood pressure (BP) was elevated above 135/85 this visit, please have this repeated by your doctor within one month. --------------

## 2018-06-12 NOTE — BH Assessment (Addendum)
Assessment Note  [name] [name] is an 53 y.o. male.  The pt came in after making "threatening statements" earlier today.  The pt drinks alcohol everyday and doesn't remember what he said.  The pt stated he becomes aggressive when he drinks.  The pt denies SI and HI.  He has a history of SI, but denies that currently. The pt reported he had a suicide attempt in 2007 when he overdosed on medication.  The pt had an attempt earlier than that attempt, when he jumped out of a moving car.  The pt drinks 3-4 40 oz beers per day.  The longest he has been without alcohol is about one month. His blood alcohol level was 110.  The pt also uses marijuana and uses everyday.  His last usage was today.  He was last inpatient at [facility] 05/2018.  The pt isn't seeing a counselor or psychiatrist currently.  The pt lives with his mother and father.  The pt denies self harm, legal issues, history of abuse and hallucinations.  The pt denies ever having seizures or hallucinations while detoxing.  The pt stated he has a hard time staying sleep and has a fair appetite.    Pt is dressed in scrubs. He is alert and oriented x4. Pt speaks in a clear tone, at moderate volume and normal pace. Eye contact is good. Pt's mood is pleasant. Thought process is coherent and relevant. There is no indication Pt is currently responding to internal stimuli or experiencing delusional thought content.?Pt was cooperative throughout assessment.    Diagnosis: F10.20 Alcohol use disorder, Severe F12.20 Cannabis use disorder, Moderate   Past Medical History:  Past Medical History:  Diagnosis Date  . Abnormal ECG    a. early repolarization  . Arthritis    "my whole left side" (05/03/2014)  . Bipolar disorder ([place])   . Bradycardia    a. asymptomatic  . CAD in native artery    a. Nonobstructive cath 11/2007;  b. Presented with ST elevation - Nonobstructive cath 08/2011  . Chest pain, mid sternal   . Coronary artery disease   . GERD  (gastroesophageal reflux disease)   . History of cocaine abuse ([place])    a. quit ? 2009  . History of ETOH abuse    a. drinks 2 "40's" / wk  . Marijuana abuse    a. uses ~ 1x /wk or less  . Pneumonia   . Stomach ulcer   . Syncope    a. 12/2010 - presumed to be vasovagal  . Tobacco abuse     Past Surgical History:  Procedure Laterality Date  . CARDIAC CATHETERIZATION  2009; 08/2011   [name] 08/06/2011  . LEFT HEART CATHETERIZATION WITH CORONARY ANGIOGRAM N/A 08/16/2011   Procedure: LEFT HEART CATHETERIZATION WITH CORONARY ANGIOGRAM;  Surgeon: [name], MD;  Location: [facility] CATH LAB;  Service: Cardiovascular;  Laterality: N/A;    Family History: No family history on file.  Social History:  reports that he has been smoking cigarettes. He has a 10.00 pack-year smoking history. He has never used smokeless tobacco. He reports current alcohol use of about 48.0 standard drinks of alcohol per week. He reports current drug use. Drugs: Marijuana and Cocaine.  Additional Social History:  Alcohol / Drug Use Pain Medications: See MAR Prescriptions: See MAR Over the Counter: See MAR History of alcohol / drug use?: Yes Longest period of sobriety (when/how long): one month Substance #1 Name of Substance 1: alcohol 1 -  Age of First Use: 13 1 - Amount (size/oz): 3-4 40 oz beers 1 - Frequency: daily 1 - Duration: on going 1 - Last Use / Amount: 06/12/2018 Substance #2 Name of Substance 2: Marijuana 2 - Age of First Use: 10 2 - Amount (size/oz): 10-20 dollars  2 - Frequency: daily 2 - Duration: on going 2 - Last Use / Amount: 06/12/2018  CIWA: CIWA-Ar BP: 107/68 Pulse Rate: (!) 53 Nausea and Vomiting: no nausea and no vomiting Tactile Disturbances: none Tremor: no tremor Auditory Disturbances: not present Paroxysmal Sweats: no sweat visible Visual Disturbances: not present Anxiety: no anxiety, at ease Headache, Fullness in Head: none present Agitation: normal activity Orientation  and Clouding of Sensorium: oriented and can do serial additions CIWA-Ar Total: 0 COWS:    Allergies:  Allergies  Allergen Reactions  . Aspirin Other (See Comments)    Aggravates ulcer. "Causes chest pain."  . Pepperoni [Pickled Meat] Other (See Comments)    aggrevates ulcer  . Tomato Other (See Comments)    Foods with tomato sauce aggrevate ulcers  . Tylenol [Acetaminophen] Other (See Comments)    Pt reports hx of ulcers    Home Medications: (Not in a hospital admission)   OB/GYN Status:  No LMP for male patient.  General Assessment Data Location of Assessment: [facility] ED TTS Assessment: In system Is this a Tele or Face-to-Face Assessment?: Face-to-Face Is this an Initial Assessment or a Re-assessment for this encounter?: Initial Assessment Patient Accompanied by:: N/A Language Other than English: No Living Arrangements: Other (Comment)(home) What gender do you identify as?: Male Marital status: Single Maiden name: NA Pregnancy Status: No Living Arrangements: Parent Can pt return to current living arrangement?: Yes Admission Status: Voluntary Is patient capable of signing voluntary admission?: Yes Referral Source: Self/Family/Friend Insurance type: medicaid     Crisis Care Plan Living Arrangements: Parent Legal Guardian: Other:(Self) Name of Psychiatrist: None currently Name of Therapist: none  Education Status Is patient currently in school?: No Is the patient employed, unemployed or receiving disability?: Receiving disability income  Risk to self with the past 6 months Suicidal Ideation: No-Not Currently/Within Last 6 Months Has patient been a risk to self within the past 6 months prior to admission? : Yes Suicidal Intent: No Has patient had any suicidal intent within the past 6 months prior to admission? : No Is patient at risk for suicide?: No Suicidal Plan?: No-Not Currently/Within Last 6 Months Has patient had any suicidal plan within the past 6 months  prior to admission? : No Specify Current Suicidal Plan: NA Specify Access to Suicidal Means: pt denies SI What has been your use of drugs/alcohol within the last 12 months?: alcohol and marijuana use Previous Attempts/Gestures: Yes How many times?: 2 Other Self Harm Risks: none Triggers for Past Attempts: Unknown Intentional Self Injurious Behavior: None Family Suicide History: No Recent stressful life event(s): Other (Comment)(pt denies) Persecutory voices/beliefs?: No Depression: No Substance abuse history and/or treatment for substance abuse?: Yes Suicide prevention information given to non-admitted patients: Yes  Risk to Others within the past 6 months Homicidal Ideation: No Does patient have any lifetime risk of violence toward others beyond the six months prior to admission? : No Thoughts of Harm to Others: No Current Homicidal Intent: No Current Homicidal Plan: No Access to Homicidal Means: No Identified Victim: pt denies HI History of harm to others?: No Assessment of Violence: None Noted Does patient have access to weapons?: Yes (Comment)(guns and knives) Criminal Charges Pending?: No Does patient  have a court date: No Is patient on probation?: No  Psychosis Hallucinations: None noted Delusions: None noted  Mental Status Report Appearance/Hygiene: In scrubs, Unremarkable Eye Contact: Good Motor Activity: Freedom of movement, Unremarkable Speech: Logical/coherent Level of Consciousness: Alert Mood: Pleasant Affect: Appropriate to circumstance Anxiety Level: None Thought Processes: Coherent, Relevant Judgement: Partial Orientation: Person, Place, Time, Situation Obsessive Compulsive Thoughts/Behaviors: None  Cognitive Functioning Concentration: Normal Memory: Recent Intact, Remote Intact Is patient IDD: No Insight: Fair Impulse Control: Fair Appetite: Fair Have you had any weight changes? : No Change Sleep: Decreased Total Hours of Sleep: 5 Vegetative  Symptoms: None  ADLScreening [facility] Assessment Services) Patient's cognitive ability adequate to safely complete daily activities?: Yes Patient able to express need for assistance with ADLs?: Yes Independently performs ADLs?: Yes (appropriate for developmental age)  Prior Inpatient Therapy Prior Inpatient Therapy: Yes Prior Therapy Dates: 05/2018 Prior Therapy Facilty/Provider(s): [place], [facility], [place], [place]. Reason for Treatment: Depression, alcohol.  Prior Outpatient Therapy Prior Outpatient Therapy: Yes Prior Therapy Dates: 11/2017 Prior Therapy Facilty/Provider(s): [facility].  Reason for Treatment: Medication management. Does patient have an ACCT team?: No Does patient have Intensive In-House Services?  : No Does patient have [facility] services? : No Does patient have P4[facility] services?: No  ADL Screening (condition at time of admission) Patient's cognitive ability adequate to safely complete daily activities?: Yes Patient able to express need for assistance with ADLs?: Yes Independently performs ADLs?: Yes (appropriate for developmental age)       Abuse/Neglect Assessment (Assessment to be complete while patient is alone) Abuse/Neglect Assessment Can Be Completed: Yes Physical Abuse: Denies Verbal Abuse: Denies Sexual Abuse: Denies Self-Neglect: Denies Values / Beliefs Cultural Requests During Hospitalization: None Spiritual Requests During Hospitalization: None Consults Spiritual Care Consult Needed: No Social Work Consult Needed: No            Disposition:  Disposition Initial Assessment Completed for this Encounter: Yes  NP Lindon Romp recommends the pt discharge and follow up with [facility].  Pt's RN and MD were made aware of the recommendations.  On Site Evaluation by:   Reviewed with Physician:    Enzo Montgomery 06/12/2018 10:46 PM

## 2018-10-27 NOTE — Progress Notes (Signed)
COVID Hotel Screening performed. Temperature, PHQ-9, and need for medical care and medications assessed. No additional needs assessed at this time.  Nivek Powley RN MSN 

## 2018-10-29 NOTE — Progress Notes (Signed)
COVID Hotel Screening performed. Temperature, PHQ-9, and need for medical care and medications assessed. No additional needs assessed at this time.  Krisalyn Yankowski RN MSN 

## 2018-10-31 ENCOUNTER — Telehealth: Payer: Self-pay | Admitting: *Deleted

## 2018-10-31 DIAGNOSIS — Z20822 Contact with and (suspected) exposure to covid-19: Secondary | ICD-10-CM

## 2018-10-31 NOTE — Telephone Encounter (Signed)
Order placed for COVID-19 testing.  

## 2018-11-03 NOTE — Progress Notes (Signed)
COVID Hotel Screening performed. Temperature, PHQ-9, and need for medical care and medications assessed. Patient agreed to the COVID-19 testing. No additional needs assessed at this time.  Drue Camera RN MSN 

## 2018-11-08 LAB — NOVEL CORONAVIRUS, NAA: SARS-CoV-2, NAA: NOT DETECTED

## 2018-11-09 ENCOUNTER — Telehealth: Payer: Self-pay

## 2018-11-09 ENCOUNTER — Encounter: Payer: Self-pay | Admitting: *Deleted

## 2018-11-09 NOTE — Telephone Encounter (Signed)
Attempted to call patient and inform them of COVID results. Invalid number given.

## 2018-11-26 ENCOUNTER — Encounter (HOSPITAL_COMMUNITY): Payer: Self-pay | Admitting: Emergency Medicine

## 2018-11-26 ENCOUNTER — Encounter (HOSPITAL_COMMUNITY): Payer: Self-pay | Admitting: Nurse Practitioner

## 2018-11-26 ENCOUNTER — Inpatient Hospital Stay (HOSPITAL_COMMUNITY)
Admission: AD | Admit: 2018-11-26 | Discharge: 2018-11-30 | DRG: 885 | Disposition: A | Discharge status: Home / Self Care | Payer: Medicaid Other | Source: Intra-hospital | Attending: Psychiatry | Admitting: Psychiatry

## 2018-11-26 ENCOUNTER — Emergency Department (HOSPITAL_COMMUNITY): Payer: Medicaid Other

## 2018-11-26 ENCOUNTER — Emergency Department (HOSPITAL_COMMUNITY)
Admission: EM | Admit: 2018-11-26 | Discharge: 2018-11-26 | Disposition: A | Discharge status: Other Acute Inpatient Hospital | Payer: Medicaid Other | Attending: Emergency Medicine | Admitting: Emergency Medicine

## 2018-11-26 ENCOUNTER — Other Ambulatory Visit: Payer: Self-pay

## 2018-11-26 DIAGNOSIS — Z91410 Personal history of adult physical and sexual abuse: Secondary | ICD-10-CM | POA: Diagnosis not present

## 2018-11-26 DIAGNOSIS — F4310 Post-traumatic stress disorder, unspecified: Secondary | ICD-10-CM | POA: Diagnosis present

## 2018-11-26 DIAGNOSIS — Z20828 Contact with and (suspected) exposure to other viral communicable diseases: Secondary | ICD-10-CM | POA: Diagnosis present

## 2018-11-26 DIAGNOSIS — F1010 Alcohol abuse, uncomplicated: Secondary | ICD-10-CM | POA: Diagnosis present

## 2018-11-26 DIAGNOSIS — F332 Major depressive disorder, recurrent severe without psychotic features: Secondary | ICD-10-CM | POA: Diagnosis present

## 2018-11-26 DIAGNOSIS — I2510 Atherosclerotic heart disease of native coronary artery without angina pectoris: Secondary | ICD-10-CM | POA: Diagnosis present

## 2018-11-26 DIAGNOSIS — R45851 Suicidal ideations: Secondary | ICD-10-CM | POA: Diagnosis present

## 2018-11-26 DIAGNOSIS — Z91018 Allergy to other foods: Secondary | ICD-10-CM | POA: Diagnosis not present

## 2018-11-26 DIAGNOSIS — F819 Developmental disorder of scholastic skills, unspecified: Secondary | ICD-10-CM | POA: Diagnosis present

## 2018-11-26 DIAGNOSIS — F17210 Nicotine dependence, cigarettes, uncomplicated: Secondary | ICD-10-CM | POA: Insufficient documentation

## 2018-11-26 DIAGNOSIS — Z91411 Personal history of adult psychological abuse: Secondary | ICD-10-CM

## 2018-11-26 DIAGNOSIS — Z886 Allergy status to analgesic agent status: Secondary | ICD-10-CM

## 2018-11-26 DIAGNOSIS — Z590 Homelessness: Secondary | ICD-10-CM

## 2018-11-26 DIAGNOSIS — Z79899 Other long term (current) drug therapy: Secondary | ICD-10-CM | POA: Insufficient documentation

## 2018-11-26 DIAGNOSIS — F10229 Alcohol dependence with intoxication, unspecified: Secondary | ICD-10-CM | POA: Insufficient documentation

## 2018-11-26 DIAGNOSIS — F319 Bipolar disorder, unspecified: Principal | ICD-10-CM | POA: Diagnosis present

## 2018-11-26 DIAGNOSIS — Z818 Family history of other mental and behavioral disorders: Secondary | ICD-10-CM

## 2018-11-26 DIAGNOSIS — M1990 Unspecified osteoarthritis, unspecified site: Secondary | ICD-10-CM | POA: Diagnosis present

## 2018-11-26 DIAGNOSIS — K219 Gastro-esophageal reflux disease without esophagitis: Secondary | ICD-10-CM | POA: Diagnosis present

## 2018-11-26 DIAGNOSIS — Z9119 Patient's noncompliance with other medical treatment and regimen: Secondary | ICD-10-CM | POA: Insufficient documentation

## 2018-11-26 DIAGNOSIS — Y906 Blood alcohol level of 120-199 mg/100 ml: Secondary | ICD-10-CM | POA: Diagnosis present

## 2018-11-26 DIAGNOSIS — Z811 Family history of alcohol abuse and dependence: Secondary | ICD-10-CM

## 2018-11-26 DIAGNOSIS — F333 Major depressive disorder, recurrent, severe with psychotic symptoms: Secondary | ICD-10-CM | POA: Insufficient documentation

## 2018-11-26 LAB — COMPREHENSIVE METABOLIC PANEL
ALT: 39 U/L (ref 0–44)
AST: 45 U/L — ABNORMAL HIGH (ref 15–41)
Albumin: 4.2 g/dL (ref 3.5–5.0)
Alkaline Phosphatase: 70 U/L (ref 38–126)
Anion gap: 11 (ref 5–15)
BUN: 9 mg/dL (ref 6–20)
CO2: 26 mmol/L (ref 22–32)
Calcium: 9 mg/dL (ref 8.9–10.3)
Chloride: 106 mmol/L (ref 98–111)
Creatinine, Ser: 0.97 mg/dL (ref 0.61–1.24)
GFR calc Af Amer: >60 mL/min (ref >60)
GFR calc non Af Amer: >60 mL/min (ref >60)
Glucose, Bld: 106 mg/dL — ABNORMAL HIGH (ref 70–99)
Potassium: 3.1 mmol/L — ABNORMAL LOW (ref 3.5–5.1)
Sodium: 143 mmol/L (ref 135–145)
Total Bilirubin: 0.3 mg/dL (ref 0.3–1.2)
Total Protein: 7.7 g/dL (ref 6.5–8.1)

## 2018-11-26 LAB — CBC
HCT: 41.2 % (ref 39.0–52.0)
Hemoglobin: 14 g/dL (ref 13.0–17.0)
MCH: 31.7 pg (ref 26.0–34.0)
MCHC: 34 g/dL (ref 30.0–36.0)
MCV: 93.4 fL (ref 80.0–100.0)
Platelets: 240 10*3/uL (ref 150–400)
RBC: 4.41 MIL/uL (ref 4.22–5.81)
RDW: 14.1 % (ref 11.5–15.5)
WBC: 5.8 10*3/uL (ref 4.0–10.5)
nRBC: 0 % (ref 0.0–0.2)

## 2018-11-26 LAB — RAPID URINE DRUG SCREEN, HOSP PERFORMED
Amphetamines: NOT DETECTED
Barbiturates: NOT DETECTED
Benzodiazepines: NOT DETECTED
Cocaine: NOT DETECTED
Opiates: NOT DETECTED
Tetrahydrocannabinol: POSITIVE — AB

## 2018-11-26 LAB — ACETAMINOPHEN LEVEL: Acetaminophen (Tylenol), Serum: <10 ug/mL — ABNORMAL LOW (ref 10–30)

## 2018-11-26 LAB — SARS CORONAVIRUS 2 BY RT PCR (HOSPITAL ORDER, PERFORMED IN ~~LOC~~ HOSPITAL LAB): SARS Coronavirus 2: NEGATIVE

## 2018-11-26 LAB — ETHANOL: Alcohol, Ethyl (B): 198 mg/dL — ABNORMAL HIGH (ref <10)

## 2018-11-26 LAB — SALICYLATE LEVEL: Salicylate Lvl: <7 mg/dL (ref 2.8–30.0)

## 2018-11-26 LAB — TROPONIN I: Troponin I: <0.03 ng/mL (ref <0.03)

## 2018-11-26 MED ORDER — HYDROXYZINE HCL 25 MG PO TABS: 25.0000 mg | ORAL_TABLET | Freq: Four times a day (QID) | ORAL | Status: DC | PRN

## 2018-11-26 MED ORDER — ESCITALOPRAM OXALATE 10 MG PO TABS
5.0000 mg | ORAL_TABLET | Freq: Every day | ORAL | Status: DC
  Administered 2018-11-26: 5 mg via ORAL
  Filled 2018-11-26: qty 1

## 2018-11-26 MED ORDER — LIDOCAINE VISCOUS HCL 2 % MT SOLN
15.0000 mL | Freq: Once | OROMUCOSAL | Status: DC
  Filled 2018-11-26: qty 15

## 2018-11-26 MED ORDER — NICOTINE 21 MG/24HR TD PT24
21.0000 mg | MEDICATED_PATCH | Freq: Once | TRANSDERMAL | Status: DC
  Filled 2018-11-26: qty 1

## 2018-11-26 MED ORDER — ALUM & MAG HYDROXIDE-SIMETH 200-200-20 MG/5ML PO SUSP: 30.0000 mL | ORAL | Status: DC | PRN

## 2018-11-26 MED ORDER — LORAZEPAM 1 MG PO TABS: 0.0000 mg | ORAL_TABLET | Freq: Four times a day (QID) | ORAL | Status: DC

## 2018-11-26 MED ORDER — ACAMPROSATE CALCIUM 333 MG PO TBEC
333.0000 mg | DELAYED_RELEASE_TABLET | Freq: Three times a day (TID) | ORAL | Status: DC
  Administered 2018-11-26 – 2018-11-30 (×12): 333 mg via ORAL
  Filled 2018-11-26 (×18): qty 1

## 2018-11-26 MED ORDER — ALUM & MAG HYDROXIDE-SIMETH 200-200-20 MG/5ML PO SUSP
30.0000 mL | Freq: Once | ORAL | Status: AC
  Administered 2018-11-26: 30 mL via ORAL
  Filled 2018-11-26: qty 30

## 2018-11-26 MED ORDER — CHLORDIAZEPOXIDE HCL 25 MG PO CAPS: 25.0000 mg | ORAL_CAPSULE | Freq: Four times a day (QID) | ORAL | Status: DC | PRN

## 2018-11-26 MED ORDER — ENSURE ENLIVE PO LIQD
237.0000 mL | Freq: Two times a day (BID) | ORAL | Status: DC
  Administered 2018-11-28 (×2): 237 mL via ORAL

## 2018-11-26 MED ORDER — ESCITALOPRAM OXALATE 10 MG PO TABS
10.0000 mg | ORAL_TABLET | Freq: Every day | ORAL | Status: DC
  Administered 2018-11-27 – 2018-11-30 (×4): 10 mg via ORAL
  Filled 2018-11-26 (×7): qty 1

## 2018-11-26 MED ORDER — HYDROXYZINE PAMOATE 25 MG PO CAPS: 25.0000 mg | ORAL_CAPSULE | Freq: Four times a day (QID) | ORAL | Status: DC | PRN

## 2018-11-26 MED ORDER — PANTOPRAZOLE SODIUM 20 MG PO TBEC
20.0000 mg | DELAYED_RELEASE_TABLET | Freq: Every day | ORAL | Status: DC
  Administered 2018-11-26: 20 mg via ORAL
  Filled 2018-11-26: qty 1

## 2018-11-26 MED ORDER — PANTOPRAZOLE SODIUM 20 MG PO TBEC
20.0000 mg | DELAYED_RELEASE_TABLET | Freq: Every day | ORAL | Status: DC
  Filled 2018-11-26: qty 1

## 2018-11-26 MED ORDER — ESCITALOPRAM OXALATE 5 MG PO TABS: 5.0000 mg | ORAL_TABLET | Freq: Every day | ORAL | Status: DC

## 2018-11-26 MED ORDER — GABAPENTIN 300 MG PO CAPS
300.0000 mg | ORAL_CAPSULE | Freq: Two times a day (BID) | ORAL | Status: DC
  Administered 2018-11-26: 300 mg via ORAL
  Filled 2018-11-26: qty 1

## 2018-11-26 MED ORDER — GABAPENTIN 300 MG PO CAPS
300.0000 mg | ORAL_CAPSULE | Freq: Two times a day (BID) | ORAL | Status: DC
  Administered 2018-11-26 – 2018-11-30 (×8): 300 mg via ORAL
  Filled 2018-11-26 (×13): qty 1

## 2018-11-26 MED ORDER — LORAZEPAM 2 MG/ML IJ SOLN: 0.0000 mg | Freq: Two times a day (BID) | INTRAMUSCULAR | Status: DC

## 2018-11-26 MED ORDER — PNEUMOCOCCAL VAC POLYVALENT 25 MCG/0.5ML IJ INJ: 0.5000 mL | INJECTION | INTRAMUSCULAR | Status: DC

## 2018-11-26 MED ORDER — THIAMINE HCL 100 MG/ML IJ SOLN: 100.0000 mg | Freq: Every day | INTRAMUSCULAR | Status: DC

## 2018-11-26 MED ORDER — VITAMIN B-1 100 MG PO TABS
100.0000 mg | ORAL_TABLET | Freq: Every day | ORAL | Status: DC
  Administered 2018-11-27 – 2018-11-30 (×4): 100 mg via ORAL
  Filled 2018-11-26 (×6): qty 1

## 2018-11-26 MED ORDER — MAGNESIUM HYDROXIDE 400 MG/5ML PO SUSP: 30.0000 mL | Freq: Every day | ORAL | Status: DC | PRN

## 2018-11-26 MED ORDER — NICOTINE 21 MG/24HR TD PT24: 21.0000 mg | MEDICATED_PATCH | Freq: Once | TRANSDERMAL | Status: DC

## 2018-11-26 MED ORDER — LORAZEPAM 2 MG/ML IJ SOLN: 0.0000 mg | Freq: Four times a day (QID) | INTRAMUSCULAR | Status: DC

## 2018-11-26 MED ORDER — TRAZODONE HCL 50 MG PO TABS
50.0000 mg | ORAL_TABLET | Freq: Every evening | ORAL | Status: DC | PRN
  Administered 2018-11-26 – 2018-11-29 (×4): 50 mg via ORAL
  Filled 2018-11-26 (×4): qty 1

## 2018-11-26 MED ORDER — PANTOPRAZOLE SODIUM 40 MG PO TBEC
40.0000 mg | DELAYED_RELEASE_TABLET | Freq: Every day | ORAL | Status: DC
  Administered 2018-11-27 – 2018-11-30 (×4): 40 mg via ORAL
  Filled 2018-11-26 (×6): qty 1

## 2018-11-26 MED ORDER — VITAMIN B-1 100 MG PO TABS
100.0000 mg | ORAL_TABLET | Freq: Every day | ORAL | Status: DC
  Filled 2018-11-26: qty 1

## 2018-11-26 MED ORDER — THIAMINE HCL 100 MG/ML IJ SOLN
100.0000 mg | Freq: Every day | INTRAMUSCULAR | Status: DC
  Administered 2018-11-26: 100 mg via INTRAVENOUS

## 2018-11-26 MED ORDER — ACAMPROSATE CALCIUM 333 MG PO TBEC
333.0000 mg | DELAYED_RELEASE_TABLET | Freq: Three times a day (TID) | ORAL | Status: DC
  Administered 2018-11-26: 333 mg via ORAL
  Filled 2018-11-26 (×2): qty 1

## 2018-11-26 MED ORDER — LORAZEPAM 1 MG PO TABS: 0.0000 mg | ORAL_TABLET | Freq: Two times a day (BID) | ORAL | Status: DC

## 2018-11-26 MED ORDER — VITAMIN B-1 100 MG PO TABS: 100.0000 mg | ORAL_TABLET | Freq: Every day | ORAL | Status: DC

## 2018-11-26 MED ORDER — HYDROXYZINE HCL 25 MG PO TABS
25.0000 mg | ORAL_TABLET | Freq: Four times a day (QID) | ORAL | Status: DC | PRN
  Administered 2018-11-29: 25 mg via ORAL
  Filled 2018-11-26: qty 1

## 2018-11-26 MED ORDER — TRAZODONE HCL 50 MG PO TABS: 50.0000 mg | ORAL_TABLET | Freq: Every evening | ORAL | Status: DC | PRN

## 2018-11-26 MED ORDER — FOLIC ACID 1 MG PO TABS
1.0000 mg | ORAL_TABLET | Freq: Every day | ORAL | Status: DC
  Administered 2018-11-26 – 2018-11-30 (×5): 1 mg via ORAL
  Filled 2018-11-26 (×7): qty 1

## 2018-11-26 NOTE — ED Triage Notes (Signed)
Pt reports having suicidal ideation with plan of jumping off of a bridge for over a week.

## 2018-11-26 NOTE — ED Notes (Signed)
Bed: [facility] Expected date:  Expected time:  Means of arrival:  Comments: 

## 2018-11-26 NOTE — Progress Notes (Signed)
D: Pt was in hallway upon initial approach.  Pt presents with depressed affect and mood.  He forwards little information to Probation officer.  He reports he is "feeling okay."  Denied having a goal so Probation officer and pt made goal for pt to be safe.  Pt denies SI/HI, denies hallucinations, denies pain.  Pt has been visible in milieu intermittently interacting with staff and peers cautiously.    A: Introduced self to pt.  Met with pt 1:1.  Actively listened to pt and offered support and encouragement.  PRN medication administered for sleep.  15 minute safety checks in place.  R: Pt is safe on the unit.  Pt is compliant with medication.  Pt verbally contracts for safety.  Will continue to monitor and assess.

## 2018-11-26 NOTE — Progress Notes (Signed)
Patient ID: [name] [name], male   DOB: Jun 29, 1965, 53 y.o.   MRN: [id number] Patient is a 53 year old male admitted voluntarily from [facility].  Pt self reports a history of MDD, tension headaches, cardiac  Bypass surgery x 2 in the early 1990s,alcohol abuse and acid reflux disease.  Pt denies SI/HI/AVH, but reports past suicide attempts in 1992 and 1993 where he took a bunch of pills and walked in front of a car respectively.  Pt states that he has been admitted to [facility] several times prior, and is here this time to get treatment for alcohol abuse and depression.  Pt reports that he drinks a 12 pack of beer daily, and also uses $10 to $15 worth of marijuana daily.  Pt reports that his stressors are homelessness and financial issues, and reports that he currently lives between his family member's houses. Pt reports recently being hospitalized at [facility] a month ago.   Pt is calm and cooperative with care, and Q15 minute checks have been implemented, report given to assigned RN.

## 2018-11-26 NOTE — Progress Notes (Signed)
Psychoeducational Group Note  Date:  11/26/2018 Time:  2030  Group Topic/Focus:  wrap up group  Participation Level: Did Not Attend  Participation Quality:  Not Applicable  Affect:  Not Applicable  Cognitive:  Not Applicable  Insight:  Not Applicable  Engagement in Group: Not Applicable  Additional Comments: Pt was notified that group was beginning but remained in bed.   Shellia Cleverly 11/26/2018, 9:47 PM

## 2018-11-26 NOTE — Progress Notes (Signed)
Received [name] [name] from triage, alert and oriented to his new environment. He was given liquids per his request.He slept until his interview with TTS then assessed verbally and CIWA was 3. He verbalized feeling anxious, depressed and anger related to life not being fair to him.
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
## 2018-11-26 NOTE — ED Notes (Signed)
Report given to [place] at [facility] and [place] transportation called.

## 2018-11-26 NOTE — Progress Notes (Signed)
Patient ID: [name] [name], male   DOB: Jan 28, 1966, 53 y.o.   MRN: [id number]  Pt is requires inpatient psychiatric admission. Pt has been accepted to [facility], bed 304-2. He may come after 2:00 PM.   Ethelene Hal, FNP-C 11/26/2018     [id number]

## 2018-11-26 NOTE — ED Provider Notes (Signed)
[facility] DEPT Provider Note   CSN: [id number] Arrival date & time: 11/26/18  0107    History   Chief Complaint Chief Complaint  Patient presents with  . Suicidal    HPI [name] [name] is a 53 y.o. male.     Patient presents to the emergency department asking for help with suicidal ideation.  Patient has been contemplating jumping off a bridge for 1 week.  He also is fearful that he might hurt someone else.  He has having trouble with anger.  Patient reports psychiatric inpatient hospitalization approximately a month ago, but has not been on his meds.  He reports that his "support system" has turned their backs on him.  He has not been able to financially afford his meds.  Patient also reports he has been having chest pain.  This is been ongoing for some time.  He reports intermittent sharp stabbing pains in the left chest that lasts for a few seconds and then resolved.  No associated shortness of breath.  No continuous chest pain.  No exertional chest pain.     Past Medical History:  Diagnosis Date  . Abnormal ECG    a. early repolarization  . Arthritis    "my whole left side" (05/03/2014)  . Bipolar disorder ([place])   . Bradycardia    a. asymptomatic  . CAD in native artery    a. Nonobstructive cath 11/2007;  b. Presented with ST elevation - Nonobstructive cath 08/2011  . Chest pain, mid sternal   . Coronary artery disease   . GERD (gastroesophageal reflux disease)   . History of cocaine abuse ([place])    a. quit ? 2009  . History of ETOH abuse    a. drinks 2 "40's" / wk  . Marijuana abuse    a. uses ~ 1x /wk or less  . Pneumonia   . Stomach ulcer   . Syncope    a. 12/2010 - presumed to be vasovagal  . Tobacco abuse     Patient Active Problem List   Diagnosis Date Noted  . MDD (major depressive disorder), recurrent severe, without psychosis ([place]) 02/08/2018  . Major depressive disorder, recurrent episode, severe with anxious distress  ([place]) 05/23/2016  . PTSD (post-traumatic stress disorder) 05/23/2016  . Cocaine use disorder, moderate, in early remission ([place]) 05/23/2016  . Alcohol use disorder, moderate, dependence ([place]) 05/23/2016  . Affective psychosis, bipolar ([place]) 04/30/2016  . Cocaine abuse with cocaine-induced mood disorder ([place]) 03/04/2016  . Alcohol dependence with uncomplicated withdrawal ([place])   . Alcohol use disorder, severe, dependence ([place]) 01/18/2015  . Alcohol dependence with alcohol-induced mood disorder ([place])   . Suicidal ideation   . Acute pancreatitis 03/20/2012  . Polysubstance abuse ([place]) 03/20/2012  . Cannabis abuse 01/12/2012  . Alcohol dependence ([place]) 10/15/2011  . Abnormal ECG   . Chest pain   . CAD in native artery   . GERD (gastroesophageal reflux disease)   . Tobacco abuse   . History of cocaine abuse ([place])   . History of ETOH abuse   . Bradycardia   . Syncope     Past Surgical History:  Procedure Laterality Date  . CARDIAC CATHETERIZATION  2009; 08/2011   [name] 08/06/2011  . LEFT HEART CATHETERIZATION WITH CORONARY ANGIOGRAM N/A 08/16/2011   Procedure: LEFT HEART CATHETERIZATION WITH CORONARY ANGIOGRAM;  Surgeon: [name], MD;  Location: [facility] CATH LAB;  Service: Cardiovascular;  Laterality: N/A;        Home  Medications    Prior to Admission medications   Medication Sig Start Date End Date Taking? Authorizing Provider  acamprosate (CAMPRAL) 333 MG tablet Take 333 mg by mouth 3 (three) times daily. 08/28/18  Yes [provider]  escitalopram (LEXAPRO) 5 MG tablet Take 5 mg by mouth daily. 08/28/18  Yes [provider]  hydrOXYzine (VISTARIL) 25 MG capsule Take 25 mg by mouth every 6 (six) hours as needed for anxiety or vomiting.  08/28/18  Yes [provider]  pantoprazole (PROTONIX) 20 MG tablet Take 20 mg by mouth daily. 08/28/18  Yes [provider]  traZODone (DESYREL) 50 MG tablet Take 50 mg by mouth at bedtime as needed for  sleep.  08/28/18  Yes [provider]  chlordiazePOXIDE (LIBRIUM) 25 MG capsule 50mg  PO TID x 1D, then 25-50mg  PO BID X 1D, then 25-50mg  PO QD X 1D Patient not taking: Reported on 11/26/2018 06/12/18   Carlisle Cater, PA-C    Family History History reviewed. No pertinent family history.  Social History Social History   Tobacco Use  . Smoking status: Current Some Day Smoker    Packs/day: 0.50    Years: 20.00    Pack years: 10.00    Types: Cigarettes    Last attempt to quit: 06/24/2015    Years since quitting: 3.4  . Smokeless tobacco: Never Used  Substance Use Topics  . Alcohol use: Yes    Alcohol/week: 48.0 standard drinks    Types: 48 Cans of beer per week    Comment: 2 40 oz beers per day  . Drug use: Yes    Types: Marijuana, Cocaine     Allergies   Aspirin, Pepperoni [pickled meat], Tomato, and Tylenol [acetaminophen]   Review of Systems Review of Systems  Cardiovascular: Positive for chest pain.  Psychiatric/Behavioral: Positive for dysphoric mood and suicidal ideas.  All other systems reviewed and are negative.    Physical Exam Updated Vital Signs BP (!) 129/94 (BP Location: Left Arm)   Pulse 99   Temp 98.1 F (36.7 C) (Oral)   Resp 16   Ht 5\' 4"  (1.626 m)   Wt 52.2 kg   SpO2 96%   BMI 19.74 kg/m   Physical Exam Vitals signs and nursing note reviewed.  Constitutional:      General: He is not in acute distress.    Appearance: Normal appearance. He is well-developed.  HENT:     Head: Normocephalic and atraumatic.     Right Ear: Hearing normal.     Left Ear: Hearing normal.     Nose: Nose normal.  Eyes:     Conjunctiva/sclera: Conjunctivae normal.     Pupils: Pupils are equal, round, and reactive to light.  Neck:     Musculoskeletal: Normal range of motion and neck supple.  Cardiovascular:     Rate and Rhythm: Regular rhythm.     Heart sounds: S1 normal and S2 normal. No murmur. No friction rub. No gallop.   Pulmonary:     Effort:  Pulmonary effort is normal. No respiratory distress.     Breath sounds: Normal breath sounds.  Chest:     Chest wall: No tenderness.  Abdominal:     General: Bowel sounds are normal.     Palpations: Abdomen is soft.     Tenderness: There is no abdominal tenderness. There is no guarding or rebound. Negative signs include Murphy's sign and McBurney's sign.     Hernia: No hernia is present.  Musculoskeletal: Normal  range of motion.  Skin:    General: Skin is warm and dry.     Findings: No rash.  Neurological:     Mental Status: He is alert and oriented to person, place, and time.     GCS: GCS eye subscore is 4. GCS verbal subscore is 5. GCS motor subscore is 6.     Cranial Nerves: No cranial nerve deficit.     Sensory: No sensory deficit.     Coordination: Coordination normal.  Psychiatric:        Mood and Affect: Mood is depressed.        Speech: Speech normal.        Behavior: Behavior normal.        Thought Content: Thought content includes suicidal ideation. Thought content includes suicidal plan.      ED Treatments / Results  Labs (all labs ordered are listed, but only abnormal results are displayed) Labs Reviewed  COMPREHENSIVE METABOLIC PANEL - Abnormal; Notable for the following components:      Result Value   Potassium 3.1 (*)    Glucose, Bld 106 (*)    AST 45 (*)    All other components within normal limits  ETHANOL - Abnormal; Notable for the following components:   Alcohol, Ethyl (B) 198 (*)    All other components within normal limits  ACETAMINOPHEN LEVEL - Abnormal; Notable for the following components:   Acetaminophen (Tylenol), Serum <10 (*)    All other components within normal limits  SALICYLATE LEVEL  CBC  TROPONIN I  RAPID URINE DRUG SCREEN, HOSP PERFORMED    EKG EKG Interpretation  Date/Time:  Sunday November 26 2018 01:26:09 EDT Ventricular Rate:  76 PR Interval:    QRS Duration: 91 QT Interval:  370 QTC Calculation: 416 R Axis:   27 Text  Interpretation:  Sinus rhythm ST elev, probable normal early repol pattern No significant change since last tracing Confirmed by Orpah Greek [phone]) on 11/26/2018 1:40:55 AM   Radiology Dg Chest 2 View  Result Date: 11/26/2018 CLINICAL DATA:  53 y/o  M; chest pain. History of smoking. EXAM: CHEST - 2 VIEW COMPARISON:  04/29/2018 chest radiograph. FINDINGS: Stable heart size and mediastinal contours are within normal limits. Hyperinflated lungs. Both lungs are clear. The visualized skeletal structures are unremarkable. IMPRESSION: No acute pulmonary process identified.  Stable chest radiograph. Electronically Signed   By: Kristine Garbe M.D.   On: 11/26/2018 02:14    Procedures Procedures (including critical care time)  Medications Ordered in ED Medications  alum & mag hydroxide-simeth (MAALOX/MYLANTA) 200-200-20 MG/5ML suspension 30 mL (has no administration in time range)    And  lidocaine (XYLOCAINE) 2 % viscous mouth solution 15 mL (has no administration in time range)  pantoprazole (PROTONIX) EC tablet 20 mg (has no administration in time range)  LORazepam (ATIVAN) injection 0-4 mg (has no administration in time range)    Or  LORazepam (ATIVAN) tablet 0-4 mg (has no administration in time range)  LORazepam (ATIVAN) injection 0-4 mg (has no administration in time range)    Or  LORazepam (ATIVAN) tablet 0-4 mg (has no administration in time range)  thiamine (VITAMIN B-1) tablet 100 mg (has no administration in time range)    Or  thiamine (B-1) injection 100 mg (has no administration in time range)  nicotine (NICODERM CQ - dosed in mg/24 hours) patch 21 mg (has no administration in time range)     Initial Impression / Assessment and Plan /  ED Course  I have reviewed the triage vital signs and the nursing notes.  Pertinent labs & imaging results that were available during my care of the patient were reviewed by me and considered in my medical decision making (see  chart for details).        Patient presents to the emergency department for evaluation of depression.  Patient reports that he has been contemplating committing suicide by jumping off of a bridge.  He has multiple stressors.  He does not have any social support at this time.  Patient complaining of chest pain.  This is been ongoing for weeks or longer.  Is very atypical for cardiac etiology.  He experiences intermittent sharp stabbing pains that last for a few seconds at a time.  Chest x-ray normal.  Troponin normal.  EKG unremarkable.  Patient medically clear for psychiatric evaluation.  Final Clinical Impressions(s) / ED Diagnoses   Final diagnoses:  Suicidal ideation    ED Discharge Orders    None       Shauntea Lok, Gwenyth Allegra, MD 11/26/18 [id number]

## 2018-11-26 NOTE — Patient Outreach (Signed)
ED Peer Support Specialist Patient Intake (Complete at intake & 30-60 Day Follow-up)  Name: [name] [name]  MRN: [id number]  Age: 53 y.o.   Date of Admission: 11/26/2018  Intake: Initial Comments:      Primary Reason Admitted: Suicide an Alcohol Abuse.   Lab values: Alcohol/ETOH: Positive Positive UDS? No Amphetamines: No Barbiturates: No Benzodiazepines: No Cocaine: No Opiates: No Cannabinoids: No  Demographic information: Gender: Male Ethnicity: African American Marital Status: Single Insurance Status: Medicaid Ecologist (Work Neurosurgeon, Physicist, medical, etc.: Yes(SSI) Lives with: Alone Living situation: Other (comment)  Reported Patient History: Patient reported health conditions: Bipolar disorder, Depression Patient aware of HIV and hepatitis status: No  In past year, has patient visited ED for any reason? No  Number of ED visits:    Reason(s) for visit:    In past year, has patient been hospitalized for any reason? No  Number of hospitalizations:    Reason(s) for hospitalization:    In past year, has patient been arrested? No  Number of arrests:    Reason(s) for arrest:    In past year, has patient been incarcerated? No  Number of incarcerations:    Reason(s) for incarceration:    In past year, has patient received medication-assisted treatment? No  In past year, patient received the following treatments: Residential treatment (non-hospital)([facility])  In past year, has patient received any harm reduction services? No  Did this include any of the following?    In past year, has patient received care from a mental health provider for diagnosis other than SUD? No  In past year, is this first time patient has overdosed? No  Number of past overdoses:    In past year, is this first time patient has been hospitalized for an overdose? No  Number of hospitalizations for overdose(s):    Is patient currently receiving  treatment for a mental health diagnosis? No  Patient reports experiencing difficulty participating in SUD treatment: No    Most important reason(s) for this difficulty?    Has patient received prior services for treatment? No  In past, patient has received services from following agencies:    Plan of Care:  Suggested follow up at these agencies/treatment centers: Other (comment)([facility] wants to get Pt involved with ACTT services for housing an Med Management)  Other information: [facility] met with Pt and talked about what has brought him in. Pt stated how he is doing and what he has been dealing with in the community. [facility] made Pt aware that he may benefit from ACTT services, [facility] explained what services are provided and how they can assist him getting his life back on track. CPSS made Pt aware that [facility]PSS will follow up with Pt in the community.   [name], [name]  11/26/2018 12:08 PM

## 2018-11-27 DIAGNOSIS — F332 Major depressive disorder, recurrent severe without psychotic features: Secondary | ICD-10-CM

## 2018-11-27 MED ORDER — CHLORDIAZEPOXIDE HCL 5 MG PO CAPS
10.0000 mg | ORAL_CAPSULE | Freq: Three times a day (TID) | ORAL | Status: AC
  Administered 2018-11-27 – 2018-11-28 (×5): 10 mg via ORAL
  Filled 2018-11-27 (×5): qty 2

## 2018-11-27 NOTE — [facility] Suicide Risk Assessment (Signed)
[facility] Admission Suicide Risk Assessment   Nursing information obtained from:    Demographic factors:  Male Current Mental Status:  NA Loss Factors:  Financial problems / change in socioeconomic status Historical Factors:  Prior suicide attempts Risk Reduction Factors:  Positive social support  Total Time spent with patient: 45 minutes Principal Problem: Requesting alcohol detox/cannabis use as well Diagnosis:  Active Problems:   MDD (major depressive disorder), recurrent severe, without psychosis ([place])  Subjective Data: Patient states he stays with various family members I get the impression he has nowhere firmed up to stay but he request detox off of alcohol and cannabis  Continued Clinical Symptoms:  Alcohol Use Disorder Identification Test Final Score (AUDIT): 33 The "Alcohol Use Disorders Identification Test", Guidelines for Use in Primary Care, Second Edition.  World Pharmacologist [name]). Score between 0-7:  no or low risk or alcohol related problems. Score between 8-15:  moderate risk of alcohol related problems. Score between 16-19:  high risk of alcohol related problems. Score 20 or above:  warrants further diagnostic evaluation for alcohol dependence and treatment.   CLINICAL FACTORS:   Alcohol/Substance Abuse/Dependencies   Musculoskeletal: Strength & Muscle Tone: within normal limits Gait & Station: normal Patient leans: N/A  Psychiatric Specialty Exam: Physical Exam  ROS  Blood pressure 116/78, pulse (!) 54, temperature 97.8 F (36.6 C), temperature source Oral, resp. rate 16, height 5\' 4"  (1.626 m), weight 51.3 kg, SpO2 100 %.Body mass index is 19.4 kg/m.  General Appearance: Casual  Eye Contact:  Good  Speech:  Clear and Coherent  Volume:  Normal  Mood:  Euthymic  Affect:  Congruent  Thought Process:  Goal Directed and Descriptions of Associations: Intact  Orientation:  Full (Time, Place, and Person)  Thought Content:  Logical  Suicidal Thoughts:  No   Homicidal Thoughts:  No  Memory:  Recent;   Good  Judgement:  Good  Insight:  Fair  Psychomotor Activity:  Normal  Concentration:  Concentration: Fair  Recall:  [employer] of Knowledge:  Fair  Language:  Fair  Akathisia:  Negative  Handed:  Right  AIMS (if indicated):     Assets:  Physical Health Resilience  ADL's:  Intact  Cognition:  WNL  Sleep:  Number of Hours: 4.75      COGNITIVE FEATURES THAT CONTRIBUTE TO RISK:  Closed-mindedness    SUICIDE RISK:   Minimal: No identifiable suicidal ideation.  Patients presenting with no risk factors but with morbid ruminations; may be classified as minimal risk based on the severity of the depressive symptoms  PLAN OF CARE: Detox underway  I certify that inpatient services furnished can reasonably be expected to improve the patient's condition.   [name] Hai, MD 11/27/2018, 10:26 AM
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
## 2018-11-27 NOTE — Plan of Care (Signed)
Nurse discussed anxiety, depression, coping skills with patient. 
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
## 2018-11-27 NOTE — Tx Team (Signed)
Interdisciplinary Treatment and Diagnostic Plan Update  11/27/2018 Time of Session: 9:00am [name] [name] MRN: [id number]  Principal Diagnosis: <principal problem not specified>  Secondary Diagnoses: Active Problems:   MDD (major depressive disorder), recurrent severe, without psychosis ([place])   Current Medications:  Current Facility-Administered Medications  Medication Dose Route Frequency Provider Last Rate Last Dose  . acamprosate (CAMPRAL) tablet 333 mg  333 mg Oral TID Sharma Covert, MD   333 mg at 11/27/18 1212  . alum & mag hydroxide-simeth (MAALOX/MYLANTA) 200-200-20 MG/5ML suspension 30 mL  30 mL Oral Q4H PRN Sharma Covert, MD      . chlordiazePOXIDE (LIBRIUM) capsule 10 mg  10 mg Oral TID [name] Hai, MD   10 mg at 11/27/18 1214  . chlordiazePOXIDE (LIBRIUM) capsule 25 mg  25 mg Oral QID PRN Sharma Covert, MD      . escitalopram (LEXAPRO) tablet 10 mg  10 mg Oral Daily Sharma Covert, MD   10 mg at 11/27/18 0912  . feeding supplement (ENSURE ENLIVE) (ENSURE ENLIVE) liquid 237 mL  237 mL Oral BID BM Sharma Covert, MD      . folic acid (FOLVITE) tablet 1 mg  1 mg Oral Daily Sharma Covert, MD   1 mg at 11/27/18 0913  . gabapentin (NEURONTIN) capsule 300 mg  300 mg Oral BID Sharma Covert, MD   300 mg at 11/27/18 2800  . hydrOXYzine (ATARAX/VISTARIL) tablet 25 mg  25 mg Oral Q6H PRN Sharma Covert, MD      . magnesium hydroxide (MILK OF MAGNESIA) suspension 30 mL  30 mL Oral Daily PRN Sharma Covert, MD      . pantoprazole (PROTONIX) EC tablet 40 mg  40 mg Oral Daily Sharma Covert, MD   40 mg at 11/27/18 0913  . pneumococcal 23 valent vaccine (PNU-IMMUNE) injection 0.5 mL  0.5 mL Intramuscular Tomorrow-1000 Sharma Covert, MD      . thiamine (VITAMIN B-1) tablet 100 mg  100 mg Oral Daily Sharma Covert, MD   100 mg at 11/27/18 3491   Or  . thiamine (B-1) injection 100 mg  100 mg Intravenous Daily Sharma Covert, MD      .  traZODone (DESYREL) tablet 50 mg  50 mg Oral QHS PRN Sharma Covert, MD   50 mg at 11/26/18 2158   PTA Medications: Medications Prior to Admission  Medication Sig Dispense Refill Last Dose  . acamprosate (CAMPRAL) 333 MG tablet Take 333 mg by mouth 3 (three) times daily.     . chlordiazePOXIDE (LIBRIUM) 25 MG capsule 7m PO TID x 1D, then 25-550mPO BID X 1D, then 25-5048mO QD X 1D (Patient not taking: Reported on 11/26/2018) 10 capsule 0   . escitalopram (LEXAPRO) 5 MG tablet Take 5 mg by mouth daily.     . hydrOXYzine (VISTARIL) 25 MG capsule Take 25 mg by mouth every 6 (six) hours as needed for anxiety or vomiting.      . pantoprazole (PROTONIX) 20 MG tablet Take 20 mg by mouth daily.     . traZODone (DESYREL) 50 MG tablet Take 50 mg by mouth at bedtime as needed for sleep.        Patient Stressors:    Patient Strengths:    Treatment Modalities: Medication Management, Group therapy, Case management,  1 to 1 session with clinician, Psychoeducation, Recreational therapy.   Physician Treatment Plan for Primary Diagnosis: <principal problem not specified>  Long Term Goal(s): Improvement in symptoms so as ready for discharge Improvement in symptoms so as ready for discharge   Short Term Goals: Ability to identify and develop effective coping behaviors will improve Ability to maintain clinical measurements within normal limits will improve Ability to identify triggers associated with substance abuse/mental health issues will improve Ability to identify and develop effective coping behaviors will improve Ability to maintain clinical measurements within normal limits will improve Compliance with prescribed medications will improve  Medication Management: Evaluate patient's response, side effects, and tolerance of medication regimen.  Therapeutic Interventions: 1 to 1 sessions, Unit Group sessions and Medication administration.  Evaluation of Outcomes: Not Met  Physician Treatment  Plan for Secondary Diagnosis: Active Problems:   MDD (major depressive disorder), recurrent severe, without psychosis ([place])  Long Term Goal(s): Improvement in symptoms so as ready for discharge Improvement in symptoms so as ready for discharge   Short Term Goals: Ability to identify and develop effective coping behaviors will improve Ability to maintain clinical measurements within normal limits will improve Ability to identify triggers associated with substance abuse/mental health issues will improve Ability to identify and develop effective coping behaviors will improve Ability to maintain clinical measurements within normal limits will improve Compliance with prescribed medications will improve     Medication Management: Evaluate patient's response, side effects, and tolerance of medication regimen.  Therapeutic Interventions: 1 to 1 sessions, Unit Group sessions and Medication administration.  Evaluation of Outcomes: Not Met   RN Treatment Plan for Primary Diagnosis: <principal problem not specified> Long Term Goal(s): Knowledge of disease and therapeutic regimen to maintain health will improve  Short Term Goals: Ability to remain free from injury will improve, Ability to verbalize frustration and anger appropriately will improve, Ability to identify and develop effective coping behaviors will improve and Compliance with prescribed medications will improve  Medication Management: RN will administer medications as ordered by provider, will assess and evaluate patient's response and provide education to patient for prescribed medication. RN will report any adverse and/or side effects to prescribing provider.  Therapeutic Interventions: 1 on 1 counseling sessions, Psychoeducation, Medication administration, Evaluate responses to treatment, Monitor vital signs and CBGs as ordered, Perform/monitor CIWA, COWS, AIMS and Fall Risk screenings as ordered, Perform wound care treatments as  ordered.  Evaluation of Outcomes: Not Met   LCSW Treatment Plan for Primary Diagnosis: <principal problem not specified> Long Term Goal(s): Safe transition to appropriate next level of care at discharge, Engage patient in therapeutic group addressing interpersonal concerns.  Short Term Goals: Engage patient in aftercare planning with referrals and resources, Increase social support, Identify triggers associated with mental health/substance abuse issues and Increase skills for wellness and recovery  Therapeutic Interventions: Assess for all discharge needs, 1 to 1 time with Social worker, Explore available resources and support systems, Assess for adequacy in community support network, Educate family and significant other(s) on suicide prevention, Complete Psychosocial Assessment, Interpersonal group therapy.  Evaluation of Outcomes: Not Met   Progress in Treatment: Attending groups: No. Participating in groups: No. Taking medication as prescribed: Yes. Toleration medication: Yes. Family/Significant other contact made: No, will contact:  supports if consents are granted. Patient understands diagnosis: Yes. Discussing patient identified problems/goals with staff: Yes. Medical problems stabilized or resolved: Yes. Denies suicidal/homicidal ideation: Yes. Issues/concerns per patient self-inventory: Yes.   New problem(s) identified: No, Describe:  none  New Short Term/Long Term Goal(s): detox, medication management for mood stabilization; elimination of SI thoughts; development of comprehensive mental wellness/sobriety plan.  Patient Goals:    Discharge Plan or Barriers: CSW continuing to assess for appropriate referrals.  Reason for Continuation of Hospitalization: Anxiety Depression Withdrawal symptoms  Estimated Length of Stay: 1-3 days  Attendees: Patient: 11/27/2018 12:35 PM  Physician:  11/27/2018 12:35 PM  Nursing:  11/27/2018 12:35 PM  RN Care Manager: 11/27/2018 12:35 PM   Social Worker: Stephanie Acre, Latanya Presser 11/27/2018 12:35 PM  Recreational Therapist:  11/27/2018 12:35 PM  Other:  11/27/2018 12:35 PM  Other:  11/27/2018 12:35 PM  Other: 11/27/2018 12:35 PM    Scribe for Treatment Team: Joellen [place], [place] 11/27/2018 12:35 PM

## 2018-11-27 NOTE — Progress Notes (Signed)
Patient did not attend wrap up group. 

## 2018-11-27 NOTE — Plan of Care (Signed)
D: Patient is alert, pleasant, and cooperative. Denies SI, HI, AVH, and verbally contracts for safety. Affect is anxious. Patient denies physical symptoms/pain.    A: Medications administered per MD order. Support provided. Patient educated on safety on the unit and medications. Routine safety checks every 15 minutes. Patient stated understanding to tell nurse about any new physical symptoms. Patient understands to tell staff of any needs.     R: No adverse drug reactions noted. Patient verbally contracts for safety. Patient remains safe at this time and will continue to monitor.   Problem: Safety: Goal: Ability to remain free from injury will improve Outcome: Progressing   Problem: Education: Goal: Knowledge of [facility] Education information/materials will improve Outcome: Progressing   Patient is oriented to the unit. Patient remains safe and will continue to monitor.   [place] NOVEL CORONAVIRUS (COVID-19) DAILY CHECK-OFF SYMPTOMS - answer yes or no to each - every day NO YES  Have you had a fever in the past 24 hours?  Fever (Temp > 37.80C / 100F) X   Have you had any of these symptoms in the past 24 hours? New Cough  Sore Throat   Shortness of Breath  Difficulty Breathing  Unexplained Body Aches   X   Have you had any one of these symptoms in the past 24 hours not related to allergies?   Runny Nose  Nasal Congestion  Sneezing   X   If you have had runny nose, nasal congestion, sneezing in the past 24 hours, has it worsened?  X   EXPOSURES - check yes or no X   Have you traveled outside the state in the past 14 days?  X   Have you been in contact with someone with a confirmed diagnosis of COVID-19 or PUI in the past 14 days without wearing appropriate PPE?  X   Have you been living in the same home as a person with confirmed diagnosis of COVID-19 or a PUI (household contact)?    X   Have you been diagnosed with COVID-19?    X              What to do next:  Answered NO to all: Answered YES to anything:   Proceed with unit schedule Follow the [facility] Inpatient Flowsheet.

## 2018-11-27 NOTE — Progress Notes (Signed)
NUTRITION ASSESSMENT RD working remotely.   Pt identified as at risk on the Malnutrition Screen Tool  INTERVENTION: - continue Ensure Enlive BID, each supplement provides 350 kcal and 20 grams of protein. - continue to encourage PO intakes.   NUTRITION DIAGNOSIS: Unintentional weight loss related to sub-optimal intake as evidenced by pt report.   Goal: Pt to meet >/= 90% of their estimated nutrition needs.  Monitor:  PO intake  Assessment:   Patient admitted for SI with a plan to jump off of a bridge. Patient reported not taking medications as prescribed PTA and that he has been drinking and taking drugs. Patient has also been experiencing auditory hallucinations. Patient is unemployed.   Per chart review, current weight is 113 lb and weight on 05/11/18 was 115 lb. Two lb weight loss in 6 months is not significant for time frame. Ensure Enlive ordered BID per [facility] protocol on admission; continue this supplement at this time.      53 y.o. male  Height: Ht Readings from Last 1 Encounters:  11/26/18 5\' 4"  (1.626 m)    Weight: Wt Readings from Last 1 Encounters:  11/26/18 51.3 kg    Weight Hx: Wt Readings from Last 10 Encounters:  11/26/18 51.3 kg  11/26/18 52.2 kg  05/11/18 52.2 kg  04/29/18 52.2 kg  02/16/18 54 kg  02/08/18 52.2 kg  11/16/17 59 kg  11/05/17 58.5 kg  04/11/17 50.8 kg  02/05/17 59 kg    BMI:  Body mass index is 19.4 kg/m. Pt meets criteria for normal weight based on current BMI.  Estimated Nutritional Needs: Kcal: 25-30 kcal/kg Protein: > 1 gram protein/kg Fluid: 1 ml/kcal  Diet Order:  Diet Order            Diet regular Room service appropriate? No; Fluid consistency: Thin  Diet effective now             Pt is also offered choice of unit snacks mid-morning and mid-afternoon.  Pt is eating as desired.   Lab results and medications reviewed.     Jarome Matin, MS, RD, LDN, [facility] Inpatient Clinical Dietitian Pager #  [phone] After hours/weekend pager # [phone]

## 2018-11-27 NOTE — Progress Notes (Signed)
D:  Patient denied SI and HI, contracts for safety.  Denied A/V hallucinations.   A:  Medications administered per MD orders.  Emotional support and encouragement given patient. R:  Safety maintained with 15 minute checks.  

## 2018-11-28 MED ORDER — CLONIDINE HCL 0.2 MG PO TABS
0.2000 mg | ORAL_TABLET | Freq: Two times a day (BID) | ORAL | Status: DC
  Administered 2018-11-28: 0.2 mg via ORAL
  Filled 2018-11-28 (×8): qty 1

## 2018-11-28 MED ORDER — IBUPROFEN 600 MG PO TABS
600.0000 mg | ORAL_TABLET | Freq: Four times a day (QID) | ORAL | Status: DC | PRN
  Administered 2018-11-28 – 2018-11-29 (×3): 600 mg via ORAL
  Filled 2018-11-28 (×4): qty 1

## 2018-11-28 MED ORDER — KETOROLAC TROMETHAMINE 60 MG/2ML IM SOLN
60.0000 mg | Freq: Once | INTRAMUSCULAR | Status: AC
  Administered 2018-11-28: 60 mg via INTRAMUSCULAR
  Filled 2018-11-28 (×2): qty 2

## 2018-11-28 NOTE — Progress Notes (Signed)
CSW spoke with patient on unit regarding discharge planning.  Patient is expected to discharge tomorrow. He has been referred to [facility] for residential substance use treatment, but he will not be able to discharge from our facility to [facility]. He is primarily concerned with housing.  Patient reports he is on the [facility] ACT Team, but he appears to be on the [facility] PATH team. He reportedly spoke to [place] while at [facility], and has had disagreements.   Patient inquired about being referred to [facility] ACT Team. CSW explained that patient does not meet ACTT criteria as he has only been psychiatrically hospitalized twice within the past year. Patient also does not have a diagnosis that would warrant the need for an ACT Team (MDD).  Stephanie Acre, LCSW-A Clinical Social Worker

## 2018-11-28 NOTE — Plan of Care (Signed)
Progress note  D: pt found in the dayroom; compliant with medication administration. Pt denies any physical complaints, but does complain of lower back pain that he rates at a 10/10. Pt is animated and anxious about this. Pt denies si/hi/ah/vh and verbally agrees to approach staff if these become apparent or before harming himself/others while at [place].  A: pt provided support and encouragement. Pt given medication per protocol and standing orders. Q42m safety checks implemented and continued.  R: pt safe on the unit. Will continue to monitor.   Pt progressing in the following metrics  Problem: Education: Goal: Knowledge of disease or condition will improve Outcome: Progressing Goal: Understanding of discharge needs will improve Outcome: Progressing   Problem: Health Behavior/Discharge Planning: Goal: Ability to identify changes in lifestyle to reduce recurrence of condition will improve Outcome: Progressing Goal: Identification of resources available to assist in meeting health care needs will improve Outcome: Progressing

## 2018-11-28 NOTE — Progress Notes (Signed)
Patient ID: [name] [name], male   DOB: 01-29-1966, 53 y.o.   MRN: [id number] D: Patient at nurses station on approach. Pt reports feeling anxious about possible discharge tomorrow. Pt reports he does not have a safe place to go. Denies  SI/HI/AVH and pain.No behavioral issues noted.  A: Support and encouragement offered as needed to express needs. Medications administered as prescribed.  R: Patient is safe and cooperative on unit. Will continue to monitor  for safety and stability.

## 2018-11-28 NOTE — [facility] Group Notes (Signed)
[facility] Mental Health Association Group Therapy 11/28/2018 1:55 PM  Type of Therapy: Mental Health Association Presentation  Participation Level: Active  Participation Quality: Attentive  Affect: Appropriate  Cognitive: Oriented  Insight: Developing/Improving  Engagement in Therapy: Engaged  Modes of Intervention: Discussion, Education and Socialization   Summary of Progress/Problems: [place] ([place]) Speaker came to talk about his personal journey with mental health. The pt processed ways by which to relate to the speaker. [place] speaker provided handouts and educational information pertaining to groups and services offered by the [facility]. Pt was engaged in speaker's presentation and was receptive to resources provided.   Stephanie Acre, MSW, [facility] 11/28/2018 1:55 PM

## 2018-11-28 NOTE — Progress Notes (Signed)
[facility] MD Progress Note  11/28/2018 11:13 AM [name] [name]  MRN:  [id number] Subjective:   Generally cordial no thoughts of self-harm seeking rehab no acute withdrawal but does have some hypertension does not believe it to be baseline we will add Catapres Principal Problem: "Homeless him/probable homelessness probably not welcome back with family according to patient Diagnosis: Active Problems:   MDD (major depressive disorder), recurrent severe, without psychosis ([place])  Total Time spent with patient: 20 minutes Past Medical History:  Past Medical History:  Diagnosis Date  . Abnormal ECG    a. early repolarization  . Arthritis    "my whole left side" (05/03/2014)  . Bipolar disorder ([place])   . Bradycardia    a. asymptomatic  . CAD in native artery    a. Nonobstructive cath 11/2007;  b. Presented with ST elevation - Nonobstructive cath 08/2011  . Chest pain, mid sternal   . Coronary artery disease   . GERD (gastroesophageal reflux disease)   . History of cocaine abuse ([place])    a. quit ? 2009  . History of ETOH abuse    a. drinks 2 "40's" / wk  . Marijuana abuse    a. uses ~ 1x /wk or less  . Pneumonia   . Stomach ulcer   . Syncope    a. 12/2010 - presumed to be vasovagal  . Tobacco abuse     Past Surgical History:  Procedure Laterality Date  . CARDIAC CATHETERIZATION  2009; 08/2011   [name] 08/06/2011  . LEFT HEART CATHETERIZATION WITH CORONARY ANGIOGRAM N/A 08/16/2011   Procedure: LEFT HEART CATHETERIZATION WITH CORONARY ANGIOGRAM;  Surgeon: [name], MD;  Location: [facility] CATH LAB;  Service: Cardiovascular;  Laterality: N/A;   Family History: History reviewed. No pertinent family history. Family Psychiatric  History: see eval Social History:  Social History   Substance and Sexual Activity  Alcohol Use Yes  . Alcohol/week: 48.0 standard drinks  . Types: 48 Cans of beer per week   Comment: 2 40 oz beers per day     Social History   Substance and Sexual Activity   Drug Use Yes  . Types: Marijuana, Cocaine    Social History   Socioeconomic History  . Marital status: Single    Spouse name: Not on file  . Number of children: Not on file  . Years of education: Not on file  . Highest education level: Not on file  Occupational History  . Not on file  Social Needs  . Financial resource strain: Not on file  . Food insecurity    Worry: Not on file    Inability: Not on file  . Transportation needs    Medical: Not on file    Non-medical: Not on file  Tobacco Use  . Smoking status: Current Some Day Smoker    Packs/day: 0.50    Years: 20.00    Pack years: 10.00    Types: Cigarettes    Last attempt to quit: 06/24/2015    Years since quitting: 3.4  . Smokeless tobacco: Never Used  Substance and Sexual Activity  . Alcohol use: Yes    Alcohol/week: 48.0 standard drinks    Types: 48 Cans of beer per week    Comment: 2 40 oz beers per day  . Drug use: Yes    Types: Marijuana, Cocaine  . Sexual activity: Yes  Lifestyle  . Physical activity    Days per week: Not on file    Minutes per  session: Not on file  . Stress: Not on file  Relationships  . Social Herbalist on phone: Not on file    Gets together: Not on file    Attends religious service: Not on file    Active member of club or organization: Not on file    Attends meetings of clubs or organizations: Not on file    Relationship status: Not on file  Other Topics Concern  . Not on file  Social History Narrative   Lives with his daughter currently and receives SSI.    Additional Social History:                         Sleep: Fair  Appetite:  Good  Current Medications: Current Facility-Administered Medications  Medication Dose Route Frequency Provider Last Rate Last Dose  . acamprosate (CAMPRAL) tablet 333 mg  333 mg Oral TID Sharma Covert, MD   333 mg at 11/28/18 0747  . alum & mag hydroxide-simeth (MAALOX/MYLANTA) 200-200-20 MG/5ML suspension 30 mL  30  mL Oral Q4H PRN Sharma Covert, MD      . chlordiazePOXIDE (LIBRIUM) capsule 10 mg  10 mg Oral TID [name] Hai, MD   10 mg at 11/28/18 0747  . chlordiazePOXIDE (LIBRIUM) capsule 25 mg  25 mg Oral QID PRN Sharma Covert, MD      . cloNIDine (CATAPRES) tablet 0.2 mg  0.2 mg Oral BID [name] Hai, MD      . escitalopram (LEXAPRO) tablet 10 mg  10 mg Oral Daily Sharma Covert, MD   10 mg at 11/28/18 0747  . feeding supplement (ENSURE ENLIVE) (ENSURE ENLIVE) liquid 237 mL  237 mL Oral BID BM Sharma Covert, MD      . folic acid (FOLVITE) tablet 1 mg  1 mg Oral Daily Sharma Covert, MD   1 mg at 11/28/18 0747  . gabapentin (NEURONTIN) capsule 300 mg  300 mg Oral BID Sharma Covert, MD   300 mg at 11/28/18 0747  . hydrOXYzine (ATARAX/VISTARIL) tablet 25 mg  25 mg Oral Q6H PRN Sharma Covert, MD      . ibuprofen (ADVIL) tablet 600 mg  600 mg Oral Q6H PRN Lindon Romp A, NP   600 mg at 11/28/18 1607  . magnesium hydroxide (MILK OF MAGNESIA) suspension 30 mL  30 mL Oral Daily PRN Sharma Covert, MD      . pantoprazole (PROTONIX) EC tablet 40 mg  40 mg Oral Daily Sharma Covert, MD   40 mg at 11/28/18 0747  . pneumococcal 23 valent vaccine (PNU-IMMUNE) injection 0.5 mL  0.5 mL Intramuscular Tomorrow-1000 Sharma Covert, MD      . thiamine (VITAMIN B-1) tablet 100 mg  100 mg Oral Daily Sharma Covert, MD   100 mg at 11/28/18 3710   Or  . thiamine (B-1) injection 100 mg  100 mg Intravenous Daily Sharma Covert, MD      . traZODone (DESYREL) tablet 50 mg  50 mg Oral QHS PRN Sharma Covert, MD   50 mg at 11/27/18 2214    Lab Results:  Results for orders placed or performed during the hospital encounter of 11/26/18 (from the past 48 hour(s))  SARS Coronavirus 2 (CEPHEID - Performed in [place] hospital lab), Hosp Order     Status: None   Collection Time: 11/26/18 11:30 AM   Specimen: Nasopharyngeal Swab  Result  Value Ref Range   SARS Coronavirus 2  NEGATIVE NEGATIVE    Comment: (NOTE) If result is NEGATIVE SARS-CoV-2 target nucleic acids are NOT DETECTED. The SARS-CoV-2 RNA is generally detectable in upper and lower  respiratory specimens during the acute phase of infection. The lowest  concentration of SARS-CoV-2 viral copies this assay can detect is 250  copies / mL. A negative result does not preclude SARS-CoV-2 infection  and should not be used as the sole basis for treatment or other  patient management decisions.  A negative result may occur with  improper specimen collection / handling, submission of specimen other  than nasopharyngeal swab, presence of viral mutation(s) within the  areas targeted by this assay, and inadequate number of viral copies  (<250 copies / mL). A negative result must be combined with clinical  observations, patient history, and epidemiological information. If result is POSITIVE SARS-CoV-2 target nucleic acids are DETECTED. The SARS-CoV-2 RNA is generally detectable in upper and lower  respiratory specimens dur ing the acute phase of infection.  Positive  results are indicative of active infection with SARS-CoV-2.  Clinical  correlation with patient history and other diagnostic information is  necessary to determine patient infection status.  Positive results do  not rule out bacterial infection or co-infection with other viruses. If result is PRESUMPTIVE POSTIVE SARS-CoV-2 nucleic acids MAY BE PRESENT.   A presumptive positive result was obtained on the submitted specimen  and confirmed on repeat testing.  While 2019 novel coronavirus  (SARS-CoV-2) nucleic acids may be present in the submitted sample  additional confirmatory testing may be necessary for epidemiological  and / or clinical management purposes  to differentiate between  SARS-CoV-2 and other Sarbecovirus currently known to infect humans.  If clinically indicated additional testing with an alternate test  methodology [phone]) is  advised. The SARS-CoV-2 RNA is generally  detectable in upper and lower respiratory sp ecimens during the acute  phase of infection. The expected result is Negative. Fact Sheet for Patients:  StrictlyIdeas.no Fact Sheet for Healthcare Providers: BankingDealers.co.za This test is not yet approved or cleared by the [place] FDA and has been authorized for detection and/or diagnosis of SARS-CoV-2 by FDA under an Emergency Use Authorization (EUA).  This EUA will remain in effect (meaning this test can be used) for the duration of the COVID-19 declaration under Section 564(b)(1) of the Act, 21 U.S.C. section 360bbb-3(b)(1), unless the authorization is terminated or revoked sooner. Performed at [facility], [place] [address] [place], [place] [address]     Blood Alcohol level:  Lab Results  Component Value Date   ETH 198 (H) 11/26/2018   ETH 110 (H) 32/99/2426    Metabolic Disorder Labs: Lab Results  Component Value Date   HGBA1C 4.9 05/04/2016   MPG 94 05/04/2016   MPG 82 05/03/2014   No results found for: PROLACTIN Lab Results  Component Value Date   CHOL 170 05/04/2016   TRIG 155 (H) 05/04/2016   HDL 65 05/04/2016   CHOLHDL 2.6 05/04/2016   VLDL 31 05/04/2016   LDLCALC 74 05/04/2016   LDLCALC 42 05/03/2014    Physical Findings: AIMS: Facial and Oral Movements Muscles of Facial Expression: None, normal Lips and Perioral Area: None, normal Jaw: None, normal Tongue: None, normal,Extremity Movements Upper (arms, wrists, hands, fingers): None, normal Lower (legs, knees, ankles, toes): None, normal, Trunk Movements Neck, shoulders, hips: None, normal, Overall Severity Severity of abnormal movements (highest score from questions above): None, normal  Incapacitation due to abnormal movements: None, normal Patient's awareness of abnormal movements (rate only patient's report): No Awareness, Dental  Status Current problems with teeth and/or dentures?: Yes Does patient usually wear dentures?: No  CIWA:  CIWA-Ar Total: 2 COWS:  COWS Total Score: 1  Musculoskeletal: Strength & Muscle Tone: within normal limits Gait & Station: normal Patient leans: N/A  Psychiatric Specialty Exam: Physical Exam  ROS  Blood pressure (!) 132/99, pulse 63, temperature 98 F (36.7 C), temperature source Oral, resp. rate 16, height 5\' 4"  (1.626 m), weight 51.3 kg, SpO2 98 %.Body mass index is 19.4 kg/m.  General Appearance: Casual  Eye Contact:  Good  Speech:  Clear and Coherent  Volume:  Normal  Mood:  Euthymic  Affect:  Congruent  Thought Process:  Coherent and Descriptions of Associations: Intact  Orientation:  Full (Time, Place, and Person)  Thought Content:  Logical  Suicidal Thoughts:  No  Homicidal Thoughts:  No  Memory:  Immediate;   Good  Judgement:  Good  Insight:  Good  Psychomotor Activity:  Normal  Concentration:  Concentration: Good  Recall:  Good  Fund of Knowledge:  Good  Language:  nl  Akathisia:  Negative  Handed:  Right  AIMS (if indicated):     Assets:  Physical Health Resilience  ADL's:  Intact  Cognition:  WNL  Sleep:  Number of Hours: 6.25     Treatment Plan Summary: Daily contact with patient to assess and evaluate symptoms and progress in treatment and Medication management continue cognitive and rehab therapies continue to seek rehab options as an outpatient continue detox protocol add Catapres for blood pressure Tareva Leske, MD 11/28/2018, 11:13 AM

## 2018-11-29 NOTE — Care Management (Signed)
CMA sent referral to [place] will follow up.    Audreena Sachdeva Care Management Assistant  Email:[email or web address] Office: [phone]

## 2018-11-29 NOTE — Progress Notes (Signed)
Patient approached medication window anxious, distressed, and tearful. Patient states if he is discharged today, he will walk down the street, go on the bridge, and jump to complete suicide. He said "I'm not playing with you guys. What's the point of me being here if you guys aren't helping."

## 2018-11-29 NOTE — Progress Notes (Signed)
Patient spoke with CSW on unit in regard to a potential discharge. He is primarily concerned with housing. He has expressed interest in residential substance use treatment and he has been referred, it has been explained to him that unfortunately due to [place], it has taken a longer period of time to get accepted into a residential program.  Patient inquired about an ACTT referral a second time ([facility]), CSW explained that patient does not meet ACTT criteria in regard to the number of behavioral health hospitalizations or diagnosis. Patient voices understanding, but would still like referral information faxed over.  Patient states that if he is discharged today he will leave all of his possessions at [facility] and will "go out and end it all." Patient has made similar statements to other staff members.  Stephanie Acre, LCSW-A Clinical Social Worker

## 2018-11-29 NOTE — Progress Notes (Signed)
Adult Psychoeducational Group Note  Date:  11/29/2018 Time:  10:20 PM  Group Topic/Focus:  Wrap-Up Group:   The focus of this group is to help patients review their daily goal of treatment and discuss progress on daily workbooks.  Participation Level:  Active  Participation Quality:  Appropriate  Affect:  Appropriate  Cognitive:  Appropriate  Insight: Appropriate  Engagement in Group:  Engaged  Modes of Intervention:  Discussion  Additional Comments:  Patient was very frustrated with the Education officer, museum and provider here at [facility], because he felt he wasn't getting the necessary help to find placement.  Patient says if discharged tomorrow, he will carry out his SI plan. Tech notified the nurse.   [name] [name] W Harriet Bollen 03/01/9323, 10:20 PM

## 2018-11-29 NOTE — Progress Notes (Signed)
Patient has been assigned a [facility], [place] [phone]). CSW followed up with care coordinator and informed her of patient's expected discharge tomorrow and follow up appointment with [facility] [place].   [facility] Transitional Care Team (TCT) also called about patient. CSW will provide patient with Denise's contact information if he wishes to follow up.   Stephanie Acre, LCSW-A Clinical Social Worker

## 2018-11-29 NOTE — Plan of Care (Signed)
  Problem: Physical Regulation: Goal: Complications related to the disease process, condition or treatment will be avoided or minimized Outcome: Progressing   Problem: Safety: Goal: Ability to remain free from injury will improve Outcome: Progressing   Problem: Education: Goal: Knowledge of [facility] Education information/materials will improve Outcome: Progressing Goal: Emotional status will improve Outcome: Progressing

## 2018-11-29 NOTE — Progress Notes (Signed)
[facility] MD Progress Note  11/29/2018 11:46 AM [name] [name]  MRN:  [id number] Subjective:   Patient giving contradictory information on my interview is alert oriented hopeful to find rehab but denied suicidal thoughts homicidal thoughts denied acute psychosis and withdrawal symptoms can contract fully, however reportedly told nurses that he did have suicidal thoughts and plans but could contract here.  Issues of secondary gain prominent Principal Problem: Depressive symptoms alcoholism Diagnosis: Active Problems:   MDD (major depressive disorder), recurrent severe, without psychosis ([place])  Total Time spent with patient: 20 minutes  Past Medical History:  Past Medical History:  Diagnosis Date  . Abnormal ECG    a. early repolarization  . Arthritis    "my whole left side" (05/03/2014)  . Bipolar disorder ([place])   . Bradycardia    a. asymptomatic  . CAD in native artery    a. Nonobstructive cath 11/2007;  b. Presented with ST elevation - Nonobstructive cath 08/2011  . Chest pain, mid sternal   . Coronary artery disease   . GERD (gastroesophageal reflux disease)   . History of cocaine abuse ([place])    a. quit ? 2009  . History of ETOH abuse    a. drinks 2 "40's" / wk  . Marijuana abuse    a. uses ~ 1x /wk or less  . Pneumonia   . Stomach ulcer   . Syncope    a. 12/2010 - presumed to be vasovagal  . Tobacco abuse     Past Surgical History:  Procedure Laterality Date  . CARDIAC CATHETERIZATION  2009; 08/2011   [name] 08/06/2011  . LEFT HEART CATHETERIZATION WITH CORONARY ANGIOGRAM N/A 08/16/2011   Procedure: LEFT HEART CATHETERIZATION WITH CORONARY ANGIOGRAM;  Surgeon: [name], MD;  Location: [facility] CATH LAB;  Service: Cardiovascular;  Laterality: N/A;   Family History: History reviewed. No pertinent family history. Family Psychiatric  History: see eval Social History:  Social History   Substance and Sexual Activity  Alcohol Use Yes  . Alcohol/week: 48.0 standard drinks  .  Types: 48 Cans of beer per week   Comment: 2 40 oz beers per day     Social History   Substance and Sexual Activity  Drug Use Yes  . Types: Marijuana, Cocaine    Social History   Socioeconomic History  . Marital status: Single    Spouse name: Not on file  . Number of children: Not on file  . Years of education: Not on file  . Highest education level: Not on file  Occupational History  . Not on file  Social Needs  . Financial resource strain: Not on file  . Food insecurity    Worry: Not on file    Inability: Not on file  . Transportation needs    Medical: Not on file    Non-medical: Not on file  Tobacco Use  . Smoking status: Current Some Day Smoker    Packs/day: 0.50    Years: 20.00    Pack years: 10.00    Types: Cigarettes    Last attempt to quit: 06/24/2015    Years since quitting: 3.4  . Smokeless tobacco: Never Used  Substance and Sexual Activity  . Alcohol use: Yes    Alcohol/week: 48.0 standard drinks    Types: 48 Cans of beer per week    Comment: 2 40 oz beers per day  . Drug use: Yes    Types: Marijuana, Cocaine  . Sexual activity: Yes  Lifestyle  .  Physical activity    Days per week: Not on file    Minutes per session: Not on file  . Stress: Not on file  Relationships  . Social Herbalist on phone: Not on file    Gets together: Not on file    Attends religious service: Not on file    Active member of club or organization: Not on file    Attends meetings of clubs or organizations: Not on file    Relationship status: Not on file  Other Topics Concern  . Not on file  Social History Narrative   Lives with his daughter currently and receives SSI.    Additional Social History:                         Sleep: Good  Appetite:  Good  Current Medications: Current Facility-Administered Medications  Medication Dose Route Frequency Provider Last Rate Last Dose  . acamprosate (CAMPRAL) tablet 333 mg  333 mg Oral TID Sharma Covert, MD   333 mg at 11/29/18 4270  . alum & mag hydroxide-simeth (MAALOX/MYLANTA) 200-200-20 MG/5ML suspension 30 mL  30 mL Oral Q4H PRN Sharma Covert, MD      . chlordiazePOXIDE (LIBRIUM) capsule 25 mg  25 mg Oral QID PRN Sharma Covert, MD      . cloNIDine (CATAPRES) tablet 0.2 mg  0.2 mg Oral BID [name] Hai, MD   0.2 mg at 11/28/18 1712  . escitalopram (LEXAPRO) tablet 10 mg  10 mg Oral Daily Sharma Covert, MD   10 mg at 11/29/18 6237  . feeding supplement (ENSURE ENLIVE) (ENSURE ENLIVE) liquid 237 mL  237 mL Oral BID BM Sharma Covert, MD   237 mL at 11/28/18 1554  . folic acid (FOLVITE) tablet 1 mg  1 mg Oral Daily Sharma Covert, MD   1 mg at 11/29/18 6283  . gabapentin (NEURONTIN) capsule 300 mg  300 mg Oral BID Sharma Covert, MD   300 mg at 11/29/18 1517  . hydrOXYzine (ATARAX/VISTARIL) tablet 25 mg  25 mg Oral Q6H PRN Sharma Covert, MD      . ibuprofen (ADVIL) tablet 600 mg  600 mg Oral Q6H PRN Lindon Romp A, NP   600 mg at 11/29/18 0811  . magnesium hydroxide (MILK OF MAGNESIA) suspension 30 mL  30 mL Oral Daily PRN Sharma Covert, MD      . pantoprazole (PROTONIX) EC tablet 40 mg  40 mg Oral Daily Sharma Covert, MD   40 mg at 11/29/18 6160  . pneumococcal 23 valent vaccine (PNU-IMMUNE) injection 0.5 mL  0.5 mL Intramuscular Tomorrow-1000 Sharma Covert, MD      . thiamine (VITAMIN B-1) tablet 100 mg  100 mg Oral Daily Sharma Covert, MD   100 mg at 11/29/18 7371   Or  . thiamine (B-1) injection 100 mg  100 mg Intravenous Daily Sharma Covert, MD      . traZODone (DESYREL) tablet 50 mg  50 mg Oral QHS PRN Sharma Covert, MD   50 mg at 11/28/18 2157    Lab Results: No results found for this or any previous visit (from the past 70 hour(s)).  Blood Alcohol level:  Lab Results  Component Value Date   ETH 198 (H) 11/26/2018   ETH 110 (H) 12/01/9483    Metabolic Disorder Labs: Lab Results  Component Value Date   HGBA1C  4.9 05/04/2016   MPG 94 05/04/2016   MPG 82 05/03/2014   No results found for: PROLACTIN Lab Results  Component Value Date   CHOL 170 05/04/2016   TRIG 155 (H) 05/04/2016   HDL 65 05/04/2016   CHOLHDL 2.6 05/04/2016   VLDL 31 05/04/2016   LDLCALC 74 05/04/2016   LDLCALC 42 05/03/2014    Physical Findings: AIMS: Facial and Oral Movements Muscles of Facial Expression: None, normal Lips and Perioral Area: None, normal Jaw: None, normal Tongue: None, normal,Extremity Movements Upper (arms, wrists, hands, fingers): None, normal Lower (legs, knees, ankles, toes): None, normal, Trunk Movements Neck, shoulders, hips: None, normal, Overall Severity Severity of abnormal movements (highest score from questions above): None, normal Incapacitation due to abnormal movements: None, normal Patient's awareness of abnormal movements (rate only patient's report): No Awareness, Dental Status Current problems with teeth and/or dentures?: Yes Does patient usually wear dentures?: No  CIWA:  CIWA-Ar Total: 0 COWS:  COWS Total Score: 1  Musculoskeletal: Strength & Muscle Tone: within normal limits Gait & Station: normal Patient leans: N/A  Psychiatric Specialty Exam: Physical Exam  ROS  Blood pressure (!) 88/65, pulse (!) 50, temperature 97.6 F (36.4 C), temperature source Oral, resp. rate 16, height 5\' 4"  (1.626 m), weight 51.3 kg, SpO2 99 %.Body mass index is 19.4 kg/m.  General Appearance: Casual  Eye Contact:  Good  Speech:  Clear and Coherent  Volume:  Normal  Mood:  Euthymic  Affect:  Constricted  Thought Process:  Coherent and Descriptions of Associations: Tangential  Orientation:  Full (Time, Place, and Person)  Thought Content:  Logical  Suicidal Thoughts:  No  Homicidal Thoughts:  No  Memory:  Immediate;   Fair  Judgement:  Intact  Insight:  Good  Psychomotor Activity:  Normal  Concentration:  Concentration: Fair  Recall:  [employer] of Knowledge:  Fair  Language:   Good  Akathisia:  Negative  Handed:  Right  AIMS (if indicated):     Assets:  Resilience Social Support Talents/Skills  ADL's:  Intact  Cognition:  WNL  Sleep:  Number of Hours: 6.75     Treatment Plan Summary: Daily contact with patient to assess and evaluate symptoms and progress in treatment and Medication management continue detox measures continue to monitor for withdrawal continue antidepressant therapy cognitive therapy probable discharge tomorrow  [name] Hai, MD 11/29/2018, 11:46 AM

## 2018-11-29 NOTE — Care Management (Addendum)
Patient has been accepted to [facility]. At this time, there are no beds available. Patient is on the waiting list.  June with Admissions will contact patient when a bed becomes available. Patient can also follow up with facility after discharge.   CMA will notify [place], Baldo Ash.

## 2018-11-30 MED ORDER — GABAPENTIN 300 MG PO CAPS: 300.0000 mg | ORAL_CAPSULE | Freq: Two times a day (BID) | ORAL | 1 refills | Status: DC

## 2018-11-30 MED ORDER — GABAPENTIN 300 MG PO CAPS: 300.0000 mg | ORAL_CAPSULE | Freq: Two times a day (BID) | ORAL | 0 refills | Status: DC

## 2018-11-30 NOTE — Plan of Care (Signed)
  Problem: Education: Goal: Knowledge of disease or condition will improve Outcome: Adequate for Discharge   Problem: Education: Goal: Understanding of discharge needs will improve Outcome: Adequate for Discharge   Problem: Health Behavior/Discharge Planning: Goal: Ability to identify changes in lifestyle to reduce recurrence of condition will improve Outcome: Adequate for Discharge   Problem: Health Behavior/Discharge Planning: Goal: Identification of resources available to assist in meeting health care needs will improve Outcome: Adequate for Discharge   Problem: Physical Regulation: Goal: Complications related to the disease process, condition or treatment will be avoided or minimized Outcome: Adequate for Discharge

## 2018-11-30 NOTE — Progress Notes (Signed)
  [facility] Adult Case Management Discharge Plan :  Will you be returning to the same living situation after discharge:  No. Provided shelter resources  At discharge, do you have transportation home?: Yes,  bus is free Do you have the ability to pay for your medications: Yes,  Medicaid and SSI.   Release of information consent forms completed and in the chart.  Patient to Follow up at: Follow-up Information    Services, [facility] Follow up.   Why: You have been accepted for treatment. At this time, there are currently no beds. You have been placed on the waiting list. June with admissions will contact you. You can also call and speak with June for availibilty.  Contact information: [address] [place] [address] [place] Follow up on 12/06/2018.   Specialty: Professional Counselor Why: Please attend your intake appointment for services on Wednesday, 7/1 at 12:00p.  Please bring your photo ID, insurance card, SSN, and discharge paperwork from this hospitalization.  Contact information: [facility] [place] [address] [phone]           Next level of care provider has access to [place] and Suicide Prevention discussed: Yes,  with patient.   Has patient been referred to the Quitline?: Patient refused referral  Patient has been referred for addiction treatment: Yes  Joellen Jersey, [place] 11/30/2018, 9:20 AM

## 2018-11-30 NOTE — Progress Notes (Signed)
D:  Patient reports that he did not have a good and is upset that he is being discharged tomorrow.  He states that [facility] has done nothing for him and that he will go to a bridge and jump off if discharged tomorrow.  He can contract for safety while here, but states that he will no be ok if he is discharged tomorrow.  He is visible in the milieu and laughs and interacts with peers. A:  Medications administered as ordered.  Safety checks q 15 minutes.  Emotional support provided.  R:  Safety maintained on unit.   [place] NOVEL CORONAVIRUS (COVID-19) DAILY CHECK-OFF SYMPTOMS - answer yes or no to each - every day NO YES  Have you had a fever in the past 24 hours?  . Fever (Temp > 37.80C / 100F) X   Have you had any of these symptoms in the past 24 hours? . New Cough .  Sore Throat  .  Shortness of Breath .  Difficulty Breathing .  Unexplained Body Aches   X   Have you had any one of these symptoms in the past 24 hours not related to allergies?   . Runny Nose .  Nasal Congestion .  Sneezing   X   If you have had runny nose, nasal congestion, sneezing in the past 24 hours, has it worsened?  X   EXPOSURES - check yes or no X   Have you traveled outside the state in the past 14 days?  X   Have you been in contact with someone with a confirmed diagnosis of COVID-19 or PUI in the past 14 days without wearing appropriate PPE?  X   Have you been living in the same home as a person with confirmed diagnosis of COVID-19 or a PUI (household contact)?    X   Have you been diagnosed with COVID-19?    X              What to do next: Answered NO to all: Answered YES to anything:   Proceed with unit schedule Follow the [facility] Inpatient Flowsheet.

## 2018-11-30 NOTE — [facility] Suicide Risk Assessment (Signed)
[facility] Discharge Suicide Risk Assessment   Principal Problem: Alcoholism/complaints of depressive symptoms/issues of secondary gain Discharge Diagnoses: Active Problems:   MDD (major depressive disorder), recurrent severe, without psychosis ([place])   Total Time spent with patient: 45 minutes  Musculoskeletal: Strength & Muscle Tone: within normal limits Gait & Station: normal Patient leans: N/A  Psychiatric Specialty Exam: ROS  Blood pressure 96/68, pulse 61, temperature 97.7 F (36.5 C), temperature source Oral, resp. rate 16, height 5\' 4"  (1.626 m), weight 51.3 kg, SpO2 96 %.Body mass index is 19.4 kg/m.  General Appearance: Casual  Eye Contact::  Good  Speech:  Clear and Coherent409  Volume:  Normal  Mood:  Euthymic-engaged and jovial  Affect:  Full Range  Thought Process:  Coherent and Descriptions of Associations: Intact  Orientation:  Full (Time, Place, and Person)  Thought Content:  Logical  Suicidal Thoughts:  No  Homicidal Thoughts:  No  Memory:  Recent;   Fair  Judgement:  Fair  Insight:  Fair  Psychomotor Activity:  Normal  Concentration:  Good  Recall: nl  Fund of Knowledge:nl  Language: Good  Akathisia:  Negative  Handed:  Right  AIMS (if indicated):     Assets:  Communication Skills Desire for Improvement  Sleep:  Number of Hours: 6.75  Cognition: WNL  ADL's:  Intact   Mental Status Per Nursing Assessment::   On Admission:  NA  Demographic Factors:  Male, Living alone and Unemployed  Loss Factors: Decrease in vocational status  Historical Factors: NA  Risk Reduction Factors:   Religious beliefs about death  Continued Clinical Symptoms:  Alcohol/Substance Abuse/Dependencies  Cognitive Features That Contribute To Risk:  None    Suicide Risk:  Minimal: No identifiable suicidal ideation.  Patients presenting with no risk factors but with morbid ruminations; may be classified as minimal risk based on the severity of the depressive  symptoms  Follow-up Information    [facility] Follow up.   Why: You have been accepted for treatment. At this time, there are currently no beds. You have been placed on the waiting list. June with admissions will contact you. You can also call and speak with June for availibilty.  Contact information: [facility] [place] [address] [place] Follow up on 12/06/2018.   Specialty: Professional Counselor Why: Please attend your intake appointment for services on Wednesday, 7/1 at 12:00p.  Please bring your photo ID, insurance card, SSN, and discharge paperwork from this hospitalization.  Contact information: [facility] [place] [address] [phone]        [facility] Follow up.   Contact information: [address] [place] [address] [phone]         Patient understands there are limited resources for rehab given the pandemic Plan Of Care/Follow-up recommendations:  Activity:  full  Soo Steelman, MD 11/30/2018, 8:08 AM

## 2018-11-30 NOTE — Progress Notes (Signed)
Discharge note: Patient reviewed discharge paperwork with RN including prescriptions, follow up appointments, and lab work. Patient given the opportunity to ask questions. All concerns were addressed. All belongings were returned to patient. Denied SI/HI/AVH. Patient thanked staff for their care while at the hospital.  Patient was discharged to lobby where his dad was waiting to pick him up.
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
## 2019-01-23 ENCOUNTER — Encounter (HOSPITAL_COMMUNITY): Payer: Self-pay | Admitting: Emergency Medicine

## 2019-01-23 ENCOUNTER — Emergency Department (HOSPITAL_COMMUNITY)
Admission: EM | Admit: 2019-01-23 | Discharge: 2019-01-23 | Disposition: A | Discharge status: Home / Self Care | Payer: Medicaid Other | Attending: Emergency Medicine | Admitting: Emergency Medicine

## 2019-01-23 ENCOUNTER — Other Ambulatory Visit: Payer: Self-pay

## 2019-01-23 ENCOUNTER — Emergency Department (HOSPITAL_COMMUNITY): Payer: Medicaid Other

## 2019-01-23 DIAGNOSIS — R1013 Epigastric pain: Secondary | ICD-10-CM | POA: Diagnosis not present

## 2019-01-23 DIAGNOSIS — Z79899 Other long term (current) drug therapy: Secondary | ICD-10-CM | POA: Diagnosis not present

## 2019-01-23 DIAGNOSIS — I2510 Atherosclerotic heart disease of native coronary artery without angina pectoris: Secondary | ICD-10-CM | POA: Insufficient documentation

## 2019-01-23 DIAGNOSIS — F17210 Nicotine dependence, cigarettes, uncomplicated: Secondary | ICD-10-CM | POA: Diagnosis not present

## 2019-01-23 DIAGNOSIS — K219 Gastro-esophageal reflux disease without esophagitis: Secondary | ICD-10-CM | POA: Diagnosis not present

## 2019-01-23 DIAGNOSIS — R0789 Other chest pain: Secondary | ICD-10-CM | POA: Diagnosis present

## 2019-01-23 LAB — CBC
HCT: 39 % (ref 39.0–52.0)
Hemoglobin: 13.6 g/dL (ref 13.0–17.0)
MCH: 32.1 pg (ref 26.0–34.0)
MCHC: 34.9 g/dL (ref 30.0–36.0)
MCV: 92 fL (ref 80.0–100.0)
Platelets: 238 10*3/uL (ref 150–400)
RBC: 4.24 MIL/uL (ref 4.22–5.81)
RDW: 13.4 % (ref 11.5–15.5)
WBC: 4.9 10*3/uL (ref 4.0–10.5)
nRBC: 0 % (ref 0.0–0.2)

## 2019-01-23 LAB — BASIC METABOLIC PANEL
Anion gap: 12 (ref 5–15)
BUN: 11 mg/dL (ref 6–20)
CO2: 22 mmol/L (ref 22–32)
Calcium: 8.8 mg/dL — ABNORMAL LOW (ref 8.9–10.3)
Chloride: 105 mmol/L (ref 98–111)
Creatinine, Ser: 1.28 mg/dL — ABNORMAL HIGH (ref 0.61–1.24)
GFR calc Af Amer: >60 mL/min (ref >60)
GFR calc non Af Amer: >60 mL/min (ref >60)
Glucose, Bld: 90 mg/dL (ref 70–99)
Potassium: 3.6 mmol/L (ref 3.5–5.1)
Sodium: 139 mmol/L (ref 135–145)

## 2019-01-23 LAB — TROPONIN I (HIGH SENSITIVITY)
Troponin I (High Sensitivity): 3 ng/L (ref <18)
Troponin I (High Sensitivity): 3 ng/L (ref <18)

## 2019-01-23 MED ORDER — SUCRALFATE 1 G PO TABS: 1.0000 g | ORAL_TABLET | Freq: Three times a day (TID) | ORAL | 0 refills | Status: DC

## 2019-01-23 MED ORDER — SODIUM CHLORIDE 0.9% FLUSH: 3.0000 mL | Freq: Once | INTRAVENOUS | Status: DC

## 2019-01-23 MED ORDER — ALUM & MAG HYDROXIDE-SIMETH 200-200-20 MG/5ML PO SUSP
30.0000 mL | Freq: Once | ORAL | Status: AC
  Administered 2019-01-23: 30 mL via ORAL
  Filled 2019-01-23: qty 30

## 2019-01-23 MED ORDER — LIDOCAINE VISCOUS HCL 2 % MT SOLN
15.0000 mL | Freq: Once | OROMUCOSAL | Status: AC
  Administered 2019-01-23: 15 mL via ORAL
  Filled 2019-01-23: qty 15

## 2019-01-23 NOTE — ED Provider Notes (Signed)
[facility] EMERGENCY DEPARTMENT Provider Note   CSN: [id number] Arrival date & time: 01/23/19  3016     History   Chief Complaint Chief Complaint  Patient presents with  . Chest Pain    HPI [name] [name] is a 53 y.o. male.     Patient presents to the emergency department with a chief complaint of chest pain.  He reports having the pain for the past 3 days.  He states pain is been constant.  He describes it as a burning sensation in his chest.  He states that it felt like acid reflux, but when it worsened today he decided to come for evaluation.  He denies any shortness of breath.  He reports intermittent diaphoresis.  Denies any radiating pain.  He has not taken anything specific for his symptoms except for drinking hot coffee.  Denies any fever or chills.  States that he has had some dry cough.  The history is provided by the patient. No language interpreter was used.    Past Medical History:  Diagnosis Date  . Abnormal ECG    a. early repolarization  . Arthritis    "my whole left side" (05/03/2014)  . Bipolar disorder ([place])   . Bradycardia    a. asymptomatic  . CAD in native artery    a. Nonobstructive cath 11/2007;  b. Presented with ST elevation - Nonobstructive cath 08/2011  . Chest pain, mid sternal   . Coronary artery disease   . GERD (gastroesophageal reflux disease)   . History of cocaine abuse ([place])    a. quit ? 2009  . History of ETOH abuse    a. drinks 2 "40's" / wk  . Marijuana abuse    a. uses ~ 1x /wk or less  . Pneumonia   . Stomach ulcer   . Syncope    a. 12/2010 - presumed to be vasovagal  . Tobacco abuse     Patient Active Problem List   Diagnosis Date Noted  . MDD (major depressive disorder), recurrent severe, without psychosis ([place]) 02/08/2018  . Major depressive disorder, recurrent episode, severe with anxious distress ([place]) 05/23/2016  . PTSD (post-traumatic stress disorder) 05/23/2016  . Cocaine use disorder, moderate,  in early remission ([place]) 05/23/2016  . Alcohol use disorder, moderate, dependence ([place]) 05/23/2016  . Affective psychosis, bipolar ([place]) 04/30/2016  . Cocaine abuse with cocaine-induced mood disorder ([place]) 03/04/2016  . Alcohol dependence with uncomplicated withdrawal ([place])   . Alcohol use disorder, severe, dependence ([place]) 01/18/2015  . Alcohol dependence with alcohol-induced mood disorder ([place])   . Suicidal ideation   . Acute pancreatitis 03/20/2012  . Polysubstance abuse ([place]) 03/20/2012  . Cannabis abuse 01/12/2012  . Alcohol dependence ([place]) 10/15/2011  . Abnormal ECG   . Chest pain   . CAD in native artery   . GERD (gastroesophageal reflux disease)   . Tobacco abuse   . History of cocaine abuse ([place])   . History of ETOH abuse   . Bradycardia   . Syncope     Past Surgical History:  Procedure Laterality Date  . CARDIAC CATHETERIZATION  2009; 08/2011   [name] 08/06/2011  . LEFT HEART CATHETERIZATION WITH CORONARY ANGIOGRAM N/A 08/16/2011   Procedure: LEFT HEART CATHETERIZATION WITH CORONARY ANGIOGRAM;  Surgeon: [name], MD;  Location: [facility] CATH LAB;  Service: Cardiovascular;  Laterality: N/A;        Home Medications    Prior to Admission medications   Medication Sig Start  Date End Date Taking? Authorizing Provider  acamprosate (CAMPRAL) 333 MG tablet Take 333 mg by mouth 3 (three) times daily. 08/28/18   [provider]  escitalopram (LEXAPRO) 5 MG tablet Take 5 mg by mouth daily. 08/28/18   [provider]  gabapentin (NEURONTIN) 300 MG capsule Take 1 capsule (300 mg total) by mouth 2 (two) times daily. 11/30/18   Connye Burkitt, NP  hydrOXYzine (VISTARIL) 25 MG capsule Take 25 mg by mouth every 6 (six) hours as needed for anxiety or vomiting.  08/28/18   [provider]  pantoprazole (PROTONIX) 20 MG tablet Take 20 mg by mouth daily. 08/28/18   [provider]  traZODone (DESYREL) 50 MG tablet Take 50 mg by mouth at bedtime as  needed for sleep.  08/28/18   [provider]    Family History No family history on file.  Social History Social History   Tobacco Use  . Smoking status: Current Some Day Smoker    Packs/day: 0.50    Years: 20.00    Pack years: 10.00    Types: Cigarettes    Last attempt to quit: 06/24/2015    Years since quitting: 3.5  . Smokeless tobacco: Never Used  Substance Use Topics  . Alcohol use: Yes    Alcohol/week: 48.0 standard drinks    Types: 48 Cans of beer per week    Comment: 2 40 oz beers per day  . Drug use: Yes    Types: Marijuana, Cocaine     Allergies   Aspirin, Pepperoni [pickled meat], Tomato, and Tylenol [acetaminophen]   Review of Systems Review of Systems  All other systems reviewed and are negative.    Physical Exam Updated Vital Signs BP 109/83 (BP Location: Right Arm)   Pulse (!) 55   Temp 97.7 F (36.5 C) (Oral)   Resp 15   Ht 5\' 4"  (1.626 m)   Wt 54.4 kg   SpO2 97%   BMI 20.60 kg/m   Physical Exam Vitals signs and nursing note reviewed.  Constitutional:      Appearance: He is well-developed.  HENT:     Head: Normocephalic and atraumatic.  Eyes:     Conjunctiva/sclera: Conjunctivae normal.  Neck:     Musculoskeletal: Neck supple.  Cardiovascular:     Rate and Rhythm: Normal rate and regular rhythm.     Heart sounds: No murmur.  Pulmonary:     Effort: Pulmonary effort is normal. No respiratory distress.     Breath sounds: Normal breath sounds.  Abdominal:     Palpations: Abdomen is soft.     Tenderness: There is abdominal tenderness.     Comments: Epigastric tenderness  Skin:    General: Skin is warm and dry.  Neurological:     Mental Status: He is alert.      ED Treatments / Results  Labs (all labs ordered are listed, but only abnormal results are displayed) Labs Reviewed  BASIC METABOLIC PANEL - Abnormal; Notable for the following components:      Result Value   Creatinine, Ser 1.28 (*)    Calcium 8.8 (*)     All other components within normal limits  CBC  TROPONIN I (HIGH SENSITIVITY)  TROPONIN I (HIGH SENSITIVITY)    EKG None ED ECG REPORT  I personally interpreted this EKG   Date: 01/23/2019   Rate: 51  Rhythm: sinus bradycardia  QRS Axis: normal  Intervals: normal  ST/T Wave abnormalities: nonspecific ST changes  Conduction  Disutrbances:none  Narrative Interpretation:   Old EKG Reviewed: none available   Radiology Dg Abd Acute 2+v W 1v Chest  Result Date: 01/23/2019 CLINICAL DATA:  Initial evaluation for acute epigastric abdominal pain. EXAM: DG ABDOMEN ACUTE W/ 1V CHEST COMPARISON:  Prior CT from 10/16/2014. FINDINGS: Cardiac and mediastinal silhouettes are within normal limits. Lungs normally inflated. No focal infiltrates. No edema or effusion. No pneumothorax. Azygos lobe noted. Bowel gas pattern within normal limits without obstruction or ileus. No abnormal bowel wall thickening. No free intraperitoneal air. No soft tissue mass or abnormal calcification. Visualized osseous structures demonstrate no acute finding. IMPRESSION: 1. Nonobstructive bowel gas pattern with no radiographic evidence for acute intra-abdominal process. 2. No active cardiopulmonary disease. Electronically Signed   By: Jeannine Boga M.D.   On: 01/23/2019 04:00    Procedures Procedures (including critical care time)  Medications Ordered in ED Medications  sodium chloride flush (NS) 0.9 % injection 3 mL (0 mLs Intravenous Hold 01/23/19 0333)  alum & mag hydroxide-simeth (MAALOX/MYLANTA) 200-200-20 MG/5ML suspension 30 mL (has no administration in time range)    And  lidocaine (XYLOCAINE) 2 % viscous mouth solution 15 mL (has no administration in time range)     Initial Impression / Assessment and Plan / ED Course  I have reviewed the triage vital signs and the nursing notes.  Pertinent labs & imaging results that were available during my care of the patient were reviewed by me and considered in my  medical decision making (see chart for details).        Patient with constant chest pain for the past 3 days.  Has known coronary artery disease.  Symptoms seem more consistent with acid reflux. Will check labs, EKG, and x-ray.  Will give GI cocktail and reassess.  Feels improved with GI cocktail.    No change in troponins.  Remains steady at 3.   No ischemic changes on EKG.  Symptoms seem more consistent with GERD/PUD.  Will discharge home with carafate and GI follow-up.    Final Clinical Impressions(s) / ED Diagnoses   Final diagnoses:  Epigastric pain    ED Discharge Orders         Ordered    sucralfate (CARAFATE) 1 g tablet  3 times daily with meals & bedtime     01/23/19 0644           Montine Circle, PA-C 01/23/19 2620    Merrily Pew, MD 01/24/19 0025

## 2019-01-23 NOTE — ED Notes (Signed)
Patient verbalizes understanding of discharge instructions. Opportunity for questioning and answers were provided. Armband removed by staff, pt discharged from ED home via POV.  

## 2019-01-23 NOTE — ED Triage Notes (Signed)
Pt BIB [facility] for substernal chest pain that started 2 days ago. EMS advised the pt explained to them that the pain feels like a burning sensation. EMS reports the pt told them that this is the same feeling when he had his heart attack 15 years ago. EMS reports the pt does have a history of GERD. EMS advised 12 lead was unremarkable, vital signs stable, pt refused ASA due to ulcers and hx of GI bleeds, and IV established as noted.    Pt reports he originally thought this was GERD and that is why he waited. Pt describes this pain as a burning sensation but does endorse that this is the pain he had when he had his heart attack.

## 2019-01-29 ENCOUNTER — Emergency Department (HOSPITAL_COMMUNITY)
Admission: EM | Admit: 2019-01-29 | Discharge: 2019-01-29 | Disposition: A | Discharge status: Home / Self Care | Payer: Medicaid Other | Attending: Emergency Medicine | Admitting: Emergency Medicine

## 2019-01-29 ENCOUNTER — Emergency Department (HOSPITAL_COMMUNITY): Payer: Medicaid Other

## 2019-01-29 ENCOUNTER — Encounter (HOSPITAL_COMMUNITY): Payer: Self-pay | Admitting: Emergency Medicine

## 2019-01-29 ENCOUNTER — Other Ambulatory Visit: Payer: Self-pay

## 2019-01-29 DIAGNOSIS — Y92017 Garden or yard in single-family (private) house as the place of occurrence of the external cause: Secondary | ICD-10-CM | POA: Diagnosis not present

## 2019-01-29 DIAGNOSIS — Y999 Unspecified external cause status: Secondary | ICD-10-CM | POA: Insufficient documentation

## 2019-01-29 DIAGNOSIS — W208XXA Other cause of strike by thrown, projected or falling object, initial encounter: Secondary | ICD-10-CM | POA: Insufficient documentation

## 2019-01-29 DIAGNOSIS — Z79899 Other long term (current) drug therapy: Secondary | ICD-10-CM | POA: Diagnosis not present

## 2019-01-29 DIAGNOSIS — I2510 Atherosclerotic heart disease of native coronary artery without angina pectoris: Secondary | ICD-10-CM | POA: Diagnosis not present

## 2019-01-29 DIAGNOSIS — S90512A Abrasion, left ankle, initial encounter: Secondary | ICD-10-CM | POA: Insufficient documentation

## 2019-01-29 DIAGNOSIS — S9002XA Contusion of left ankle, initial encounter: Secondary | ICD-10-CM | POA: Diagnosis not present

## 2019-01-29 DIAGNOSIS — Y93H2 Activity, gardening and landscaping: Secondary | ICD-10-CM | POA: Diagnosis not present

## 2019-01-29 DIAGNOSIS — F17210 Nicotine dependence, cigarettes, uncomplicated: Secondary | ICD-10-CM | POA: Insufficient documentation

## 2019-01-29 DIAGNOSIS — S99912A Unspecified injury of left ankle, initial encounter: Secondary | ICD-10-CM | POA: Diagnosis present

## 2019-01-29 MED ORDER — OXYCODONE HCL 5 MG PO TABS
5.0000 mg | ORAL_TABLET | Freq: Once | ORAL | Status: AC
  Administered 2019-01-29: 5 mg via ORAL
  Filled 2019-01-29: qty 1

## 2019-01-29 MED ORDER — ACETAMINOPHEN 500 MG PO TABS
1000.0000 mg | ORAL_TABLET | Freq: Once | ORAL | Status: AC
  Administered 2019-01-29: 1000 mg via ORAL
  Filled 2019-01-29: qty 2

## 2019-01-29 NOTE — ED Triage Notes (Signed)
Pt was mowing his grass when a rock hit his L ankle. Pt reports pain to l ankle, has small abrasion. Pt did not fall or pass out.  Pt reports worsening pain with elevating leg or walking.

## 2019-01-29 NOTE — Discharge Instructions (Signed)
Rest - please stay off ankle as much as possible for the next couple of days Ice - ice for 20 minutes at a time, several times a day Compression - wear brace to provide support Elevate - elevate ankle above level of heart Take Tylenol for pain

## 2019-01-29 NOTE — ED Notes (Signed)
Pt verbalizes understanding of d/c instructions. Pt taken to lobby in wheelchair at d/c with all belongings.   

## 2019-01-29 NOTE — ED Notes (Signed)
Ortho tech at bedside 

## 2019-01-29 NOTE — ED Provider Notes (Signed)
[facility] EMERGENCY DEPARTMENT Provider Note   CSN: [id number] Arrival date & time: 01/29/19  1555     History   Chief Complaint Chief Complaint  Patient presents with   Ankle Pain    HPI [name] [name] is a 53 y.o. male who presents with L ankle pain. PMH significant for CAD, hx of substance abuse (ETOH, crack, THC, tobacco), gastritis. He states that he was mowing the lawn today and a big rock kicked up and flew in to his L ankle. It caused immediate onset of pain and swelling. He was able to walk on it but as it's become more swollen it is hurting a lot more. The pain radiates through to the other side of the ankle. He denies knee, leg, or foot pain. He is not on blood thinners.       HPI  Past Medical History:  Diagnosis Date   Abnormal ECG    a. early repolarization   Arthritis    "my whole left side" (05/03/2014)   Bipolar disorder ([facility])    Bradycardia    a. asymptomatic   CAD in native artery    a. Nonobstructive cath 11/2007;  b. Presented with ST elevation - Nonobstructive cath 08/2011   Chest pain, mid sternal    Coronary artery disease    GERD (gastroesophageal reflux disease)    History of cocaine abuse ([place])    a. quit ? 2009   History of ETOH abuse    a. drinks 2 "40's" / wk   Marijuana abuse    a. uses ~ 1x /wk or less   Pneumonia    Stomach ulcer    Syncope    a. 12/2010 - presumed to be vasovagal   Tobacco abuse     Patient Active Problem List   Diagnosis Date Noted   MDD (major depressive disorder), recurrent severe, without psychosis ([place]) 02/08/2018   Major depressive disorder, recurrent episode, severe with anxious distress ([place]) 05/23/2016   PTSD (post-traumatic stress disorder) 05/23/2016   Cocaine use disorder, moderate, in early remission ([place]) 05/23/2016   Alcohol use disorder, moderate, dependence ([place]) 05/23/2016   Affective psychosis, bipolar ([place]) 04/30/2016   Cocaine abuse with  cocaine-induced mood disorder ([place]) 03/04/2016   Alcohol dependence with uncomplicated withdrawal ([place])    Alcohol use disorder, severe, dependence ([place]) 01/18/2015   Alcohol dependence with alcohol-induced mood disorder ([place])    Suicidal ideation    Acute pancreatitis 03/20/2012   Polysubstance abuse ([place]) 03/20/2012   Cannabis abuse 01/12/2012   Alcohol dependence ([place]) 10/15/2011   Abnormal ECG    Chest pain    CAD in native artery    GERD (gastroesophageal reflux disease)    Tobacco abuse    History of cocaine abuse ([place])    History of ETOH abuse    Bradycardia    Syncope     Past Surgical History:  Procedure Laterality Date   CARDIAC CATHETERIZATION  2009; 08/2011   [name] 08/06/2011   LEFT HEART CATHETERIZATION WITH CORONARY ANGIOGRAM N/A 08/16/2011   Procedure: LEFT HEART CATHETERIZATION WITH CORONARY ANGIOGRAM;  Surgeon: [name], MD;  Location: [facility] CATH LAB;  Service: Cardiovascular;  Laterality: N/A;        Home Medications    Prior to Admission medications   Medication Sig Start Date End Date Taking? Authorizing Provider  acamprosate (CAMPRAL) 333 MG tablet Take 333 mg by mouth 3 (three) times daily. 08/28/18   [provider]  escitalopram (  LEXAPRO) 5 MG tablet Take 5 mg by mouth daily. 08/28/18   [provider]  gabapentin (NEURONTIN) 300 MG capsule Take 1 capsule (300 mg total) by mouth 2 (two) times daily. 11/30/18   Connye Burkitt, NP  hydrOXYzine (VISTARIL) 25 MG capsule Take 25 mg by mouth every 6 (six) hours as needed for anxiety or vomiting.  08/28/18   [provider]  pantoprazole (PROTONIX) 20 MG tablet Take 20 mg by mouth daily. 08/28/18   [provider]  sucralfate (CARAFATE) 1 g tablet Take 1 tablet (1 g total) by mouth 4 (four) times daily -  with meals and at bedtime. 01/23/19   Montine Circle, PA-C  traZODone (DESYREL) 50 MG tablet Take 50 mg by mouth at bedtime as needed for sleep.   08/28/18   [provider]    Family History History reviewed. No pertinent family history.  Social History Social History   Tobacco Use   Smoking status: Current Some Day Smoker    Packs/day: 0.50    Years: 20.00    Pack years: 10.00    Types: Cigarettes    Last attempt to quit: 06/24/2015    Years since quitting: 3.6   Smokeless tobacco: Never Used  Substance Use Topics   Alcohol use: Yes    Alcohol/week: 48.0 standard drinks    Types: 48 Cans of beer per week    Comment: 2 40 oz beers per day   Drug use: Yes    Types: Marijuana, Cocaine     Allergies   Aspirin, Pepperoni [pickled meat], and Tomato   Review of Systems Review of Systems  Musculoskeletal: Positive for arthralgias and joint swelling.  Skin: Positive for wound.     Physical Exam Updated Vital Signs BP (!) 92/57 (BP Location: Right Arm)    Pulse (!) 46    Temp 97.6 F (36.4 C) (Oral)    Resp 17    SpO2 100%   Physical Exam Vitals signs and nursing note reviewed.  Constitutional:      General: He is not in acute distress.    Appearance: Normal appearance. He is well-developed. He is not ill-appearing.     Comments: Initially has blanket over head but then is cooperative with exam   HENT:     Head: Normocephalic and atraumatic.  Eyes:     General: No scleral icterus.       Right eye: No discharge.        Left eye: No discharge.     Conjunctiva/sclera: Conjunctivae normal.     Pupils: Pupils are equal, round, and reactive to light.  Neck:     Musculoskeletal: Normal range of motion.  Cardiovascular:     Rate and Rhythm: Normal rate and regular rhythm.  Pulmonary:     Effort: Pulmonary effort is normal. No respiratory distress.     Breath sounds: Normal breath sounds.  Abdominal:     General: There is no distension.     Palpations: Abdomen is soft.  Musculoskeletal:     Comments: L ankle: There is swelling over the medial ankle with associated abrasion. There is associated  tenderness. No calf or foot tenderness  Skin:    General: Skin is warm and dry.  Neurological:     Mental Status: He is alert and oriented to person, place, and time.  Psychiatric:        Behavior: Behavior normal.      ED Treatments / Results  Labs (all labs  ordered are listed, but only abnormal results are displayed) Labs Reviewed - No data to display  EKG None  Radiology Dg Ankle Complete Left  Result Date: 01/29/2019 CLINICAL DATA:  Ankle pain EXAM: LEFT ANKLE COMPLETE - 3+ VIEW COMPARISON:  None. FINDINGS: Plantar calcaneal spur. No acute bony abnormality. Specifically, no fracture, subluxation, or dislocation. Joint spaces maintained IMPRESSION: No acute bony abnormality. Electronically Signed   By: Rolm Baptise M.D.   On: 01/29/2019 18:54    Procedures Procedures (including critical care time)  Medications Ordered in ED Medications  acetaminophen (TYLENOL) tablet 1,000 mg (1,000 mg Oral Given 01/29/19 1712)  oxyCODONE (Oxy IR/ROXICODONE) immediate release tablet 5 mg (5 mg Oral Given 01/29/19 1927)     Initial Impression / Assessment and Plan / ED Course  I have reviewed the triage vital signs and the nursing notes.  Pertinent labs & imaging results that were available during my care of the patient were reviewed by me and considered in my medical decision making (see chart for details).  53 year old male presents with acute L ankle pain after a rock hit him on the medial side. There is associated swelling and tenderness. Will obtain xray.  Xray is negative. Will treat as contusion. He was given ASO and RICE was discussed. Advised Tylenol for pain.  Final Clinical Impressions(s) / ED Diagnoses   Final diagnoses:  Contusion of left ankle, initial encounter    ED Discharge Orders    None       Recardo Evangelist, PA-C 01/29/19 2104    Maudie Flakes, MD 01/30/19 1309

## 2019-01-29 NOTE — ED Notes (Signed)
Pt transported to Xray. 

## 2019-03-23 ENCOUNTER — Encounter (HOSPITAL_COMMUNITY): Payer: Self-pay | Admitting: *Deleted

## 2019-03-23 ENCOUNTER — Emergency Department (HOSPITAL_COMMUNITY)
Admission: EM | Admit: 2019-03-23 | Discharge: 2019-03-23 | Disposition: A | Discharge status: Other Acute Inpatient Hospital | Payer: Medicaid Other | Attending: Emergency Medicine | Admitting: Emergency Medicine

## 2019-03-23 ENCOUNTER — Other Ambulatory Visit: Payer: Self-pay

## 2019-03-23 ENCOUNTER — Observation Stay (HOSPITAL_COMMUNITY)
Admission: AD | Admit: 2019-03-23 | Discharge: 2019-03-24 | Disposition: A | Discharge status: Other Acute Inpatient Hospital | Payer: Medicaid Other | Source: Intra-hospital | Attending: Psychiatry | Admitting: Psychiatry

## 2019-03-23 ENCOUNTER — Encounter (HOSPITAL_COMMUNITY): Payer: Self-pay | Admitting: Student

## 2019-03-23 DIAGNOSIS — F1911 Other psychoactive substance abuse, in remission: Secondary | ICD-10-CM | POA: Diagnosis not present

## 2019-03-23 DIAGNOSIS — F332 Major depressive disorder, recurrent severe without psychotic features: Secondary | ICD-10-CM | POA: Insufficient documentation

## 2019-03-23 DIAGNOSIS — F1020 Alcohol dependence, uncomplicated: Secondary | ICD-10-CM | POA: Insufficient documentation

## 2019-03-23 DIAGNOSIS — G4700 Insomnia, unspecified: Secondary | ICD-10-CM | POA: Insufficient documentation

## 2019-03-23 DIAGNOSIS — K219 Gastro-esophageal reflux disease without esophagitis: Secondary | ICD-10-CM | POA: Insufficient documentation

## 2019-03-23 DIAGNOSIS — M1990 Unspecified osteoarthritis, unspecified site: Secondary | ICD-10-CM | POA: Diagnosis not present

## 2019-03-23 DIAGNOSIS — F17210 Nicotine dependence, cigarettes, uncomplicated: Secondary | ICD-10-CM | POA: Diagnosis not present

## 2019-03-23 DIAGNOSIS — Z20828 Contact with and (suspected) exposure to other viral communicable diseases: Secondary | ICD-10-CM | POA: Diagnosis not present

## 2019-03-23 DIAGNOSIS — Z79899 Other long term (current) drug therapy: Secondary | ICD-10-CM | POA: Insufficient documentation

## 2019-03-23 DIAGNOSIS — R45851 Suicidal ideations: Secondary | ICD-10-CM | POA: Insufficient documentation

## 2019-03-23 DIAGNOSIS — F419 Anxiety disorder, unspecified: Secondary | ICD-10-CM | POA: Diagnosis not present

## 2019-03-23 DIAGNOSIS — I2510 Atherosclerotic heart disease of native coronary artery without angina pectoris: Secondary | ICD-10-CM | POA: Insufficient documentation

## 2019-03-23 DIAGNOSIS — Z886 Allergy status to analgesic agent status: Secondary | ICD-10-CM | POA: Diagnosis not present

## 2019-03-23 DIAGNOSIS — F329 Major depressive disorder, single episode, unspecified: Secondary | ICD-10-CM | POA: Diagnosis present

## 2019-03-23 DIAGNOSIS — F319 Bipolar disorder, unspecified: Secondary | ICD-10-CM | POA: Diagnosis present

## 2019-03-23 LAB — CBC
HCT: 42 % (ref 39.0–52.0)
Hemoglobin: 14.4 g/dL (ref 13.0–17.0)
MCH: 31.6 pg (ref 26.0–34.0)
MCHC: 34.3 g/dL (ref 30.0–36.0)
MCV: 92.1 fL (ref 80.0–100.0)
Platelets: 280 10*3/uL (ref 150–400)
RBC: 4.56 MIL/uL (ref 4.22–5.81)
RDW: 13.2 % (ref 11.5–15.5)
WBC: 5 10*3/uL (ref 4.0–10.5)
nRBC: 0 % (ref 0.0–0.2)

## 2019-03-23 LAB — RAPID URINE DRUG SCREEN, HOSP PERFORMED
Amphetamines: NOT DETECTED
Barbiturates: NOT DETECTED
Benzodiazepines: NOT DETECTED
Cocaine: POSITIVE — AB
Opiates: NOT DETECTED
Tetrahydrocannabinol: POSITIVE — AB

## 2019-03-23 LAB — COMPREHENSIVE METABOLIC PANEL
ALT: 23 U/L (ref 0–44)
AST: 30 U/L (ref 15–41)
Albumin: 4.4 g/dL (ref 3.5–5.0)
Alkaline Phosphatase: 75 U/L (ref 38–126)
Anion gap: 10 (ref 5–15)
BUN: 9 mg/dL (ref 6–20)
CO2: 24 mmol/L (ref 22–32)
Calcium: 8.7 mg/dL — ABNORMAL LOW (ref 8.9–10.3)
Chloride: 107 mmol/L (ref 98–111)
Creatinine, Ser: 1.15 mg/dL (ref 0.61–1.24)
GFR calc Af Amer: >60 mL/min (ref >60)
GFR calc non Af Amer: >60 mL/min (ref >60)
Glucose, Bld: 88 mg/dL (ref 70–99)
Potassium: 3.7 mmol/L (ref 3.5–5.1)
Sodium: 141 mmol/L (ref 135–145)
Total Bilirubin: 0.7 mg/dL (ref 0.3–1.2)
Total Protein: 7.9 g/dL (ref 6.5–8.1)

## 2019-03-23 LAB — SARS CORONAVIRUS 2 BY RT PCR (HOSPITAL ORDER, PERFORMED IN ~~LOC~~ HOSPITAL LAB): SARS Coronavirus 2: NEGATIVE

## 2019-03-23 LAB — ACETAMINOPHEN LEVEL: Acetaminophen (Tylenol), Serum: <10 ug/mL — ABNORMAL LOW (ref 10–30)

## 2019-03-23 LAB — SALICYLATE LEVEL: Salicylate Lvl: <7 mg/dL (ref 2.8–30.0)

## 2019-03-23 LAB — ETHANOL: Alcohol, Ethyl (B): 132 mg/dL — ABNORMAL HIGH (ref <10)

## 2019-03-23 MED ORDER — LORAZEPAM 2 MG/ML IJ SOLN: 0.0000 mg | Freq: Two times a day (BID) | INTRAMUSCULAR | Status: DC

## 2019-03-23 MED ORDER — CHLORDIAZEPOXIDE HCL 25 MG PO CAPS: 25.0000 mg | ORAL_CAPSULE | Freq: Three times a day (TID) | ORAL | Status: DC

## 2019-03-23 MED ORDER — LORAZEPAM 2 MG/ML IJ SOLN: 0.0000 mg | Freq: Four times a day (QID) | INTRAMUSCULAR | Status: DC

## 2019-03-23 MED ORDER — ACETAMINOPHEN 325 MG PO TABS: 650.0000 mg | ORAL_TABLET | Freq: Four times a day (QID) | ORAL | Status: DC | PRN

## 2019-03-23 MED ORDER — FAMOTIDINE 10 MG PO TABS
20.0000 mg | ORAL_TABLET | Freq: Every day | ORAL | Status: DC
  Administered 2019-03-23 – 2019-03-24 (×2): 20 mg via ORAL
  Filled 2019-03-23 (×2): qty 2

## 2019-03-23 MED ORDER — LORAZEPAM 1 MG PO TABS: 0.0000 mg | ORAL_TABLET | Freq: Four times a day (QID) | ORAL | Status: DC

## 2019-03-23 MED ORDER — CHLORDIAZEPOXIDE HCL 25 MG PO CAPS: 25.0000 mg | ORAL_CAPSULE | Freq: Every day | ORAL | Status: DC

## 2019-03-23 MED ORDER — VITAMIN B-1 100 MG PO TABS
100.0000 mg | ORAL_TABLET | Freq: Every day | ORAL | Status: DC
  Administered 2019-03-24: 100 mg via ORAL
  Filled 2019-03-23: qty 1

## 2019-03-23 MED ORDER — VITAMIN B-1 100 MG PO TABS
100.0000 mg | ORAL_TABLET | Freq: Every day | ORAL | Status: DC
  Administered 2019-03-23: 100 mg via ORAL
  Filled 2019-03-23: qty 1

## 2019-03-23 MED ORDER — THIAMINE HCL 100 MG/ML IJ SOLN
100.0000 mg | Freq: Once | INTRAMUSCULAR | Status: AC
  Administered 2019-03-23: 100 mg via INTRAMUSCULAR
  Filled 2019-03-23: qty 2

## 2019-03-23 MED ORDER — LORAZEPAM 1 MG PO TABS: 0.0000 mg | ORAL_TABLET | Freq: Two times a day (BID) | ORAL | Status: DC

## 2019-03-23 MED ORDER — CHLORDIAZEPOXIDE HCL 25 MG PO CAPS: 25.0000 mg | ORAL_CAPSULE | Freq: Four times a day (QID) | ORAL | Status: DC | PRN

## 2019-03-23 MED ORDER — ALUM & MAG HYDROXIDE-SIMETH 200-200-20 MG/5ML PO SUSP: 30.0000 mL | Freq: Four times a day (QID) | ORAL | Status: DC | PRN

## 2019-03-23 MED ORDER — LOPERAMIDE HCL 2 MG PO CAPS: 2.0000 mg | ORAL_CAPSULE | ORAL | Status: DC | PRN

## 2019-03-23 MED ORDER — THIAMINE HCL 100 MG/ML IJ SOLN: 100.0000 mg | Freq: Every day | INTRAMUSCULAR | Status: DC

## 2019-03-23 MED ORDER — CHLORDIAZEPOXIDE HCL 25 MG PO CAPS: 25.0000 mg | ORAL_CAPSULE | ORAL | Status: DC

## 2019-03-23 MED ORDER — MAGNESIUM HYDROXIDE 400 MG/5ML PO SUSP: 30.0000 mL | Freq: Every day | ORAL | Status: DC | PRN

## 2019-03-23 MED ORDER — ONDANSETRON HCL 4 MG PO TABS: 4.0000 mg | ORAL_TABLET | Freq: Three times a day (TID) | ORAL | Status: DC | PRN

## 2019-03-23 MED ORDER — CHLORDIAZEPOXIDE HCL 25 MG PO CAPS
25.0000 mg | ORAL_CAPSULE | Freq: Four times a day (QID) | ORAL | Status: DC
  Administered 2019-03-23 – 2019-03-24 (×3): 25 mg via ORAL
  Filled 2019-03-23 (×3): qty 1

## 2019-03-23 MED ORDER — INFLUENZA VAC SPLIT QUAD 0.5 ML IM SUSY: 0.5000 mL | PREFILLED_SYRINGE | INTRAMUSCULAR | Status: DC

## 2019-03-23 MED ORDER — NICOTINE 21 MG/24HR TD PT24: 21.0000 mg | MEDICATED_PATCH | Freq: Every day | TRANSDERMAL | Status: DC

## 2019-03-23 MED ORDER — ADULT MULTIVITAMIN W/MINERALS CH
1.0000 | ORAL_TABLET | Freq: Every day | ORAL | Status: DC
  Administered 2019-03-24: 1 via ORAL
  Filled 2019-03-23: qty 1

## 2019-03-23 MED ORDER — HYDROXYZINE HCL 25 MG PO TABS
25.0000 mg | ORAL_TABLET | Freq: Four times a day (QID) | ORAL | Status: DC | PRN
  Administered 2019-03-24: 25 mg via ORAL
  Filled 2019-03-23: qty 1

## 2019-03-23 NOTE — BH Assessment (Signed)
Assessment Note  [name] [name] is an 53 y.o. male that presents this date brought in by [facility] after patient contacted law enforcement and ask they transport him to the ED. Patient reports S/I on arrival with multiple plans although will not elaborate during assessment. Patient has a history of depression and polysubstance abuse.  days. Patient states that he does not feel like he has control of his thoughts or actions. Patient declines to discuss his history and states "I just need help." Patient cannot identify any immediate stressors. No alleviating or aggravating factors. He reports daily alcohol use for the last week although renders limited history in reference to time frames and amounts. Patient reports last use earlier this date when he stated he "drank a few beers." Patient denies any other SA use although per chart review has a history of cocaine and cannabis use. Patient's BAL and UDS pending. Denies history of alcohol withdrawal. Patient was last seen on 11/26/18 when he presented with similar symptoms and was later admitted inpatient at [facility]. Patient states he did not follow up with OP care after discharge and denies any current medication interventions. Patient states soon after being discharged he relapsed on alcohol and his depression has worsened over the last two weeks with symptoms to include: guilt and feeling worthless. Patient states he currently resides with family members. Patient states that he has experienced suicidal thoughts in the past and reports one prior attempt. Patient states that he has been diagnosed with bipolar disorder over ten years ago and reports he also has a learning disability. Patient declines to participate in the last part of the assessment stating "I have said enough." Patient presents as drowsy and irritable. His mood depressed and his affect depressed, Patient is observed to be currently impaired. His memory is recent impaired and thoughts disorganized. He  does not appear to be responding to internal stimuli. His eye contact is poor and his speech slurred. He has poor judgment, insight and impulse control.    Diagnosis: F33.2 MDD recurrent without psychotic features, severe, Polysubstance abuse  Past Medical History:  Past Medical History:  Diagnosis Date  . Abnormal ECG    a. early repolarization  . Arthritis    "my whole left side" (05/03/2014)  . Bipolar disorder ([place])   . Bradycardia    a. asymptomatic  . CAD in native artery    a. Nonobstructive cath 11/2007;  b. Presented with ST elevation - Nonobstructive cath 08/2011  . Chest pain, mid sternal   . Coronary artery disease   . GERD (gastroesophageal reflux disease)   . History of cocaine abuse ([place])    a. quit ? 2009  . History of ETOH abuse    a. drinks 2 "40's" / wk  . Marijuana abuse    a. uses ~ 1x /wk or less  . Pneumonia   . Stomach ulcer   . Syncope    a. 12/2010 - presumed to be vasovagal  . Tobacco abuse     Past Surgical History:  Procedure Laterality Date  . CARDIAC CATHETERIZATION  2009; 08/2011   [name] 08/06/2011  . LEFT HEART CATHETERIZATION WITH CORONARY ANGIOGRAM N/A 08/16/2011   Procedure: LEFT HEART CATHETERIZATION WITH CORONARY ANGIOGRAM;  Surgeon: [name], MD;  Location: [facility] CATH LAB;  Service: Cardiovascular;  Laterality: N/A;    Family History: History reviewed. No pertinent family history.  Social History:  reports that he has been smoking cigarettes. He has a 10.00 pack-year smoking  history. He has never used smokeless tobacco. He reports current alcohol use of about 48.0 standard drinks of alcohol per week. He reports current drug use. Drugs: Marijuana and Cocaine.  Additional Social History:  Alcohol / Drug Use Pain Medications: See MAR Prescriptions: See MAR Over the Counter: See MAR History of alcohol / drug use?: Yes Longest period of sobriety (when/how long): Unknown Negative Consequences of Use: Personal  relationships Withdrawal Symptoms: (Denies) Substance #1 Name of Substance 1: Alcohol per hx 1 - Age of First Use: UTA 1 - Amount (size/oz): Varies 1 - Frequency: Varies 1 - Duration: Ongoing 1 - Last Use / Amount: 03/22/19 4 12  oz beers Substance #2 Name of Substance 2: Cocaine per hx 2 - Age of First Use: UTA 2 - Amount (size/oz): Varies 2 - Frequency: Varies 2 - Duration: Ongoing 2 - Last Use / Amount: UTA UDS pending  CIWA: CIWA-Ar BP: 114/70 Pulse Rate: 69 COWS:    Allergies:  Allergies  Allergen Reactions  . Aspirin Other (See Comments)    Aggravates ulcer. "Causes chest pain."  . Pepperoni [Pickled Meat] Other (See Comments)    aggrevates ulcer  . Tomato Other (See Comments)    Foods with tomato sauce aggrevate ulcers    Home Medications: (Not in a hospital admission)   OB/GYN Status:  No LMP for male patient.  General Assessment Data Location of Assessment: [facility] ED TTS Assessment: In system Is this a Tele or Face-to-Face Assessment?: Face-to-Face Is this an Initial Assessment or a Re-assessment for this encounter?: Initial Assessment Patient Accompanied by:: N/A Language Other than English: No Living Arrangements: Other (Comment) What gender do you identify as?: Male Marital status: Single Living Arrangements: Parent Can pt return to current living arrangement?: Yes Admission Status: Voluntary Is patient capable of signing voluntary admission?: Yes Referral Source: Self/Family/Friend Insurance type: Medicaid  Medical Screening Exam ([place]) Medical Exam completed: Yes  Crisis Care Plan Living Arrangements: Parent Legal Guardian: (NA) Name of Psychiatrist: None  Education Status Is patient currently in school?: No Is the patient employed, unemployed or receiving disability?: Receiving disability income  Risk to self with the past 6 months Suicidal Ideation: Yes-Currently Present Has patient been a risk to self within the past 6  months prior to admission? : No Suicidal Intent: Yes-Currently Present Has patient had any suicidal intent within the past 6 months prior to admission? : No Is patient at risk for suicide?: Yes Suicidal Plan?: Yes-Currently Present Has patient had any suicidal plan within the past 6 months prior to admission? : No Specify Current Suicidal Plan: Multiple plans Access to Means: No What has been your use of drugs/alcohol within the last 12 months?: Current use Previous Attempts/Gestures: Yes How many times?: 1 Other Self Harm Risks: (Excessive SA use) Triggers for Past Attempts: Unknown Intentional Self Injurious Behavior: None Family Suicide History: No Recent stressful life event(s): Other (Comment)(Excessive SA use) Persecutory voices/beliefs?: No Depression: Yes Depression Symptoms: Feeling worthless/self pity Substance abuse history and/or treatment for substance abuse?: No Suicide prevention information given to non-admitted patients: Not applicable  Risk to Others within the past 6 months Homicidal Ideation: No Does patient have any lifetime risk of violence toward others beyond the six months prior to admission? : No Thoughts of Harm to Others: No Current Homicidal Intent: No Current Homicidal Plan: No Access to Homicidal Means: No Identified Victim: NA History of harm to others?: No Assessment of Violence: None Noted Violent Behavior Description: NA  Does patient  have access to weapons?: No Criminal Charges Pending?: No Does patient have a court date: No Is patient on probation?: No  Psychosis Hallucinations: None noted Delusions: None noted  Mental Status Report Appearance/Hygiene: Unremarkable Eye Contact: Poor Motor Activity: Freedom of movement Speech: Slow, Slurred Level of Consciousness: Drowsy Mood: Irritable Affect: Flat Anxiety Level: Minimal Thought Processes: Thought Blocking Judgement: Impaired Orientation: Unable to assess Obsessive Compulsive  Thoughts/Behaviors: None  Cognitive Functioning Concentration: Decreased Memory: Unable to Assess Is patient IDD: No Insight: Unable to Assess Impulse Control: Unable to Assess Appetite: (UTA) Have you had any weight changes? : (UTA) Sleep: (UTA) Total Hours of Sleep: (UTA) Vegetative Symptoms: None  ADLScreening [facility] Assessment Services) Patient's cognitive ability adequate to safely complete daily activities?: Yes Patient able to express need for assistance with ADLs?: Yes Independently performs ADLs?: Yes (appropriate for developmental age)  Prior Inpatient Therapy Prior Inpatient Therapy: Yes Prior Therapy Dates: 2020, 2019 Prior Therapy Facilty/Provider(s): [facility], [facility] Reason for Treatment: MH issues  Prior Outpatient Therapy Prior Outpatient Therapy: No Does patient have an ACCT team?: No Does patient have Intensive In-House Services?  : No Does patient have [facility] services? : No Does patient have P4CC services?: No  ADL Screening (condition at time of admission) Patient's cognitive ability adequate to safely complete daily activities?: Yes Is the patient deaf or have difficulty hearing?: No Does the patient have difficulty seeing, even when wearing glasses/contacts?: No Does the patient have difficulty concentrating, remembering, or making decisions?: No Patient able to express need for assistance with ADLs?: Yes Does the patient have difficulty dressing or bathing?: No Independently performs ADLs?: Yes (appropriate for developmental age) Does the patient have difficulty walking or climbing stairs?: No Weakness of Legs: None Weakness of Arms/Hands: None  Home Assistive Devices/Equipment Home Assistive Devices/Equipment: None  Therapy Consults (therapy consults require a physician order) PT Evaluation Needed: No OT Evalulation Needed: No SLP Evaluation Needed: No Abuse/Neglect Assessment (Assessment to be complete while patient is alone) Abuse/Neglect  Assessment Can Be Completed: Yes Physical Abuse: Denies Verbal Abuse: Denies Sexual Abuse: Denies Exploitation of patient/patient's resources: Denies Self-Neglect: Denies Values / Beliefs Cultural Requests During Hospitalization: None Spiritual Requests During Hospitalization: None Consults Spiritual Care Consult Needed: No Social Work Consult Needed: No Regulatory affairs officer (For Healthcare) Does Patient Have a Medical Advance Directive?: No Would patient like information on creating a medical advance directive?: No - Patient declined Nutrition Screen- [place] [facility] Patient's home diet: Regular        Disposition:  Disposition Initial Assessment Completed for this Encounter: Yes Disposition of Patient: (Observe and monitor)  On Site Evaluation by:   Reviewed with Physician:    Mamie Nick 03/23/2019 2:02 PM

## 2019-03-23 NOTE — H&P (Addendum)
[facility] Provider Admission PAA/H&P  Patient Identification: [name] [name] MRN:  [id number] Date of Evaluation:  03/23/2019 Chief Complaint:  bipolar disorder  Principal Diagnosis: MDD (major depressive disorder), recurrent severe, without psychosis ([place]) Diagnosis:  Principal Problem:   MDD (major depressive disorder), recurrent severe, without psychosis ([place]) Active Problems:   Alcohol use disorder, severe, dependence ([place])  History of Present Illness:  TTS Assessment:  [name] [name] is an 53 y.o. male that presents this date brought in by [facility] after patient contacted law enforcement and ask they transport him to the ED. Patient reports S/I on arrival with multiple plans although will not elaborate during assessment. Patient has a history of depression and polysubstance abuse.  days.Patient states that he does not feel like he has control of his thoughts or actions. Patient declines to discuss his history and states "I just need help." Patient cannot identify any immediate stressors. No alleviating or aggravating factors. He reports daily alcohol use for the last week although renders limited history in reference to time frames and amounts. Patient reports last use earlier this date when he stated he "drank a few beers." Patient denies any other SA use although per chart review has a history of cocaine and cannabis use. Patient's BAL and UDS pending. Denies history of alcohol withdrawal. Patient was last seen on 11/26/18 when he presented with similar symptoms and was later admitted inpatient at [facility]. Patient states he did not follow up with OP care after discharge and denies any current medication interventions. Patient states soon after being discharged he relapsed on alcohol and his depression has worsened over the last two weeks with symptoms to include: guilt and feeling worthless. Patient states he currently resides with family members. Patient states that he has experienced  suicidal thoughts in the past and reports one prior attempt. Patient states that he has been diagnosed with bipolar disorder over ten years ago and reports he also has a learning disability. Patient declines to participate in the last part of the assessment stating "I have said enough."  Evaluation on Unit: Reviewed TTS assessment and validated with patient. Found patient lying in his bed. Reports that he has been feeling stressed since losing his girlfriend of 14 years about 8 months ago. Rates his anxiety 8/10 and depression 10/10 "I'm just going through so much right now". Reports history of polysubstance abuse (cocaine & etoh) "etoh has been my main problem, I started drinking since I was 53 years old, now I drink 3-4 40 oz beers everyday". Stated his last drink was on 03/22/19 prior to going to the ED. Reports feeling guilty lately about his substance abuse due to strained relationships with his only daughter and his parents.   On evaluation patient is alert and oriented x 4, pleasant, and cooperative. Speech is clear and coherent. Eye contact is fair. Mood is depressed and affect is congruent with mood. Thought process is coherent, linear, and descriptions of association are intact. Thought content Is logical. Denies audiovisual hallucinations. No indication that patient is responding to internal stimuli. No indication of delusional thought content.  States that he is continuing to have suicidal thoughts, contracts for safety on the unit.  Denies homicidal ideations.   Associated Signs/Symptoms: Depression Symptoms:  depressed mood, anhedonia, insomnia, psychomotor agitation, feelings of worthlessness/guilt, hopelessness, suicidal thoughts with specific plan, anxiety, (Hypo) Manic Symptoms:  Impulsivity, Irritable Mood, Anxiety Symptoms:  Excessive Worry, Psychotic Symptoms:  none present PTSD Symptoms: Negative Total Time spent with  patient: 20 minutes  Past Psychiatric History:  Alcohol Use Disorder, MDD  Is the patient at risk to self? Yes.    Has the patient been a risk to self in the past 6 months? Yes.    Has the patient been a risk to self within the distant past? Yes.    Is the patient a risk to others? No.  Has the patient been a risk to others in the past 6 months? No.  Has the patient been a risk to others within the distant past? No.   Prior Inpatient Therapy:   Prior Outpatient Therapy:    Alcohol Screening: 1. How often do you have a drink containing alcohol?: 4 or more times a week 2. How many drinks containing alcohol do you have on a typical day when you are drinking?: 3 or 4 3. How often do you have six or more drinks on one occasion?: Monthly AUDIT-C Score: 7 4. How often during the last year have you found that you were not able to stop drinking once you had started?: Daily or almost daily 5. How often during the last year have you failed to do what was normally expected from you becasue of drinking?: Less than monthly 6. How often during the last year have you needed a first drink in the morning to get yourself going after a heavy drinking session?: Weekly 7. How often during the last year have you had a feeling of guilt of remorse after drinking?: Daily or almost daily 8. How often during the last year have you been unable to remember what happened the night before because you had been drinking?: Less than monthly 9. Have you or someone else been injured as a result of your drinking?: No 10. Has a relative or friend or a doctor or another health worker been concerned about your drinking or suggested you cut down?: Yes, during the last year Alcohol Use Disorder Identification Test Final Score (AUDIT): 24 Substance Abuse History in the last 12 months:  Yes.   Consequences of Substance Abuse: housing Previous Psychotropic Medications: Yes  Psychological Evaluations: No  Past Medical History:  Past Medical History:  Diagnosis Date  . Abnormal  ECG    a. early repolarization  . Arthritis    "my whole left side" (05/03/2014)  . Bipolar disorder ([place])   . Bradycardia    a. asymptomatic  . CAD in native artery    a. Nonobstructive cath 11/2007;  b. Presented with ST elevation - Nonobstructive cath 08/2011  . Chest pain, mid sternal   . Coronary artery disease   . GERD (gastroesophageal reflux disease)   . History of cocaine abuse ([place])    a. quit ? 2009  . History of ETOH abuse    a. drinks 2 "40's" / wk  . Marijuana abuse    a. uses ~ 1x /wk or less  . Pneumonia   . Stomach ulcer   . Syncope    a. 12/2010 - presumed to be vasovagal  . Tobacco abuse     Past Surgical History:  Procedure Laterality Date  . CARDIAC CATHETERIZATION  2009; 08/2011   [name] 08/06/2011  . LEFT HEART CATHETERIZATION WITH CORONARY ANGIOGRAM N/A 08/16/2011   Procedure: LEFT HEART CATHETERIZATION WITH CORONARY ANGIOGRAM;  Surgeon: [name], MD;  Location: [facility] CATH LAB;  Service: Cardiovascular;  Laterality: N/A;   Family History: History reviewed. No pertinent family history. Family Psychiatric History: no pertinent history Tobacco Screening:  Social History:  Social History   Substance and Sexual Activity  Alcohol Use Yes  . Alcohol/week: 48.0 standard drinks  . Types: 48 Cans of beer per week   Comment: 2 40 oz beers per day     Social History   Substance and Sexual Activity  Drug Use Yes  . Types: Marijuana, Cocaine    Additional Social History:                           Allergies:   Allergies  Allergen Reactions  . Aspirin Other (See Comments)    Aggravates ulcer. "Causes chest pain."  . Pepperoni [Pickled Meat] Other (See Comments)    aggrevates ulcer  . Tomato Other (See Comments)    Foods with tomato sauce aggrevate ulcers   Lab Results:  Results for orders placed or performed during the hospital encounter of 03/23/19 (from the past 48 hour(s))  CBC     Status: None   Collection Time: 03/23/19 11:57  AM  Result Value Ref Range   WBC 5.0 4.0 - 10.5 K/uL   RBC 4.56 4.22 - 5.81 MIL/uL   Hemoglobin 14.4 13.0 - 17.0 g/dL   HCT 42.0 39.0 - 52.0 %   MCV 92.1 80.0 - 100.0 fL   MCH 31.6 26.0 - 34.0 pg   MCHC 34.3 30.0 - 36.0 g/dL   RDW 13.2 11.5 - 15.5 %   Platelets 280 150 - 400 K/uL   nRBC 0.0 0.0 - 0.2 %    Comment: Performed at [facility], [place] [address], [place], [place] [address]  Comprehensive metabolic panel     Status: Abnormal   Collection Time: 03/23/19 11:57 AM  Result Value Ref Range   Sodium 141 135 - 145 mmol/L   Potassium 3.7 3.5 - 5.1 mmol/L   Chloride 107 98 - 111 mmol/L   CO2 24 22 - 32 mmol/L   Glucose, Bld 88 70 - 99 mg/dL   BUN 9 6 - 20 mg/dL   Creatinine, Ser 1.15 0.61 - 1.24 mg/dL   Calcium 8.7 (L) 8.9 - 10.3 mg/dL   Total Protein 7.9 6.5 - 8.1 g/dL   Albumin 4.4 3.5 - 5.0 g/dL   AST 30 15 - 41 U/L   ALT 23 0 - 44 U/L   Alkaline Phosphatase 75 38 - 126 U/L   Total Bilirubin 0.7 0.3 - 1.2 mg/dL   GFR calc non Af Amer >60 >60 mL/min   GFR calc Af Amer >60 >60 mL/min   Anion gap 10 5 - 15    Comment: Performed at [facility], [place] [address] [place], [name] [address]  Acetaminophen level     Status: Abnormal   Collection Time: 03/23/19 11:57 AM  Result Value Ref Range   Acetaminophen (Tylenol), Serum <10 (L) 10 - 30 ug/mL    Comment: (NOTE) Therapeutic concentrations vary significantly. A range of 10-30 ug/mL  may be an effective concentration for many patients. However, some  are best treated at concentrations outside of this range. Acetaminophen concentrations >150 ug/mL at 4 hours after ingestion  and >50 ug/mL at 12 hours after ingestion are often associated with  toxic reactions. Performed at [facility], [place] [address], [place], [name] [id number]   Salicylate level     Status: None   Collection Time: 03/23/19 11:57 AM  Result Value Ref Range   Salicylate Lvl [id number] 2.8 - 30.0  mg/dL     Comment: Performed at [facility], [place] [address] [place], [place] [address]  Ethanol     Status: Abnormal   Collection Time: 03/23/19 11:57 AM  Result Value Ref Range   Alcohol, Ethyl (B) 132 (H) <10 mg/dL    Comment: (NOTE) Lowest detectable limit for serum alcohol is 10 mg/dL. For medical purposes only. Performed at [facility], [place] [address], [place], [place] [address]   Urine rapid drug screen (hosp performed)     Status: Abnormal   Collection Time: 03/23/19  1:40 PM  Result Value Ref Range   Opiates NONE DETECTED NONE DETECTED   Cocaine POSITIVE (A) NONE DETECTED   Benzodiazepines NONE DETECTED NONE DETECTED   Amphetamines NONE DETECTED NONE DETECTED   Tetrahydrocannabinol POSITIVE (A) NONE DETECTED   Barbiturates NONE DETECTED NONE DETECTED    Comment: (NOTE) DRUG SCREEN FOR MEDICAL PURPOSES ONLY.  IF CONFIRMATION IS NEEDED FOR ANY PURPOSE, NOTIFY LAB WITHIN 5 DAYS. LOWEST DETECTABLE LIMITS FOR URINE DRUG SCREEN Drug Class                     Cutoff (ng/mL) Amphetamine and metabolites    1000 Barbiturate and metabolites    200 Benzodiazepine                 [id number] Tricyclics and metabolites     300 Opiates and metabolites        300 Cocaine and metabolites        300 THC                            50 Performed at [facility], [place] [address], [place], [place] [address]   SARS Coronavirus 2 by RT PCR (hospital order, performed in [facility] hospital lab) Nasopharyngeal Nasopharyngeal Swab     Status: None   Collection Time: 03/23/19  3:53 PM   Specimen: Nasopharyngeal Swab  Result Value Ref Range   SARS Coronavirus 2 NEGATIVE NEGATIVE    Comment: (NOTE) If result is NEGATIVE SARS-CoV-2 target nucleic acids are NOT DETECTED. The SARS-CoV-2 RNA is generally detectable in upper and lower  respiratory specimens during the acute phase of infection. The lowest  concentration of SARS-CoV-2 viral copies this  assay can detect is 250  copies / mL. A negative result does not preclude SARS-CoV-2 infection  and should not be used as the sole basis for treatment or other  patient management decisions.  A negative result may occur with  improper specimen collection / handling, submission of specimen other  than nasopharyngeal swab, presence of viral mutation(s) within the  areas targeted by this assay, and inadequate number of viral copies  (<250 copies / mL). A negative result must be combined with clinical  observations, patient history, and epidemiological information. If result is POSITIVE SARS-CoV-2 target nucleic acids are DETECTED. The SARS-CoV-2 RNA is generally detectable in upper and lower  respiratory specimens dur ing the acute phase of infection.  Positive  results are indicative of active infection with SARS-CoV-2.  Clinical  correlation with patient history and other diagnostic information is  necessary to determine patient infection status.  Positive results do  not rule out bacterial infection or co-infection with other viruses. If result is PRESUMPTIVE POSTIVE SARS-CoV-2 nucleic acids MAY BE PRESENT.   A presumptive positive result was obtained on the submitted specimen  and confirmed on repeat testing.  While 2019 novel coronavirus  (SARS-CoV-2) nucleic acids may be present in the submitted sample  additional confirmatory testing may be necessary for epidemiological  and / or clinical management purposes  to differentiate between  SARS-CoV-2 and other Sarbecovirus currently known to infect humans.  If clinically indicated additional testing with an alternate test  methodology [phone]) is advised. The SARS-CoV-2 RNA is generally  detectable in upper and lower respiratory sp ecimens during the acute  phase of infection. The expected result is Negative. Fact Sheet for Patients:  StrictlyIdeas.no Fact Sheet for Healthcare  Providers: BankingDealers.co.za This test is not yet approved or cleared by the [place] FDA and has been authorized for detection and/or diagnosis of SARS-CoV-2 by FDA under an Emergency Use Authorization (EUA).  This EUA will remain in effect (meaning this test can be used) for the duration of the COVID-19 declaration under Section 564(b)(1) of the Act, 21 U.S.C. section 360bbb-3(b)(1), unless the authorization is terminated or revoked sooner. Performed at [facility], [place] [address] [place], [place] [address]     Blood Alcohol level:  Lab Results  Component Value Date   ETH 132 (H) 03/23/2019   ETH 198 (H) [id number]    Metabolic Disorder Labs:  Lab Results  Component Value Date   HGBA1C 4.9 05/04/2016   MPG 94 05/04/2016   MPG 82 05/03/2014   No results found for: PROLACTIN Lab Results  Component Value Date   CHOL 170 05/04/2016   TRIG 155 (H) 05/04/2016   HDL 65 05/04/2016   CHOLHDL 2.6 05/04/2016   VLDL 31 05/04/2016   LDLCALC 74 05/04/2016   LDLCALC 42 05/03/2014    Current Medications: Current Facility-Administered Medications  Medication Dose Route Frequency Provider Last Rate Last Dose  . acetaminophen (TYLENOL) tablet 650 mg  650 mg Oral Q6H PRN Emmaline Kluver, FNP      . chlordiazePOXIDE (LIBRIUM) capsule 25 mg  25 mg Oral Q6H PRN Emmaline Kluver, FNP      . chlordiazePOXIDE (LIBRIUM) capsule 25 mg  25 mg Oral QID Emmaline Kluver, FNP       Followed by  . [START ON 03/25/2019] chlordiazePOXIDE (LIBRIUM) capsule 25 mg  25 mg Oral TID Emmaline Kluver, FNP       Followed by  . [START ON 03/26/2019] chlordiazePOXIDE (LIBRIUM) capsule 25 mg  25 mg Oral BH-qamhs Emmaline Kluver, FNP       Followed by  . [START ON 03/27/2019] chlordiazePOXIDE (LIBRIUM) capsule 25 mg  25 mg Oral Daily Emmaline Kluver, FNP      . hydrOXYzine (ATARAX/VISTARIL) tablet 25 mg  25 mg Oral Q6H PRN Emmaline Kluver, FNP      . loperamide (IMODIUM) capsule 2-4  mg  2-4 mg Oral PRN Emmaline Kluver, FNP      . magnesium hydroxide (MILK OF MAGNESIA) suspension 30 mL  30 mL Oral Daily PRN Emmaline Kluver, FNP      . [START ON 03/24/2019] multivitamin with minerals tablet 1 tablet  1 tablet Oral Daily Letitia Libra L, FNP      . thiamine (B-1) injection 100 mg  100 mg Intramuscular Once Emmaline Kluver, FNP      . [START ON 03/24/2019] thiamine (VITAMIN B-1) tablet 100 mg  100 mg Oral Daily Emmaline Kluver, FNP       PTA Medications: Medications Prior to Admission  Medication Sig Dispense Refill Last Dose  . gabapentin (NEURONTIN) 300 MG  capsule Take 1 capsule (300 mg total) by mouth 2 (two) times daily. (Patient not taking: Reported on 03/23/2019) 60 capsule 0   . sucralfate (CARAFATE) 1 g tablet Take 1 tablet (1 g total) by mouth 4 (four) times daily -  with meals and at bedtime. (Patient not taking: Reported on 03/23/2019) 120 tablet 0     Musculoskeletal: Strength & Muscle Tone: within normal limits Gait & Station: normal Patient leans: N/A  Psychiatric Specialty Exam: Physical Exam  Constitutional: He is oriented to person, place, and time. He appears well-developed and well-nourished. No distress.  HENT:  Head: Normocephalic and atraumatic.  Right Ear: External ear normal.  Left Ear: External ear normal.  Eyes: Pupils are equal, round, and reactive to light. Right eye exhibits no discharge. Left eye exhibits no discharge. No scleral icterus.  Respiratory: Effort normal. No respiratory distress.  Musculoskeletal: Normal range of motion.  Neurological: He is alert and oriented to person, place, and time.  Skin: He is not diaphoretic.  Psychiatric: His mood appears anxious. Thought content is not paranoid and not delusional. He exhibits a depressed mood. He expresses suicidal ideation. He expresses no homicidal ideation. He expresses no suicidal plans.    Review of Systems  Constitutional: Negative for chills, diaphoresis, fever, malaise/fatigue and weight  loss.  Respiratory: Negative for cough and shortness of breath.   Cardiovascular: Negative for chest pain.  Gastrointestinal: Negative for diarrhea, nausea and vomiting.  Psychiatric/Behavioral: Positive for depression, substance abuse and suicidal ideas. Negative for hallucinations and memory loss. The patient is nervous/anxious and has insomnia.     Blood pressure 128/78, pulse 76, temperature 98.7 F (37.1 C), temperature source Oral, resp. rate 16, SpO2 96 %.There is no height or weight on file to calculate BMI.  General Appearance: Casual  Eye Contact:  Fair  Speech:  Clear and Coherent and Normal Rate  Volume:  Normal  Mood:  Anxious, Depressed, Hopeless and Worthless  Affect:  Congruent and Depressed  Thought Process:  Coherent, Linear and Descriptions of Associations: Intact  Orientation:  Full (Time, Place, and Person)  Thought Content:  Logical and Hallucinations: None  Suicidal Thoughts:  Yes.  without intent/plan  Homicidal Thoughts:  No  Memory:  Immediate;   Good Recent;   Good  Judgement:  Fair  Insight:  Fair  Psychomotor Activity:  Normal  Concentration:  Concentration: Good and Attention Span: Good  Recall:  Good  Fund of Knowledge:  Good  Language:  Good  Akathisia:  Negative  Handed:  Right  AIMS (if indicated):     Assets:  Communication Skills Desire for Improvement Leisure Time Physical Health  ADL's:  Intact  Cognition:  WNL  Sleep:         Treatment Plan Summary: Daily contact with patient to assess and evaluate symptoms and progress in treatment and Medication management  Observation Level/Precautions:  Detox 15 minute checks Laboratory:  CBC Chemistry Profile UDS Psychotherapy: Individual  Medications:   Librium CIWA protocol Consultations:  Peer support/social work Discharge Concerns:  safety Estimated LOS: Other:      Rozetta Nunnery, NP 10/16/20209:51 PM   Attest to NP Note

## 2019-03-23 NOTE — ED Triage Notes (Signed)
03/23/2019  1206  Patient states he is having SI thoughts with no plan. Pt states his support has failed him.Currently not taking medicines.

## 2019-03-23 NOTE — BH Assessment (Addendum)
[facility] Assessment Progress Note  Per Letitia Libra, FNP, this pt is recommended for admission to the [facility] Observation Unit at this time.  Kathalene Frames, RN has assigned pt to [facility] Obs Rm 204.  Pt's nurse, Eustaquio Maize, has been notified.  She agrees to have pt sign Voluntary Admission and Consent for Treatment.  Pt is to be transported by [facility].  Eustaquio Maize will send original documents with pt, and will call report to [phone].  Jalene Mullet, [employer] Coordinator [phone]

## 2019-03-23 NOTE — ED Notes (Signed)
Pt off unit to OBS unit at [facility].  Pt alert, calm, cooperative, no s/s of distress. DC information given to transport for OBS unit. Belongings given to transport for OBS unit. Pt ambulatory of unit, escorted by NT. Pt transported by [facility] [facility].

## 2019-03-23 NOTE — ED Provider Notes (Signed)
[facility] DEPT Provider Note   CSN: [id number] Arrival date & time: 03/23/19  1126     History   Chief Complaint Chief Complaint  Patient presents with  . Suicidal    HPI [name] is a 53 y.o. male with a hx of bipolar disorder, CAD, GERD, & polysubstance abuse (EtOH, marijuana, cocaine) who presents to the ED w/ complaints of SI/HI which has been occurring for about a year, worse over past few days.  Patient states that he does not feel control like he has control of his thoughts or actions.  He states he is having thoughts of hurting others and hurting himself.  No specific plan.  No alleviating or aggravating factors.  He admits to alcohol, marijuana, and cocaine use, most recently used yesterday.  He states he does not use any of the substances daily.  Denies history of alcohol withdrawal. Denies fever, chills, chest pain, dyspnea, or N/V/D. Patient states he is prescribed medicines but has not been taking them for awhile now.      HPI  Past Medical History:  Diagnosis Date  . Abnormal ECG    a. early repolarization  . Arthritis    "my whole left side" (05/03/2014)  . Bipolar disorder ([place])   . Bradycardia    a. asymptomatic  . CAD in native artery    a. Nonobstructive cath 11/2007;  b. Presented with ST elevation - Nonobstructive cath 08/2011  . Chest pain, mid sternal   . Coronary artery disease   . GERD (gastroesophageal reflux disease)   . History of cocaine abuse ([place])    a. quit ? 2009  . History of ETOH abuse    a. drinks 2 "40's" / wk  . Marijuana abuse    a. uses ~ 1x /wk or less  . Pneumonia   . Stomach ulcer   . Syncope    a. 12/2010 - presumed to be vasovagal  . Tobacco abuse     Patient Active Problem List   Diagnosis Date Noted  . MDD (major depressive disorder), recurrent severe, without psychosis ([place]) 02/08/2018  . Major depressive disorder, recurrent episode, severe with anxious distress ([place]) 05/23/2016  .  PTSD (post-traumatic stress disorder) 05/23/2016  . Cocaine use disorder, moderate, in early remission ([place]) 05/23/2016  . Alcohol use disorder, moderate, dependence ([place]) 05/23/2016  . Affective psychosis, bipolar ([place]) 04/30/2016  . Cocaine abuse with cocaine-induced mood disorder ([place]) 03/04/2016  . Alcohol dependence with uncomplicated withdrawal ([place])   . Alcohol use disorder, severe, dependence ([place]) 01/18/2015  . Alcohol dependence with alcohol-induced mood disorder ([place])   . Suicidal ideation   . Acute pancreatitis 03/20/2012  . Polysubstance abuse ([place]) 03/20/2012  . Cannabis abuse 01/12/2012  . Alcohol dependence ([place]) 10/15/2011  . Abnormal ECG   . Chest pain   . CAD in native artery   . GERD (gastroesophageal reflux disease)   . Tobacco abuse   . History of cocaine abuse ([place])   . History of ETOH abuse   . Bradycardia   . Syncope     Past Surgical History:  Procedure Laterality Date  . CARDIAC CATHETERIZATION  2009; 08/2011   [name] 08/06/2011  . LEFT HEART CATHETERIZATION WITH CORONARY ANGIOGRAM N/A 08/16/2011   Procedure: LEFT HEART CATHETERIZATION WITH CORONARY ANGIOGRAM;  Surgeon: [name], MD;  Location: [facility] CATH LAB;  Service: Cardiovascular;  Laterality: N/A;        Home Medications    Prior to  Admission medications   Medication Sig Start Date End Date Taking? Authorizing Provider  acamprosate (CAMPRAL) 333 MG tablet Take 333 mg by mouth 3 (three) times daily. 08/28/18   [provider]  escitalopram (LEXAPRO) 5 MG tablet Take 5 mg by mouth daily. 08/28/18   [provider]  gabapentin (NEURONTIN) 300 MG capsule Take 1 capsule (300 mg total) by mouth 2 (two) times daily. 11/30/18   Connye Burkitt, NP  hydrOXYzine (VISTARIL) 25 MG capsule Take 25 mg by mouth every 6 (six) hours as needed for anxiety or vomiting.  08/28/18   [provider]  pantoprazole (PROTONIX) 20 MG tablet Take 20 mg by mouth daily. 08/28/18   [provider]  sucralfate (CARAFATE) 1 g tablet Take 1 tablet (1 g total) by mouth 4 (four) times daily -  with meals and at bedtime. 01/23/19   Montine Circle, PA-C  traZODone (DESYREL) 50 MG tablet Take 50 mg by mouth at bedtime as needed for sleep.  08/28/18   [provider]    Family History No family history on file.  Social History Social History   Tobacco Use  . Smoking status: Current Some Day Smoker    Packs/day: 0.50    Years: 20.00    Pack years: 10.00    Types: Cigarettes    Last attempt to quit: 06/24/2015    Years since quitting: 3.7  . Smokeless tobacco: Never Used  Substance Use Topics  . Alcohol use: Yes    Alcohol/week: 48.0 standard drinks    Types: 48 Cans of beer per week    Comment: 2 40 oz beers per day  . Drug use: Yes    Types: Marijuana, Cocaine     Allergies   Aspirin, Pepperoni [pickled meat], and Tomato   Review of Systems Review of Systems  Constitutional: Negative for chills and fever.  Respiratory: Negative for shortness of breath.   Cardiovascular: Negative for chest pain.  Gastrointestinal: Negative for abdominal pain, diarrhea, nausea and vomiting.  Neurological: Negative for syncope.  Psychiatric/Behavioral: Positive for suicidal ideas. Negative for hallucinations.       + for HI  All other systems reviewed and are negative.    Physical Exam Updated Vital Signs BP 114/70 (BP Location: Left Arm)   Pulse 69   Temp 98.3 F (36.8 C) (Oral)   Resp 20   Ht 5\' 4"  (1.626 m)   Wt 54.4 kg   BMI 20.60 kg/m   Physical Exam Vitals signs and nursing note reviewed.  Constitutional:      General: He is not in acute distress.    Appearance: He is well-developed. He is not toxic-appearing.  HENT:     Head: Normocephalic and atraumatic.  Eyes:     General:        Right eye: No discharge.        Left eye: No discharge.     Conjunctiva/sclera: Conjunctivae normal.  Neck:     Musculoskeletal: Neck supple.   Cardiovascular:     Rate and Rhythm: Normal rate and regular rhythm.  Pulmonary:     Effort: Pulmonary effort is normal. No respiratory distress.     Breath sounds: Normal breath sounds. No wheezing, rhonchi or rales.  Abdominal:     General: There is no distension.     Palpations: Abdomen is soft.     Tenderness: There is no abdominal tenderness.  Skin:    General: Skin is warm and dry.  Findings: No rash.  Neurological:     Mental Status: He is alert.     Comments: Clear speech.   Psychiatric:        Behavior: Behavior normal.        Thought Content: Thought content includes homicidal and suicidal ideation. Thought content does not include homicidal or suicidal plan.     ED Treatments / Results  Labs (all labs ordered are listed, but only abnormal results are displayed) Labs Reviewed  COMPREHENSIVE METABOLIC PANEL - Abnormal; Notable for the following components:      Result Value   Calcium 8.7 (*)    All other components within normal limits  ACETAMINOPHEN LEVEL - Abnormal; Notable for the following components:   Acetaminophen (Tylenol), Serum <10 (*)    All other components within normal limits  ETHANOL - Abnormal; Notable for the following components:   Alcohol, Ethyl (B) 132 (*)    All other components within normal limits  CBC  SALICYLATE LEVEL  RAPID URINE DRUG SCREEN, HOSP PERFORMED    EKG None  Radiology No results found.  Procedures Procedures (including critical care time)  Medications Ordered in ED Medications  influenza vac split quadrivalent PF (FLUARIX) injection 0.5 mL (has no administration in time range)     Initial Impression / Assessment and Plan / ED Course  I have reviewed the triage vital signs and the nursing notes.  Pertinent labs & imaging results that were available during my care of the patient were reviewed by me and considered in my medical decision making (see chart for details).   Patient presents to the emergency  department with complaints of SI/HI. Patient nontoxic-appearing, no apparent distress, vitals WNL.  Screening labs reviewed and fairly unremarkable.  Mild hypocalcemia, no significant electrolyte derangement.  Ethanol level is elevated.  Patient denies history of withdrawal however will place on CIWA precautionarily.  Patient medically cleared for TTS evaluation.  Disposition per [facility].  Per NP w/ [facility]- plan for [facility] Observation Unit pending COVID testing which has been ordered.   [name] [name] was evaluated in Emergency Department on 03/23/2019 for the symptoms described in the history of present illness. He/she was evaluated in the context of the global COVID-19 pandemic, which necessitated consideration that the patient might be at risk for infection with the SARS-CoV-2 virus that causes COVID-19. Institutional protocols and algorithms that pertain to the evaluation of patients at risk for COVID-19 are in a state of rapid change based on information released by regulatory bodies including the CDC and federal and state organizations. These policies and algorithms were followed during the patient's care in the ED.   Final Clinical Impressions(s) / ED Diagnoses   Final diagnoses:  Suicidal ideation    ED Discharge Orders    None       Amaryllis Dyke, PA-C 03/23/19 1742    Wyvonnia Dusky, MD 03/23/19 1821

## 2019-03-23 NOTE — Progress Notes (Signed)
03/23/2019  1156  Labs drawn. Gold, light green, dark green, red, and purple tubes sent to main lab.

## 2019-03-23 NOTE — Plan of Care (Signed)
[facility] Observation Crisis Plan  Reason for Crisis Plan:  Crisis Stabilization and Substance Abuse   Plan of Care:  Referral for Inpatient Hospitalization and Referral for Substance Abuse  Family Support:    daughter [name] [name])  Current Living Environment:  Living Arrangements: Parent("but they are not supportive, I need to leave from there")  Insurance:   Hospital Account    Name Acct ID Class Status Primary Coverage   [name], [name] [id number] [place] Account (for Hospital Account [id number])    Name Relation to [place]? Acct Type   [name], [name]   Address Phone       [address] [place], [place] [address] [phone])          Coverage Information (for Hospital Account [id number])    F/O Payor/Plan Precert #   [facility] MEDICAID/[place] MEDICA[place]    Subscriber Subscriber #   [name], [name] [id number]   Address Phone   [address] [place], [place] [address] [phone]      Legal Guardian:  Legal Guardian: Other:(self)  Primary Care Provider:  [name], MD  Current Outpatient Providers:  Dr. Sheryle [name] (Medical)  Psychiatrist:   None at present  Counselor/Therapist:   None at present  Compliant with Medications:  No  Additional Information: Per pt "I don't take any mental health medications right now".   [name] [name] 10/16/20208:18 PM
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
## 2019-03-24 ENCOUNTER — Inpatient Hospital Stay (HOSPITAL_COMMUNITY)
Admission: AD | Admit: 2019-03-24 | Discharge: 2019-03-28 | DRG: 897 | Disposition: A | Discharge status: Home / Self Care | Payer: Medicaid Other | Source: Intra-hospital | Attending: Psychiatry | Admitting: Psychiatry

## 2019-03-24 DIAGNOSIS — R45851 Suicidal ideations: Secondary | ICD-10-CM | POA: Diagnosis present

## 2019-03-24 DIAGNOSIS — F319 Bipolar disorder, unspecified: Secondary | ICD-10-CM | POA: Diagnosis not present

## 2019-03-24 DIAGNOSIS — F1024 Alcohol dependence with alcohol-induced mood disorder: Principal | ICD-10-CM

## 2019-03-24 DIAGNOSIS — Z20828 Contact with and (suspected) exposure to other viral communicable diseases: Secondary | ICD-10-CM | POA: Diagnosis present

## 2019-03-24 DIAGNOSIS — F332 Major depressive disorder, recurrent severe without psychotic features: Secondary | ICD-10-CM | POA: Diagnosis present

## 2019-03-24 DIAGNOSIS — K219 Gastro-esophageal reflux disease without esophagitis: Secondary | ICD-10-CM | POA: Diagnosis present

## 2019-03-24 DIAGNOSIS — F1410 Cocaine abuse, uncomplicated: Secondary | ICD-10-CM | POA: Diagnosis present

## 2019-03-24 DIAGNOSIS — G4700 Insomnia, unspecified: Secondary | ICD-10-CM | POA: Diagnosis present

## 2019-03-24 DIAGNOSIS — F17210 Nicotine dependence, cigarettes, uncomplicated: Secondary | ICD-10-CM | POA: Diagnosis present

## 2019-03-24 DIAGNOSIS — Y906 Blood alcohol level of 120-199 mg/100 ml: Secondary | ICD-10-CM | POA: Diagnosis present

## 2019-03-24 MED ORDER — PANTOPRAZOLE SODIUM 40 MG PO TBEC
40.0000 mg | DELAYED_RELEASE_TABLET | Freq: Every day | ORAL | Status: DC
  Administered 2019-03-24 – 2019-03-28 (×5): 40 mg via ORAL
  Filled 2019-03-24 (×9): qty 1

## 2019-03-24 MED ORDER — LOPERAMIDE HCL 2 MG PO CAPS: 2.0000 mg | ORAL_CAPSULE | ORAL | Status: AC | PRN

## 2019-03-24 MED ORDER — HYDROXYZINE HCL 25 MG PO TABS: 25.0000 mg | ORAL_TABLET | Freq: Four times a day (QID) | ORAL | Status: AC | PRN

## 2019-03-24 MED ORDER — ALUM & MAG HYDROXIDE-SIMETH 200-200-20 MG/5ML PO SUSP: 30.0000 mL | ORAL | Status: DC | PRN

## 2019-03-24 MED ORDER — ACETAMINOPHEN 325 MG PO TABS
650.0000 mg | ORAL_TABLET | Freq: Four times a day (QID) | ORAL | Status: DC | PRN
  Administered 2019-03-24 – 2019-03-25 (×2): 650 mg via ORAL
  Filled 2019-03-24 (×2): qty 2

## 2019-03-24 MED ORDER — LORAZEPAM 1 MG PO TABS
1.0000 mg | ORAL_TABLET | Freq: Four times a day (QID) | ORAL | Status: AC | PRN
  Filled 2019-03-24: qty 1

## 2019-03-24 MED ORDER — ADULT MULTIVITAMIN W/MINERALS CH
1.0000 | ORAL_TABLET | Freq: Every day | ORAL | Status: DC
  Administered 2019-03-25 – 2019-03-28 (×4): 1 via ORAL
  Filled 2019-03-24 (×9): qty 1

## 2019-03-24 MED ORDER — VITAMIN B-1 100 MG PO TABS
100.0000 mg | ORAL_TABLET | Freq: Every day | ORAL | Status: DC
  Administered 2019-03-25 – 2019-03-28 (×4): 100 mg via ORAL
  Filled 2019-03-24 (×7): qty 1

## 2019-03-24 MED ORDER — TRAZODONE HCL 50 MG PO TABS: 50.0000 mg | ORAL_TABLET | Freq: Every evening | ORAL | Status: DC | PRN

## 2019-03-24 MED ORDER — MAGNESIUM HYDROXIDE 400 MG/5ML PO SUSP: 30.0000 mL | Freq: Every day | ORAL | Status: DC | PRN

## 2019-03-24 MED ORDER — THIAMINE HCL 100 MG/ML IJ SOLN
100.0000 mg | Freq: Once | INTRAMUSCULAR | Status: DC
  Filled 2019-03-24: qty 2

## 2019-03-24 MED ORDER — ESCITALOPRAM OXALATE 5 MG PO TABS
5.0000 mg | ORAL_TABLET | Freq: Every day | ORAL | Status: DC
  Administered 2019-03-25: 5 mg via ORAL
  Filled 2019-03-24 (×5): qty 1

## 2019-03-24 MED ORDER — TRAZODONE HCL 50 MG PO TABS
50.0000 mg | ORAL_TABLET | Freq: Every evening | ORAL | Status: DC | PRN
  Administered 2019-03-27: 50 mg via ORAL
  Filled 2019-03-24 (×3): qty 1

## 2019-03-24 MED ORDER — GABAPENTIN 100 MG PO CAPS
100.0000 mg | ORAL_CAPSULE | Freq: Three times a day (TID) | ORAL | Status: DC
  Administered 2019-03-24 – 2019-03-25 (×3): 100 mg via ORAL
  Filled 2019-03-24 (×9): qty 1

## 2019-03-24 MED ORDER — HYDROXYZINE HCL 25 MG PO TABS: 25.0000 mg | ORAL_TABLET | Freq: Three times a day (TID) | ORAL | Status: DC | PRN

## 2019-03-24 MED ORDER — GABAPENTIN 300 MG PO CAPS: 300.0000 mg | ORAL_CAPSULE | Freq: Two times a day (BID) | ORAL | Status: DC

## 2019-03-24 MED ORDER — ONDANSETRON 4 MG PO TBDP
4.0000 mg | ORAL_TABLET | Freq: Four times a day (QID) | ORAL | Status: AC | PRN
  Administered 2019-03-26 – 2019-03-27 (×2): 4 mg via ORAL
  Filled 2019-03-24 (×2): qty 1

## 2019-03-24 NOTE — Progress Notes (Signed)
Pt A & O X4. Presents with sullen affect, depressed mood, fidgety and tearful on interactions this AM. Denies HI, AVH and pain at this time. Endorsed passive SI, verbally contracts for safety. Appetite remains poor "I just took couple bites, I'm alright". Reports fair sleep last night "I was sweating and just restless". Cooperative with care. Compliant with medications as scheduled. Emotional support and availability provided to pt. Encouraged pt to express concerns. Safety checks maintained without self harm gestures or outburst to note thus far.

## 2019-03-24 NOTE — Progress Notes (Signed)
Adult Psychoeducational Group Note  Date:  03/24/2019 Time:  8:37 PM  Group Topic/Focus:  Wrap-Up Group:   The focus of this group is to help patients review their daily goal of treatment and discuss progress on daily workbooks.  Participation Level:  Did Not Attend sleeping  Participation Quality:  did not attend  Affect:  did not attend  Cognitive:  did not attend  Insight: None  Engagement in Group:  did not attend  Modes of Intervention:  did not attend  Additional Comments:  Did not attend sleeping  Lenice Llamas Long 03/24/2019, 8:37 PM

## 2019-03-24 NOTE — Progress Notes (Signed)
[place] NOVEL CORONAVIRUS (COVID-19) DAILY CHECK-OFF SYMPTOMS - answer yes or no to each - every day NO YES  Have you had a fever in the past 24 hours?  . Fever (Temp > 37.80C / 100F) X   Have you had any of these symptoms in the past 24 hours? . New Cough .  Sore Throat  .  Shortness of Breath .  Difficulty Breathing .  Unexplained Body Aches   X   Have you had any one of these symptoms in the past 24 hours not related to allergies?   . Runny Nose .  Nasal Congestion .  Sneezing   X   If you have had runny nose, nasal congestion, sneezing in the past 24 hours, has it worsened?  X   EXPOSURES - check yes or no X   Have you traveled outside the state in the past 14 days?  X   Have you been in contact with someone with a confirmed diagnosis of COVID-19 or PUI in the past 14 days without wearing appropriate PPE?  X   Have you been living in the same home as a person with confirmed diagnosis of COVID-19 or a PUI (household contact)?    X   Have you been diagnosed with COVID-19?    X              What to do next: Answered NO to all: Answered YES to anything:   Proceed with unit schedule Follow the [facility] Inpatient Flowsheet.   

## 2019-03-24 NOTE — H&P (Addendum)
Behavioral Health Medical Screening Exam  [name] [name] is an 53 y.o. male.  On evaluation today, he presents with suicidal thoughts and polysubstance abuse. He presents with increased anxiety, depression, and suicidal thoughts with a plan to jump off a bridge. He endorses significant history of personal loss of most recent was his male friend whom he relates passed away one week prior.  He endorses minimal social support and decreased protective factors.  He makes minimal eye contact with writer and requests inpatient treatment for both his alcohol and depressive concerns.     Total Time spent with patient: 30 minutes  Psychiatric Specialty Exam: Physical Exam  Constitutional: He is oriented to person, place, and time. He appears well-developed.  HENT:  Head: Normocephalic.  Neck: Normal range of motion.  Musculoskeletal: Normal range of motion.  Neurological: He is alert and oriented to person, place, and time.    Review of Systems  Psychiatric/Behavioral: Positive for depression (present on assessment), substance abuse (on admission + for cocaine, thc and BAL 132) and suicidal ideas (with plan to jump off a bridge).    Blood pressure 110/75, pulse 68, temperature 98 F (36.7 C), temperature source Oral, resp. rate 16, SpO2 98 %.There is no height or weight on file to calculate BMI.  General Appearance: Casual and wearing hospital scrubs  Eye Contact:  Minimal  Speech:  Clear and Coherent  Volume:  Normal  Mood:  Anxious, Depressed, Hopeless and Worthless  Affect:  Congruent and Depressed  Thought Process:  Coherent and Descriptions of Associations: Intact  Orientation:  Full (Time, Place, and Person)  Thought Content:  Illogical  Suicidal Thoughts:  Yes.  with intent/plan  Homicidal Thoughts:  No  Memory:  Immediate;   Good Recent;   Good  Judgement:  Fair  Insight:  Fair  Psychomotor Activity:  Normal  Concentration: Concentration: Fair and Attention Span: Fair  Recall:   Good  Fund of Knowledge:Good  Language: Good  Akathisia:  No  Handed:  Right  AIMS (if indicated):     Assets:  Communication Skills Desire for Improvement  Sleep:       Musculoskeletal: Strength & Muscle Tone: within normal limits Gait & Station: normal Patient leans: patient sitting on bed; did not assess  Blood pressure 110/75, pulse 68, temperature 98 F (36.7 C), temperature source Oral, resp. rate 16, SpO2 98 %.  Recommendations:  Recommend psychiatric inpatient admission when medically cleared. Admit to [address] for  major depression,recurrent severe, substance abuse disorder, crisis management, and medication stabilization.  -Routine labs; which include CBC, CMP, UA, ETOH, and UDS were reviewed   -Medication management:   Continue CIWA protocol for alcohol use disorder/substance abuse withdrawal  Start: Gabapentin 300 mg tab po two time daily      Sleep Concerns:  Trazodone 50 mg tab po hs  Vitamin deficiency Thiamine 100 mg tab po daily  -Will maintain observation checks every 15 minutes for safety. -Psychosocial education regarding relapse prevention and self-care; Social and communication  -Social work will consult with family for collateral information and discuss discharge and follow up plan.   Mallie Darting, NP 03/24/2019, 12:43 PM  Patient seen face-to-face for psychiatric evaluation, chart reviewed and case discussed with the physician extender and developed treatment plan. Reviewed the information documented and agree with the treatment plan. Corena Pilgrim, MD

## 2019-03-24 NOTE — H&P (Addendum)
Patient continues to meet inpatient criteria and voluntarily accepts admission.  Recommed [address].   Patient seen face-to-face for psychiatric evaluation, chart reviewed and case discussed with the physician extender and developed treatment plan. Reviewed the information documented and agree with the treatment plan. Corena Pilgrim, MD
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
## 2019-03-24 NOTE — Progress Notes (Signed)
Pt transferred to [facility] Adult unit per MD's order. Report called to receiving nurse Mateo Flow, RN. Pt escorted to search room by Probation officer. Ambulatory with a steady gait, appears to be in no physical distress at time of transfer. Maintained on Q 15 minutes safety checks without incident.

## 2019-03-25 LAB — LIPID PANEL
Cholesterol: 208 mg/dL — ABNORMAL HIGH (ref 0–200)
HDL: 91 mg/dL (ref >40)
LDL Cholesterol: 98 mg/dL (ref 0–99)
Total CHOL/HDL Ratio: 2.3 RATIO
Triglycerides: 97 mg/dL (ref <150)
VLDL: 19 mg/dL (ref 0–40)

## 2019-03-25 LAB — TSH: TSH: 1.095 u[IU]/mL (ref 0.350–4.500)

## 2019-03-25 MED ORDER — IBUPROFEN 800 MG PO TABS
800.0000 mg | ORAL_TABLET | Freq: Once | ORAL | Status: AC | PRN
  Administered 2019-03-25: 800 mg via ORAL
  Filled 2019-03-25: qty 1

## 2019-03-25 MED ORDER — ESCITALOPRAM OXALATE 10 MG PO TABS
10.0000 mg | ORAL_TABLET | Freq: Every day | ORAL | Status: DC
  Administered 2019-03-26 – 2019-03-28 (×3): 10 mg via ORAL
  Filled 2019-03-25 (×6): qty 1

## 2019-03-25 MED ORDER — GABAPENTIN 100 MG PO CAPS
200.0000 mg | ORAL_CAPSULE | Freq: Three times a day (TID) | ORAL | Status: DC
  Administered 2019-03-25 – 2019-03-28 (×7): 200 mg via ORAL
  Filled 2019-03-25 (×14): qty 2

## 2019-03-25 NOTE — Progress Notes (Signed)
D. Pt presents with a depressed affect- smiles and is polite during interactions. Pt remained in bed for much of the shift- reports not having an appetite.- Pt currently denies SI/HI and AVH  A. Labs and vitals monitored. Pt compliant with medications. Pt supported emotionally and encouraged to express concerns and ask questions.   R. Pt remains safe with 15 minute checks. Will continue POC.
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
## 2019-03-25 NOTE — [facility] Group Notes (Signed)
[facility] LCSW Group Therapy Note  03/25/2019  10:00-11:00AM  Type of Therapy and Topic:  Group Therapy:  A Hero Worthy of Support  Participation Level:  Active   Description of Group:  Patients in this group were introduced to the concept that additional supports including self-support are an essential part of recovery.  Matching needs with supports to help fulfill those needs was explained.  A song "I Got To Live" was played for the group and was followed by a discussion of what it meant to participants.   The consensus was that the message was to give themselves permission to see happiness in life.  A song entitled "My Own Hero" was played and a group discussion ensued in which patients stated it inspired them to help themselves in order to succeed, because other people cannot achieve their goals such as sobriety or stability for them.  A song was played called "I Am Enough" which led to a discussion about being willing to believe we are worth the effort of being a self-support.   Therapeutic Goals: 1)  demonstrate the importance of being a key part of one's own support system 2)  discuss various available supports 3)  encourage patient to use music as part of their self-support and focus on goals 4)  elicit ideas from patients about supports that need to be added   Summary of Patient Progress:  The patient expressed a great deal of understanding with the songs played, sharing that he is having trouble with his supports because they do not understand his grief over the loss of first his wife and now his fiancee.  He stated, "I would rather be dead" multiple times during group, while also talking about his 15 grandchildren and his love for them.  He was tearful throughout group, attentive to others when they were talking, and accepting of words addressed to him specifically.   Therapeutic Modalities:   Motivational Interviewing Activity  Maretta Los

## 2019-03-25 NOTE — Progress Notes (Signed)
[place] NOVEL CORONAVIRUS (COVID-19) DAILY CHECK-OFF SYMPTOMS - answer yes or no to each - every day NO YES  Have you had a fever in the past 24 hours?  . Fever (Temp > 37.80C / 100F) X   Have you had any of these symptoms in the past 24 hours? . New Cough .  Sore Throat  .  Shortness of Breath .  Difficulty Breathing .  Unexplained Body Aches   X   Have you had any one of these symptoms in the past 24 hours not related to allergies?   . Runny Nose .  Nasal Congestion .  Sneezing   X   If you have had runny nose, nasal congestion, sneezing in the past 24 hours, has it worsened?  X   EXPOSURES - check yes or no X   Have you traveled outside the state in the past 14 days?  X   Have you been in contact with someone with a confirmed diagnosis of COVID-19 or PUI in the past 14 days without wearing appropriate PPE?  X   Have you been living in the same home as a person with confirmed diagnosis of COVID-19 or a PUI (household contact)?    X   Have you been diagnosed with COVID-19?    X              What to do next: Answered NO to all: Answered YES to anything:   Proceed with unit schedule Follow the [facility] Inpatient Flowsheet.   
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
## 2019-03-26 ENCOUNTER — Other Ambulatory Visit: Payer: Self-pay

## 2019-03-26 LAB — HEMOGLOBIN A1C
Hgb A1c MFr Bld: 5 % (ref 4.8–5.6)
Mean Plasma Glucose: 97 mg/dL

## 2019-03-26 NOTE — Tx Team (Signed)
Interdisciplinary Treatment and Diagnostic Plan Update  03/26/2019 Time of Session: 9:00am [name] [name] MRN: [id number]  Principal Diagnosis: <principal problem not specified>  Secondary Diagnoses: Active Problems:   MDD (major depressive disorder), recurrent episode, severe ([facility])   Current Medications:  Current Facility-Administered Medications  Medication Dose Route Frequency Provider Last Rate Last Dose  . acetaminophen (TYLENOL) tablet 650 mg  650 mg Oral Q6H PRN Connye Burkitt, NP   650 mg at 03/25/19 [id number]  . alum & mag hydroxide-simeth (MAALOX/MYLANTA) 200-200-20 MG/5ML suspension 30 mL  30 mL Oral Q4H PRN Connye Burkitt, NP      . escitalopram (LEXAPRO) tablet 10 mg  10 mg Oral Daily Cobos, Myer Peer, MD   10 mg at 03/26/19 0855  . gabapentin (NEURONTIN) capsule 200 mg  200 mg Oral TID Cobos, Myer Peer, MD   200 mg at 03/26/19 0854  . hydrOXYzine (ATARAX/VISTARIL) tablet 25 mg  25 mg Oral Q6H PRN Cobos, Myer Peer, MD      . loperamide (IMODIUM) capsule 2-4 mg  2-4 mg Oral PRN Cobos, Myer Peer, MD      . LORazepam (ATIVAN) tablet 1 mg  1 mg Oral Q6H PRN Cobos, Myer Peer, MD      . magnesium hydroxide (MILK OF MAGNESIA) suspension 30 mL  30 mL Oral Daily PRN Connye Burkitt, NP      . multivitamin with minerals tablet 1 tablet  1 tablet Oral Daily Cobos, Myer Peer, MD   1 tablet at 03/26/19 0855  . ondansetron (ZOFRAN-ODT) disintegrating tablet 4 mg  4 mg Oral Q6H PRN Cobos, Myer Peer, MD   4 mg at 03/26/19 0352  . pantoprazole (PROTONIX) EC tablet 40 mg  40 mg Oral Daily Cobos, Myer Peer, MD   40 mg at 03/26/19 0856  . thiamine (B-1) injection 100 mg  100 mg Intramuscular Once Cobos, Fernando A, MD      . thiamine (VITAMIN B-1) tablet 100 mg  100 mg Oral Daily Cobos, Myer Peer, MD   100 mg at 03/26/19 0856  . traZODone (DESYREL) tablet 50 mg  50 mg Oral QHS PRN Cobos, Myer Peer, MD       PTA Medications: Medications Prior to Admission  Medication Sig Dispense Refill  Last Dose  . gabapentin (NEURONTIN) 300 MG capsule Take 1 capsule (300 mg total) by mouth 2 (two) times daily. (Patient not taking: Reported on 03/23/2019) 60 capsule 0   . sucralfate (CARAFATE) 1 g tablet Take 1 tablet (1 g total) by mouth 4 (four) times daily -  with meals and at bedtime. (Patient not taking: Reported on 03/23/2019) 120 tablet 0     Patient Stressors:    Patient Strengths:    Treatment Modalities: Medication Management, Group therapy, Case management,  1 to 1 session with clinician, Psychoeducation, Recreational therapy.   Physician Treatment Plan for Primary Diagnosis: <principal problem not specified> Long Term Goal(s): Improvement in symptoms so as ready for discharge Improvement in symptoms so as ready for discharge   Short Term Goals: Ability to identify changes in lifestyle to reduce recurrence of condition will improve Ability to identify triggers associated with substance abuse/mental health issues will improve Ability to identify changes in lifestyle to reduce recurrence of condition will improve Ability to verbalize feelings will improve Ability to disclose and discuss suicidal ideas Ability to demonstrate self-control will improve Ability to identify and develop effective coping behaviors will improve Ability to maintain clinical measurements within normal limits  will improve  Medication Management: Evaluate patient's response, side effects, and tolerance of medication regimen.  Therapeutic Interventions: 1 to 1 sessions, Unit Group sessions and Medication administration.  Evaluation of Outcomes: Progressing  Physician Treatment Plan for Secondary Diagnosis: Active Problems:   MDD (major depressive disorder), recurrent episode, severe ([place])  Long Term Goal(s): Improvement in symptoms so as ready for discharge Improvement in symptoms so as ready for discharge   Short Term Goals: Ability to identify changes in lifestyle to reduce recurrence of  condition will improve Ability to identify triggers associated with substance abuse/mental health issues will improve Ability to identify changes in lifestyle to reduce recurrence of condition will improve Ability to verbalize feelings will improve Ability to disclose and discuss suicidal ideas Ability to demonstrate self-control will improve Ability to identify and develop effective coping behaviors will improve Ability to maintain clinical measurements within normal limits will improve     Medication Management: Evaluate patient's response, side effects, and tolerance of medication regimen.  Therapeutic Interventions: 1 to 1 sessions, Unit Group sessions and Medication administration.  Evaluation of Outcomes: Progressing   RN Treatment Plan for Primary Diagnosis: <principal problem not specified> Long Term Goal(s): Knowledge of disease and therapeutic regimen to maintain health will improve  Short Term Goals: Ability to disclose and discuss suicidal ideas, Ability to identify and develop effective coping behaviors will improve and Compliance with prescribed medications will improve  Medication Management: RN will administer medications as ordered by provider, will assess and evaluate patient's response and provide education to patient for prescribed medication. RN will report any adverse and/or side effects to prescribing provider.  Therapeutic Interventions: 1 on 1 counseling sessions, Psychoeducation, Medication administration, Evaluate responses to treatment, Monitor vital signs and CBGs as ordered, Perform/monitor CIWA, COWS, AIMS and Fall Risk screenings as ordered, Perform wound care treatments as ordered.  Evaluation of Outcomes: Progressing   LCSW Treatment Plan for Primary Diagnosis: <principal problem not specified> Long Term Goal(s): Safe transition to appropriate next level of care at discharge, Engage patient in therapeutic group addressing interpersonal concerns.  Short  Term Goals: Engage patient in aftercare planning with referrals and resources, Increase social support, Increase emotional regulation, Identify triggers associated with mental health/substance abuse issues and Increase skills for wellness and recovery  Therapeutic Interventions: Assess for all discharge needs, 1 to 1 time with Social worker, Explore available resources and support systems, Assess for adequacy in community support network, Educate family and significant other(s) on suicide prevention, Complete Psychosocial Assessment, Interpersonal group therapy.  Evaluation of Outcomes: Progressing   Progress in Treatment: Attending groups: Yes. Participating in groups: Yes. Taking medication as prescribed: Yes. Toleration medication: Yes. Family/Significant other contact made: No, will contact:  daughter, Kristine Garbe Patient understands diagnosis: Yes. Discussing patient identified problems/goals with staff: Yes. Medical problems stabilized or resolved: Yes. Denies suicidal/homicidal ideation: No. Issues/concerns per patient self-inventory: Yes.  New problem(s) identified: Yes, Describe:  family stressors, grief, limited income  New Short Term/Long Term Goal(s): detox, medication management for mood stabilization; elimination of SI thoughts; development of comprehensive mental wellness/sobriety plan.  Patient Goals:  "I want to be independent and live a life of my own."   Discharge Plan or Barriers: Patient wants to go to residential substance use treatment. CSW assessing for appropriate referrals.   Reason for Continuation of Hospitalization: Anxiety Depression Medication stabilization Suicidal ideation  Estimated Length of Stay: 3-5 days  Attendees:   Patient: [name] [name] 03/26/2019 9:27 AM  Physician: Larene Beach 03/26/2019 9:27 AM  Nursing: Joellen Jersey 03/26/2019 9:27 AM  RN Care Manager: 03/26/2019 9:27 AM  Social Worker: Stephanie Acre, [place] 03/26/2019 9:27 AM  Recreational  Therapist:  03/26/2019 9:27 AM  Other: Harriett Sine, NP  03/26/2019 9:27 AM  Other:  03/26/2019 9:27 AM  Other: 03/26/2019 9:27 AM    Scribe for Treatment Team: Joellen Jersey, [place] 03/26/2019 9:35 AM

## 2019-03-26 NOTE — [facility] Suicide Risk Assessment (Signed)
[place] INPATIENT:  Family/Significant Other Suicide Prevention Education  Suicide Prevention Education:  Contact Attempts: daughter Vallie Armor [phone]  has been identified by the patient as the family member/significant other with whom the patient will be residing, and identified as the person(s) who will aid the patient in the event of a mental health crisis.  With written consent from the patient, two attempts were made to provide suicide prevention education, prior to and/or following the patient's discharge.  We were unsuccessful in providing suicide prevention education.  A suicide education pamphlet was given to the patient to share with family/significant other.  Date and time of first attempt: 03/26/2019  CSW called daughter, Kristine Garbe, at 10:30am and introduced self, role, and purpose of the call. Daughter asked CSW to call back at 11:00am. CSW called back at 11:03am and the daughter did not answer. No option to leave a voicemail.  Joellen Jersey 03/26/2019, 11:04 AM

## 2019-03-26 NOTE — [facility] Group Notes (Signed)
LCSW Group Therapy Note 03/26/2019 1:13 PM  Type of Therapy and Topic: Group Therapy: Overcoming Obstacles  Participation Level: Did Not Attend  Description of Group:  In this group patients will be encouraged to explore what they see as obstacles to their own wellness and recovery. They will be guided to discuss their thoughts, feelings, and behaviors related to these obstacles. The group will process together ways to cope with barriers, with attention given to specific choices patients can make. Each patient will be challenged to identify changes they are motivated to make in order to overcome their obstacles. This group will be process-oriented, with patients participating in exploration of their own experiences as well as giving and receiving support and challenge from other group members.  Therapeutic Goals: 1. Patient will identify personal and current obstacles as they relate to admission. 2. Patient will identify barriers that currently interfere with their wellness or overcoming obstacles.  3. Patient will identify feelings, thought process and behaviors related to these barriers. 4. Patient will identify two changes they are willing to make to overcome these obstacles:   Summary of Patient Progress  Invited, chose not to attend.    Therapeutic Modalities:  Cognitive Behavioral Therapy Solution Focused Therapy Motivational Interviewing Relapse Prevention Therapy   Theresa Duty Clinical Social Worker

## 2019-03-26 NOTE — Progress Notes (Signed)
D:  Patient's self inventory sheet, patient sleeps good, no sleep medication.  Poor appetite, low energy level, poor concentration.  Rated depression 6, hopeless 5, anxiety 7.  Withdrawals.  Denied SI.  Denied physical problems.  Back, pain medicine helpful.  Goal is stay focused, trying to eat to take medications.  Plans to attend meeting, talk to SW.  Does have discharge plans. A:  Medications administered per MD orders.  Emotional support and encouragement given patient. R:  Denied SI and HI, contracts for safety.  Denied A/V hallucinations.  Safety maintained with 15 minute checks.

## 2019-03-26 NOTE — Progress Notes (Signed)
EKG completed per MD order.  

## 2019-03-26 NOTE — Care Management (Signed)
CMA sent referral to [facility] will follow up.   CMA will notify CSW.     Thanh Pomerleau Care Management Assistant  Email:[email or web address] Office: [phone]

## 2019-03-26 NOTE — Progress Notes (Addendum)
Spiritual care group on grief and loss facilitated by chaplain Jerene Pitch MDiv, B[facility]  Group Goal:  Support / Education around grief and loss Members engage in facilitated group support and psycho-social education.  Group Description:  Following introductions and group rules, group members engaged in facilitated group dialog and support around topic of loss, with particular support around experiences of loss in their lives. Group Identified types of loss (relationships / self / things) and identified patterns, circumstances, and changes that precipitate losses. Reflected on thoughts / feelings around loss, normalized grief responses, and recognized variety in grief experience. Patient Progress:  [name] [name] was present throughout group.  Noted that he grieves the loss of his fiance to death and processed with the group the feeling that he "should" have been there - though he was working many miles away.  Dwayn stated that his family / living situation has been challenging for him and he is hopeful to not have to go back to living with his parents.  He states they do not hold high regard for him and this it has been a challenging environment to keep his self-esteem up.

## 2019-03-26 NOTE — Progress Notes (Signed)
Recreation Therapy Notes  Date:  10.19.20 Time: 0930 Location: [address] Group Room  Group Topic: Stress Management  Goal Area(s) Addresses:  Patient will identify positive stress management techniques. Patient will identify benefits of using stress management post d/c.  Intervention: Stress Management  Activity :  Meditation.  LRT played a meditation that focused on making the most of your day and viewing each moment as a new start.  Patients were to follow along as meditation was played to fully engage in group.  Education:  Stress Management, Discharge Planning.   Education Outcome: Acknowledges Education  Clinical Observations/Feedback:  Pt did not attend activity.    Victorino Sparrow, LRT/CTRS         Ria Comment, Carlissa Pesola A 03/26/2019 11:15 AM
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
## 2019-03-27 DIAGNOSIS — F332 Major depressive disorder, recurrent severe without psychotic features: Secondary | ICD-10-CM

## 2019-03-27 NOTE — Progress Notes (Signed)
Patient denies SI, HI and AVH.  Patient has had no incidents of behavioral dsycontrol this shift.  Patient has been compliant with all medications, attended groups and engaged in unit activities.   Assess patient for safety, offer medications as prescribed, engage patient in 1:1 therapeutic staff talks.   Continue to monitor patient as planned.  Patient able to contract for safety.

## 2019-03-27 NOTE — Progress Notes (Signed)
Patient ID: [name] [name], male   DOB: 1966/01/17, 53 y.o.   MRN: [id number] D: Patient observed in his room. Pt appears calm and cooperative. Pt mood and affect appears depressed and flat.  Denies  SI/HI/AVH and pain.No behavioral issues noted.  A: Support and encouragement offered as needed to express needs.   R: Patient is safe and cooperative on unit. Will continue to monitor  for safety and stability.

## 2019-03-27 NOTE — Progress Notes (Signed)
Supervisor met with patient to discuss housing.  Pt has $775/month SSI income and medicaid.  Pt willing to accept shelter placement and we discussed permanent housing as a goal as well.  Pt signed consent for [facility] and referral made to 211 to coordinate connection with [employer] and [employer].  Pt also called [facility] and left message.  Supervisor received call back from [facility] stating no shelter beds today but they will call back with update tomorrow. Winferd Humphrey, MSW, LCSW Advanced Care Supervisor 03/27/2019 1:11 PM

## 2019-03-27 NOTE — Progress Notes (Signed)
Recreation Therapy Notes  Animal-Assisted Activity (AAA) Program Checklist/Progress Notes Patient Eligibility Criteria Checklist & Daily Group note for Rec Tx Intervention  Date: 10.20.20 Time: 64 Location: [address]   AAA/T Program Assumption of Risk Form signed by Teacher, music or Parent Legal Guardian  YES   Patient is free of allergies or sever asthma  YES   Patient reports no fear of animals  YES   Patient reports no history of cruelty to animals  YES   Patient understands his/her participation is voluntary  YES  Patient washes hands before animal contact  YES  Patient washes hands after animal contact  YES   Education: Contractor, Appropriate Animal Interaction   Education Outcome: Acknowledges understanding/In group clarification offered/Needs additional education.   Clinical Observations/Feedback:  Pt did not attend group activity.     Victorino Sparrow, LRT/CTRS         Victorino Sparrow A 03/27/2019 3:26 PM

## 2019-03-27 NOTE — Care Management (Signed)
CMA attempted to contact June in Admissions with [facility]. June is not in the office today. CMA will provide the contact information for patient    [facility] Management Assistant  Email:[email or web address] Office: [phone]

## 2019-03-27 NOTE — Progress Notes (Addendum)
CSW met with patient at bedside to discuss referrals and discharge planning in anticipation of discharge within the next 24-28 hours.   CSW discussed [facility]'s requirement regarding 10 days of medication compliance. Patient endorses that when he went to [facility] last, he was not taking all of his prescribed medications and states this was due to side effects. Patient states he feels his current medications are working well for him now, he states he is "eating well and sleeping good," he initially denied SI and HI.  Later in the conversation, patient expressed that he felt "agitated" and in regard to direct questioning about SI and Hi patient states "if I'm discharged tomorrow, I don't know what I'm capable of." Patient endorses that his stressors are primarily related to his living situation.  Patient was provided the contact information for [facility] and encouraged to call and ask for a bed to be held for him at discharge.  Patient later approached CSW at the nurses desk and patient requested to be discharged today. He states "being here isn't helping knowing I will go tomorrow." CSW inquired what patient's plan would be if he discharges today, patient states he will "figure it out."   Patient was also provided a list of [place] for [facility] and encouraged to call.  Stephanie Acre, LCSW-A Clinical Social Worker
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
## 2019-03-27 NOTE — [facility] Suicide Risk Assessment (Signed)
[place] INPATIENT:  Family/Significant Other Suicide Prevention Education  Suicide Prevention Education:  Contact Attempts: daughter Juandedios Trude [phone]  has been identified by the patient as the family member/significant other with whom the patient will be residing, and identified as the person(s) who will aid the patient in the event of a mental health crisis.  With written consent from the patient, two attempts were made to provide suicide prevention education, prior to and/or following the patient's discharge.  We were unsuccessful in providing suicide prevention education.  A suicide education pamphlet was given to the patient to share with family/significant other.  Date and time of first attempt: 03/26/2019  CSW called daughter, Kristine Garbe, at 10:30am and introduced self, role, and purpose of the call. Daughter asked CSW to call back at 11:00am. CSW called back at 11:03am and the daughter did not answer. No option to leave a voicemail.  Date and time of second attempt: 03/27/2019 at 1:55pm  Joellen Jersey 03/27/2019, 1:59 PM

## 2019-03-28 DIAGNOSIS — F1994 Other psychoactive substance use, unspecified with psychoactive substance-induced mood disorder: Secondary | ICD-10-CM

## 2019-03-28 DIAGNOSIS — F1020 Alcohol dependence, uncomplicated: Secondary | ICD-10-CM

## 2019-03-28 MED ORDER — PANTOPRAZOLE SODIUM 40 MG PO TBEC: 40.0000 mg | DELAYED_RELEASE_TABLET | Freq: Every day | ORAL | 0 refills | Status: DC

## 2019-03-28 MED ORDER — TRAZODONE HCL 50 MG PO TABS: 50.0000 mg | ORAL_TABLET | Freq: Every evening | ORAL | 0 refills | Status: DC | PRN

## 2019-03-28 MED ORDER — ESCITALOPRAM OXALATE 10 MG PO TABS: 10.0000 mg | ORAL_TABLET | Freq: Every day | ORAL | 0 refills | Status: DC

## 2019-03-28 MED ORDER — GABAPENTIN 100 MG PO CAPS: 200.0000 mg | ORAL_CAPSULE | Freq: Three times a day (TID) | ORAL | 0 refills | Status: DC
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
## 2019-03-28 NOTE — BHH Suicide Risk Assessment (Signed)
[facility] Discharge Suicide Risk Assessment   Principal Problem: <principal problem not specified> Discharge Diagnoses: Active Problems:   MDD (major depressive disorder), recurrent episode, severe ([place])   Total Time spent with patient: 20 minutes  Musculoskeletal: Strength & Muscle Tone: within normal limits Gait & Station: normal Patient leans: N/A  Psychiatric Specialty Exam: Review of Systems  All other systems reviewed and are negative.   Blood pressure 96/75, pulse (!) 51, temperature 97.7 F (36.5 C), resp. rate 16, height 5\' 4"  (1.626 m), weight 49 kg, SpO2 100 %.Body mass index is 18.54 kg/m.  General Appearance: Casual  Eye Contact::  Fair  Speech:  Normal Rate409  Volume:  Normal  Mood:  Anxious  Affect:  Congruent  Thought Process:  Coherent and Descriptions of Associations: Intact  Orientation:  Full (Time, Place, and Person)  Thought Content:  Logical  Suicidal Thoughts:  No  Homicidal Thoughts:  No  Memory:  Immediate;   Fair Recent;   Fair Remote;   Fair  Judgement:  Intact  Insight:  Fair  Psychomotor Activity:  Increased  Concentration:  Fair  Recall:  [employer] of Knowledge:Fair  Language: Good  Akathisia:  Negative  Handed:  Right  AIMS (if indicated):     Assets:  Desire for Improvement Resilience  Sleep:  Number of Hours: 5.5  Cognition: WNL  ADL's:  Intact   Mental Status Per Nursing Assessment::   On Admission:     Demographic Factors:  Male, Low socioeconomic status, Living alone and Unemployed  Loss Factors: Financial problems/change in socioeconomic status  Historical Factors: Impulsivity  Risk Reduction Factors:   NA  Continued Clinical Symptoms:  Depression:   Comorbid alcohol abuse/dependence Impulsivity Alcohol/Substance Abuse/Dependencies  Cognitive Features That Contribute To Risk:  None    Suicide Risk:  Minimal: No identifiable suicidal ideation.  Patients presenting with no risk factors but with morbid  ruminations; may be classified as minimal risk based on the severity of the depressive symptoms  Follow-up Information    [facility] Follow up on 03/29/2019.   Why: Please attend your hospital follow up appointment on Thursday, 10/22 at 3:30p.  Be sure to bring your photo ID, insurance card, and current medications.  Contact information: [place] [address] ph: [phone] fx: [phone]       [facility] Follow up.   Why: You have been accepted for treatment, at this time there are no beds available. June in Admissions will contact you when a bed becomes available. If you do not hear from her within 3 days please contact her.  Contact information: [address] [place] [address] [phone]           Plan Of Care/Follow-up recommendations:  Activity:  ad lib  Sharma Covert, MD 03/28/2019, 11:26 AM

## 2019-03-28 NOTE — Care Management (Signed)
Patient has been accepted for treatment at [facility]. Per June, in Admissions there are currently no beds available at this time. June will contact patient when a bed becomes available. CMA provided contact information for patient to follow up after discharge, as well.   CMA has notified CSW, Stephanie Acre.     Lillia Lengel Care Management Assistant  Email:[email or web address] Office: [phone]

## 2019-03-28 NOTE — Progress Notes (Signed)
Patient ID: [name] [name], male   DOB: 1965/10/24, 53 y.o.   MRN: [id number]   [place] NOVEL CORONAVIRUS (COVID-19) DAILY CHECK-OFF SYMPTOMS - answer yes or no to each - every day NO YES  Have you had a fever in the past 24 hours?  . Fever (Temp > 37.80C / 100F) X   Have you had any of these symptoms in the past 24 hours? . New Cough .  Sore Throat  .  Shortness of Breath .  Difficulty Breathing .  Unexplained Body Aches   X   Have you had any one of these symptoms in the past 24 hours not related to allergies?   . Runny Nose .  Nasal Congestion .  Sneezing   X   If you have had runny nose, nasal congestion, sneezing in the past 24 hours, has it worsened?  X   EXPOSURES - check yes or no X   Have you traveled outside the state in the past 14 days?  X   Have you been in contact with someone with a confirmed diagnosis of COVID-19 or PUI in the past 14 days without wearing appropriate PPE?  X   Have you been living in the same home as a person with confirmed diagnosis of COVID-19 or a PUI (household contact)?    X   Have you been diagnosed with COVID-19?    X              What to do next: Answered NO to all: Answered YES to anything:   Proceed with unit schedule Follow the [facility] Inpatient Flowsheet.

## 2019-03-28 NOTE — Plan of Care (Signed)
D: Pt denies SI. Patient wrote on group paper that he is HI when he get out of here.Patient told this writer he was upset with his family. Patient did not go into details.  A: Pt was offered support and encouragement. Pt was given scheduled medications. Pt was encourage to attend groups. Q 15 minute checks were done for safety.  R:Pt attends groups and interacts well with peers and staff. Pt is taking medication. Pt has no complaints.Pt receptive to treatment and safety maintained on unit.    Problem: Health Behavior/Discharge Planning: Goal: Compliance with treatment plan for underlying cause of condition will improve Outcome: Progressing Note: Patient is compliant with treatment   Problem: Safety: Goal: Periods of time without injury will increase Outcome: Progressing Note: Patient denies SI

## 2019-03-28 NOTE — BHH Group Notes (Signed)
Occupational Therapy Group Note  Date:  03/28/2019 Time:  12:32 PM  Group Topic/Focus:  Self Esteem Action Plan:   The focus of this group is to help patients create a plan to continue to build self-esteem after discharge.  Participation Level:  Active  Participation Quality:  Appropriate  Affect:  Blunted  Cognitive:  Appropriate  Insight: Improving  Engagement in Group:  Engaged  Modes of Intervention:  Activity, Discussion, Education and Socialization  Additional Comments:    S: I have had a lot of loss  O: Pt provided with reflective questions in regard to mental health healing and coping skills. Pt to answer question and facilitate discussion with group members.  A: Pt sharing and relating well with group members. He shares about the loss he has had with losing family members and a child at six months. He shares his grief process and how he deals with guilt.  P: OT group will be x1 per week while pt inpatient.  Zenovia Jarred, MSOT, OTR/L Behavioral Health OT/ Acute Relief OT PHP Office: [phone] [facility] Office: [phone]  Zenovia Jarred 03/28/2019, 12:32 PM

## 2019-03-28 NOTE — Progress Notes (Signed)
Patient has a bed at [facility] in [place]. Patient and psychiatry made aware.  Patient may arrive after 1:00pm today.  CSW scheduled Lyft transportation for 1:30pm. A printed copy of negative COVID screening is on patient's chart.  Stephanie Acre, LCSW-A Clinical Social Worker

## 2019-03-28 NOTE — Progress Notes (Signed)
CSW called [facility] [phone] ext. [phone]) and spoke with "Debbie." Jackelyn Poling reports she is currently doing [employer] and is unsure of bed availability at this time. She will follow up with CSW later regarding shelter availability.  CSW following for disposition and discharge planning.   Stephanie Acre, LCSW-A Clinical Social Worker

## 2019-03-28 NOTE — Progress Notes (Signed)
  [facility] Adult Case Management Discharge Plan :  Will you be returning to the same living situation after discharge:  No. Going to a shelter. At discharge, do you have transportation home?: Yes,  Lyft at 1:30pm. Do you have the ability to pay for your medications: Yes,  Medicaid.  Release of information consent forms completed and in the chart.  Patient to Follow up at: Follow-up Information    [facility] Follow up on 03/29/2019.   Why: Please attend your hospital follow up appointment on Thursday, 10/22 at 3:30p.  Be sure to bring your photo ID, insurance card, and current medications.  Contact information: [place] [address] ph: [phone] fx: [phone]       [facility] Follow up.   Why: You have been accepted for treatment, at this time there are no beds available. June in Admissions will contact you when a bed becomes available. If you do not hear from her within 3 days please contact her.  Contact information: [address] [place] [address] [phone]           Next level of care provider has access to [place] and Suicide Prevention discussed: Yes,  with patient. Two attempts to reach daughter.   Has patient been referred to the Quitline?: Patient refused referral  Patient has been referred for addiction treatment: Yes  Joellen [place], [place] 03/28/2019, 11:33 AM

## 2019-03-28 NOTE — Progress Notes (Signed)
Patient ID: [name] [name], male   DOB: Feb 11, 1966, 53 y.o.   MRN: [id number]  D: Pt alert and oriented on the unit.   A: Education, support, and encouragement provided. Discharge summary, medications and follow up appointments reviewed with pt. Suicide prevention resources provided, including "My 3 App." Pt's belongings in locker # 49 returned and belongings sheet signed.  R: Pt denies SI/HI, A/VH, pain, or any concerns at this time. Pt ambulatory on and off unit. Pt discharged to lobby.

## 2019-07-18 ENCOUNTER — Ambulatory Visit (HOSPITAL_COMMUNITY)
Admission: RE | Admit: 2019-07-18 | Discharge: 2019-07-18 | Disposition: A | Discharge status: Home / Self Care | Payer: Medicaid Other | Attending: Psychiatry | Admitting: Psychiatry

## 2019-07-18 DIAGNOSIS — Z20822 Contact with and (suspected) exposure to covid-19: Secondary | ICD-10-CM | POA: Insufficient documentation

## 2019-07-18 DIAGNOSIS — F1020 Alcohol dependence, uncomplicated: Secondary | ICD-10-CM | POA: Diagnosis present
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
## 2019-07-19 ENCOUNTER — Encounter (HOSPITAL_COMMUNITY): Payer: Self-pay | Admitting: Psychiatry

## 2019-07-19 ENCOUNTER — Other Ambulatory Visit: Payer: Self-pay

## 2019-07-19 ENCOUNTER — Inpatient Hospital Stay (HOSPITAL_COMMUNITY)
Admission: AD | Admit: 2019-07-19 | Discharge: 2019-07-23 | DRG: 885 | Disposition: A | Discharge status: Home / Self Care | Payer: Medicaid Other | Attending: Psychiatry | Admitting: Psychiatry

## 2019-07-19 DIAGNOSIS — R45851 Suicidal ideations: Secondary | ICD-10-CM | POA: Diagnosis present

## 2019-07-19 DIAGNOSIS — F17210 Nicotine dependence, cigarettes, uncomplicated: Secondary | ICD-10-CM | POA: Diagnosis present

## 2019-07-19 DIAGNOSIS — F1020 Alcohol dependence, uncomplicated: Secondary | ICD-10-CM | POA: Diagnosis not present

## 2019-07-19 DIAGNOSIS — F329 Major depressive disorder, single episode, unspecified: Secondary | ICD-10-CM | POA: Diagnosis not present

## 2019-07-19 DIAGNOSIS — I2510 Atherosclerotic heart disease of native coronary artery without angina pectoris: Secondary | ICD-10-CM | POA: Diagnosis not present

## 2019-07-19 DIAGNOSIS — Z8711 Personal history of peptic ulcer disease: Secondary | ICD-10-CM

## 2019-07-19 DIAGNOSIS — F332 Major depressive disorder, recurrent severe without psychotic features: Secondary | ICD-10-CM | POA: Diagnosis not present

## 2019-07-19 LAB — URINALYSIS, ROUTINE W REFLEX MICROSCOPIC
Bilirubin Urine: NEGATIVE
Glucose, UA: NEGATIVE mg/dL
Hgb urine dipstick: NEGATIVE
Ketones, ur: NEGATIVE mg/dL
Leukocytes,Ua: NEGATIVE
Nitrite: NEGATIVE
Protein, ur: NEGATIVE mg/dL
Specific Gravity, Urine: 1.015 (ref 1.005–1.030)
pH: 5 (ref 5.0–8.0)

## 2019-07-19 LAB — COMPREHENSIVE METABOLIC PANEL
ALT: 21 U/L (ref 0–44)
AST: 28 U/L (ref 15–41)
Albumin: 4.2 g/dL (ref 3.5–5.0)
Alkaline Phosphatase: 75 U/L (ref 38–126)
Anion gap: 9 (ref 5–15)
BUN: 12 mg/dL (ref 6–20)
CO2: 22 mmol/L (ref 22–32)
Calcium: 9.2 mg/dL (ref 8.9–10.3)
Chloride: 106 mmol/L (ref 98–111)
Creatinine, Ser: 1.04 mg/dL (ref 0.61–1.24)
GFR calc Af Amer: >60 mL/min (ref >60)
GFR calc non Af Amer: >60 mL/min (ref >60)
Glucose, Bld: 119 mg/dL — ABNORMAL HIGH (ref 70–99)
Potassium: 4 mmol/L (ref 3.5–5.1)
Sodium: 137 mmol/L (ref 135–145)
Total Bilirubin: 1 mg/dL (ref 0.3–1.2)
Total Protein: 7.6 g/dL (ref 6.5–8.1)

## 2019-07-19 LAB — CBC
HCT: 44 % (ref 39.0–52.0)
Hemoglobin: 15.2 g/dL (ref 13.0–17.0)
MCH: 31.7 pg (ref 26.0–34.0)
MCHC: 34.5 g/dL (ref 30.0–36.0)
MCV: 91.9 fL (ref 80.0–100.0)
Platelets: 262 10*3/uL (ref 150–400)
RBC: 4.79 MIL/uL (ref 4.22–5.81)
RDW: 13.2 % (ref 11.5–15.5)
WBC: 4.5 10*3/uL (ref 4.0–10.5)
nRBC: 0 % (ref 0.0–0.2)

## 2019-07-19 LAB — HEMOGLOBIN A1C
Hgb A1c MFr Bld: 4.8 % (ref 4.8–5.6)
Mean Plasma Glucose: 91.06 mg/dL

## 2019-07-19 LAB — RESPIRATORY PANEL BY RT PCR (FLU A&B, COVID)
Influenza A by PCR: NEGATIVE
Influenza B by PCR: NEGATIVE
SARS Coronavirus 2 by RT PCR: NEGATIVE

## 2019-07-19 LAB — TSH: TSH: 1.707 u[IU]/mL (ref 0.350–4.500)

## 2019-07-19 MED ORDER — PANTOPRAZOLE SODIUM 40 MG PO TBEC
40.0000 mg | DELAYED_RELEASE_TABLET | Freq: Every day | ORAL | Status: DC
  Administered 2019-07-19 – 2019-07-23 (×5): 40 mg via ORAL
  Filled 2019-07-19 (×8): qty 1

## 2019-07-19 MED ORDER — MAGNESIUM HYDROXIDE 400 MG/5ML PO SUSP: 30.0000 mL | Freq: Every day | ORAL | Status: DC | PRN

## 2019-07-19 MED ORDER — ESCITALOPRAM OXALATE 10 MG PO TABS
10.0000 mg | ORAL_TABLET | Freq: Every day | ORAL | Status: DC
  Administered 2019-07-19 – 2019-07-23 (×5): 10 mg via ORAL
  Filled 2019-07-19 (×8): qty 1

## 2019-07-19 MED ORDER — ALUM & MAG HYDROXIDE-SIMETH 200-200-20 MG/5ML PO SUSP: 30.0000 mL | ORAL | Status: DC | PRN

## 2019-07-19 MED ORDER — THIAMINE HCL 100 MG PO TABS
100.0000 mg | ORAL_TABLET | Freq: Every day | ORAL | Status: DC
  Administered 2019-07-19 – 2019-07-23 (×4): 100 mg via ORAL
  Filled 2019-07-19 (×8): qty 1

## 2019-07-19 MED ORDER — GABAPENTIN 100 MG PO CAPS
200.0000 mg | ORAL_CAPSULE | Freq: Three times a day (TID) | ORAL | Status: DC
  Administered 2019-07-19 – 2019-07-23 (×13): 200 mg via ORAL
  Filled 2019-07-19 (×20): qty 2

## 2019-07-19 MED ORDER — LORAZEPAM 1 MG PO TABS: 1.0000 mg | ORAL_TABLET | Freq: Four times a day (QID) | ORAL | Status: DC | PRN

## 2019-07-19 MED ORDER — ACETAMINOPHEN 325 MG PO TABS
650.0000 mg | ORAL_TABLET | Freq: Four times a day (QID) | ORAL | Status: DC | PRN
  Administered 2019-07-22: 650 mg via ORAL
  Filled 2019-07-19: qty 2

## 2019-07-19 MED ORDER — SUCRALFATE 1 G PO TABS
1.0000 g | ORAL_TABLET | Freq: Three times a day (TID) | ORAL | Status: DC
  Administered 2019-07-19 – 2019-07-23 (×13): 1 g via ORAL
  Filled 2019-07-19 (×25): qty 1

## 2019-07-19 MED ORDER — FOLIC ACID 1 MG PO TABS
1.0000 mg | ORAL_TABLET | Freq: Every day | ORAL | Status: DC
  Administered 2019-07-19 – 2019-07-23 (×4): 1 mg via ORAL
  Filled 2019-07-19 (×8): qty 1

## 2019-07-19 MED ORDER — TRAZODONE HCL 50 MG PO TABS
50.0000 mg | ORAL_TABLET | Freq: Every evening | ORAL | Status: DC | PRN
  Administered 2019-07-21 – 2019-07-22 (×2): 50 mg via ORAL
  Filled 2019-07-19 (×3): qty 1

## 2019-07-19 MED ORDER — HYDROXYZINE HCL 25 MG PO TABS
25.0000 mg | ORAL_TABLET | Freq: Four times a day (QID) | ORAL | Status: DC | PRN
  Administered 2019-07-21 – 2019-07-22 (×2): 25 mg via ORAL
  Filled 2019-07-19 (×3): qty 1

## 2019-07-19 MED ORDER — THIAMINE HCL 100 MG/ML IJ SOLN: 100.0000 mg | Freq: Every day | INTRAMUSCULAR | Status: DC
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
## 2019-07-19 NOTE — Progress Notes (Signed)
EKG completed this morning per MD order.

## 2019-07-19 NOTE — Progress Notes (Signed)
Patient has been sleeping in his bed most of the day.  Sometimes he hears voices to overdose and not wake up.  Sometimes has visual hallucinations.  Suicidal at times, contracts for safety.   Cooperative and pleasant.  Emotional support and encouragement given patient. Medications administered per MD orders.  Safety maintained with 15 minute checks.

## 2019-07-19 NOTE — Progress Notes (Signed)
   07/19/19 2100  Psych Admission Type (Psych Patients Only)  Admission Status Voluntary  Psychosocial Assessment  Patient Complaints Anxiety  Eye Contact Brief  Facial Expression Pensive  Affect Depressed;Sad  Speech Logical/coherent  Interaction Assertive  Motor Activity Other (Comment) (WDL)  Appearance/Hygiene In hospital gown  Behavior Characteristics Cooperative  Mood Anxious  Thought Process  Coherency WDL  Content WDL  Delusions WDL  Perception WDL  Judgment Impaired  Confusion None  Danger to Self  Current suicidal ideation? Denies  Self-Injurious Behavior No self-injurious ideation or behavior indicators observed or expressed   Agreement Not to Harm Self Yes  Description of Agreement Verbal  Danger to Others  Danger to Others Reported or observed  Danger to Others Abnormal  Harmful Behavior to others No threats or harm toward other people  Destructive Behavior No threats or harm toward property

## 2019-07-19 NOTE — BHH Suicide Risk Assessment (Signed)
[facility] Admission Suicide Risk Assessment   Nursing information obtained from:  Patient, Review of record Demographic factors:  Male Current Mental Status:  Suicidal ideation indicated by patient, Self-harm thoughts Loss Factors:  NA Historical Factors:  Impulsivity Risk Reduction Factors:  Positive social support, Positive therapeutic relationship, Sense of responsibility to family, Living with another person, especially a relative, Positive coping skills or problem solving skills  Total Time spent with patient: 30 minutes Principal Problem: MDD (major depressive disorder), recurrent episode, severe ([place]) Diagnosis:  Principal Problem:   MDD (major depressive disorder), recurrent episode, severe ([place]) Active Problems:   Alcohol use disorder, severe, dependence ([place])  Subjective Data: Patient is seen and examined.  Patient is a 54 year old male with a past psychiatric history significant for alcohol dependence who was brought directly to the behavioral health hospital on 07/18/2019 by [facility] police secondary to suicidal ideation.  The patient stated that he had recently been having suicidal thoughts.  He stated "a little voice was telling me to take the pills and go to sleep".  He stated that his last psychiatric hospitalization was here at the behavioral health hospital on 03/24/2019.  His last psychiatric hospitalization and I can find in the electronic medical record and was at [facility] on 05/06/2019.  His diagnosis at that time was alcohol dependence with alcohol-induced mood disorder.  His discharge medications at that time included fluoxetine and trazodone as well as gabapentin.  He has a history of significant GI problems including gastric ulcers and has previously been treated with Protonix as well as Carafate.  His discharge medications from our facility in October 2020 included Lexapro, gabapentin, Protonix and trazodone.  He admitted to suicidal ideation on admission and his desire to go  to a substance rehabilitation facility.  It does appear that his last admission here in October of last year he followed up at [facility] residential, but it is unclear whether or not he ever attended there or was sober for any period of time while not being there.  He has a history of being admitted to our facility at least 6 or 8 times in the last several years.  Continued Clinical Symptoms:  Alcohol Use Disorder Identification Test Final Score (AUDIT): 24 The "Alcohol Use Disorders Identification Test", Guidelines for Use in Primary Care, Second Edition.  World Pharmacologist [facility]). Score between 0-7:  no or low risk or alcohol related problems. Score between 8-15:  moderate risk of alcohol related problems. Score between 16-19:  high risk of alcohol related problems. Score 20 or above:  warrants further diagnostic evaluation for alcohol dependence and treatment.   CLINICAL FACTORS:   Depression:   Anhedonia Comorbid alcohol abuse/dependence Hopelessness Insomnia Alcohol/Substance Abuse/Dependencies   Musculoskeletal: Strength & Muscle Tone: within normal limits Gait & Station: normal Patient leans: N/A  Psychiatric Specialty Exam: Physical Exam  Nursing note and vitals reviewed. Constitutional: He is oriented to person, place, and time. He appears well-developed and well-nourished.  HENT:  Head: Normocephalic and atraumatic.  Respiratory: Effort normal.  Neurological: He is alert and oriented to person, place, and time.    Review of Systems  Blood pressure 128/86, pulse 65, temperature 98.1 F (36.7 C), temperature source Oral, resp. rate 20, SpO2 98 %.There is no height or weight on file to calculate BMI.  General Appearance: Disheveled  Eye Contact:  Fair  Speech:  Normal Rate  Volume:  Normal  Mood:  Euthymic  Affect:  Congruent  Thought Process:  Coherent and Descriptions  of Associations: Circumstantial  Orientation:  Full (Time, Place, and Person)  Thought  Content:  Logical  Suicidal Thoughts:  No  Homicidal Thoughts:  No  Memory:  Immediate;   Fair Recent;   Fair Remote;   Fair  Judgement:  Impaired  Insight:  Lacking  Psychomotor Activity:  Normal  Concentration:  Concentration: Fair and Attention Span: Fair  Recall:  [employer] of Knowledge:  Fair  Language:  Good  Akathisia:  Negative  Handed:  Right  AIMS (if indicated):     Assets:  Desire for Improvement Resilience  ADL's:  Intact  Cognition:  WNL  Sleep:         COGNITIVE FEATURES THAT CONTRIBUTE TO RISK:  None    SUICIDE RISK:   Minimal: No identifiable suicidal ideation.  Patients presenting with no risk factors but with morbid ruminations; may be classified as minimal risk based on the severity of the depressive symptoms  PLAN OF CARE: Patient is seen and examined.  Patient is a 54 year old male with the above-stated past psychiatric history who was admitted secondary to suicidal ideation.  He will be admitted to the hospital.  He will be integrated into the milieu.  He will be encouraged to attend groups.  I will restart his Lexapro, gabapentin and trazodone.  I will restart his Protonix as well as Carafate.  I will give him folic acid as well as thiamine for nutritional supplementation.  I will also write for lorazepam 1 mg p.o. every 6 hours a CIWA greater than 10.  The only laboratories available right now are his negative coronavirus.  We will attempt to get laboratories this afternoon.  He is stating at this point that he would like residential substance abuse treatment, and I have redirected him to social work to discuss his options.  His vital signs are stable, he is afebrile.  He does not appear to be any significant withdrawal at this point.  I certify that inpatient services furnished can reasonably be expected to improve the patient's condition.   Sharma Covert, MD 07/19/2019, 9:35 AM

## 2019-07-19 NOTE — H&P (Signed)
Psychiatric Admission Assessment Adult  Patient Identification: [name] [name] MRN:  [id number] Date of Evaluation:  07/19/2019 Chief Complaint:  Alcohol use disorder, severe, dependence ([place]) [F10.20] Principal Diagnosis: MDD (major depressive disorder), recurrent episode, severe ([place]) Diagnosis:  Principal Problem:   MDD (major depressive disorder), recurrent episode, severe ([place]) Active Problems:   Alcohol use disorder, severe, dependence ([place])  History of Present Illness: Patient is seen and examined.  Patient is a 54 year old male with a past psychiatric history significant for alcohol dependence who was brought directly to the behavioral health hospital on 07/18/2019 by [facility] police secondary to suicidal ideation.  The patient stated that he had recently been having suicidal thoughts.  He stated "a little voice was telling me to take the pills and go to sleep".  He stated that his last psychiatric hospitalization was here at the behavioral health hospital on 03/24/2019.  His last psychiatric hospitalization and I can find in the electronic medical record and was at [facility] on 05/06/2019.  His diagnosis at that time was alcohol dependence with alcohol-induced mood disorder.  His discharge medications at that time included fluoxetine and trazodone as well as gabapentin.  He has a history of significant GI problems including gastric ulcers and has previously been treated with Protonix as well as Carafate.  His discharge medications from our facility in October 2020 included Lexapro, gabapentin, Protonix and trazodone.  He admitted to suicidal ideation on admission and his desire to go to a substance rehabilitation facility.  It does appear that his last admission here in October of last year he followed up at [facility] residential, but it is unclear whether or not he ever attended there or was sober for any period of time while not being there.  He has a history of being admitted to our facility  at least 6 or 8 times in the last several years.  Associated Signs/Symptoms: Depression Symptoms:  depressed mood, anhedonia, insomnia, psychomotor agitation, fatigue, feelings of worthlessness/guilt, difficulty concentrating, hopelessness, suicidal thoughts without plan, anxiety, loss of energy/fatigue, disturbed sleep, (Hypo) Manic Symptoms:  Impulsivity, Irritable Mood, Labiality of Mood, Anxiety Symptoms:  Excessive Worry, Psychotic Symptoms:  Hallucinations: Auditory PTSD Symptoms: Negative Total Time spent with patient: 30 minutes  Past Psychiatric History: Patient has a longstanding history with multiple admissions for depression, suicidal ideation, alcohol.  He was previously hospitalized on 03/24/2019.  He has been diagnosed with bipolar disorder in the past, but I suspect that diagnosis is not correct.  He does admit to auditory hallucinations, and he has had those in the past.  Is the patient at risk to self? Yes.    Has the patient been a risk to self in the past 6 months? Yes.    Has the patient been a risk to self within the distant past? Yes.    Is the patient a risk to others? No.  Has the patient been a risk to others in the past 6 months? No.  Has the patient been a risk to others within the distant past? No.   Prior Inpatient Therapy:   Prior Outpatient Therapy:    Alcohol Screening: 1. How often do you have a drink containing alcohol?: 4 or more times a week 2. How many drinks containing alcohol do you have on a typical day when you are drinking?: 7, 8, or 9 3. How often do you have six or more drinks on one occasion?: Daily or almost daily AUDIT-C Score: 11 4. How often during the last  year have you found that you were not able to stop drinking once you had started?: Weekly 5. How often during the last year have you failed to do what was normally expected from you becasue of drinking?: Weekly 6. How often during the last year have you needed a first  drink in the morning to get yourself going after a heavy drinking session?: Weekly 7. How often during the last year have you had a feeling of guilt of remorse after drinking?: Less than monthly 8. How often during the last year have you been unable to remember what happened the night before because you had been drinking?: Less than monthly 9. Have you or someone else been injured as a result of your drinking?: No 10. Has a relative or friend or a doctor or another health worker been concerned about your drinking or suggested you cut down?: Yes, but not in the last year Alcohol Use Disorder Identification Test Final Score (AUDIT): 24 Alcohol Brief Interventions/Follow-up: Brief Advice, Continued Monitoring, AUDIT Score <7 follow-up not indicated Substance Abuse History in the last 12 months:  Yes.   Consequences of Substance Abuse: Medical Consequences:  Multiple psychiatric hospitalizations Previous Psychotropic Medications: Yes  Psychological Evaluations: Yes  Past Medical History:  Past Medical History:  Diagnosis Date  . Abnormal ECG    a. early repolarization  . Arthritis    "my whole left side" (05/03/2014)  . Bipolar disorder ([place])   . Bradycardia    a. asymptomatic  . CAD in native artery    a. Nonobstructive cath 11/2007;  b. Presented with ST elevation - Nonobstructive cath 08/2011  . Chest pain, mid sternal   . Coronary artery disease   . GERD (gastroesophageal reflux disease)   . History of cocaine abuse ([place])    a. quit ? 2009  . History of ETOH abuse    a. drinks 2 "40's" / wk  . Marijuana abuse    a. uses ~ 1x /wk or less  . Pneumonia   . Stomach ulcer   . Syncope    a. 12/2010 - presumed to be vasovagal  . Tobacco abuse     Past Surgical History:  Procedure Laterality Date  . CARDIAC CATHETERIZATION  2009; 08/2011   [name] 08/06/2011  . LEFT HEART CATHETERIZATION WITH CORONARY ANGIOGRAM N/A 08/16/2011   Procedure: LEFT HEART CATHETERIZATION WITH CORONARY  ANGIOGRAM;  Surgeon: [name], MD;  Location: [facility] CATH LAB;  Service: Cardiovascular;  Laterality: N/A;   Family History: History reviewed. No pertinent family history. Family Psychiatric  History: He stated his sister has a history depression.  Father had a history of alcohol dependence.  He currently lives with his mother. Tobacco Screening:   Social History:  Social History   Substance and Sexual Activity  Alcohol Use Yes  . Alcohol/week: 48.0 standard drinks  . Types: 48 Cans of beer per week   Comment: 2 40 oz beers per day     Social History   Substance and Sexual Activity  Drug Use Yes  . Types: Marijuana, Cocaine    Additional Social History:                           Allergies:   Allergies  Allergen Reactions  . Aspirin Other (See Comments)    Aggravates ulcer. "Causes chest pain."  . Pepperoni [Pickled Meat] Other (See Comments)    aggrevates ulcer  . Tomato Other (See Comments)  Foods with tomato sauce aggrevate ulcers   Lab Results:  Results for orders placed or performed during the hospital encounter of 07/18/19 (from the past 48 hour(s))  Respiratory Panel by RT PCR (Flu A&B, Covid) - Nasopharyngeal Swab     Status: None   Collection Time: 07/19/19 12:10 AM   Specimen: Nasopharyngeal Swab  Result Value Ref Range   SARS Coronavirus 2 by RT PCR NEGATIVE NEGATIVE    Comment: (NOTE) SARS-CoV-2 target nucleic acids are NOT DETECTED. The SARS-CoV-2 RNA is generally detectable in upper respiratoy specimens during the acute phase of infection. The lowest concentration of SARS-CoV-2 viral copies this assay can detect is 131 copies/mL. A negative result does not preclude SARS-Cov-2 infection and should not be used as the sole basis for treatment or other patient management decisions. A negative result may occur with  improper specimen collection/handling, submission of specimen other than nasopharyngeal swab, presence of viral mutation(s)  within the areas targeted by this assay, and inadequate number of viral copies (<131 copies/mL). A negative result must be combined with clinical observations, patient history, and epidemiological information. The expected result is Negative. Fact Sheet for Patients:  PinkCheek.be Fact Sheet for Healthcare Providers:  GravelBags.it This test is not yet ap proved or cleared by the [place] FDA and  has been authorized for detection and/or diagnosis of SARS-CoV-2 by FDA under an Emergency Use Authorization (EUA). This EUA will remain  in effect (meaning this test can be used) for the duration of the COVID-19 declaration under Section 564(b)(1) of the Act, 21 U.S.C. section 360bbb-3(b)(1), unless the authorization is terminated or revoked sooner.    Influenza A by PCR NEGATIVE NEGATIVE   Influenza B by PCR NEGATIVE NEGATIVE    Comment: (NOTE) The Xpert Xpress SARS-CoV-2/FLU/RSV assay is intended as an aid in  the diagnosis of influenza from Nasopharyngeal swab specimens and  should not be used as a sole basis for treatment. Nasal washings and  aspirates are unacceptable for Xpert Xpress SARS-CoV-2/FLU/RSV  testing. Fact Sheet for Patients: PinkCheek.be Fact Sheet for Healthcare Providers: GravelBags.it This test is not yet approved or cleared by the [place] FDA and  has been authorized for detection and/or diagnosis of SARS-CoV-2 by  FDA under an Emergency Use Authorization (EUA). This EUA will remain  in effect (meaning this test can be used) for the duration of the  Covid-19 declaration under Section 564(b)(1) of the Act, 21  U.S.C. section 360bbb-3(b)(1), unless the authorization is  terminated or revoked. Performed at [facility], [place] [address], [place], [place] [address]     Blood Alcohol level:  Lab Results  Component Value  Date   ETH 132 (H) 03/23/2019   ETH 198 (H) [id number]    Metabolic Disorder Labs:  Lab Results  Component Value Date   HGBA1C 5.0 03/25/2019   MPG 97 03/25/2019   MPG 94 05/04/2016   No results found for: PROLACTIN Lab Results  Component Value Date   CHOL 208 (H) 03/25/2019   TRIG 97 03/25/2019   HDL 91 03/25/2019   CHOLHDL 2.3 03/25/2019   VLDL 19 03/25/2019   LDLCALC 98 03/25/2019   LDLCALC 74 05/04/2016    Current Medications: Current Facility-Administered Medications  Medication Dose Route Frequency Provider Last Rate Last Admin  . acetaminophen (TYLENOL) tablet 650 mg  650 mg Oral Q6H PRN Sharma Covert, MD      . alum & mag hydroxide-simeth (MAALOX/MYLANTA) 200-200-20 MG/5ML suspension 30 mL  30  mL Oral Q4H PRN Sharma Covert, MD      . escitalopram [facility]) tablet 10 mg  10 mg Oral Daily Sharma Covert, MD   10 mg at 07/19/19 0819  . folic acid (FOLVITE) tablet 1 mg  1 mg Oral Daily Sharma Covert, MD   1 mg at 07/19/19 0819  . gabapentin (NEURONTIN) capsule 200 mg  200 mg Oral TID Sharma Covert, MD   200 mg at 07/19/19 1121  . hydrOXYzine (ATARAX/VISTARIL) tablet 25 mg  25 mg Oral Q6H PRN Sharma Covert, MD      . LORazepam (ATIVAN) tablet 1 mg  1 mg Oral Q6H PRN Sharma Covert, MD      . magnesium hydroxide (MILK OF MAGNESIA) suspension 30 mL  30 mL Oral Daily PRN Sharma Covert, MD      . pantoprazole (PROTONIX) EC tablet 40 mg  40 mg Oral Daily Sharma Covert, MD   40 mg at 07/19/19 0819  . sucralfate (CARAFATE) tablet 1 g  1 g Oral TID WC & HS Sharma Covert, MD   1 g at 07/19/19 1122  . thiamine (B-1) injection 100 mg  100 mg Intravenous Daily Sharma Covert, MD      . thiamine tablet 100 mg  100 mg Oral Daily Sharma Covert, MD   100 mg at 07/19/19 0820  . traZODone (DESYREL) tablet 50 mg  50 mg Oral QHS PRN Sharma Covert, MD       PTA Medications: Medications Prior to Admission  Medication Sig Dispense  Refill Last Dose  . escitalopram (LEXAPRO) 10 MG tablet Take 1 tablet (10 mg total) by mouth daily. (Patient not taking: Reported on 07/19/2019) 30 tablet 0 Not Taking at Unknown time  . gabapentin (NEURONTIN) 100 MG capsule Take 2 capsules (200 mg total) by mouth 3 (three) times daily. (Patient not taking: Reported on 07/19/2019) 180 capsule 0 Not Taking at Unknown time  . pantoprazole (PROTONIX) 40 MG tablet Take 1 tablet (40 mg total) by mouth daily. (Patient not taking: Reported on 07/19/2019) 30 tablet 0 Not Taking at Unknown time  . traZODone (DESYREL) 50 MG tablet Take 1 tablet (50 mg total) by mouth at bedtime as needed for sleep. (Patient not taking: Reported on 07/19/2019) 30 tablet 0 Not Taking at Unknown time    Musculoskeletal: Strength & Muscle Tone: within normal limits Gait & Station: normal Patient leans: N/A  Psychiatric Specialty Exam: Physical Exam  Nursing note and vitals reviewed. Constitutional: He is oriented to person, place, and time. He appears well-developed and well-nourished.  HENT:  Head: Normocephalic and atraumatic.  Respiratory: Effort normal.  Neurological: He is alert and oriented to person, place, and time.    Review of Systems  Blood pressure 128/86, pulse 65, temperature 98.1 F (36.7 C), temperature source Oral, resp. rate 20, SpO2 98 %.There is no height or weight on file to calculate BMI.  General Appearance: Casual  Eye Contact:  Fair  Speech:  Normal Rate  Volume:  Normal  Mood:  Anxious  Affect:  Congruent  Thought Process:  Coherent and Descriptions of Associations: Circumstantial  Orientation:  Full (Time, Place, and Person)  Thought Content:  Hallucinations: Auditory  Suicidal Thoughts:  No  Homicidal Thoughts:  No  Memory:  Immediate;   Poor Recent;   Poor Remote;   Poor  Judgement:  Impaired  Insight:  Lacking  Psychomotor Activity:  Normal  Concentration:  Concentration:  Fair and Attention Span: Fair  Recall:  [employer] of  Knowledge:  Fair  Language:  Good  Akathisia:  Negative  Handed:  Right  AIMS (if indicated):     Assets:  Desire for Improvement Resilience  ADL's:  Intact  Cognition:  WNL  Sleep:       Treatment Plan Summary: Daily contact with patient to assess and evaluate symptoms and progress in treatment, Medication management and Plan : Patient is seen and examined.  Patient is a 54 year old male with the above-stated past psychiatric history who was admitted secondary to suicidal ideation.  He will be admitted to the hospital.  He will be integrated into the milieu.  He will be encouraged to attend groups.  I will restart his Lexapro, gabapentin and trazodone.  I will restart his Protonix as well as Carafate.  I will give him folic acid as well as thiamine for nutritional supplementation.  I will also write for lorazepam 1 mg p.o. every 6 hours a CIWA greater than 10.  The only laboratories available right now are his negative coronavirus.  We will attempt to get laboratories this afternoon.  He is stating at this point that he would like residential substance abuse treatment, and I have redirected him to social work to discuss his options.  His vital signs are stable, he is afebrile.  He does not appear to be any significant withdrawal at this point.  Observation Level/Precautions:  Detox 15 minute checks  Laboratory:  Chemistry Profile  Psychotherapy:    Medications:    Consultations:    Discharge Concerns:    Estimated LOS:  Other:     Physician Treatment Plan for Primary Diagnosis: MDD (major depressive disorder), recurrent episode, severe ([place]) Long Term Goal(s): Improvement in symptoms so as ready for discharge  Short Term Goals: Ability to identify changes in lifestyle to reduce recurrence of condition will improve, Ability to verbalize feelings will improve, Ability to disclose and discuss suicidal ideas, Ability to demonstrate self-control will improve, Ability to identify and develop  effective coping behaviors will improve, Ability to maintain clinical measurements within normal limits will improve, Compliance with prescribed medications will improve and Ability to identify triggers associated with substance abuse/mental health issues will improve  Physician Treatment Plan for Secondary Diagnosis: Principal Problem:   MDD (major depressive disorder), recurrent episode, severe ([place]) Active Problems:   Alcohol use disorder, severe, dependence ([place])  Long Term Goal(s): Improvement in symptoms so as ready for discharge  Short Term Goals: Ability to identify changes in lifestyle to reduce recurrence of condition will improve, Ability to verbalize feelings will improve, Ability to disclose and discuss suicidal ideas, Ability to demonstrate self-control will improve, Ability to identify and develop effective coping behaviors will improve, Ability to maintain clinical measurements within normal limits will improve, Compliance with prescribed medications will improve and Ability to identify triggers associated with substance abuse/mental health issues will improve  I certify that inpatient services furnished can reasonably be expected to improve the patient's condition.    Sharma Covert, MD 2/11/20212:48 PM

## 2019-07-19 NOTE — Progress Notes (Signed)
RN Vaughan Basta and MHT Mendel Ryder at bedside to transport pt to [employer],  Pt A&O x 3, gait steady, no distress noted, calm & cooperative.

## 2019-07-19 NOTE — BH Assessment (Signed)
[facility] Assessment Progress Note   Pt being reviewed by [facility] Koren Shiver.  Pt to receive COVID test.

## 2019-07-19 NOTE — Plan of Care (Signed)
Nurse discussed anxiety, depression and coping skills with patient.  

## 2019-07-19 NOTE — Progress Notes (Signed)
Patient ID: [name] [name], male   DOB: 02/29/1966, 54 y.o.   MRN: [id number] Pt A&O x 4, presents with SI, plant to overdose on pills and not wake up. HI also, thoughts of harms towards others, pt reports he has access to firearms.  Auditory hallucinations, hears voices but can tune them out.  Pt calm & cooperative, no distress noted.  Skin search completed, monitoring for safety.

## 2019-07-20 NOTE — BHH Counselor (Signed)
CSW spoke with admissions M[facility] of the [facility] at [facility]. Director reports there are available beds today for men.  Stephanie Acre, LCSW-A Clinical Social Worker

## 2019-07-20 NOTE — Progress Notes (Signed)
[facility] MD Progress Note  07/20/2019 10:25 AM [name] [name]  MRN:  [id number] Subjective: Patient is a 54 year old male with a past psychiatric history significant for alcohol dependence and substance-induced mood disorder who was brought directly to the behavioral health hospital on 07/18/2019 by [facility] police secondary to suicidal ideation.  Objective: Patient is seen and examined.  Patient is a 54 year old male with the above-stated past psychiatric history who is seen in follow-up.  He is seen in treatment team today as well.  He denied any withdrawal symptoms today.  He stated that he felt as though he needed a 90-day program.  He stated he has been in multiple 30-day programs, but he continues to go back to drinking.  Social work discussed with him today the possibility of the [facility].  Social work contacted the [facility], and they had beds available today, but the patient felt as though he wanted to wait until Monday given possible alcohol withdrawal symptoms.  He denied any nausea, vomiting, fever, chills, chest pain or shortness of breath.  His vital signs are stable, he is afebrile.  He slept 8.25 hours last night.  His most recent CIWA was 0.  Laboratories from yesterday showed essentially normal electrolytes including his liver function enzymes.  His CBC was essentially normal.  TSH was 1.707.  Drug screen has not yet been obtained.  His EKG showed a sinus bradycardia with early repolarization.  QTc was normal.  He denied any suicidal or homicidal ideation.  Principal Problem: MDD (major depressive disorder), recurrent episode, severe ([employer]) Diagnosis: Principal Problem:   MDD (major depressive disorder), recurrent episode, severe ([place]) Active Problems:   Alcohol use disorder, severe, dependence ([place])  Total Time spent with patient: 15 minutes  Past Psychiatric History: See admission H&P  Past Medical History:  Past Medical History:  Diagnosis Date  . Abnormal  ECG    a. early repolarization  . Arthritis    "my whole left side" (05/03/2014)  . Bipolar disorder ([place])   . Bradycardia    a. asymptomatic  . CAD in native artery    a. Nonobstructive cath 11/2007;  b. Presented with ST elevation - Nonobstructive cath 08/2011  . Chest pain, mid sternal   . Coronary artery disease   . GERD (gastroesophageal reflux disease)   . History of cocaine abuse ([place])    a. quit ? 2009  . History of ETOH abuse    a. drinks 2 "40's" / wk  . Marijuana abuse    a. uses ~ 1x /wk or less  . Pneumonia   . Stomach ulcer   . Syncope    a. 12/2010 - presumed to be vasovagal  . Tobacco abuse     Past Surgical History:  Procedure Laterality Date  . CARDIAC CATHETERIZATION  2009; 08/2011   [name] 08/06/2011  . LEFT HEART CATHETERIZATION WITH CORONARY ANGIOGRAM N/A 08/16/2011   Procedure: LEFT HEART CATHETERIZATION WITH CORONARY ANGIOGRAM;  Surgeon: [name], MD;  Location: [facility] CATH LAB;  Service: Cardiovascular;  Laterality: N/A;   Family History: History reviewed. No pertinent family history. Family Psychiatric  History: See admission H&P Social History:  Social History   Substance and Sexual Activity  Alcohol Use Yes  . Alcohol/week: 48.0 standard drinks  . Types: 48 Cans of beer per week   Comment: 2 40 oz beers per day     Social History   Substance and Sexual Activity  Drug Use Yes  . Types:  Marijuana, Cocaine    Social History   Socioeconomic History  . Marital status: Single    Spouse name: Not on file  . Number of children: Not on file  . Years of education: Not on file  . Highest education level: Not on file  Occupational History  . Not on file  Tobacco Use  . Smoking status: Current Some Day Smoker    Packs/day: 0.50    Years: 20.00    Pack years: 10.00    Types: Cigarettes    Last attempt to quit: 06/24/2015    Years since quitting: 4.0  . Smokeless tobacco: Never Used  Substance and Sexual Activity  . Alcohol use: Yes     Alcohol/week: 48.0 standard drinks    Types: 48 Cans of beer per week    Comment: 2 40 oz beers per day  . Drug use: Yes    Types: Marijuana, Cocaine  . Sexual activity: Yes  Other Topics Concern  . Not on file  Social History Narrative   Lives with his daughter currently and receives SSI.    Social Determinants of Health   Financial Resource Strain:   . Difficulty of Paying Living Expenses: Not on file  Food Insecurity:   . Worried About Charity fundraiser in the Last Year: Not on file  . Ran Out of Food in the Last Year: Not on file  Transportation Needs:   . Lack of Transportation (Medical): Not on file  . Lack of Transportation (Non-Medical): Not on file  Physical Activity:   . Days of Exercise per Week: Not on file  . Minutes of Exercise per Session: Not on file  Stress:   . Feeling of Stress : Not on file  Social Connections:   . Frequency of Communication with Friends and Family: Not on file  . Frequency of Social Gatherings with Friends and Family: Not on file  . Attends Religious Services: Not on file  . Active Member of Clubs or Organizations: Not on file  . Attends Archivist Meetings: Not on file  . Marital Status: Not on file   Additional Social History:                         Sleep: Good  Appetite:  Good  Current Medications: Current Facility-Administered Medications  Medication Dose Route Frequency Provider Last Rate Last Admin  . acetaminophen (TYLENOL) tablet 650 mg  650 mg Oral Q6H PRN Sharma Covert, MD      . alum & mag hydroxide-simeth (MAALOX/MYLANTA) 200-200-20 MG/5ML suspension 30 mL  30 mL Oral Q4H PRN Sharma Covert, MD      . escitalopram (LEXAPRO) tablet 10 mg  10 mg Oral Daily Sharma Covert, MD   10 mg at 07/20/19 0759  . folic acid (FOLVITE) tablet 1 mg  1 mg Oral Daily Sharma Covert, MD   1 mg at 07/20/19 0759  . gabapentin (NEURONTIN) capsule 200 mg  200 mg Oral TID Sharma Covert, MD   200 mg  at 07/20/19 0759  . hydrOXYzine (ATARAX/VISTARIL) tablet 25 mg  25 mg Oral Q6H PRN Sharma Covert, MD      . LORazepam (ATIVAN) tablet 1 mg  1 mg Oral Q6H PRN Sharma Covert, MD      . magnesium hydroxide (MILK OF MAGNESIA) suspension 30 mL  30 mL Oral Daily PRN Sharma Covert, MD      .  pantoprazole (PROTONIX) EC tablet 40 mg  40 mg Oral Daily Sharma Covert, MD   40 mg at 07/20/19 0759  . sucralfate (CARAFATE) tablet 1 g  1 g Oral TID WC & HS Sharma Covert, MD   1 g at 07/20/19 0800  . thiamine (B-1) injection 100 mg  100 mg Intravenous Daily Sharma Covert, MD      . thiamine tablet 100 mg  100 mg Oral Daily Sharma Covert, MD   100 mg at 07/20/19 0759  . traZODone (DESYREL) tablet 50 mg  50 mg Oral QHS PRN Sharma Covert, MD        Lab Results:  Results for orders placed or performed during the hospital encounter of 07/19/19 (from the past 48 hour(s))  Urinalysis, Routine w reflex microscopic     Status: None   Collection Time: 07/19/19  6:03 PM  Result Value Ref Range   Color, Urine YELLOW YELLOW   APPearance CLEAR CLEAR   Specific Gravity, Urine 1.015 1.005 - 1.030   pH 5.0 5.0 - 8.0   Glucose, UA NEGATIVE NEGATIVE mg/dL   Hgb urine dipstick NEGATIVE NEGATIVE   Bilirubin Urine NEGATIVE NEGATIVE   Ketones, ur NEGATIVE NEGATIVE mg/dL   Protein, ur NEGATIVE NEGATIVE mg/dL   Nitrite NEGATIVE NEGATIVE   Leukocytes,Ua NEGATIVE NEGATIVE    Comment: Performed at [facility] [address], [place], [place] [address]  CBC     Status: None   Collection Time: 07/19/19  6:07 PM  Result Value Ref Range   WBC 4.5 4.0 - 10.5 K/uL   RBC 4.79 4.22 - 5.81 MIL/uL   Hemoglobin 15.2 13.0 - 17.0 g/dL   HCT 44.0 39.0 - 52.0 %   MCV 91.9 80.0 - 100.0 fL   MCH 31.7 26.0 - 34.0 pg   MCHC 34.5 30.0 - 36.0 g/dL   RDW 13.2 11.5 - 15.5 %   Platelets 262 150 - 400 K/uL   nRBC 0.0 0.0 - 0.2 %    Comment: Performed at [facility],  [place] [address] [place], [place] [address]  Comprehensive metabolic panel     Status: Abnormal   Collection Time: 07/19/19  6:07 PM  Result Value Ref Range   Sodium 137 135 - 145 mmol/L   Potassium 4.0 3.5 - 5.1 mmol/L   Chloride 106 98 - 111 mmol/L   CO2 22 22 - 32 mmol/L   Glucose, Bld 119 (H) 70 - 99 mg/dL   BUN 12 6 - 20 mg/dL   Creatinine, Ser 1.04 0.61 - 1.24 mg/dL   Calcium 9.2 8.9 - 10.3 mg/dL   Total Protein 7.6 6.5 - 8.1 g/dL   Albumin 4.2 3.5 - 5.0 g/dL   AST 28 15 - 41 U/L   ALT 21 0 - 44 U/L   Alkaline Phosphatase 75 38 - 126 U/L   Total Bilirubin 1.0 0.3 - 1.2 mg/dL   GFR calc non Af Amer >60 >60 mL/min   GFR calc Af Amer >60 >60 mL/min   Anion gap 9 5 - 15    Comment: Performed at [facility], [place] [address], [place], [place] [address]  Hemoglobin A1c     Status: None   Collection Time: 07/19/19  6:07 PM  Result Value Ref Range   Hgb A1c MFr Bld 4.8 4.8 - 5.6 %    Comment: (NOTE) Pre diabetes:  5.7%-6.4% Diabetes:              >6.4% Glycemic control for   <7.0% adults with diabetes    Mean Plasma Glucose 91.06 mg/dL    Comment: Performed at [facility] [address] [place], [place] [address]  TSH     Status: None   Collection Time: 07/19/19  6:07 PM  Result Value Ref Range   TSH 1.707 0.350 - 4.500 uIU/mL    Comment: Performed by a 3rd Generation assay with a functional sensitivity of <=0.01 uIU/mL. Performed at [facility], [place] [address] [place], [place] [address]     Blood Alcohol level:  Lab Results  Component Value Date   ETH 132 (H) 03/23/2019   ETH 198 (H) [id number]    Metabolic Disorder Labs: Lab Results  Component Value Date   HGBA1C 4.8 07/19/2019   MPG 91.06 07/19/2019   MPG 97 03/25/2019   No results found for: PROLACTIN Lab Results  Component Value Date   CHOL 208 (H) 03/25/2019   TRIG 97 03/25/2019   HDL 91 03/25/2019   CHOLHDL 2.3 03/25/2019   VLDL 19 03/25/2019    LDLCALC 98 03/25/2019   LDLCALC 74 05/04/2016    Physical Findings: AIMS: Facial and Oral Movements Muscles of Facial Expression: None, normal Lips and Perioral Area: None, normal Jaw: None, normal Tongue: None, normal,Extremity Movements Upper (arms, wrists, hands, fingers): None, normal Lower (legs, knees, ankles, toes): None, normal, Trunk Movements Neck, shoulders, hips: None, normal, Overall Severity Severity of abnormal movements (highest score from questions above): None, normal Incapacitation due to abnormal movements: None, normal Patient's awareness of abnormal movements (rate only patient's report): No Awareness, Dental Status Current problems with teeth and/or dentures?: No Does patient usually wear dentures?: No  CIWA:  CIWA-Ar Total: 0 COWS:  COWS Total Score: 0  Musculoskeletal: Strength & Muscle Tone: within normal limits Gait & Station: normal Patient leans: N/A  Psychiatric Specialty Exam: Physical Exam  Nursing note and vitals reviewed. Constitutional: He is oriented to person, place, and time. He appears well-developed and well-nourished.  HENT:  Head: Normocephalic and atraumatic.  Respiratory: Effort normal.  Neurological: He is alert and oriented to person, place, and time.    Review of Systems  Blood pressure 119/90, pulse 60, temperature 98 F (36.7 C), temperature source Oral, resp. rate 20, SpO2 98 %.There is no height or weight on file to calculate BMI.  General Appearance: Casual  Eye Contact:  Fair  Speech:  Normal Rate  Volume:  Normal  Mood:  Euthymic  Affect:  Congruent  Thought Process:  Coherent and Descriptions of Associations: Intact  Orientation:  Full (Time, Place, and Person)  Thought Content:  Logical  Suicidal Thoughts:  No  Homicidal Thoughts:  No  Memory:  Immediate;   Fair Recent;   Fair Remote;   Fair  Judgement:  Intact  Insight:  Fair  Psychomotor Activity:  Normal  Concentration:  Concentration: Fair and  Attention Span: Fair  Recall:  [employer] of Knowledge:  Fair  Language:  Good  Akathisia:  Negative  Handed:  Right  AIMS (if indicated):     Assets:  Desire for Improvement Resilience  ADL's:  Intact  Cognition:  WNL  Sleep:  Number of Hours: 8.25     Treatment Plan Summary: Daily contact with patient to assess and evaluate symptoms and progress in treatment, Medication management and Plan : Patient is seen and examined.  Patient is a 54 year old male with the above-stated past psychiatric history is seen in follow-up.  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  He is doing fine from an alcohol-related standpoint.  He has agreed to go to the [facility], and will most likely leave on Monday.  No change in his current medications.  Hopefully everything will continue to go well through the weekend.  1.  Continue Lexapro 10 mg p.o. daily for depression and anxiety. 2.  Continue folic acid 1 mg p.o. daily for nutritional supplementation. 3.  Continue gabapentin 200 mg p.o. 3 times daily for chronic pain and anxiety as well as mood stability. 4.  Continue lorazepam 1 mg p.o. every 6 hours as needed a CIWA greater than 10. 5.  Continue hydroxyzine 25 mg p.o. every 6 hours as needed anxiety. 6.  Continue a coated aspirin 81 mg p.o. daily for heart health. 7.  Continue Protonix 40 mg p.o. daily for gastric protection. 8.  Continue Carafate 1 g p.o. 3 times daily before meals and at bedtime for gastric protection. 9.  Continue thiamine 100 mg p.o. daily for nutritional supplementation. 10.  Continue trazodone 50 mg p.o. nightly as needed insomnia. 11.  Disposition planning-probable discharge to the [facility] rescue mission on 07/23/2019. Sharma Covert,  MD 07/20/2019, 10:25 AM

## 2019-07-20 NOTE — Progress Notes (Signed)
   07/20/19 2100  Psych Admission Type (Psych Patients Only)  Admission Status Voluntary  Psychosocial Assessment  Patient Complaints Anxiety  Eye Contact Brief  Facial Expression Anxious;Wide-eyed  Affect Anxious;Depressed  Speech Logical/coherent  Interaction Assertive  Motor Activity Slow  Appearance/Hygiene In hospital gown  Behavior Characteristics Cooperative  Mood Anxious  Thought Process  Coherency WDL  Content WDL  Delusions None reported or observed  Perception Hallucinations  Hallucination Auditory  Judgment Poor  Confusion None  Danger to Self  Current suicidal ideation? Denies  Danger to Others  Danger to Others None reported or observed

## 2019-07-20 NOTE — BHH Group Notes (Signed)
LCSW Aftercare Discharge Planning Group Note  07/20/2019   Type of Group and Topic: Psychoeducational Group: Discharge Planning  Participation Level: Did Not Attend  Description of Group  Discharge planning group reviews patient's anticipated discharge plans and assists patients to anticipate and address any barriers to wellness/recovery in the community. Suicide prevention education is reviewed with patients in group.  Therapeutic Goals  1. Patients will state their anticipated discharge plan and mental health aftercare  2. Patients will identify potential barriers to wellness in the community setting  3. Patients will engage in problem solving, solution focused discussion of ways to anticipate and address barriers to wellness/recovery  Summary of Patient Progress  Plan for Discharge/Comments:  Transportation Means:  Supports:  Therapeutic Modalities:  [place], [place]  07/20/2019 2:02 PM

## 2019-07-20 NOTE — Progress Notes (Signed)
Recreation Therapy Notes  Date:  2.12.21 Time: 0930 Location: [address] Hall Group Room  Group Topic: Stress Management  Goal Area(s) Addresses:  Patient will identify positive stress management techniques. Patient will identify benefits of using stress management post d/c.  Intervention: Stress Management  Activity : Meditation.  LRT played a meditation that focused on choosing to make positive choices.  Patients were to listen and follow along as meditation played.  Education:  Stress Management, Discharge Planning.   Education Outcome: Acknowledges Education  Clinical Observations/Feedback: Pt did not attend activity.    Victorino Sparrow, LRT/CTRS         Ria Comment, [place] A 07/20/2019 11:04 AM

## 2019-07-20 NOTE — Progress Notes (Signed)
Patient did not attend AA group meeting because he was asleep.

## 2019-07-20 NOTE — Tx Team (Signed)
Interdisciplinary Treatment and Diagnostic Plan Update  07/20/2019 Time of Session: 9:00am [name] [name] MRN: [id number]  Principal Diagnosis: MDD (major depressive disorder), recurrent episode, severe ([place])  Secondary Diagnoses: Principal Problem:   MDD (major depressive disorder), recurrent episode, severe ([place]) Active Problems:   Alcohol use disorder, severe, dependence ([place])   Current Medications:  Current Facility-Administered Medications  Medication Dose Route Frequency Provider Last Rate Last Admin  . acetaminophen (TYLENOL) tablet 650 mg  650 mg Oral Q6H PRN Sharma Covert, MD      . alum & mag hydroxide-simeth (MAALOX/MYLANTA) 200-200-20 MG/5ML suspension 30 mL  30 mL Oral Q4H PRN Sharma Covert, MD      . escitalopram (LEXAPRO) tablet 10 mg  10 mg Oral Daily Sharma Covert, MD   10 mg at 07/20/19 0759  . folic acid (FOLVITE) tablet 1 mg  1 mg Oral Daily Sharma Covert, MD   1 mg at 07/20/19 0759  . gabapentin (NEURONTIN) capsule 200 mg  200 mg Oral TID Sharma Covert, MD   200 mg at 07/20/19 0759  . hydrOXYzine (ATARAX/VISTARIL) tablet 25 mg  25 mg Oral Q6H PRN Sharma Covert, MD      . LORazepam (ATIVAN) tablet 1 mg  1 mg Oral Q6H PRN Sharma Covert, MD      . magnesium hydroxide (MILK OF MAGNESIA) suspension 30 mL  30 mL Oral Daily PRN Sharma Covert, MD      . pantoprazole (PROTONIX) EC tablet 40 mg  40 mg Oral Daily Sharma Covert, MD   40 mg at 07/20/19 0759  . sucralfate (CARAFATE) tablet 1 g  1 g Oral TID WC & HS Sharma Covert, MD   1 g at 07/20/19 0800  . thiamine (B-1) injection 100 mg  100 mg Intravenous Daily Sharma Covert, MD      . thiamine tablet 100 mg  100 mg Oral Daily Sharma Covert, MD   100 mg at 07/20/19 0759  . traZODone (DESYREL) tablet 50 mg  50 mg Oral QHS PRN Sharma Covert, MD       PTA Medications: Medications Prior to Admission  Medication Sig Dispense Refill Last Dose  . escitalopram  (LEXAPRO) 10 MG tablet Take 1 tablet (10 mg total) by mouth daily. (Patient not taking: Reported on 07/19/2019) 30 tablet 0 Not Taking at Unknown time  . gabapentin (NEURONTIN) 100 MG capsule Take 2 capsules (200 mg total) by mouth 3 (three) times daily. (Patient not taking: Reported on 07/19/2019) 180 capsule 0 Not Taking at Unknown time  . pantoprazole (PROTONIX) 40 MG tablet Take 1 tablet (40 mg total) by mouth daily. (Patient not taking: Reported on 07/19/2019) 30 tablet 0 Not Taking at Unknown time  . traZODone (DESYREL) 50 MG tablet Take 1 tablet (50 mg total) by mouth at bedtime as needed for sleep. (Patient not taking: Reported on 07/19/2019) 30 tablet 0 Not Taking at Unknown time    Patient Stressors:    Patient Strengths:    Treatment Modalities: Medication Management, Group therapy, Case management,  1 to 1 session with clinician, Psychoeducation, Recreational therapy.   Physician Treatment Plan for Primary Diagnosis: MDD (major depressive disorder), recurrent episode, severe ([place]) Long Term Goal(s): Improvement in symptoms so as ready for discharge Improvement in symptoms so as ready for discharge   Short Term Goals: Ability to identify changes in lifestyle to reduce recurrence of condition will improve Ability to verbalize feelings will  improve Ability to disclose and discuss suicidal ideas Ability to demonstrate self-control will improve Ability to identify and develop effective coping behaviors will improve Ability to maintain clinical measurements within normal limits will improve Compliance with prescribed medications will improve Ability to identify triggers associated with substance abuse/mental health issues will improve Ability to identify changes in lifestyle to reduce recurrence of condition will improve Ability to verbalize feelings will improve Ability to disclose and discuss suicidal ideas Ability to demonstrate self-control will improve Ability to identify and  develop effective coping behaviors will improve Ability to maintain clinical measurements within normal limits will improve Compliance with prescribed medications will improve Ability to identify triggers associated with substance abuse/mental health issues will improve  Medication Management: Evaluate patient's response, side effects, and tolerance of medication regimen.  Therapeutic Interventions: 1 to 1 sessions, Unit Group sessions and Medication administration.  Evaluation of Outcomes: Progressing  Physician Treatment Plan for Secondary Diagnosis: Principal Problem:   MDD (major depressive disorder), recurrent episode, severe ([place]) Active Problems:   Alcohol use disorder, severe, dependence ([place])  Long Term Goal(s): Improvement in symptoms so as ready for discharge Improvement in symptoms so as ready for discharge   Short Term Goals: Ability to identify changes in lifestyle to reduce recurrence of condition will improve Ability to verbalize feelings will improve Ability to disclose and discuss suicidal ideas Ability to demonstrate self-control will improve Ability to identify and develop effective coping behaviors will improve Ability to maintain clinical measurements within normal limits will improve Compliance with prescribed medications will improve Ability to identify triggers associated with substance abuse/mental health issues will improve Ability to identify changes in lifestyle to reduce recurrence of condition will improve Ability to verbalize feelings will improve Ability to disclose and discuss suicidal ideas Ability to demonstrate self-control will improve Ability to identify and develop effective coping behaviors will improve Ability to maintain clinical measurements within normal limits will improve Compliance with prescribed medications will improve Ability to identify triggers associated with substance abuse/mental health issues will improve     Medication  Management: Evaluate patient's response, side effects, and tolerance of medication regimen.  Therapeutic Interventions: 1 to 1 sessions, Unit Group sessions and Medication administration.  Evaluation of Outcomes: Progressing   RN Treatment Plan for Primary Diagnosis: MDD (major depressive disorder), recurrent episode, severe ([place]) Long Term Goal(s): Knowledge of disease and therapeutic regimen to maintain health will improve  Short Term Goals: Ability to participate in decision making will improve and Ability to identify and develop effective coping behaviors will improve  Medication Management: RN will administer medications as ordered by provider, will assess and evaluate patient's response and provide education to patient for prescribed medication. RN will report any adverse and/or side effects to prescribing provider.  Therapeutic Interventions: 1 on 1 counseling sessions, Psychoeducation, Medication administration, Evaluate responses to treatment, Monitor vital signs and CBGs as ordered, Perform/monitor CIWA, COWS, AIMS and Fall Risk screenings as ordered, Perform wound care treatments as ordered.  Evaluation of Outcomes: Progressing   LCSW Treatment Plan for Primary Diagnosis: MDD (major depressive disorder), recurrent episode, severe ([place]) Long Term Goal(s): Safe transition to appropriate next level of care at discharge, Engage patient in therapeutic group addressing interpersonal concerns.  Short Term Goals: Engage patient in aftercare planning with referrals and resources, Increase social support, Identify triggers associated with mental health/substance abuse issues and Increase skills for wellness and recovery  Therapeutic Interventions: Assess for all discharge needs, 1 to 1 time with Social worker, Explore available  resources and support systems, Assess for adequacy in community support network, Educate family and significant other(s) on suicide prevention, Complete Psychosocial  Assessment, Interpersonal group therapy.  Evaluation of Outcomes: Progressing  Progress in Treatment: Attending groups: No. New to unit. Participating in groups: No. Taking medication as prescribed: Yes. Toleration medication: Yes. Family/Significant other contact made: No, will contact:  supports if consents are granted. Patient understands diagnosis: Yes. Discussing patient identified problems/goals with staff: Yes. Medical problems stabilized or resolved: Yes. Denies suicidal/homicidal ideation: Yes. Issues/concerns per patient self-inventory: Yes.  New problem(s) identified: No, Describe:  CSW assessing.   New Short Term/Long Term Goal(s): detox, medication management for mood stabilization; elimination of SI thoughts; development of comprehensive mental wellness/sobriety plan.  Patient Goals: "Find long term treatment."  Discharge Plan or Barriers: Agreeable to discharge to [facility] on Monday to [facility] the [facility].   Reason for Continuation of Hospitalization: Anxiety Depression Withdrawal symptoms  Estimated Length of Stay: 3 days  Attendees: Patient: [name] [name] 07/20/2019 9:36 AM  Physician: Queen Blossom 07/20/2019 9:36 AM  Nursing:  07/20/2019 9:36 AM  RN Care Manager: 07/20/2019 9:36 AM  Social Worker: Stephanie Acre, Latanya Presser 07/20/2019 9:36 AM  Recreational Therapist:  07/20/2019 9:36 AM  Other:  07/20/2019 9:36 AM  Other:  07/20/2019 9:36 AM  Other: 07/20/2019 9:36 AM    Scribe for Treatment Team: Joellen [place], [place] 07/20/2019 9:36 AM
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
## 2019-07-20 NOTE — Plan of Care (Signed)
Progress note  D: pt found in bed; compliant with medication administration. Pt has complaints of nausea and poor appetite. Pt is animated during assessment but minimal. Pt denies si/hi/ah/vh and verbally agrees to approach staff if these become apparent or before harming themself/others while at [place]: Pt provided support and encouragement. Pt given medication per protocol and standing orders. Q29m safety checks implemented and continued.  R: Pt safe on the unit. Will continue to monitor.  Pt progressing in the following metrics  Problem: Education: Goal: Knowledge of [place] [facility] Education information/materials will improve Outcome: Progressing Goal: Emotional status will improve Outcome: Progressing Goal: Mental status will improve Outcome: Progressing

## 2019-07-21 DIAGNOSIS — F332 Major depressive disorder, recurrent severe without psychotic features: Principal | ICD-10-CM

## 2019-07-21 DIAGNOSIS — F1020 Alcohol dependence, uncomplicated: Secondary | ICD-10-CM

## 2019-07-21 NOTE — BHH Group Notes (Signed)
[place] Group Notes: (Clinical Social Work)   07/21/2019      Type of Therapy:  Group Therapy   Participation Level:  Did Not Attend - was invited both individually by MHT and by overhead announcement, chose not to attend.   Selmer Dominion, LCSW 07/21/2019, 11:20 AM

## 2019-07-21 NOTE — BHH Suicide Risk Assessment (Signed)
[place] INPATIENT:  Family/Significant Other Suicide Prevention Education  Suicide Prevention Education:  Education Completed; mother, [name] [name] [phone]) and sister [name] [name],  (name of family member/significant other) has been identified by the patient as the family member/significant other with whom the patient will be residing, and identified as the person(s) who will aid the patient in the event of a mental health crisis (suicidal ideations/suicide attempt).  With written consent from the patient, the family member/significant other has been provided the following suicide prevention education, prior to the and/or following the discharge of the patient.  Family expressed concern that patient needs to go to a treatment program for help.  CSW shared with them the plan for patient to either go to [facility] or [facility] if accepted, and otherwise to go to the [facility].  They inquired about transportation, saying that they cannot assist with this. Sister [name] [name] provided her phone number just in case she is needed [phone] to talk to/about patient.  CSW provided phone number for any other questions.  The suicide prevention education provided includes the following:  Suicide risk factors  Suicide prevention and interventions  National Suicide Hotline telephone number  [facility] assessment telephone number  [facility] Emergency Assistance [place] and/or Residential Mobile Crisis Unit telephone number  Request made of family/significant other to:  Remove weapons (e.g., guns, rifles, knives), all items previously/currently identified as safety concern.    Remove drugs/medications (over-the-counter, prescriptions, illicit drugs), all items previously/currently identified as a safety concern.  The family member/significant other verbalizes understanding of the suicide prevention education information provided.  The family  member/significant other agrees to remove the items of safety concern listed above.  [name] [name] 07/21/2019, 4:57 PM

## 2019-07-21 NOTE — BHH Group Notes (Signed)
Adult Psychoeducational Group Note  Date:  07/21/2019 Time:  3:37 PM  Group Topic/Focus:  Identifying Needs:   The focus of this group is to help patients identify their personal needs that have been historically problematic and identify healthy behaviors to address their needs.  Participation Level:  Did Not Attend  Paulino Rily 07/21/2019, 3:37 PM

## 2019-07-21 NOTE — Progress Notes (Signed)
D:  Pt Denied having SI/HI/AVH.  Pt Stated that he did not sleep well and his appetite has been poor.   A: Medications administered per MD orders.  Emotional support and reassurance provided.  Q15 min safety checks remain in place.  R: Pt is calm and pleasant during discussions with RN. RN observed pt spending time in his room. Pt is not wanting to attend group sessions even when pt is encouraged to attend.  Pt remains safe on the unit.  RN will continue to monitor and provide support as needed.

## 2019-07-21 NOTE — Progress Notes (Signed)
[facility] MD Progress Note  07/21/2019 2:32 PM [name] [name]  MRN:  [id number]  Subjective: [name] [name] reports, "My mood is kind of down this morning, that's about it".  Objective: Patient is a 54 year old male with a past psychiatric history significant for alcohol dependence and substance-induced mood disorder who was brought directly to the behavioral health hospital on 07/18/2019 by [facility] police secondary to suicidal ideation. [name] [name] is seen, chart reviewed. The chart findings dicussed with the treatment team. He is alert, oriented, visible on the unit attending group sessions.  He denies any withdrawal symptoms today. He stated previously that he felt as though he needed a 90-day program.  He stated he has been in multiple 30-day programs, but he continues to go back to drinking.  Social work discussed with him yesterday the possibility of the [facility].  Social work contacted the [facility] and they had beds available today, but the patient felt as though he wanted to wait until Monday given possible alcohol withdrawal symptoms.  He deniedsany nausea, vomiting, fever, chills, chest pain or shortness of breath.  His vital signs are stable, he is afebrile.  He slept 8.25 hours last night.    Laboratories from previous day showed essentially normal electrolytes including his liver function enzymes.  His CBC was essentially normal.  TSH was 1.707.  Drug screen has not yet been obtained.  His EKG showed a sinus bradycardia with early repolarization.  QTc was normal.  He denies any suicidal or homicidal ideation. Patient is likely to be discharged to the [facility] coming Monday. He is agreement to continue his current plan of care  As already progress.  Principal Problem: MDD (major depressive disorder), recurrent episode, severe ([place])  Diagnosis: Principal Problem:   MDD (major depressive disorder), recurrent episode, severe ([place]) Active Problems:   Alcohol use disorder,  severe, dependence ([place])  Total Time spent with patient: 15 minutes  Past Psychiatric History: See admission H&P  Past Medical History:  Past Medical History:  Diagnosis Date  . Abnormal ECG    a. early repolarization  . Arthritis    "my whole left side" (05/03/2014)  . Bipolar disorder ([place])   . Bradycardia    a. asymptomatic  . CAD in native artery    a. Nonobstructive cath 11/2007;  b. Presented with ST elevation - Nonobstructive cath 08/2011  . Chest pain, mid sternal   . Coronary artery disease   . GERD (gastroesophageal reflux disease)   . History of cocaine abuse ([place])    a. quit ? 2009  . History of ETOH abuse    a. drinks 2 "40's" / wk  . Marijuana abuse    a. uses ~ 1x /wk or less  . Pneumonia   . Stomach ulcer   . Syncope    a. 12/2010 - presumed to be vasovagal  . Tobacco abuse     Past Surgical History:  Procedure Laterality Date  . CARDIAC CATHETERIZATION  2009; 08/2011   [name] 08/06/2011  . LEFT HEART CATHETERIZATION WITH CORONARY ANGIOGRAM N/A 08/16/2011   Procedure: LEFT HEART CATHETERIZATION WITH CORONARY ANGIOGRAM;  Surgeon: [name], MD;  Location: [facility] CATH LAB;  Service: Cardiovascular;  Laterality: N/A;   Family History: History reviewed. No pertinent family history.  Family Psychiatric  History: See admission H&P  Social History:  Social History   Substance and Sexual Activity  Alcohol Use Yes  . Alcohol/week: 48.0 standard drinks  . Types: 48 Cans of  beer per week   Comment: 2 40 oz beers per day     Social History   Substance and Sexual Activity  Drug Use Yes  . Types: Marijuana, Cocaine    Social History   Socioeconomic History  . Marital status: Single    Spouse name: Not on file  . Number of children: Not on file  . Years of education: Not on file  . Highest education level: Not on file  Occupational History  . Not on file  Tobacco Use  . Smoking status: Current Some Day Smoker    Packs/day: 0.50    Years: 20.00     Pack years: 10.00    Types: Cigarettes    Last attempt to quit: 06/24/2015    Years since quitting: 4.0  . Smokeless tobacco: Never Used  Substance and Sexual Activity  . Alcohol use: Yes    Alcohol/week: 48.0 standard drinks    Types: 48 Cans of beer per week    Comment: 2 40 oz beers per day  . Drug use: Yes    Types: Marijuana, Cocaine  . Sexual activity: Yes  Other Topics Concern  . Not on file  Social History Narrative   Lives with his daughter currently and receives SSI.    Social Determinants of Health   Financial Resource Strain:   . Difficulty of Paying Living Expenses: Not on file  Food Insecurity:   . Worried About Charity fundraiser in the Last Year: Not on file  . Ran Out of Food in the Last Year: Not on file  Transportation Needs:   . Lack of Transportation (Medical): Not on file  . Lack of Transportation (Non-Medical): Not on file  Physical Activity:   . Days of Exercise per Week: Not on file  . Minutes of Exercise per Session: Not on file  Stress:   . Feeling of Stress : Not on file  Social Connections:   . Frequency of Communication with Friends and Family: Not on file  . Frequency of Social Gatherings with Friends and Family: Not on file  . Attends Religious Services: Not on file  . Active Member of Clubs or Organizations: Not on file  . Attends Archivist Meetings: Not on file  . Marital Status: Not on file   Additional Social History:   Sleep: Good  Appetite:  Good  Current Medications: Current Facility-Administered Medications  Medication Dose Route Frequency Provider Last Rate Last Admin  . acetaminophen (TYLENOL) tablet 650 mg  650 mg Oral Q6H PRN Sharma Covert, MD      . alum & mag hydroxide-simeth (MAALOX/MYLANTA) 200-200-20 MG/5ML suspension 30 mL  30 mL Oral Q4H PRN Sharma Covert, MD      . escitalopram (LEXAPRO) tablet 10 mg  10 mg Oral Daily Sharma Covert, MD   10 mg at 07/21/19 0830  . folic acid (FOLVITE)  tablet 1 mg  1 mg Oral Daily Sharma Covert, MD   1 mg at 07/20/19 0759  . gabapentin (NEURONTIN) capsule 200 mg  200 mg Oral TID Sharma Covert, MD   200 mg at 07/21/19 1157  . hydrOXYzine (ATARAX/VISTARIL) tablet 25 mg  25 mg Oral Q6H PRN Sharma Covert, MD      . LORazepam (ATIVAN) tablet 1 mg  1 mg Oral Q6H PRN Sharma Covert, MD      . magnesium hydroxide (MILK OF MAGNESIA) suspension 30 mL  30 mL Oral Daily PRN  Sharma Covert, MD      . pantoprazole (PROTONIX) EC tablet 40 mg  40 mg Oral Daily Sharma Covert, MD   40 mg at 07/21/19 0830  . sucralfate (CARAFATE) tablet 1 g  1 g Oral TID WC & HS Sharma Covert, MD   1 g at 07/21/19 1157  . thiamine (B-1) injection 100 mg  100 mg Intravenous Daily Sharma Covert, MD      . thiamine tablet 100 mg  100 mg Oral Daily Sharma Covert, MD   100 mg at 07/20/19 0759  . traZODone (DESYREL) tablet 50 mg  50 mg Oral QHS PRN Sharma Covert, MD       Lab Results:  Results for orders placed or performed during the hospital encounter of 07/19/19 (from the past 48 hour(s))  Urinalysis, Routine w reflex microscopic     Status: None   Collection Time: 07/19/19  6:03 PM  Result Value Ref Range   Color, Urine YELLOW YELLOW   APPearance CLEAR CLEAR   Specific Gravity, Urine 1.015 1.005 - 1.030   pH 5.0 5.0 - 8.0   Glucose, UA NEGATIVE NEGATIVE mg/dL   Hgb urine dipstick NEGATIVE NEGATIVE   Bilirubin Urine NEGATIVE NEGATIVE   Ketones, ur NEGATIVE NEGATIVE mg/dL   Protein, ur NEGATIVE NEGATIVE mg/dL   Nitrite NEGATIVE NEGATIVE   Leukocytes,Ua NEGATIVE NEGATIVE    Comment: Performed at [facility] [address], [place], [place] [address]  CBC     Status: None   Collection Time: 07/19/19  6:07 PM  Result Value Ref Range   WBC 4.5 4.0 - 10.5 K/uL   RBC 4.79 4.22 - 5.81 MIL/uL   Hemoglobin 15.2 13.0 - 17.0 g/dL   HCT 44.0 39.0 - 52.0 %   MCV 91.9 80.0 - 100.0 fL   MCH 31.7 26.0 - 34.0 pg    MCHC 34.5 30.0 - 36.0 g/dL   RDW 13.2 11.5 - 15.5 %   Platelets 262 150 - 400 K/uL   nRBC 0.0 0.0 - 0.2 %    Comment: Performed at [facility], [place] [address] [place], [place] [address]  Comprehensive metabolic panel     Status: Abnormal   Collection Time: 07/19/19  6:07 PM  Result Value Ref Range   Sodium 137 135 - 145 mmol/L   Potassium 4.0 3.5 - 5.1 mmol/L   Chloride 106 98 - 111 mmol/L   CO2 22 22 - 32 mmol/L   Glucose, Bld 119 (H) 70 - 99 mg/dL   BUN 12 6 - 20 mg/dL   Creatinine, Ser 1.04 0.61 - 1.24 mg/dL   Calcium 9.2 8.9 - 10.3 mg/dL   Total Protein 7.6 6.5 - 8.1 g/dL   Albumin 4.2 3.5 - 5.0 g/dL   AST 28 15 - 41 U/L   ALT 21 0 - 44 U/L   Alkaline Phosphatase 75 38 - 126 U/L   Total Bilirubin 1.0 0.3 - 1.2 mg/dL   GFR calc non Af Amer >60 >60 mL/min   GFR calc Af Amer >60 >60 mL/min   Anion gap 9 5 - 15    Comment: Performed at [facility], [place] [address], [place],  [address]  Hemoglobin A1c     Status: None   Collection Time: 07/19/19  6:07 PM  Result Value Ref Range   Hgb A1c MFr Bld 4.8 4.8 - 5.6 %    Comment: (NOTE) Pre diabetes:  5.7%-6.4% Diabetes:              >6.4% Glycemic control for   <7.0% adults with diabetes    Mean Plasma Glucose 91.06 mg/dL    Comment: Performed at [facility] [address] [place], [place] [address]  TSH     Status: None   Collection Time: 07/19/19  6:07 PM  Result Value Ref Range   TSH 1.707 0.350 - 4.500 uIU/mL    Comment: Performed by a 3rd Generation assay with a functional sensitivity of <=0.01 uIU/mL. Performed at [facility], [place] [address] [place], [place] [address]    Blood Alcohol level:  Lab Results  Component Value Date   ETH 132 (H) 03/23/2019   ETH 198 (H) [id number]    Metabolic Disorder Labs: Lab Results  Component Value Date   HGBA1C 4.8 07/19/2019   MPG 91.06 07/19/2019   MPG 97 03/25/2019   No results found for:  PROLACTIN Lab Results  Component Value Date   CHOL 208 (H) 03/25/2019   TRIG 97 03/25/2019   HDL 91 03/25/2019   CHOLHDL 2.3 03/25/2019   VLDL 19 03/25/2019   LDLCALC 98 03/25/2019   LDLCALC 74 05/04/2016    Physical Findings: AIMS: Facial and Oral Movements Muscles of Facial Expression: None, normal Lips and Perioral Area: None, normal Jaw: None, normal Tongue: None, normal,Extremity Movements Upper (arms, wrists, hands, fingers): None, normal Lower (legs, knees, ankles, toes): None, normal, Trunk Movements Neck, shoulders, hips: None, normal, Overall Severity Severity of abnormal movements (highest score from questions above): None, normal Incapacitation due to abnormal movements: None, normal Patient's awareness of abnormal movements (rate only patient's report): No Awareness, Dental Status Current problems with teeth and/or dentures?: No Does patient usually wear dentures?: No  CIWA:  CIWA-Ar Total: 0 COWS:  COWS Total Score: 0  Musculoskeletal: Strength & Muscle Tone: within normal limits Gait & Station: normal Patient leans: N/A  Psychiatric Specialty Exam: Physical Exam  Nursing note and vitals reviewed. Constitutional: He is oriented to person, place, and time. He appears well-developed and well-nourished.  HENT:  Head: Normocephalic and atraumatic.  Cardiovascular: Normal rate.  Respiratory: Effort normal.  Genitourinary:    Genitourinary Comments: Deferred   Musculoskeletal:        General: Normal range of motion.     Cervical back: Normal range of motion.  Neurological: He is alert and oriented to person, place, and time.  Skin: Skin is warm and dry.    Review of Systems  Constitutional: Negative for chills, diaphoresis and fever.  HENT: Negative for congestion, rhinorrhea, sneezing and sore throat.   Respiratory: Negative for cough, shortness of breath and wheezing.   Cardiovascular: Negative for chest pain and palpitations.  Gastrointestinal:  Negative for diarrhea, nausea and vomiting.  Genitourinary: Negative for difficulty urinating.  Skin: Negative for color change.  Psychiatric/Behavioral: Positive for dysphoric mood and sleep disturbance.    Blood pressure 125/89, pulse (!) 59, temperature 98 F (36.7 C), temperature source Oral, resp. rate 20, SpO2 98 %.There is no height or weight on file to calculate BMI.  General Appearance: Casual  Eye Contact:  Fair  Speech:  Normal Rate  Volume:  Normal  Mood:  Euthymic  Affect:  Congruent  Thought Process:  Coherent and Descriptions of Associations: Intact  Orientation:  Full (Time, Place, and Person)  Thought Content:  Logical  Suicidal Thoughts:  No  Homicidal Thoughts:  No  Memory:  Immediate;  Fair Recent;   Fair Remote;   Fair  Judgement:  Intact  Insight:  Fair  Psychomotor Activity:  Normal  Concentration:  Concentration: Fair and Attention Span: Fair  Recall:  [employer] of Knowledge:  Fair  Language:  Good  Akathisia:  Negative  Handed:  Right  AIMS (if indicated):     Assets:  Desire for Improvement Resilience  ADL's:  Intact  Cognition:  WNL  Sleep:  Number of Hours: 6.25   Treatment Plan Summary: Daily contact with patient to assess and evaluate symptoms and progress in treatment, Medication management and Plan : Patient is seen and examined.  Patient is a 54 year old male with the above-stated past psychiatric history is seen in follow-up.   -Continue inpatient hospitalization.  -Will continue today 07/21/2019 plan as below except where it is noted.  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  He is doing fine from an alcohol-related standpoint.  He has agreed to go to the [facility], and will most likely leave  on Monday.  No change in his current medications.  Hopefully everything will continue to go well through the weekend.  1.  Continue Lexapro 10 mg p.o. daily for depression and anxiety. 2.  Continue folic acid 1 mg p.o. daily for nutritional supplementation. 3.  Continue gabapentin 200 mg p.o. 3 times daily for chronic pain and anxiety as well as mood stability. 4.  Continue lorazepam 1 mg p.o. every 6 hours as needed a CIWA greater than 10. 5.   Continue hydroxyzine 25 mg p.o. every 6 hours as needed anxiety. 6.   Continue a coated aspirin 81 mg p.o. daily for heart health. 7.   Continue Protonix 40 mg p.o. daily for gastric protection. 8.   Continue Carafate 1 g p.o. 3 times daily before meals and at bedtime for gastric protection. 9.    Continue thiamine 100 mg p.o. daily for nutritional supplementation. 10.  Continue trazodone 50 mg p.o. nightly as needed insomnia. 11.  Disposition planning-probable discharge to the [facility] rescue mission on 07/23/2019.  [name] Spar, NP, PMHNP, FNP-BC 07/21/2019, 2:32 PMPatient ID: [name], male   DOB: 17-Jun-1965, 54 y.o.   MRN: [id number]

## 2019-07-21 NOTE — [facility] Group Notes (Signed)
Adult Psychoeducational Group Note  Date:  07/21/2019 Time:  3:34 PM  Group Topic/Focus:  Goals Group:   The focus of this group is to help patients establish daily goals to achieve during treatment and discuss how the patient can incorporate goal setting into their daily lives to aide in recovery.  Participation Level:  Minimal  Participation Quality:  Appropriate  Affect:  Appropriate  Cognitive:  Hallucinating  Insight: Lacking  Engagement in Group:  Engaged  Modes of Intervention:  Discussion  Additional Comments:  Did not offer anything except to answer the questions  [name] [name] 07/21/2019, 3:34 PM

## 2019-07-21 NOTE — Progress Notes (Signed)
[place] Group Notes:  (Nursing/MHT/Case Management/Adjunct)  Date:  07/21/2019  Time:  2030  Type of Therapy:  wrap up group  Participation Level:  Active  Participation Quality:  Appropriate, Attentive, Sharing and Supportive  Affect:  Anxious and Appropriate  Cognitive:  Appropriate  Insight:  Improving  Engagement in Group:  Engaged  Modes of Intervention:  Clarification, Education and Support  Summary of Progress/Problems: Pt shared that he feels like his medications are leveling out. Pt plans on keeping on a straight line and being done with alcohol. Pt is grateful for his good physical health.   Shellia Cleverly 07/21/2019, 9:30 PM

## 2019-07-22 MED ORDER — ONDANSETRON HCL 4 MG PO TABS
8.0000 mg | ORAL_TABLET | Freq: Three times a day (TID) | ORAL | Status: DC | PRN
  Administered 2019-07-22: 09:00:00 8 mg via ORAL
  Filled 2019-07-22: qty 2

## 2019-07-22 NOTE — Progress Notes (Signed)
Awake. Complains of some nausea. Ginger Ale . Monitor and support.

## 2019-07-22 NOTE — Progress Notes (Signed)
[facility] MD Progress Note  07/22/2019 1:24 PM [name] [name]  MRN:  [id number]  Subjective: [name] [name] reports, "I'm feeling pretty good today. Things are going great".  Objective: Patient is a 54 year old male with a past psychiatric history significant for alcohol dependence and substance-induced mood disorder who was brought directly to the behavioral health hospital on 07/18/2019 by [facility] police secondary to suicidal ideation. [name] [name] is seen, chart reviewed. The chart findings dicussed with the treatment team. He is alert, oriented, visible on the unit attending group sessions. He denies any withdrawal symptoms today. He denies any new issues or concerns today. He stated previously that he felt as though he needed a 90-day program.  He stated he has been in multiple 30-day programs, but he continues to go back to drinking.  Social work had discussed with him previously the possibility of the [facility].  Social work contacted the [facility] and they had beds available today, but the patient felt as though he wanted to wait until Monday given possible alcohol withdrawal symptoms.  He denies any nausea, vomiting, fever, chills, chest pain or shortness of breath.  His vital signs are stable, he is afebrile.  He slept well last night. Laboratories from previous day showed essentially normal electrolytes including his liver function enzymes.  His CBC was essentially normal.  TSH was 1.707.  Drug screen has not yet been obtained.  His EKG showed a sinus bradycardia with early repolarization.  QTc was normal.  He denies any suicidal or homicidal ideation. Patient is likely to be discharged to the [facility] coming Monday. He is in agreement to continue his current plan of care  As already progress.  Principal Problem: MDD (major depressive disorder), recurrent episode, severe ([place])  Diagnosis: Principal Problem:   MDD (major depressive disorder), recurrent  episode, severe ([place]) Active Problems:   Alcohol use disorder, severe, dependence ([place])  Total Time spent with patient: 15 minutes  Past Psychiatric History: See admission H&P  Past Medical History:  Past Medical History:  Diagnosis Date  . Abnormal ECG    a. early repolarization  . Arthritis    "my whole left side" (05/03/2014)  . Bipolar disorder ([place])   . Bradycardia    a. asymptomatic  . CAD in native artery    a. Nonobstructive cath 11/2007;  b. Presented with ST elevation - Nonobstructive cath 08/2011  . Chest pain, mid sternal   . Coronary artery disease   . GERD (gastroesophageal reflux disease)   . History of cocaine abuse ([place])    a. quit ? 2009  . History of ETOH abuse    a. drinks 2 "40's" / wk  . Marijuana abuse    a. uses ~ 1x /wk or less  . Pneumonia   . Stomach ulcer   . Syncope    a. 12/2010 - presumed to be vasovagal  . Tobacco abuse     Past Surgical History:  Procedure Laterality Date  . CARDIAC CATHETERIZATION  2009; 08/2011   [name] [name] 08/06/2011  . LEFT HEART CATHETERIZATION WITH CORONARY ANGIOGRAM N/A 08/16/2011   Procedure: LEFT HEART CATHETERIZATION WITH CORONARY ANGIOGRAM;  Surgeon: [name], MD;  Location: [facility] CATH LAB;  Service: Cardiovascular;  Laterality: N/A;   Family History: History reviewed. No pertinent family history.  Family Psychiatric  History: See admission H&P  Social History:  Social History   Substance and Sexual Activity  Alcohol Use Yes  . Alcohol/week: 48.0 standard drinks  .  Types: 48 Cans of beer per week   Comment: 2 40 oz beers per day     Social History   Substance and Sexual Activity  Drug Use Yes  . Types: Marijuana, Cocaine    Social History   Socioeconomic History  . Marital status: Single    Spouse name: Not on file  . Number of children: Not on file  . Years of education: Not on file  . Highest education level: Not on file  Occupational History  . Not on file  Tobacco Use  . Smoking  status: Current Some Day Smoker    Packs/day: 0.50    Years: 20.00    Pack years: 10.00    Types: Cigarettes    Last attempt to quit: 06/24/2015    Years since quitting: 4.0  . Smokeless tobacco: Never Used  Substance and Sexual Activity  . Alcohol use: Yes    Alcohol/week: 48.0 standard drinks    Types: 48 Cans of beer per week    Comment: 2 40 oz beers per day  . Drug use: Yes    Types: Marijuana, Cocaine  . Sexual activity: Yes  Other Topics Concern  . Not on file  Social History Narrative   Lives with his daughter currently and receives SSI.    Social Determinants of Health   Financial Resource Strain:   . Difficulty of Paying Living Expenses: Not on file  Food Insecurity:   . Worried About Charity fundraiser in the Last Year: Not on file  . Ran Out of Food in the Last Year: Not on file  Transportation Needs:   . Lack of Transportation (Medical): Not on file  . Lack of Transportation (Non-Medical): Not on file  Physical Activity:   . Days of Exercise per Week: Not on file  . Minutes of Exercise per Session: Not on file  Stress:   . Feeling of Stress : Not on file  Social Connections:   . Frequency of Communication with Friends and Family: Not on file  . Frequency of Social Gatherings with Friends and Family: Not on file  . Attends Religious Services: Not on file  . Active Member of Clubs or Organizations: Not on file  . Attends Archivist Meetings: Not on file  . Marital Status: Not on file   Additional Social History:   Sleep: Good  Appetite:  Good  Current Medications: Current Facility-Administered Medications  Medication Dose Route Frequency Provider Last Rate Last Admin  . acetaminophen (TYLENOL) tablet 650 mg  650 mg Oral Q6H PRN Sharma Covert, MD      . alum & mag hydroxide-simeth (MAALOX/MYLANTA) 200-200-20 MG/5ML suspension 30 mL  30 mL Oral Q4H PRN Sharma Covert, MD      . escitalopram (LEXAPRO) tablet 10 mg  10 mg Oral Daily  Sharma Covert, MD   10 mg at 07/22/19 0827  . folic acid (FOLVITE) tablet 1 mg  1 mg Oral Daily Sharma Covert, MD   1 mg at 07/22/19 0827  . gabapentin (NEURONTIN) capsule 200 mg  200 mg Oral TID Sharma Covert, MD   200 mg at 07/22/19 1203  . hydrOXYzine (ATARAX/VISTARIL) tablet 25 mg  25 mg Oral Q6H PRN Sharma Covert, MD   25 mg at 07/22/19 0827  . LORazepam (ATIVAN) tablet 1 mg  1 mg Oral Q6H PRN Sharma Covert, MD      . magnesium hydroxide (MILK OF MAGNESIA) suspension 30  mL  30 mL Oral Daily PRN Sharma Covert, MD      . ondansetron [facility]) tablet 8 mg  8 mg Oral Q8H PRN Sharma Covert, MD   8 mg at 07/22/19 0845  . pantoprazole (PROTONIX) EC tablet 40 mg  40 mg Oral Daily Sharma Covert, MD   40 mg at 07/22/19 0827  . sucralfate (CARAFATE) tablet 1 g  1 g Oral TID WC & HS Sharma Covert, MD   1 g at 07/22/19 1203  . thiamine (B-1) injection 100 mg  100 mg Intravenous Daily Sharma Covert, MD      . thiamine tablet 100 mg  100 mg Oral Daily Sharma Covert, MD   100 mg at 07/22/19 0827  . traZODone (DESYREL) tablet 50 mg  50 mg Oral QHS PRN Sharma Covert, MD   50 mg at 07/21/19 2032   Lab Results:  No results found for this or any previous visit (from the past 48 hour(s)). Blood Alcohol level:  Lab Results  Component Value Date   ETH 132 (H) 03/23/2019   ETH 198 (H) [id number]   Metabolic Disorder Labs: Lab Results  Component Value Date   HGBA1C 4.8 07/19/2019   MPG 91.06 07/19/2019   MPG 97 03/25/2019   No results found for: PROLACTIN Lab Results  Component Value Date   CHOL 208 (H) 03/25/2019   TRIG 97 03/25/2019   HDL 91 03/25/2019   CHOLHDL 2.3 03/25/2019   VLDL 19 03/25/2019   LDLCALC 98 03/25/2019   LDLCALC 74 05/04/2016   Physical Findings: AIMS: Facial and Oral Movements Muscles of Facial Expression: None, normal Lips and Perioral Area: None, normal Jaw: None, normal Tongue: None, normal,Extremity  Movements Upper (arms, wrists, hands, fingers): None, normal Lower (legs, knees, ankles, toes): None, normal, Trunk Movements Neck, shoulders, hips: None, normal, Overall Severity Severity of abnormal movements (highest score from questions above): None, normal Incapacitation due to abnormal movements: None, normal Patient's awareness of abnormal movements (rate only patient's report): No Awareness, Dental Status Current problems with teeth and/or dentures?: No Does patient usually wear dentures?: No  CIWA:  CIWA-Ar Total: 5 COWS:  COWS Total Score: 0  Musculoskeletal: Strength & Muscle Tone: within normal limits Gait & Station: normal Patient leans: N/A  Psychiatric Specialty Exam: Physical Exam  Nursing note and vitals reviewed. Constitutional: He is oriented to person, place, and time. He appears well-developed and well-nourished.  HENT:  Head: Normocephalic and atraumatic.  Cardiovascular: Normal rate.  Respiratory: Effort normal.  Genitourinary:    Genitourinary Comments: Deferred   Musculoskeletal:        General: Normal range of motion.     Cervical back: Normal range of motion.  Neurological: He is alert and oriented to person, place, and time.  Skin: Skin is warm and dry.    Review of Systems  Constitutional: Negative for chills, diaphoresis and fever.  HENT: Negative for congestion, rhinorrhea, sneezing and sore throat.   Respiratory: Negative for cough, shortness of breath and wheezing.   Cardiovascular: Negative for chest pain and palpitations.  Gastrointestinal: Negative for diarrhea, nausea and vomiting.  Genitourinary: Negative for difficulty urinating.  Skin: Negative for color change.  Psychiatric/Behavioral: Positive for dysphoric mood and sleep disturbance.    Blood pressure 109/82, pulse (!) 55, temperature 97.8 F (36.6 C), temperature source Oral, resp. rate 18, SpO2 95 %.There is no height or weight on file to calculate BMI.  General Appearance:  Casual  Eye Contact:  Fair  Speech:  Normal Rate  Volume:  Normal  Mood:  Euthymic  Affect:  Congruent  Thought Process:  Coherent and Descriptions of Associations: Intact  Orientation:  Full (Time, Place, and Person)  Thought Content:  Logical  Suicidal Thoughts:  No  Homicidal Thoughts:  No  Memory:  Immediate;   Fair Recent;   Fair Remote;   Fair  Judgement:  Intact  Insight:  Fair  Psychomotor Activity:  Normal  Concentration:  Concentration: Fair and Attention Span: Fair  Recall:  [employer] of Knowledge:  Fair  Language:  Good  Akathisia:  Negative  Handed:  Right  AIMS (if indicated):     Assets:  Desire for Improvement Resilience  ADL's:  Intact  Cognition:  WNL  Sleep:  Number of Hours: 5.25   Treatment Plan Summary: Daily contact with patient to assess and evaluate symptoms and progress in treatment, Medication management and Plan : Patient is seen and examined.  Patient is a 54 year old male with the above-stated past psychiatric history is seen in follow-up.   -Continue inpatient hospitalization.  -Will continue today 07/22/2019 plan as below except where it is noted.  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  He is doing fine from an alcohol-related standpoint.  He has agreed to go to the [facility], and will most likely leave on Monday.  No change in his current medications.  Hopefully everything will continue to go well through the weekend.  1.  Continue Lexapro 10 mg p.o. daily for depression and anxiety. 2.  Continue folic acid 1 mg p.o. daily for nutritional supplementation. 3.  Continue gabapentin 200 mg p.o. 3 times daily for chronic pain and anxiety as well as mood stability. 4.  Continue lorazepam 1 mg p.o.  every 6 hours as needed a CIWA greater than 10. 5.   Continue hydroxyzine 25 mg p.o. every 6 hours as needed anxiety. 6.   Continue a coated aspirin 81 mg p.o. daily for heart health. 7.   Continue Protonix 40 mg p.o. daily for gastric protection. 8.   Continue Carafate 1 g p.o. 3 times daily before meals and at bedtime for gastric protection. 9.    Continue thiamine 100 mg p.o. daily for nutritional supplementation. 10.  Continue trazodone 50 mg p.o. nightly as needed insomnia. 11.  Disposition planning-probable discharge to the [facility] rescue mission on 07/23/2019.  Lindell Spar, NP, PMHNP, FNP-BC 07/22/2019, 1:24 PMPatient ID: [name], male   DOB: 26-May-1966, 53 y.o.   MRN: [id number] Patient ID: [name] [name], male   DOB: [birth year], 54 y.o.   MRN: [id number]

## 2019-07-22 NOTE — BHH Group Notes (Signed)
[facility] LCSW Group Therapy Note  07/22/2019  10:00-11:00AM  Type of Therapy and Topic:  Group Therapy:  A Hero Worthy of Support  Participation Level:  Active   Description of Group:  Patients in this group were introduced to the concept that additional supports including self-support are an essential part of recovery.  Matching needs with supports to help fulfill those needs was explained.  Establishing boundaries that can gradually be increased or decreased was described, with patients giving their own examples of establishing appropriate boundaries in their lives.  A song entitled "My Own Hero" was played and a group discussion ensued in which patients stated it inspired them to help themselves in order to succeed, because other people cannot achieve their goals such as sobriety or stability for them.  A song was played called "I Am Enough" which led to a discussion about being willing to believe we are worth the effort of being a self-support.   Therapeutic Goals: 1)  demonstrate the importance of being a key part of one's own support system 2)  discuss various available supports 3)  encourage patient to use music as part of their self-support and focus on goals 4)  elicit ideas from patients about supports that need to be added   Summary of Patient Progress:  The patient expressed that his Higher Power and his 7 children are his healthy supports, while the adults in his family including his sister and parents are unhealthy for him.  He embraced the concept of being a better support to himself.  Therapeutic Modalities:   Motivational Interviewing Activity  Maretta Los

## 2019-07-22 NOTE — Progress Notes (Signed)
Pt reports ongoing depression and anxiety today. Pt denies SI/HI. Pt c/o nausea and irritability today and was given zofran and vistaril at his request.    07/22/19 0800  Psych Admission Type (Psych Patients Only)  Admission Status Voluntary  Psychosocial Assessment  Patient Complaints None  Eye Contact Fair  Facial Expression Animated  Affect Appropriate to circumstance  Speech Logical/coherent  Interaction Assertive  Motor Activity Fidgety  Appearance/Hygiene Unremarkable  Behavior Characteristics Appropriate to situation  Mood Anxious  Aggressive Behavior  Effect No apparent injury  Thought Process  Coherency WDL  Content WDL  Delusions None reported or observed  Perception WDL  Hallucination None reported or observed  Judgment Limited  Confusion None  Danger to Self  Current suicidal ideation? Denies  Self-Injurious Behavior No self-injurious ideation or behavior indicators observed or expressed   Danger to Others Abnormal  Harmful Behavior to others No threats or harm toward other people

## 2019-07-22 NOTE — Progress Notes (Signed)
   07/21/19 2000  Psychosocial Assessment  Patient Complaints None  Eye Contact Fair  Facial Expression Animated  Affect Apprehensive  Speech Logical/coherent  Interaction Assertive  Motor Activity  (WNL)  Appearance/Hygiene Unremarkable  Behavior Characteristics Cooperative  Mood Pleasant  Thought Process  Coherency WDL  Content WDL  Delusions None reported or observed  Hallucination None reported or observed  Judgment Limited  Confusion None  Danger to Self  Current suicidal ideation? Denies

## 2019-07-23 MED ORDER — HYDROXYZINE HCL 25 MG PO TABS: 25.0000 mg | ORAL_TABLET | Freq: Four times a day (QID) | ORAL | 0 refills | Status: DC | PRN

## 2019-07-23 MED ORDER — SUCRALFATE 1 G PO TABS: 1.0000 g | ORAL_TABLET | Freq: Three times a day (TID) | ORAL | 0 refills | Status: DC

## 2019-07-23 MED ORDER — GABAPENTIN 100 MG PO CAPS: 200.0000 mg | ORAL_CAPSULE | Freq: Three times a day (TID) | ORAL | 0 refills | Status: DC

## 2019-07-23 MED ORDER — TRAZODONE HCL 50 MG PO TABS: 50.0000 mg | ORAL_TABLET | Freq: Every evening | ORAL | 0 refills | Status: DC | PRN

## 2019-07-23 MED ORDER — PANTOPRAZOLE SODIUM 40 MG PO TBEC: 40.0000 mg | DELAYED_RELEASE_TABLET | Freq: Every day | ORAL | 0 refills | Status: DC

## 2019-07-23 MED ORDER — ESCITALOPRAM OXALATE 10 MG PO TABS: 10.0000 mg | ORAL_TABLET | Freq: Every day | ORAL | 0 refills | Status: DC

## 2019-07-23 NOTE — BHH Counselor (Addendum)
Patient has been offered a treatment bed at [facility] on Wednesday, 02/17 at 11am.  Patient has declined his bed in favor of pursuing longer treatment through [facility]. He will discharge this morning.  Stephanie Acre, MSW, [place] Social Worker [facility]  [phone]

## 2019-07-23 NOTE — BHH Suicide Risk Assessment (Signed)
[facility] Discharge Suicide Risk Assessment   Principal Problem: MDD (major depressive disorder), recurrent episode, severe ([place]) Discharge Diagnoses: Principal Problem:   MDD (major depressive disorder), recurrent episode, severe ([place]) Active Problems:   Alcohol use disorder, severe, dependence ([place])   Total Time spent with patient: 20 minutes  Musculoskeletal: Strength & Muscle Tone: within normal limits Gait & Station: normal Patient leans: N/A  Psychiatric Specialty Exam: Review of Systems  All other systems reviewed and are negative.   Blood pressure (!) 87/72, pulse (!) 53, temperature 97.7 F (36.5 C), resp. rate 20, SpO2 95 %.There is no height or weight on file to calculate BMI.  General Appearance: Casual  Eye Contact::  Fair  Speech:  Normal Rate409  Volume:  Normal  Mood:  Euthymic  Affect:  Congruent  Thought Process:  Coherent and Descriptions of Associations: Intact  Orientation:  Full (Time, Place, and Person)  Thought Content:  Logical  Suicidal Thoughts:  No  Homicidal Thoughts:  No  Memory:  Immediate;   Fair Recent;   Fair Remote;   Fair  Judgement:  Intact  Insight:  Fair  Psychomotor Activity:  Normal  Concentration:  Fair  Recall:  [employer] of Knowledge:Fair  Language: Good  Akathisia:  Negative  Handed:  Right  AIMS (if indicated):     Assets:  Desire for Improvement Resilience  Sleep:  Number of Hours: 5.25  Cognition: WNL  ADL's:  Intact   Mental Status Per Nursing Assessment::   On Admission:  Suicidal ideation indicated by patient, Self-harm thoughts  Demographic Factors:  Male, Low socioeconomic status and Unemployed  Loss Factors: Financial problems/change in socioeconomic status  Historical Factors: Impulsivity  Risk Reduction Factors:   NA  Continued Clinical Symptoms:  Depression:   Comorbid alcohol abuse/dependence Impulsivity Alcohol/Substance Abuse/Dependencies  Cognitive Features That Contribute To Risk:  None     Suicide Risk:  Minimal: No identifiable suicidal ideation.  Patients presenting with no risk factors but with morbid ruminations; may be classified as minimal risk based on the severity of the depressive symptoms  [facility] Follow up.   Contact information: [place] [address] [phone]        [facility] Follow up.   Contact information: [address] [place] [id number] [phone]           Plan Of Care/Follow-up recommendations:  Activity:  ad lib  Sharma Covert, MD 07/23/2019, 7:31 AM

## 2019-07-23 NOTE — Progress Notes (Signed)
Discharge Note:  Patient discharged with ride.  Patient denied SI and HI.  Denied A/V hallucinations.  Suicide prevention information given and discussed with patient who stated he understood and had no questions. Patient stated he received all his belongings, clothing, toiletries, etc.  Patient stated he appreciated all assistance received from [facility] staff.  All required discharge information given to patient at discharge.

## 2019-07-23 NOTE — Discharge Summary (Signed)
Physician Discharge Summary Note  Patient:  [name] [name] is an 54 y.o., male MRN:  [id number] DOB:  1965/11/08 Patient phone:  [phone] (home)  Patient address:   [place] [address],  Total Time spent with patient: 15 minutes  Date of Admission:  07/19/2019 Date of Discharge: 07/23/19  Reason for Admission:  suicidal ideation  Principal Problem: MDD (major depressive disorder), recurrent episode, severe ([place]) Discharge Diagnoses: Principal Problem:   MDD (major depressive disorder), recurrent episode, severe ([place]) Active Problems:   Alcohol use disorder, severe, dependence ([place])   Past Psychiatric History: Patient has a longstanding history with multiple admissions for depression, suicidal ideation, alcohol.  He was previously hospitalized on 03/24/2019.  He has been diagnosed with bipolar disorder in the past, but I suspect that diagnosis is not correct. He does admit to auditory hallucinations, and he has had those in the past.  Past Medical History:  Past Medical History:  Diagnosis Date  . Abnormal ECG    a. early repolarization  . Arthritis    "my whole left side" (05/03/2014)  . Bipolar disorder ([place])   . Bradycardia    a. asymptomatic  . CAD in native artery    a. Nonobstructive cath 11/2007;  b. Presented with ST elevation - Nonobstructive cath 08/2011  . Chest pain, mid sternal   . Coronary artery disease   . GERD (gastroesophageal reflux disease)   . History of cocaine abuse ([place])    a. quit ? 2009  . History of ETOH abuse    a. drinks 2 "40's" / wk  . Marijuana abuse    a. uses ~ 1x /wk or less  . Pneumonia   . Stomach ulcer   . Syncope    a. 12/2010 - presumed to be vasovagal  . Tobacco abuse     Past Surgical History:  Procedure Laterality Date  . CARDIAC CATHETERIZATION  2009; 08/2011   [name] 08/06/2011  . LEFT HEART CATHETERIZATION WITH CORONARY ANGIOGRAM N/A 08/16/2011   Procedure: LEFT HEART CATHETERIZATION WITH CORONARY  ANGIOGRAM;  Surgeon: [name], MD;  Location: [facility];  Service: Cardiovascular;  Laterality: N/A;   Family History: History reviewed. No pertinent family history. Family Psychiatric  History: He stated his sister has a history depression.  Father had a history of alcohol dependence.  He currently lives with his mother. Social History:  Social History   Substance and Sexual Activity  Alcohol Use Yes  . Alcohol/week: 48.0 standard drinks  . Types: 48 Cans of beer per week   Comment: 2 40 oz beers per day     Social History   Substance and Sexual Activity  Drug Use Yes  . Types: Marijuana, Cocaine    Social History   Socioeconomic History  . Marital status: Single    Spouse name: Not on file  . Number of children: Not on file  . Years of education: Not on file  . Highest education level: Not on file  Occupational History  . Not on file  Tobacco Use  . Smoking status: Current Some Day Smoker    Packs/day: 0.50    Years: 20.00    Pack years: 10.00    Types: Cigarettes    Last attempt to quit: 06/24/2015    Years since quitting: 4.0  . Smokeless tobacco: Never Used  Substance and Sexual Activity  . Alcohol use: Yes    Alcohol/week: 48.0 standard drinks    Types: 48 Cans of  beer per week    Comment: 2 40 oz beers per day  . Drug use: Yes    Types: Marijuana, Cocaine  . Sexual activity: Yes  Other Topics Concern  . Not on file  Social History Narrative   Lives with his daughter currently and receives SSI.    Social Determinants of Health   Financial Resource Strain:   . Difficulty of Paying Living Expenses: Not on file  Food Insecurity:   . Worried About Charity fundraiser in the Last Year: Not on file  . Ran Out of Food in the Last Year: Not on file  Transportation Needs:   . Lack of Transportation (Medical): Not on file  . Lack of Transportation (Non-Medical): Not on file  Physical Activity:   . Days of Exercise per Week: Not on file  . Minutes  of Exercise per Session: Not on file  Stress:   . Feeling of Stress : Not on file  Social Connections:   . Frequency of Communication with Friends and Family: Not on file  . Frequency of Social Gatherings with Friends and Family: Not on file  . Attends Religious Services: Not on file  . Active Member of Clubs or Organizations: Not on file  . Attends Archivist Meetings: Not on file  . Marital Status: Not on file    Hospital Course:  From admission H&P: Patient is a 54 year old male with a past psychiatric history significant for alcohol dependence who was brought directly to the behavioral health hospital on 07/18/2019 by [facility] police secondary to suicidal ideation. The patient stated that he had recently been having suicidal thoughts. He stated "a little voice was telling me to take the pills and go to sleep". He stated that his last psychiatric hospitalization was here at the behavioral health hospital on 03/24/2019. His last psychiatric hospitalization and I can find in the electronic medical record and was at [facility] on 05/06/2019. His diagnosis at that time was alcohol dependence with alcohol-induced mood disorder. His discharge medications at that time included fluoxetine and trazodone as well as gabapentin. He has a history of significant GI problems including gastric ulcers and has previously been treated with Protonix as well as Carafate. His discharge medications from our facility in October 2020 included Lexapro, gabapentin, Protonix and trazodone. He admitted to suicidal ideation on admission and his desire to go to a substance rehabilitation facility. It does appear that his last admission here in October of last year he followed up at [facility] residential, but it is unclear whether or not he ever attended there or was sober for any period of time while not being there. He has a history of being admitted to our facility at least 6 or 8 times in the last several  years.  [name] [name] was admitted for alcohol dependence with suicidal ideation. He remained on the [facility] unit for four days. CIWA protocol was started with Ativan PRN CIWA>10 for alcohol withdrawal. He was restarted on Lexapro, Neurontin, Vistaril, and trazodone. Protonix and Carafate were restarted for GERD. He participated in group therapy on the unit. He responded well to treatment with no adverse effects reported. He has shown improved mood, affect, sleep, and interaction. He requested assistance with housing and rehab and has been accepted to the [facility]. He was offered bed at [facility] for rehab but has declined and decided to pursue the [facility] at the [facility] instead. He reports good mood, future-oriented and "looking  forward to better my life." He denies any SI/HI/AVH and contracts for safety. He denies withdrawal symptoms. He is discharging on the medications listed below. He agrees to follow up at [facility] (see below). Patient is provided with prescriptions for medications upon discharge. He is discharging to the [facility] via [place] transportation.  Physical Findings: AIMS: Facial and Oral Movements Muscles of Facial Expression: None, normal Lips and Perioral Area: None, normal Jaw: None, normal Tongue: None, normal,Extremity Movements Upper (arms, wrists, hands, fingers): None, normal Lower (legs, knees, ankles, toes): None, normal, Trunk Movements Neck, shoulders, hips: None, normal, Overall Severity Severity of abnormal movements (highest score from questions above): None, normal Incapacitation due to abnormal movements: None, normal Patient's awareness of abnormal movements (rate only patient's report): No Awareness, Dental Status Current problems with teeth and/or dentures?: No Does patient usually wear dentures?: No  CIWA:  CIWA-Ar Total: 5 COWS:  COWS Total Score: 0  Musculoskeletal: Strength & Muscle Tone: within  normal limits Gait & Station: normal Patient leans: N/A  Psychiatric Specialty Exam: Physical Exam  Nursing note and vitals reviewed. Constitutional: He is oriented to person, place, and time. He appears well-developed and well-nourished.  Respiratory: Effort normal.  Musculoskeletal:        General: Normal range of motion.  Neurological: He is alert and oriented to person, place, and time.    Review of Systems  Constitutional: Negative.   Respiratory: Negative for cough and shortness of breath.   Gastrointestinal: Negative for nausea and vomiting.  Neurological: Negative for tremors and headaches.  Psychiatric/Behavioral: Negative for agitation, behavioral problems, dysphoric mood, hallucinations, self-injury, sleep disturbance and suicidal ideas. The patient is not nervous/anxious and is not hyperactive.     Blood pressure (!) 87/72, pulse (!) 53, temperature 97.7 F (36.5 C), resp. rate 20, SpO2 95 %.There is no height or weight on file to calculate BMI.  See MD's discharge SRA      Has this patient used any form of tobacco in the last 30 days? (Cigarettes, Smokeless Tobacco, Cigars, and/or Pipes)  No  Blood Alcohol level:  Lab Results  Component Value Date   ETH 132 (H) 03/23/2019   ETH 198 (H) [id number]    Metabolic Disorder Labs:  Lab Results  Component Value Date   HGBA1C 4.8 07/19/2019   MPG 91.06 07/19/2019   MPG 97 03/25/2019   No results found for: PROLACTIN Lab Results  Component Value Date   CHOL 208 (H) 03/25/2019   TRIG 97 03/25/2019   HDL 91 03/25/2019   CHOLHDL 2.3 03/25/2019   VLDL 19 03/25/2019   Athena 98 03/25/2019   LDLCALC 74 05/04/2016    See Psychiatric Specialty Exam and Suicide Risk Assessment completed by Attending Physician prior to discharge.  Discharge destination:  Other:  [facility]  Is patient on multiple antipsychotic therapies at discharge:  No   Has Patient had three or more failed trials of antipsychotic  monotherapy by history:  No  Recommended Plan for Multiple Antipsychotic Therapies: NA  Discharge Instructions    Discharge instructions   Complete by: As directed    Patient is instructed to take all prescribed medications as recommended. Report any side effects or adverse reactions to your outpatient psychiatrist. Patient is instructed to abstain from alcohol and illegal drugs while on prescription medications. In the event of worsening symptoms, patient is instructed to call the crisis hotline, 911, or go to the nearest emergency department for evaluation and treatment.  Allergies as of 07/23/2019      Reactions   Aspirin Other (See Comments)   Aggravates ulcer. "Causes chest pain."   Pepperoni [pickled Meat] Other (See Comments)   aggrevates ulcer   Tomato Other (See Comments)   Foods with tomato sauce aggrevate ulcers      Medication List    TAKE these medications     Indication  escitalopram 10 MG tablet Commonly known as: LEXAPRO Take 1 tablet (10 mg total) by mouth daily.  Indication: Major Depressive Disorder   gabapentin 100 MG capsule Commonly known as: NEURONTIN Take 2 capsules (200 mg total) by mouth 3 (three) times daily.  Indication: Neuropathic Pain   hydrOXYzine 25 MG tablet Commonly known as: ATARAX/VISTARIL Take 1 tablet (25 mg total) by mouth every 6 (six) hours as needed for anxiety.  Indication: Feeling Anxious   pantoprazole 40 MG tablet Commonly known as: PROTONIX Take 1 tablet (40 mg total) by mouth daily.  Indication: Gastroesophageal Reflux Disease   sucralfate 1 g tablet Commonly known as: CARAFATE Take 1 tablet (1 g total) by mouth 4 (four) times daily -  with meals and at bedtime.  Indication: Ulcer of the Duodenum   traZODone 50 MG tablet Commonly known as: DESYREL Take 1 tablet (50 mg total) by mouth at bedtime as needed for sleep.  Indication: [place] Follow up.   Why: Bed on 02/17 declined. If you wish to pursue treatment, please call and ask to speak with admissions.  Contact information: [address] [place] [address] [place]. Go to.   Why: Please follow up for medication management and outpatient therapy services. Bring your hospital discharge paperwork, photo ID, and current medications with you. Contact information: [place], [place] [address]  P:  [phone]          Follow-up recommendations: Activity as tolerated. Diet as recommended by primary care physician. Keep all scheduled follow-up appointments as recommended.   Comments:   Patient is instructed to take all prescribed medications as recommended. Report any side effects or adverse reactions to your outpatient psychiatrist. Patient is instructed to abstain from alcohol and illegal drugs while on prescription medications. In the event of worsening symptoms, patient is instructed to call the crisis hotline, 911, or go to the nearest emergency department for evaluation and treatment.  Signed: Connye Burkitt, NP 07/23/2019, 11:26 AM

## 2019-07-23 NOTE — Progress Notes (Signed)
Recreation Therapy Notes  Date:  2.15.21 Time: 0930 Location: [address] Dayroom  Group Topic: Stress Management  Goal Area(s) Addresses:  Patient will identify positive stress management techniques. Patient will identify benefits of using stress management post d/c.  Behavioral Response:  Engaged  Intervention: Stress Management  Activity :  Meditation. LRT played a meditation that focused on letting go of the past and focusing on the present.  Patients were to listen and follow along as the meditation played to engage in activity.  Education:  Stress Management, Discharge Planning.   Education Outcome: Acknowledges Education  Clinical Observations/Feedback: Pt attended and participated in activity.    Victorino Sparrow, LRT/CTRS         Victorino Sparrow A 07/23/2019 11:35 AM

## 2019-07-23 NOTE — Progress Notes (Signed)
D:  Patient denied SI and HI, contracts for safety.  Denied A/V hallucinations.  Denied pain. A:  Medications administered per MD orders.  Emotional support and encouragement given patient. R:  Safety maintained with 15 minute checks.  

## 2019-07-23 NOTE — Progress Notes (Signed)
  [facility] Adult Case Management Discharge Plan :  Will you be returning to the same living situation after discharge:  No. Going to [facility]. At discharge, do you have transportation home?: Yes,  Safe Transportation. Do you have the ability to pay for your medications: Yes,  Medicaid.  Release of information consent forms completed and in the chart.  Patient to Follow up at: [place] Follow up.   Why: Bed on 02/17 declined. If you wish to pursue treatment, please call and ask to speak with admissions.  Contact information: [address] [place] [address] [place]. Go to.   Why: Please follow up for medication management and outpatient therapy services. Bring your hospital discharge paperwork, photo ID, and current medications with you. Contact information: [place], [place] [address]  P:  [phone]          Next level of care provider has access to [facility] and Suicide Prevention discussed: Yes,  with mother and sister.  Has patient been referred to the Quitline?: Patient refused referral  Patient has been referred for addiction treatment: Yes  Joellen Jersey, [place] 07/23/2019, 10:01 AM

## 2019-10-01 ENCOUNTER — Other Ambulatory Visit: Payer: Self-pay

## 2019-10-01 ENCOUNTER — Emergency Department (HOSPITAL_COMMUNITY): Payer: Medicaid Other

## 2019-10-01 ENCOUNTER — Emergency Department (HOSPITAL_COMMUNITY)
Admission: EM | Admit: 2019-10-01 | Discharge: 2019-10-01 | Disposition: A | Discharge status: Home / Self Care | Payer: Medicaid Other | Attending: Emergency Medicine | Admitting: Emergency Medicine

## 2019-10-01 ENCOUNTER — Encounter (HOSPITAL_COMMUNITY): Payer: Self-pay | Admitting: Emergency Medicine

## 2019-10-01 DIAGNOSIS — S299XXA Unspecified injury of thorax, initial encounter: Secondary | ICD-10-CM | POA: Diagnosis present

## 2019-10-01 DIAGNOSIS — F1210 Cannabis abuse, uncomplicated: Secondary | ICD-10-CM | POA: Diagnosis not present

## 2019-10-01 DIAGNOSIS — Y929 Unspecified place or not applicable: Secondary | ICD-10-CM | POA: Insufficient documentation

## 2019-10-01 DIAGNOSIS — Y939 Activity, unspecified: Secondary | ICD-10-CM | POA: Insufficient documentation

## 2019-10-01 DIAGNOSIS — F1410 Cocaine abuse, uncomplicated: Secondary | ICD-10-CM | POA: Diagnosis not present

## 2019-10-01 DIAGNOSIS — F17210 Nicotine dependence, cigarettes, uncomplicated: Secondary | ICD-10-CM | POA: Insufficient documentation

## 2019-10-01 DIAGNOSIS — Z79899 Other long term (current) drug therapy: Secondary | ICD-10-CM | POA: Diagnosis not present

## 2019-10-01 DIAGNOSIS — M25511 Pain in right shoulder: Secondary | ICD-10-CM | POA: Diagnosis not present

## 2019-10-01 DIAGNOSIS — Y999 Unspecified external cause status: Secondary | ICD-10-CM | POA: Diagnosis not present

## 2019-10-01 DIAGNOSIS — I2510 Atherosclerotic heart disease of native coronary artery without angina pectoris: Secondary | ICD-10-CM | POA: Diagnosis not present

## 2019-10-01 DIAGNOSIS — S20211A Contusion of right front wall of thorax, initial encounter: Secondary | ICD-10-CM | POA: Diagnosis not present

## 2019-10-01 MED ORDER — DICLOFENAC SODIUM 1 % EX GEL: 2.0000 g | Freq: Four times a day (QID) | CUTANEOUS | 0 refills | Status: DC

## 2019-10-01 MED ORDER — LIDOCAINE 5 % EX PTCH
1.0000 | MEDICATED_PATCH | CUTANEOUS | Status: DC
  Administered 2019-10-01: 1 via TRANSDERMAL
  Filled 2019-10-01: qty 1

## 2019-10-01 MED ORDER — ACETAMINOPHEN 500 MG PO TABS
1000.0000 mg | ORAL_TABLET | Freq: Once | ORAL | Status: AC
  Administered 2019-10-01: 1000 mg via ORAL
  Filled 2019-10-01: qty 2

## 2019-10-01 MED ORDER — LIDOCAINE 5 % EX PTCH: 1.0000 | MEDICATED_PATCH | CUTANEOUS | 0 refills | Status: DC

## 2019-10-01 MED ORDER — KETOROLAC TROMETHAMINE 15 MG/ML IJ SOLN
15.0000 mg | Freq: Once | INTRAMUSCULAR | Status: AC
  Administered 2019-10-01: 05:00:00 15 mg via INTRAVENOUS
  Filled 2019-10-01: qty 1
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
## 2019-10-01 NOTE — ED Notes (Signed)
Pt pacing the hall, and unable to sit still.
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
## 2019-10-03 ENCOUNTER — Other Ambulatory Visit: Payer: Self-pay

## 2019-10-03 ENCOUNTER — Emergency Department (HOSPITAL_COMMUNITY)
Admission: EM | Admit: 2019-10-03 | Discharge: 2019-10-03 | Disposition: A | Discharge status: Home / Self Care | Payer: Medicaid Other | Attending: Emergency Medicine | Admitting: Emergency Medicine

## 2019-10-03 ENCOUNTER — Encounter (HOSPITAL_COMMUNITY): Payer: Self-pay | Admitting: *Deleted

## 2019-10-03 DIAGNOSIS — R109 Unspecified abdominal pain: Secondary | ICD-10-CM | POA: Insufficient documentation

## 2019-10-03 DIAGNOSIS — Z5321 Procedure and treatment not carried out due to patient leaving prior to being seen by health care provider: Secondary | ICD-10-CM | POA: Diagnosis not present

## 2019-10-03 LAB — COMPREHENSIVE METABOLIC PANEL
ALT: 23 U/L (ref 0–44)
AST: 36 U/L (ref 15–41)
Albumin: 4.1 g/dL (ref 3.5–5.0)
Alkaline Phosphatase: 77 U/L (ref 38–126)
Anion gap: 11 (ref 5–15)
BUN: 8 mg/dL (ref 6–20)
CO2: 22 mmol/L (ref 22–32)
Calcium: 8.9 mg/dL (ref 8.9–10.3)
Chloride: 107 mmol/L (ref 98–111)
Creatinine, Ser: 1 mg/dL (ref 0.61–1.24)
GFR calc Af Amer: >60 mL/min (ref >60)
GFR calc non Af Amer: >60 mL/min (ref >60)
Glucose, Bld: 92 mg/dL (ref 70–99)
Potassium: 3.9 mmol/L (ref 3.5–5.1)
Sodium: 140 mmol/L (ref 135–145)
Total Bilirubin: 0.6 mg/dL (ref 0.3–1.2)
Total Protein: 7.4 g/dL (ref 6.5–8.1)

## 2019-10-03 LAB — CBC
HCT: 40.3 % (ref 39.0–52.0)
Hemoglobin: 13.9 g/dL (ref 13.0–17.0)
MCH: 31.4 pg (ref 26.0–34.0)
MCHC: 34.5 g/dL (ref 30.0–36.0)
MCV: 91.2 fL (ref 80.0–100.0)
Platelets: 244 10*3/uL (ref 150–400)
RBC: 4.42 MIL/uL (ref 4.22–5.81)
RDW: 12.6 % (ref 11.5–15.5)
WBC: 6.9 10*3/uL (ref 4.0–10.5)
nRBC: 0 % (ref 0.0–0.2)

## 2019-10-03 LAB — LIPASE, BLOOD: Lipase: 34 U/L (ref 11–51)

## 2019-10-03 MED ORDER — SODIUM CHLORIDE 0.9% FLUSH: 3.0000 mL | Freq: Once | INTRAVENOUS | Status: DC

## 2019-10-03 NOTE — ED Triage Notes (Signed)
Pt arrives via EMS with c/o upper abdominal pain, problems with his ulcer, says it has been getting worse.

## 2020-04-11 ENCOUNTER — Emergency Department (HOSPITAL_COMMUNITY): Payer: Medicaid Other

## 2020-04-11 ENCOUNTER — Emergency Department (HOSPITAL_COMMUNITY)
Admission: EM | Admit: 2020-04-11 | Discharge: 2020-04-12 | Disposition: A | Discharge status: Home / Self Care | Payer: Medicaid Other | Attending: Emergency Medicine | Admitting: Emergency Medicine

## 2020-04-11 ENCOUNTER — Other Ambulatory Visit: Payer: Self-pay

## 2020-04-11 DIAGNOSIS — K644 Residual hemorrhoidal skin tags: Secondary | ICD-10-CM

## 2020-04-11 DIAGNOSIS — R1010 Upper abdominal pain, unspecified: Secondary | ICD-10-CM | POA: Diagnosis not present

## 2020-04-11 DIAGNOSIS — R079 Chest pain, unspecified: Secondary | ICD-10-CM

## 2020-04-11 DIAGNOSIS — I2510 Atherosclerotic heart disease of native coronary artery without angina pectoris: Secondary | ICD-10-CM | POA: Diagnosis not present

## 2020-04-11 DIAGNOSIS — R12 Heartburn: Secondary | ICD-10-CM

## 2020-04-11 DIAGNOSIS — F17210 Nicotine dependence, cigarettes, uncomplicated: Secondary | ICD-10-CM | POA: Insufficient documentation

## 2020-04-11 DIAGNOSIS — K219 Gastro-esophageal reflux disease without esophagitis: Secondary | ICD-10-CM | POA: Insufficient documentation

## 2020-04-11 LAB — URINALYSIS, ROUTINE W REFLEX MICROSCOPIC
Bilirubin Urine: NEGATIVE
Glucose, UA: NEGATIVE mg/dL
Hgb urine dipstick: NEGATIVE
Ketones, ur: NEGATIVE mg/dL
Leukocytes,Ua: NEGATIVE
Nitrite: NEGATIVE
Protein, ur: NEGATIVE mg/dL
Specific Gravity, Urine: 1.013 (ref 1.005–1.030)
pH: 6 (ref 5.0–8.0)

## 2020-04-11 LAB — CBC WITH DIFFERENTIAL/PLATELET
Abs Immature Granulocytes: 0.06 10*3/uL (ref 0.00–0.07)
Basophils Absolute: 0 10*3/uL (ref 0.0–0.1)
Basophils Relative: 0 %
Eosinophils Absolute: 0.1 10*3/uL (ref 0.0–0.5)
Eosinophils Relative: 0 %
HCT: 39.9 % (ref 39.0–52.0)
Hemoglobin: 13.5 g/dL (ref 13.0–17.0)
Immature Granulocytes: 0 %
Lymphocytes Relative: 11 %
Lymphs Abs: 1.5 10*3/uL (ref 0.7–4.0)
MCH: 31 pg (ref 26.0–34.0)
MCHC: 33.8 g/dL (ref 30.0–36.0)
MCV: 91.7 fL (ref 80.0–100.0)
Monocytes Absolute: 1.5 10*3/uL — ABNORMAL HIGH (ref 0.1–1.0)
Monocytes Relative: 10 %
Neutro Abs: 10.9 10*3/uL — ABNORMAL HIGH (ref 1.7–7.7)
Neutrophils Relative %: 79 %
Platelets: 266 10*3/uL (ref 150–400)
RBC: 4.35 MIL/uL (ref 4.22–5.81)
RDW: 12.9 % (ref 11.5–15.5)
WBC: 14.1 10*3/uL — ABNORMAL HIGH (ref 4.0–10.5)
nRBC: 0 % (ref 0.0–0.2)

## 2020-04-11 LAB — COMPREHENSIVE METABOLIC PANEL
ALT: 11 U/L (ref 0–44)
AST: 14 U/L — ABNORMAL LOW (ref 15–41)
Albumin: 3.6 g/dL (ref 3.5–5.0)
Alkaline Phosphatase: 59 U/L (ref 38–126)
Anion gap: 8 (ref 5–15)
BUN: 9 mg/dL (ref 6–20)
CO2: 25 mmol/L (ref 22–32)
Calcium: 9.5 mg/dL (ref 8.9–10.3)
Chloride: 103 mmol/L (ref 98–111)
Creatinine, Ser: 1.17 mg/dL (ref 0.61–1.24)
GFR, Estimated: >60 mL/min (ref >60)
Glucose, Bld: 106 mg/dL — ABNORMAL HIGH (ref 70–99)
Potassium: 4.5 mmol/L (ref 3.5–5.1)
Sodium: 136 mmol/L (ref 135–145)
Total Bilirubin: 0.4 mg/dL (ref 0.3–1.2)
Total Protein: 7 g/dL (ref 6.5–8.1)

## 2020-04-11 LAB — TROPONIN I (HIGH SENSITIVITY)
Troponin I (High Sensitivity): 3 ng/L (ref <18)
Troponin I (High Sensitivity): 4 ng/L (ref <18)

## 2020-04-11 LAB — POC OCCULT BLOOD, ED: Fecal Occult Bld: POSITIVE — AB

## 2020-04-11 LAB — LIPASE, BLOOD: Lipase: 39 U/L (ref 11–51)

## 2020-04-11 MED ORDER — HYDROCORTISONE (PERIANAL) 2.5 % EX CREA: 1.0000 "application " | TOPICAL_CREAM | Freq: Two times a day (BID) | CUTANEOUS | 0 refills | Status: DC

## 2020-04-11 MED ORDER — MORPHINE SULFATE (PF) 4 MG/ML IV SOLN
4.0000 mg | Freq: Once | INTRAVENOUS | Status: AC
  Administered 2020-04-11: 4 mg via INTRAVENOUS
  Filled 2020-04-11: qty 1

## 2020-04-11 MED ORDER — HYDROCODONE-ACETAMINOPHEN 5-325 MG PO TABS
1.0000 | ORAL_TABLET | ORAL | 0 refills | Status: DC | PRN
Start: 2020-04-11 — End: 2020-05-13

## 2020-04-11 MED ORDER — OMEPRAZOLE 20 MG PO CPDR: 20.0000 mg | DELAYED_RELEASE_CAPSULE | Freq: Every day | ORAL | 0 refills | Status: DC

## 2020-04-11 MED ORDER — PANTOPRAZOLE SODIUM 40 MG PO TBEC
40.0000 mg | DELAYED_RELEASE_TABLET | Freq: Every day | ORAL | Status: DC
  Administered 2020-04-12: 40 mg via ORAL
  Filled 2020-04-11: qty 1

## 2020-04-11 MED ORDER — SUCRALFATE 1 G PO TABS
1.0000 g | ORAL_TABLET | Freq: Once | ORAL | Status: AC
  Administered 2020-04-12: 1 g via ORAL
  Filled 2020-04-11: qty 1

## 2020-04-11 MED ORDER — LIDOCAINE VISCOUS HCL 2 % MT SOLN
15.0000 mL | Freq: Once | OROMUCOSAL | Status: AC
  Administered 2020-04-11: 15 mL via ORAL
  Filled 2020-04-11: qty 15

## 2020-04-11 MED ORDER — ALUM & MAG HYDROXIDE-SIMETH 200-200-20 MG/5ML PO SUSP
30.0000 mL | Freq: Once | ORAL | Status: AC
  Administered 2020-04-11: 30 mL via ORAL
  Filled 2020-04-11: qty 30

## 2020-04-11 MED ORDER — SUCRALFATE 1 G PO TABS: 1.0000 g | ORAL_TABLET | Freq: Three times a day (TID) | ORAL | 0 refills | Status: DC

## 2020-04-11 NOTE — Discharge Instructions (Signed)
Your history, exam, work-up today likely fit with your rectal hemorrhoids causing pain and prompting you to take the nostril anti-inflammatory medication which irritated your known reflux and ulcers.  This led to further abdominal pain in the dark stools that have some blood in them.  Your hemoglobin was normal, do not feel you need blood transfusion.  Your symptoms improved with the medications on your GI tract.  We did not see evidence of infection or abscess on your rectal exam however given your hemorrhoidal troubles, please use the cream and follow-up with general surgery for further management.  Please follow-up with gastroenterology for your ulcer and reflux pain.  Please take the Prilosec and Carafate for this.  You may also use the pain medicine to help if it is severe.  If any symptoms change or worsen, please return to nearest emergency department.  Please rest and stay hydrated.

## 2020-04-11 NOTE — ED Triage Notes (Signed)
Pt arrives to ED with c/o of sore near rectum times 1 month, he also is concerned about his urine being dark.

## 2020-04-11 NOTE — ED Notes (Signed)
Pt outside taking a walk

## 2020-04-11 NOTE — ED Provider Notes (Signed)
[facility] EMERGENCY DEPARTMENT Provider Note   CSN: [id number] Arrival date & time: 04/11/20  1356     History Chief Complaint  Patient presents with  . Wound Check    ARIYON MITTLEMAN is a 54 y.o. male.  The history is provided by the patient and medical records. No language interpreter was used.  Chest Pain Pain location:  Epigastric and substernal area Pain quality: aching and burning   Pain radiates to:  Does not radiate Pain severity:  Severe Onset quality:  Gradual Duration:  1 day Timing:  Constant Progression:  Waxing and waning Chronicity:  Recurrent Context comment:  Nsaid use Relieved by:  Nothing Worsened by:  Nothing Ineffective treatments: NSAIDS. Associated symptoms: abdominal pain and heartburn   Associated symptoms: no altered mental status, no anorexia, no anxiety, no back pain, no cough, no diaphoresis, no fatigue, no fever, no headache, no lower extremity edema, no nausea, no near-syncope, no numbness, no palpitations, no shortness of breath, no vomiting and no weakness   Risk factors: male sex        Past Medical History:  Diagnosis Date  . Abnormal ECG    a. early repolarization  . Arthritis    "my whole left side" (05/03/2014)  . Bipolar disorder ([place])   . Bradycardia    a. asymptomatic  . CAD in native artery    a. Nonobstructive cath 11/2007;  b. Presented with ST elevation - Nonobstructive cath 08/2011  . Chest pain, mid sternal   . Coronary artery disease   . GERD (gastroesophageal reflux disease)   . History of cocaine abuse ([place])    a. quit ? 2009  . History of ETOH abuse    a. drinks 2 "40's" / wk  . Marijuana abuse    a. uses ~ 1x /wk or less  . Pneumonia   . Stomach ulcer   . Syncope    a. 12/2010 - presumed to be vasovagal  . Tobacco abuse     Patient Active Problem List   Diagnosis Date Noted  . MDD (major depressive disorder), recurrent episode, severe ([place]) 03/24/2019  . MDD (major depressive  disorder), recurrent severe, without psychosis ([place]) 02/08/2018  . Major depressive disorder, recurrent episode, severe with anxious distress ([place]) 05/23/2016  . PTSD (post-traumatic stress disorder) 05/23/2016  . Cocaine use disorder, moderate, in early remission ([place]) 05/23/2016  . Alcohol use disorder, moderate, dependence ([place]) 05/23/2016  . Affective psychosis, bipolar ([place]) 04/30/2016  . Cocaine abuse with cocaine-induced mood disorder ([place]) 03/04/2016  . Alcohol dependence with uncomplicated withdrawal ([place])   . Alcohol use disorder, severe, dependence ([place]) 01/18/2015  . Alcohol dependence with alcohol-induced mood disorder ([place])   . Suicidal ideation   . Acute pancreatitis 03/20/2012  . Polysubstance abuse ([place]) 03/20/2012  . Cannabis abuse 01/12/2012  . Alcohol dependence ([place]) 10/15/2011  . Abnormal ECG   . Chest pain   . CAD in native artery   . GERD (gastroesophageal reflux disease)   . Tobacco abuse   . History of cocaine abuse ([place])   . History of ETOH abuse   . Bradycardia   . Syncope     Past Surgical History:  Procedure Laterality Date  . CARDIAC CATHETERIZATION  2009; 08/2011   [name] 08/06/2011  . LEFT HEART CATHETERIZATION WITH CORONARY ANGIOGRAM N/A 08/16/2011   Procedure: LEFT HEART CATHETERIZATION WITH CORONARY ANGIOGRAM;  Surgeon: [name], MD;  Location: [facility] CATH LAB;  Service: Cardiovascular;  Laterality: N/A;  No family history on file.  Social History   Tobacco Use  . Smoking status: Current Some Day Smoker    Packs/day: 0.50    Years: 20.00    Pack years: 10.00    Types: Cigarettes    Last attempt to quit: 06/24/2015    Years since quitting: 4.8  . Smokeless tobacco: Never Used  Vaping Use  . Vaping Use: Never used  Substance Use Topics  . Alcohol use: Yes    Alcohol/week: 48.0 standard drinks    Types: 48 Cans of beer per week    Comment: 2 40 oz beers per day  . Drug use: Yes    Types: Marijuana, Cocaine    Home  Medications Prior to Admission medications   Medication Sig Start Date End Date Taking? Authorizing Provider  diclofenac Sodium (VOLTAREN) 1 % GEL Apply 2 g topically 4 (four) times daily. 10/01/19   Palumbo, April, MD  escitalopram (LEXAPRO) 10 MG tablet Take 1 tablet (10 mg total) by mouth daily. Patient not taking: Reported on 10/01/2019 07/23/19   Connye Burkitt, NP  gabapentin (NEURONTIN) 100 MG capsule Take 2 capsules (200 mg total) by mouth 3 (three) times daily. Patient not taking: Reported on 10/01/2019 07/23/19   Connye Burkitt, NP  hydrOXYzine (ATARAX/VISTARIL) 25 MG tablet Take 1 tablet (25 mg total) by mouth every 6 (six) hours as needed for anxiety. Patient not taking: Reported on 10/01/2019 07/23/19   Connye Burkitt, NP  lidocaine (LIDODERM) 5 % Place 1 patch onto the skin daily. Remove & Discard patch within 12 hours or as directed by MD 10/01/19   Randal Buba, April, MD  pantoprazole (PROTONIX) 40 MG tablet Take 1 tablet (40 mg total) by mouth daily. Patient not taking: Reported on 10/01/2019 07/23/19   Connye Burkitt, NP  sucralfate (CARAFATE) 1 g tablet Take 1 tablet (1 g total) by mouth 4 (four) times daily -  with meals and at bedtime. Patient not taking: Reported on 10/01/2019 07/23/19   Connye Burkitt, NP  traZODone (DESYREL) 50 MG tablet Take 1 tablet (50 mg total) by mouth at bedtime as needed for sleep. Patient not taking: Reported on 10/01/2019 07/23/19   Connye Burkitt, NP    Allergies    Aspirin, Pepperoni [pickled meat], and Tomato  Review of Systems   Review of Systems  Constitutional: Negative for chills, diaphoresis, fatigue and fever.  HENT: Negative for congestion.   Eyes: Negative for visual disturbance.  Respiratory: Negative for cough, chest tightness, shortness of breath and wheezing.   Cardiovascular: Positive for chest pain. Negative for palpitations and near-syncope.  Gastrointestinal: Positive for abdominal pain, heartburn and rectal pain. Negative for anorexia,  constipation, diarrhea, nausea and vomiting.  Genitourinary: Negative for decreased urine volume, discharge, dysuria, flank pain, penile pain, penile swelling and scrotal swelling.       Darker urine   Musculoskeletal: Negative for back pain, neck pain and neck stiffness.  Neurological: Negative for weakness, light-headedness, numbness and headaches.  Psychiatric/Behavioral: Negative for agitation and confusion.  All other systems reviewed and are negative.   Physical Exam Updated Vital Signs BP 104/76   Pulse 62   Temp 97.8 F (36.6 C) (Oral)   Resp 18   SpO2 98%   Physical Exam Vitals and nursing note reviewed.  Constitutional:      General: He is not in acute distress.    Appearance: He is well-developed. He is not ill-appearing, toxic-appearing or diaphoretic.  HENT:  Head: Normocephalic and atraumatic.     Nose: No congestion or rhinorrhea.     Mouth/Throat:     Mouth: Mucous membranes are moist.     Pharynx: No oropharyngeal exudate or posterior oropharyngeal erythema.  Eyes:     Conjunctiva/sclera: Conjunctivae normal.  Cardiovascular:     Rate and Rhythm: Normal rate and regular rhythm.     Pulses: Normal pulses.     Heart sounds: No murmur heard.   Pulmonary:     Effort: Pulmonary effort is normal. No respiratory distress.     Breath sounds: Normal breath sounds. No wheezing, rhonchi or rales.  Chest:     Chest wall: Tenderness present.  Abdominal:     General: Abdomen is flat.     Palpations: Abdomen is soft.     Tenderness: There is abdominal tenderness. There is no right CVA tenderness, left CVA tenderness, guarding or rebound.  Genitourinary:    Rectum: Guaiac result positive. Tenderness and external hemorrhoid present.     Comments: Nonthrombosed external hemorrhoid with some tenderness.  No erythema, fluctuance, crepitance, or evidence of cellulitis or abscess seen on initial exam. Musculoskeletal:        General: No tenderness.     Cervical back:  Neck supple. No tenderness.     Right lower leg: No edema.     Left lower leg: No edema.  Skin:    General: Skin is warm and dry.     Capillary Refill: Capillary refill takes less than 2 seconds.     Findings: No rash.  Neurological:     General: No focal deficit present.     Mental Status: He is alert.     Sensory: No sensory deficit.     Motor: No weakness.  Psychiatric:        Mood and Affect: Mood normal.     ED Results / Procedures / Treatments   Labs (all labs ordered are listed, but only abnormal results are displayed) Labs Reviewed  COMPREHENSIVE METABOLIC PANEL - Abnormal; Notable for the following components:      Result Value   Glucose, Bld 106 (*)    AST 14 (*)    All other components within normal limits  CBC WITH DIFFERENTIAL/PLATELET - Abnormal; Notable for the following components:   WBC 14.1 (*)    Neutro Abs 10.9 (*)    Monocytes Absolute 1.5 (*)    All other components within normal limits  POC OCCULT BLOOD, ED - Abnormal; Notable for the following components:   Fecal Occult Bld POSITIVE (*)    All other components within normal limits  URINALYSIS, ROUTINE W REFLEX MICROSCOPIC  LIPASE, BLOOD  TROPONIN I (HIGH SENSITIVITY)  TROPONIN I (HIGH SENSITIVITY)    EKG EKG Interpretation  Date/Time:  Friday April 11 2020 19:40:26 EDT Ventricular Rate:  51 PR Interval:    QRS Duration: 88 QT Interval:  390 QTC Calculation: 360 R Axis:   31 Text Interpretation: Sinus rhythm ST elevation, consider anterior injury when compared to prior, similar apperance, less U wave. No STEMI Confirmed by Antony Blackbird [phone]) on 04/11/2020 8:05:46 PM   Radiology DG Chest Portable 1 View  Result Date: 04/11/2020 CLINICAL DATA:  Chest pain. EXAM: PORTABLE CHEST 1 VIEW COMPARISON:  Chest x-ray dated October 01, 2019. FINDINGS: The heart size and mediastinal contours are within normal limits. Both lungs are clear. Azygos lobe again noted. The visualized skeletal structures  are unremarkable. IMPRESSION: No active disease. Electronically Signed   By:  Titus Dubin M.D.   On: 04/11/2020 20:20   US Abdomen Limited RUQ (LIVER/GB)  Result Date: 04/11/2020 CLINICAL DATA:  Upper abdominal pain. EXAM: ULTRASOUND ABDOMEN LIMITED RIGHT UPPER QUADRANT COMPARISON:  Abdominal CT 10/16/2014 FINDINGS: Gallbladder: Partially distended. No gallstones or wall thickening visualized. There is a 2 mm polyp. No sonographic Murphy sign noted by sonographer. Common bile duct: Diameter: 4 mm. Liver: Within normal limits in parenchymal echogenicity. The hemangioma in the right lobe on prior CT is not well seen by ultrasound. Portal vein is patent on color Doppler imaging with normal direction of blood flow towards the liver. Other: No right upper quadrant ascites. IMPRESSION: 1. No gallstones or sonographic findings of cholecystitis. 2. Tiny 2 mm gallbladder polyp. This is almost certainly benign, and no dedicated follow-up is recommended. 3. Known hemangioma in the right lobe of the liver on prior CT is not well seen by ultrasound. Electronically Signed   By: Keith Rake M.D.   On: 04/11/2020 20:56    Procedures Procedures (including critical care time)  Medications Ordered in ED Medications  pantoprazole (PROTONIX) EC tablet 40 mg (has no administration in time range)  sucralfate (CARAFATE) tablet 1 g (has no administration in time range)  alum & mag hydroxide-simeth (MAALOX/MYLANTA) 200-200-20 MG/5ML suspension 30 mL (30 mLs Oral Given 04/11/20 2000)    And  lidocaine (XYLOCAINE) 2 % viscous mouth solution 15 mL (15 mLs Oral Given 04/11/20 1959)  morphine 4 MG/ML injection 4 mg (4 mg Intravenous Given 04/11/20 2001)    ED Course  I have reviewed the triage vital signs and the nursing notes.  Pertinent labs & imaging results that were available during my care of the patient were reviewed by me and considered in my medical decision making (see chart for details).    MDM  Rules/Calculators/A&P                          AHMAD VANWEY is a 54 y.o. male with a past medical history significant for CAD, GERD with ulcers, prior pancreatitis, polysubstance abuse, and reported chronic rectal pain who presents with upper abdominal pain and chest pain after taking NSAIDs for chronic rectal pain. He reports that for the last few months, he has had on and off rectal pain and took ibuprofen the other day. He thinks this aggravated a stomach ulcer and he is having significant amount of pain in his chest and upper abdomen that feels like reflux. He does report he had some heart problems in the past and would like his heart " checked out". He reports it is a burning pain going from the epigastrium up towards his chest. He is denying shortness of breath, fevers, or chills. He denies nausea or vomiting. No hematemesis reported. He does report his stools have been darker today. He reports he still passing gas. He reports that he still has rectal pain and does have history of hemorrhoids reportedly. He also thinks his urine is darker.  On exam with a chaperone, lungs are clear and chest is nontender. Abdomen is nontender. Epigastrium is slightly tender to palpation. No murmur. Good pulses in extremities. Rectal exam did not reveal dark stools and occult card was sent. Patient did have tenderness on a hemorrhoid that did not appear thrombosed. I did not palpate perianal or perirectal abscess on my exam. There is no skin changes or erythema seen. No crepitance. No evidence of cellulitis seen. Exam otherwise unremarkable. Patient  is very uncomfortable with the burning chest discomfort.  Clinically I do suspect that the patient's chronic pain from his hemorrhoids prompted him to take an NSAID which exacerbated significantly his known ulcers and reflux. We will give him a GI cocktail and some pain medicine. We will get screening labs to make sure this is not a typical presentation of cardiac disease  given his history of CAD in the chart. We will also get a right upper quadrant ultrasound given the epigastric tenderness on my exam. Bowel sounds were appreciated however. Doubt bowel obstruction.  If work-up is reassuring and he is feeling better, dissipate discharge with prescription for Anusol for the hemorrhoid pain and follow-up with outpatient general surgery for continued hemorrhoid struggles.  Anticipate reassessment after work-up.  After GI cocktail, patient reports feeling much better.  His burning in his chest is nearly resolved.  His troponin was negative x2.  His fecal occult test was positive making I suspect that he did irritate his stomach ulcers with the NSAID.  No pancreatitis.  Liver function not elevated.  Creatinine reassuring.  Ultrasound not show acute cholecystitis and showed a small gallbladder polyp which I informed the patient of.  Chest x-ray reassuring.  Had a shared decision made conversation with patient and we agreed to give him prescription for Prilosec and Carafate which she is out of, prescription for Anusol for his hemorrhoid pain and general surgery follow-up, instructions to follow-up with his gastroenterologist, pain medicine, and good return precautions.  Patient agrees with plan of care and after p.o. challenge will be discharged for outpatient follow-up.   Final Clinical Impression(s) / ED Diagnoses Final diagnoses:  Upper abdominal pain  Heartburn  Chest pain, unspecified type  External hemorrhoid    Rx / DC Orders ED Discharge Orders         Ordered    hydrocortisone (ANUSOL-HC) 2.5 % rectal cream  2 times daily        04/11/20 2341    HYDROcodone-acetaminophen (NORCO/VICODIN) 5-325 MG tablet  Every 4 hours PRN        04/11/20 2341    omeprazole (PRILOSEC) 20 MG capsule  Daily        04/11/20 2341    sucralfate (CARAFATE) 1 g tablet  3 times daily with meals & bedtime        04/11/20 2341          Clinical Impression: 1. Heartburn   2.  Upper abdominal pain   3. Chest pain, unspecified type   4. External hemorrhoid     Disposition: Discharge  Condition: Good  I have discussed the results, Dx and Tx plan with the pt(& family if present). He/she/they expressed understanding and agree(s) with the plan. Discharge instructions discussed at great length. Strict return precautions discussed and pt &/or family have verbalized understanding of the instructions. No further questions at time of discharge.    New Prescriptions   HYDROCODONE-ACETAMINOPHEN (NORCO/VICODIN) 5-325 MG TABLET    Take 1 tablet by mouth every 4 (four) hours as needed.   HYDROCORTISONE (ANUSOL-HC) 2.5 % RECTAL CREAM    Place 1 application rectally 2 (two) times daily.   OMEPRAZOLE (PRILOSEC) 20 MG CAPSULE    Take 1 capsule (20 mg total) by mouth daily.   SUCRALFATE (CARAFATE) 1 G TABLET    Take 1 tablet (1 g total) by mouth 4 (four) times daily -  with meals and at bedtime.    Follow Up: [name], MD [address] [place] [address] [phone]     [facility] [place] [id number] [place] EMERGENCY DEPARTMENT [address] [id number] mc [place] [phone]    Surgery, [place] [address] [name] [address] [phone]        Jourdain Guay, Gwenyth Allegra, MD 04/11/20 [phone]

## 2020-05-13 ENCOUNTER — Encounter (HOSPITAL_COMMUNITY): Payer: Self-pay

## 2020-05-13 ENCOUNTER — Other Ambulatory Visit: Payer: Self-pay

## 2020-05-13 ENCOUNTER — Ambulatory Visit (HOSPITAL_COMMUNITY)
Admission: EM | Admit: 2020-05-13 | Discharge: 2020-05-14 | Disposition: A | Discharge status: Home / Self Care | Payer: Medicaid Other | Attending: Registered Nurse | Admitting: Registered Nurse

## 2020-05-13 DIAGNOSIS — Z560 Unemployment, unspecified: Secondary | ICD-10-CM | POA: Insufficient documentation

## 2020-05-13 DIAGNOSIS — F10230 Alcohol dependence with withdrawal, uncomplicated: Secondary | ICD-10-CM | POA: Diagnosis present

## 2020-05-13 DIAGNOSIS — R45851 Suicidal ideations: Secondary | ICD-10-CM | POA: Diagnosis not present

## 2020-05-13 DIAGNOSIS — Z20822 Contact with and (suspected) exposure to covid-19: Secondary | ICD-10-CM | POA: Insufficient documentation

## 2020-05-13 DIAGNOSIS — Z6379 Other stressful life events affecting family and household: Secondary | ICD-10-CM | POA: Insufficient documentation

## 2020-05-13 DIAGNOSIS — F332 Major depressive disorder, recurrent severe without psychotic features: Secondary | ICD-10-CM | POA: Insufficient documentation

## 2020-05-13 DIAGNOSIS — Z720 Tobacco use: Secondary | ICD-10-CM | POA: Insufficient documentation

## 2020-05-13 DIAGNOSIS — R45850 Homicidal ideations: Secondary | ICD-10-CM | POA: Insufficient documentation

## 2020-05-13 DIAGNOSIS — F10239 Alcohol dependence with withdrawal, unspecified: Secondary | ICD-10-CM | POA: Insufficient documentation

## 2020-05-13 DIAGNOSIS — Z5900 Homelessness unspecified: Secondary | ICD-10-CM | POA: Insufficient documentation

## 2020-05-13 DIAGNOSIS — F1910 Other psychoactive substance abuse, uncomplicated: Secondary | ICD-10-CM | POA: Diagnosis present

## 2020-05-13 DIAGNOSIS — F4310 Post-traumatic stress disorder, unspecified: Secondary | ICD-10-CM

## 2020-05-13 LAB — TSH: TSH: 1.356 u[IU]/mL (ref 0.350–4.500)

## 2020-05-13 LAB — COMPREHENSIVE METABOLIC PANEL
ALT: 15 U/L (ref 0–44)
AST: 16 U/L (ref 15–41)
Albumin: 4 g/dL (ref 3.5–5.0)
Alkaline Phosphatase: 65 U/L (ref 38–126)
Anion gap: 10 (ref 5–15)
BUN: 9 mg/dL (ref 6–20)
CO2: 29 mmol/L (ref 22–32)
Calcium: 8.8 mg/dL — ABNORMAL LOW (ref 8.9–10.3)
Chloride: 102 mmol/L (ref 98–111)
Creatinine, Ser: 0.98 mg/dL (ref 0.61–1.24)
GFR, Estimated: >60 mL/min (ref >60)
Glucose, Bld: 80 mg/dL (ref 70–99)
Potassium: 3.5 mmol/L (ref 3.5–5.1)
Sodium: 141 mmol/L (ref 135–145)
Total Bilirubin: 0.4 mg/dL (ref 0.3–1.2)
Total Protein: 7.2 g/dL (ref 6.5–8.1)

## 2020-05-13 LAB — LIPID PANEL
Cholesterol: 160 mg/dL (ref 0–200)
HDL: 67 mg/dL (ref >40)
LDL Cholesterol: 56 mg/dL (ref 0–99)
Total CHOL/HDL Ratio: 2.4 RATIO
Triglycerides: 184 mg/dL — ABNORMAL HIGH (ref <150)
VLDL: 37 mg/dL (ref 0–40)

## 2020-05-13 LAB — CBC WITH DIFFERENTIAL/PLATELET
Abs Immature Granulocytes: 0.01 10*3/uL (ref 0.00–0.07)
Basophils Absolute: 0 10*3/uL (ref 0.0–0.1)
Basophils Relative: 1 %
Eosinophils Absolute: 0.1 10*3/uL (ref 0.0–0.5)
Eosinophils Relative: 2 %
HCT: 41.4 % (ref 39.0–52.0)
Hemoglobin: 13.9 g/dL (ref 13.0–17.0)
Immature Granulocytes: 0 %
Lymphocytes Relative: 43 %
Lymphs Abs: 2 10*3/uL (ref 0.7–4.0)
MCH: 30 pg (ref 26.0–34.0)
MCHC: 33.6 g/dL (ref 30.0–36.0)
MCV: 89.4 fL (ref 80.0–100.0)
Monocytes Absolute: 0.5 10*3/uL (ref 0.1–1.0)
Monocytes Relative: 12 %
Neutro Abs: 1.9 10*3/uL (ref 1.7–7.7)
Neutrophils Relative %: 42 %
Platelets: 257 10*3/uL (ref 150–400)
RBC: 4.63 MIL/uL (ref 4.22–5.81)
RDW: 13.2 % (ref 11.5–15.5)
WBC: 4.5 10*3/uL (ref 4.0–10.5)
nRBC: 0 % (ref 0.0–0.2)

## 2020-05-13 LAB — ETHANOL: Alcohol, Ethyl (B): 150 mg/dL — ABNORMAL HIGH (ref <10)

## 2020-05-13 LAB — URINALYSIS, ROUTINE W REFLEX MICROSCOPIC
Bilirubin Urine: NEGATIVE
Glucose, UA: NEGATIVE mg/dL
Hgb urine dipstick: NEGATIVE
Ketones, ur: NEGATIVE mg/dL
Leukocytes,Ua: NEGATIVE
Nitrite: NEGATIVE
Protein, ur: NEGATIVE mg/dL
Specific Gravity, Urine: 1.014 (ref 1.005–1.030)
pH: 7 (ref 5.0–8.0)

## 2020-05-13 LAB — RESP PANEL BY RT-PCR (FLU A&B, COVID) ARPGX2
Influenza A by PCR: NEGATIVE
Influenza B by PCR: NEGATIVE
SARS Coronavirus 2 by RT PCR: NEGATIVE

## 2020-05-13 LAB — POCT URINE DRUG SCREEN - MANUAL ENTRY (I-SCREEN)
POC Amphetamine UR: NOT DETECTED
POC Buprenorphine (BUP): NOT DETECTED
POC Cocaine UR: NOT DETECTED
POC Marijuana UR: POSITIVE — AB
POC Methadone UR: NOT DETECTED
POC Methamphetamine UR: NOT DETECTED
POC Morphine: NOT DETECTED
POC Oxazepam (BZO): NOT DETECTED
POC Oxycodone UR: NOT DETECTED
POC Secobarbital (BAR): NOT DETECTED

## 2020-05-13 LAB — POC SARS CORONAVIRUS 2 AG: SARS Coronavirus 2 Ag: NEGATIVE

## 2020-05-13 LAB — MAGNESIUM: Magnesium: 2.2 mg/dL (ref 1.7–2.4)

## 2020-05-13 LAB — HEMOGLOBIN A1C
Hgb A1c MFr Bld: 4.9 % (ref 4.8–5.6)
Mean Plasma Glucose: 93.93 mg/dL

## 2020-05-13 LAB — GLUCOSE, CAPILLARY: Glucose-Capillary: 92 mg/dL (ref 70–99)

## 2020-05-13 MED ORDER — ESCITALOPRAM OXALATE 10 MG PO TABS
10.0000 mg | ORAL_TABLET | Freq: Once | ORAL | Status: AC
  Administered 2020-05-13: 10 mg via ORAL
  Filled 2020-05-13: qty 1

## 2020-05-13 MED ORDER — GABAPENTIN 300 MG PO CAPS
300.0000 mg | ORAL_CAPSULE | Freq: Three times a day (TID) | ORAL | Status: DC
  Administered 2020-05-13 – 2020-05-14 (×3): 300 mg via ORAL
  Filled 2020-05-13 (×3): qty 1

## 2020-05-13 MED ORDER — HYDROXYZINE HCL 25 MG PO TABS: 25.0000 mg | ORAL_TABLET | Freq: Three times a day (TID) | ORAL | Status: DC | PRN

## 2020-05-13 MED ORDER — TRAZODONE HCL 50 MG PO TABS: 50.0000 mg | ORAL_TABLET | Freq: Every evening | ORAL | Status: DC | PRN

## 2020-05-13 NOTE — ED Notes (Signed)
[facility] [address][id number] 
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
## 2020-05-13 NOTE — Progress Notes (Signed)
CSW was instructed to contact the patient's daughter, Crew Goren [phone]) to "warn" her of the patient's comments of wanting to harm her husband. According to provider and counselor, the patient will not disclose his on-in-law's name.    There was no answer, CSW left HIPAA compliant voicemail requesting a call back.   CSW also attempted to contact [place] [phone] of [place]'s Legal Department to ensure appropriate legal action was taking place. There was no answer at this time. CSW left a voicemail requesting a call back.    Shuvon Rankin, NP aware.     Radonna Ricker, MSW, LCSW Clinical Education officer, museum ([place]) [facility]

## 2020-05-13 NOTE — Progress Notes (Signed)
[name] [name] was compliant with his admission assessment and went to the OBS flex area with a mask. He was oriented to his new environment and offered nourishments.

## 2020-05-13 NOTE — Progress Notes (Signed)
Received a call on my Vocera from [place], MHT she could not  Arouse the patient. This Probation officer asked her to performed a GBS which was 92. Shortly therrafter his VS were 112/80, P-65 and O2 100 %. He was given milk and graham cracker. He denied chest pain at the present time and no acute distress noted. Nilda Simmer, NP was notified and assessed the patient.
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
## 2020-05-13 NOTE — ED Triage Notes (Signed)
Received [name] [name] to the [facility] with GPD, he is here for stress and suicidal ideations without a plan. He initially refused to answer the question related to suicide, then he started to cry al lot. He stated non compliance with his medications for months which consists of Seroquel, Gabapentin and aspirin. He has a history of Bipolar disorder and cardiac,questionable a stent. He has been treated at [facility] in the past.He stated his last drink was yesterday and marijuana.

## 2020-05-13 NOTE — Progress Notes (Signed)
[facility] #[phone]  

## 2020-05-14 MED ORDER — ESCITALOPRAM OXALATE 10 MG PO TABS: 10.0000 mg | ORAL_TABLET | Freq: Every day | ORAL | 0 refills | Status: DC

## 2020-05-14 MED ORDER — ESCITALOPRAM OXALATE 10 MG PO TABS
10.0000 mg | ORAL_TABLET | Freq: Every day | ORAL | Status: DC
  Administered 2020-05-14: 10 mg via ORAL
  Filled 2020-05-14: qty 1

## 2020-05-14 MED ORDER — GABAPENTIN 300 MG PO CAPS: 300.0000 mg | ORAL_CAPSULE | Freq: Three times a day (TID) | ORAL | 0 refills | Status: DC
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
## 2020-05-14 NOTE — Discharge Instructions (Signed)

## 2020-05-14 NOTE — ED Notes (Signed)
Patient continues to rest in bed with eyes closed. Respirations even and non labored. Monitoring continues.

## 2020-05-14 NOTE — ED Notes (Signed)
Patient resting in bed with eyes closed. Respirations even and non labored. No distress noted. Monitoring continues. 

## 2020-05-14 NOTE — ED Notes (Signed)
Pt resting in no acute distress. RR even and unlabored. Safety maintained. 

## 2020-05-14 NOTE — ED Notes (Signed)
Given  breakfast

## 2020-05-14 NOTE — ED Notes (Signed)
Pt sleeping but easily aroused to name being called. Calm, cooperative toward staff. Denies SI/HI. Pt states, "I just have a lot going on right now with my living situation. I'm hoping y'all can find me somewhere to go before I'm discharged". Support given. Informed pt to notify staff with any needs or concerns. Safety maintained.

## 2020-05-14 NOTE — ED Notes (Signed)
Patient is resting in bed with eyes closed. Respirations even and non labored. No distress noted. Monitoring continues.

## 2020-05-14 NOTE — ED Notes (Signed)
Patient A&O x 4, ambulatory. Patient discharged in no acute distress. Patient denied SI/HI, A/VH upon discharge. Patient verbalized understanding of all discharge instructions explained by staff, to include follow up appointments, RX's and safety plan. Pt belongings returned to patient from locker intact. Patient escorted to lobby via staff for transport to [facility] in [place], [name]. Safety maintained.

## 2020-06-04 ENCOUNTER — Telehealth (HOSPITAL_COMMUNITY): Payer: Self-pay | Admitting: Internal Medicine

## 2020-06-04 NOTE — Telephone Encounter (Signed)
Care Management - Follow Up [facility] Discharges   Writer attempted to make contact with patient today and was unsuccessful.  Writer was able to leave a HIPPA compliant voice message and will await callback.   

## 2020-10-14 ENCOUNTER — Emergency Department (HOSPITAL_COMMUNITY): Payer: Medicaid Other

## 2020-10-14 ENCOUNTER — Emergency Department (HOSPITAL_COMMUNITY)
Admission: EM | Admit: 2020-10-14 | Discharge: 2020-10-14 | Disposition: A | Discharge status: Home / Self Care | Payer: Medicaid Other | Attending: Emergency Medicine | Admitting: Emergency Medicine

## 2020-10-14 ENCOUNTER — Other Ambulatory Visit: Payer: Self-pay

## 2020-10-14 DIAGNOSIS — F17210 Nicotine dependence, cigarettes, uncomplicated: Secondary | ICD-10-CM | POA: Diagnosis not present

## 2020-10-14 DIAGNOSIS — R072 Precordial pain: Secondary | ICD-10-CM | POA: Insufficient documentation

## 2020-10-14 DIAGNOSIS — R079 Chest pain, unspecified: Secondary | ICD-10-CM

## 2020-10-14 DIAGNOSIS — I2510 Atherosclerotic heart disease of native coronary artery without angina pectoris: Secondary | ICD-10-CM | POA: Diagnosis not present

## 2020-10-14 LAB — CBC WITH DIFFERENTIAL/PLATELET
Abs Immature Granulocytes: 0.01 10*3/uL (ref 0.00–0.07)
Basophils Absolute: 0 10*3/uL (ref 0.0–0.1)
Basophils Relative: 1 %
Eosinophils Absolute: 0.2 10*3/uL (ref 0.0–0.5)
Eosinophils Relative: 4 %
HCT: 39.7 % (ref 39.0–52.0)
Hemoglobin: 13.5 g/dL (ref 13.0–17.0)
Immature Granulocytes: 0 %
Lymphocytes Relative: 42 %
Lymphs Abs: 1.8 10*3/uL (ref 0.7–4.0)
MCH: 30.2 pg (ref 26.0–34.0)
MCHC: 34 g/dL (ref 30.0–36.0)
MCV: 88.8 fL (ref 80.0–100.0)
Monocytes Absolute: 0.5 10*3/uL (ref 0.1–1.0)
Monocytes Relative: 11 %
Neutro Abs: 1.8 10*3/uL (ref 1.7–7.7)
Neutrophils Relative %: 42 %
Platelets: 287 10*3/uL (ref 150–400)
RBC: 4.47 MIL/uL (ref 4.22–5.81)
RDW: 13.2 % (ref 11.5–15.5)
WBC: 4.3 10*3/uL (ref 4.0–10.5)
nRBC: 0 % (ref 0.0–0.2)

## 2020-10-14 LAB — COMPREHENSIVE METABOLIC PANEL
ALT: 14 U/L (ref 0–44)
AST: 18 U/L (ref 15–41)
Albumin: 3.8 g/dL (ref 3.5–5.0)
Alkaline Phosphatase: 62 U/L (ref 38–126)
Anion gap: 5 (ref 5–15)
BUN: 17 mg/dL (ref 6–20)
CO2: 26 mmol/L (ref 22–32)
Calcium: 9 mg/dL (ref 8.9–10.3)
Chloride: 103 mmol/L (ref 98–111)
Creatinine, Ser: 1.19 mg/dL (ref 0.61–1.24)
GFR, Estimated: >60 mL/min (ref >60)
Glucose, Bld: 95 mg/dL (ref 70–99)
Potassium: 4.4 mmol/L (ref 3.5–5.1)
Sodium: 134 mmol/L — ABNORMAL LOW (ref 135–145)
Total Bilirubin: 0.4 mg/dL (ref 0.3–1.2)
Total Protein: 6.9 g/dL (ref 6.5–8.1)

## 2020-10-14 LAB — TSH: TSH: 1.337 u[IU]/mL (ref 0.350–4.500)

## 2020-10-14 LAB — LACTIC ACID, PLASMA
Lactic Acid, Venous: 0.9 mmol/L (ref 0.5–1.9)
Lactic Acid, Venous: 0.8 mmol/L (ref 0.5–1.9)

## 2020-10-14 LAB — MAGNESIUM: Magnesium: 2 mg/dL (ref 1.7–2.4)

## 2020-10-14 LAB — TROPONIN I (HIGH SENSITIVITY)
Troponin I (High Sensitivity): <2 ng/L (ref <18)
Troponin I (High Sensitivity): <2 ng/L (ref <18)

## 2020-10-14 LAB — LIPASE, BLOOD: Lipase: 60 U/L — ABNORMAL HIGH (ref 11–51)

## 2020-10-14 MED ORDER — SUCRALFATE 1 G PO TABS
1.0000 g | ORAL_TABLET | Freq: Once | ORAL | Status: AC
  Administered 2020-10-14: 1 g via ORAL
  Filled 2020-10-14: qty 1

## 2020-10-14 MED ORDER — LIDOCAINE VISCOUS HCL 2 % MT SOLN
15.0000 mL | Freq: Once | OROMUCOSAL | Status: AC
  Administered 2020-10-14: 15 mL via ORAL
  Filled 2020-10-14: qty 15

## 2020-10-14 MED ORDER — SUCRALFATE 1 G PO TABS: 1.0000 g | ORAL_TABLET | Freq: Three times a day (TID) | ORAL | 0 refills | Status: DC

## 2020-10-14 MED ORDER — ALUM & MAG HYDROXIDE-SIMETH 200-200-20 MG/5ML PO SUSP
30.0000 mL | Freq: Once | ORAL | Status: AC
  Administered 2020-10-14: 30 mL via ORAL
  Filled 2020-10-14: qty 30

## 2020-10-14 MED ORDER — OMEPRAZOLE 20 MG PO CPDR: 20.0000 mg | DELAYED_RELEASE_CAPSULE | Freq: Every day | ORAL | 0 refills | Status: DC

## 2020-10-14 MED ORDER — FENTANYL CITRATE (PF) 100 MCG/2ML IJ SOLN
50.0000 ug | Freq: Once | INTRAMUSCULAR | Status: AC
  Administered 2020-10-14: 50 ug via INTRAVENOUS
  Filled 2020-10-14: qty 2

## 2020-10-14 NOTE — ED Notes (Signed)
Reviewed discharge instructions with patient. Follow-up care and medications reviewed. Patient  verbalized understanding. Patient A&Ox4, VSS, and ambulatory with steady gait upon discharge.  °

## 2020-10-14 NOTE — ED Notes (Signed)
This RN witnessed pt squatting next to bed moaning in pain. Pt reports mid sternal chest pain that radiates to abdomen. MD Tegeler notified.

## 2020-10-14 NOTE — ED Provider Notes (Signed)
[facility] EMERGENCY DEPARTMENT Provider Note   CSN: [id number] Arrival date & time: 10/14/20  6644     History Chief Complaint  Patient presents with  . Chest Pain    [name] [name] is a 55 y.o. male.  The history is provided by the patient and medical records. No language interpreter was used.  Chest Pain Pain location:  Substernal area and epigastric Pain quality: burning   Pain radiates to:  Does not radiate Pain severity:  No pain Onset quality:  Gradual Duration:  2 weeks Timing:  Intermittent Progression:  Waxing and waning Chronicity:  Recurrent Relieved by:  Nothing Worsened by:  Nothing (eating) Ineffective treatments:  None tried Associated symptoms: heartburn and nausea   Associated symptoms: no abdominal pain, no altered mental status, no back pain, no claudication, no cough, no diaphoresis, no fatigue, no fever, no headache, no lower extremity edema, no numbness, no palpitations, no shortness of breath, no vomiting and no weakness        Past Medical History:  Diagnosis Date  . Abnormal ECG    a. early repolarization  . Arthritis    "my whole left side" (05/03/2014)  . Bipolar disorder ([place])   . Bradycardia    a. asymptomatic  . CAD in native artery    a. Nonobstructive cath 11/2007;  b. Presented with ST elevation - Nonobstructive cath 08/2011  . Chest pain, mid sternal   . Coronary artery disease   . GERD (gastroesophageal reflux disease)   . History of cocaine abuse ([place])    a. quit ? 2009  . History of ETOH abuse    a. drinks 2 "40's" / wk  . Marijuana abuse    a. uses ~ 1x /wk or less  . Pneumonia   . Stomach ulcer   . Syncope    a. 12/2010 - presumed to be vasovagal  . Tobacco abuse     Patient Active Problem List   Diagnosis Date Noted  . MDD (major depressive disorder), recurrent episode, severe ([place]) 03/24/2019  . MDD (major depressive disorder), recurrent severe, without psychosis ([place]) 02/08/2018  . Major  depressive disorder, recurrent episode, severe with anxious distress ([place]) 05/23/2016  . PTSD (post-traumatic stress disorder) 05/23/2016  . Cocaine use disorder, moderate, in early remission ([place]) 05/23/2016  . Alcohol use disorder, moderate, dependence ([place]) 05/23/2016  . Affective psychosis, bipolar ([name]) 04/30/2016  . Cocaine abuse with cocaine-induced mood disorder ([place]) 03/04/2016  . Alcohol dependence with uncomplicated withdrawal ([place])   . Alcohol use disorder, severe, dependence ([place]) 01/18/2015  . Alcohol dependence with alcohol-induced mood disorder ([place])   . Suicidal ideation   . Acute pancreatitis 03/20/2012  . Polysubstance abuse ([place]) 03/20/2012  . Cannabis abuse 01/12/2012  . Alcohol dependence ([place]) 10/15/2011  . Abnormal ECG   . Chest pain   . CAD in native artery   . GERD (gastroesophageal reflux disease)   . Tobacco abuse   . History of cocaine abuse ([place])   . History of ETOH abuse   . Bradycardia   . Syncope     Past Surgical History:  Procedure Laterality Date  . CARDIAC CATHETERIZATION  2009; 08/2011   [name] 08/06/2011  . LEFT HEART CATHETERIZATION WITH CORONARY ANGIOGRAM N/A 08/16/2011   Procedure: LEFT HEART CATHETERIZATION WITH CORONARY ANGIOGRAM;  Surgeon: [name], MD;  Location: [facility] CATH LAB;  Service: Cardiovascular;  Laterality: N/A;       No family history on file.  Social  History   Tobacco Use  . Smoking status: Current Some Day Smoker    Packs/day: 0.25    Years: 2.00    Pack years: 0.50    Types: Cigarettes    Last attempt to quit: 06/24/2015    Years since quitting: 5.3  . Smokeless tobacco: Never Used  Vaping Use  . Vaping Use: Never used  Substance Use Topics  . Alcohol use: Yes    Alcohol/week: 48.0 standard drinks    Types: 48 Cans of beer per week    Comment: 2 shot bottles yesterday  . Drug use: Yes    Types: Marijuana, Cocaine    Comment: Crack one year ago and marijuana was yesterday    Home  Medications Prior to Admission medications   Medication Sig Start Date End Date Taking? Authorizing Provider  escitalopram (LEXAPRO) 10 MG tablet Take 1 tablet (10 mg total) by mouth daily. 05/15/20   Money, Lowry Ram, FNP  gabapentin (NEURONTIN) 300 MG capsule Take 1 capsule (300 mg total) by mouth 3 (three) times daily. 05/14/20   Money, Lowry Ram, FNP    Allergies    Aspirin, Pepperoni [pickled meat], and Tomato  Review of Systems   Review of Systems  Constitutional: Negative for chills, diaphoresis, fatigue and fever.  HENT: Negative for congestion.   Eyes: Negative for visual disturbance.  Respiratory: Negative for cough, choking, chest tightness, shortness of breath and wheezing.   Cardiovascular: Positive for chest pain. Negative for palpitations, claudication and leg swelling.  Gastrointestinal: Positive for heartburn and nausea. Negative for abdominal pain, constipation, diarrhea and vomiting.  Genitourinary: Negative for dysuria and flank pain.  Musculoskeletal: Negative for back pain, neck pain and neck stiffness.  Skin: Negative for rash and wound.  Neurological: Negative for weakness, light-headedness, numbness and headaches.  Psychiatric/Behavioral: Negative for agitation and confusion.  All other systems reviewed and are negative.   Physical Exam Updated Vital Signs BP (!) 100/54 (BP Location: Right Arm)   Pulse (!) 49   Temp 98 F (36.7 C) (Oral)   Resp 18   SpO2 100%   Physical Exam Vitals and nursing note reviewed.  Constitutional:      General: He is not in acute distress.    Appearance: He is well-developed. He is not ill-appearing, toxic-appearing or diaphoretic.  HENT:     Head: Normocephalic and atraumatic.  Eyes:     Conjunctiva/sclera: Conjunctivae normal.     Pupils: Pupils are equal, round, and reactive to light.  Cardiovascular:     Rate and Rhythm: Normal rate and regular rhythm.     Heart sounds: No murmur heard.   Pulmonary:     Effort:  Pulmonary effort is normal. No respiratory distress.     Breath sounds: Normal breath sounds. No decreased breath sounds, wheezing, rhonchi or rales.  Chest:     Chest wall: No tenderness.  Abdominal:     Palpations: Abdomen is soft.     Tenderness: There is no abdominal tenderness.  Musculoskeletal:     Cervical back: Neck supple.     Right lower leg: No tenderness. No edema.     Left lower leg: No tenderness. No edema.  Skin:    General: Skin is warm and dry.     Capillary Refill: Capillary refill takes less than 2 seconds.     Findings: No erythema.  Neurological:     General: No focal deficit present.     Mental Status: He is alert.  Psychiatric:  Mood and Affect: Mood normal.     ED Results / Procedures / Treatments   Labs (all labs ordered are listed, but only abnormal results are displayed) Labs Reviewed  COMPREHENSIVE METABOLIC PANEL - Abnormal; Notable for the following components:      Result Value   Sodium 134 (*)    All other components within normal limits  LIPASE, BLOOD - Abnormal; Notable for the following components:   Lipase 60 (*)    All other components within normal limits  CBC WITH DIFFERENTIAL/PLATELET  LACTIC ACID, PLASMA  LACTIC ACID, PLASMA  MAGNESIUM  TSH  TROPONIN I (HIGH SENSITIVITY)  TROPONIN I (HIGH SENSITIVITY)    EKG EKG Interpretation  Date/Time:  Tuesday Oct 14 2020 10:04:18 EDT Ventricular Rate:  49 PR Interval:  187 QRS Duration: 82 QT Interval:  405 QTC Calculation: 366 R Axis:   52 Text Interpretation: Slow sinus arrhythmia RSR' in V1 or V2, probably normal variant ST elevation, consider anterior injury When compared to prior, similar appearance. No STEMI Confirmed by Antony Blackbird [phone]) on 10/14/2020 10:12:20 AM   Radiology DG Chest Portable 1 View  Result Date: 10/14/2020 CLINICAL DATA:  Chest pain EXAM: PORTABLE CHEST 1 VIEW COMPARISON:  04/11/2020 FINDINGS: Heart and mediastinal contours are within normal  limits. No focal opacities or effusions. No acute bony abnormality. IMPRESSION: No active disease. Electronically Signed   By: Rolm Baptise M.D.   On: 10/14/2020 11:20    Procedures Procedures   Medications Ordered in ED Medications  sucralfate (CARAFATE) tablet 1 g (1 g Oral Given 10/14/20 1108)  alum & mag hydroxide-simeth (MAALOX/MYLANTA) 200-200-20 MG/5ML suspension 30 mL (30 mLs Oral Given 10/14/20 1107)    And  lidocaine (XYLOCAINE) 2 % viscous mouth solution 15 mL (15 mLs Oral Given 10/14/20 1107)  fentaNYL (SUBLIMAZE) injection 50 mcg (50 mcg Intravenous Given 10/14/20 1325)    ED Course  I have reviewed the triage vital signs and the nursing notes.  Pertinent labs & imaging results that were available during my care of the patient were reviewed by me and considered in my medical decision making (see chart for details).    MDM Rules/Calculators/A&P                          RAISTLIN GUM is a 55 y.o. male with a past medical history for CAD with MI years ago, GERD, gastric ulcer, PTSD, previous polysubstance abuse, depression, and prior pancreatitis who presents with chest discomfort.  He reports over the last 2 weeks he has had burning type pain in his chest that comes and goes throughout the day.  He reports he has had nausea in the mornings but that has improved.  He reports no shortness of breath diaphoresis, lightheadedness, or syncope.  Denies trauma.  Denies any cough congestion, fevers, or chills.  He is unsure if this feels like previous heart attack as that was over a decade ago.  He reports no nausea or vomiting now.  He reports it is a burning type pain in his central chest.  He thinks it could be his reflux.  On exam, lungs are clear and chest is nontender.  Epigastric area is nontender.  Normal bowel sounds.  Back and flanks nontender.  Good pulses in extremities.  Patient resting but in burning chest discomfort.  EKG shows no STEMI.  Had a shared decision made  conversation with patient and given his reported history, we will get  chest x-ray, labs including troponin, and will monitor him on telemetry.  We will give Carafate and a GI cocktail to see if this helps with what I suspect is a reflux and gastric cause of his pain.  Patient agrees and reports he has been out of his Prilosec for some time, suspect he will need prescription for reflux medication as well.  If work-up is reassuring, anticipate discharge home.   Patient reports feeling better.  Work-up overall reassuring.  Slight elevation in his lipase but given the small elevation do not suspect he has full pancreatitis.  Patient given prescription for Prilosec and Carafate and will follow up with GI and PCP.  He is agreed with plan of care and with questions or concerns.  Patient discharged in good condition after reassuring work-up.  Final Clinical Impression(s) / ED Diagnoses Final diagnoses:  Chest pain, unspecified type    Rx / DC Orders ED Discharge Orders         Ordered    omeprazole (PRILOSEC) 20 MG capsule  Daily        10/14/20 1515    sucralfate (CARAFATE) 1 g tablet  3 times daily with meals & bedtime        10/14/20 1515          Clinical Impression: 1. Chest pain, unspecified type     Disposition: Discharge  Condition: Good  I have discussed the results, Dx and Tx plan with the pt(& family if present). He/she/they expressed understanding and agree(s) with the plan. Discharge instructions discussed at great length. Strict return precautions discussed and pt &/or family have verbalized understanding of the instructions. No further questions at time of discharge.    Discharge Medication List as of 10/14/2020  3:18 PM    START taking these medications   Details  omeprazole (PRILOSEC) 20 MG capsule Take 1 capsule (20 mg total) by mouth daily., Starting Tue 10/14/2020, Print    sucralfate (CARAFATE) 1 g tablet Take 1 tablet (1 g total) by mouth 4 (four) times daily -  with  meals and at bedtime., Starting Tue 10/14/2020, Print        Follow Up: [name], MD [address], [place] [address] [phone]     [facility] Gastroenterology [place] [phone]       Christropher Gintz, Gwenyth Allegra, MD 10/14/20 [phone]

## 2020-10-14 NOTE — ED Triage Notes (Signed)
BIB [facility] after pt reported increase C/P x 2 weeks. Pt reports intermittent chest pressure particularly after eating. Pt endorses current gastric ulcer. Pt given 324 ASA PO, 0.4 SL nitro. Pt pain alleviated with SL Nitro. HX of Mi in 2012.

## 2020-10-14 NOTE — Discharge Instructions (Signed)
Your history, exam and work-up today was overall reassuring.  As we discussed, based on description of symptoms I suspect is more reflux related than a cardiac cause.  Your troponin was negative both times we checked it.  Your chest x-ray is reassuring.  Your lipase was slightly elevated although it is not high enough for [place] to suspect a severe pancreatitis causing her symptoms.  Please take the medicine to help with the discomfort likely from reflux and follow-up with a GI doctor.  Please rest and stay hydrated.  If any symptoms change or worsen, return to the nearest emergency department.

## 2020-10-14 NOTE — ED Notes (Addendum)
Placed pt on pocket monitor.

## 2021-02-04 ENCOUNTER — Other Ambulatory Visit: Payer: Self-pay

## 2021-02-04 ENCOUNTER — Emergency Department (HOSPITAL_COMMUNITY)
Admission: EM | Admit: 2021-02-04 | Discharge: 2021-02-05 | Disposition: A | Discharge status: Other Acute Inpatient Hospital | Payer: Medicaid Other | Attending: Emergency Medicine | Admitting: Emergency Medicine

## 2021-02-04 ENCOUNTER — Encounter (HOSPITAL_COMMUNITY): Payer: Self-pay

## 2021-02-04 DIAGNOSIS — F17210 Nicotine dependence, cigarettes, uncomplicated: Secondary | ICD-10-CM | POA: Diagnosis not present

## 2021-02-04 DIAGNOSIS — F333 Major depressive disorder, recurrent, severe with psychotic symptoms: Secondary | ICD-10-CM | POA: Insufficient documentation

## 2021-02-04 DIAGNOSIS — Z20822 Contact with and (suspected) exposure to covid-19: Secondary | ICD-10-CM | POA: Diagnosis not present

## 2021-02-04 DIAGNOSIS — R45851 Suicidal ideations: Secondary | ICD-10-CM | POA: Diagnosis not present

## 2021-02-04 DIAGNOSIS — I2510 Atherosclerotic heart disease of native coronary artery without angina pectoris: Secondary | ICD-10-CM | POA: Diagnosis not present

## 2021-02-04 LAB — COMPREHENSIVE METABOLIC PANEL
ALT: 25 U/L (ref 0–44)
AST: 29 U/L (ref 15–41)
Albumin: 4.5 g/dL (ref 3.5–5.0)
Alkaline Phosphatase: 76 U/L (ref 38–126)
Anion gap: 11 (ref 5–15)
BUN: 10 mg/dL (ref 6–20)
CO2: 23 mmol/L (ref 22–32)
Calcium: 9 mg/dL (ref 8.9–10.3)
Chloride: 110 mmol/L (ref 98–111)
Creatinine, Ser: 1.02 mg/dL (ref 0.61–1.24)
GFR, Estimated: >60 mL/min (ref >60)
Glucose, Bld: 86 mg/dL (ref 70–99)
Potassium: 4 mmol/L (ref 3.5–5.1)
Sodium: 144 mmol/L (ref 135–145)
Total Bilirubin: 0.6 mg/dL (ref 0.3–1.2)
Total Protein: 8.1 g/dL (ref 6.5–8.1)

## 2021-02-04 LAB — CBC WITH DIFFERENTIAL/PLATELET
Abs Immature Granulocytes: 0.01 10*3/uL (ref 0.00–0.07)
Basophils Absolute: 0 10*3/uL (ref 0.0–0.1)
Basophils Relative: 1 %
Eosinophils Absolute: 0.1 10*3/uL (ref 0.0–0.5)
Eosinophils Relative: 1 %
HCT: 42.2 % (ref 39.0–52.0)
Hemoglobin: 14.6 g/dL (ref 13.0–17.0)
Immature Granulocytes: 0 %
Lymphocytes Relative: 28 %
Lymphs Abs: 1.6 10*3/uL (ref 0.7–4.0)
MCH: 31.1 pg (ref 26.0–34.0)
MCHC: 34.6 g/dL (ref 30.0–36.0)
MCV: 90 fL (ref 80.0–100.0)
Monocytes Absolute: 0.5 10*3/uL (ref 0.1–1.0)
Monocytes Relative: 9 %
Neutro Abs: 3.5 10*3/uL (ref 1.7–7.7)
Neutrophils Relative %: 61 %
Platelets: 275 10*3/uL (ref 150–400)
RBC: 4.69 MIL/uL (ref 4.22–5.81)
RDW: 13.6 % (ref 11.5–15.5)
WBC: 5.7 10*3/uL (ref 4.0–10.5)
nRBC: 0 % (ref 0.0–0.2)

## 2021-02-04 LAB — URINALYSIS, ROUTINE W REFLEX MICROSCOPIC
Bilirubin Urine: NEGATIVE
Glucose, UA: NEGATIVE mg/dL
Hgb urine dipstick: NEGATIVE
Ketones, ur: NEGATIVE mg/dL
Leukocytes,Ua: NEGATIVE
Nitrite: NEGATIVE
Protein, ur: NEGATIVE mg/dL
Specific Gravity, Urine: 1.02 (ref 1.005–1.030)
pH: 6 (ref 5.0–8.0)

## 2021-02-04 LAB — RAPID URINE DRUG SCREEN, HOSP PERFORMED
Amphetamines: NOT DETECTED
Barbiturates: NOT DETECTED
Benzodiazepines: NOT DETECTED
Cocaine: POSITIVE — AB
Opiates: NOT DETECTED
Tetrahydrocannabinol: POSITIVE — AB

## 2021-02-04 LAB — ACETAMINOPHEN LEVEL: Acetaminophen (Tylenol), Serum: <10 ug/mL — ABNORMAL LOW (ref 10–30)

## 2021-02-04 LAB — RESP PANEL BY RT-PCR (FLU A&B, COVID) ARPGX2
Influenza A by PCR: NEGATIVE
Influenza B by PCR: NEGATIVE
SARS Coronavirus 2 by RT PCR: NEGATIVE

## 2021-02-04 LAB — ETHANOL: Alcohol, Ethyl (B): 175 mg/dL — ABNORMAL HIGH (ref <10)

## 2021-02-04 NOTE — ED Triage Notes (Signed)
Pt states that "I am this close to running to the nearest bridge and jumping off." Pt reports having a hard life and that he has been suicidal for years. Pt also admits to doing drugs including marijuana and cocaine.

## 2021-02-04 NOTE — BHH Counselor (Signed)
Attempted to reach Pt by cart.  The cart is not picking up.  Attending RN advised.

## 2021-02-04 NOTE — ED Provider Notes (Signed)
[facility] DEPT Provider Note   CSN: [id number] Arrival date & time: 02/04/21  0533     History Chief Complaint  Patient presents with   Suicidal    [name] [name] is a 55 y.o. male.  Patient is a 55 year old male with past medical history of GERD, coronary artery disease, and polysubstance abuse.  Patient presenting today with complaints of suicidal ideation.  Patient reports multiple issues causing him stress and reports thoughts of jumping off of an overpass or building.  He describes "self-medicating" with crack cocaine, alcohol.  The history is provided by the patient.      Past Medical History:  Diagnosis Date   Abnormal ECG    a. early repolarization   Arthritis    "my whole left side" (05/03/2014)   Bipolar disorder (H[facility])    Bradycardia    a. asymptomatic   CAD in native artery    a. Nonobstructive cath 11/2007;  b. Presented with ST elevation - Nonobstructive cath 08/2011   Chest pain, mid sternal    Coronary artery disease    GERD (gastroesophageal reflux disease)    History of cocaine abuse ([place])    a. quit ? 2009   History of ETOH abuse    a. drinks 2 "40's" / wk   Marijuana abuse    a. uses ~ 1x /wk or less   Pneumonia    Stomach ulcer    Syncope    a. 12/2010 - presumed to be vasovagal   Tobacco abuse     Patient Active Problem List   Diagnosis Date Noted   MDD (major depressive disorder), recurrent episode, severe ([place]) 03/24/2019   MDD (major depressive disorder), recurrent severe, without psychosis ([place]) 02/08/2018   Major depressive disorder, recurrent episode, severe with anxious distress ([name]) 05/23/2016   PTSD (post-traumatic stress disorder) 05/23/2016   Cocaine use disorder, moderate, in early remission ([place]) 05/23/2016   Alcohol use disorder, moderate, dependence ([place]) 05/23/2016   Affective psychosis, bipolar ([place]) 04/30/2016   Cocaine abuse with cocaine-induced mood disorder ([place]) 03/04/2016   Alcohol  dependence with uncomplicated withdrawal ([place])    Alcohol use disorder, severe, dependence ([place]) 01/18/2015   Alcohol dependence with alcohol-induced mood disorder ([place])    Suicidal ideation    Acute pancreatitis 03/20/2012   Polysubstance abuse ([place]) 03/20/2012   Cannabis abuse 01/12/2012   Alcohol dependence ([place]) 10/15/2011   Abnormal ECG    Chest pain    CAD in native artery    GERD (gastroesophageal reflux disease)    Tobacco abuse    History of cocaine abuse ([place])    History of ETOH abuse    Bradycardia    Syncope     Past Surgical History:  Procedure Laterality Date   CARDIAC CATHETERIZATION  2009; 08/2011   [name] 08/06/2011   LEFT HEART CATHETERIZATION WITH CORONARY ANGIOGRAM N/A 08/16/2011   Procedure: LEFT HEART CATHETERIZATION WITH CORONARY ANGIOGRAM;  Surgeon: [name], MD;  Location: [facility] CATH LAB;  Service: Cardiovascular;  Laterality: N/A;       History reviewed. No pertinent family history.  Social History   Tobacco Use   Smoking status: Some Days    Packs/day: 0.25    Years: 2.00    Pack years: 0.50    Types: Cigarettes    Last attempt to quit: 06/24/2015    Years since quitting: 5.6   Smokeless tobacco: Never  Vaping Use   Vaping Use: Never used  Substance Use Topics  Alcohol use: Yes    Alcohol/week: 48.0 standard drinks    Types: 48 Cans of beer per week    Comment: 2 shot bottles yesterday   Drug use: Yes    Types: Marijuana, Cocaine    Comment: Crack one year ago and marijuana was yesterday    Home Medications Prior to Admission medications   Medication Sig Start Date End Date Taking? Authorizing Provider  escitalopram (LEXAPRO) 10 MG tablet Take 1 tablet (10 mg total) by mouth daily. 05/15/20   Money, Lowry Ram, FNP  gabapentin (NEURONTIN) 300 MG capsule Take 1 capsule (300 mg total) by mouth 3 (three) times daily. 05/14/20   Money, Lowry Ram, FNP  omeprazole (PRILOSEC) 20 MG capsule Take 1 capsule (20 mg total) by mouth daily.  10/14/20   Tegeler, Gwenyth Allegra, MD  sucralfate (CARAFATE) 1 g tablet Take 1 tablet (1 g total) by mouth 4 (four) times daily -  with meals and at bedtime. 10/14/20   Tegeler, Gwenyth Allegra, MD    Allergies    Aspirin, Pepperoni [pickled meat], and Tomato  Review of Systems   Review of Systems  All other systems reviewed and are negative.  Physical Exam Updated Vital Signs BP 128/78   Pulse 78   Temp 98.2 F (36.8 C) (Oral)   Resp 14   SpO2 98%   Physical Exam Vitals and nursing note reviewed.  Constitutional:      General: He is not in acute distress.    Appearance: He is well-developed. He is not diaphoretic.  HENT:     Head: Normocephalic and atraumatic.  Cardiovascular:     Rate and Rhythm: Normal rate and regular rhythm.     Heart sounds: No murmur heard.   No friction rub.  Pulmonary:     Effort: Pulmonary effort is normal. No respiratory distress.     Breath sounds: Normal breath sounds. No wheezing or rales.  Abdominal:     General: Bowel sounds are normal. There is no distension.     Palpations: Abdomen is soft.     Tenderness: There is no abdominal tenderness.  Musculoskeletal:        General: Normal range of motion.     Cervical back: Normal range of motion and neck supple.  Skin:    General: Skin is warm and dry.  Neurological:     Mental Status: He is alert and oriented to person, place, and time.     Coordination: Coordination normal.  Psychiatric:        Attention and Perception: Attention normal.        Mood and Affect: Mood is depressed.        Speech: Speech normal.        Behavior: Behavior normal.        Thought Content: Thought content is not delusional. Thought content includes suicidal ideation. Thought content does not include homicidal ideation. Thought content includes suicidal plan. Thought content does not include homicidal plan.        Cognition and Memory: Cognition normal.        Judgment: Judgment is impulsive.    ED Results /  Procedures / Treatments   Labs (all labs ordered are listed, but only abnormal results are displayed) Labs Reviewed  RESP PANEL BY RT-PCR (FLU A&B, COVID) ARPGX2  CBC WITH DIFFERENTIAL/PLATELET  COMPREHENSIVE METABOLIC PANEL  ETHANOL  ACETAMINOPHEN LEVEL  RAPID URINE DRUG SCREEN, HOSP PERFORMED  URINALYSIS, ROUTINE W REFLEX MICROSCOPIC    EKG None  Radiology No results found.  Procedures Procedures   Medications Ordered in ED Medications - No data to display  ED Course  I have reviewed the triage vital signs and the nursing notes.  Pertinent labs & imaging results that were available during my care of the patient were reviewed by me and considered in my medical decision making (see chart for details).    MDM Rules/Calculators/A&P  Laboratory studies pending.  Once patient is medically cleared, he will undergo consultation with TTS who will assist in determining the final disposition.  Final Clinical Impression(s) / ED Diagnoses Final diagnoses:  None    Rx / DC Orders ED Discharge Orders     None        Veryl Speak, MD 02/04/21 [phone]

## 2021-02-04 NOTE — ED Notes (Signed)
Pt belonging have placed behind nurses station hall B ([phone]

## 2021-02-04 NOTE — BHH Counselor (Signed)
Requested cart

## 2021-02-04 NOTE — ED Notes (Signed)
Pt aware of urine sample 

## 2021-02-04 NOTE — ED Notes (Addendum)
Pt belonging: jean, brown shirt, brown, shoes, blue backpack inside charge belt, one blk cell phone, one blk wallet, $1.73  Place at triage desk(Bagsx2)
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
## 2021-02-05 ENCOUNTER — Other Ambulatory Visit: Payer: Self-pay

## 2021-02-05 ENCOUNTER — Other Ambulatory Visit (HOSPITAL_COMMUNITY)
Admission: EM | Admit: 2021-02-05 | Discharge: 2021-02-07 | Disposition: A | Discharge status: Home / Self Care | Payer: Medicaid Other | Attending: Psychiatry | Admitting: Psychiatry

## 2021-02-05 DIAGNOSIS — F319 Bipolar disorder, unspecified: Secondary | ICD-10-CM | POA: Insufficient documentation

## 2021-02-05 DIAGNOSIS — Z9151 Personal history of suicidal behavior: Secondary | ICD-10-CM | POA: Insufficient documentation

## 2021-02-05 DIAGNOSIS — Z560 Unemployment, unspecified: Secondary | ICD-10-CM | POA: Insufficient documentation

## 2021-02-05 DIAGNOSIS — F332 Major depressive disorder, recurrent severe without psychotic features: Secondary | ICD-10-CM

## 2021-02-05 DIAGNOSIS — F1020 Alcohol dependence, uncomplicated: Secondary | ICD-10-CM | POA: Diagnosis not present

## 2021-02-05 DIAGNOSIS — F1210 Cannabis abuse, uncomplicated: Secondary | ICD-10-CM | POA: Insufficient documentation

## 2021-02-05 DIAGNOSIS — F1410 Cocaine abuse, uncomplicated: Secondary | ICD-10-CM | POA: Diagnosis not present

## 2021-02-05 DIAGNOSIS — F17210 Nicotine dependence, cigarettes, uncomplicated: Secondary | ICD-10-CM | POA: Insufficient documentation

## 2021-02-05 DIAGNOSIS — Z79899 Other long term (current) drug therapy: Secondary | ICD-10-CM | POA: Insufficient documentation

## 2021-02-05 DIAGNOSIS — Z818 Family history of other mental and behavioral disorders: Secondary | ICD-10-CM | POA: Insufficient documentation

## 2021-02-05 MED ORDER — TRAZODONE HCL 50 MG PO TABS
50.0000 mg | ORAL_TABLET | Freq: Every evening | ORAL | Status: DC | PRN
  Administered 2021-02-05 – 2021-02-06 (×2): 50 mg via ORAL
  Filled 2021-02-05 (×2): qty 1

## 2021-02-05 MED ORDER — GABAPENTIN 300 MG PO CAPS
300.0000 mg | ORAL_CAPSULE | Freq: Three times a day (TID) | ORAL | Status: DC
  Administered 2021-02-05 – 2021-02-07 (×6): 300 mg via ORAL
  Filled 2021-02-05 (×6): qty 1

## 2021-02-05 MED ORDER — ONDANSETRON 4 MG PO TBDP: 4.0000 mg | ORAL_TABLET | Freq: Four times a day (QID) | ORAL | Status: DC | PRN

## 2021-02-05 MED ORDER — ALUM & MAG HYDROXIDE-SIMETH 200-200-20 MG/5ML PO SUSP: 30.0000 mL | ORAL | Status: DC | PRN

## 2021-02-05 MED ORDER — PANTOPRAZOLE SODIUM 40 MG PO TBEC
40.0000 mg | DELAYED_RELEASE_TABLET | Freq: Every day | ORAL | Status: DC
  Administered 2021-02-05 – 2021-02-07 (×3): 40 mg via ORAL
  Filled 2021-02-05 (×3): qty 1

## 2021-02-05 MED ORDER — MAGNESIUM HYDROXIDE 400 MG/5ML PO SUSP: 30.0000 mL | Freq: Every day | ORAL | Status: DC | PRN

## 2021-02-05 MED ORDER — THIAMINE HCL 100 MG PO TABS
100.0000 mg | ORAL_TABLET | Freq: Every day | ORAL | Status: DC
  Administered 2021-02-05 – 2021-02-06 (×2): 100 mg via ORAL
  Filled 2021-02-05 (×3): qty 1

## 2021-02-05 MED ORDER — HYDROXYZINE HCL 25 MG PO TABS
25.0000 mg | ORAL_TABLET | Freq: Four times a day (QID) | ORAL | Status: DC | PRN
  Administered 2021-02-06 – 2021-02-07 (×2): 25 mg via ORAL
  Filled 2021-02-05 (×2): qty 1

## 2021-02-05 MED ORDER — LORAZEPAM 1 MG PO TABS: 1.0000 mg | ORAL_TABLET | Freq: Four times a day (QID) | ORAL | Status: DC | PRN

## 2021-02-05 MED ORDER — ESCITALOPRAM OXALATE 10 MG PO TABS
10.0000 mg | ORAL_TABLET | Freq: Every day | ORAL | Status: DC
  Administered 2021-02-05 – 2021-02-07 (×3): 10 mg via ORAL
  Filled 2021-02-05 (×3): qty 1

## 2021-02-05 MED ORDER — ADULT MULTIVITAMIN W/MINERALS CH
1.0000 | ORAL_TABLET | Freq: Every day | ORAL | Status: DC
  Administered 2021-02-05 – 2021-02-06 (×2): 1 via ORAL
  Filled 2021-02-05 (×3): qty 1

## 2021-02-05 MED ORDER — NICOTINE POLACRILEX 2 MG MT GUM: 2.0000 mg | CHEWING_GUM | OROMUCOSAL | Status: DC | PRN

## 2021-02-05 MED ORDER — ACETAMINOPHEN 325 MG PO TABS: 650.0000 mg | ORAL_TABLET | Freq: Four times a day (QID) | ORAL | Status: DC | PRN

## 2021-02-05 MED ORDER — LOPERAMIDE HCL 2 MG PO CAPS: 2.0000 mg | ORAL_CAPSULE | ORAL | Status: DC | PRN

## 2021-02-05 NOTE — ED Notes (Signed)
Pt leaving via [facility].

## 2021-02-05 NOTE — ED Provider Notes (Signed)
Excepted to [facility] by Dr. Serafina Mitchell.  Awaiting transport.   Lennice Sites, DO 02/05/21 1304

## 2021-02-05 NOTE — ED Notes (Signed)
Patient in bed resting comfortably at this time. He is cooperative and denies any concerns.

## 2021-02-05 NOTE — ED Notes (Signed)
Patient did not attend wrap up group, but was present for AA group and gave positive feedback throughout the group.

## 2021-02-05 NOTE — ED Notes (Signed)
[facility] called 

## 2021-02-05 NOTE — ED Provider Notes (Addendum)
Behavioral Health Admission H&P [facility] & OBS)  Date: 02/05/21 Patient Name: [name] [name] MRN: [id number] Chief Complaint: No chief complaint on file.     Diagnoses:  Final diagnoses:  Severe episode of recurrent major depressive disorder, without psychotic features (HCC)  Alcohol use disorder, severe, dependence (HCC)  Cannabis abuse  Cocaine abuse ([place])    HPI:  55 yo male with a history of depression, substance use who presents as a transfer from [facility] ED to [facility] for continued care. He presented to [facility] ED on 8/31 with SI and substance use.  On chart review, he had reported being depressed for years due to his wifes passing 18 yrs ago. He expressed SI with a plan [place]. Reported regular use of MJ, etoh, crack cocaine. BAL 175 on presentation. UDS+cocaine, THC.  On interview upon arrival at the [facility], patient is calm, cooperative and pleasant. He indicates his reason for presenting to the [facility] ED as above. He goes on to say that he states that he has been "stressed for many years" and reports having 7 kids and 24 grandkids. He states that his wife passed away 18 years ago and that his partner of 14 years passed away within the last ~3 years. Patient states that as a result of these stressors "I turn to alcohol". His most recent stressor he identifies as his living situation. He states that he has been that he has been living with his son who has 2 young children and that his son "is not patient" with the children and that there is now DSS involvement.  He reports drinking 2-3 40 oz beers a day for the last 2.5 months; last drink was yesterday. He denies sx of alcohol withdrawal including tremors, headache, diaphoresis, GI upset although does report some heart burn. He denies h/o alcohol withdrawal or seizures in the passt. He states that prior to 2.5 months ago, he had been sober for about 7 months. He attributes his ablity to stay sober during this time to being around his  mother and daughter while dealing with a family crisis.  He reports using marijuana"every other day"; about 5-10 dollars worth; denies synthetic marijuana use. He reports using crack cocaine within the last 1-2 months, prior to this he states that he had been sover from crack cocaine for approximately 1 year.   He recalls previous hospitalization at [facility] and states that gabapentin and lexapro were helpful although he had difficulty with medication compliance after discharge. He describes his mood as "in a slump";  rates it 5/10 (10 being the best) and reports poor sleep (3-4 hours a night), hopelessness, low appetite, poor concentration. Denies anhedonia. Denies SI plan or intent on interview and states that he is looking forward to get help. He denies HI/AVH  Past Psychiatric History: Previous Medication Trials: lexapro, gabapentin Previous Psychiatric Hospitalizations: yes Previous Suicide Attempts: yes -2x . Walked in front of a moving vehicle and pills. Most recent attempt years ago History of Violence: no Outpatient psychiatrist: no  Social History: Marital Status: widowed Children: 27; 21 grandkids (aged 23 and below) Source of Income: disability for heart disease (bypass twice) Education:  11th grade Special Ed: yes Housing Status: with son  History of phys/sexual abuse: no Easy access to gun: no  Substance Use (with emphasis over the last 12 months) Recreational Drugs: crack cocaine, mj, etoh Use of Alcohol:  yes Tobacco Use: yes, 1/2 ppd Rehab History: yes- been to [facility]  in the past H/O Complicated Withdrawal: no  Legal History: Past Charges/Incarcerations: yes Pending charges: no  Family Psychiatric History: Sisters with depression   PHQ 2-9:  [place] ED from 02/04/2021 in [place] DEPT Documentation from 10/29/2018 in [place] Documentation from 10/27/2018 in [place]  Thoughts that you would be  better off dead, or of hurting yourself in some way More than half the days Not at all Not at all  PHQ-9 Total Score 13 5 0       [place] ED from 02/05/2021 in [facility] ED from 02/04/2021 in [place] DEPT ED from 10/14/2020 in [place] High Risk Moderate Risk No Risk        Total Time spent with patient: 45 minutes  Musculoskeletal  Strength & Muscle Tone: within normal limits Gait & Station: normal Patient leans: N/A  Psychiatric Specialty Exam  Presentation General Appearance: Appropriate for Environment; Casual  Eye Contact:Good  Speech:Clear and Coherent; Normal Rate  Speech Volume:Normal  Handedness:Right   Mood and Affect  Mood:Euthymic  Affect:Appropriate; Congruent   Thought Process  Thought Processes:Coherent; Goal Directed; Linear  Descriptions of Associations:Intact  Orientation:Full (Time, Place and Person)  Thought Content:WDL; Logical  Diagnosis of Schizophrenia or Schizoaffective disorder in past: No   Hallucinations:Hallucinations: None  Ideas of Reference:None  Suicidal Thoughts:Suicidal Thoughts: No   Homicidal Thoughts:Homicidal Thoughts: No   Sensorium  Memory:Immediate Good; Recent Good; Remote Good  Judgment:Fair  Insight:Fair   Executive Functions  Concentration:Good  Attention Span:Good  [place] of Knowledge:Good  Language:Good   Psychomotor Activity  Psychomotor Activity:Psychomotor Activity: Normal   Assets  Assets:Communication Skills; Desire for Improvement; Financial Resources/Insurance; Resilience; Social Support   Sleep  Sleep:Sleep: Poor   Nutritional Assessment (For OBS and FBC admissions only) Has the patient had a weight loss or gain of 10 pounds or more in the last 3 months?: -- (patient unsure) Has the patient had a decrease in food intake/or appetite?:  Yes Does the patient have dental problems?: No Does the patient have eating habits or behaviors that may be indicators of an eating disorder including binging or inducing vomiting?: No Has the patient recently lost weight without trying?: Patient is unsure Has the patient been eating poorly because of a decreased appetite?: Yes Malnutrition Screening Tool Score: 3 Nutritional Assessment Referrals: Refer to Primary Care Provider   Physical Exam Constitutional:      Appearance: Normal appearance. He is normal weight.  HENT:     Head: Normocephalic and atraumatic.  Eyes:     Extraocular Movements: Extraocular movements intact.  Pulmonary:     Effort: Pulmonary effort is normal.  Neurological:     General: No focal deficit present.     Mental Status: He is alert and oriented to person, place, and time.  Psychiatric:        Attention and Perception: Attention and perception normal.        Speech: Speech normal.        Behavior: Behavior normal. Behavior is cooperative.        Thought Content: Thought content normal.   Review of Systems  Constitutional:  Negative for chills and fever.  HENT:  Negative for hearing loss.   Eyes:  Negative for discharge and redness.  Respiratory:  Negative for cough.   Cardiovascular:  Negative for chest pain.  Gastrointestinal:  Positive for heartburn.  Musculoskeletal:  Negative for myalgias.  Neurological:  Negative for headaches.  Psychiatric/Behavioral:  Positive for depression and substance abuse. The patient is nervous/anxious.    Blood pressure 110/78, pulse (!) 48, temperature 97.7 F (36.5 C), temperature source Tympanic, resp. rate 18, SpO2 100 %. There is no height or weight on file to calculate BMI.    Is the patient at risk to self? Yes  Has the patient been a risk to self in the past 6 months? No .    Has the patient been a risk to self within the distant past? Yes   Is the patient a risk to others? No   Has the patient been a  risk to others in the past 6 months? No   Has the patient been a risk to others within the distant past? No   Past Medical History:  Past Medical History:  Diagnosis Date   Abnormal ECG    a. early repolarization   Arthritis    "my whole left side" (05/03/2014)   Bipolar disorder (HCC)    Bradycardia    a. asymptomatic   CAD in native artery    a. Nonobstructive cath 11/2007;  b. Presented with ST elevation - Nonobstructive cath 08/2011   Chest pain, mid sternal    Coronary artery disease    GERD (gastroesophageal reflux disease)    History of cocaine abuse ([place])    a. quit ? 2009   History of ETOH abuse    a. drinks 2 "40's" / wk   Marijuana abuse    a. uses ~ 1x /wk or less   Pneumonia    Stomach ulcer    Syncope    a. 12/2010 - presumed to be vasovagal   Tobacco abuse     Past Surgical History:  Procedure Laterality Date   CARDIAC CATHETERIZATION  2009; 08/2011   [name] 08/06/2011   LEFT HEART CATHETERIZATION WITH CORONARY ANGIOGRAM N/A 08/16/2011   Procedure: LEFT HEART CATHETERIZATION WITH CORONARY ANGIOGRAM;  Surgeon: [name], MD;  Location: [facility] CATH LAB;  Service: Cardiovascular;  Laterality: N/A;    Family History: No family history on file.  Social History:  Social History   Socioeconomic History   Marital status: Widowed    Spouse name: Not on file   Number of children: 7   Years of education: Not on file   Highest education level: 10th grade  Occupational History   Occupation: unemployed  Tobacco Use   Smoking status: Some Days    Packs/day: 0.25    Years: 2.00    Pack years: 0.50    Types: Cigarettes    Last attempt to quit: 06/24/2015    Years since quitting: 5.6   Smokeless tobacco: Never  Vaping Use   Vaping Use: Never used  Substance and Sexual Activity   Alcohol use: Yes    Alcohol/week: 48.0 standard drinks    Types: 48 Cans of beer per week    Comment: 2 shot bottles yesterday   Drug use: Yes    Types: Marijuana, Cocaine     Comment: Crack one year ago and marijuana was yesterday   Sexual activity: Not Currently  Other Topics Concern   Not on file  Social History Narrative   Lives with his daughter currently and receives SSI.    Social Determinants of Health   Financial Resource Strain: Not on file  Food Insecurity: Not on file  Transportation Needs: Not on file  Physical Activity: Not on  file  Stress: Not on file  Social Connections: Not on file  Intimate Partner Violence: Not on file    SDOH:  SDOH Screenings   Alcohol Screen: Not on file  Depression (PHQ2-9): Medium Risk   PHQ-2 Score: 13  Financial Resource Strain: Not on file  Food Insecurity: Not on file  Housing: Not on file  Physical Activity: Not on file  Social Connections: Not on file  Stress: Not on file  Tobacco Use: High Risk   Smoking Tobacco Use: Some Days   Smokeless Tobacco Use: Never  Transportation Needs: Not on file    Last Labs:  Admission on 02/04/2021, Discharged on 02/05/2021  Component Date Value Ref Range Status   WBC 02/04/2021 5.7  4.0 - 10.5 K/uL Final   RBC 02/04/2021 4.69  4.22 - 5.81 MIL/uL Final   Hemoglobin 02/04/2021 14.6  13.0 - 17.0 g/dL Final   HCT 02/04/2021 42.2  39.0 - 52.0 % Final   MCV 02/04/2021 90.0  80.0 - 100.0 fL Final   MCH 02/04/2021 31.1  26.0 - 34.0 pg Final   MCHC 02/04/2021 34.6  30.0 - 36.0 g/dL Final   RDW 02/04/2021 13.6  11.5 - 15.5 % Final   Platelets 02/04/2021 275  150 - 400 K/uL Final   nRBC 02/04/2021 0.0  0.0 - 0.2 % Final   Neutrophils Relative % 02/04/2021 61  % Final   Neutro Abs 02/04/2021 3.5  1.7 - 7.7 K/uL Final   Lymphocytes Relative 02/04/2021 28  % Final   Lymphs Abs 02/04/2021 1.6  0.7 - 4.0 K/uL Final   Monocytes Relative 02/04/2021 9  % Final   Monocytes Absolute 02/04/2021 0.5  0.1 - 1.0 K/uL Final   Eosinophils Relative 02/04/2021 1  % Final   Eosinophils Absolute 02/04/2021 0.1  0.0 - 0.5 K/uL Final   Basophils Relative 02/04/2021 1  % Final    Basophils Absolute 02/04/2021 0.0  0.0 - 0.1 K/uL Final   Immature Granulocytes 02/04/2021 0  % Final   Abs Immature Granulocytes 02/04/2021 0.01  0.00 - 0.07 K/uL Final   Performed at [facility], [place] [address] [place], [name] [address]   Sodium 02/04/2021 144  135 - 145 mmol/L Final   Potassium 02/04/2021 4.0  3.5 - 5.1 mmol/L Final   Chloride 02/04/2021 110  98 - 111 mmol/L Final   CO2 02/04/2021 23  22 - 32 mmol/L Final   Glucose, Bld 02/04/2021 86  70 - 99 mg/dL Final   Glucose reference range applies only to samples taken after fasting for at least 8 hours.   BUN 02/04/2021 10  6 - 20 mg/dL Final   Creatinine, Ser 02/04/2021 1.02  0.61 - 1.24 mg/dL Final   Calcium 02/04/2021 9.0  8.9 - 10.3 mg/dL Final   Total Protein 02/04/2021 8.1  6.5 - 8.1 g/dL Final   Albumin 02/04/2021 4.5  3.5 - 5.0 g/dL Final   AST 02/04/2021 29  15 - 41 U/L Final   ALT 02/04/2021 25  0 - 44 U/L Final   Alkaline Phosphatase 02/04/2021 76  38 - 126 U/L Final   Total Bilirubin 02/04/2021 0.6  0.3 - 1.2 mg/dL Final   GFR, Estimated 02/04/2021 >60  >60 mL/min Final   Comment: (NOTE) Calculated using the CKD-EPI Creatinine Equation (2021)    Anion gap 02/04/2021 11  5 - 15 Final   Performed at [facility], [place] [address] [place], [place] [address]  Alcohol, Ethyl (B) 02/04/2021 175 (A) <10 mg/dL Final   Comment: (NOTE) Lowest detectable limit for serum alcohol is 10 mg/dL.  For medical purposes only. Performed at [facility], [place] [address] [place], [name] [address]    Acetaminophen (Tylenol), Serum 02/04/2021 <10 (A) 10 - 30 ug/mL Final   Comment: (NOTE) Therapeutic concentrations vary significantly. A range of 10-30 ug/mL  may be an effective concentration for many patients. However, some  are best treated at concentrations outside of this range. Acetaminophen concentrations >150 ug/mL at 4 hours after ingestion  and >50 ug/mL at 12  hours after ingestion are often associated with  toxic reactions.  Performed at [facility], [place] [address], [place], [place] [address]    Opiates 02/04/2021 NONE DETECTED  NONE DETECTED Final   Cocaine 02/04/2021 POSITIVE (A) NONE DETECTED Final   Benzodiazepines 02/04/2021 NONE DETECTED  NONE DETECTED Final   Amphetamines 02/04/2021 NONE DETECTED  NONE DETECTED Final   Tetrahydrocannabinol 02/04/2021 POSITIVE (A) NONE DETECTED Final   Barbiturates 02/04/2021 NONE DETECTED  NONE DETECTED Final   Comment: (NOTE) DRUG SCREEN FOR MEDICAL PURPOSES ONLY.  IF CONFIRMATION IS NEEDED FOR ANY PURPOSE, NOTIFY LAB WITHIN 5 DAYS.  LOWEST DETECTABLE LIMITS FOR URINE DRUG SCREEN Drug Class                     Cutoff (ng/mL) Amphetamine and metabolites    1000 Barbiturate and metabolites    200 Benzodiazepine                 [id number] Tricyclics and metabolites     300 Opiates and metabolites        300 Cocaine and metabolites        300 THC                            50 Performed at [facility], [place] [address] [place], [place] [address]    Color, Urine 02/04/2021 YELLOW  YELLOW Final   APPearance 02/04/2021 CLEAR  CLEAR Final   Specific Gravity, Urine 02/04/2021 1.020  1.005 - 1.030 Final   pH 02/04/2021 6.0  5.0 - 8.0 Final   Glucose, UA 02/04/2021 NEGATIVE  NEGATIVE mg/dL Final   Hgb urine dipstick 02/04/2021 NEGATIVE  NEGATIVE Final   Bilirubin Urine 02/04/2021 NEGATIVE  NEGATIVE Final   Ketones, ur 02/04/2021 NEGATIVE  NEGATIVE mg/dL Final   Protein, ur 02/04/2021 NEGATIVE  NEGATIVE mg/dL Final   Nitrite 02/04/2021 NEGATIVE  NEGATIVE Final   Leukocytes,Ua 02/04/2021 NEGATIVE  NEGATIVE Final   Comment: Microscopic not done on urines with negative protein, blood, leukocytes, nitrite, or glucose < 500 mg/dL. Performed at [facility], [place] [address] [place], [place] [address]    SARS Coronavirus 2 by RT PCR 02/04/2021 NEGATIVE   NEGATIVE Final   Comment: (NOTE) SARS-CoV-2 target nucleic acids are NOT DETECTED.  The SARS-CoV-2 RNA is generally detectable in upper respiratory specimens during the acute phase of infection. The lowest concentration of SARS-CoV-2 viral copies this assay can detect is 138 copies/mL. A negative result does not preclude SARS-Cov-2 infection and should not be used as the sole basis for treatment or other patient management decisions. A negative result may occur with  improper specimen collection/handling, submission of specimen other than nasopharyngeal swab, presence of viral mutation(s) within the areas targeted by this assay, and inadequate number of viral copies(<138 copies/mL). A  negative result must be combined with clinical observations, patient history, and epidemiological information. The expected result is Negative.  Fact Sheet for Patients:  EntrepreneurPulse.com.au  Fact Sheet for Healthcare Providers:  IncredibleEmployment.be  This test is no                          t yet approved or cleared by the [place] FDA and  has been authorized for detection and/or diagnosis of SARS-CoV-2 by FDA under an Emergency Use Authorization (EUA). This EUA will remain  in effect (meaning this test can be used) for the duration of the COVID-19 declaration under Section 564(b)(1) of the Act, 21 U.S.C.section 360bbb-3(b)(1), unless the authorization is terminated  or revoked sooner.       Influenza A by PCR 02/04/2021 NEGATIVE  NEGATIVE Final   Influenza B by PCR 02/04/2021 NEGATIVE  NEGATIVE Final   Comment: (NOTE) The Xpert Xpress SARS-CoV-2/FLU/RSV plus assay is intended as an aid in the diagnosis of influenza from Nasopharyngeal swab specimens and should not be used as a sole basis for treatment. Nasal washings and aspirates are unacceptable for Xpert Xpress SARS-CoV-2/FLU/RSV testing.  Fact Sheet for  Patients: EntrepreneurPulse.com.au  Fact Sheet for Healthcare Providers: IncredibleEmployment.be  This test is not yet approved or cleared by the [place] FDA and has been authorized for detection and/or diagnosis of SARS-CoV-2 by FDA under an Emergency Use Authorization (EUA). This EUA will remain in effect (meaning this test can be used) for the duration of the COVID-19 declaration under Section 564(b)(1) of the Act, 21 U.S.C. section 360bbb-3(b)(1), unless the authorization is terminated or revoked.  Performed at [facility], [place] [address], [place], [place] [address]   Admission on 10/14/2020, Discharged on 10/14/2020  Component Date Value Ref Range Status   WBC 10/14/2020 4.3  4.0 - 10.5 K/uL Final   RBC 10/14/2020 4.47  4.22 - 5.81 MIL/uL Final   Hemoglobin 10/14/2020 13.5  13.0 - 17.0 g/dL Final   HCT 10/14/2020 39.7  39.0 - 52.0 % Final   MCV 10/14/2020 88.8  80.0 - 100.0 fL Final   MCH 10/14/2020 30.2  26.0 - 34.0 pg Final   MCHC 10/14/2020 34.0  30.0 - 36.0 g/dL Final   RDW 10/14/2020 13.2  11.5 - 15.5 % Final   Platelets 10/14/2020 287  150 - 400 K/uL Final   nRBC 10/14/2020 0.0  0.0 - 0.2 % Final   Neutrophils Relative % 10/14/2020 42  % Final   Neutro Abs 10/14/2020 1.8  1.7 - 7.7 K/uL Final   Lymphocytes Relative 10/14/2020 42  % Final   Lymphs Abs 10/14/2020 1.8  0.7 - 4.0 K/uL Final   Monocytes Relative 10/14/2020 11  % Final   Monocytes Absolute 10/14/2020 0.5  0.1 - 1.0 K/uL Final   Eosinophils Relative 10/14/2020 4  % Final   Eosinophils Absolute 10/14/2020 0.2  0.0 - 0.5 K/uL Final   Basophils Relative 10/14/2020 1  % Final   Basophils Absolute 10/14/2020 0.0  0.0 - 0.1 K/uL Final   Immature Granulocytes 10/14/2020 0  % Final   Abs Immature Granulocytes 10/14/2020 0.01  0.00 - 0.07 K/uL Final   Performed at [facility], [place] [address], [place], [name] [address]   Sodium 10/14/2020 134 (A)  135 - 145 mmol/L Final   Potassium 10/14/2020 4.4  3.5 - 5.1 mmol/L Final   Chloride 10/14/2020 103  98 - 111 mmol/L Final  CO2 10/14/2020 26  22 - 32 mmol/L Final   Glucose, Bld 10/14/2020 95  70 - 99 mg/dL Final   Glucose reference range applies only to samples taken after fasting for at least 8 hours.   BUN 10/14/2020 17  6 - 20 mg/dL Final   Creatinine, Ser 10/14/2020 1.19  0.61 - 1.24 mg/dL Final   Calcium 10/14/2020 9.0  8.9 - 10.3 mg/dL Final   Total Protein 10/14/2020 6.9  6.5 - 8.1 g/dL Final   Albumin 10/14/2020 3.8  3.5 - 5.0 g/dL Final   AST 10/14/2020 18  15 - 41 U/L Final   ALT 10/14/2020 14  0 - 44 U/L Final   Alkaline Phosphatase 10/14/2020 62  38 - 126 U/L Final   Total Bilirubin 10/14/2020 0.4  0.3 - 1.2 mg/dL Final   GFR, Estimated 10/14/2020 >60  >60 mL/min Final   Comment: (NOTE) Calculated using the CKD-EPI Creatinine Equation (2021)    Anion gap 10/14/2020 5  5 - 15 Final   Performed at [place] [address], [place], [name] [address]   Lactic Acid, Venous 10/14/2020 0.8  0.5 - 1.9 mmol/L Final   Performed at [place] [address] [place], [name] [address]   Lactic Acid, Venous 10/14/2020 0.9  0.5 - 1.9 mmol/L Final   Performed at [place] [address], [place], [name] [address]   Lipase 10/14/2020 60 (A) 11 - 51 U/L Final   Performed at [place] [address], [place], [place] [address]   Troponin I (High Sensitivity) 10/14/2020 <2  <18 ng/L Final   Comment: (NOTE) Elevated high sensitivity troponin I (hsTnI) values and significant  changes across serial measurements may suggest ACS but many other  chronic and acute conditions are known to elevate hsTnI results.  Refer to the "Links" section for chest pain algorithms and additional  guidance. Performed at [facility], [place] [address], [place], [place] [address]    Magnesium 10/14/2020 2.0  1.7 - 2.4 mg/dL Final   Performed at [place] [address] [place], [place] [address]   TSH 10/14/2020 1.337  0.350 - 4.500 uIU/mL Final   Comment: Performed by a 3rd Generation assay with a functional sensitivity of <=0.01 uIU/mL. Performed at [facility], [place] [address], [place], [place] [address]    Troponin I (High Sensitivity) 10/14/2020 <2  <18 ng/L Final   Comment: (NOTE) Elevated high sensitivity troponin I (hsTnI) values and significant  changes across serial measurements may suggest ACS but many other  chronic and acute conditions are known to elevate hsTnI results.  Refer to the "Links" section for chest pain algorithms and additional  guidance. Performed at [facility], [place] [address], [place], [place] [address]     Allergies: Aspirin, Pepperoni [pickled meat], and Tomato  PTA Medications: (Not in a hospital admission)   Medical Decision Making  55 yo male with history of AUD and MDD who presents to the [facility] as a transfer from [facility] ED for SI. UDS+THC, cocaine. BAL 175 on presentation to the ED. On interview at the [facility], he reports interest in rehab. He is amenable to restart gabapentin for AUD and lexapro for mood. Pt reported drinking 2-3 40 ox beer daily for ~2.5 months, he denies h/o etoh withdrawal and is not currently reporting sx of etoh withdrawal, but will place on detox protocol with CIWA out  of caution. Patient is appropriate for treatment at the [facility] at this time  MDD -re-start lexapro 10 mg  AUD -re-start gabapentin 300 mg TID for etoh withdrawal  -detox protocol- ativan 1 mg q6 hrs prn CIWA>10 -pt expresses interest in substance [place] treatment  Cocaine use disorder Cannabis use disorder -will counsel on cessation -pt expresses interest in substance use treatment  Nicotine dependence -PRN nicorette gum for nicotine cravings  Dispo: ongoing. Likely discharge to inpatient rehab/substance use facility. Will consult with SW for assistance regarding dispo       Recommendations   Based on my evaluation the patient does not appear to have an emergency medical condition.  See above  Ival Bible, MD 02/05/21  3:19 PM

## 2021-02-05 NOTE — Progress Notes (Signed)
Pt is a transfer from [facility] and is admitted to [facility] room 153 due to SI with plan to jump off a bridge. Pt verbally contracts for safety on the unit. Pt is alert and oriented. Pt is ambulatory and is oriented to staff/unit. Pt was cooperative with skin assessment and admission process. Pt denies HI/AVH at this time. Staff will monitor for pt's safety.

## 2021-02-05 NOTE — ED Provider Notes (Signed)
[facility] Admission Suicide Risk Assessment   Nursing information obtained from:   chart On Admission:   denies current SI but admits that he felt suicidal on presentation to [facility] ED Demographic Factors:  Male, Divorced or widowed, Low socioeconomic status, and Unemployed  Loss Factors: Financial problems/change in socioeconomic status  Historical Factors: Prior suicide attempts, Family history of mental illness or substance abuse, and Impulsivity  Risk Reduction Factors:   Sense of responsibility to family, Living with another person, especially a relative, and Positive social support   Total Time spent with patient: 45 minutes Principal Problem: <principal problem not specified> Diagnosis:  Active Problems:   MDD (major depressive disorder), recurrent episode, severe ([place])  Subjective Data:   55 yo male with a history of depression, substance use who presents as a transfer from [facility] ED to [facility] for continued care. He presented to [facility] ED on 8/31 with SI and substance use.  On chart review, he had reported being depressed for years due to his wifes passing 18 yrs ago. He expressed SI with a plan to jump off a bridge in [place]. Reported regular use of MJ, etoh, crack cocaine. BAL 175 on presentation. UDS+cocaine, THC.   On interview upon arrival at the [facility], patient is calm, cooperative and pleasant. He indicates his reason for presenting to the [facility] ED as above. He goes on to say that he states that he has been "stressed for many years" and reports having 7 kids and 24 grandkids. He states that his wife passed away 18 years ago and that his partner of 14 years passed away within the last ~3 years. Patient states that as a result of these stressors "I turn to alcohol". His most recent stressor he identifies as his living situation. He states that he has been that he has been living with his son who has 2 young children and that his son "is not patient" with the children and that there is now DSS  involvement.   He reports drinking 2-3 40 oz beers a day for the last 2.5 months; last drink was yesterday. He denies sx of alcohol withdrawal including tremors, headache, diaphoresis, GI upset although does report some heart burn. He denies h/o alcohol withdrawal or seizures in the passt. He states that prior to 2.5 months ago, he had been sober for about 7 months. He attributes his ablity to stay sober during this time to being around his mother and daughter while dealing with a family crisis.  He reports using marijuana"every other day"; about 5-10 dollars worth; denies synthetic marijuana use. He reports using crack cocaine within the last 1-2 months, prior to this he states that he had been sover from crack cocaine for approximately 1 year.    He recalls previous hospitalization at [facility] and states that gabapentin and lexapro were helpful although he had difficulty with medication compliance after discharge. He describes his mood as "in a slump";  rates it 5/10 (10 being the best) and reports poor sleep (3-4 hours a night), hopelessness, low appetite, poor concentration. Denies anhedonia. Denies SI plan or intent on interview and states that he is looking forward to get help. He denies HI/AVH  Continued Clinical Symptoms:    The "Alcohol Use Disorders Identification Test", Guidelines for Use in Primary Care, Second Edition.  World Pharmacologist [facility]). Score between 0-7:  no or low risk or alcohol related problems. Score between 8-15:  moderate risk of alcohol related problems. Score between 16-19:  high  risk of alcohol related problems. Score 20 or above:  warrants further diagnostic evaluation for alcohol dependence and treatment.   CLINICAL FACTORS:   Depression:   Hopelessness Insomnia Alcohol/Substance Abuse/Dependencies Previous Psychiatric Diagnoses and Treatments   Musculoskeletal: Strength & Muscle Tone: within normal limits Gait & Station: normal Patient leans:  N/A  Psychiatric Specialty Exam:  Presentation  General Appearance: Appropriate for Environment; Casual  Eye Contact:Good  Speech:Clear and Coherent; Normal Rate  Speech Volume:Normal  Handedness:Right   Mood and Affect  Mood:Euthymic  Affect:Appropriate; Congruent   Thought Process  Thought Processes:Coherent; Goal Directed; Linear  Descriptions of Associations:Intact  Orientation:Full (Time, Place and Person)  Thought Content:WDL; Logical  History of Schizophrenia/Schizoaffective disorder:No  Duration of Psychotic Symptoms:N/A  Hallucinations:Hallucinations: None  Ideas of Reference:None  Suicidal Thoughts:Suicidal Thoughts: No   Homicidal Thoughts:Homicidal Thoughts: No   Sensorium  Memory:Immediate Good; Recent Good; Remote Good  Judgment:Fair  Insight:Fair   Executive Functions  Concentration:Good  Attention Span:Good  [place] of Knowledge:Good  Language:Good   Psychomotor Activity  Psychomotor Activity:Psychomotor Activity: Normal   Assets  Assets:Communication Skills; Desire for Improvement; Financial Resources/Insurance; Resilience; Social Support   Sleep  Sleep:Sleep: Poor    Physical Exam: See H&P for physical exam and ROS   Blood pressure 110/78, pulse (!) 48, temperature 97.7 F (36.5 C), temperature source Tympanic, resp. rate 18, SpO2 100 %. There is no height or weight on file to calculate BMI.   COGNITIVE FEATURES THAT CONTRIBUTE TO RISK:  Thought constriction (tunnel vision)    SUICIDE RISK:   Moderate:  Frequent suicidal ideation with limited intensity, and duration, some specificity in terms of plans, no associated intent, good self-control, limited dysphoria/symptomatology, some risk factors present, and identifiable protective factors, including available and accessible social support.  PLAN OF CARE:   55 yo male with history of AUD and MDD who presents to the [facility] as a transfer from [facility] ED for  SI. Reviewed labs- CBC, CMP, UA unremarkable. TSH from May 2022 1.337 (wnl). UDS+THC, cocaine. BAL 175 on presentation to the ED. On interview at the [facility] he reports feeling suicidal upon presentation to [facility] but denies currently. He reports numerous depressive sx as well as substance abuse and expresses  interest in rehab. He is amenable to restart gabapentin for AUD and lexapro for mood at doses he was previously discharged on from [facility]. Pt reported drinking 2-3 40 ox beer daily for ~2.5 months, he denies h/o etoh withdrawal and is not currently reporting sx of etoh withdrawal, but will place on detox protocol with CIWA out of caution. Patient is appropriate for treatment at the [facility] at this time  MDD -re-start lexapro 10 mg  AUD -re-start gabapentin 300 mg TID for etoh withdrawal  -detox protocol- ativan 1 mg q6 hrs prn CIWA>10 -pt expresses interest in substance [place] treatment  Cocaine use disorder Cannabis use disorder -will counsel on cessation -pt expresses interest in substance use treatment  Nicotine dependence -PRN nicorette gum for nicotine cravings  Dispo: ongoing. Likely discharge to inpatient rehab/substance use facility. Will consult with SW for assistance regarding dispo  I certify that inpatient services furnished can reasonably be expected to improve the patient's condition.   Ival Bible, MD 02/05/2021, 3:26 PM

## 2021-02-05 NOTE — Consult Note (Signed)
[facility] ED Progress Note  02/05/2021 2:56 PM [name] [name]  MRN:  [id number]   Method of visit?: Face to Face   Subjective: per admit note:   Patient is a 55 year old male with past medical history of GERD, coronary artery disease, and polysubstance abuse.  Patient presenting today with complaints of suicidal ideation.  Patient reports multiple issues causing him stress and reports thoughts of jumping off of an overpass or building.  He describes "self-medicating" with crack cocaine, alcohol.  Today,  Patient reports that he is stressed out and describes a situation where he was at his son's house drinking on the porch, and his son came out and was upset because the baby was sleeping. Unclear of details that caused the conflict. He is lying calmly in the Ed on a stretcher, with fair eye contact, cooperative with exam.   Endorses suicidal ideation without plan, reports "I can't deal with it" Denies homicidal ideation, denies auditory and visual hallucinations. Uses marijuana and drinks beer, had 4 40 ounce beers yesterday. Patient is homeless after incident at son's house. Does not take medications for psychiatric problems, although it sounds like he is supposed to be on medications.   Discussed options for treatment, he is appropriate for the facility based crisis unit.    Principal Problem: Suicidal ideation Diagnosis:  Principal Problem:   Suicidal ideation  Total Time spent with patient: 30 minutes  Past Psychiatric History:   Past Medical History:  Past Medical History:  Diagnosis Date   Abnormal ECG    a. early repolarization   Arthritis    "my whole left side" (05/03/2014)   Bipolar disorder (HCC)    Bradycardia    a. asymptomatic   CAD in native artery    a. Nonobstructive cath 11/2007;  b. Presented with ST elevation - Nonobstructive cath 08/2011   Chest pain, mid sternal    Coronary artery disease    GERD (gastroesophageal reflux disease)    History of cocaine abuse  ([place])    a. quit ? 2009   History of ETOH abuse    a. drinks 2 "40's" / wk   Marijuana abuse    a. uses ~ 1x /wk or less   Pneumonia    Stomach ulcer    Syncope    a. 12/2010 - presumed to be vasovagal   Tobacco abuse     Past Surgical History:  Procedure Laterality Date   CARDIAC CATHETERIZATION  2009; 08/2011   [name] 08/06/2011   LEFT HEART CATHETERIZATION WITH CORONARY ANGIOGRAM N/A 08/16/2011   Procedure: LEFT HEART CATHETERIZATION WITH CORONARY ANGIOGRAM;  Surgeon: [name], MD;  Location: [facility] CATH LAB;  Service: Cardiovascular;  Laterality: N/A;   Family History: History reviewed. No pertinent family history. Family Psychiatric  History:  Social History:  Social History   Substance and Sexual Activity  Alcohol Use Yes   Alcohol/week: 48.0 standard drinks   Types: 48 Cans of beer per week   Comment: 2 shot bottles yesterday     Social History   Substance and Sexual Activity  Drug Use Yes   Types: Marijuana, Cocaine   Comment: Crack one year ago and marijuana was yesterday    Social History   Socioeconomic History   Marital status: Widowed    Spouse name: Not on file   Number of children: 7   Years of education: Not on file   Highest education level: 10th grade  Occupational History   Occupation: unemployed  Tobacco Use   Smoking status: Some Days    Packs/day: 0.25    Years: 2.00    Pack years: 0.50    Types: Cigarettes    Last attempt to quit: 06/24/2015    Years since quitting: 5.6   Smokeless tobacco: Never  Vaping Use   Vaping Use: Never used  Substance and Sexual Activity   Alcohol use: Yes    Alcohol/week: 48.0 standard drinks    Types: 48 Cans of beer per week    Comment: 2 shot bottles yesterday   Drug use: Yes    Types: Marijuana, Cocaine    Comment: Crack one year ago and marijuana was yesterday   Sexual activity: Not Currently  Other Topics Concern   Not on file  Social History Narrative   Lives with his daughter currently and  receives SSI.    Social Determinants of Health   Financial Resource Strain: Not on file  Food Insecurity: Not on file  Transportation Needs: Not on file  Physical Activity: Not on file  Stress: Not on file  Social Connections: Not on file    Sleep: Good  Appetite:  Good  Current Medications: No current facility-administered medications for this encounter.   No current outpatient medications on file.   Facility-Administered Medications Ordered in Other Encounters  Medication Dose Route Frequency Provider Last Rate Last Admin   acetaminophen (TYLENOL) tablet 650 mg  650 mg Oral Q6H PRN Ival Bible, MD       alum & mag hydroxide-simeth (MAALOX/MYLANTA) 200-200-20 MG/5ML suspension 30 mL  30 mL Oral Q4H PRN Ival Bible, MD       escitalopram (LEXAPRO) tablet 10 mg  10 mg Oral Daily Ival Bible, MD       gabapentin (NEURONTIN) capsule 300 mg  300 mg Oral TID Ival Bible, MD       hydrOXYzine (ATARAX/VISTARIL) tablet 25 mg  25 mg Oral Q6H PRN Ival Bible, MD       loperamide (IMODIUM) capsule 2-4 mg  2-4 mg Oral PRN Ival Bible, MD       LORazepam (ATIVAN) tablet 1 mg  1 mg Oral Q6H PRN Ival Bible, MD       magnesium hydroxide (MILK OF MAGNESIA) suspension 30 mL  30 mL Oral Daily PRN Ival Bible, MD       multivitamin with minerals tablet 1 tablet  1 tablet Oral Daily Ival Bible, MD       nicotine polacrilex (NICORETTE) gum 2 mg  2 mg Oral PRN Ival Bible, MD       ondansetron (ZOFRAN-ODT) disintegrating tablet 4 mg  4 mg Oral Q6H PRN Ival Bible, MD       pantoprazole (PROTONIX) EC tablet 40 mg  40 mg Oral Daily Ival Bible, MD       [START ON 02/06/2021] thiamine tablet 100 mg  100 mg Oral Daily Ival Bible, MD       traZODone (DESYREL) tablet 50 mg  50 mg Oral QHS PRN Ival Bible, MD        Lab Results:  Results for orders placed or performed during the  hospital encounter of 02/04/21 (from the past 48 hour(s))  CBC with Differential     Status: None   Collection Time: 02/04/21  6:17 AM  Result Value Ref Range   WBC 5.7 4.0 - 10.5 K/uL   RBC 4.69 4.22 - 5.81 MIL/uL  Hemoglobin 14.6 13.0 - 17.0 g/dL   HCT 42.2 39.0 - 52.0 %   MCV 90.0 80.0 - 100.0 fL   MCH 31.1 26.0 - 34.0 pg   MCHC 34.6 30.0 - 36.0 g/dL   RDW 13.6 11.5 - 15.5 %   Platelets 275 150 - 400 K/uL   nRBC 0.0 0.0 - 0.2 %   Neutrophils Relative % 61 %   Neutro Abs 3.5 1.7 - 7.7 K/uL   Lymphocytes Relative 28 %   Lymphs Abs 1.6 0.7 - 4.0 K/uL   Monocytes Relative 9 %   Monocytes Absolute 0.5 0.1 - 1.0 K/uL   Eosinophils Relative 1 %   Eosinophils Absolute 0.1 0.0 - 0.5 K/uL   Basophils Relative 1 %   Basophils Absolute 0.0 0.0 - 0.1 K/uL   Immature Granulocytes 0 %   Abs Immature Granulocytes 0.01 0.00 - 0.07 K/uL    Comment: Performed at [facility], [place] [address], [place], [place] [address]  Comprehensive metabolic panel     Status: None   Collection Time: 02/04/21  6:17 AM  Result Value Ref Range   Sodium 144 135 - 145 mmol/L   Potassium 4.0 3.5 - 5.1 mmol/L   Chloride 110 98 - 111 mmol/L   CO2 23 22 - 32 mmol/L   Glucose, Bld 86 70 - 99 mg/dL    Comment: Glucose reference range applies only to samples taken after fasting for at least 8 hours.   BUN 10 6 - 20 mg/dL   Creatinine, Ser 1.02 0.61 - 1.24 mg/dL   Calcium 9.0 8.9 - 10.3 mg/dL   Total Protein 8.1 6.5 - 8.1 g/dL   Albumin 4.5 3.5 - 5.0 g/dL   AST 29 15 - 41 U/L   ALT 25 0 - 44 U/L   Alkaline Phosphatase 76 38 - 126 U/L   Total Bilirubin 0.6 0.3 - 1.2 mg/dL   GFR, Estimated >60 >60 mL/min    Comment: (NOTE) Calculated using the CKD-EPI Creatinine Equation (2021)    Anion gap 11 5 - 15    Comment: Performed at [facility] [place] [address] [place], [place] [address]  Ethanol     Status: Abnormal   Collection Time: 02/04/21  6:17 AM  Result Value Ref Range    Alcohol, Ethyl (B) 175 (H) <10 mg/dL    Comment: (NOTE) Lowest detectable limit for serum alcohol is 10 mg/dL.  For medical purposes only. Performed at [facility], [place] [address], [place], [name] [address]   Acetaminophen level     Status: Abnormal   Collection Time: 02/04/21  6:17 AM  Result Value Ref Range   Acetaminophen (Tylenol), Serum <10 (L) 10 - 30 ug/mL    Comment: (NOTE) Therapeutic concentrations vary significantly. A range of 10-30 ug/mL  may be an effective concentration for many patients. However, some  are best treated at concentrations outside of this range. Acetaminophen concentrations >150 ug/mL at 4 hours after ingestion  and >50 ug/mL at 12 hours after ingestion are often associated with  toxic reactions.  Performed at [facility], [place] [address] [place], [place] [address]   Rapid urine drug screen (hospital performed)     Status: Abnormal   Collection Time: 02/04/21  6:18 AM  Result Value Ref Range   Opiates NONE DETECTED NONE DETECTED   Cocaine POSITIVE (A) NONE DETECTED   Benzodiazepines NONE DETECTED NONE DETECTED   Amphetamines NONE DETECTED NONE  DETECTED   Tetrahydrocannabinol POSITIVE (A) NONE DETECTED   Barbiturates NONE DETECTED NONE DETECTED    Comment: (NOTE) DRUG SCREEN FOR MEDICAL PURPOSES ONLY.  IF CONFIRMATION IS NEEDED FOR ANY PURPOSE, NOTIFY LAB WITHIN 5 DAYS.  LOWEST DETECTABLE LIMITS FOR URINE DRUG SCREEN Drug Class                     Cutoff (ng/mL) Amphetamine and metabolites    1000 Barbiturate and metabolites    200 Benzodiazepine                 [id number] Tricyclics and metabolites     300 Opiates and metabolites        300 Cocaine and metabolites        300 THC                            50 Performed at [facility], [place] [address], [place], [place] [address]   Urinalysis, Routine w reflex microscopic     Status: None   Collection Time: 02/04/21  6:18 AM  Result  Value Ref Range   Color, Urine YELLOW YELLOW   APPearance CLEAR CLEAR   Specific Gravity, Urine 1.020 1.005 - 1.030   pH 6.0 5.0 - 8.0   Glucose, UA NEGATIVE NEGATIVE mg/dL   Hgb urine dipstick NEGATIVE NEGATIVE   Bilirubin Urine NEGATIVE NEGATIVE   Ketones, ur NEGATIVE NEGATIVE mg/dL   Protein, ur NEGATIVE NEGATIVE mg/dL   Nitrite NEGATIVE NEGATIVE   Leukocytes,Ua NEGATIVE NEGATIVE    Comment: Microscopic not done on urines with negative protein, blood, leukocytes, nitrite, or glucose < 500 mg/dL. Performed at [facility], [place] [address], [place], [place] [address]   Resp Panel by RT-PCR (Flu A&B, Covid) Nasopharyngeal Swab     Status: None   Collection Time: 02/04/21  6:42 AM   Specimen: Nasopharyngeal Swab; Nasopharyngeal(NP) swabs in vial transport medium  Result Value Ref Range   SARS Coronavirus 2 by RT PCR NEGATIVE NEGATIVE    Comment: (NOTE) SARS-CoV-2 target nucleic acids are NOT DETECTED.  The SARS-CoV-2 RNA is generally detectable in upper respiratory specimens during the acute phase of infection. The lowest concentration of SARS-CoV-2 viral copies this assay can detect is 138 copies/mL. A negative result does not preclude SARS-Cov-2 infection and should not be used as the sole basis for treatment or other patient management decisions. A negative result may occur with  improper specimen collection/handling, submission of specimen other than nasopharyngeal swab, presence of viral mutation(s) within the areas targeted by this assay, and inadequate number of viral copies(<138 copies/mL). A negative result must be combined with clinical observations, patient history, and epidemiological information. The expected result is Negative.  Fact Sheet for Patients:  EntrepreneurPulse.com.au  Fact Sheet for Healthcare Providers:  IncredibleEmployment.be  This test is no t yet approved or cleared by the [place] FDA  and  has been authorized for detection and/or diagnosis of SARS-CoV-2 by FDA under an Emergency Use Authorization (EUA). This EUA will remain  in effect (meaning this test can be used) for the duration of the COVID-19 declaration under Section 564(b)(1) of the Act, 21 U.S.C.section 360bbb-3(b)(1), unless the authorization is terminated  or revoked sooner.       Influenza A by PCR NEGATIVE NEGATIVE   Influenza B by PCR NEGATIVE NEGATIVE    Comment: (NOTE) The Xpert Xpress SARS-CoV-2/FLU/RSV plus assay is intended as  an aid in the diagnosis of influenza from Nasopharyngeal swab specimens and should not be used as a sole basis for treatment. Nasal washings and aspirates are unacceptable for Xpert Xpress SARS-CoV-2/FLU/RSV testing.  Fact Sheet for Patients: EntrepreneurPulse.com.au  Fact Sheet for Healthcare Providers: IncredibleEmployment.be  This test is not yet approved or cleared by the [place] FDA and has been authorized for detection and/or diagnosis of SARS-CoV-2 by FDA under an Emergency Use Authorization (EUA). This EUA will remain in effect (meaning this test can be used) for the duration of the COVID-19 declaration under Section 564(b)(1) of the Act, 21 U.S.C. section 360bbb-3(b)(1), unless the authorization is terminated or revoked.  Performed at [facility], [place] [address], [place], [place] [address]     Blood Alcohol level:  Lab Results  Component Value Date   ETH 175 (H) 02/04/2021   ETH 150 (H) 05/13/2020    Physical Findings: AIMS:  , ,  ,  ,    CIWA:    COWS:     Musculoskeletal: Strength & Muscle Tone: within normal limits Gait & Station: normal Patient leans: N/A  Psychiatric Specialty Exam:  Presentation  General Appearance: Appropriate for Environment; Casual  Eye Contact:Good  Speech:Clear and Coherent; Normal Rate  Speech Volume:Normal  Handedness:Right   Mood and  Affect  Mood:Euthymic  Affect:Appropriate; Congruent   Thought Process  Thought Processes:Coherent; Goal Directed; Linear  Descriptions of Associations:Intact  Orientation:Full (Time, Place and Person)  Thought Content:WDL; Logical  History of Schizophrenia/Schizoaffective disorder:No  Duration of Psychotic Symptoms:N/A  Hallucinations:Hallucinations: None Ideas of Reference:None  Suicidal Thoughts:Suicidal Thoughts: No SI Passive Intent and/or Plan: With Intent; Without Plan Homicidal Thoughts:Homicidal Thoughts: No  Sensorium  Memory:Immediate Good; Recent Good; Remote Good  Judgment:Fair  Insight:Fair   Executive Functions  Concentration:Good  Attention Span:Good  Recall:Good  Fund of Knowledge:Good  Language:Good   Psychomotor Activity  Psychomotor Activity: Psychomotor Activity: Normal  Assets  Assets:Communication Skills; Desire for Improvement; Financial Resources/Insurance; Resilience; Social Support   Sleep  Sleep: Sleep: Poor   Physical Exam: Physical Exam Vitals reviewed.  Cardiovascular:     Rate and Rhythm: Bradycardia present.  Pulmonary:     Effort: Pulmonary effort is normal.  Skin:    General: Skin is warm and dry.  Neurological:     Mental Status: He is alert and oriented to person, place, and time.  Psychiatric:        Attention and Perception: Attention normal.        Mood and Affect: Mood is anxious and depressed.        Speech: Speech normal.        Behavior: Behavior is cooperative.        Thought Content: Thought content is not paranoid or delusional. Thought content includes suicidal ideation. Thought content does not include homicidal ideation. Thought content includes suicidal ("I can't deal with it") plan. Thought content does not include homicidal plan.   Review of Systems  Constitutional:  Negative for chills and fever.  Respiratory:  Negative for shortness of breath.   Cardiovascular:  Negative for chest  pain.  Gastrointestinal:  Negative for abdominal pain.  Neurological:  Negative for headaches.  Psychiatric/Behavioral:  Positive for depression, substance abuse and suicidal ideas. Negative for hallucinations. The patient is nervous/anxious. The patient does not have insomnia.   Blood pressure 115/72, pulse 68, temperature 98.1 F (36.7 C), resp. rate 18, SpO2 99 %. There is no height or weight on file to calculate BMI.  Treatment Plan Summary: Daily contact with patient to assess and evaluate symptoms and progress in treatment, Medication management, and Plan Meets criteria for facility based crisis unit. Dr. Serafina Mitchell accepts patient. Patient agrees to plan and is voluntary. Will go via safe transport.   Chalmers Guest, NP 02/05/2021, 2:56 PM

## 2021-02-05 NOTE — BH Assessment (Signed)
[facility] Assessment Progress Note   Per Pecolia Ades, NP, pt is to be transferred to the [facility] to the service of Dr Serafina Mitchell.  Pt has signed Voluntary Admission and Consent for Treatment, as well as Consent to Release information to no one, and forms have been faxed to [facility].  Please call report to [phone].  Pt is to be transported via [facility].  EDP Lennice Sites, DO and pt's nurse, Eugene Garnet, have been notified.  Jalene Mullet, [place] Coordinator [phone]

## 2021-02-06 ENCOUNTER — Encounter (HOSPITAL_COMMUNITY): Payer: Self-pay | Admitting: Psychiatry

## 2021-02-06 DIAGNOSIS — F319 Bipolar disorder, unspecified: Secondary | ICD-10-CM | POA: Diagnosis not present

## 2021-02-06 DIAGNOSIS — F1410 Cocaine abuse, uncomplicated: Secondary | ICD-10-CM | POA: Diagnosis not present

## 2021-02-06 DIAGNOSIS — F1210 Cannabis abuse, uncomplicated: Secondary | ICD-10-CM | POA: Diagnosis not present

## 2021-02-06 DIAGNOSIS — F1020 Alcohol dependence, uncomplicated: Secondary | ICD-10-CM | POA: Diagnosis not present

## 2021-02-06 NOTE — Progress Notes (Signed)
Pt is asleep. Respirations are even and unlabored. No signs of acute distress noted. Staff will monitor for pt's safety. 

## 2021-02-06 NOTE — ED Notes (Signed)
Pt given lunch

## 2021-02-06 NOTE — ED Notes (Addendum)
Staff asked the patient to explain why they are here. Staff asked the patient what they will do different to change their situation. Staffed encouraged the patient to stay focused. Staff continued to monitor the patient.  Patient said that he has a problem with drinking alcohol. Patient explained that he had stopped for 7 months and knows he can do it again. Patient explained that he can start being around more positive people such as his parents and that will help him stay sober.

## 2021-02-06 NOTE — ED Provider Notes (Addendum)
Behavioral Health Progress Note  Date and Time: 02/06/2021 1:36 PM Name: [name] [name] MRN:  [id number]  Subjective:   Patient seen and chart reviewed. He is interviewed in his room this AM, he is calm , cooperative and pleasant. He is tolerating medications well without SE. Most recent CIWA score available is from yesterday afternoon with a score of 1. NO PRNs received for alcohol withdrawal during his stay. He describes his mood as "recharged in a way" and appears brighter. He denies SI/HI/AVH. He states that he attended groups yesterday and found the AA group particularly helpful. He denies alcohol withdrawal sx of HA, tremors, GI upset, diaphoresis, vomiting. He reports some nausea this AM but describes this as chronic; he says he was able to eat his breakfast without issue. He has several questions about housing assistance and remains interested in rehab.   Diagnosis:  Final diagnoses:  Severe episode of recurrent major depressive disorder, without psychotic features (HCC)  Alcohol use disorder, severe, dependence ([place])  Cannabis abuse  Cocaine abuse ([place])    Total Time spent with patient: 20 minutes  Past Psychiatric History: as per H&P Past Medical History:  Past Medical History:  Diagnosis Date   Abnormal ECG    a. early repolarization   Arthritis    "my whole left side" (05/03/2014)   Bipolar disorder ([place])    Bradycardia    a. asymptomatic   CAD in native artery    a. Nonobstructive cath 11/2007;  b. Presented with ST elevation - Nonobstructive cath 08/2011   Chest pain, mid sternal    Coronary artery disease    GERD (gastroesophageal reflux disease)    History of cocaine abuse ([place])    a. quit ? 2009   History of ETOH abuse    a. drinks 2 "40's" / wk   Marijuana abuse    a. uses ~ 1x /wk or less   Pneumonia    Stomach ulcer    Syncope    a. 12/2010 - presumed to be vasovagal   Tobacco abuse     Past Surgical History:  Procedure Laterality Date   CARDIAC  CATHETERIZATION  2009; 08/2011   [name] 08/06/2011   LEFT HEART CATHETERIZATION WITH CORONARY ANGIOGRAM N/A 08/16/2011   Procedure: LEFT HEART CATHETERIZATION WITH CORONARY ANGIOGRAM;  Surgeon: [name], MD;  Location: [facility] CATH LAB;  Service: Cardiovascular;  Laterality: N/A;   Family History: No family history on file. Family Psychiatric  History: as per H&P Social History:  Social History   Substance and Sexual Activity  Alcohol Use Yes   Alcohol/week: 48.0 standard drinks   Types: 48 Cans of beer per week   Comment: 2 shot bottles yesterday     Social History   Substance and Sexual Activity  Drug Use Yes   Types: Marijuana, Cocaine   Comment: Crack one year ago and marijuana was yesterday    Social History   Socioeconomic History   Marital status: Widowed    Spouse name: Not on file   Number of children: 7   Years of education: Not on file   Highest education level: 10th grade  Occupational History   Occupation: unemployed  Tobacco Use   Smoking status: Some Days    Packs/day: 0.25    Years: 2.00    Pack years: 0.50    Types: Cigarettes    Last attempt to quit: 06/24/2015    Years since quitting: 5.6   Smokeless tobacco: Never  Vaping Use  Vaping Use: Never used  Substance and Sexual Activity   Alcohol use: Yes    Alcohol/week: 48.0 standard drinks    Types: 48 Cans of beer per week    Comment: 2 shot bottles yesterday   Drug use: Yes    Types: Marijuana, Cocaine    Comment: Crack one year ago and marijuana was yesterday   Sexual activity: Not Currently  Other Topics Concern   Not on file  Social History Narrative   Lives with his daughter currently and receives SSI.    Social Determinants of Health   Financial Resource Strain: Not on file  Food Insecurity: Not on file  Transportation Needs: Not on file  Physical Activity: Not on file  Stress: Not on file  Social Connections: Not on file   SDOH:  SDOH Screenings   Alcohol Screen: Not on  file  Depression (PHQ2-9): Medium Risk   PHQ-2 Score: 13  Financial Resource Strain: Not on file  Food Insecurity: Not on file  Housing: Not on file  Physical Activity: Not on file  Social Connections: Not on file  Stress: Not on file  Tobacco Use: High Risk   Smoking Tobacco Use: Some Days   Smokeless Tobacco Use: Never  Transportation Needs: Not on file   Additional Social History:                         Sleep: Fair  Appetite:  Fair  Current Medications:  Current Facility-Administered Medications  Medication Dose Route Frequency Provider Last Rate Last Admin   acetaminophen (TYLENOL) tablet 650 mg  650 mg Oral Q6H PRN Ival Bible, MD       alum & mag hydroxide-simeth (MAALOX/MYLANTA) 200-200-20 MG/5ML suspension 30 mL  30 mL Oral Q4H PRN Ival Bible, MD       escitalopram (LEXAPRO) tablet 10 mg  10 mg Oral Daily Ival Bible, MD   10 mg at 02/06/21 [id number]   gabapentin (NEURONTIN) capsule 300 mg  300 mg Oral TID Ival Bible, MD   300 mg at 02/06/21 [id number]   hydrOXYzine (ATARAX/VISTARIL) tablet 25 mg  25 mg Oral Q6H PRN Ival Bible, MD       loperamide (IMODIUM) capsule 2-4 mg  2-4 mg Oral PRN Ival Bible, MD       LORazepam (ATIVAN) tablet 1 mg  1 mg Oral Q6H PRN Ival Bible, MD       magnesium hydroxide (MILK OF MAGNESIA) suspension 30 mL  30 mL Oral Daily PRN Ival Bible, MD       multivitamin with minerals tablet 1 tablet  1 tablet Oral Daily Ival Bible, MD   1 tablet at 02/06/21 [id number]   nicotine polacrilex (NICORETTE) gum 2 mg  2 mg Oral PRN Ival Bible, MD       ondansetron (ZOFRAN-ODT) disintegrating tablet 4 mg  4 mg Oral Q6H PRN Ival Bible, MD       pantoprazole (PROTONIX) EC tablet 40 mg  40 mg Oral Daily Ival Bible, MD   40 mg at 02/06/21 [id number]   thiamine tablet 100 mg  100 mg Oral Daily Ival Bible, MD   100 mg at 02/06/21 [id number]   traZODone  (DESYREL) tablet 50 mg  50 mg Oral QHS PRN Ival Bible, MD   50 mg at 02/05/21 2152   No current outpatient medications on file.    Labs  Lab Results:  Admission on 02/04/2021, Discharged on 02/05/2021  Component Date Value Ref Range Status   WBC 02/04/2021 5.7  4.0 - 10.5 K/uL Final   RBC 02/04/2021 4.69  4.22 - 5.81 MIL/uL Final   Hemoglobin 02/04/2021 14.6  13.0 - 17.0 g/dL Final   HCT 02/04/2021 42.2  39.0 - 52.0 % Final   MCV 02/04/2021 90.0  80.0 - 100.0 fL Final   MCH 02/04/2021 31.1  26.0 - 34.0 pg Final   MCHC 02/04/2021 34.6  30.0 - 36.0 g/dL Final   RDW 02/04/2021 13.6  11.5 - 15.5 % Final   Platelets 02/04/2021 275  150 - 400 K/uL Final   nRBC 02/04/2021 0.0  0.0 - 0.2 % Final   Neutrophils Relative % 02/04/2021 61  % Final   Neutro Abs 02/04/2021 3.5  1.7 - 7.7 K/uL Final   Lymphocytes Relative 02/04/2021 28  % Final   Lymphs Abs 02/04/2021 1.6  0.7 - 4.0 K/uL Final   Monocytes Relative 02/04/2021 9  % Final   Monocytes Absolute 02/04/2021 0.5  0.1 - 1.0 K/uL Final   Eosinophils Relative 02/04/2021 1  % Final   Eosinophils Absolute 02/04/2021 0.1  0.0 - 0.5 K/uL Final   Basophils Relative 02/04/2021 1  % Final   Basophils Absolute 02/04/2021 0.0  0.0 - 0.1 K/uL Final   Immature Granulocytes 02/04/2021 0  % Final   Abs Immature Granulocytes 02/04/2021 0.01  0.00 - 0.07 K/uL Final   Performed at [facility], [place] [address], [place], [name] [address]   Sodium 02/04/2021 144  135 - 145 mmol/L Final   Potassium 02/04/2021 4.0  3.5 - 5.1 mmol/L Final   Chloride 02/04/2021 110  98 - 111 mmol/L Final   CO2 02/04/2021 23  22 - 32 mmol/L Final   Glucose, Bld 02/04/2021 86  70 - 99 mg/dL Final   Glucose reference range applies only to samples taken after fasting for at least 8 hours.   BUN 02/04/2021 10  6 - 20 mg/dL Final   Creatinine, Ser 02/04/2021 1.02  0.61 - 1.24 mg/dL Final   Calcium 02/04/2021 9.0  8.9 - 10.3 mg/dL Final   Total Protein  02/04/2021 8.1  6.5 - 8.1 g/dL Final   Albumin 02/04/2021 4.5  3.5 - 5.0 g/dL Final   AST 02/04/2021 29  15 - 41 U/L Final   ALT 02/04/2021 25  0 - 44 U/L Final   Alkaline Phosphatase 02/04/2021 76  38 - 126 U/L Final   Total Bilirubin 02/04/2021 0.6  0.3 - 1.2 mg/dL Final   GFR, Estimated 02/04/2021 >60  >60 mL/min Final   Comment: (NOTE) Calculated using the CKD-EPI Creatinine Equation (2021)    Anion gap 02/04/2021 11  5 - 15 Final   Performed at [facility], [place] [address] [place], [name] [address]   Alcohol, Ethyl (B) 02/04/2021 175 (A) <10 mg/dL Final   Comment: (NOTE) Lowest detectable limit for serum alcohol is 10 mg/dL.  For medical purposes only. Performed at [facility], [place] [address], [place], [name] [address]    Acetaminophen (Tylenol), Serum 02/04/2021 <10 (A) 10 - 30 ug/mL Final   Comment: (NOTE) Therapeutic concentrations vary significantly. A range of 10-30 ug/mL  may be an effective concentration for many patients. However, some  are best treated at concentrations outside of this range. Acetaminophen concentrations >150 ug/mL at 4 hours after ingestion  and >50 ug/mL at 12 hours  after ingestion are often associated with  toxic reactions.  Performed at [facility], [place] [address], [place], [place] [address]    Opiates 02/04/2021 NONE DETECTED  NONE DETECTED Final   Cocaine 02/04/2021 POSITIVE (A) NONE DETECTED Final   Benzodiazepines 02/04/2021 NONE DETECTED  NONE DETECTED Final   Amphetamines 02/04/2021 NONE DETECTED  NONE DETECTED Final   Tetrahydrocannabinol 02/04/2021 POSITIVE (A) NONE DETECTED Final   Barbiturates 02/04/2021 NONE DETECTED  NONE DETECTED Final   Comment: (NOTE) DRUG SCREEN FOR MEDICAL PURPOSES ONLY.  IF CONFIRMATION IS NEEDED FOR ANY PURPOSE, NOTIFY LAB WITHIN 5 DAYS.  LOWEST DETECTABLE LIMITS FOR URINE DRUG SCREEN Drug Class                     Cutoff  (ng/mL) Amphetamine and metabolites    1000 Barbiturate and metabolites    200 Benzodiazepine                 [id number] Tricyclics and metabolites     300 Opiates and metabolites        300 Cocaine and metabolites        300 THC                            50 Performed at [facility], [place] [address] [place], [place] [address]    Color, Urine 02/04/2021 YELLOW  YELLOW Final   APPearance 02/04/2021 CLEAR  CLEAR Final   Specific Gravity, Urine 02/04/2021 1.020  1.005 - 1.030 Final   pH 02/04/2021 6.0  5.0 - 8.0 Final   Glucose, UA 02/04/2021 NEGATIVE  NEGATIVE mg/dL Final   Hgb urine dipstick 02/04/2021 NEGATIVE  NEGATIVE Final   Bilirubin Urine 02/04/2021 NEGATIVE  NEGATIVE Final   Ketones, ur 02/04/2021 NEGATIVE  NEGATIVE mg/dL Final   Protein, ur 02/04/2021 NEGATIVE  NEGATIVE mg/dL Final   Nitrite 02/04/2021 NEGATIVE  NEGATIVE Final   Leukocytes,Ua 02/04/2021 NEGATIVE  NEGATIVE Final   Comment: Microscopic not done on urines with negative protein, blood, leukocytes, nitrite, or glucose < 500 mg/dL. Performed at [facility], [place] [address] [place], [place] [address]    SARS Coronavirus 2 by RT PCR 02/04/2021 NEGATIVE  NEGATIVE Final   Comment: (NOTE) SARS-CoV-2 target nucleic acids are NOT DETECTED.  The SARS-CoV-2 RNA is generally detectable in upper respiratory specimens during the acute phase of infection. The lowest concentration of SARS-CoV-2 viral copies this assay can detect is 138 copies/mL. A negative result does not preclude SARS-Cov-2 infection and should not be used as the sole basis for treatment or other patient management decisions. A negative result may occur with  improper specimen collection/handling, submission of specimen other than nasopharyngeal swab, presence of viral mutation(s) within the areas targeted by this assay, and inadequate number of viral copies(<138 copies/mL). A negative result must be combined with clinical  observations, patient history, and epidemiological information. The expected result is Negative.  Fact Sheet for Patients:  EntrepreneurPulse.com.au  Fact Sheet for Healthcare Providers:  IncredibleEmployment.be  This test is no                          t yet approved or cleared by the [place] FDA and  has been authorized for detection and/or diagnosis of SARS-CoV-2 by FDA under an Emergency Use Authorization (EUA). This EUA will remain  in effect (meaning this test can be  used) for the duration of the COVID-19 declaration under Section 564(b)(1) of the Act, 21 U.S.C.section 360bbb-3(b)(1), unless the authorization is terminated  or revoked sooner.       Influenza A by PCR 02/04/2021 NEGATIVE  NEGATIVE Final   Influenza B by PCR 02/04/2021 NEGATIVE  NEGATIVE Final   Comment: (NOTE) The Xpert Xpress SARS-CoV-2/FLU/RSV plus assay is intended as an aid in the diagnosis of influenza from Nasopharyngeal swab specimens and should not be used as a sole basis for treatment. Nasal washings and aspirates are unacceptable for Xpert Xpress SARS-CoV-2/FLU/RSV testing.  Fact Sheet for Patients: EntrepreneurPulse.com.au  Fact Sheet for Healthcare Providers: IncredibleEmployment.be  This test is not yet approved or cleared by the [place] FDA and has been authorized for detection and/or diagnosis of SARS-CoV-2 by FDA under an Emergency Use Authorization (EUA). This EUA will remain in effect (meaning this test can be used) for the duration of the COVID-19 declaration under Section 564(b)(1) of the Act, 21 U.S.C. section 360bbb-3(b)(1), unless the authorization is terminated or revoked.  Performed at [facility], [place] [address], [place], [place] [address]   Admission on 10/14/2020, Discharged on 10/14/2020  Component Date Value Ref Range Status   WBC 10/14/2020 4.3  4.0 - 10.5 K/uL Final    RBC 10/14/2020 4.47  4.22 - 5.81 MIL/uL Final   Hemoglobin 10/14/2020 13.5  13.0 - 17.0 g/dL Final   HCT 10/14/2020 39.7  39.0 - 52.0 % Final   MCV 10/14/2020 88.8  80.0 - 100.0 fL Final   MCH 10/14/2020 30.2  26.0 - 34.0 pg Final   MCHC 10/14/2020 34.0  30.0 - 36.0 g/dL Final   RDW 10/14/2020 13.2  11.5 - 15.5 % Final   Platelets 10/14/2020 287  150 - 400 K/uL Final   nRBC 10/14/2020 0.0  0.0 - 0.2 % Final   Neutrophils Relative % 10/14/2020 42  % Final   Neutro Abs 10/14/2020 1.8  1.7 - 7.7 K/uL Final   Lymphocytes Relative 10/14/2020 42  % Final   Lymphs Abs 10/14/2020 1.8  0.7 - 4.0 K/uL Final   Monocytes Relative 10/14/2020 11  % Final   Monocytes Absolute 10/14/2020 0.5  0.1 - 1.0 K/uL Final   Eosinophils Relative 10/14/2020 4  % Final   Eosinophils Absolute 10/14/2020 0.2  0.0 - 0.5 K/uL Final   Basophils Relative 10/14/2020 1  % Final   Basophils Absolute 10/14/2020 0.0  0.0 - 0.1 K/uL Final   Immature Granulocytes 10/14/2020 0  % Final   Abs Immature Granulocytes 10/14/2020 0.01  0.00 - 0.07 K/uL Final   Performed at [facility], [name] [address] [place], [name] [address]   Sodium 10/14/2020 134 (A) 135 - 145 mmol/L Final   Potassium 10/14/2020 4.4  3.5 - 5.1 mmol/L Final   Chloride 10/14/2020 103  98 - 111 mmol/L Final   CO2 10/14/2020 26  22 - 32 mmol/L Final   Glucose, Bld 10/14/2020 95  70 - 99 mg/dL Final   Glucose reference range applies only to samples taken after fasting for at least 8 hours.   BUN 10/14/2020 17  6 - 20 mg/dL Final   Creatinine, Ser 10/14/2020 1.19  0.61 - 1.24 mg/dL Final   Calcium 10/14/2020 9.0  8.9 - 10.3 mg/dL Final   Total Protein 10/14/2020 6.9  6.5 - 8.1 g/dL Final   Albumin 10/14/2020 3.8  3.5 - 5.0 g/dL Final   AST 10/14/2020 18  15 -  41 U/L Final   ALT 10/14/2020 14  0 - 44 U/L Final   Alkaline Phosphatase 10/14/2020 62  38 - 126 U/L Final   Total Bilirubin 10/14/2020 0.4  0.3 - 1.2 mg/dL Final   GFR, Estimated 10/14/2020 >60   >60 mL/min Final   Comment: (NOTE) Calculated using the CKD-EPI Creatinine Equation (2021)    Anion gap 10/14/2020 5  5 - 15 Final   Performed at [facility], [place] [address] [place], [name] [address]   Lactic Acid, Venous 10/14/2020 0.8  0.5 - 1.9 mmol/L Final   Performed at [place] [address] [place], [name] [address]   Lactic Acid, Venous 10/14/2020 0.9  0.5 - 1.9 mmol/L Final   Performed at [place] [address], [place], [name] [address]   Lipase 10/14/2020 60 (A) 11 - 51 U/L Final   Performed at [place] [address] [place], [place] [address]   Troponin I (High Sensitivity) 10/14/2020 <2  <18 ng/L Final   Comment: (NOTE) Elevated high sensitivity troponin I (hsTnI) values and significant  changes across serial measurements may suggest ACS but many other  chronic and acute conditions are known to elevate hsTnI results.  Refer to the "Links" section for chest pain algorithms and additional  guidance. Performed at [facility], [place] [address], [place], [place] [address]    Magnesium 10/14/2020 2.0  1.7 - 2.4 mg/dL Final   Performed at [place] [address] [place], [place] [address]   TSH 10/14/2020 1.337  0.350 - 4.500 uIU/mL Final   Comment: Performed by a 3rd Generation assay with a functional sensitivity of <=0.01 uIU/mL. Performed at [facility], [place] [address], [place], [place] [address]    Troponin I (High Sensitivity) 10/14/2020 <2  <18 ng/L Final   Comment: (NOTE) Elevated high sensitivity troponin I (hsTnI) values and significant  changes across serial measurements may suggest ACS but many other  chronic and acute conditions are known to elevate hsTnI results.  Refer to the "Links" section for chest pain algorithms and additional  guidance. Performed at [facility], [place] [address], [place], [place] [address]     Blood Alcohol level:  Lab Results  Component Value Date   ETH  175 (H) 02/04/2021   ETH 150 (H) 99991111    Metabolic Disorder Labs: Lab Results  Component Value Date   HGBA1C 4.9 05/13/2020   MPG 93.93 05/13/2020   MPG 91.06 07/19/2019   No results found for: PROLACTIN Lab Results  Component Value Date   CHOL 160 05/13/2020   TRIG 184 (H) 05/13/2020   HDL 67 05/13/2020   CHOLHDL 2.4 05/13/2020   VLDL 37 05/13/2020   LDLCALC 56 05/13/2020   LDLCALC 98 03/25/2019    Therapeutic Lab Levels: No results found for: LITHIUM No results found for: VALPROATE No components found for:  CBMZ  Physical Findings   AIMS    Flowsheet Row Admission (Discharged) from 07/19/2019 in [facility] [facility]B Admission (Discharged) from 03/24/2019 in [place] [facility] Admission (Discharged) from 03/23/2019 in [place] Admission (Discharged) from 11/26/2018 in [facility] Admission (Discharged) from 02/08/2018 in [facility] 300B  AIMS Total Score 0 0 0 0 0      AUDIT    Flowsheet Row Admission (Discharged) from 07/19/2019 in [place]  [place] 300[facility] Admission (Discharged) from 03/23/2019 in [place] Admission (Discharged) from 11/26/2018 in [place] 300B Admission (Discharged) from 02/08/2018 in [place] 300B Admission (Discharged) from 05/18/2016 in [facility] 400B  Alcohol Use Disorder Identification Test Final Score (AUDIT) 24 24 33 38 15      PHQ2-9    [place] ED from 02/04/2021 in [facility] DEPT Documentation from 10/29/2018 in [facility] Documentation from 10/27/2018 in [place]  PHQ-2 Total Score 4 0 0  PHQ-9 Total Score 13 5 0      [place] ED from 02/05/2021 in [facility] ED  from 02/04/2021 in [facility] DEPT ED from 10/14/2020 in [place] High Risk Moderate Risk No Risk        Musculoskeletal  Strength & Muscle Tone: within normal limits Gait & Station: normal Patient leans: N/A  Psychiatric Specialty Exam  Presentation  General Appearance: Appropriate for Environment; Casual  Eye Contact:Good  Speech:Clear and Coherent; Normal Rate  Speech Volume:Normal  Handedness:Right   Mood and Affect  Mood:Euphoric (brighter)  Affect:Appropriate; Congruent   Thought Process  Thought Processes:Coherent; Goal Directed; Linear  Descriptions of Associations:Intact  Orientation:Full (Time, Place and Person)  Thought Content:WDL; Logical  Diagnosis of Schizophrenia or Schizoaffective disorder in past: No    Hallucinations:Hallucinations: None  Ideas of Reference:None  Suicidal Thoughts:Suicidal Thoughts: No   Homicidal Thoughts:Homicidal Thoughts: No   Sensorium  Memory:Immediate Good; Recent Good; Remote Good  Judgment:Good  Insight:Good   Executive Functions  Concentration:Good  Attention Span:Good  [place] of Knowledge:Good  Language:Good   Psychomotor Activity  Psychomotor Activity:Psychomotor Activity: Normal   Assets  Assets:Communication Skills; Desire for Improvement; Financial Resources/Insurance; Resilience; Social Support   Sleep  Sleep:Sleep: Fair   Nutritional Assessment (For OBS and FBC admissions only) Has the patient had a weight loss or gain of 10 pounds or more in the last 3 months?: -- (patient unsure) Has the patient had a decrease in food intake/or appetite?: Yes Does the patient have dental problems?: No Does the patient have eating habits or behaviors that may be indicators of an eating disorder including binging or inducing vomiting?: No Has the patient recently lost weight without trying?:  Patient is unsure Has the patient been eating poorly because of a decreased appetite?: Yes Malnutrition Screening Tool Score: 3 Nutritional Assessment Referrals: Refer to Primary Care Provider   Physical Exam  Physical Exam Constitutional:      Appearance: Normal appearance. He is normal weight.  HENT:     Head: Normocephalic and atraumatic.  Eyes:     Extraocular Movements: Extraocular movements intact.  Pulmonary:     Effort: Pulmonary effort is normal.  Neurological:     General: No focal deficit present.     Mental Status: He is alert and oriented to person, place, and time.  Psychiatric:        Attention and Perception: Attention and perception normal.        Speech: Speech normal.        Behavior: Behavior normal. Behavior is cooperative.        Thought Content: Thought content normal.   Review of Systems  Constitutional:  Negative for chills and fever.  HENT:  Negative for hearing loss.   Eyes:  Negative for discharge and redness.  Respiratory:  Negative for cough.   Cardiovascular:  Negative for chest pain.  Gastrointestinal:  Positive for heartburn and nausea. Negative for abdominal pain and vomiting.       Nausea this AM that has resolved on its own. Describes as chronic  Musculoskeletal:  Negative for myalgias.  Neurological:  Negative for tremors and headaches.  Psychiatric/Behavioral:  Positive for substance abuse. Negative for suicidal ideas.   Blood pressure 110/78, pulse (!) 57, temperature 98 F (36.7 C), temperature source Tympanic, resp. rate 18, SpO2 99 %. There is no height or weight on file to calculate BMI.  Treatment Plan Summary: 55 yo male with history of AUD and MDD who presents to the [facility] as a transfer from [facility] ED for SI. Reviewed labs- CBC, CMP, UA unremarkable. TSH from May 2022 1.337 (wnl). UDS+THC, cocaine. BAL 175 on presentation to the ED. On interview at the [facility] he reports feeling suicidal upon presentation to [facility] but denies currently. Patient  placed on CIWA protocol; has not received any PRNs for withdrawal and has been medication complaint with lexapro, gabapentin and tolerating it well. Pt expresses interest in rehab. Patient remains appropriate for treatment at the [facility] at this time   MDD -continue lexapro 10 mg   AUD -continue  gabapentin 300 mg TID for etoh withdrawal  -detox protocol- ativan 1 mg q6 hrs prn CIWA>10 -pt expresses interest in substance use treatment   Cocaine use disorder Cannabis use disorder -will counsel on cessation -pt expresses interest in substance use treatment   Nicotine dependence -PRN nicorette gum for nicotine cravings   Dispo: ongoing. Likely discharge to inpatient rehab/substance use facility. Coordinating with the assistance of SW  Ival Bible, MD 02/06/2021 1:36 PM

## 2021-02-06 NOTE — Progress Notes (Signed)
Pt is asleep. No acute distress noted. Monitoring for safety.

## 2021-02-06 NOTE — ED Notes (Signed)
Pt A&O x 4, no distress noted, calm & cooperative, interactive with staff.  Monitoring for safety.

## 2021-02-06 NOTE — ED Notes (Signed)
Pt given breakfast.

## 2021-02-06 NOTE — Clinical Social Work Psych Note (Signed)
CSW Update  CSW met with patient for introduction and to begin discussions regarding discharge planning.   Patient shared that he has struggled with ongoing depressive symptoms since the passing of his wife, which occurred 18 years ago. He also shared that his longtime partner of 14 years passed away 3 years ago.  [name] [name] shared that he currently lives with his son and there was a recent incident with DSS involvement in regards to his two grandchildren. [name] [name] reports that he regularly uses crack cocaine, cannabis and ETOH to cope with his stressors.   He reports he was recently suicidal with a plan to jump off of a bridge.   [name] [name] reported that he was interested in possible residential substance abuse treatment upon his discharge from this facility.   CSW explained to the patient that his information would be referred to appropriate facilities for review. CSW ensured the patient once a bed had been identified, CSW would update him. [name] [name] expressed understanding.   Patient's information has been referred to the following facilities for possible placement:  [facility]    CSW will continue to follow for possible placement.    Radonna Ricker, MSW, LCSW Clinical Education officer, museum ([place]) [facility]

## 2021-02-06 NOTE — Progress Notes (Signed)
Pt is awake, alert and oriented. Pt did not voice any complaints of pain or discomfort. Administered scheduled meds with no incident. Pt denies SI/HI/AVH at this time. Monitoring for pt's safety.

## 2021-02-06 NOTE — Group Note (Signed)
Group Topic: Wellness  Group Date: 02/06/2021 Start Time: 1300 End Time: 1345 Facilitators: Hayes Ludwig  Department: [facility]  Number of Participants: 2  Group Focus: other : Wellness Treatment Modality:  Patient-Centered Therapy Interventions utilized were assignment Purpose: reinforce self-care  Pt was cooperative and active during group session. Pt verbalized their understanding of wellness and how they look to improve their overall wellness. Pt's were given an assignment where they had to complete a wellness assessment and write down ways they can improve their wellness.  Name: [name] [name] Date of Birth: 1966/05/03  MR: [id number]    Level of Participation: active Quality of Participation: cooperative Interactions with others: gave feedback Mood/Affect: positive Triggers (if applicable): N/A Cognition: goal directed Progress: Gaining insight Response: positive Plan: follow-up needed  Patients Problems:  Patient Active Problem List   Diagnosis Date Noted   MDD (major depressive disorder), recurrent episode, severe ([place]) 03/24/2019   MDD (major depressive disorder), recurrent severe, without psychosis ([place]) 02/08/2018   Major depressive disorder, recurrent episode, severe with anxious distress ([place]) 05/23/2016   PTSD (post-traumatic stress disorder) 05/23/2016   Cocaine use disorder, moderate, in early remission ([place]) 05/23/2016   Alcohol use disorder, moderate, dependence ([place]) 05/23/2016   Affective psychosis, bipolar ([place]) 04/30/2016   Cocaine abuse with cocaine-induced mood disorder ([place]) 03/04/2016   Alcohol dependence with uncomplicated withdrawal ([place])    Alcohol use disorder, severe, dependence ([place]) 01/18/2015   Alcohol dependence with alcohol-induced mood disorder ([place])    Suicidal ideation    Acute pancreatitis 03/20/2012   Polysubstance abuse ([place]) 03/20/2012   Cannabis abuse 01/12/2012   Alcohol dependence ([place])  10/15/2011   Abnormal ECG    Chest pain    CAD in native artery    GERD (gastroesophageal reflux disease)    Tobacco abuse    History of cocaine abuse ([place])    History of ETOH abuse    Bradycardia    Syncope

## 2021-02-07 DIAGNOSIS — F319 Bipolar disorder, unspecified: Secondary | ICD-10-CM | POA: Diagnosis not present

## 2021-02-07 DIAGNOSIS — F1020 Alcohol dependence, uncomplicated: Secondary | ICD-10-CM | POA: Diagnosis not present

## 2021-02-07 DIAGNOSIS — F1410 Cocaine abuse, uncomplicated: Secondary | ICD-10-CM | POA: Diagnosis not present

## 2021-02-07 DIAGNOSIS — F1210 Cannabis abuse, uncomplicated: Secondary | ICD-10-CM | POA: Diagnosis not present

## 2021-02-07 MED ORDER — HYDROXYZINE HCL 25 MG PO TABS: 25.0000 mg | ORAL_TABLET | Freq: Four times a day (QID) | ORAL | 0 refills | Status: DC | PRN

## 2021-02-07 MED ORDER — ESCITALOPRAM OXALATE 10 MG PO TABS: 10.0000 mg | ORAL_TABLET | Freq: Every day | ORAL | 0 refills | Status: DC

## 2021-02-07 MED ORDER — TRAZODONE HCL 50 MG PO TABS: 50.0000 mg | ORAL_TABLET | Freq: Every evening | ORAL | 0 refills | Status: DC | PRN

## 2021-02-07 MED ORDER — GABAPENTIN 300 MG PO CAPS: 300.0000 mg | ORAL_CAPSULE | Freq: Three times a day (TID) | ORAL | 0 refills | Status: DC

## 2021-02-07 NOTE — Consult Note (Signed)
[facility] Discharge Suicide Risk Assessment   Discharge Diagnoses: Active Problems:   MDD (major depressive disorder), recurrent episode, severe ([place])   Total Time spent with patient: 45 minutes  Subjective: "I feel great today. I just wanted to come here and get my head together. I thought about going to residential treatment but now I am going to get a motel and stay there for a couple days. I was hoping I could be discharged today."   Stay Summary: Patient seen and examined face to face by this provider and chart reviewed. On evaluation, the patient is alert and oriented x4. His mood is euthymic, and affect is congruent. His thought process is logical, and speech is coherent. He has remained calm and cooperative while on the unit. He has been denying suicidal ideations with the nursing staff. He denies suicidal ideations today with this provider. He denies homicidal ideations. He denies auditory and visual hallucinations. He does not appear to be responding to internal or external stimuli.  He denies alcohol withdrawal symptoms and does not appear to be in withdrawals. He is compliant with taking medications and reports tolerating his medications without any side effects. He states that he does not want to continue talking the vitamins because they make him feel nauseous. He states that he is not interested in residential treatment at this time but is willing to follow up here at the [facility] for medication management and therapy.    Musculoskeletal: Strength & Muscle Tone: within normal limits Gait & Station: normal Patient leans: N/A  Psychiatric Specialty Exam  Presentation  General Appearance: Appropriate for Environment  Eye Contact:Fair  Speech:Clear and Coherent  Speech Volume:Normal  Handedness:Right   Mood and Affect  Mood:Euthymic  Duration of Depression Symptoms: Greater than two weeks  Affect:Appropriate   Thought Process  Thought  Processes:Coherent  Descriptions of Associations:Intact  Orientation:Full (Time, Place and Person)  Thought Content:WDL  History of Schizophrenia/Schizoaffective disorder:No  Duration of Psychotic Symptoms:N/A  Hallucinations:Hallucinations: None  Ideas of Reference:None  Suicidal Thoughts:Suicidal Thoughts: No  Homicidal Thoughts:Homicidal Thoughts: No   Sensorium  Memory:Immediate Fair; Recent Fair; Remote Fair  Judgment:Fair  Insight:Fair   Executive Functions  Concentration:Fair  Attention Span:Fair  [place]   Psychomotor Activity  Psychomotor Activity:Psychomotor Activity: Normal   Assets  Assets:Communication Skills; Desire for Improvement; Financial Resources/Insurance; Social Support   Sleep  Sleep:Sleep: Fair Number of Hours of Sleep: 8   Physical Exam: Physical Exam HENT:     Head: Normocephalic.     Nose: Nose normal.  Eyes:     Conjunctiva/sclera: Conjunctivae normal.  Cardiovascular:     Rate and Rhythm: Normal rate.  Pulmonary:     Effort: Pulmonary effort is normal.  Musculoskeletal:        General: Normal range of motion.     Cervical back: Normal range of motion.  Neurological:     Mental Status: He is alert and oriented to person, place, and time.   Review of Systems  Constitutional: Negative.   HENT: Negative.    Gastrointestinal: Negative.   Genitourinary: Negative.   Musculoskeletal: Negative.   Skin: Negative.   Neurological: Negative.   Endo/Heme/Allergies: Negative.   Psychiatric/Behavioral:  Negative for depression and suicidal ideas.   Blood pressure 123/81, pulse 61, temperature (!) 97.3 F (36.3 C), temperature source Tympanic, resp. rate 18, SpO2 100 %. There is no height or weight on file to calculate BMI.  Mental Status Per  Nursing Assessment::   On Admission:     Demographic Factors:  Male, Low socioeconomic status, and Unemployed  Loss Factors: Loss of  significant relationship and Financial problems/change in socioeconomic status  Historical Factors: Prior suicide attempts and Family history of mental illness or substance abuse  Risk Reduction Factors:   Sense of responsibility to family and Religious beliefs about death  Continued Clinical Symptoms:  Depression:   Hopelessness Alcohol/Substance Abuse/Dependencies  Cognitive Features That Contribute To Risk:  None    Suicide Risk:  Minimal: No identifiable suicidal ideation.  Patients presenting with no risk factors but with morbid ruminations; may be classified as minimal risk based on the severity of the depressive symptoms   Follow-up Information     Services, [facility] Follow up.   Why: A referral has been made on your behalf. Contact information: [address] [place] [address] [phone]         [facility]   Specialty: Addiction Medicine Why: A referral has been made on your behalf. Contact information: [place] [address] [place], [facility] Follow up.   Why: A referral has been made on your behalf. Contact information: [place] [address] [phone]         Go to [facility].   Specialty: Behavioral Health Why: Please go during walk-in hours to establish outpatient medication management and therapy services.   Medication Management Walk-in: Monday-Friday from 8:00am-11:00am. Please arrive between 7:30am-7:45am as patients are seen on a first come, first served bases.   Therapy Walk-in: Monday-Wednesday from 8:00am-until slots are open.  Friday from 1:00pm-5:00pm. Please arrive between 12:00pm-12:30pm.   Please be sure to bring any discharge paperwork, inlcuding a list of medications. Contact information: [place] [phone]                 Plan Of Care/Follow-up recommendations:  Activity:  as tolerated   Marissa Calamity, NP 02/07/2021, 5:47 PM

## 2021-02-07 NOTE — ED Notes (Signed)
Pt given breakfast.

## 2021-02-07 NOTE — ED Notes (Signed)
Pt sleeping at present, no distress noted.  Monitoring for safety. 

## 2021-02-07 NOTE — ED Provider Notes (Addendum)
FBC/OBS ASAP Discharge Summary  Date and Time: 02/07/2021 12:09 PM  Name: [name] [name]  MRN:  [id number]   Discharge Diagnoses:  Final diagnoses:  Severe episode of recurrent major depressive disorder, without psychotic features ([place])  Alcohol use disorder, severe, dependence ([place])  Cannabis abuse  Cocaine abuse ([place])    Subjective: "I feel great today. I just wanted to come here and get my head together. I thought about going to residential treatment but now I am going to get a motel and stay there for a couple days. I was hoping I could be discharged today."  Stay Summary: Patient seen and examined face to face by this provider and chart reviewed. Case reviewed with Dr. Darleene Cleaver. On evaluation, the patient is alert and oriented x4. His mood is euthymic and affect is congruent. His thought process is logical and speech is coherent. He has remained calm and cooperative while on the unit. He has been denying suicidal ideations with the nursing staff. He denies suicidal ideations today with this provider. He denies homicidal ideations. He denies auditory and visual hallucinations. He does not appear to be responding to internal or external stimuli.  He denies alcohol withdrawal symptoms and does not appear to be in withdrawals. He  is compliant with taking medications and reports tolerating his medications without any side effects. He states that he does not want to continue talking the vitamins because they make him feel nauseous. He states that he is not interested in residential treatment at this time but is willing to follow up here at the [facility] for medication management and therapy.  Total Time spent with patient: 30 minutes  Past Psychiatric History: hx of polysubstance, MDD, cocaine abuse, alcohol use dx  Past Medical History:  Past Medical History:  Diagnosis Date   Abnormal ECG    a. early repolarization   Arthritis    "my whole left side"  (05/03/2014)   Bipolar disorder (HCC)    Bradycardia    a. asymptomatic   CAD in native artery    a. Nonobstructive cath 11/2007;  b. Presented with ST elevation - Nonobstructive cath 08/2011   Chest pain, mid sternal    Coronary artery disease    GERD (gastroesophageal reflux disease)    History of cocaine abuse ([place])    a. quit ? 2009   History of ETOH abuse    a. drinks 2 "40's" / wk   Marijuana abuse    a. uses ~ 1x /wk or less   Pneumonia    Stomach ulcer    Syncope    a. 12/2010 - presumed to be vasovagal   Tobacco abuse     Past Surgical History:  Procedure Laterality Date   CARDIAC CATHETERIZATION  2009; 08/2011   [name] 08/06/2011   LEFT HEART CATHETERIZATION WITH CORONARY ANGIOGRAM N/A 08/16/2011   Procedure: LEFT HEART CATHETERIZATION WITH CORONARY ANGIOGRAM;  Surgeon: [name], MD;  Location: [facility] CATH LAB;  Service: Cardiovascular;  Laterality: N/A;   Family History: History reviewed. No pertinent family history. Family Psychiatric History: sister has depression  Social History:  Social History   Substance and Sexual Activity  Alcohol Use Yes   Alcohol/week: 48.0 standard drinks   Types: 48 Cans of beer per week   Comment: 2 shot bottles yesterday     Social History   Substance and Sexual Activity  Drug Use Yes   Types: Marijuana, Cocaine   Comment: Crack one year ago  and marijuana was yesterday    Social History   Socioeconomic History   Marital status: Widowed    Spouse name: Not on file   Number of children: 7   Years of education: Not on file   Highest education level: 10th grade  Occupational History   Occupation: unemployed  Tobacco Use   Smoking status: Some Days    Packs/day: 0.25    Years: 2.00    Pack years: 0.50    Types: Cigarettes    Last attempt to quit: 06/24/2015    Years since quitting: 5.6   Smokeless tobacco: Never  Vaping Use   Vaping Use: Never used  Substance and Sexual Activity   Alcohol use: Yes     Alcohol/week: 48.0 standard drinks    Types: 48 Cans of beer per week    Comment: 2 shot bottles yesterday   Drug use: Yes    Types: Marijuana, Cocaine    Comment: Crack one year ago and marijuana was yesterday   Sexual activity: Not Currently  Other Topics Concern   Not on file  Social History Narrative   Lives with his daughter currently and receives SSI.    Social Determinants of Health   Financial Resource Strain: Not on file  Food Insecurity: Not on file  Transportation Needs: Not on file  Physical Activity: Not on file  Stress: Not on file  Social Connections: Not on file   SDOH:  SDOH Screenings   Alcohol Screen: Not on file  Depression (PHQ2-9): Medium Risk   PHQ-2 Score: 13  Financial Resource Strain: Not on file  Food Insecurity: Not on file  Housing: Not on file  Physical Activity: Not on file  Social Connections: Not on file  Stress: Not on file  Tobacco Use: High Risk   Smoking Tobacco Use: Some Days   Smokeless Tobacco Use: Never  Transportation Needs: Not on file    Tobacco Cessation:  Prescription not provided because: declined   Current Medications:  Current Facility-Administered Medications  Medication Dose Route Frequency Provider Last Rate Last Admin   acetaminophen (TYLENOL) tablet 650 mg  650 mg Oral Q6H PRN Ival Bible, MD       alum & mag hydroxide-simeth (MAALOX/MYLANTA) 200-200-20 MG/5ML suspension 30 mL  30 mL Oral Q4H PRN Ival Bible, MD       escitalopram (LEXAPRO) tablet 10 mg  10 mg Oral Daily Ival Bible, MD   10 mg at 02/07/21 0916   gabapentin (NEURONTIN) capsule 300 mg  300 mg Oral TID Ival Bible, MD   300 mg at 02/07/21 0916   hydrOXYzine (ATARAX/VISTARIL) tablet 25 mg  25 mg Oral Q6H PRN Ival Bible, MD   25 mg at 02/07/21 [id number]   loperamide (IMODIUM) capsule 2-4 mg  2-4 mg Oral PRN Ival Bible, MD       LORazepam (ATIVAN) tablet 1 mg  1 mg Oral Q6H PRN Ival Bible, MD        magnesium hydroxide (MILK OF MAGNESIA) suspension 30 mL  30 mL Oral Daily PRN Ival Bible, MD       multivitamin with minerals tablet 1 tablet  1 tablet Oral Daily Ival Bible, MD   1 tablet at 02/06/21 [id number]   nicotine polacrilex (NICORETTE) gum 2 mg  2 mg Oral PRN Ival Bible, MD       ondansetron (ZOFRAN-ODT) disintegrating tablet 4 mg  4 mg Oral Q6H PRN Ernie Hew  S, MD       pantoprazole (PROTONIX) EC tablet 40 mg  40 mg Oral Daily Ival Bible, MD   40 mg at 02/07/21 [id number]   thiamine tablet 100 mg  100 mg Oral Daily Ival Bible, MD   100 mg at 02/06/21 [id number]   traZODone (DESYREL) tablet 50 mg  50 mg Oral QHS PRN Ival Bible, MD   50 mg at 02/06/21 2111   Current Outpatient Medications  Medication Sig Dispense Refill   gabapentin (NEURONTIN) 300 MG capsule Take 1 capsule (300 mg total) by mouth 3 (three) times daily. 90 capsule 0   hydrOXYzine (ATARAX/VISTARIL) 25 MG tablet Take 1 tablet (25 mg total) by mouth every 6 (six) hours as needed (anxiety). 30 tablet 0   traZODone (DESYREL) 50 MG tablet Take 1 tablet (50 mg total) by mouth at bedtime as needed for sleep. 30 tablet 0    PTA Medications: (Not in a hospital admission)   Musculoskeletal  Strength & Muscle Tone: within normal limits Gait & Station: normal Patient leans: N/A  Psychiatric Specialty Exam  Presentation  General Appearance: Appropriate for Environment  Eye Contact:Fair  Speech:Clear and Coherent  Speech Volume:Normal  Handedness:Right   Mood and Affect  Mood:Euthymic  Affect:Appropriate   Thought Process  Thought Processes:Coherent  Descriptions of Associations:Intact  Orientation:Full (Time, Place and Person)  Thought Content:WDL  Diagnosis of Schizophrenia or Schizoaffective disorder in past: No    Hallucinations:Hallucinations: None  Ideas of Reference:None  Suicidal Thoughts:Suicidal Thoughts: No  Homicidal  Thoughts:Homicidal Thoughts: No   Sensorium  Memory:Immediate Fair; Recent Fair; Remote Fair  Judgment:Fair  Insight:Fair   Executive Functions  Concentration:Fair  Attention Span:Fair  [place]   Psychomotor Activity  Psychomotor Activity:Psychomotor Activity: Normal   Assets  Assets:Communication Skills; Desire for Improvement; Financial Resources/Insurance; Social Support   Sleep  Sleep:Sleep: Fair Number of Hours of Sleep: 8   No data recorded  Physical Exam  Physical Exam HENT:     Head: Normocephalic.     Nose: Nose normal.  Eyes:     Conjunctiva/sclera: Conjunctivae normal.  Cardiovascular:     Rate and Rhythm: Normal rate.  Pulmonary:     Effort: Pulmonary effort is normal.  Musculoskeletal:        General: Normal range of motion.     Cervical back: Normal range of motion.  Neurological:     Mental Status: He is alert and oriented to person, place, and time.   Review of Systems  Constitutional: Negative.   HENT: Negative.    Eyes: Negative.   Respiratory: Negative.    Cardiovascular: Negative.   Gastrointestinal: Negative.   Genitourinary: Negative.   Musculoskeletal: Negative.   Skin: Negative.   Neurological: Negative.   Endo/Heme/Allergies: Negative.   Psychiatric/Behavioral:  Negative for depression and suicidal ideas.   Blood pressure 123/81, pulse 61, temperature (!) 97.3 F (36.3 C), temperature source Tympanic, resp. rate 18, SpO2 100 %. There is no height or weight on file to calculate BMI.  Demographic Factors:  Male and Low socioeconomic status  Loss Factors: Loss of significant relationship and Financial problems/change in socioeconomic status  Historical Factors: Prior suicide attempts and Family history of mental illness or substance abuse  Risk Reduction Factors:   Sense of responsibility to family and Religious beliefs about death  Continued Clinical Symptoms:   Alcohol/Substance Abuse/Dependencies  Cognitive Features That Contribute To Risk:  None    Suicide Risk:  Minimal: No identifiable suicidal ideation.  Patients presenting with no risk factors but with morbid ruminations; may be classified as minimal risk based on the severity of the depressive symptoms  Plan Of Care/Follow-up recommendations:  Activity:  as tolerated   Medications sent electronically to the pharmacy on file. Labs reviewed.   Disposition: Discharge to home.   Follow-up Information     Services, [facility] Follow up.   Why: A referral has been made on your behalf. Contact information: [address] [place] [address] [phone]         [facility]   Specialty: Addiction Medicine Why: A referral has been made on your behalf. Contact information: [place] [address] [place], [facility] Follow up.   Why: A referral has been made on your behalf. Contact information: [place] [address] [phone]         Go to [facility].   Specialty: Behavioral Health Why: Please go during walk-in hours to establish outpatient medication management and therapy services.   Medication Management Walk-in: Monday-Friday from 8:00am-11:00am. Please arrive between 7:30am-7:45am as patients are seen on a first come, first served bases.   Therapy Walk-in: Monday-Wednesday from 8:00am-until slots are open.  Friday from 1:00pm-5:00pm. Please arrive between 12:00pm-12:30pm.   Please be sure to bring any discharge paperwork, inlcuding a list of medications. Contact information: [place] [phone]                 Marissa Calamity, NP 02/07/2021, 12:09 PM

## 2021-02-07 NOTE — ED Notes (Addendum)
Pt alert and oriented on the unit. Education, support, and encouragement provided. Discharge/AVS, medications, prescriptions, and follow up appointments reviewed with pt. Pt verbalized understanding of his AVS and a copy was given to him. Suicide prevention resources provided, and pt's belongings in locker returned. and belongings sheet signed. Pt denies SI/HI, A/VH, pain, or any concerns at this time. Pt ambulatory on and off unit. Pt discharged to lobby.

## 2021-02-07 NOTE — ED Notes (Signed)
Pt did not attend group session today.

## 2021-02-07 NOTE — ED Notes (Signed)
Pt sleeping at present, no distress noted, monitoring for safety. 

## 2021-02-07 NOTE — ED Notes (Signed)
Pt c/o anxiety and requests medication. Vistaril 25 mg PRN given.

## 2021-02-16 ENCOUNTER — Emergency Department (HOSPITAL_COMMUNITY)
Admission: EM | Admit: 2021-02-16 | Discharge: 2021-02-16 | Disposition: A | Discharge status: Home / Self Care | Payer: Medicaid Other | Attending: Emergency Medicine | Admitting: Emergency Medicine

## 2021-02-16 ENCOUNTER — Encounter (HOSPITAL_COMMUNITY): Payer: Self-pay | Admitting: Emergency Medicine

## 2021-02-16 ENCOUNTER — Other Ambulatory Visit: Payer: Self-pay

## 2021-02-16 DIAGNOSIS — K649 Unspecified hemorrhoids: Secondary | ICD-10-CM | POA: Insufficient documentation

## 2021-02-16 DIAGNOSIS — Z5321 Procedure and treatment not carried out due to patient leaving prior to being seen by health care provider: Secondary | ICD-10-CM | POA: Diagnosis not present

## 2021-02-16 MED ORDER — OXYCODONE-ACETAMINOPHEN 5-325 MG PO TABS
1.0000 | ORAL_TABLET | Freq: Once | ORAL | Status: AC
  Administered 2021-02-16: 1 via ORAL
  Filled 2021-02-16: qty 1

## 2021-02-16 NOTE — ED Triage Notes (Signed)
Pt states that he has a hemorrhoid that is bleeding. C/o pain, unable to sit. Requesting male providers.

## 2021-02-16 NOTE — ED Notes (Signed)
Patient stated the wait has been too long left without being seen

## 2021-02-16 NOTE — ED Provider Notes (Signed)
MSE was initiated and I personally evaluated the patient and placed orders (if any) at  1:58 AM on February 16, 2021.  Patient to ED with rectal pain felt to be related to hemorrhoids that are bleeding.  Today's Vitals   02/16/21 0151 02/16/21 0159  BP: (!) 149/86   Pulse: 99   Resp: 18   Temp: 98.1 F (36.7 C)   TempSrc: Oral   SpO2: 97%   PainSc:  8    There is no height or weight on file to calculate BMI.  Patient appears uncomfortable PREFERS A MALE EXAMINER  The patient appears stable so that the remainder of the MSE may be completed by another provider.   Charlann Lange, PA-C 02/16/21 0201    Palumbo, April, MD 02/16/21 [id number]

## 2021-02-20 ENCOUNTER — Emergency Department (HOSPITAL_COMMUNITY)
Admission: EM | Admit: 2021-02-20 | Discharge: 2021-02-20 | Disposition: A | Discharge status: Home / Self Care | Payer: Medicaid Other | Attending: Emergency Medicine | Admitting: Emergency Medicine

## 2021-02-20 ENCOUNTER — Other Ambulatory Visit: Payer: Self-pay

## 2021-02-20 ENCOUNTER — Emergency Department (HOSPITAL_COMMUNITY): Payer: Medicaid Other

## 2021-02-20 ENCOUNTER — Encounter (HOSPITAL_COMMUNITY): Payer: Self-pay | Admitting: Emergency Medicine

## 2021-02-20 DIAGNOSIS — R072 Precordial pain: Secondary | ICD-10-CM | POA: Diagnosis not present

## 2021-02-20 DIAGNOSIS — F17210 Nicotine dependence, cigarettes, uncomplicated: Secondary | ICD-10-CM | POA: Diagnosis not present

## 2021-02-20 DIAGNOSIS — K5900 Constipation, unspecified: Secondary | ICD-10-CM | POA: Insufficient documentation

## 2021-02-20 DIAGNOSIS — I2510 Atherosclerotic heart disease of native coronary artery without angina pectoris: Secondary | ICD-10-CM | POA: Insufficient documentation

## 2021-02-20 DIAGNOSIS — K649 Unspecified hemorrhoids: Secondary | ICD-10-CM | POA: Insufficient documentation

## 2021-02-20 LAB — TROPONIN I (HIGH SENSITIVITY)
Troponin I (High Sensitivity): 5 ng/L (ref <18)
Troponin I (High Sensitivity): 4 ng/L (ref <18)

## 2021-02-20 LAB — BASIC METABOLIC PANEL
Anion gap: 9 (ref 5–15)
BUN: 8 mg/dL (ref 6–20)
CO2: 25 mmol/L (ref 22–32)
Calcium: 10 mg/dL (ref 8.9–10.3)
Chloride: 99 mmol/L (ref 98–111)
Creatinine, Ser: 1.07 mg/dL (ref 0.61–1.24)
GFR, Estimated: >60 mL/min (ref >60)
Glucose, Bld: 105 mg/dL — ABNORMAL HIGH (ref 70–99)
Potassium: 4.2 mmol/L (ref 3.5–5.1)
Sodium: 133 mmol/L — ABNORMAL LOW (ref 135–145)

## 2021-02-20 LAB — CBC
HCT: 42.5 % (ref 39.0–52.0)
Hemoglobin: 14.6 g/dL (ref 13.0–17.0)
MCH: 31.3 pg (ref 26.0–34.0)
MCHC: 34.4 g/dL (ref 30.0–36.0)
MCV: 91 fL (ref 80.0–100.0)
Platelets: 345 10*3/uL (ref 150–400)
RBC: 4.67 MIL/uL (ref 4.22–5.81)
RDW: 13.6 % (ref 11.5–15.5)
WBC: 6 10*3/uL (ref 4.0–10.5)
nRBC: 0 % (ref 0.0–0.2)

## 2021-02-20 MED ORDER — ACETAMINOPHEN 325 MG PO TABS
650.0000 mg | ORAL_TABLET | Freq: Once | ORAL | Status: AC
  Administered 2021-02-20: 650 mg via ORAL
  Filled 2021-02-20: qty 2

## 2021-02-20 MED ORDER — ALUM & MAG HYDROXIDE-SIMETH 200-200-20 MG/5ML PO SUSP
30.0000 mL | Freq: Once | ORAL | Status: AC
  Administered 2021-02-20: 30 mL via ORAL

## 2021-02-20 MED ORDER — DOCUSATE SODIUM 250 MG PO CAPS: 250.0000 mg | ORAL_CAPSULE | Freq: Every day | ORAL | 0 refills | Status: DC

## 2021-02-20 MED ORDER — LIDOCAINE VISCOUS HCL 2 % MT SOLN
15.0000 mL | Freq: Once | OROMUCOSAL | Status: AC
  Administered 2021-02-20: 15 mL via ORAL
  Filled 2021-02-20: qty 15

## 2021-02-20 MED ORDER — ALUM & MAG HYDROXIDE-SIMETH 200-200-20 MG/5ML PO SUSP
30.0000 mL | Freq: Once | ORAL | Status: DC
Start: 2021-02-20 — End: 2021-02-20
  Filled 2021-02-20: qty 30

## 2021-02-20 MED ORDER — HYDROCORTISONE (PERIANAL) 2.5 % EX CREA: 1.0000 "application " | TOPICAL_CREAM | Freq: Two times a day (BID) | CUTANEOUS | 0 refills | Status: DC

## 2021-02-20 NOTE — ED Provider Notes (Addendum)
[facility] EMERGENCY DEPARTMENT Provider Note   CSN: [id number] Arrival date & time: 02/20/21  0352     History Chief Complaint  Patient presents with   Chest Pain    [name] [name] is a 55 y.o. male.  55 year old male with prior medical history as detailed below presents for evaluation.  Patient complains of multiple issues.  He complains of chest pain.  This is been ongoing for 2 days.  He describes substernal discomfort radiating from his epigastrium.  He denies current discomfort.  He denies associated diaphoresis, shortness of breath, fever.  Patient also complains of significant constipation.  He reports that because of his constipation his hemorrhoids are more uncomfortable.  The history is provided by the patient.  Illness Location:  Chest pain, constipation, painful hemorrhoids Severity:  Mild Onset quality:  Gradual Duration:  3 days Timing:  Intermittent Progression:  Waxing and waning Chronicity:  Recurrent Associated symptoms: no abdominal pain       Past Medical History:  Diagnosis Date   Abnormal ECG    a. early repolarization   Arthritis    "my whole left side" (05/03/2014)   Bipolar disorder ([place])    Bradycardia    a. asymptomatic   CAD in native artery    a. Nonobstructive cath 11/2007;  b. Presented with ST elevation - Nonobstructive cath 08/2011   Chest pain, mid sternal    Coronary artery disease    GERD (gastroesophageal reflux disease)    History of cocaine abuse ([place])    a. quit ? 2009   History of ETOH abuse    a. drinks 2 "40's" / wk   Marijuana abuse    a. uses ~ 1x /wk or less   Pneumonia    Stomach ulcer    Syncope    a. 12/2010 - presumed to be vasovagal   Tobacco abuse     Patient Active Problem List   Diagnosis Date Noted   MDD (major depressive disorder), recurrent episode, severe ([place]) 03/24/2019   MDD (major depressive disorder), recurrent severe, without psychosis ([place]) 02/08/2018   Major depressive  disorder, recurrent episode, severe with anxious distress ([place]) 05/23/2016   PTSD (post-traumatic stress disorder) 05/23/2016   Cocaine use disorder, moderate, in early remission ([place]) 05/23/2016   Alcohol use disorder, moderate, dependence ([place]) 05/23/2016   Affective psychosis, bipolar ([place]) 04/30/2016   Cocaine abuse with cocaine-induced mood disorder ([place]) 03/04/2016   Alcohol dependence with uncomplicated withdrawal ([place])    Alcohol use disorder, severe, dependence ([place]) 01/18/2015   Alcohol dependence with alcohol-induced mood disorder ([place])    Suicidal ideation    Acute pancreatitis 03/20/2012   Polysubstance abuse ([place]) 03/20/2012   Cannabis abuse 01/12/2012   Alcohol dependence ([place]) 10/15/2011   Abnormal ECG    Chest pain    CAD in native artery    GERD (gastroesophageal reflux disease)    Tobacco abuse    History of cocaine abuse ([place])    History of ETOH abuse    Bradycardia    Syncope     Past Surgical History:  Procedure Laterality Date   CARDIAC CATHETERIZATION  2009; 08/2011   [name] 08/06/2011   LEFT HEART CATHETERIZATION WITH CORONARY ANGIOGRAM N/A 08/16/2011   Procedure: LEFT HEART CATHETERIZATION WITH CORONARY ANGIOGRAM;  Surgeon: [name], MD;  Location: [facility] CATH LAB;  Service: Cardiovascular;  Laterality: N/A;       No family history on file.  Social History   Tobacco Use  Smoking status: Some Days    Packs/day: 0.25    Years: 2.00    Pack years: 0.50    Types: Cigarettes    Last attempt to quit: 06/24/2015    Years since quitting: 5.6   Smokeless tobacco: Never  Vaping Use   Vaping Use: Never used  Substance Use Topics   Alcohol use: Yes    Alcohol/week: 48.0 standard drinks    Types: 48 Cans of beer per week    Comment: 2 shot bottles yesterday   Drug use: Yes    Types: Marijuana, Cocaine    Comment: Crack one year ago and marijuana was yesterday    Home Medications Prior to Admission medications   Medication Sig Start Date  End Date Taking? Authorizing Provider  escitalopram (LEXAPRO) 10 MG tablet Take 1 tablet (10 mg total) by mouth daily. 02/07/21   White, Patrice L, NP  gabapentin (NEURONTIN) 300 MG capsule Take 1 capsule (300 mg total) by mouth 3 (three) times daily. 02/07/21   White, Patrice L, NP  hydrOXYzine (ATARAX/VISTARIL) 25 MG tablet Take 1 tablet (25 mg total) by mouth every 6 (six) hours as needed (anxiety). 02/07/21   White, Patrice L, NP  traZODone (DESYREL) 50 MG tablet Take 1 tablet (50 mg total) by mouth at bedtime as needed for sleep. 02/07/21   White, Patrice L, NP    Allergies    Aspirin, Pepperoni [pickled meat], and Tomato  Review of Systems   Review of Systems  Gastrointestinal:  Negative for abdominal pain.  All other systems reviewed and are negative.  Physical Exam Updated Vital Signs BP 131/82 (BP Location: Right Arm)   Pulse (!) 43   Temp 98.2 F (36.8 C)   Resp 18   SpO2 100%   Physical Exam Vitals and nursing note reviewed.  Constitutional:      General: He is not in acute distress.    Appearance: Normal appearance. He is well-developed.  HENT:     Head: Normocephalic and atraumatic.  Eyes:     Conjunctiva/sclera: Conjunctivae normal.     Pupils: Pupils are equal, round, and reactive to light.  Cardiovascular:     Rate and Rhythm: Normal rate and regular rhythm.     Heart sounds: Normal heart sounds.  Pulmonary:     Effort: Pulmonary effort is normal. No respiratory distress.     Breath sounds: Normal breath sounds.  Abdominal:     General: There is no distension.     Palpations: Abdomen is soft.     Tenderness: There is no abdominal tenderness.  Genitourinary:    Comments: Hemorrhoidal tags noted on rectal exam.  No thrombosis noted.  No cellulitis or abscess noted.  No active bleeding noted. Musculoskeletal:        General: No deformity. Normal range of motion.     Cervical back: Normal range of motion and neck supple.  Skin:    General: Skin is warm and dry.   Neurological:     General: No focal deficit present.     Mental Status: He is alert and oriented to person, place, and time.    ED Results / Procedures / Treatments   Labs (all labs ordered are listed, but only abnormal results are displayed) Labs Reviewed  BASIC METABOLIC PANEL - Abnormal; Notable for the following components:      Result Value   Sodium 133 (*)    Glucose, Bld 105 (*)    All other components within normal limits  CBC  TROPONIN I (HIGH SENSITIVITY)  TROPONIN I (HIGH SENSITIVITY)    EKG EKG Interpretation  Date/Time:  Friday February 20 2021 03:53:56 EDT Ventricular Rate:  42 PR Interval:  172 QRS Duration: 76 QT Interval:  428 QTC Calculation: 357 R Axis:   53 Text Interpretation: Marked sinus bradycardia Abnormal ECG Confirmed by Dene Gentry [phone]) on 02/20/2021 2:02:50 PM  Radiology DG Chest 2 View  Result Date: 02/20/2021 CLINICAL DATA:  Chest pain that started 2 days ago EXAM: CHEST - 2 VIEW COMPARISON:  10/14/2020 FINDINGS: Artifact from shirt buttons. Normal heart size and mediastinal contours. No acute infiltrate or edema. No effusion or pneumothorax. No acute osseous findings. IMPRESSION: No active cardiopulmonary disease. Electronically Signed   By: Jorje Guild M.D.   On: 02/20/2021 04:34    Procedures Procedures   Medications Ordered in ED Medications  alum & mag hydroxide-simeth (MAALOX/MYLANTA) 200-200-20 MG/5ML suspension 30 mL (has no administration in time range)  alum & mag hydroxide-simeth (MAALOX/MYLANTA) 200-200-20 MG/5ML suspension 30 mL (has no administration in time range)    And  lidocaine (XYLOCAINE) 2 % viscous mouth solution 15 mL (has no administration in time range)  acetaminophen (TYLENOL) tablet 650 mg (650 mg Oral Given 02/20/21 0920)    ED Course  I have reviewed the triage vital signs and the nursing notes.  Pertinent labs & imaging results that were available during my care of the patient were reviewed by  me and considered in my medical decision making (see chart for details).    MDM Rules/Calculators/A&P                           MDM  MSE complete  [name] [name] was evaluated in Emergency Department on 02/20/2021 for the symptoms described in the history of present illness. He was evaluated in the context of the global COVID-19 pandemic, which necessitated consideration that the patient might be at risk for infection with the SARS-CoV-2 virus that causes COVID-19. Institutional protocols and algorithms that pertain to the evaluation of patients at risk for COVID-19 are in a state of rapid change based on information released by regulatory bodies including the CDC and federal and state organizations. These policies and algorithms were followed during the patient's care in the ED.  Patient is presenting with multiple complaints including chest pain.  Is described chest pain is atypical.  EKG is without evidence of ischemia.  Troponin x2 is nonelevated.  Patient also complains of constipation and hemorrhoid pain.  This appears to be the patient's primary complaint.  Patient without evidence of thrombosis on exam.  Patient given advice regarding management of his constipation and hemorrhoidal discomfort in the outpatient setting.  Importance of close follow-up is stressed.  Strict return precautions given and understood.   Final Clinical Impression(s) / ED Diagnoses Final diagnoses:  Constipation, unspecified constipation type  Hemorrhoids, unspecified hemorrhoid type    Rx / DC Orders ED Discharge Orders          Ordered    docusate sodium (COLACE) 250 MG capsule  Daily        02/20/21 1414    hydrocortisone (ANUSOL-HC) 2.5 % rectal cream  2 times daily        02/20/21 1414             Valarie Merino, MD 02/20/21 1415    Valarie Merino, MD 02/20/21 1505
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
## 2021-02-20 NOTE — Discharge Instructions (Addendum)
Return for any problem.   Avoid use ibuprofen - use may exacerbate stomach discomfort.

## 2021-02-20 NOTE — ED Provider Notes (Signed)
Emergency Medicine Provider Triage Evaluation Note  [name] [name] , a 55 y.o. male  was evaluated in triage.  Pt complains of chest pain that started 2 days ago.  Has history of CAD and cocaine abuse.  States that beginning yesterday at around noon the pain has been constant.  He denies any shortness of breath.  He does report some associated nausea.  States that he took some Tums with some relief.  Was given 2 of nitro by EMS with improvement of pain..  Review of Systems  Positive: Chest pain Negative: sob  Physical Exam  BP 108/73 (BP Location: Right Arm)   Pulse (!) 45   Temp 98.1 F (36.7 C) (Oral)   Resp 19   SpO2 100%  Gen:   Awake, no distress   Resp:  Normal effort  MSK:   Moves extremities without difficulty  Other:    Medical Decision Making  Medically screening exam initiated at 3:59 AM.  Appropriate orders placed.  [name] was informed that the remainder of the evaluation will be completed by another provider, this initial triage assessment does not replace that evaluation, and the importance of remaining in the ED until their evaluation is complete.  Chest pain   Montine Circle, PA-C 02/20/21 0400    Arnaldo Natal, MD 02/20/21 [phone]

## 2021-12-24 ENCOUNTER — Emergency Department (HOSPITAL_COMMUNITY)
Admission: EM | Admit: 2021-12-24 | Discharge: 2021-12-25 | Discharge status: Against Medical Advice | Payer: Medicaid Other | Attending: Emergency Medicine | Admitting: Emergency Medicine

## 2021-12-24 DIAGNOSIS — K610 Anal abscess: Secondary | ICD-10-CM | POA: Insufficient documentation

## 2021-12-24 DIAGNOSIS — Z5321 Procedure and treatment not carried out due to patient leaving prior to being seen by health care provider: Secondary | ICD-10-CM | POA: Diagnosis not present

## 2021-12-24 NOTE — ED Triage Notes (Signed)
Pt reported to ED with c/o abscess/boil on left buttock near anus that is causing painful bowel movements and constipation. Pt states that area has been present for 8 months.

## 2021-12-25 ENCOUNTER — Emergency Department (HOSPITAL_COMMUNITY): Payer: Medicaid Other

## 2021-12-25 ENCOUNTER — Other Ambulatory Visit: Payer: Self-pay

## 2021-12-25 ENCOUNTER — Encounter (HOSPITAL_COMMUNITY): Payer: Self-pay | Admitting: Emergency Medicine

## 2021-12-25 LAB — CBC WITH DIFFERENTIAL/PLATELET
Abs Immature Granulocytes: 0 10*3/uL (ref 0.00–0.07)
Basophils Absolute: 0.1 10*3/uL (ref 0.0–0.1)
Basophils Relative: 1 %
Eosinophils Absolute: 0.1 10*3/uL (ref 0.0–0.5)
Eosinophils Relative: 2 %
HCT: 39.8 % (ref 39.0–52.0)
Hemoglobin: 13.8 g/dL (ref 13.0–17.0)
Immature Granulocytes: 0 %
Lymphocytes Relative: 29 %
Lymphs Abs: 2.1 10*3/uL (ref 0.7–4.0)
MCH: 30.1 pg (ref 26.0–34.0)
MCHC: 34.7 g/dL (ref 30.0–36.0)
MCV: 86.9 fL (ref 80.0–100.0)
Monocytes Absolute: 0.7 10*3/uL (ref 0.1–1.0)
Monocytes Relative: 10 %
Neutro Abs: 4.1 10*3/uL (ref 1.7–7.7)
Neutrophils Relative %: 58 %
Platelets: 281 10*3/uL (ref 150–400)
RBC: 4.58 MIL/uL (ref 4.22–5.81)
RDW: 13.5 % (ref 11.5–15.5)
WBC: 7.1 10*3/uL (ref 4.0–10.5)
nRBC: 0 % (ref 0.0–0.2)

## 2021-12-25 LAB — BASIC METABOLIC PANEL
Anion gap: 8 (ref 5–15)
BUN: 12 mg/dL (ref 6–20)
CO2: 24 mmol/L (ref 22–32)
Calcium: 9.2 mg/dL (ref 8.9–10.3)
Chloride: 106 mmol/L (ref 98–111)
Creatinine, Ser: 1.31 mg/dL — ABNORMAL HIGH (ref 0.61–1.24)
GFR, Estimated: >60 mL/min (ref >60)
Glucose, Bld: 76 mg/dL (ref 70–99)
Potassium: 4.3 mmol/L (ref 3.5–5.1)
Sodium: 138 mmol/L (ref 135–145)

## 2021-12-25 MED ORDER — OXYCODONE-ACETAMINOPHEN 5-325 MG PO TABS
1.0000 | ORAL_TABLET | Freq: Once | ORAL | Status: AC
  Administered 2021-12-25: 1 via ORAL
  Filled 2021-12-25: qty 1

## 2021-12-25 NOTE — ED Provider Triage Note (Signed)
  Emergency Medicine Provider Triage Evaluation Note  MRN:  [id number]  Arrival date & time: 12/25/21    Medically screening exam initiated at 12:01 AM.   CC:   Abscess   HPI:  [name] [name] is a 56 y.o. year-old male presents to the ED with chief complaint of pain near his anus.  States he has a mass.  It comes and goes over months.  Denies fevers.  History provided by patient. ROS:  -As included in HPI PE:   Vitals:   12/24/21 2353  BP: 116/86  Pulse: 70  Resp: 16  Temp: 97.7 F (36.5 C)  SpO2: 99%    Non-toxic appearing No respiratory distress Indurated and tender perianal soft tissue MDM:  Based on signs and symptoms, perianal abscess is highest on my differential, followed by perianal warts. I've ordered imaging and labs in triage to expedite lab/diagnostic workup.  Patient was informed that the remainder of the evaluation will be completed by another provider, this initial triage assessment does not replace that evaluation, and the importance of remaining in the ED until their evaluation is complete.    Montine Circle, PA-C 12/25/21 0003

## 2021-12-25 NOTE — ED Notes (Signed)
Called pt x3 for vitals, no response. 

## 2021-12-25 NOTE — ED Notes (Signed)
Pt reported to this RN that he is suicidal and homeless. Pt states that he has nowhere to stay because he has recently moved to the area. Pt does not express a plan but acknowledges thoughts. EDP in triage notified of the same.

## 2022-01-18 ENCOUNTER — Other Ambulatory Visit: Payer: Self-pay

## 2022-01-18 ENCOUNTER — Encounter (HOSPITAL_COMMUNITY): Payer: Self-pay | Admitting: *Deleted

## 2022-01-18 ENCOUNTER — Emergency Department (HOSPITAL_COMMUNITY)
Admission: EM | Admit: 2022-01-18 | Discharge: 2022-01-19 | Disposition: A | Discharge status: Home / Self Care | Payer: Medicaid Other | Attending: Emergency Medicine | Admitting: Emergency Medicine

## 2022-01-18 DIAGNOSIS — Z5321 Procedure and treatment not carried out due to patient leaving prior to being seen by health care provider: Secondary | ICD-10-CM | POA: Insufficient documentation

## 2022-01-18 DIAGNOSIS — M25512 Pain in left shoulder: Secondary | ICD-10-CM | POA: Diagnosis not present

## 2022-01-18 DIAGNOSIS — G44209 Tension-type headache, unspecified, not intractable: Secondary | ICD-10-CM | POA: Diagnosis not present

## 2022-01-18 DIAGNOSIS — M25511 Pain in right shoulder: Secondary | ICD-10-CM | POA: Diagnosis present

## 2022-01-18 MED ORDER — CYCLOBENZAPRINE HCL 10 MG PO TABS
10.0000 mg | ORAL_TABLET | Freq: Once | ORAL | Status: AC
  Administered 2022-01-18: 10 mg via ORAL
  Filled 2022-01-18: qty 1

## 2022-01-18 NOTE — ED Provider Triage Note (Signed)
  Emergency Medicine Provider Triage Evaluation Note  MRN:  [id number]  Arrival date & time: 01/18/22    Medically screening exam initiated at 11:25 PM.   CC:   Headache and neck pain  HPI:  [name] is a 57 y.o. year-old male presents to the ED with chief complaint of headache and neck pain.  Reports muscle tightness in his upper traps for the past month or so.  Has tried OTC meds without relief.  Symptoms are worsened with palpation and movement.  History provided by patient. ROS:  -As included in HPI PE:   Vitals:   01/18/22 2318  BP: 127/75  Pulse: 78  Resp: 18  Temp: 98.3 F (36.8 C)  SpO2: 97%    Non-toxic appearing No respiratory distress  MDM:  Based on signs and symptoms, tension headache 2/2 muscle spasm is highest on my differential,. I've ordered flexeril and offered rx and discharge, but patient wants to wait to make sure he is feeling better before going home.   Patient was informed that the remainder of the evaluation will be completed by another provider, this initial triage assessment does not replace that evaluation, and the importance of remaining in the ED until their evaluation is complete.    Montine Circle, PA-C 01/18/22 2327

## 2022-01-18 NOTE — ED Triage Notes (Signed)
For several weeks, has had pain in both shoulders, now the pain goes up the left sided and into his head causing a tension headache.

## 2022-01-19 NOTE — ED Notes (Signed)
Pt did not respond for vitals

## 2022-01-19 NOTE — ED Notes (Signed)
Pt not responding to vitals

## 2022-01-29 ENCOUNTER — Encounter: Payer: Self-pay | Admitting: *Deleted

## 2022-01-29 NOTE — Congregational Nurse Program (Signed)
  Dept: [phone]   Congregational Nurse Program Note  Date of Encounter: 01/29/2022  Past Medical History: Past Medical History:  Diagnosis Date   Abnormal ECG    a. early repolarization   Arthritis    "my whole left side" (05/03/2014)   Bipolar disorder ([place])    Bradycardia    a. asymptomatic   CAD in native artery    a. Nonobstructive cath 11/2007;  b. Presented with ST elevation - Nonobstructive cath 08/2011   Chest pain, mid sternal    Coronary artery disease    GERD (gastroesophageal reflux disease)    History of cocaine abuse ([place])    a. quit ? 2009   History of ETOH abuse    a. drinks 2 "40's" / wk   Marijuana abuse    a. uses ~ 1x /wk or less   Pneumonia    Stomach ulcer    Syncope    a. 12/2010 - presumed to be vasovagal   Tobacco abuse     Encounter Details:  CNP Questionnaire - 01/29/22 1310       Questionnaire   Do you give verbal consent to treat you today? Yes    Location Patient Served  [facility]    Visit Setting Church or Organization    Patient Status Homeless    Insurance [facility]    Insurance Referral N/A    Medication N/A    Medical Provider No    Screening Referrals N/A    Medical Referral Non-[facility] PCP/Clinic    Medical Appointment Made Other    Food N/A    Transportation N/A    Housing/Utilities No permanent housing    Interpersonal Safety N/A    Intervention Support    ED Visit Averted N/A    Life-Saving Intervention Made N/A            Client  seen at [facility] for follow up ED visit. Client reports he does not have a PCP or medications. He has no prescriptions. Client reports being at old [place] with stress related to homelessness. He is currently working with [facility] path team for housing. Referred client to [facility] near [facility] where he eats during the day. Client has difficulty with reading and writing. Assisted client in completing form to take to [facility] for an appointment to establish  a PCP. Rilley Stash W RN CN

## 2022-03-25 ENCOUNTER — Emergency Department (HOSPITAL_COMMUNITY)
Admission: EM | Admit: 2022-03-25 | Discharge: 2022-03-25 | Disposition: A | Discharge status: Home / Self Care | Payer: Medicaid Other | Attending: Emergency Medicine | Admitting: Emergency Medicine

## 2022-03-25 ENCOUNTER — Encounter (HOSPITAL_COMMUNITY): Payer: Self-pay | Admitting: Emergency Medicine

## 2022-03-25 DIAGNOSIS — M542 Cervicalgia: Secondary | ICD-10-CM | POA: Insufficient documentation

## 2022-03-25 DIAGNOSIS — M62838 Other muscle spasm: Secondary | ICD-10-CM | POA: Insufficient documentation

## 2022-03-25 DIAGNOSIS — G44209 Tension-type headache, unspecified, not intractable: Secondary | ICD-10-CM | POA: Diagnosis not present

## 2022-03-25 DIAGNOSIS — R519 Headache, unspecified: Secondary | ICD-10-CM | POA: Diagnosis present

## 2022-03-25 MED ORDER — CYCLOBENZAPRINE HCL 10 MG PO TABS
5.0000 mg | ORAL_TABLET | Freq: Once | ORAL | Status: AC
  Administered 2022-03-25: 5 mg via ORAL
  Filled 2022-03-25: qty 1

## 2022-03-25 MED ORDER — CYCLOBENZAPRINE HCL 10 MG PO TABS: 10.0000 mg | ORAL_TABLET | Freq: Two times a day (BID) | ORAL | 0 refills | Status: DC | PRN

## 2022-03-25 NOTE — Discharge Instructions (Addendum)
You were seen today for a headache which seems connected to muscle spasms in your neck.  I have ordered a short course of Flexeril.  Please take this as prescribed.  I do recommend following up with your primary care provider after establishing care.  Please contact the number listed in your discharge paperwork for help with finding a provider.  If you develop any life-threatening conditions such as shortness of breath, chest pain, or facial droop, please return to the emergency department

## 2022-03-25 NOTE — ED Triage Notes (Signed)
Pt endorses shoulder, neck pain that is causing headaches. Seen before for same.

## 2022-03-25 NOTE — ED Provider Notes (Signed)
[facility] EMERGENCY DEPARTMENT Provider Note   CSN: [id number] Arrival date & time: 03/25/22  1741     History  Chief Complaint  Patient presents with   Neck Pain   Headache    [name] [name] is a 56 y.o. male.  Patient presents to the hospital complaining of bilateral neck tension and headache.  Patient states he had a similar presentation approximately 1 month ago and was given Flexeril which provided a significant amount of relief.  He denies this being the worst headache of his life.  Denies sudden onset of pain.  States he has increasing tension in his shoulders which leads to headaches.  Past medical history significant for alcohol dependence, GERD, tobacco abuse, cocaine abuse, syncope  HPI     Home Medications Prior to Admission medications   Medication Sig Start Date End Date Taking? Authorizing Provider  cyclobenzaprine (FLEXERIL) 10 MG tablet Take 1 tablet (10 mg total) by mouth 2 (two) times daily as needed for muscle spasms. 03/25/22  Yes Dorothyann Peng, PA-C  ALEVE 220 MG tablet Take 220-440 mg by mouth 2 (two) times daily as needed (for pain).    [provider]  docusate sodium (COLACE) 250 MG capsule Take 1 capsule (250 mg total) by mouth daily. 02/20/21   Valarie Merino, MD  escitalopram (LEXAPRO) 10 MG tablet Take 1 tablet (10 mg total) by mouth daily. 02/07/21   White, Patrice L, NP  gabapentin (NEURONTIN) 300 MG capsule Take 1 capsule (300 mg total) by mouth 3 (three) times daily. 02/07/21   White, Patrice L, NP  hydrocortisone (ANUSOL-HC) 2.5 % rectal cream Place 1 application rectally 2 (two) times daily. 02/20/21   Valarie Merino, MD  hydrOXYzine (ATARAX/VISTARIL) 25 MG tablet Take 1 tablet (25 mg total) by mouth every 6 (six) hours as needed (anxiety). 02/07/21   White, Patrice L, NP  ibuprofen (ADVIL) 200 MG tablet Take 400 mg by mouth every 6 (six) hours as needed (for pain).    [provider]  phenylephrine-shark  liver oil-mineral oil-petrolatum (PREPARATION H) 0.25-14-74.9 % rectal ointment Place 1 application rectally 2 (two) times daily as needed for hemorrhoids.    [provider]  traZODone (DESYREL) 50 MG tablet Take 1 tablet (50 mg total) by mouth at bedtime as needed for sleep. 02/07/21   White, Patrice L, NP  TUMS 500 MG chewable tablet Chew 1-2 tablets by mouth as needed for indigestion or heartburn.    [provider]      Allergies    Aspirin, Pepperoni [pickled meat], and Tomato    Review of Systems   Review of Systems  Musculoskeletal:  Positive for myalgias.  Neurological:  Positive for headaches.    Physical Exam Updated Vital Signs BP 105/65 (BP Location: Right Arm)   Pulse 61   Temp 98.9 F (37.2 C) (Oral)   Resp 18   Ht '5\' 4"'$  (1.626 m)   Wt 51 kg   SpO2 93%   BMI 19.30 kg/m  Physical Exam Vitals and nursing note reviewed.  Constitutional:      General: He is not in acute distress.    Appearance: He is well-developed.  HENT:     Head: Normocephalic and atraumatic.     Mouth/Throat:     Mouth: Mucous membranes are moist.  Eyes:     Extraocular Movements: Extraocular movements intact.     Conjunctiva/sclera: Conjunctivae normal.     Pupils: Pupils are equal, round,  and reactive to light.  Cardiovascular:     Rate and Rhythm: Normal rate and regular rhythm.     Heart sounds: No murmur heard. Pulmonary:     Effort: Pulmonary effort is normal. No respiratory distress.     Breath sounds: Normal breath sounds.  Abdominal:     Palpations: Abdomen is soft.     Tenderness: There is no abdominal tenderness.  Musculoskeletal:        General: No swelling.     Cervical back: Normal range of motion and neck supple. No rigidity.  Lymphadenopathy:     Cervical: No cervical adenopathy.  Skin:    General: Skin is warm and dry.     Capillary Refill: Capillary refill takes less than 2 seconds.  Neurological:     Mental Status: He is alert.  Psychiatric:         Mood and Affect: Mood normal.     ED Results / Procedures / Treatments   Labs (all labs ordered are listed, but only abnormal results are displayed) Labs Reviewed - No data to display  EKG None  Radiology No results found.  Procedures Procedures    Medications Ordered in ED Medications  cyclobenzaprine (FLEXERIL) tablet 5 mg (has no administration in time range)    ED Course/ Medical Decision Making/ A&P                           Medical Decision Making  Patient presents to the hospital chief complaint of headache.  Differential includes tension headache, cluster headache, migraine, meningitis, and others  Reviewed the patient's past medical history including ED notes from a visit on August 14 where he eloped after getting Flexeril.  He states the Flexeril did help significantly with his pain at that time.  I see no indication at this time for labs.  The patient is not complaining of red flag symptoms such as worst headache of his life, sudden onset of pain.  He has no neurologic deficits.  No fever or neck stiffness to suggest meningitis.  I see no indication for imaging at this time  Ordered the patient a Flexeril to help with his tension.  Upon reassessment the patient was feeling better.  The patient's headache is across the front of his head.  It seems consistent with a tension headache.  The patient states that last time he had this the Flexeril helped significantly with pain.  Plan to send a Flexeril prescription to the pharmacy for the patient and discharged home.  Patient states he is not willing to wait in the waiting room and I feel the patient's presentation it is okay to discharge at this time.        Final Clinical Impression(s) / ED Diagnoses Final diagnoses:  Tension headache  Muscle spasm    Rx / DC Orders ED Discharge Orders          Ordered    cyclobenzaprine (FLEXERIL) 10 MG tablet  2 times daily PRN        03/25/22 1842               Dorothyann Peng, PA-C 03/25/22 1843    Elgie Congo, MD 03/25/22 [phone]

## 2022-05-14 ENCOUNTER — Ambulatory Visit: Payer: Medicaid Other | Admitting: Surgery

## 2022-05-29 ENCOUNTER — Other Ambulatory Visit: Payer: Self-pay

## 2022-05-29 ENCOUNTER — Emergency Department (HOSPITAL_COMMUNITY)
Admission: EM | Admit: 2022-05-29 | Discharge: 2022-05-30 | Discharge status: Against Medical Advice | Payer: Medicaid Other | Attending: Emergency Medicine | Admitting: Emergency Medicine

## 2022-05-29 ENCOUNTER — Encounter (HOSPITAL_COMMUNITY): Payer: Self-pay

## 2022-05-29 ENCOUNTER — Emergency Department (HOSPITAL_COMMUNITY): Payer: Medicaid Other

## 2022-05-29 DIAGNOSIS — Z5321 Procedure and treatment not carried out due to patient leaving prior to being seen by health care provider: Secondary | ICD-10-CM | POA: Insufficient documentation

## 2022-05-29 DIAGNOSIS — M542 Cervicalgia: Secondary | ICD-10-CM | POA: Insufficient documentation

## 2022-05-29 DIAGNOSIS — R519 Headache, unspecified: Secondary | ICD-10-CM | POA: Diagnosis not present

## 2022-05-29 DIAGNOSIS — Y92410 Unspecified street and highway as the place of occurrence of the external cause: Secondary | ICD-10-CM | POA: Insufficient documentation

## 2022-05-29 MED ORDER — METHOCARBAMOL 500 MG PO TABS
1000.0000 mg | ORAL_TABLET | Freq: Once | ORAL | Status: AC
  Administered 2022-05-29: 1000 mg via ORAL
  Filled 2022-05-29: qty 2
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
## 2022-06-09 ENCOUNTER — Encounter (HOSPITAL_COMMUNITY): Payer: Self-pay

## 2022-06-09 ENCOUNTER — Other Ambulatory Visit: Payer: Self-pay

## 2022-06-09 ENCOUNTER — Emergency Department (HOSPITAL_COMMUNITY): Payer: Commercial Managed Care - HMO

## 2022-06-09 ENCOUNTER — Emergency Department (HOSPITAL_COMMUNITY)
Admission: EM | Admit: 2022-06-09 | Discharge: 2022-06-09 | Disposition: A | Discharge status: Home / Self Care | Payer: Commercial Managed Care - HMO | Attending: Emergency Medicine | Admitting: Emergency Medicine

## 2022-06-09 DIAGNOSIS — N400 Enlarged prostate without lower urinary tract symptoms: Secondary | ICD-10-CM | POA: Insufficient documentation

## 2022-06-09 DIAGNOSIS — M542 Cervicalgia: Secondary | ICD-10-CM | POA: Insufficient documentation

## 2022-06-09 DIAGNOSIS — R079 Chest pain, unspecified: Secondary | ICD-10-CM | POA: Diagnosis not present

## 2022-06-09 DIAGNOSIS — R1013 Epigastric pain: Secondary | ICD-10-CM | POA: Diagnosis not present

## 2022-06-09 DIAGNOSIS — R110 Nausea: Secondary | ICD-10-CM | POA: Insufficient documentation

## 2022-06-09 DIAGNOSIS — I2510 Atherosclerotic heart disease of native coronary artery without angina pectoris: Secondary | ICD-10-CM | POA: Insufficient documentation

## 2022-06-09 DIAGNOSIS — K219 Gastro-esophageal reflux disease without esophagitis: Secondary | ICD-10-CM | POA: Diagnosis not present

## 2022-06-09 DIAGNOSIS — R0602 Shortness of breath: Secondary | ICD-10-CM | POA: Insufficient documentation

## 2022-06-09 DIAGNOSIS — R1010 Upper abdominal pain, unspecified: Secondary | ICD-10-CM | POA: Diagnosis present

## 2022-06-09 LAB — COMPREHENSIVE METABOLIC PANEL
ALT: 11 U/L (ref 0–44)
AST: 14 U/L — ABNORMAL LOW (ref 15–41)
Albumin: 3.5 g/dL (ref 3.5–5.0)
Alkaline Phosphatase: 49 U/L (ref 38–126)
Anion gap: 6 (ref 5–15)
BUN: 10 mg/dL (ref 6–20)
CO2: 24 mmol/L (ref 22–32)
Calcium: 8.2 mg/dL — ABNORMAL LOW (ref 8.9–10.3)
Chloride: 103 mmol/L (ref 98–111)
Creatinine, Ser: 1.03 mg/dL (ref 0.61–1.24)
GFR, Estimated: >60 mL/min (ref >60)
Glucose, Bld: 89 mg/dL (ref 70–99)
Potassium: 3.6 mmol/L (ref 3.5–5.1)
Sodium: 133 mmol/L — ABNORMAL LOW (ref 135–145)
Total Bilirubin: 0.5 mg/dL (ref 0.3–1.2)
Total Protein: 6.3 g/dL — ABNORMAL LOW (ref 6.5–8.1)

## 2022-06-09 LAB — CBC WITH DIFFERENTIAL/PLATELET
Abs Immature Granulocytes: 0.01 10*3/uL (ref 0.00–0.07)
Basophils Absolute: 0 10*3/uL (ref 0.0–0.1)
Basophils Relative: 0 %
Eosinophils Absolute: 0.1 10*3/uL (ref 0.0–0.5)
Eosinophils Relative: 2 %
HCT: 38.9 % — ABNORMAL LOW (ref 39.0–52.0)
Hemoglobin: 12.8 g/dL — ABNORMAL LOW (ref 13.0–17.0)
Immature Granulocytes: 0 %
Lymphocytes Relative: 31 %
Lymphs Abs: 1.8 10*3/uL (ref 0.7–4.0)
MCH: 30.5 pg (ref 26.0–34.0)
MCHC: 32.9 g/dL (ref 30.0–36.0)
MCV: 92.8 fL (ref 80.0–100.0)
Monocytes Absolute: 0.6 10*3/uL (ref 0.1–1.0)
Monocytes Relative: 11 %
Neutro Abs: 3.1 10*3/uL (ref 1.7–7.7)
Neutrophils Relative %: 56 %
Platelets: 228 10*3/uL (ref 150–400)
RBC: 4.19 MIL/uL — ABNORMAL LOW (ref 4.22–5.81)
RDW: 13.2 % (ref 11.5–15.5)
WBC: 5.6 10*3/uL (ref 4.0–10.5)
nRBC: 0 % (ref 0.0–0.2)

## 2022-06-09 LAB — TROPONIN I (HIGH SENSITIVITY)
Troponin I (High Sensitivity): 3 ng/L (ref <18)
Troponin I (High Sensitivity): <2 ng/L (ref <18)

## 2022-06-09 LAB — LIPASE, BLOOD: Lipase: 33 U/L (ref 11–51)

## 2022-06-09 MED ORDER — PANTOPRAZOLE SODIUM 20 MG PO TBEC: 20.0000 mg | DELAYED_RELEASE_TABLET | Freq: Every day | ORAL | 1 refills | Status: AC

## 2022-06-09 MED ORDER — ONDANSETRON HCL 4 MG/2ML IJ SOLN
4.0000 mg | Freq: Once | INTRAMUSCULAR | Status: AC
  Administered 2022-06-09: 4 mg via INTRAVENOUS
  Filled 2022-06-09: qty 2

## 2022-06-09 MED ORDER — SUCRALFATE 1 G PO TABS
1.0000 g | ORAL_TABLET | Freq: Once | ORAL | Status: AC
  Administered 2022-06-09: 1 g via ORAL
  Filled 2022-06-09: qty 1

## 2022-06-09 MED ORDER — SODIUM CHLORIDE 0.9 % IV BOLUS
1000.0000 mL | Freq: Once | INTRAVENOUS | Status: AC
  Administered 2022-06-09: 1000 mL via INTRAVENOUS

## 2022-06-09 MED ORDER — IOHEXOL 350 MG/ML SOLN
75.0000 mL | Freq: Once | INTRAVENOUS | Status: AC | PRN
  Administered 2022-06-09: 75 mL via INTRAVENOUS

## 2022-06-09 MED ORDER — HYDROMORPHONE HCL 1 MG/ML IJ SOLN
0.5000 mg | Freq: Once | INTRAMUSCULAR | Status: AC
  Administered 2022-06-09: 0.5 mg via INTRAVENOUS
  Filled 2022-06-09: qty 1

## 2022-06-09 MED ORDER — SUCRALFATE 1 G PO TABS: 1.0000 g | ORAL_TABLET | Freq: Three times a day (TID) | ORAL | 1 refills | Status: DC

## 2022-06-09 MED ORDER — PANTOPRAZOLE SODIUM 40 MG IV SOLR
40.0000 mg | Freq: Once | INTRAVENOUS | Status: AC
  Administered 2022-06-09: 40 mg via INTRAVENOUS
  Filled 2022-06-09: qty 10

## 2022-06-09 MED ORDER — FAMOTIDINE 20 MG PO TABS: 20.0000 mg | ORAL_TABLET | Freq: Two times a day (BID) | ORAL | 0 refills | Status: DC

## 2022-06-09 NOTE — ED Notes (Addendum)
Pt ambulated well . PT complained of chest pain increase when started to ambulate. Pulse maintained the same .

## 2022-06-09 NOTE — Discharge Instructions (Addendum)
Please read and follow all provided instructions.  Your diagnoses today include:  1. Epigastric abdominal pain     Tests performed today include: An EKG of your heart Cardiac enzymes: Did not show any signs of stress on your heart Complete blood cell count: No concerns Complete metabolic panel: No concerns Lipase (pancreas function test): Was negative CT scan of the abdomen pelvis: Showed enlargement of the prostate which will need to be rechecked by PCP or the urology referral provided. Vital signs. See below for your results today.   Medications prescribed:  Carafate - for stomach upset and to protect your stomach  Pantoprazole (Protonix) - stomach acid reducer  Pepcid (famotidine) - antihistamine  You can find this medication over-the-counter.   DO NOT exceed:  '20mg'$  Pepcid every 12 hours  Take any prescribed medications only as directed.  Follow-up instructions: Please follow-up with your primary care provider as soon as you can for further evaluation of your symptoms.   You have also been given a referral to a gastroenterologist to evaluate for upper abdominal and lower chest pain as well as a urologist to follow-up on the enlarged/abnormal appearing prostate seen on CT scan.  Return instructions:  SEEK IMMEDIATE MEDICAL ATTENTION IF: You have severe chest pain, especially if the pain is crushing or pressure-like and spreads to the arms, back, neck, or jaw, or if you have sweating, nausea or vomiting, or trouble with breathing. THIS IS AN EMERGENCY. Do not wait to see if the pain will go away. Get medical help at once. Call 911. DO NOT drive yourself to the hospital.  Your chest pain gets worse and does not go away after a few minutes of rest.  You have an attack of chest pain lasting longer than what you usually experience.  You have significant dizziness, if you pass out, or have trouble walking.  You have chest pain not typical of your usual pain for which you  originally saw your caregiver.  You have any other emergent concerns regarding your health.  Additional Information: Chest pain comes from many different causes. Your caregiver has diagnosed you as having chest pain that is not specific for one problem, but does not require admission.  You are at low risk for an acute heart condition or other serious illness.   Your vital signs today were: BP 120/83   Pulse (!) 50   Temp 98 F (36.7 C)   Resp (!) 32   SpO2 100%  If your blood pressure (BP) was elevated above 135/85 this visit, please have this repeated by your doctor within one month. --------------

## 2022-06-09 NOTE — ED Triage Notes (Signed)
Patient reports CP over a few days and called EMS from home today. Patient reports that he pain starts from stomach and goes to chest. Alert and oriented. HX of cardiac bypass, no hx of medication use. HR 45-50 with ems BP 104/60 provided 355m IVF

## 2022-06-09 NOTE — ED Notes (Signed)
Patient transported to CT 

## 2022-06-09 NOTE — ED Notes (Signed)
Patient transported to X-ray 
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
## 2022-06-16 ENCOUNTER — Ambulatory Visit (INDEPENDENT_AMBULATORY_CARE_PROVIDER_SITE_OTHER): Payer: Medicaid Other | Admitting: Urology

## 2022-06-16 ENCOUNTER — Encounter: Payer: Self-pay | Admitting: Urology

## 2022-06-16 VITALS — BP 127/83 | HR 48 | Ht 64.0 in | Wt 115.0 lb

## 2022-06-16 DIAGNOSIS — Z125 Encounter for screening for malignant neoplasm of prostate: Secondary | ICD-10-CM

## 2022-06-16 DIAGNOSIS — N400 Enlarged prostate without lower urinary tract symptoms: Secondary | ICD-10-CM | POA: Diagnosis not present

## 2022-06-16 LAB — URINALYSIS
Bilirubin, UA: NEGATIVE
Blood, UA: NEGATIVE
Glucose, UA: NEGATIVE mg/dL
Ketones, POC UA: NEGATIVE mg/dL
Leukocytes, UA: NEGATIVE
Nitrite, UA: NEGATIVE
Protein Ur, POC: NEGATIVE mg/dL
Spec Grav, UA: 1.025 (ref 1.010–1.025)
Urobilinogen, UA: 0.2 E.U./dL
pH, UA: 6.5 (ref 5.0–8.0)

## 2022-06-16 NOTE — Progress Notes (Signed)
Assessment: 1. BPH without obstruction/lower urinary tract symptoms   2. Prostate cancer screening     Plan: I personally reviewed the patient's chart including provider notes, lab results, and imaging results. I personally reviewed the CT study from 06/09/2022 with results as noted below. PSA today Will call with results and to arrange next visit.   Chief Complaint:  Chief Complaint  Patient presents with   abnormal CT of prostate    History of Present Illness:  [name] [name] is a 57 y.o. male who is seen in consultation from Demetrios Isaacs, FNP for evaluation of abnormal CT of prostate. He was recently seen in the emergency department on 06/09/2022 for evaluation of chest and abdominal pain.  CT abdomen and pelvis with contrast was obtained and showed no renal lesions or obstruction, collapsed urinary bladder, heterogeneous lobular enhancement involving the right side of the prostate. No dysuria or gross hematuria.  He reports symptoms of occasional intermittent stream, sensation of incomplete emptying, frequency, and urgency.  No recent change in his urinary symptoms.  No history of UTIs or prostatitis.  No history of kidney stones. IPSS = 11 today. No weight loss or bone pain.  He reports a good appetite.  No change in his bowel movements.  Past Medical History:  Past Medical History:  Diagnosis Date   Abnormal ECG    a. early repolarization   Arthritis    "my whole left side" (05/03/2014)   Bipolar disorder (HCC)    Bradycardia    a. asymptomatic   CAD in native artery    a. Nonobstructive cath 11/2007;  b. Presented with ST elevation - Nonobstructive cath 08/2011   Chest pain, mid sternal    Coronary artery disease    GERD (gastroesophageal reflux disease)    History of cocaine abuse ([place])    a. quit ? 2009   History of ETOH abuse    a. drinks 2 "40's" / wk   Marijuana abuse    a. uses ~ 1x /wk or less   Pneumonia    Stomach ulcer    Syncope    a. 12/2010 -  presumed to be vasovagal   Tobacco abuse     Past Surgical History:  Past Surgical History:  Procedure Laterality Date   CARDIAC CATHETERIZATION  2009; 08/2011   [name] 08/06/2011   LEFT HEART CATHETERIZATION WITH CORONARY ANGIOGRAM N/A 08/16/2011   Procedure: LEFT HEART CATHETERIZATION WITH CORONARY ANGIOGRAM;  Surgeon: [name], MD;  Location: [facility] CATH LAB;  Service: Cardiovascular;  Laterality: N/A;    Allergies:  Allergies  Allergen Reactions   Aspirin Other (See Comments)    Aggravates ulcers and "causes chest pain"   Pepperoni [Pickled Meat] Other (See Comments)    Aggravates ulcers   Tomato Other (See Comments)    Foods with tomato sauce aggravate ulcers    Family History:  No family history on file.  Social History:  Social History   Tobacco Use   Smoking status: Some Days    Packs/day: 0.25    Years: 2.00    Total pack years: 0.50    Types: Cigarettes    Last attempt to quit: 06/24/2015    Years since quitting: 6.9   Smokeless tobacco: Never  Vaping Use   Vaping Use: Never used  Substance Use Topics   Alcohol use: Yes    Alcohol/week: 48.0 standard drinks of alcohol    Types: 48 Cans of beer per week    Comment:  2 shot bottles yesterday   Drug use: Yes    Types: Marijuana, Cocaine    Comment: Crack one year ago and marijuana was yesterday    Review of symptoms:  Constitutional:  Negative for unexplained weight loss, night sweats, fever, chills ENT:  Negative for nose bleeds, sinus pain, painful swallowing CV:  Negative for chest pain, shortness of breath, exercise intolerance, palpitations, loss of consciousness Resp:  Negative for cough, wheezing, shortness of breath GI:  Negative for nausea, vomiting, diarrhea, bloody stools GU:  Positives noted in HPI; otherwise negative for gross hematuria, dysuria, urinary incontinence Neuro:  Negative for seizures, poor balance, limb weakness, slurred speech Psych:  Negative for lack of energy,  depression, anxiety Endocrine:  Negative for polydipsia, polyuria, symptoms of hypoglycemia (dizziness, hunger, sweating) Hematologic:  Negative for anemia, purpura, petechia, prolonged or excessive bleeding, use of anticoagulants  Allergic:  Negative for difficulty breathing or choking as a result of exposure to anything; no shellfish allergy; no allergic response (rash/itch) to materials, foods  Physical exam: BP 127/83   Pulse (!) 48   Ht '5\' 4"'$  (1.626 m)   Wt 115 lb (52.2 kg)   BMI 19.74 kg/m  GENERAL APPEARANCE:  Well appearing, well developed, well nourished, NAD HEENT: Atraumatic, Normocephalic, oropharynx clear. NECK: Supple without lymphadenopathy or thyromegaly. LUNGS: Clear to auscultation bilaterally. HEART: Regular Rate and Rhythm without murmurs, gallops, or rubs. ABDOMEN: Soft, non-tender, No Masses. EXTREMITIES: Moves all extremities well.  Without clubbing, cyanosis, or edema. NEUROLOGIC:  Alert and oriented x 3, normal gait, CN II-XII grossly intact.  MENTAL STATUS:  Appropriate. BACK:  Non-tender to palpation.  No CVAT SKIN:  Warm, dry and intact.  GU: Penis:  circumcised Meatus: Normal Scrotum: normal, no masses Testis: normal without masses bilateral Epididymis: normal Prostate: 40 g, NT, no nodules Rectum: Normal tone,  no masses or tenderness    Results: U/A dipstick:  negative

## 2022-06-17 LAB — PSA: Prostate Specific Ag, Serum: 29.4 ng/mL — ABNORMAL HIGH (ref 0.0–4.0)

## 2022-06-18 ENCOUNTER — Other Ambulatory Visit: Payer: Self-pay

## 2022-06-18 DIAGNOSIS — R972 Elevated prostate specific antigen [PSA]: Secondary | ICD-10-CM

## 2022-07-13 NOTE — Patient Instructions (Signed)
Prostate Biopsy Instructions  Stop all aspirin or blood thinners (aspirin, plavix, coumadin, warfarin, motrin, ibuprofen, advil, aleve, naproxen, naprosyn) for 7 days prior to the procedure.  If you have any questions about stopping these medications, please contact your primary care physician or cardiologist.  Having a light meal prior to the procedure is recommended.  If you are diabetic or have low blood sugar please bring a small snack or glucose tablet.  A Fleets enema is needed and can be purchased over the counter at a local pharmacy. This will need to be administered 2 hours prior to your procedure. Antibiotics will be administered in the clinic at the time of the procedure unless otherwise specified.    If you have any questions or concerns, please feel free to call the office at [phone] or send a Mychart message.   Thank you,  Staff at [facility]  

## 2022-07-15 ENCOUNTER — Telehealth: Payer: Medicaid Other | Admitting: Emergency Medicine

## 2022-07-15 DIAGNOSIS — R933 Abnormal findings on diagnostic imaging of other parts of digestive tract: Secondary | ICD-10-CM

## 2022-07-15 DIAGNOSIS — R1013 Epigastric pain: Secondary | ICD-10-CM | POA: Diagnosis not present

## 2022-07-15 MED ORDER — SUCRALFATE 1 G PO TABS: 1.0000 g | ORAL_TABLET | Freq: Three times a day (TID) | ORAL | 1 refills | Status: AC

## 2022-07-15 MED ORDER — FAMOTIDINE 20 MG PO TABS: 20.0000 mg | ORAL_TABLET | Freq: Two times a day (BID) | ORAL | 0 refills | Status: AC

## 2022-07-15 NOTE — Progress Notes (Signed)
Virtual Visit Consent   [name] [name], you are scheduled for a virtual visit with a [facility] provider today. Just as with appointments in the office, your consent must be obtained to participate. Your consent will be active for this visit and any virtual visit you may have with one of our providers in the next 365 days. If you have a MyChart account, a copy of this consent can be sent to you electronically.  As this is a virtual visit, video technology does not allow for your provider to perform a traditional examination. This may limit your provider's ability to fully assess your condition. If your provider identifies any concerns that need to be evaluated in person or the need to arrange testing (such as labs, EKG, etc.), we will make arrangements to do so. Although advances in technology are sophisticated, we cannot ensure that it will always work on either your end or our end. If the connection with a video visit is poor, the visit may have to be switched to a telephone visit. With either a video or telephone visit, we are not always able to ensure that we have a secure connection.  By engaging in this virtual visit, you consent to the provision of healthcare and authorize for your insurance to be billed (if applicable) for the services provided during this visit. Depending on your insurance coverage, you may receive a charge related to this service.  I need to obtain your verbal consent now. Are you willing to proceed with your visit today? BRIDGE MARZ has provided verbal consent on 07/16/2022 for a virtual visit (video or telephone). Carvel Getting, NP  Date: 07/16/2022 8:46 AM  Virtual Visit via Video Note   I, Carvel Getting, connected with  [name] [name]  ([id number], 1966-02-18) on 07/16/22 at 12:00 PM EST by a video-enabled telemedicine application and verified that I am speaking with the correct person using two identifiers. Interactive audio and video communications were attempted,  although failed due to patient's inability to connect to video. Continued visit with audio only interaction with patient agreement.  Location: Patient: Virtual Visit Location Patient: Other: work Provider: Scientist, research (medical) Provider: Home   I discussed the limitations of evaluation and management by telemedicine and the availability of in person appointments. The patient expressed understanding and agreed to proceed.    History of Present Illness: [name] [name] is a 57 y.o. who identifies as a male who was assigned male at birth, and is being seen today for follow up on radiology results. He has a PCP but has been unable to get an appointment with her since his ED visit 06/09/22. During his ED visit that date, he had a CT scan of his abd/pelvis. He received a letter from radiology recommending he get f/u for abnormal results. Review of CT results showed interval mild dilatation of pancreatic duct of uncertain etiology; dedicated workup when appropriate such as MRCP. CT results also showed prostate abnormality - pt has seen urology and is scheduled for prostate biopsy this month. I do not see where GI f/u has been arranged for pancreatic duct changes.   Pt went to ED for long standing intermittent upper abd pain. He was discharged on famotidine, pantoprazole, and sucralfate.  He reports feeling very well while taking these meds and notes that he is out now, requests refills. Repeats that he has tried to get appt with PCP but has not been able to get one. He has also tried  to eat carefully and feels this has helped reduce his abd pain.    HPI: HPI  Problems:  Patient Active Problem List   Diagnosis Date Noted   BPH without obstruction/lower urinary tract symptoms 06/16/2022   MDD (major depressive disorder), recurrent episode, severe ([place]) 03/24/2019   MDD (major depressive disorder), recurrent severe, without psychosis ([place]) 02/08/2018   Major depressive disorder, recurrent episode, severe  with anxious distress ([place]) 05/23/2016   PTSD (post-traumatic stress disorder) 05/23/2016   Cocaine use disorder, moderate, in early remission ([place]) 05/23/2016   Alcohol use disorder, moderate, dependence ([place]) 05/23/2016   Affective psychosis, bipolar ([place]) 04/30/2016   Cocaine abuse with cocaine-induced mood disorder ([place]) 03/04/2016   Alcohol dependence with uncomplicated withdrawal ([place])    Alcohol use disorder, severe, dependence ([place]) 01/18/2015   Alcohol dependence with alcohol-induced mood disorder ([place])    Suicidal ideation    Acute pancreatitis 03/20/2012   Polysubstance abuse ([place]) 03/20/2012   Cannabis abuse 01/12/2012   Alcohol dependence ([place]) 10/15/2011   Abnormal ECG    Chest pain    CAD in native artery    GERD (gastroesophageal reflux disease)    Tobacco abuse    History of cocaine abuse ([place])    History of ETOH abuse    Bradycardia    Syncope     Allergies:  Allergies  Allergen Reactions   Aspirin Other (See Comments)    Aggravates ulcers and "causes chest pain"   Pepperoni [Pickled Meat] Other (See Comments)    Aggravates ulcers   Tomato Other (See Comments)    Foods with tomato sauce aggravate ulcers   Medications:  Current Outpatient Medications:    cyclobenzaprine (FLEXERIL) 10 MG tablet, Take 1 tablet (10 mg total) by mouth 2 (two) times daily as needed for muscle spasms. (Patient not taking: Reported on 06/16/2022), Disp: 20 tablet, Rfl: 0   docusate sodium (COLACE) 250 MG capsule, Take 1 capsule (250 mg total) by mouth daily. (Patient not taking: Reported on 06/16/2022), Disp: 10 capsule, Rfl: 0   escitalopram (LEXAPRO) 10 MG tablet, Take 1 tablet (10 mg total) by mouth daily. (Patient not taking: Reported on 06/16/2022), Disp: 30 tablet, Rfl: 0   famotidine (PEPCID) 20 MG tablet, Take 1 tablet (20 mg total) by mouth 2 (two) times daily., Disp: 60 tablet, Rfl: 0   gabapentin (NEURONTIN) 300 MG capsule, Take 1 capsule (300 mg total) by mouth 3 (three)  times daily. (Patient not taking: Reported on 06/16/2022), Disp: 90 capsule, Rfl: 0   hydrocortisone (ANUSOL-HC) 2.5 % rectal cream, Place 1 application rectally 2 (two) times daily. (Patient not taking: Reported on 06/16/2022), Disp: 30 g, Rfl: 0   hydrOXYzine (ATARAX/VISTARIL) 25 MG tablet, Take 1 tablet (25 mg total) by mouth every 6 (six) hours as needed (anxiety). (Patient not taking: Reported on 06/16/2022), Disp: 30 tablet, Rfl: 0   pantoprazole (PROTONIX) 20 MG tablet, Take 1 tablet (20 mg total) by mouth daily., Disp: 30 tablet, Rfl: 1   phenylephrine-shark liver oil-mineral oil-petrolatum (PREPARATION H) 0.25-14-74.9 % rectal ointment, Place 1 application rectally 2 (two) times daily as needed for hemorrhoids. (Patient not taking: Reported on 06/16/2022), Disp: , Rfl:    sucralfate (CARAFATE) 1 g tablet, Take 1 tablet (1 g total) by mouth 4 (four) times daily -  with meals and at bedtime., Disp: 60 tablet, Rfl: 1   traZODone (DESYREL) 50 MG tablet, Take 1 tablet (50 mg total) by mouth at bedtime as needed for sleep. (Patient not  taking: Reported on 06/16/2022), Disp: 30 tablet, Rfl: 0   TUMS 500 MG chewable tablet, Chew 1-2 tablets by mouth as needed for indigestion or heartburn. (Patient not taking: Reported on 06/16/2022), Disp: , Rfl:   Observations/Objective: Patient is in no acute distress.  No labored breathing.  Speech is clear and coherent with logical content.  Patient is alert and oriented at baseline.    Assessment and Plan: 1. Epigastric pain  2. Abnormal finding on GI tract imaging  I will place a new referral for GI. I will also route this note to his PCP so hopefully they can help him get f/u there.   We discussed refilling his medications - I am sorry he's had such trouble seeing his PCP. I elected to refill H2 blocker and carafate - I will leave whether or not to use PPI to GI when he sees them.   Follow Up Instructions: I discussed the assessment and treatment plan  with the patient. The patient was provided an opportunity to ask questions and all were answered. The patient agreed with the plan and demonstrated an understanding of the instructions.  A copy of instructions were sent to the patient via MyChart unless otherwise noted below.    The patient was advised to call back or seek an in-person evaluation if the symptoms worsen or if the condition fails to improve as anticipated.  Time:  I spent 10 minutes with the patient via telehealth technology discussing the above problems/concerns.    Carvel Getting, NP

## 2022-07-16 NOTE — Patient Instructions (Signed)
[name] [name], thank you for joining Carvel Getting, NP for today's virtual visit.  While this provider is not your primary care provider (PCP), if your PCP is located in our provider database this encounter information will be shared with them immediately following your visit.   [facility] account gives you access to today's visit and all your visits, tests, and labs performed at [facility] " click here if you don't have a [facility] account or go to [email or web address]  Consent: (Patient) [name] [name] provided verbal consent for this virtual visit at the beginning of the encounter.  Current Medications:  Current Outpatient Medications:    cyclobenzaprine (FLEXERIL) 10 MG tablet, Take 1 tablet (10 mg total) by mouth 2 (two) times daily as needed for muscle spasms. (Patient not taking: Reported on 06/16/2022), Disp: 20 tablet, Rfl: 0   docusate sodium (COLACE) 250 MG capsule, Take 1 capsule (250 mg total) by mouth daily. (Patient not taking: Reported on 06/16/2022), Disp: 10 capsule, Rfl: 0   escitalopram (LEXAPRO) 10 MG tablet, Take 1 tablet (10 mg total) by mouth daily. (Patient not taking: Reported on 06/16/2022), Disp: 30 tablet, Rfl: 0   famotidine (PEPCID) 20 MG tablet, Take 1 tablet (20 mg total) by mouth 2 (two) times daily., Disp: 60 tablet, Rfl: 0   gabapentin (NEURONTIN) 300 MG capsule, Take 1 capsule (300 mg total) by mouth 3 (three) times daily. (Patient not taking: Reported on 06/16/2022), Disp: 90 capsule, Rfl: 0   hydrocortisone (ANUSOL-HC) 2.5 % rectal cream, Place 1 application rectally 2 (two) times daily. (Patient not taking: Reported on 06/16/2022), Disp: 30 g, Rfl: 0   hydrOXYzine (ATARAX/VISTARIL) 25 MG tablet, Take 1 tablet (25 mg total) by mouth every 6 (six) hours as needed (anxiety). (Patient not taking: Reported on 06/16/2022), Disp: 30 tablet, Rfl: 0   pantoprazole (PROTONIX) 20 MG tablet, Take 1 tablet (20 mg total) by mouth daily.,  Disp: 30 tablet, Rfl: 1   phenylephrine-shark liver oil-mineral oil-petrolatum (PREPARATION H) 0.25-14-74.9 % rectal ointment, Place 1 application rectally 2 (two) times daily as needed for hemorrhoids. (Patient not taking: Reported on 06/16/2022), Disp: , Rfl:    sucralfate (CARAFATE) 1 g tablet, Take 1 tablet (1 g total) by mouth 4 (four) times daily -  with meals and at bedtime., Disp: 60 tablet, Rfl: 1   traZODone (DESYREL) 50 MG tablet, Take 1 tablet (50 mg total) by mouth at bedtime as needed for sleep. (Patient not taking: Reported on 06/16/2022), Disp: 30 tablet, Rfl: 0   TUMS 500 MG chewable tablet, Chew 1-2 tablets by mouth as needed for indigestion or heartburn. (Patient not taking: Reported on 06/16/2022), Disp: , Rfl:    Medications ordered in this encounter:  Meds ordered this encounter  Medications   famotidine (PEPCID) 20 MG tablet    Sig: Take 1 tablet (20 mg total) by mouth 2 (two) times daily.    Dispense:  60 tablet    Refill:  0   sucralfate (CARAFATE) 1 g tablet    Sig: Take 1 tablet (1 g total) by mouth 4 (four) times daily -  with meals and at bedtime.    Dispense:  60 tablet    Refill:  1     *If you need refills on other medications prior to your next appointment, please contact your pharmacy*  Follow-Up: Call back or seek an in-person evaluation if the symptoms worsen or if the condition fails to improve as anticipated.  [facility] [phone]  Other Instructions I have placed a new referral to GI for you. I have refilled 2 medications, famotidine and sucralfate. Please keep trying to get an appointment with your primary care provider Ms. Linton Rump.    If you have been instructed to have an in-person evaluation today at a local Urgent Care facility, please use the link below. It will take you to a list of all of our available [facility], including address, phone number and hours of operation. Please do not delay care.  [facility]  If you or a family member do not have a primary care provider, use the link below to schedule a visit and establish care. When you choose a [facility] primary care physician or advanced practice provider, you gain a long-term partner in health. Find a Primary Care Provider  Learn more about [facility]'s in-office and virtual care options: [facility]

## 2022-07-20 ENCOUNTER — Ambulatory Visit (HOSPITAL_BASED_OUTPATIENT_CLINIC_OR_DEPARTMENT_OTHER): Payer: Medicaid Other

## 2022-07-20 ENCOUNTER — Other Ambulatory Visit: Payer: Medicaid Other | Admitting: Urology

## 2022-07-20 ENCOUNTER — Other Ambulatory Visit (HOSPITAL_BASED_OUTPATIENT_CLINIC_OR_DEPARTMENT_OTHER): Payer: Self-pay

## 2022-07-20 NOTE — Progress Notes (Deleted)
Assessment: 1. Elevated PSA     Plan: Post biopsy instructions given Return to office in 7-10 days for biopsy results  Chief Complaint:  No chief complaint on file.   History of Present Illness:  [name] [name] is a 57 y.o. male who is seen for further evaluation of elevated PSA and abnormal CT of prostate. He was seen in the emergency department on 06/09/2022 for evaluation of chest and abdominal pain.  CT abdomen and pelvis with contrast was obtained and showed no renal lesions or obstruction, collapsed urinary bladder, heterogeneous lobular enhancement involving the right side of the prostate. No dysuria or gross hematuria.  He reported symptoms of occasional intermittent stream, sensation of incomplete emptying, frequency, and urgency.  No recent changes in his urinary symptoms.  No history of UTIs or prostatitis.  No history of kidney stones. IPSS = 11. No weight loss or bone pain.  He reports a good appetite.  No change in his bowel movements. PSA 1/24:  29.4.  He presents today for prostate biopsy.  Portions of the above documentation were copied from a prior visit for review purposes only.   Past Medical History:  Past Medical History:  Diagnosis Date   Abnormal ECG    a. early repolarization   Arthritis    "my whole left side" (05/03/2014)   Bipolar disorder (HCC)    Bradycardia    a. asymptomatic   CAD in native artery    a. Nonobstructive cath 11/2007;  b. Presented with ST elevation - Nonobstructive cath 08/2011   Chest pain, mid sternal    Coronary artery disease    GERD (gastroesophageal reflux disease)    History of cocaine abuse ([place])    a. quit ? 2009   History of ETOH abuse    a. drinks 2 "40's" / wk   Marijuana abuse    a. uses ~ 1x /wk or less   Pneumonia    Stomach ulcer    Syncope    a. 12/2010 - presumed to be vasovagal   Tobacco abuse     Past Surgical History:  Past Surgical History:  Procedure Laterality Date   CARDIAC CATHETERIZATION   2009; 08/2011   [name] 08/06/2011   LEFT HEART CATHETERIZATION WITH CORONARY ANGIOGRAM N/A 08/16/2011   Procedure: LEFT HEART CATHETERIZATION WITH CORONARY ANGIOGRAM;  Surgeon: [name], MD;  Location: [facility] CATH LAB;  Service: Cardiovascular;  Laterality: N/A;    Allergies:  Allergies  Allergen Reactions   Aspirin Other (See Comments)    Aggravates ulcers and "causes chest pain"   Pepperoni [Pickled Meat] Other (See Comments)    Aggravates ulcers   Tomato Other (See Comments)    Foods with tomato sauce aggravate ulcers    Family History:  No family history on file.  Social History:  Social History   Tobacco Use   Smoking status: Some Days    Packs/day: 0.25    Years: 2.00    Total pack years: 0.50    Types: Cigarettes    Last attempt to quit: 06/24/2015    Years since quitting: 7.0   Smokeless tobacco: Never  Vaping Use   Vaping Use: Never used  Substance Use Topics   Alcohol use: Yes    Alcohol/week: 48.0 standard drinks of alcohol    Types: 48 Cans of beer per week    Comment: 2 shot bottles yesterday   Drug use: Yes    Types: Marijuana, Cocaine    Comment: Crack one  year ago and marijuana was yesterday    ROS: Constitutional:  Negative for fever, chills, weight loss CV: Negative for chest pain, previous MI, hypertension Respiratory:  Negative for shortness of breath, wheezing, sleep apnea, frequent cough GI:  Negative for nausea, vomiting, bloody stool, GERD  Physical exam: There were no vitals taken for this visit. GENERAL APPEARANCE:  Well appearing, well developed, well nourished, NAD HEENT:  Atraumatic, normocephalic, oropharynx clear NECK:  Supple without lymphadenopathy or thyromegaly ABDOMEN:  Soft, non-tender, no masses EXTREMITIES:  Moves all extremities well, without clubbing, cyanosis, or edema NEUROLOGIC:  Alert and oriented x 3, normal gait, CN II-XII grossly intact MENTAL STATUS:  appropriate BACK:  Non-tender to palpation, No  CVAT SKIN:  Warm, dry, and intact    Results: U/A dipstick:    TRANSRECTAL ULTRASOUND AND PROSTATE BIOPSY  Indication:  {TRUSBX indications:26398}  Prophylactic antibiotic administration: {TRUS antibiotics:26399}  All medications that could result in increased bleeding were discontinued within an appropriate period of the time of biopsy.  Risk including bleeding and infection were discussed.  Informed consent was obtained.  The patient was placed in the left lateral decubitus position.  PROCEDURE 1.  TRANSRECTAL ULTRASOUND OF THE PROSTATE  The 7 MHz transrectal probe was used to image the prostate.  Anal stenosis {WAS/WAS NOT:[phone]::"was not"} noted.  TRUS volume: *** ml  Hypoechoic areas: {prostate anatomy:26400}  Hyperechoic areas: {prostate anatomy:26400}  Central calcifications: {DESC; PRESENT/NOT PRESENT:21021351}  Margins:  {Normal/Abnormal Appearance:21344::"normal"}   PROCEDURE 2:  PROSTATE BIOPSY  A periprostatic block was performed using 1% lidocaine and transrectal ultrasound guidance. Under transrectal ultrasound guidance, and using the Biopty gun, prostate biopsies were obtained systematically from the apex, mid gland, and base bilaterally.  A total of ***cores were obtained.  Hemostasis was obtained with gentle pressure on the prostate.  The procedures were well-tolerated.  No significant bleeding was noted at the end of the procedure.  The patient was stable for discharge from the office.

## 2022-07-22 ENCOUNTER — Ambulatory Visit (INDEPENDENT_AMBULATORY_CARE_PROVIDER_SITE_OTHER): Payer: Medicaid Other | Admitting: Urology

## 2022-07-22 ENCOUNTER — Encounter: Payer: Self-pay | Admitting: Urology

## 2022-07-22 ENCOUNTER — Ambulatory Visit (HOSPITAL_BASED_OUTPATIENT_CLINIC_OR_DEPARTMENT_OTHER)
Admission: RE | Admit: 2022-07-22 | Discharge: 2022-07-22 | Disposition: A | Discharge status: Home / Self Care | Payer: Commercial Managed Care - HMO | Source: Ambulatory Visit | Attending: Urology | Admitting: Urology

## 2022-07-22 VITALS — BP 127/74 | HR 99 | Ht 64.0 in | Wt 115.0 lb

## 2022-07-22 DIAGNOSIS — R972 Elevated prostate specific antigen [PSA]: Secondary | ICD-10-CM | POA: Diagnosis present

## 2022-07-22 DIAGNOSIS — C61 Malignant neoplasm of prostate: Secondary | ICD-10-CM | POA: Diagnosis not present

## 2022-07-22 DIAGNOSIS — N4232 Atypical small acinar proliferation of prostate: Secondary | ICD-10-CM

## 2022-07-22 DIAGNOSIS — N4231 Prostatic intraepithelial neoplasia: Secondary | ICD-10-CM | POA: Diagnosis not present

## 2022-07-22 MED ORDER — CEFTRIAXONE SODIUM 1 G IJ SOLR
1.0000 g | Freq: Once | INTRAMUSCULAR | Status: AC
  Administered 2022-07-22: 1 g via INTRAMUSCULAR

## 2022-07-22 NOTE — Progress Notes (Signed)
Assessment: 1. Elevated PSA     Plan: Post biopsy instructions given Return to office in 7-10 days for biopsy results  Chief Complaint:  Chief Complaint  Patient presents with   Prostate Biopsy    History of Present Illness:  [name] [name] is a 57 y.o. male who is seen for further evaluation of elevated PSA and abnormal CT of prostate. He was seen in the emergency department on 06/09/2022 for evaluation of chest and abdominal pain.  CT abdomen and pelvis with contrast was obtained and showed no renal lesions or obstruction, collapsed urinary bladder, heterogeneous lobular enhancement involving the right side of the prostate. No dysuria or gross hematuria.  He reported symptoms of occasional intermittent stream, sensation of incomplete emptying, frequency, and urgency.  No recent changes in his urinary symptoms.  No history of UTIs or prostatitis.  No history of kidney stones. IPSS = 11. No weight loss or bone pain.  He reports a good appetite.  No change in his bowel movements. PSA 1/24:  29.4.  He presents today for prostate biopsy.  Portions of the above documentation were copied from a prior visit for review purposes only.   Past Medical History:  Past Medical History:  Diagnosis Date   Abnormal ECG    a. early repolarization   Arthritis    "my whole left side" (05/03/2014)   Bipolar disorder (HCC)    Bradycardia    a. asymptomatic   CAD in native artery    a. Nonobstructive cath 11/2007;  b. Presented with ST elevation - Nonobstructive cath 08/2011   Chest pain, mid sternal    Coronary artery disease    GERD (gastroesophageal reflux disease)    History of cocaine abuse ([place])    a. quit ? 2009   History of ETOH abuse    a. drinks 2 "40's" / wk   Marijuana abuse    a. uses ~ 1x /wk or less   Pneumonia    Stomach ulcer    Syncope    a. 12/2010 - presumed to be vasovagal   Tobacco abuse     Past Surgical History:  Past Surgical History:  Procedure Laterality  Date   CARDIAC CATHETERIZATION  2009; 08/2011   [name] 08/06/2011   LEFT HEART CATHETERIZATION WITH CORONARY ANGIOGRAM N/A 08/16/2011   Procedure: LEFT HEART CATHETERIZATION WITH CORONARY ANGIOGRAM;  Surgeon: [name], MD;  Location[facility] CATH LAB;  Service: Cardiovascular;  Laterality: N/A;    Allergies:  Allergies  Allergen Reactions   Aspirin Other (See Comments)    Aggravates ulcers and "causes chest pain"   Pepperoni [Pickled Meat] Other (See Comments)    Aggravates ulcers   Tomato Other (See Comments)    Foods with tomato sauce aggravate ulcers    Family History:  No family history on file.  Social History:  Social History   Tobacco Use   Smoking status: Some Days    Packs/day: 0.25    Years: 2.00    Total pack years: 0.50    Types: Cigarettes    Last attempt to quit: 06/24/2015    Years since quitting: 7.0   Smokeless tobacco: Never  Vaping Use   Vaping Use: Never used  Substance Use Topics   Alcohol use: Yes    Alcohol/week: 48.0 standard drinks of alcohol    Types: 48 Cans of beer per week    Comment: 2 shot bottles yesterday   Drug use: Yes    Types: Marijuana, Cocaine  Comment: Crack one year ago and marijuana was yesterday    ROS: Constitutional:  Negative for fever, chills, weight loss CV: Negative for chest pain, previous MI, hypertension Respiratory:  Negative for shortness of breath, wheezing, sleep apnea, frequent cough GI:  Negative for nausea, vomiting, bloody stool, GERD  Physical exam: BP 127/74   Pulse 99   Ht 5' 4"$  (1.626 m)   Wt 115 lb (52.2 kg)   BMI 19.74 kg/m  GENERAL APPEARANCE:  Well appearing, well developed, well nourished, NAD HEENT:  Atraumatic, normocephalic, oropharynx clear NECK:  Supple without lymphadenopathy or thyromegaly ABDOMEN:  Soft, non-tender, no masses EXTREMITIES:  Moves all extremities well, without clubbing, cyanosis, or edema NEUROLOGIC:  Alert and oriented x 3, normal gait, CN II-XII grossly  intact MENTAL STATUS:  appropriate BACK:  Non-tender to palpation, No CVAT SKIN:  Warm, dry, and intact    Results: No specimen provided  TRANSRECTAL ULTRASOUND AND PROSTATE BIOPSY  Indication:  Elevated PSA  Prophylactic antibiotic administration: Rocephin  All medications that could result in increased bleeding were discontinued within an appropriate period of the time of biopsy.  Risk including bleeding and infection were discussed.  Informed consent was obtained.  The patient was placed in the left lateral decubitus position.  PROCEDURE 1.  TRANSRECTAL ULTRASOUND OF THE PROSTATE  The 7 MHz transrectal probe was used to image the prostate.  Anal stenosis was not noted.  TRUS volume: 24.6 ml  Hypoechoic areas: Right and mid-gland  Hyperechoic areas: None  Central calcifications: not present  Margins:  normal  Seminal vesicles:  increased echogenicity bilaterally   PROCEDURE 2:  PROSTATE BIOPSY  A periprostatic block was performed using 1% lidocaine and transrectal ultrasound guidance. Under transrectal ultrasound guidance, and using the Biopty gun, prostate biopsies were obtained systematically from the apex, mid gland, and base bilaterally.  A total of 12 cores were obtained.  Hemostasis was obtained with gentle pressure on the prostate.  The procedures were well-tolerated.  No significant bleeding was noted at the end of the procedure.  The patient was stable for discharge from the office.

## 2022-07-22 NOTE — Addendum Note (Signed)
Addended by: Evelina Bucy on: 07/22/2022 03:55 PM   Modules accepted: Orders

## 2022-07-22 NOTE — Progress Notes (Signed)
IM Injection  Patient is present today for an IM Injection for treatment of infection prevention post prostate biopsy. Drug: Ceftriaxone Dose:1g Location:Right upper outer buttocks Lot: [id number] Exp:01/06/2024 Patient tolerated well, no complications were noted  Performed by: Bradly Bienenstock CMA

## 2022-07-26 ENCOUNTER — Encounter: Payer: Self-pay | Admitting: Urology

## 2022-07-27 ENCOUNTER — Telehealth: Payer: Self-pay | Admitting: Urology

## 2022-07-27 ENCOUNTER — Other Ambulatory Visit: Payer: Self-pay | Admitting: Urology

## 2022-07-27 DIAGNOSIS — C61 Malignant neoplasm of prostate: Secondary | ICD-10-CM

## 2022-07-27 NOTE — Telephone Encounter (Signed)
Biopsy results discussed with the patient by phone today. Given the findings of Gleason grade group 1, 4, and 5 and his PSA of 29.4, his prostate cancer is high risk. Recommend further evaluation with a PSMA PET scan for staging of high risk prostate cancer. Order placed.

## 2022-08-02 ENCOUNTER — Ambulatory Visit: Payer: Medicaid Other | Admitting: Urology

## 2022-08-10 ENCOUNTER — Encounter (HOSPITAL_COMMUNITY)
Admission: RE | Admit: 2022-08-10 | Discharge: 2022-08-10 | Disposition: A | Discharge status: Home / Self Care | Payer: Commercial Managed Care - HMO | Source: Ambulatory Visit | Attending: Urology | Admitting: Urology

## 2022-08-10 DIAGNOSIS — C61 Malignant neoplasm of prostate: Secondary | ICD-10-CM | POA: Insufficient documentation

## 2022-08-10 MED ORDER — PIFLIFOLASTAT F 18 (PYLARIFY) INJECTION
9.0000 | Freq: Once | INTRAVENOUS | Status: AC
  Administered 2022-08-10: 8.46 via INTRAVENOUS

## 2022-08-11 NOTE — Progress Notes (Deleted)
Assessment: 1. Prostate cancer [facility])      Plan: Post biopsy instructions given Return to office in 7-10 days for biopsy results  Chief Complaint:  No chief complaint on file.   History of Present Illness:  [name] [name] is a 57 y.o. male who is seen for further evaluation of recently diagnosed high risk prostate cancer. He was seen in the emergency department on 06/09/2022 for evaluation of chest and abdominal pain.  CT abdomen and pelvis with contrast was obtained and showed no renal lesions or obstruction, collapsed urinary bladder, heterogeneous lobular enhancement involving the right side of the prostate. No dysuria or gross hematuria.  He reported symptoms of occasional intermittent stream, sensation of incomplete emptying, frequency, and urgency.  No recent changes in his urinary symptoms.  No history of UTIs or prostatitis.  No history of kidney stones. IPSS = 11. No weight loss or bone pain.  He reports a good appetite.  No change in his bowel movements. PSA 1/24:  29.4.  He returns today following his transrectal ultrasound and biopsy of the prostate on 07/22/22. PSA: 29.4 ng/ml TRUS volume:  24.6 ml  PSA density:  *** Biopsy results:             Gleason score: {NUMBERS 1-5:23998}+ {NUMBERS 1-5:23998} = {Numbers; [id number]            # positive cores: *** on right    *** on left            Location of cancer: [id number]  Complications after biopsy: [id number] Patient {With/Without:20273} significant LUTS.    IPSS = *** Patient {With/Without:20273} erectile dysfunction.      Portions of the above documentation were copied from a prior visit for review purposes only.   Past Medical History:  Past Medical History:  Diagnosis Date   Abnormal ECG    a. early repolarization   Arthritis    "my whole left side" (05/03/2014)   Bipolar disorder (HCC)    Bradycardia    a. asymptomatic   CAD in native artery    a. Nonobstructive cath 11/2007;  b.  Presented with ST elevation - Nonobstructive cath 08/2011   Chest pain, mid sternal    Coronary artery disease    GERD (gastroesophageal reflux disease)    History of cocaine abuse ([place])    a. quit ? 2009   History of ETOH abuse    a. drinks 2 "40's" / wk   Marijuana abuse    a. uses ~ 1x /wk or less   Pneumonia    Stomach ulcer    Syncope    a. 12/2010 - presumed to be vasovagal   Tobacco abuse     Past Surgical History:  Past Surgical History:  Procedure Laterality Date   CARDIAC CATHETERIZATION  2009; 08/2011   [name] 08/06/2011   LEFT HEART CATHETERIZATION WITH CORONARY ANGIOGRAM N/A 08/16/2011   Procedure: LEFT HEART CATHETERIZATION WITH CORONARY ANGIOGRAM;  Surgeon: [name], MD;  Location: [facility] CATH LAB;  Service: Cardiovascular;  Laterality: N/A;    Allergies:  Allergies  Allergen Reactions   Aspirin Other (See Comments)    Aggravates ulcers and "causes chest pain"   Pepperoni [Pickled Meat] Other (See Comments)    Aggravates ulcers   Tomato Other (See Comments)    Foods with tomato sauce aggravate ulcers    Family History:  No family history on file.  Social History:  Social History   Tobacco Use   Smoking status:  Some Days    Packs/day: 0.25    Years: 2.00    Total pack years: 0.50    Types: Cigarettes    Last attempt to quit: 06/24/2015    Years since quitting: 7.1   Smokeless tobacco: Never  Vaping Use   Vaping Use: Never used  Substance Use Topics   Alcohol use: Yes    Alcohol/week: 48.0 standard drinks of alcohol    Types: 48 Cans of beer per week    Comment: 2 shot bottles yesterday   Drug use: Yes    Types: Marijuana, Cocaine    Comment: Crack one year ago and marijuana was yesterday    ROS: Constitutional:  Negative for fever, chills, weight loss CV: Negative for chest pain, previous MI, hypertension Respiratory:  Negative for shortness of breath, wheezing, sleep apnea, frequent cough GI:  Negative for nausea, vomiting, bloody  stool, GERD  Physical exam: There were no vitals taken for this visit. GENERAL APPEARANCE:  Well appearing, well developed, well nourished, NAD HEENT:  Atraumatic, normocephalic, oropharynx clear NECK:  Supple without lymphadenopathy or thyromegaly ABDOMEN:  Soft, non-tender, no masses EXTREMITIES:  Moves all extremities well, without clubbing, cyanosis, or edema NEUROLOGIC:  Alert and oriented x 3, normal gait, CN II-XII grossly intact MENTAL STATUS:  appropriate BACK:  Non-tender to palpation, No CVAT SKIN:  Warm, dry, and intact    Results: U/A dipstick:

## 2022-08-12 ENCOUNTER — Encounter: Payer: Self-pay | Admitting: Urology

## 2022-08-12 ENCOUNTER — Ambulatory Visit: Payer: Medicaid Other | Admitting: Urology

## 2022-08-12 DIAGNOSIS — C61 Malignant neoplasm of prostate: Secondary | ICD-10-CM

## 2022-08-24 ENCOUNTER — Telehealth: Payer: Self-pay | Admitting: Urology

## 2022-08-24 ENCOUNTER — Ambulatory Visit: Payer: Medicaid Other | Admitting: Urology

## 2022-08-24 DIAGNOSIS — C61 Malignant neoplasm of prostate: Secondary | ICD-10-CM

## 2022-08-24 NOTE — Progress Notes (Deleted)
Assessment: 1. Prostate cancer ([name] [name]); PSA 29.4; GG 1,4,5; high risk disease     Plan: I spent a total of *** minutes counseling [name] [name] regarding the diagnosis of localized prostate cancer.  I spent the first *** minutes discussing the biopsy results.  Using the prostate cancer nomogram, I cited a probability of 6 percent for localized prostate cancer, 91 percent of extracapsular extension, 47 percent of seminal vesicle involvement, and 47 percent of lymph node involvement.  I discussed the diagnosis of localized prostate cancer in the natural history of prostate cancer. I then spent the next *** minutes discussing treatment options for localized prostate cancer.  Specifically, I discussed {BJSPCATX:26348}.  I discussed the risk and benefits of each treatment.  I discussed the potential risk of impotence and incontinence as they relate to treatment of prostate cancer.  Questions were answered.  The patient was given literature regarding prostate cancer to review.   Referral to Radiation Oncology  Chief Complaint:  No chief complaint on file.   History of Present Illness:  [name] [name] is a 57 y.o. male who is seen for further evaluation of recently diagnosed high risk prostate cancer. He was seen in the emergency department on 06/09/2022 for evaluation of chest and abdominal pain.  CT abdomen and pelvis with contrast was obtained and showed no renal lesions or obstruction, collapsed urinary bladder, heterogeneous lobular enhancement involving the right side of the prostate. No dysuria or gross hematuria.  He reported symptoms of occasional intermittent stream, sensation of incomplete emptying, frequency, and urgency.  No recent changes in his urinary symptoms.  No history of UTIs or prostatitis.  No history of kidney stones. IPSS = 11. No weight loss or bone pain.  He reports a good appetite.  No change in his bowel movements. PSA 1/24:  29.4.  He returns today following his  transrectal ultrasound and biopsy of the prostate on 07/22/22. PSA: 29.4 ng/ml TRUS volume:  24.6 ml  PSA density:  1.2 Biopsy results:             Gleason score: 4+ 5 = 9, 4 + 4 = 8, 3 + 3 = 6            # positive cores: 4/6 on right    1/6 on left            Location of cancer: mid gland and base  Complications after biopsy: none Patient {With/Without:20273} significant LUTS.    IPSS = *** Patient {With/Without:20273} erectile dysfunction.     PSMA PET scan from 08/10/2022 showed intense radiotracer accumulation in the right side of the prostate and no evidence of metastatic disease.  Portions of the above documentation were copied from a prior visit for review purposes only.   Past Medical History:  Past Medical History:  Diagnosis Date   Abnormal ECG    a. early repolarization   Arthritis    "my whole left side" (05/03/2014)   Bipolar disorder (HCC)    Bradycardia    a. asymptomatic   CAD in native artery    a. Nonobstructive cath 11/2007;  b. Presented with ST elevation - Nonobstructive cath 08/2011   Chest pain, mid sternal    Coronary artery disease    GERD (gastroesophageal reflux disease)    History of cocaine abuse ([place])    a. quit ? 2009   History of ETOH abuse    a. drinks 2 "40's" / wk   Marijuana abuse  a. uses ~ 1x /wk or less   Pneumonia    Stomach ulcer    Syncope    a. 12/2010 - presumed to be vasovagal   Tobacco abuse     Past Surgical History:  Past Surgical History:  Procedure Laterality Date   CARDIAC CATHETERIZATION  2009; 08/2011   [name] 08/06/2011   LEFT HEART CATHETERIZATION WITH CORONARY ANGIOGRAM N/A 08/16/2011   Procedure: LEFT HEART CATHETERIZATION WITH CORONARY ANGIOGRAM;  Surgeon: [name], MD;  Location: [facility] CATH LAB;  Service: Cardiovascular;  Laterality: N/A;    Allergies:  Allergies  Allergen Reactions   Aspirin Other (See Comments)    Aggravates ulcers and "causes chest pain"   Pepperoni [Pickled Meat] Other (See  Comments)    Aggravates ulcers   Tomato Other (See Comments)    Foods with tomato sauce aggravate ulcers    Family History:  No family history on file.  Social History:  Social History   Tobacco Use   Smoking status: Some Days    Packs/day: 0.25    Years: 2.00    Additional pack years: 0.00    Total pack years: 0.50    Types: Cigarettes    Last attempt to quit: 06/24/2015    Years since quitting: 7.1   Smokeless tobacco: Never  Vaping Use   Vaping Use: Never used  Substance Use Topics   Alcohol use: Yes    Alcohol/week: 48.0 standard drinks of alcohol    Types: 48 Cans of beer per week    Comment: 2 shot bottles yesterday   Drug use: Yes    Types: Marijuana, Cocaine    Comment: Crack one year ago and marijuana was yesterday    ROS: Constitutional:  Negative for fever, chills, weight loss CV: Negative for chest pain, previous MI, hypertension Respiratory:  Negative for shortness of breath, wheezing, sleep apnea, frequent cough GI:  Negative for nausea, vomiting, bloody stool, GERD  Physical exam: There were no vitals taken for this visit. GENERAL APPEARANCE:  Well appearing, well developed, well nourished, NAD HEENT:  Atraumatic, normocephalic, oropharynx clear NECK:  Supple without lymphadenopathy or thyromegaly ABDOMEN:  Soft, non-tender, no masses EXTREMITIES:  Moves all extremities well, without clubbing, cyanosis, or edema NEUROLOGIC:  Alert and oriented x 3, normal gait, CN II-XII grossly intact MENTAL STATUS:  appropriate BACK:  Non-tender to palpation, No CVAT SKIN:  Warm, dry, and intact    Results: U/A dipstick:

## 2022-08-24 NOTE — Telephone Encounter (Signed)
The patient was scheduled for a office visit today but did not keep his appointment.  I contacted the patient's case manager as well as the patient and discussed the current situation by phone.  I reviewed the patient's prostate biopsy results from 07/22/2022 showing Gleason grade group 1, 4, and 5.  Given his PSA of 29.4 and the biopsy results, his prostate cancer is high risk.  He was evaluated further with a PSMA PET scan on 08/10/2022 which showed no evidence of metastatic disease and uptake in the prostate.  I discussed these results with [name] [name] and Luetta Nutting today.  Given the high risk prostate cancer and significant risk for extracapsular extension, I recommended that he consider IMRT for management of his prostate cancer.  I discussed this treatment modality with him.  I also discussed the use of androgen deprivation therapy along with radiation therapy in the management of high risk prostate cancer.  I recommend a consultation with Dr. Tammi Klippel in radiation oncology.  Referral will be placed today. Questions were answered.  Mr. [name] expressed understanding of these recommendations and would like to proceed with treatment.

## 2022-08-25 ENCOUNTER — Telehealth: Payer: Self-pay | Admitting: Radiation Oncology

## 2022-08-25 NOTE — Telephone Encounter (Signed)
3/20 @ 9:17 am Left voicemail for Amber (Case Manger for patient) to call our office for patient to be schedule for consult with Dr. Tammi Klippel.
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
## 2022-09-03 NOTE — Progress Notes (Signed)
Radiation Oncology         [phone] ________________________________  Initial Outpatient Consultation  Name: [name] [name] MRN: [id number]  Date: 09/06/2022  DOB: May 05, 1966  WU:[id number], Floyde Parkins, FNP  Pete Glatter, Danford Bad., *   REFERRING PHYSICIAN: Milderd Meager., *  DIAGNOSIS: 57 y.o. gentleman with Stage T1c adenocarcinoma of the prostate with Gleason score of 4+5, and PSA of 29.4.    ICD-10-CM   1. Malignant neoplasm of prostate  C61       HISTORY OF PRESENT ILLNESS: [name] [name] is a 57 y.o. male with a diagnosis of prostate cancer. He originally presented to the ED on 06/09/22 for chest and abdominal pain. CT of the abdomen and pelvis incidentally showed a heterogenous lobular area of enhancement involving the right side of the prostate. Accordingly, he was referred for evaluation in urology by Dr. Pete Glatter on 06/16/22,  digital rectal examination was performed at that time revealing no concerning nodules. PSA obtained on 06/16/22 was elevated at 29.4. The patient proceeded to transrectal ultrasound with 12 biopsies of the prostate on 07/22/22.  The prostate volume measured 40 cc.  Out of 12 core biopsies, 5 were positive.  The maximum Gleason score was 4+5, and this was seen in right mid. Additionally gleason score of 4+4 was seen in the right base, right lateral base, and right lateral mid. A gleason score of 3+3 was seen in the left lateral base.   PSMA PET scan on 08/11/22 showed intense radiotracer accumulation in the right side of the prostate, consistent with primary prostate carcinoma. Fortunately, no evidence of metastatic disease was seen.   The patient reviewed the biopsy results with his urologist and he has kindly been referred today for discussion of potential radiation treatment options.   PREVIOUS RADIATION THERAPY: No  PAST MEDICAL HISTORY:  Past Medical History:  Diagnosis Date   Abnormal ECG    a. early repolarization   Arthritis    "my whole left  side" (05/03/2014)   Bipolar disorder (HCC)    Bradycardia    a. asymptomatic   CAD in native artery    a. Nonobstructive cath 11/2007;  b. Presented with ST elevation - Nonobstructive cath 08/2011   Chest pain, mid sternal    Coronary artery disease    GERD (gastroesophageal reflux disease)    History of cocaine abuse (HCC)    a. quit ? 2009   History of ETOH abuse    a. drinks 2 "40's" / wk   Marijuana abuse    a. uses ~ 1x /wk or less   Pneumonia    Stomach ulcer    Syncope    a. 12/2010 - presumed to be vasovagal   Tobacco abuse       PAST SURGICAL HISTORY: Past Surgical History:  Procedure Laterality Date   CARDIAC CATHETERIZATION  2009; 08/2011   Hattie Perch 08/06/2011   LEFT HEART CATHETERIZATION WITH CORONARY ANGIOGRAM N/A 08/16/2011   Procedure: LEFT HEART CATHETERIZATION WITH CORONARY ANGIOGRAM;  Surgeon: Corky Crafts, MD;  Location: [facility] CATH LAB;  Service: Cardiovascular;  Laterality: N/A;    FAMILY HISTORY: No family history on file.  SOCIAL HISTORY:  Social History   Socioeconomic History   Marital status: Widowed    Spouse name: Not on file   Number of children: 7   Years of education: Not on file   Highest education level: 10th grade  Occupational History   Occupation: unemployed  Tobacco Use   Smoking status:  Some Days    Packs/day: 0.25    Years: 2.00    Additional pack years: 0.00    Total pack years: 0.50    Types: Cigarettes    Last attempt to quit: 06/24/2015    Years since quitting: 7.2   Smokeless tobacco: Never  Vaping Use   Vaping Use: Never used  Substance and Sexual Activity   Alcohol use: Yes    Alcohol/week: 48.0 standard drinks of alcohol    Types: 48 Cans of beer per week    Comment: 2 shot bottles yesterday   Drug use: Yes    Types: Marijuana, Cocaine    Comment: Crack one year ago and marijuana was yesterday   Sexual activity: Not Currently  Other Topics Concern   Not on file  Social History Narrative   Lives with his  daughter currently and receives SSI.    Social Determinants of Health   Financial Resource Strain: Not on file  Food Insecurity: No Food Insecurity (09/06/2022)   Hunger Vital Sign    Worried About Running Out of Food in the Last Year: Never true    Ran Out of Food in the Last Year: Never true  Transportation Needs: No Transportation Needs (09/06/2022)   PRAPARE - Administrator, Civil Service (Medical): No    Lack of Transportation (Non-Medical): No  Physical Activity: Not on file  Stress: Not on file  Social Connections: Not on file  Intimate Partner Violence: Not At Risk (09/06/2022)   Humiliation, Afraid, Rape, and Kick questionnaire    Fear of Current or Ex-Partner: No    Emotionally Abused: No    Physically Abused: No    Sexually Abused: No    ALLERGIES: Aspirin, Other, Pepperoni [pickled meat], and Tomato  MEDICATIONS:  Current Outpatient Medications  Medication Sig Dispense Refill   famotidine (PEPCID) 20 MG tablet Take 1 tablet (20 mg total) by mouth 2 (two) times daily. 60 tablet 0   pantoprazole (PROTONIX) 20 MG tablet Take 1 tablet (20 mg total) by mouth daily. 30 tablet 1   sucralfate (CARAFATE) 1 g tablet Take 1 tablet (1 g total) by mouth 4 (four) times daily -  with meals and at bedtime. 60 tablet 1   No current facility-administered medications for this encounter.    REVIEW OF SYSTEMS:  On review of systems, the patient reports that he is doing well overall. He denies any chest pain, shortness of breath, cough, fevers, chills, night sweats, unintended weight changes. He denies any bowel disturbances, and denies abdominal pain, nausea or vomiting. He denies any new musculoskeletal or joint aches or pains. His IPSS was 12, indicating moderate urinary symptoms.  His SHIM was 12, indicating he has moderate erectile dysfunction. A complete review of systems is obtained and is otherwise negative.   PHYSICAL EXAM:  Wt Readings from Last 3 Encounters:  09/06/22  120 lb (54.4 kg)  07/22/22 115 lb (52.2 kg)  06/16/22 115 lb (52.2 kg)   Temp Readings from Last 3 Encounters:  09/06/22 (!) 97.2 F (36.2 C) (Temporal)  06/09/22 98 F (36.7 C)  05/29/22 97.6 F (36.4 C) (Oral)   BP Readings from Last 3 Encounters:  09/06/22 116/85  07/22/22 127/74  06/16/22 127/83   Pulse Readings from Last 3 Encounters:  09/06/22 (!) 57  07/22/22 99  06/16/22 (!) 48   Pain Assessment Pain Score: 10-Worst pain ever Pain Loc: Neck/10  In general this is a well appearing gentleman in no  acute distress. He's alert and oriented x4 and appropriate throughout the examination. Cardiopulmonary assessment is negative for acute distress, and he exhibits normal effort.    KPS = 80  100 - Normal; no complaints; no evidence of disease. 90   - Able to carry on normal activity; minor signs or symptoms of disease. 80   - Normal activity with effort; some signs or symptoms of disease. 83   - Cares for self; unable to carry on normal activity or to do active work. 60   - Requires occasional assistance, but is able to care for most of his personal needs. 50   - Requires considerable assistance and frequent medical care. 40   - Disabled; requires special care and assistance. 30   - Severely disabled; hospital admission is indicated although death not imminent. 20   - Very sick; hospital admission necessary; active supportive treatment necessary. 10   - Moribund; fatal processes progressing rapidly. 0     - Dead  Karnofsky DA, Abelmann WH, Craver LS and Burchenal [facility] [phone]) The use of the nitrogen mustards in the palliative treatment of carcinoma: with particular reference to bronchogenic carcinoma Cancer 1 634-56  LABORATORY DATA:  Lab Results  Component Value Date   WBC 5.6 06/09/2022   HGB 12.8 (L) 06/09/2022   HCT 38.9 (L) 06/09/2022   MCV 92.8 06/09/2022   PLT 228 06/09/2022   Lab Results  Component Value Date   NA 133 (L) 06/09/2022   K 3.6 06/09/2022   CL  103 06/09/2022   CO2 24 06/09/2022   Lab Results  Component Value Date   ALT 11 06/09/2022   AST 14 (L) 06/09/2022   ALKPHOS 49 06/09/2022   BILITOT 0.5 06/09/2022     RADIOGRAPHY: No results found.    IMPRESSION/PLAN: 1. 57 y.o. gentleman with Stage T1c adenocarcinoma of the prostate with Gleason score of 4+5, and PSA of 29.4. We discussed the patient's workup and outlined the nature of prostate cancer in this setting. The patient's T stage, Gleason's score, and PSA put him into the high risk group. Accordingly, he is eligible for a variety of potential treatment options including 8 weeks of external radiation, 5 weeks of external radiation with an upfront brachytherapy boost, or prostatectomy. We discussed the available radiation techniques, and focused on the details and logistics of delivery. We discussed and outlined the risks, benefits, short and long-term effects associated with radiotherapy and compared and contrasted these with prostatectomy. We discussed the role of SpaceOAR gel in reducing the rectal toxicity associated with radiotherapy. We also detailed the role of ADT in the treatment of high risk prostate cancer and outlined the associated side effects that could be expected with this therapy. He appears to have a good understanding of his disease and our treatment recommendations which are of curative intent.  He was encouraged to ask questions that were answered to his stated satisfaction.  At the conclusion of our conversation, the patient is interested in moving forward with ADT in combination with brachytherapy followed by 5 weeks of radiation therapy. We look forward to participating in this patient's care. A member from our scheduling team met with the patient today to get him scheduled for seed placement. We will refer him back to Dr. Laverle Hobby office to begin ADT therapy.   We personally spent 60 minutes in this encounter including chart review, reviewing radiological  studies, meeting face-to-face with the patient, entering orders and completing documentation.     Ellie  Corky Sing, PA-C    Margaretmary Dys, MD  [facility]  Radiation Oncology Direct Dial: [phone]  Fax: [phone] [email or web address]  Skype  [email or web address]

## 2022-09-06 ENCOUNTER — Ambulatory Visit
Admission: RE | Admit: 2022-09-06 | Discharge: 2022-09-06 | Disposition: A | Discharge status: Home / Self Care | Payer: Commercial Managed Care - HMO | Source: Ambulatory Visit | Attending: Radiation Oncology | Admitting: Radiation Oncology

## 2022-09-06 ENCOUNTER — Other Ambulatory Visit: Payer: Self-pay

## 2022-09-06 VITALS — BP 116/85 | HR 57 | Temp 97.2°F | Resp 18 | Ht 64.0 in | Wt 120.0 lb

## 2022-09-06 DIAGNOSIS — C61 Malignant neoplasm of prostate: Secondary | ICD-10-CM | POA: Insufficient documentation

## 2022-09-10 ENCOUNTER — Telehealth: Payer: Self-pay | Admitting: *Deleted

## 2022-09-10 NOTE — Progress Notes (Signed)
Spoke with patient's case manager via telephone to introduce myself as the prostate nurse navigator and discussed my role.  Case manager is notified that patient is scheduled to start his ADT with Dr. Pete Glatter on 4/11 @ 10:15am.  I provided Joice Lofts, case manager, my contact information.   RN also called patient to introduce my role, however, no answer, no option to leave a voicemail.  Will attempt at a later time.   Plan of care in progress.

## 2022-09-10 NOTE — Telephone Encounter (Signed)
Called patient to inform of ADT appt. on 09-15-25 - arrival time- 10:15 am @ Dr. Laverle Hobby Office in [facility], address- [address], phone number [phone], spoke with patient and he is aware of this appt.

## 2022-09-13 ENCOUNTER — Telehealth: Payer: Self-pay

## 2022-09-13 NOTE — Telephone Encounter (Signed)
Dr. Broadus Ahlijah office wanted pt to start ADT. He is scheduled for Thursday. What do you want him to have so we can get it approved?

## 2022-09-14 NOTE — Telephone Encounter (Signed)
Pt has Medicaid, No PA required.

## 2022-09-15 ENCOUNTER — Telehealth: Payer: Self-pay

## 2022-09-15 NOTE — Telephone Encounter (Signed)
CSW attempted to contact case manager.  Left a vm.

## 2022-09-16 ENCOUNTER — Ambulatory Visit: Payer: Medicaid Other | Admitting: Urology

## 2022-09-16 ENCOUNTER — Inpatient Hospital Stay: Payer: Medicaid Other | Attending: Radiation Oncology

## 2022-09-16 NOTE — Progress Notes (Signed)
[facility] Clinical Social Work  Clinical Social Work was referred by  Firefighter at [employer], Hospital doctor,  for assessment of psychosocial needs.  Clinical Social Worker contacted patient by phone to offer support and assess for needs.  His friend, [name] [name], is also helping him with his appointments.  Patient stated he plans on having treatment at [facility], but has to work with Dr. Pete [name] first.  He was having some difficulty with transportation, but has contacted the transportation company associated with his Medicaid.  He will see Dr. Pete [name] next week.  CSW informed patient of role and benefits available once he begins his treatment at [facility].  He stated he understood.  Provided him with CSW contact information. Left a vm for his case worker also.     Dorothey Baseman, LCSW  Clinical Social Worker [facility]        Patient is participating in a Managed Medicaid Plan:  Yes

## 2022-09-17 ENCOUNTER — Inpatient Hospital Stay: Payer: Medicaid Other

## 2022-09-17 NOTE — Progress Notes (Signed)
[facility] CSW Progress Note  Visual merchandiser received a call from Firefighter at [employer], [facility].  She is trying to arrange transportation through his Medicaid benefit for his appointment next week with Dr. Pete Glatter.  Informed her of CSW role.  She will be discharging him from her services next week.    Pascal Lux Bryssa Tones, LCSW    Patient is participating in a Managed Medicaid Plan:  Yes

## 2022-09-22 DIAGNOSIS — C61 Malignant neoplasm of prostate: Secondary | ICD-10-CM | POA: Insufficient documentation

## 2022-09-23 ENCOUNTER — Encounter: Payer: Self-pay | Admitting: Urology

## 2022-09-23 ENCOUNTER — Ambulatory Visit (INDEPENDENT_AMBULATORY_CARE_PROVIDER_SITE_OTHER): Payer: Medicaid Other | Admitting: Urology

## 2022-09-23 VITALS — BP 106/65 | HR 62 | Ht 65.0 in | Wt 119.0 lb

## 2022-09-23 DIAGNOSIS — C61 Malignant neoplasm of prostate: Secondary | ICD-10-CM | POA: Diagnosis not present

## 2022-09-23 DIAGNOSIS — Z79818 Long term (current) use of other agents affecting estrogen receptors and estrogen levels: Secondary | ICD-10-CM | POA: Diagnosis not present

## 2022-09-23 LAB — URINALYSIS, ROUTINE W REFLEX MICROSCOPIC
Bilirubin, UA: NEGATIVE
Glucose, UA: NEGATIVE
Ketones, UA: NEGATIVE
Leukocytes,UA: NEGATIVE
Nitrite, UA: NEGATIVE
Protein,UA: NEGATIVE
RBC, UA: NEGATIVE
Specific Gravity, UA: 1.02 (ref 1.005–1.030)
Urobilinogen, Ur: 0.2 mg/dL (ref 0.2–1.0)
pH, UA: 7 (ref 5.0–7.5)

## 2022-09-23 MED ORDER — LEUPROLIDE ACETATE (6 MONTH) 45 MG ~~LOC~~ KIT
45.0000 mg | PACK | Freq: Once | SUBCUTANEOUS | Status: AC
  Administered 2022-09-23: 45 mg via SUBCUTANEOUS

## 2022-09-23 NOTE — Progress Notes (Signed)
Assessment: 1. Prostate cancer; PSA 29.4; GG 1,4,5; PSMA PET negative; high risk disease   2. Androgen deprivation therapy     Plan: I discussed the role of androgen deprivation therapy in the management of high risk prostate cancer and the benefits of ADT in association with radiation therapy.  Potential side effects discussed. Eligard 22-month injection given today. Return to office in 3 months.  Chief Complaint:  Chief Complaint  Patient presents with   Prostate Cancer    History of Present Illness:  [name] [name] is a 57 y.o. male who is seen for further evaluation of recently diagnosed high risk prostate cancer and initiation of ADT. He was seen in the emergency department on 06/09/2022 for evaluation of chest and abdominal pain.  CT abdomen and pelvis with contrast was obtained and showed no renal lesions or obstruction, collapsed urinary bladder, heterogeneous lobular enhancement involving the right side of the prostate. No dysuria or gross hematuria.  He reported symptoms of occasional intermittent stream, sensation of incomplete emptying, frequency, and urgency.  No recent changes in his urinary symptoms.  No history of UTIs or prostatitis.  No history of kidney stones. IPSS = 11. No weight loss or bone pain.  He reports a good appetite.  No change in his bowel movements. PSA 1/24:  29.4.  He underwent a transrectal ultrasound and biopsy of the prostate on 07/22/22. PSA: 29.4 ng/ml TRUS volume:  24.6 ml  PSA density:  1.2 Biopsy results:             Gleason score: 4+ 5 = 9, 4 + 4 = 8, 3 + 3 = 6            # positive cores: 4/6 on right    1/6 on left            Location of cancer: mid gland and base  Complications after biopsy: none  PSMA PET scan from 08/10/2022 showed intense radiotracer accumulation in the right side of the prostate and no evidence of metastatic disease.  He has seen Dr. Kathrynn Running with Radiation Oncology and elected to proceed with brachytherapy followed  by IMRT.  He presents today for initiation of ADT.  Portions of the above documentation were copied from a prior visit for review purposes only.   Past Medical History:  Past Medical History:  Diagnosis Date   Abnormal ECG    a. early repolarization   Arthritis    "my whole left side" (05/03/2014)   Bipolar disorder (HCC)    Bradycardia    a. asymptomatic   CAD in native artery    a. Nonobstructive cath 11/2007;  b. Presented with ST elevation - Nonobstructive cath 08/2011   Chest pain, mid sternal    Coronary artery disease    GERD (gastroesophageal reflux disease)    History of cocaine abuse (HCC)    a. quit ? 2009   History of ETOH abuse    a. drinks 2 "40's" / wk   Marijuana abuse    a. uses ~ 1x /wk or less   Pneumonia    Stomach ulcer    Syncope    a. 12/2010 - presumed to be vasovagal   Tobacco abuse     Past Surgical History:  Past Surgical History:  Procedure Laterality Date   CARDIAC CATHETERIZATION  2009; 08/2011   Hattie Perch 08/06/2011   LEFT HEART CATHETERIZATION WITH CORONARY ANGIOGRAM N/A 08/16/2011   Procedure: LEFT HEART CATHETERIZATION WITH CORONARY ANGIOGRAM;  Surgeon:  Corky Crafts, MD;  Location: [facility] CATH LAB;  Service: Cardiovascular;  Laterality: N/A;    Allergies:  Allergies  Allergen Reactions   Aspirin Other (See Comments)    Aggravates ulcers and "causes chest pain"   Other    Pepperoni [Pickled Meat] Other (See Comments)    Aggravates ulcers   Tomato Other (See Comments)    Foods with tomato sauce aggravate ulcers    Family History:  No family history on file.  Social History:  Social History   Tobacco Use   Smoking status: Some Days    Packs/day: 0.25    Years: 2.00    Additional pack years: 0.00    Total pack years: 0.50    Types: Cigarettes    Last attempt to quit: 06/24/2015    Years since quitting: 7.2   Smokeless tobacco: Never  Vaping Use   Vaping Use: Never used  Substance Use Topics   Alcohol use: Yes     Alcohol/week: 48.0 standard drinks of alcohol    Types: 48 Cans of beer per week    Comment: 2 shot bottles yesterday   Drug use: Yes    Types: Marijuana, Cocaine    Comment: Crack one year ago and marijuana was yesterday    ROS: Constitutional:  Negative for fever, chills, weight loss CV: Negative for chest pain, previous MI, hypertension Respiratory:  Negative for shortness of breath, wheezing, sleep apnea, frequent cough GI:  Negative for nausea, vomiting, bloody stool, GERD  Physical exam: BP 106/65   Pulse 62   Ht  (1.651 m)   Wt 119 lb (54 kg)   BMI 19.80 kg/m  GENERAL APPEARANCE:  Well appearing, well developed, well nourished, NAD HEENT:  Atraumatic, normocephalic, oropharynx clear NECK:  Supple without lymphadenopathy or thyromegaly ABDOMEN:  Soft, non-tender, no masses EXTREMITIES:  Moves all extremities well, without clubbing, cyanosis, or edema NEUROLOGIC:  Alert and oriented x 3, normal gait, CN II-XII grossly intact MENTAL STATUS:  appropriate BACK:  Non-tender to palpation, No CVAT SKIN:  Warm, dry, and intact    Results: U/A: negative

## 2022-09-23 NOTE — Progress Notes (Signed)
Eligard SubQ Injection   Due to Prostate Cancer patient is present today for a Eligard Injection.  Medication: Eligard 6 month Dose: 45 mg  Location: right upper outer buttocks Lot: [id number] Exp: 01/2024  Patient tolerated well, no complications were noted  Performed by: Franchot Erichsen CMA

## 2022-09-24 NOTE — Progress Notes (Signed)
Patient was a Administrator on 4/1 for his stage T1c adenocarcinoma of the prostate with Gleason score of 4+5, and PSA of 29.4 and agreeable to proceed with ADT followed by brachytherapy, and daily radiation.  Patient receive Eligard on 4/18, plan of care in progress.

## 2022-09-27 ENCOUNTER — Ambulatory Visit (HOSPITAL_COMMUNITY)
Admission: EM | Admit: 2022-09-27 | Discharge: 2022-09-27 | Disposition: A | Discharge status: Home / Self Care | Payer: Medicaid Other

## 2022-09-27 ENCOUNTER — Encounter (HOSPITAL_COMMUNITY): Payer: Self-pay

## 2022-09-27 DIAGNOSIS — G8929 Other chronic pain: Secondary | ICD-10-CM | POA: Diagnosis not present

## 2022-09-27 DIAGNOSIS — S161XXA Strain of muscle, fascia and tendon at neck level, initial encounter: Secondary | ICD-10-CM

## 2022-09-27 DIAGNOSIS — M542 Cervicalgia: Secondary | ICD-10-CM | POA: Diagnosis not present

## 2022-09-27 MED ORDER — CYCLOBENZAPRINE HCL 10 MG PO TABS: 10.0000 mg | ORAL_TABLET | Freq: Two times a day (BID) | ORAL | 0 refills | Status: AC | PRN
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
## 2022-09-27 NOTE — Discharge Instructions (Signed)
Your pain is likely due to a muscle strain which will improve on its own with time.   - Tylenol 1,000mg every 6 hours as needed for aches and pains. - You may also take the prescribed muscle relaxer as directed as needed for muscle aches/spasm.  Do not take this medication and drive or drink alcohol as it can make you sleepy.  Mainly use this medicine at nighttime as needed. - Apply heat 20 minutes on then 20 minutes off and perform gentle range of motion exercises to the area of greatest pain to prevent muscle stiffness and provide further pain relief.   Red flag symptoms to watch out for are numbness/tingling to the legs, weakness, loss of bowel/bladder control, and/or worsening pain that does not respond well to medicines. Follow-up with your primary care provider or return to urgent care if your symptoms do not improve in the next 3 to 4 days with medications and interventions recommended today. If your symptoms are severe (red flag), please go to the emergency room.  I hope you feel better!   

## 2022-10-14 ENCOUNTER — Telehealth: Payer: Self-pay | Admitting: Urology

## 2022-10-14 NOTE — Telephone Encounter (Signed)
Someone from [facility] called and stated Talbert Forest from Dr Orvis Brill office called in regards to patient having [employer] and Orajel, stated they needed a referral and notes sent over to [facility] about this, they don't have any records.

## 2022-10-19 ENCOUNTER — Telehealth: Payer: Self-pay | Admitting: *Deleted

## 2022-10-19 ENCOUNTER — Ambulatory Visit (INDEPENDENT_AMBULATORY_CARE_PROVIDER_SITE_OTHER): Payer: Commercial Managed Care - HMO | Admitting: Primary Care

## 2022-10-19 ENCOUNTER — Telehealth: Payer: Self-pay | Admitting: Urology

## 2022-10-19 ENCOUNTER — Other Ambulatory Visit: Payer: Self-pay | Admitting: Urology

## 2022-10-19 NOTE — Telephone Encounter (Signed)
Called patient to remind of pre-seed appts for 10-21-22, spoke with patient and he is aware of these appts.

## 2022-10-19 NOTE — Telephone Encounter (Signed)
Patients sister called in regards to questions about patients upcoming appointments and procedures he has, wondering exactly what it is since patient is having a hard time keeping up with everything. Patients sister is not currently on the DPR but she states that she can 3 way the patient and have him on the phone for this information.   Patients sister Bonita Quin #: [phone]

## 2022-10-21 ENCOUNTER — Ambulatory Visit: Payer: Commercial Managed Care - HMO | Admitting: Radiation Oncology

## 2022-10-21 ENCOUNTER — Ambulatory Visit: Payer: Commercial Managed Care - HMO | Admitting: Urology

## 2022-10-21 DIAGNOSIS — C61 Malignant neoplasm of prostate: Secondary | ICD-10-CM

## 2022-10-22 NOTE — Telephone Encounter (Signed)
Patients sister, Bonita Quin, called and left a voicemail that she would like another call back in regards to message below as she stated the call got disconnected.  Patients sister Bonita Quin #: [phone]

## 2022-11-03 ENCOUNTER — Telehealth: Payer: Self-pay | Admitting: *Deleted

## 2022-11-03 NOTE — Progress Notes (Signed)
  Radiation Oncology         [phone] ________________________________  Name: [name] [name] MRN: [id number]  Date: 11/04/2022  DOB: 03/25/1966  SIMULATION AND TREATMENT PLANNING NOTE PUBIC ARCH STUDY  [id number], Floyde Parkins, FNP  Milderd Meager., *  DIAGNOSIS: 57 y.o. gentleman with Stage T1c adenocarcinoma of the prostate with Gleason score of 4+5, and PSA of 29.4.   Oncology History  Malignant neoplasm of prostate (HCC)  07/22/2022 Cancer Staging   Staging form: Prostate, AJCC 8th Edition - Clinical stage from 07/22/2022: Stage IIIC (cT1c, cN0, cM0, PSA: 29.4, Grade Group: 5) - Signed by Marcello Fennel, PA-C on 10/20/2022 Histopathologic type: Adenocarcinoma, NOS Stage prefix: Initial diagnosis Prostate specific antigen (PSA) range: 20 or greater Gleason primary pattern: 4 Gleason secondary pattern: 5 Gleason score: 9 Histologic grading system: 5 grade system Number of biopsy cores examined: 12 Number of biopsy cores positive: 5 Location of positive needle core biopsies: Both sides   09/22/2022 Initial Diagnosis   Malignant neoplasm of prostate (HCC)       ICD-10-CM   1. Malignant neoplasm of prostate (HCC)  C61       COMPLEX SIMULATION:  The patient presented today for evaluation for possible prostate seed implant. He was brought to the radiation planning suite and placed supine on the CT couch. A 3-dimensional image study set was obtained in upload to the planning computer. There, on each axial slice, I contoured the prostate gland. Then, using three-dimensional radiation planning tools I reconstructed the prostate in view of the structures from the transperineal needle pathway to assess for possible pubic arch interference. In doing so, I did not appreciate any pubic arch interference. Also, the patient's prostate volume was estimated based on the drawn structure. The volume was 40 cc.  Given the pubic arch appearance and prostate volume, patient remains a good  candidate to proceed with prostate seed implant. Today, he freely provided informed written consent to proceed.    PLAN: The patient will undergo prostate seed implant boost to be followed by IMRT.   ________________________________  Artist Pais. Kathrynn Running, M.D.

## 2022-11-03 NOTE — Telephone Encounter (Signed)
CALLED PATIENT TO REMIND OF PRE-SEED APPTS. FOR 11-04-22, SPOKE WITH PATIENT AND HE IS AWARE OF THESE APPTS. 

## 2022-11-03 NOTE — Progress Notes (Signed)
Radiation Oncology         [phone] ________________________________  Outpatient Follow up- Pre-seed visit  Name: [name] [name] MRN: [id number]  Date: 11/04/2022  DOB: 1966-02-25  WU:[id number], Floyde Parkins, FNP  Pete Glatter Danford Bad., *   REFERRING PHYSICIAN: Milderd Meager., *  DIAGNOSIS: 57 y.o. gentleman with Stage T1c adenocarcinoma of the prostate with Gleason score of 4+5, and PSA of 29.4.     ICD-10-CM   1. Malignant neoplasm of prostate (HCC)  C61       HISTORY OF PRESENT ILLNESS: [name] [name] is a 57 y.o. male with a diagnosis of prostate cancer. He originally presented to the ED on 06/09/22 for chest and abdominal pain. CT of the abdomen and pelvis incidentally showed a heterogenous lobular area of enhancement involving the right side of the prostate. Accordingly, he was referred for evaluation in urology by Dr. Pete Glatter on 06/16/22,  digital rectal examination was performed at that time revealing no concerning nodules. PSA obtained on 06/16/22 was elevated at 29.4. The patient proceeded to transrectal ultrasound with 12 biopsies of the prostate on 07/22/22.  The prostate volume measured 40 cc.  Out of 12 core biopsies, 5 were positive.  The maximum Gleason score was 4+5, and this was seen in right mid. Additionally gleason score of 4+4 was seen in the right base, right lateral base, and right lateral mid. A gleason score of 3+3 was seen in the left lateral base.    PSMA PET scan on 08/11/22 showed intense radiotracer accumulation in the right side of the prostate, consistent with primary prostate carcinoma. Fortunately, no evidence of metastatic disease was seen.  The patient reviewed the biopsy results with his urologist and was kindly referred to [place] for discussion of potential radiation treatment options. We initially met the patient on 09/06/22 and he was most interested in proceeding with an upfront brachytherapy boost with SpaceOAR gel placement followed by 5 weeks of daily  external beam radiation to the prostate and regional lymph nodes, concurrent with LT-ADT, for treatment of his disease. He received a 6 month Eligard injection on 09/23/22. He is here today for his pre-procedure imaging for planning and to answer any additional questions he may have about this treatment.   PREVIOUS RADIATION THERAPY: No  PAST MEDICAL HISTORY:  Past Medical History:  Diagnosis Date   Abnormal ECG    a. early repolarization   Arthritis    "my whole left side" (05/03/2014)   Bipolar disorder (HCC)    Bradycardia    a. asymptomatic   CAD in native artery    a. Nonobstructive cath 11/2007;  b. Presented with ST elevation - Nonobstructive cath 08/2011   Chest pain, mid sternal    Coronary artery disease    GERD (gastroesophageal reflux disease)    History of cocaine abuse (HCC)    a. quit ? 2009   History of ETOH abuse    a. drinks 2 "40's" / wk   Marijuana abuse    a. uses ~ 1x /wk or less   Pneumonia    Stomach ulcer    Syncope    a. 12/2010 - presumed to be vasovagal   Tobacco abuse       PAST SURGICAL HISTORY: Past Surgical History:  Procedure Laterality Date   CARDIAC CATHETERIZATION  2009; 08/2011   Hattie Perch 08/06/2011   LEFT HEART CATHETERIZATION WITH CORONARY ANGIOGRAM N/A 08/16/2011   Procedure: LEFT HEART CATHETERIZATION WITH CORONARY ANGIOGRAM;  Surgeon: Donnie Coffin  Eldridge Dace, MD;  Location: [facility] CATH LAB;  Service: Cardiovascular;  Laterality: N/A;    FAMILY HISTORY: No family history on file.  SOCIAL HISTORY:  Social History   Socioeconomic History   Marital status: Widowed    Spouse name: Not on file   Number of children: 7   Years of education: Not on file   Highest education level: 10th grade  Occupational History   Occupation: unemployed  Tobacco Use   Smoking status: Some Days    Packs/day: 0.25    Years: 2.00    Additional pack years: 0.00    Total pack years: 0.50    Types: Cigarettes    Last attempt to quit: 06/24/2015    Years since  quitting: 7.3   Smokeless tobacco: Never  Vaping Use   Vaping Use: Never used  Substance and Sexual Activity   Alcohol use: Not Currently    Alcohol/week: 48.0 standard drinks of alcohol    Types: 48 Cans of beer per week   Drug use: Yes    Types: Marijuana    Comment: Crack one year ago and marijuana was yesterday   Sexual activity: Not Currently  Other Topics Concern   Not on file  Social History Narrative   Lives with his daughter currently and receives SSI.    Social Determinants of Health   Financial Resource Strain: Not on file  Food Insecurity: No Food Insecurity (09/06/2022)   Hunger Vital Sign    Worried About Running Out of Food in the Last Year: Never true    Ran Out of Food in the Last Year: Never true  Transportation Needs: No Transportation Needs (09/06/2022)   PRAPARE - Administrator, Civil Service (Medical): No    Lack of Transportation (Non-Medical): No  Physical Activity: Not on file  Stress: Not on file  Social Connections: Not on file  Intimate Partner Violence: Not At Risk (09/06/2022)   Humiliation, Afraid, Rape, and Kick questionnaire    Fear of Current or Ex-Partner: No    Emotionally Abused: No    Physically Abused: No    Sexually Abused: No    ALLERGIES: Aspirin, Other, Pepperoni [pickled meat], and Tomato  MEDICATIONS:  Current Outpatient Medications  Medication Sig Dispense Refill   cyclobenzaprine (FLEXERIL) 10 MG tablet Take 1 tablet (10 mg total) by mouth 2 (two) times daily as needed for muscle spasms. 20 tablet 0   famotidine (PEPCID) 20 MG tablet Take 1 tablet (20 mg total) by mouth 2 (two) times daily. 60 tablet 0   HYDROcodone-acetaminophen (NORCO/VICODIN) 5-325 MG tablet Take 1 tablet by mouth every 6 (six) hours as needed for moderate pain.     pantoprazole (PROTONIX) 20 MG tablet Take 1 tablet (20 mg total) by mouth daily. 30 tablet 1   sucralfate (CARAFATE) 1 g tablet Take 1 tablet (1 g total) by mouth 4 (four) times daily  -  with meals and at bedtime. 60 tablet 1   No current facility-administered medications for this visit.    REVIEW OF SYSTEMS:   On review of systems, the patient reports that he is doing well overall. He denies any chest pain, shortness of breath, cough, fevers, chills, night sweats, unintended weight changes. He denies any bowel disturbances, and denies abdominal pain, nausea or vomiting. He denies any new musculoskeletal or joint aches or pains. His IPSS was 12, indicating moderate urinary symptoms.  His SHIM was 12, indicating he has moderate erectile dysfunction. A complete review of  systems is obtained and is otherwise negative.     PHYSICAL EXAM:  Wt Readings from Last 3 Encounters:  09/23/22 119 lb (54 kg)  09/06/22 120 lb (54.4 kg)  07/22/22 115 lb (52.2 kg)   Temp Readings from Last 3 Encounters:  09/27/22 98.3 F (36.8 C) (Oral)  09/06/22 (!) 97.2 F (36.2 C) (Temporal)  06/09/22 98 F (36.7 C)   BP Readings from Last 3 Encounters:  09/27/22 118/68  09/23/22 106/65  09/06/22 116/85   Pulse Readings from Last 3 Encounters:  09/27/22 (!) 55  09/23/22 62  09/06/22 (!) 57    /10  In general this is a well appearing African American male in no acute distress. He's alert and oriented x4 and appropriate throughout the examination. Cardiopulmonary assessment is negative for acute distress, and he exhibits normal effort.     KPS = 80  100 - Normal; no complaints; no evidence of disease. 90   - Able to carry on normal activity; minor signs or symptoms of disease. 80   - Normal activity with effort; some signs or symptoms of disease. 76   - Cares for self; unable to carry on normal activity or to do active work. 60   - Requires occasional assistance, but is able to care for most of his personal needs. 50   - Requires considerable assistance and frequent medical care. 40   - Disabled; requires special care and assistance. 30   - Severely disabled; hospital admission is  indicated although death not imminent. 20   - Very sick; hospital admission necessary; active supportive treatment necessary. 10   - Moribund; fatal processes progressing rapidly. 0     - Dead  Karnofsky DA, Abelmann WH, Craver LS and Burchenal [facility] [phone]) The use of the nitrogen mustards in the palliative treatment of carcinoma: with particular reference to bronchogenic carcinoma Cancer 1 634-56  LABORATORY DATA:  Lab Results  Component Value Date   WBC 5.6 06/09/2022   HGB 12.8 (L) 06/09/2022   HCT 38.9 (L) 06/09/2022   MCV 92.8 06/09/2022   PLT 228 06/09/2022   Lab Results  Component Value Date   NA 133 (L) 06/09/2022   K 3.6 06/09/2022   CL 103 06/09/2022   CO2 24 06/09/2022   Lab Results  Component Value Date   ALT 11 06/09/2022   AST 14 (L) 06/09/2022   ALKPHOS 49 06/09/2022   BILITOT 0.5 06/09/2022     RADIOGRAPHY: No results found.    IMPRESSION/PLAN: 1. 57 y.o. gentleman with Stage T1c adenocarcinoma of the prostate with Gleason score of 4+5, and PSA of 29.4.  The patient has elected to proceed with an upfront brachytherapy boost with SpaceOAR gel placement followed by 5 weeks of daily external beam radiation to the prostate and regional lymph nodes, concurrent with LT-ADT, for treatment of his disease.  He has started ADT with a 6 month Eligard injection on 09/23/22. We reviewed the risks, benefits, short and long-term effects associated with brachytherapy and discussed the role of SpaceOAR in reducing the rectal toxicity associated with radiotherapy.  He appears to have a good understanding of his disease and our treatment recommendations which are of curative intent.  He was encouraged to ask questions that were answered to his stated satisfaction. He has freely signed written consent to proceed today in the office and a copy of this document will be placed in his medical record. His procedure is tentatively scheduled for 01/03/23 in collaboration with Dr.  Hall and we will  see him back for his post-procedure visit approximately 3 weeks thereafter to proceed with CT SIM/planning for the 5 weeks of daily external beam radiation that will start shortly thereafter. We look forward to continuing to participate in his care. He knows that he is welcome to call with any questions or concerns at any time in the interim.  I personally spent 30 minutes in this encounter including chart review, reviewing radiological studies, meeting face-to-face with the patient, entering orders and completing documentation.    Marguarite Arbour, MMS, PA-C [place]  [facility] Radiation Oncology Physician Assistant Direct Dial: [phone]  Fax: [phone]

## 2022-11-04 ENCOUNTER — Ambulatory Visit
Admission: RE | Admit: 2022-11-04 | Discharge: 2022-11-04 | Disposition: A | Discharge status: Home / Self Care | Payer: Commercial Managed Care - HMO | Source: Ambulatory Visit | Attending: Radiation Oncology | Admitting: Radiation Oncology

## 2022-11-04 ENCOUNTER — Ambulatory Visit
Admission: RE | Admit: 2022-11-04 | Discharge: 2022-11-04 | Disposition: A | Discharge status: Home / Self Care | Payer: Commercial Managed Care - HMO | Source: Ambulatory Visit | Attending: Urology | Admitting: Urology

## 2022-11-04 ENCOUNTER — Encounter: Payer: Self-pay | Admitting: Urology

## 2022-11-04 VITALS — Resp 18 | Ht 65.0 in | Wt 119.0 lb

## 2022-11-04 DIAGNOSIS — C61 Malignant neoplasm of prostate: Secondary | ICD-10-CM | POA: Insufficient documentation

## 2022-11-04 NOTE — Progress Notes (Signed)
Pre-seed nursing interview for a diagnosis of Stage T1c adenocarcinoma of the prostate with Gleason score of 4+5, and PSA of 29.4.  Patient identity verified x2. Patient reports doing well. No issues conveyed at this time.  Meaningful use complete.  Urinary Management medication(s)- None currently Urology appointment date- 11/17/2022, with Dr. Pete Glatter at [facility] [place]  Resp 18   Ht 5\' 5"  (1.651 m)   Wt 119 lb (54 kg)   BMI 19.80 kg/m   This concludes the interview.   Ruel Favors, LPN

## 2022-11-17 ENCOUNTER — Ambulatory Visit: Payer: Commercial Managed Care - HMO | Admitting: Urology

## 2022-12-08 ENCOUNTER — Encounter (HOSPITAL_BASED_OUTPATIENT_CLINIC_OR_DEPARTMENT_OTHER): Payer: Self-pay | Admitting: Urology

## 2022-12-13 ENCOUNTER — Encounter (HOSPITAL_BASED_OUTPATIENT_CLINIC_OR_DEPARTMENT_OTHER): Payer: Self-pay | Admitting: Urology

## 2022-12-13 NOTE — Progress Notes (Signed)
Spoke w/ via phone for pre-op interview--- Jonny Ruiz and Geronimo Running Lab needs dos----   NONE            Lab results------Current EKG in Epic dated 06/09/22 COVID test -----patient states asymptomatic no test needed Arrive at -------1100 NPO after MN NO Solid Food.  Clear liquids from MN until---1000 Med rec completed Medications to take morning of surgery -----NONE Diabetic medication ----- Patient instructed no nail polish to be worn day of surgery Patient instructed to bring photo id and insurance card day of surgery Patient aware to have Driver (ride ) / caregiver  Sherald Barge- fiance to come with pt, using [employer] transportation.  for 24 hours after surgery  Patient Special Instructions ----- FLEETS enema AM of surgery, pt verbalized understanding. Pre-Op special Instructions ----- Patient verbalized understanding of instructions that were given at this phone interview. Patient denies shortness of breath, chest pain, fever, cough at this phone interview.

## 2022-12-20 ENCOUNTER — Ambulatory Visit (INDEPENDENT_AMBULATORY_CARE_PROVIDER_SITE_OTHER): Payer: Commercial Managed Care - HMO | Admitting: Primary Care

## 2022-12-23 ENCOUNTER — Encounter: Payer: Self-pay | Admitting: Urology

## 2022-12-23 ENCOUNTER — Ambulatory Visit (INDEPENDENT_AMBULATORY_CARE_PROVIDER_SITE_OTHER): Payer: Commercial Managed Care - HMO | Admitting: Urology

## 2022-12-23 VITALS — BP 125/81 | HR 50

## 2022-12-23 DIAGNOSIS — R3129 Other microscopic hematuria: Secondary | ICD-10-CM | POA: Diagnosis not present

## 2022-12-23 DIAGNOSIS — C61 Malignant neoplasm of prostate: Secondary | ICD-10-CM | POA: Diagnosis not present

## 2022-12-23 DIAGNOSIS — Z79818 Long term (current) use of other agents affecting estrogen receptors and estrogen levels: Secondary | ICD-10-CM

## 2022-12-23 MED ORDER — TAMSULOSIN HCL 0.4 MG PO CAPS: 0.4000 mg | ORAL_CAPSULE | Freq: Every day | ORAL | 6 refills | Status: DC

## 2022-12-23 MED ORDER — CIPROFLOXACIN HCL 500 MG PO TABS
500.0000 mg | ORAL_TABLET | Freq: Once | ORAL | Status: AC
  Administered 2022-12-23: 500 mg via ORAL

## 2022-12-23 NOTE — H&P (View-Only) (Signed)
Assessment: 1. Prostate cancer; PSA 29.4; GG 1,4,5; PSMA PET negative; high risk disease   2. Androgen deprivation therapy   3. Microscopic hematuria     Plan: Continue ADT - next Eligard due after 03/22/23. He is scheduled for brachytherapy and SpaceOAR on 01/03/23 followed by IMRT. Begin tamuslosin 0.4 mg daily.  Rx sent. Cystoscopy did not show any bladder abnormalities today. Cipro x 1 following cystoscopy  Procedure: The patient will be scheduled for cystoscopy, brachytherapy, placement of SpaceOAR at [facility].  Surgical request is placed with the surgery schedulers and will be scheduled at the patient's/family request. Informed consent is given as documented below. Anesthesia: General  The patient does not have sleep apnea, history of MRSA, history of VRE, history of cardiac device requiring special anesthetic needs. Patient is stable and considered clear for surgical in an outpatient ambulatory surgery setting as well as patient hospital setting.  Consent for Operation or Procedure: Provider Certification I hereby certify that the nature, purpose, benefits, usual and most frequent risks of, and alternatives to, the operation or procedure have been explained to the patient (or person authorized to sign for the patient) either by me as responsible physician or by the provider who is to perform the operation or procedure. Time spent such that the patient/family has had an opportunity to ask questions, and that those questions have been answered. The patient or the patient's representative has been advised that selected tasks may be performed by assistants to the primary health care provider(s). I believe that the patient (or person authorized to sign for the patient) understands what has been explained, and has consented to the operation or procedure. No guarantees were implied or made.    Chief Complaint:  Chief Complaint  Patient presents with   Prostate Cancer     History of Present Illness:  [name] [name] is a 57 y.o. male who is seen for further evaluation of recently diagnosed high risk prostate cancer and initiation of ADT. He was seen in the emergency department on 06/09/2022 for evaluation of chest and abdominal pain.  CT abdomen and pelvis with contrast was obtained and showed no renal lesions or obstruction, collapsed urinary bladder, heterogeneous lobular enhancement involving the right side of the prostate. No dysuria or gross hematuria.  He reported symptoms of occasional intermittent stream, sensation of incomplete emptying, frequency, and urgency.  No recent changes in his urinary symptoms.  No history of UTIs or prostatitis.  No history of kidney stones. IPSS = 11. No weight loss or bone pain.  He reports a good appetite.  No change in his bowel movements. PSA 1/24:  29.4.  He underwent a transrectal ultrasound and biopsy of the prostate on 07/22/22. PSA: 29.4 ng/ml TRUS volume:  24.6 ml  PSA density:  1.2 Biopsy results:             Gleason score: 4+ 5 = 9, 4 + 4 = 8, 3 + 3 = 6            # positive cores: 4/6 on right    1/6 on left            Location of cancer: mid gland and base  Complications after biopsy: none  PSMA PET scan from 08/10/2022 showed intense radiotracer accumulation in the right side of the prostate and no evidence of metastatic disease.  He has seen Dr. Kathrynn Running with Radiation Oncology and elected to proceed with brachytherapy followed by IMRT.  He  received a 6 month Eligard injection on 09/23/22.  He presents today for for preoperative assessment.  No new lower urinary tract symptoms.  No dysuria or gross hematuria.  He has noted some hot flashes following the Eligard injection.  These are improving.  No bone pain or weight loss.  Portions of the above documentation were copied from a prior visit for review purposes only.   Past Medical History:  Past Medical History:  Diagnosis Date   Abnormal ECG    a.  early repolarization   Arthritis    "my whole left side" (05/03/2014)   Bipolar disorder (HCC)    Bradycardia    a. asymptomatic   CAD in native artery    a. Nonobstructive cath 11/2007;  b. Presented with ST elevation - Nonobstructive cath 08/2011   [facility])    Prostate   Chest pain, mid sternal    Coronary artery disease    GERD (gastroesophageal reflux disease)    History of cocaine abuse (HCC)    a. quit ? 2009   History of ETOH abuse    a. drinks 2 "40's" / wk   Marijuana abuse    a. uses ~ 1x /wk or less   Pneumonia    Stomach ulcer    Syncope    a. 12/2010 - presumed to be vasovagal   Tobacco abuse     Past Surgical History:  Past Surgical History:  Procedure Laterality Date   CARDIAC CATHETERIZATION  2009; 08/2011   Hattie Perch 08/06/2011   LEFT HEART CATHETERIZATION WITH CORONARY ANGIOGRAM N/A 08/16/2011   Procedure: LEFT HEART CATHETERIZATION WITH CORONARY ANGIOGRAM;  Surgeon: Corky Crafts, MD;  Location: [facility] CATH LAB;  Service: Cardiovascular;  Laterality: N/A;    Allergies:  Allergies  Allergen Reactions   Aspirin Other (See Comments)    Aggravates ulcers and "causes chest pain"   Other    Pepperoni [Pickled Meat] Other (See Comments)    Aggravates ulcers   Tomato Other (See Comments)    Foods with tomato sauce aggravate ulcers    Family History:  No family history on file.  Social History:  Social History   Tobacco Use   Smoking status: Some Days    Current packs/day: 0.00    Average packs/day: 0.3 packs/day for 2.0 years (0.5 ttl pk-yrs)    Types: Cigarettes    Start date: 06/23/2013    Last attempt to quit: 06/24/2015    Years since quitting: 7.5   Smokeless tobacco: Never  Vaping Use   Vaping status: Never Used  Substance Use Topics   Alcohol use: Not Currently    Alcohol/week: 48.0 standard drinks of alcohol    Types: 48 Cans of beer per week   Drug use: Yes    Types: Marijuana    Comment: Crack one year ago and marijuana was  yesterday    ROS: Constitutional:  Negative for fever, chills, weight loss CV: Negative for chest pain, previous MI, hypertension Respiratory:  Negative for shortness of breath, wheezing, sleep apnea, frequent cough GI:  Negative for nausea, vomiting, bloody stool, GERD  Physical exam: BP 125/81   Pulse (!) 50  GENERAL APPEARANCE:  Well appearing, well developed, well nourished, NAD HEENT:  Atraumatic, normocephalic, oropharynx clear NECK:  Supple without lymphadenopathy or thyromegaly ABDOMEN:  Soft, non-tender, no masses EXTREMITIES:  Moves all extremities well, without clubbing, cyanosis, or edema NEUROLOGIC:  Alert and oriented x 3, normal gait, CN II-XII grossly intact MENTAL STATUS:  appropriate  BACK:  Non-tender to palpation, No CVAT SKIN:  Warm, dry, and intact    Results: U/A: >30 RBC  Procedure:  Flexible Cystourethroscopy  Pre-operative Diagnosis: Microscopic hematuria  Post-operative Diagnosis: Microscopic hematuria  Anesthesia:  local with lidocaine jelly  Surgical Narrative:  After appropriate informed consent was obtained, the patient was prepped and draped in the usual sterile fashion in the supine position.  The patient was correctly identified and the proper procedure delineated prior to proceeding.  Sterile lidocaine gel was instilled in the urethra. The flexible cystoscope was introduced without difficulty.  Findings:  Anterior urethra: Normal  Posterior urethra: Lateral lobe enlargement with median lobe  Bladder:  no papillary lesions seen  Ureteral orifices: normal  Additional findings: none  Saline bladder wash for cytology was not performed.    The cystoscope was then removed.  The patient tolerated the procedure well.

## 2022-12-23 NOTE — Addendum Note (Signed)
Addended by: Lizbeth Bark on: 12/23/2022 12:29 PM   Modules accepted: Orders

## 2022-12-23 NOTE — Progress Notes (Signed)
Assessment: 1. Prostate cancer; PSA 29.4; GG 1,4,5; PSMA PET negative; high risk disease   2. Androgen deprivation therapy   3. Microscopic hematuria     Plan: Continue ADT - next Eligard due after 03/22/23. He is scheduled for brachytherapy and SpaceOAR on 01/03/23 followed by IMRT. Begin tamuslosin 0.4 mg daily.  Rx sent. Cystoscopy did not show any bladder abnormalities today. Cipro x 1 following cystoscopy  Procedure: The patient will be scheduled for cystoscopy, brachytherapy, placement of SpaceOAR at [facility].  Surgical request is placed with the surgery schedulers and will be scheduled at the patient's/family request. Informed consent is given as documented below. Anesthesia: General  The patient does not have sleep apnea, history of MRSA, history of VRE, history of cardiac device requiring special anesthetic needs. Patient is stable and considered clear for surgical in an outpatient ambulatory surgery setting as well as patient hospital setting.  Consent for Operation or Procedure: Provider Certification I hereby certify that the nature, purpose, benefits, usual and most frequent risks of, and alternatives to, the operation or procedure have been explained to the patient (or person authorized to sign for the patient) either by me as responsible physician or by the provider who is to perform the operation or procedure. Time spent such that the patient/family has had an opportunity to ask questions, and that those questions have been answered. The patient or the patient's representative has been advised that selected tasks may be performed by assistants to the primary health care provider(s). I believe that the patient (or person authorized to sign for the patient) understands what has been explained, and has consented to the operation or procedure. No guarantees were implied or made.    Chief Complaint:  Chief Complaint  Patient presents with   Prostate Cancer     History of Present Illness:  [name] [name] is a 57 y.o. male who is seen for further evaluation of recently diagnosed high risk prostate cancer and initiation of ADT. He was seen in the emergency department on 06/09/2022 for evaluation of chest and abdominal pain.  CT abdomen and pelvis with contrast was obtained and showed no renal lesions or obstruction, collapsed urinary bladder, heterogeneous lobular enhancement involving the right side of the prostate. No dysuria or gross hematuria.  He reported symptoms of occasional intermittent stream, sensation of incomplete emptying, frequency, and urgency.  No recent changes in his urinary symptoms.  No history of UTIs or prostatitis.  No history of kidney stones. IPSS = 11. No weight loss or bone pain.  He reports a good appetite.  No change in his bowel movements. PSA 1/24:  29.4.  He underwent a transrectal ultrasound and biopsy of the prostate on 07/22/22. PSA: 29.4 ng/ml TRUS volume:  24.6 ml  PSA density:  1.2 Biopsy results:             Gleason score: 4+ 5 = 9, 4 + 4 = 8, 3 + 3 = 6            # positive cores: 4/6 on right    1/6 on left            Location of cancer: mid gland and base  Complications after biopsy: none  PSMA PET scan from 08/10/2022 showed intense radiotracer accumulation in the right side of the prostate and no evidence of metastatic disease.  He has seen Dr. Kathrynn Running with Radiation Oncology and elected to proceed with brachytherapy followed by IMRT.  He  received a 6 month Eligard injection on 09/23/22.  He presents today for for preoperative assessment.  No new lower urinary tract symptoms.  No dysuria or gross hematuria.  He has noted some hot flashes following the Eligard injection.  These are improving.  No bone pain or weight loss.  Portions of the above documentation were copied from a prior visit for review purposes only.   Past Medical History:  Past Medical History:  Diagnosis Date   Abnormal ECG    a.  early repolarization   Arthritis    "my whole left side" (05/03/2014)   Bipolar disorder (HCC)    Bradycardia    a. asymptomatic   CAD in native artery    a. Nonobstructive cath 11/2007;  b. Presented with ST elevation - Nonobstructive cath 08/2011   Cancer [facility])    Prostate   Chest pain, mid sternal    Coronary artery disease    GERD (gastroesophageal reflux disease)    History of cocaine abuse (HCC)    a. quit ? 2009   History of ETOH abuse    a. drinks 2 "40's" / wk   Marijuana abuse    a. uses ~ 1x /wk or less   Pneumonia    Stomach ulcer    Syncope    a. 12/2010 - presumed to be vasovagal   Tobacco abuse     Past Surgical History:  Past Surgical History:  Procedure Laterality Date   CARDIAC CATHETERIZATION  2009; 08/2011   Hattie Perch 08/06/2011   LEFT HEART CATHETERIZATION WITH CORONARY ANGIOGRAM N/A 08/16/2011   Procedure: LEFT HEART CATHETERIZATION WITH CORONARY ANGIOGRAM;  Surgeon: Corky Crafts, MD;  Location: [facility] CATH LAB;  Service: Cardiovascular;  Laterality: N/A;    Allergies:  Allergies  Allergen Reactions   Aspirin Other (See Comments)    Aggravates ulcers and "causes chest pain"   Other    Pepperoni [Pickled Meat] Other (See Comments)    Aggravates ulcers   Tomato Other (See Comments)    Foods with tomato sauce aggravate ulcers    Family History:  No family history on file.  Social History:  Social History   Tobacco Use   Smoking status: Some Days    Current packs/day: 0.00    Average packs/day: 0.3 packs/day for 2.0 years (0.5 ttl pk-yrs)    Types: Cigarettes    Start date: 06/23/2013    Last attempt to quit: 06/24/2015    Years since quitting: 7.5   Smokeless tobacco: Never  Vaping Use   Vaping status: Never Used  Substance Use Topics   Alcohol use: Not Currently    Alcohol/week: 48.0 standard drinks of alcohol    Types: 48 Cans of beer per week   Drug use: Yes    Types: Marijuana    Comment: Crack one year ago and marijuana was  yesterday    ROS: Constitutional:  Negative for fever, chills, weight loss CV: Negative for chest pain, previous MI, hypertension Respiratory:  Negative for shortness of breath, wheezing, sleep apnea, frequent cough GI:  Negative for nausea, vomiting, bloody stool, GERD  Physical exam: BP 125/81   Pulse (!) 50  GENERAL APPEARANCE:  Well appearing, well developed, well nourished, NAD HEENT:  Atraumatic, normocephalic, oropharynx clear NECK:  Supple without lymphadenopathy or thyromegaly ABDOMEN:  Soft, non-tender, no masses EXTREMITIES:  Moves all extremities well, without clubbing, cyanosis, or edema NEUROLOGIC:  Alert and oriented x 3, normal gait, CN II-XII grossly intact MENTAL STATUS:  appropriate  BACK:  Non-tender to palpation, No CVAT SKIN:  Warm, dry, and intact    Results: U/A: >30 RBC  Procedure:  Flexible Cystourethroscopy  Pre-operative Diagnosis: Microscopic hematuria  Post-operative Diagnosis: Microscopic hematuria  Anesthesia:  local with lidocaine jelly  Surgical Narrative:  After appropriate informed consent was obtained, the patient was prepped and draped in the usual sterile fashion in the supine position.  The patient was correctly identified and the proper procedure delineated prior to proceeding.  Sterile lidocaine gel was instilled in the urethra. The flexible cystoscope was introduced without difficulty.  Findings:  Anterior urethra: Normal  Posterior urethra: Lateral lobe enlargement with median lobe  Bladder:  no papillary lesions seen  Ureteral orifices: normal  Additional findings: none  Saline bladder wash for cytology was not performed.    The cystoscope was then removed.  The patient tolerated the procedure well.

## 2022-12-24 LAB — URINALYSIS, ROUTINE W REFLEX MICROSCOPIC
Bilirubin, UA: NEGATIVE
Glucose, UA: NEGATIVE
Ketones, UA: NEGATIVE
Leukocytes,UA: NEGATIVE
Nitrite, UA: NEGATIVE
Protein,UA: NEGATIVE
Specific Gravity, UA: 1.025 (ref 1.005–1.030)
Urobilinogen, Ur: 0.2 mg/dL (ref 0.2–1.0)
pH, UA: 6.5 (ref 5.0–7.5)

## 2022-12-24 LAB — MICROSCOPIC EXAMINATION: RBC, Urine: >30 /hpf — AB (ref 0–2)

## 2022-12-27 NOTE — Progress Notes (Signed)
RN spoke with patient to confirm next step in treatment plan recommendation of brachy boost, followed by 5 weeks of daily radiation approximately 3 weeks after brachy.    Patient confirmed he could benefit from transportation services when he starts the 5 weeks of daily radiation.  RN will place request for transportation.    Plan of care in progress.

## 2022-12-31 ENCOUNTER — Telehealth: Payer: Self-pay | Admitting: *Deleted

## 2022-12-31 NOTE — Progress Notes (Signed)
Pt's daughter , Delice Bison, called inquired about address of our facility [facility]) for her dad's surgery on 01-03-2023.  Noted pt and his fiance had pre-op interview done per progress note on  12-13-2022, with [facility].  Delice Bison stated pt and fiance (responsible person/ caregiver) will both be using [employer] transportation day of surgery to facility and at discharge. Told Delice Bison to left Alvino Chapel, fiance have number of transportation number to be given at check in since discharge nurse can call them when pt ready to go.  And gave Delice Bison front desk number to give to transportation in case they need to verify pt is having surgery that day. All other questions answered.

## 2022-12-31 NOTE — Telephone Encounter (Signed)
Called patient to remind of procedure for 01-03-23, spoke with patient's dauighter- Delice Bison and she is aware of this procedure

## 2023-01-02 NOTE — Progress Notes (Signed)
  Radiation Oncology         [phone] ________________________________  Name: [name] [name] MRN: [id number]  Date: 01/03/2023  DOB: [birth year]       Prostate Seed Implant  WU:[id number], Floyde Parkins, FNP  No ref. provider found  DIAGNOSIS:   57 y.o. gentleman with Stage T1c adenocarcinoma of the prostate with Gleason score of 4+5, and PSA of 29.4.   Oncology History  Malignant neoplasm of prostate (HCC)  07/22/2022 Cancer Staging   Staging form: Prostate, AJCC 8th Edition - Clinical stage from 07/22/2022: Stage IIIC (cT1c, cN0, cM0, PSA: 29.4, Grade Group: 5) - Signed by Marcello Fennel, PA-C on 10/20/2022 Histopathologic type: Adenocarcinoma, NOS Stage prefix: Initial diagnosis Prostate specific antigen (PSA) range: 20 or greater Gleason primary pattern: 4 Gleason secondary pattern: 5 Gleason score: 9 Histologic grading system: 5 grade system Number of biopsy cores examined: 12 Number of biopsy cores positive: 5 Location of positive needle core biopsies: Both sides   09/22/2022 Initial Diagnosis   Malignant neoplasm of prostate (HCC)     No diagnosis found.  PROCEDURE: Insertion of radioactive I-125 seeds into the prostate gland.  RADIATION DOSE: 110 Gy, boost therapy.  TECHNIQUE: [name] was brought to the operating room with the urologist. He was placed in the dorsolithotomy position. He was catheterized and a rectal tube was inserted. The perineum was shaved, prepped and draped. The ultrasound probe was then introduced by me into the rectum to see the prostate gland.  TREATMENT DEVICE: I attached the needle grid to the ultrasound probe stand and anchor needles were placed.  3D PLANNING: The prostate was imaged in 3D using a sagittal sweep of the prostate probe. These images were transferred to the planning computer. There, the prostate, urethra and rectum were defined on each axial reconstructed image. Then, the software created an optimized 3D plan and a few seed  positions were adjusted. The quality of the plan was reviewed using [facility] information for the target and the following two organs at risk:  Urethra and Rectum.  Then the accepted plan was printed and handed off to the radiation therapist.  Under my supervision, the custom loading of the seeds and spacers was carried out using the quick loader.  These pre-loaded needles were then placed into the needle holder.Marland Kitchen  PROSTATE VOLUME STUDY:  Using transrectal ultrasound the volume of the prostate was verified to be 38 cc.  SPECIAL TREATMENT PROCEDURE/SUPERVISION AND HANDLING: The pre-loaded needles were then delivered by the urologist under sagittal guidance. A total of 17 needles were used to deposit 59 seeds in the prostate gland. The individual seed activity was 0.352 mCi.  SpaceOAR:  Yes  COMPLEX SIMULATION: At the end of the procedure, an anterior radiograph of the pelvis was obtained to document seed positioning and count. Cystoscopy was performed by the urologist to check the urethra and bladder.  MICRODOSIMETRY: At the end of the procedure, the patient was emitting 0.11 mR/hr at 1 meter. Accordingly, he was considered safe for hospital discharge.  PLAN: The patient will return to the radiation oncology clinic for post implant CT dosimetry in three weeks.   ________________________________  Artist Pais Kathrynn Running, M.D.

## 2023-01-03 ENCOUNTER — Ambulatory Visit (HOSPITAL_BASED_OUTPATIENT_CLINIC_OR_DEPARTMENT_OTHER)
Admission: RE | Admit: 2023-01-03 | Discharge: 2023-01-03 | Disposition: A | Discharge status: Home / Self Care | Payer: Commercial Managed Care - HMO | Attending: Urology | Admitting: Urology

## 2023-01-03 ENCOUNTER — Encounter (HOSPITAL_BASED_OUTPATIENT_CLINIC_OR_DEPARTMENT_OTHER)
Admission: RE | Disposition: A | Discharge status: Home / Self Care | Payer: Self-pay | Source: Home / Self Care | Attending: Urology

## 2023-01-03 ENCOUNTER — Ambulatory Visit (HOSPITAL_BASED_OUTPATIENT_CLINIC_OR_DEPARTMENT_OTHER): Payer: Commercial Managed Care - HMO | Admitting: Anesthesiology

## 2023-01-03 ENCOUNTER — Other Ambulatory Visit: Payer: Self-pay

## 2023-01-03 ENCOUNTER — Encounter (HOSPITAL_BASED_OUTPATIENT_CLINIC_OR_DEPARTMENT_OTHER): Payer: Self-pay | Admitting: Urology

## 2023-01-03 ENCOUNTER — Ambulatory Visit (HOSPITAL_COMMUNITY): Payer: Commercial Managed Care - HMO

## 2023-01-03 DIAGNOSIS — F418 Other specified anxiety disorders: Secondary | ICD-10-CM

## 2023-01-03 DIAGNOSIS — F17210 Nicotine dependence, cigarettes, uncomplicated: Secondary | ICD-10-CM | POA: Diagnosis not present

## 2023-01-03 DIAGNOSIS — F319 Bipolar disorder, unspecified: Secondary | ICD-10-CM | POA: Diagnosis not present

## 2023-01-03 DIAGNOSIS — C61 Malignant neoplasm of prostate: Secondary | ICD-10-CM

## 2023-01-03 DIAGNOSIS — I2510 Atherosclerotic heart disease of native coronary artery without angina pectoris: Secondary | ICD-10-CM | POA: Diagnosis not present

## 2023-01-03 DIAGNOSIS — Z79899 Other long term (current) drug therapy: Secondary | ICD-10-CM | POA: Insufficient documentation

## 2023-01-03 DIAGNOSIS — R3129 Other microscopic hematuria: Secondary | ICD-10-CM | POA: Insufficient documentation

## 2023-01-03 HISTORY — PX: RADIOACTIVE SEED IMPLANT: SHX5150

## 2023-01-03 HISTORY — DX: Malignant (primary) neoplasm, unspecified: C80.1

## 2023-01-03 HISTORY — PX: CYSTOSCOPY: SHX5120

## 2023-01-03 HISTORY — PX: SPACE OAR INSTILLATION: SHX6769

## 2023-01-03 SURGERY — INSERTION, RADIATION SOURCE, PROSTATE
Anesthesia: General

## 2023-01-03 MED ORDER — FENTANYL CITRATE (PF) 100 MCG/2ML IJ SOLN: 25.0000 ug | INTRAMUSCULAR | Status: DC | PRN

## 2023-01-03 MED ORDER — LIDOCAINE HCL (PF) 2 % IJ SOLN
INTRAMUSCULAR | Status: AC
  Filled 2023-01-03: qty 5

## 2023-01-03 MED ORDER — PROPOFOL 10 MG/ML IV BOLUS
INTRAVENOUS | Status: AC
  Filled 2023-01-03: qty 20

## 2023-01-03 MED ORDER — IOPAMIDOL (ISOVUE-300) INJECTION 61%
INTRAVENOUS | Status: DC | PRN
  Administered 2023-01-03: 7 mL

## 2023-01-03 MED ORDER — SODIUM CHLORIDE 0.9 % IV SOLN: INTRAVENOUS | Status: DC | PRN

## 2023-01-03 MED ORDER — ROCURONIUM BROMIDE 10 MG/ML (PF) SYRINGE
PREFILLED_SYRINGE | INTRAVENOUS | Status: AC
  Filled 2023-01-03: qty 10

## 2023-01-03 MED ORDER — DEXAMETHASONE SODIUM PHOSPHATE 10 MG/ML IJ SOLN
INTRAMUSCULAR | Status: DC | PRN
  Administered 2023-01-03: 5 mg via INTRAVENOUS

## 2023-01-03 MED ORDER — CIPROFLOXACIN IN D5W 400 MG/200ML IV SOLN
400.0000 mg | INTRAVENOUS | Status: AC
  Administered 2023-01-03: 400 mg via INTRAVENOUS

## 2023-01-03 MED ORDER — FENTANYL CITRATE (PF) 100 MCG/2ML IJ SOLN
INTRAMUSCULAR | Status: AC
  Filled 2023-01-03: qty 2

## 2023-01-03 MED ORDER — LACTATED RINGERS IV SOLN: INTRAVENOUS | Status: DC

## 2023-01-03 MED ORDER — KETOROLAC TROMETHAMINE 30 MG/ML IJ SOLN
INTRAMUSCULAR | Status: DC | PRN
  Administered 2023-01-03: 15 mg via INTRAVENOUS

## 2023-01-03 MED ORDER — LIDOCAINE 2% (20 MG/ML) 5 ML SYRINGE
INTRAMUSCULAR | Status: DC | PRN
  Administered 2023-01-03: 60 mg via INTRAVENOUS

## 2023-01-03 MED ORDER — FLEET ENEMA 7-19 GM/118ML RE ENEM: 1.0000 | ENEMA | Freq: Once | RECTAL | Status: DC

## 2023-01-03 MED ORDER — FENTANYL CITRATE (PF) 100 MCG/2ML IJ SOLN
INTRAMUSCULAR | Status: DC | PRN
  Administered 2023-01-03 (×2): 50 ug via INTRAVENOUS
  Administered 2023-01-03: 100 ug via INTRAVENOUS

## 2023-01-03 MED ORDER — KETOROLAC TROMETHAMINE 30 MG/ML IJ SOLN
INTRAMUSCULAR | Status: AC
  Filled 2023-01-03: qty 1

## 2023-01-03 MED ORDER — MIDAZOLAM HCL 2 MG/2ML IJ SOLN
INTRAMUSCULAR | Status: AC
  Filled 2023-01-03: qty 2

## 2023-01-03 MED ORDER — DEXAMETHASONE SODIUM PHOSPHATE 10 MG/ML IJ SOLN
INTRAMUSCULAR | Status: AC
  Filled 2023-01-03: qty 1

## 2023-01-03 MED ORDER — ONDANSETRON HCL 4 MG/2ML IJ SOLN
INTRAMUSCULAR | Status: AC
  Filled 2023-01-03: qty 2

## 2023-01-03 MED ORDER — MIDAZOLAM HCL 5 MG/5ML IJ SOLN
INTRAMUSCULAR | Status: DC | PRN
  Administered 2023-01-03: 2 mg via INTRAVENOUS

## 2023-01-03 MED ORDER — ACETAMINOPHEN 500 MG PO TABS: 1000.0000 mg | ORAL_TABLET | Freq: Once | ORAL | Status: DC

## 2023-01-03 MED ORDER — ROCURONIUM BROMIDE 10 MG/ML (PF) SYRINGE
PREFILLED_SYRINGE | INTRAVENOUS | Status: DC | PRN
  Administered 2023-01-03: 50 mg via INTRAVENOUS

## 2023-01-03 MED ORDER — FENTANYL CITRATE (PF) 250 MCG/5ML IJ SOLN
INTRAMUSCULAR | Status: AC
  Filled 2023-01-03: qty 5

## 2023-01-03 MED ORDER — SODIUM CHLORIDE FLUSH 0.9 % IV SOLN
INTRAVENOUS | Status: DC | PRN
Start: 2023-01-03 — End: 2023-01-03
  Administered 2023-01-03: 10 mL

## 2023-01-03 MED ORDER — CIPROFLOXACIN IN D5W 400 MG/200ML IV SOLN
INTRAVENOUS | Status: AC
  Filled 2023-01-03: qty 200

## 2023-01-03 MED ORDER — PROPOFOL 10 MG/ML IV BOLUS
INTRAVENOUS | Status: DC | PRN
  Administered 2023-01-03: 130 mg via INTRAVENOUS

## 2023-01-03 MED ORDER — STERILE WATER FOR IRRIGATION IR SOLN
Status: DC | PRN
  Administered 2023-01-03: 500 mL

## 2023-01-03 MED ORDER — ACETAMINOPHEN 500 MG PO TABS
ORAL_TABLET | ORAL | Status: AC
  Filled 2023-01-03: qty 2

## 2023-01-03 MED ORDER — SODIUM CHLORIDE 0.9 % IR SOLN
Status: DC | PRN
  Administered 2023-01-03: 1000 mL via INTRAVESICAL

## 2023-01-03 SURGICAL SUPPLY — 41 items
BAG DRN RND TRDRP ANRFLXCHMBR (UROLOGICAL SUPPLIES) ×1
BAG URINE DRAIN 2000ML AR STRL (UROLOGICAL SUPPLIES) ×1 IMPLANT
BLADE CLIPPER SENSICLIP SURGIC (BLADE) ×1 IMPLANT
Bard quick link seeds IMPLANT
CATH FOLEY 2WAY SLVR 5CC 16FR (CATHETERS) ×2 IMPLANT
CATH ROBINSON RED A/P 20FR (CATHETERS) ×1 IMPLANT
CLOTH BEACON ORANGE TIMEOUT ST (SAFETY) ×1 IMPLANT
COVER BACK TABLE 60X90IN (DRAPES) ×1 IMPLANT
COVER MAYO STAND STRL (DRAPES) ×1 IMPLANT
DRSG TEGADERM 4X4.75 (GAUZE/BANDAGES/DRESSINGS) ×1 IMPLANT
DRSG TEGADERM 8X12 (GAUZE/BANDAGES/DRESSINGS) ×1 IMPLANT
GAUZE SPONGE 4X4 3PLY NS LF (GAUZE/BANDAGES/DRESSINGS) IMPLANT
GEL ULTRASOUND 20GR AQUASONIC (MISCELLANEOUS) ×1 IMPLANT
GLOVE BIO SURGEON STRL SZ 6.5 (GLOVE) IMPLANT
GLOVE BIO SURGEON STRL SZ7.5 (GLOVE) IMPLANT
GLOVE BIO SURGEON STRL SZ8 (GLOVE) IMPLANT
GLOVE BIOGEL PI IND STRL 6.5 (GLOVE) IMPLANT
GLOVE SURG LX STRL 7.5 STRW (GLOVE) ×2 IMPLANT
GOWN STRL REUS W/ TWL XL LVL3 (GOWN DISPOSABLE) ×1 IMPLANT
GOWN STRL REUS W/TWL XL LVL3 (GOWN DISPOSABLE) ×1
GRID BRACH TEMP 18GA 2.8X3X.75 (MISCELLANEOUS) ×1 IMPLANT
HOLDER FOLEY CATH W/STRAP (MISCELLANEOUS) ×1 IMPLANT
IMPL SPACEOAR VUE SYSTEM (Spacer) ×1 IMPLANT
IMPLANT SPACEOAR VUE SYSTEM (Spacer) ×1 IMPLANT
IV NS 1000ML (IV SOLUTION) ×1
IV NS 1000ML BAXH (IV SOLUTION) ×1 IMPLANT
KIT TURNOVER CYSTO (KITS) IMPLANT
MARKER SKIN DUAL TIP RULER LAB (MISCELLANEOUS) ×1 IMPLANT
NDL BRACHY 18G 5PK (NEEDLE) ×4 IMPLANT
NDL BRACHY 18G SINGLE (NEEDLE) IMPLANT
NDL PK MORGANSTERN STABILIZ (NEEDLE) ×1 IMPLANT
NEEDLE BRACHY 18G 5PK (NEEDLE) ×4 IMPLANT
NEEDLE BRACHY 18G SINGLE (NEEDLE) IMPLANT
NEEDLE PK MORGANSTERN STABILIZ (NEEDLE) ×1 IMPLANT
PACK CYSTO (CUSTOM PROCEDURE TRAY) ×1 IMPLANT
SHEATH ULTRASOUND LF (SHEATH) IMPLANT
SHEATH ULTRASOUND LTX NONSTRL (SHEATH) IMPLANT
SLEEVE SCD COMPRESS KNEE MED (STOCKING) ×1 IMPLANT
SYR 10ML LL (SYRINGE) ×1 IMPLANT
TOWEL OR 17X24 6PK STRL BLUE (TOWEL DISPOSABLE) ×1 IMPLANT
UNDERPAD 30X36 HEAVY ABSORB (UNDERPADS AND DIAPERS) ×2 IMPLANT

## 2023-01-03 NOTE — Anesthesia Postprocedure Evaluation (Signed)
Anesthesia Post Note  Patient: [name] [name]  Procedure(s) Performed: RADIOACTIVE SEED IMPLANT/BRACHYTHERAPY IMPLANT SPACE OAR INSTILLATION CYSTOSCOPY     Patient location during evaluation: PACU Anesthesia Type: General Level of consciousness: awake and alert Pain management: pain level controlled Vital Signs Assessment: post-procedure vital signs reviewed and stable Respiratory status: spontaneous breathing, nonlabored ventilation, respiratory function stable and patient connected to nasal cannula oxygen Cardiovascular status: blood pressure returned to baseline and stable Postop Assessment: no apparent nausea or vomiting Anesthetic complications: no   No notable events documented.  Last Vitals:  Vitals:   01/03/23 1524 01/03/23 1611  BP:  128/73  Pulse: 69 (!) 53  Resp: 17 18  Temp: 36.8 C 36.8 C  SpO2: 100% 99%    Last Pain:  Vitals:   01/03/23 1611  TempSrc:   PainSc: 0-No pain                 Neriyah Cercone

## 2023-01-03 NOTE — Op Note (Signed)
Preoperative diagnosis: Clinically localized adenocarcinoma of the prostate   Postoperative diagnosis: Clinically localized adenocarcinoma of the prostate  Procedure: 1) Transperineal placement of radioactive seeds into the prostate                    2) Cystoscopy                    3) Insertion of SpaceOAR hydrogel   Surgeon: Concepcion Living M.D.  Radiation oncologist: Margaretmary Dys, MD  Anesthesia: General  EBL: Minimal  Complications: None  Indication: [name] [name] is a 57 y.o. gentleman with clinically localized prostate cancer. After discussing management options for treatment, he elected to proceed with radiotherapy. He presents today for the above procedures. The potential risks, complications, alternative options, and expected recovery course have been discussed in detail with the patient and he has provided informed consent to proceed.  Description of procedure: The patient was taken to the operating room and general anesthesia was induced. He was administered preoperative antibiotics, placed in the dorsal lithotomy position, and prepped and draped in the usual sterile fashion. Next, intraoperative transrectal ultrasonography was utilized for real-time intraoperative planning by the radiation oncology team. Once the treatment plan was completed and the seed strands created, stranded iodine 125 radiation seeds were placed utilizing a brachytherapy perineal template. 59 radioactive iodine 125 seeds into the prostate through 17 catheter needles.  The brachytherapy template was then removed.  A site in the midline was selected on the perineum for placement of an 18 g needle with saline.  The needle was advanced above the rectum and below Denonvillier's fascia to the mid gland and confirmed to be in the midline on transverse imaging.  One cc of saline was injected confirming appropriate expansion of this space.  A total of 5 cc of saline was then injected to open the space further  bilaterally.  The saline syringe was then removed and the SpaceOAR hydrogel was injected with good distribution bilaterally. Position of the radiation seeds was confirmed on fluoroscopic imaging.  Flexible cystoscopy was then performed and no seeds were identified within the bladder.  No bladder tumors, stones, or other mucosal pathology was identified within the bladder. He tolerated the procedure well and without complications. He was able to be transferred to the recovery unit in satisfactory condition.  He was given a voiding trial in the PACU.

## 2023-01-03 NOTE — Anesthesia Preprocedure Evaluation (Addendum)
Anesthesia Evaluation  Patient identified by MRN, date of birth, ID band Patient awake    Reviewed: Allergy & Precautions, H&P , NPO status , Patient's Chart, lab work & pertinent test results  Airway Mallampati: II  TM Distance: >3 FB Neck ROM: Full    Dental no notable dental hx. (+) Missing, Dental Advisory Given   Pulmonary Current Smoker and Patient abstained from smoking.   Pulmonary exam normal breath sounds clear to auscultation       Cardiovascular + CAD   Rhythm:Regular Rate:Normal     Neuro/Psych   Anxiety Depression Bipolar Disorder   negative neurological ROS     GI/Hepatic Neg liver ROS, PUD,GERD  ,,  Endo/Other  negative endocrine ROS    Renal/GU negative Renal ROS  negative genitourinary   Musculoskeletal  (+) Arthritis , Osteoarthritis,    Abdominal   Peds  Hematology negative hematology ROS (+)   Anesthesia Other Findings   Reproductive/Obstetrics negative OB ROS                             Anesthesia Physical Anesthesia Plan  ASA: 3  Anesthesia Plan: General   Post-op Pain Management: Tylenol PO (pre-op)*   Induction: Intravenous  PONV Risk Score and Plan: 2 and Ondansetron, Dexamethasone and Midazolam  Airway Management Planned: Oral ETT  Additional Equipment:   Intra-op Plan:   Post-operative Plan: Extubation in OR  Informed Consent: I have reviewed the patients History and Physical, chart, labs and discussed the procedure including the risks, benefits and alternatives for the proposed anesthesia with the patient or authorized representative who has indicated his/her understanding and acceptance.     Dental advisory given  Plan Discussed with: CRNA  Anesthesia Plan Comments:        Anesthesia Quick Evaluation

## 2023-01-03 NOTE — Anesthesia Procedure Notes (Signed)
Procedure Name: Intubation Date/Time: 01/03/2023 1:20 PM  Performed by: Briant Sites, CRNAPre-anesthesia Checklist: Patient identified, Emergency Drugs available, Suction available and Patient being monitored Patient Re-evaluated:Patient Re-evaluated prior to induction Oxygen Delivery Method: Circle system utilized Preoxygenation: Pre-oxygenation with 100% oxygen Induction Type: IV induction Ventilation: Mask ventilation without difficulty Laryngoscope Size: Mac and 4 Grade View: Grade I Tube type: Oral Tube size: 8.0 mm Number of attempts: 1 Airway Equipment and Method: Stylet Placement Confirmation: ETT inserted through vocal cords under direct vision, positive ETCO2 and breath sounds checked- equal and bilateral Secured at: 22 cm Tube secured with: Tape Dental Injury: Teeth and Oropharynx as per pre-operative assessment

## 2023-01-03 NOTE — Transfer of Care (Signed)
Immediate Anesthesia Transfer of Care Note  Patient: CHIDOZIE BREGENZER  Procedure(s) Performed: RADIOACTIVE SEED IMPLANT/BRACHYTHERAPY IMPLANT SPACE OAR INSTILLATION CYSTOSCOPY  Patient Location: PACU  Anesthesia Type:General  Level of Consciousness: awake, crying  Airway & Oxygen Therapy: Patient Spontanous Breathing  Post-op Assessment: Report given to RN  Post vital signs: Reviewed and stable  Last Vitals:  Vitals Value Taken Time  BP 137/83   Temp    Pulse 91 01/03/23 1448  Resp 14 01/03/23 1448  SpO2 97 % 01/03/23 1448  Vitals shown include unfiled device data.  Last Pain:  Vitals:   01/03/23 1114  TempSrc: Oral  PainSc: 8          Complications: No notable events documented.

## 2023-01-03 NOTE — Interval H&P Note (Signed)
History and Physical Interval Note:  01/03/2023 11:53 AM  [name] [name]  has presented today for surgery, with the diagnosis of PROSTATE CANCER.  The various methods of treatment have been discussed with the patient and family. After consideration of risks, benefits and other options for treatment, the patient has consented to  Procedure(s) with comments: RADIOACTIVE SEED IMPLANT/BRACHYTHERAPY IMPLANT (N/A) - 90 MINUTES NEEDED FOR CASE SPACE OAR INSTILLATION (N/A) CYSTOSCOPY (N/A) as a surgical intervention.  The patient's history has been reviewed, patient examined, no change in status, stable for surgery.  I have reviewed the patient's chart and labs.  Questions were answered to the patient's satisfaction.     Joline Maxcy

## 2023-01-03 NOTE — Discharge Instructions (Addendum)
Radioactive Seed Implant Home Care Instructions ° ° °Activity:  Rest for the remainder of the day.  Do not drive or operate equipment today.  You may resume normal activities in a few days as instructed by your physician, without risk of harmful radiation exposure to those around you, provided you follow the time and distance precautions on the Radiation Oncology Instruction Sheet. ° ° °Meals: Drink plenty of lipuids and eat light foods, such as gelatin or soup this evening .  You may return to normal meal plan tomorrow. ° °Return To Work: You may return to work as instructed by your physician. ° °Special Instruction:  If any seeds are found, use tweezers to pick up seeds and place in a glass container of any kind and bring to your physician's office. ° °Call your physician if any of these symptoms occur: ° °· Persistent or heavy bleeding °· Urine stream diminishes or stops completely after catheter is removed °· Fever equal to or greater than 101 degrees F °· Cloudy urine with a strong foul odor °· Severe pain ° °You may feel some burning pain and/or hesitancy when you urinate after the catheter is removed.  These symptoms may increase over the next few weeks, but should diminish within forur to six weeks.  Applying moist heat to the lower abdomen or a hot tub bath may help relieve the pain.  If the discomfort becomes severe, please call your physician for additional medications. ° ° ° ° ° °Post Anesthesia Home Care Instructions ° °Activity: °Get plenty of rest for the remainder of the day. A responsible adult should stay with you for 24 hours following the procedure.  °For the next 24 hours, DO NOT: °-Drive a car °-Operate machinery °-Drink alcoholic beverages °-Take any medication unless instructed by your physician °-Make any legal decisions or sign important papers. ° °Meals: °Start with liquid foods such as gelatin or soup. Progress to regular foods as tolerated. Avoid greasy, spicy, heavy foods. If nausea  and/or vomiting occur, drink only clear liquids until the nausea and/or vomiting subsides. Call your physician if vomiting continues. ° °Special Instructions/Symptoms: °Your throat may feel dry or sore from the anesthesia or the breathing tube placed in your throat during surgery. If this causes discomfort, gargle with warm salt water. The discomfort should disappear within 24 hours. ° °

## 2023-01-04 ENCOUNTER — Encounter (HOSPITAL_BASED_OUTPATIENT_CLINIC_OR_DEPARTMENT_OTHER): Payer: Self-pay | Admitting: Urology

## 2023-01-04 NOTE — Progress Notes (Signed)
Transportation request placed for patient's upcoming CT Simulation followed by 5 weeks of daily radiation.

## 2023-01-17 ENCOUNTER — Telehealth: Payer: Self-pay | Admitting: *Deleted

## 2023-01-17 NOTE — Progress Notes (Signed)
RN placed additional request/follow up regarding barrier with transportation.

## 2023-01-17 NOTE — Telephone Encounter (Signed)
CALLED PATIENT'S DAUGHTER- TARA Jay AND REMINDED HER OF HER'S DAD'S APPT. FOR 01-18-23- ARRIVAL TIME- 2:45 PM ,@ [facility], LVM FOR A RETURN CALL

## 2023-01-18 ENCOUNTER — Ambulatory Visit: Payer: Commercial Managed Care - HMO | Admitting: Radiation Oncology

## 2023-01-18 ENCOUNTER — Other Ambulatory Visit: Payer: Self-pay

## 2023-01-18 ENCOUNTER — Ambulatory Visit
Admission: RE | Admit: 2023-01-18 | Discharge: 2023-01-18 | Disposition: A | Discharge status: Home / Self Care | Payer: Commercial Managed Care - HMO | Source: Ambulatory Visit | Attending: Radiation Oncology | Admitting: Radiation Oncology

## 2023-01-18 DIAGNOSIS — Z510 Encounter for antineoplastic radiation therapy: Secondary | ICD-10-CM | POA: Insufficient documentation

## 2023-01-18 DIAGNOSIS — C61 Malignant neoplasm of prostate: Secondary | ICD-10-CM | POA: Insufficient documentation

## 2023-01-23 NOTE — Progress Notes (Signed)
  Radiation Oncology         [phone] ________________________________  Name: [name] [name] MRN: [id number]  Date: 01/18/2023  DOB: 05/06/1966  SIMULATION AND TREATMENT PLANNING NOTE    ICD-10-CM   1. Malignant neoplasm of prostate (HCC)  C61       DIAGNOSIS:  57 y.o. gentleman with Stage T1c adenocarcinoma of the prostate with Gleason score of 4+5, and PSA of 29.4.   NARRATIVE:  The patient was brought to the CT Simulation planning suite.  Identity was confirmed.  All relevant records and images related to the planned course of therapy were reviewed.  The patient freely provided informed written consent to proceed with treatment after reviewing the details related to the planned course of therapy. The consent form was witnessed and verified by the simulation staff.  Then, the patient was set-up in a stable reproducible supine position for radiation therapy.  A vacuum lock pillow device was custom fabricated to position his legs in a reproducible immobilized position.  Then, I performed a urethrogram under sterile conditions to identify the prostatic apex.  CT images were obtained.  Surface markings were placed.  The CT images were loaded into the planning software.  Then the prostate target and avoidance structures including the rectum, bladder, bowel and hips were contoured.  Treatment planning then occurred.  The radiation prescription was entered and confirmed.  A total of one complex treatment devices were fabricated. I have requested : Intensity Modulated Radiotherapy (IMRT) is medically necessary for this case for the following reason:  Rectal sparing.Marland Kitchen  PLAN:  The patient will receive 45 Gy in 25 fractions of 1.8 Gy, to supplement an up-front prostate seed implant boost of 110 Gy to achieve a total nominal dose of 155 Gy.  ________________________________  Artist Pais Kathrynn Running, M.D.

## 2023-01-23 NOTE — Progress Notes (Signed)
  Radiation Oncology         [phone] ________________________________  Name: [name] [name] MRN: [id number]  Date: 01/18/2023  DOB: [birth year]  COMPLEX SIMULATION NOTE  NARRATIVE:  The patient was brought to the CT Simulation planning suite today following prostate seed implantation approximately one month ago.  Identity was confirmed.  All relevant records and images related to the planned course of therapy were reviewed.  Then, the patient was set-up supine.  CT images were obtained.  The CT images were loaded into the planning software.  Then the prostate and rectum were contoured.  Treatment planning then occurred.  The implanted iodine 125 seeds were identified by the physics staff for projection of radiation distribution  I have requested : 3D Simulation  I have requested a DVH of the following structures: Prostate and rectum.    ________________________________  Artist Pais Kathrynn Running, M.D.

## 2023-01-24 ENCOUNTER — Encounter: Payer: Self-pay | Admitting: Radiation Oncology

## 2023-01-24 DIAGNOSIS — Z510 Encounter for antineoplastic radiation therapy: Secondary | ICD-10-CM | POA: Diagnosis not present

## 2023-01-24 NOTE — Progress Notes (Signed)
  Radiation Oncology         [phone] ________________________________  Name: [name] [name] MRN: [id number]  Date: 01/24/2023  DOB: 09-30-1965  3D Planning Note   Prostate Brachytherapy Post-Implant Dosimetry  Diagnosis: 57 y.o. gentleman with Stage T1c adenocarcinoma of the prostate with Gleason score of 4+5, and PSA of 29.4.   Narrative: On a previous date, [name] [name] returned following prostate seed implantation for post implant planning. He underwent CT scan complex simulation to delineate the three-dimensional structures of the pelvis and demonstrate the radiation distribution.  Since that time, the seed localization, and complex isodose planning with dose volume histograms have now been completed.  Results:   Prostate Coverage - The dose of radiation delivered to the 90% or more of the prostate gland (D90) was 120.11% of the prescription dose. This exceeds our goal of greater than 90%. Rectal Sparing - The volume of rectal tissue receiving the prescription dose or higher was 0.0 cc. This falls under our thresholds tolerance of 1.0 cc.  Impression: The prostate seed implant appears to show adequate target coverage and appropriate rectal sparing.  Plan:  The patient will continue to follow with urology for ongoing PSA determinations. I would anticipate a high likelihood for local tumor control with minimal risk for rectal morbidity.  ________________________________  Artist Pais Kathrynn Running, M.D.

## 2023-01-25 DIAGNOSIS — C61 Malignant neoplasm of prostate: Secondary | ICD-10-CM | POA: Diagnosis present

## 2023-01-25 DIAGNOSIS — Z510 Encounter for antineoplastic radiation therapy: Secondary | ICD-10-CM | POA: Diagnosis not present

## 2023-01-27 ENCOUNTER — Ambulatory Visit
Admission: RE | Admit: 2023-01-27 | Discharge: 2023-01-27 | Disposition: A | Discharge status: Home / Self Care | Payer: Commercial Managed Care - HMO | Source: Ambulatory Visit | Attending: Radiation Oncology | Admitting: Radiation Oncology

## 2023-01-27 ENCOUNTER — Inpatient Hospital Stay: Payer: Commercial Managed Care - HMO | Attending: Radiation Oncology

## 2023-01-27 ENCOUNTER — Telehealth: Payer: Self-pay | Admitting: Radiation Oncology

## 2023-01-27 ENCOUNTER — Other Ambulatory Visit: Payer: Self-pay

## 2023-01-27 DIAGNOSIS — Z510 Encounter for antineoplastic radiation therapy: Secondary | ICD-10-CM | POA: Diagnosis not present

## 2023-01-27 LAB — RAD ONC ARIA SESSION SUMMARY
Course Elapsed Days: 0
Plan Fractions Treated to Date: 1
Plan Prescribed Dose Per Fraction: 1.8 Gy
Plan Total Fractions Prescribed: 25
Plan Total Prescribed Dose: 45 Gy
Reference Point Dosage Given to Date: 1.8 Gy
Reference Point Session Dosage Given: 1.8 Gy
Session Number: 1

## 2023-01-27 NOTE — Telephone Encounter (Signed)
Pt called to advise transportation has still not arrived. Pt concerned about missing first treatment appt. Pt was advised that staff would be notified of delay and message would be sent to transport coordinator to contact pt. Pt verbalized understanding.

## 2023-01-28 ENCOUNTER — Ambulatory Visit
Admission: RE | Admit: 2023-01-28 | Discharge: 2023-01-28 | Disposition: A | Discharge status: Home / Self Care | Payer: Commercial Managed Care - HMO | Source: Ambulatory Visit | Attending: Radiation Oncology | Admitting: Radiation Oncology

## 2023-01-28 ENCOUNTER — Inpatient Hospital Stay: Payer: Commercial Managed Care - HMO

## 2023-01-28 ENCOUNTER — Other Ambulatory Visit: Payer: Self-pay

## 2023-01-28 ENCOUNTER — Ambulatory Visit: Admission: RE | Admit: 2023-01-28 | Payer: Commercial Managed Care - HMO | Source: Ambulatory Visit

## 2023-01-28 DIAGNOSIS — Z510 Encounter for antineoplastic radiation therapy: Secondary | ICD-10-CM | POA: Diagnosis not present

## 2023-01-28 LAB — RAD ONC ARIA SESSION SUMMARY
Course Elapsed Days: 1
Plan Fractions Treated to Date: 2
Plan Prescribed Dose Per Fraction: 1.8 Gy
Plan Total Fractions Prescribed: 25
Plan Total Prescribed Dose: 45 Gy
Reference Point Dosage Given to Date: 3.6 Gy
Reference Point Session Dosage Given: 1.8 Gy
Session Number: 2

## 2023-01-31 ENCOUNTER — Ambulatory Visit
Admission: RE | Admit: 2023-01-31 | Discharge: 2023-01-31 | Disposition: A | Discharge status: Home / Self Care | Payer: Commercial Managed Care - HMO | Source: Ambulatory Visit | Attending: Radiation Oncology | Admitting: Radiation Oncology

## 2023-01-31 ENCOUNTER — Other Ambulatory Visit: Payer: Self-pay

## 2023-01-31 DIAGNOSIS — Z510 Encounter for antineoplastic radiation therapy: Secondary | ICD-10-CM | POA: Diagnosis not present

## 2023-01-31 LAB — RAD ONC ARIA SESSION SUMMARY
Course Elapsed Days: 4
Plan Fractions Treated to Date: 3
Plan Prescribed Dose Per Fraction: 1.8 Gy
Plan Total Fractions Prescribed: 25
Plan Total Prescribed Dose: 45 Gy
Reference Point Dosage Given to Date: 5.4 Gy
Reference Point Session Dosage Given: 1.8 Gy
Session Number: 3

## 2023-02-01 ENCOUNTER — Inpatient Hospital Stay: Payer: Commercial Managed Care - HMO

## 2023-02-01 ENCOUNTER — Ambulatory Visit
Admission: RE | Admit: 2023-02-01 | Discharge: 2023-02-01 | Disposition: A | Discharge status: Home / Self Care | Payer: Commercial Managed Care - HMO | Source: Ambulatory Visit | Attending: Radiation Oncology | Admitting: Radiation Oncology

## 2023-02-01 ENCOUNTER — Other Ambulatory Visit: Payer: Self-pay

## 2023-02-01 DIAGNOSIS — Z510 Encounter for antineoplastic radiation therapy: Secondary | ICD-10-CM | POA: Diagnosis not present

## 2023-02-01 LAB — RAD ONC ARIA SESSION SUMMARY
Course Elapsed Days: 5
Plan Fractions Treated to Date: 4
Plan Prescribed Dose Per Fraction: 1.8 Gy
Plan Total Fractions Prescribed: 25
Plan Total Prescribed Dose: 45 Gy
Reference Point Dosage Given to Date: 7.2 Gy
Reference Point Session Dosage Given: 1.8 Gy
Session Number: 4

## 2023-02-02 ENCOUNTER — Ambulatory Visit (INDEPENDENT_AMBULATORY_CARE_PROVIDER_SITE_OTHER): Payer: Commercial Managed Care - HMO | Admitting: Urology

## 2023-02-02 ENCOUNTER — Inpatient Hospital Stay: Payer: Commercial Managed Care - HMO

## 2023-02-02 ENCOUNTER — Ambulatory Visit
Admission: RE | Admit: 2023-02-02 | Discharge: 2023-02-02 | Disposition: A | Discharge status: Home / Self Care | Payer: Commercial Managed Care - HMO | Source: Ambulatory Visit | Attending: Radiation Oncology | Admitting: Radiation Oncology

## 2023-02-02 ENCOUNTER — Other Ambulatory Visit: Payer: Self-pay

## 2023-02-02 ENCOUNTER — Encounter: Payer: Self-pay | Admitting: Urology

## 2023-02-02 VITALS — BP 107/67 | HR 54 | Ht 64.0 in | Wt 120.0 lb

## 2023-02-02 DIAGNOSIS — Z510 Encounter for antineoplastic radiation therapy: Secondary | ICD-10-CM | POA: Diagnosis not present

## 2023-02-02 DIAGNOSIS — Z79818 Long term (current) use of other agents affecting estrogen receptors and estrogen levels: Secondary | ICD-10-CM

## 2023-02-02 DIAGNOSIS — C61 Malignant neoplasm of prostate: Secondary | ICD-10-CM

## 2023-02-02 DIAGNOSIS — R3129 Other microscopic hematuria: Secondary | ICD-10-CM

## 2023-02-02 LAB — RAD ONC ARIA SESSION SUMMARY
Course Elapsed Days: 6
Plan Fractions Treated to Date: 5
Plan Prescribed Dose Per Fraction: 1.8 Gy
Plan Total Fractions Prescribed: 25
Plan Total Prescribed Dose: 45 Gy
Reference Point Dosage Given to Date: 9 Gy
Reference Point Session Dosage Given: 1.8 Gy
Session Number: 5

## 2023-02-02 NOTE — Progress Notes (Signed)
Assessment: 1. Prostate cancer; PSA 29.4; GG 1,4,5; PSMA PET negative; high risk disease   2. Androgen deprivation therapy   3. Microscopic hematuria       Plan: Continue ADT - next Eligard due after 03/22/23.  Will DC tamsulosin- he never started  Chief Complaint: Prostate cancer  HPI: [name] [name] is a 57 y.o. male who presents for continued evaluation of high risk prostate cancer. Patient has elected combination therapy with ADT plus IMRT plus brachii boost.  He recently had his brachytherapy and presents here today for follow-up.  He has already started IMRT. Patient states that he is doing very well clinically.  He reports mild stable lower urinary tract symptoms. PVR today = 19 mL. Patient reports that he never started taking the tamsulosin that was prescribed.  He does not feel like he needs it at this point.   Portions of the above documentation were copied from a prior visit for review purposes only.  Allergies: Allergies  Allergen Reactions   Aspirin Other (See Comments)    Aggravates ulcers and "causes chest pain"   Other    Pepperoni [Pickled Meat] Other (See Comments)    Aggravates ulcers   Tomato Other (See Comments)    Foods with tomato sauce aggravate ulcers    PMH: Past Medical History:  Diagnosis Date   Abnormal ECG    a. early repolarization   Arthritis    "my whole left side" (05/03/2014)   Bipolar disorder (HCC)    Bradycardia    a. asymptomatic   CAD in native artery    a. Nonobstructive cath 11/2007;  b. Presented with ST elevation - Nonobstructive cath 08/2011   Cancer [facility])    Prostate   Chest pain, mid sternal    Coronary artery disease    GERD (gastroesophageal reflux disease)    History of cocaine abuse (HCC)    a. quit ? 2009   History of ETOH abuse    a. drinks 2 "40's" / wk   Marijuana abuse    a. uses ~ 1x /wk or less   Pneumonia    Stomach ulcer    Syncope    a. 12/2010 - presumed to be vasovagal   Tobacco abuse      PSH: Past Surgical History:  Procedure Laterality Date   CARDIAC CATHETERIZATION  2009; 08/2011   Hattie Perch 08/06/2011   CYSTOSCOPY N/A 01/03/2023   Procedure: Bluford Kaufmann;  Surgeon: Joline Maxcy, MD;  Location: [facility];  Service: Urology;  Laterality: N/A;   LEFT HEART CATHETERIZATION WITH CORONARY ANGIOGRAM N/A 08/16/2011   Procedure: LEFT HEART CATHETERIZATION WITH CORONARY ANGIOGRAM;  Surgeon: Corky Crafts, MD;  Location: [facility] CATH LAB;  Service: Cardiovascular;  Laterality: N/A;   RADIOACTIVE SEED IMPLANT N/A 01/03/2023   Procedure: RADIOACTIVE SEED IMPLANT/BRACHYTHERAPY IMPLANT;  Surgeon: Joline Maxcy, MD;  Location: [facility];  Service: Urology;  Laterality: N/A;  90 MINUTES NEEDED FOR CASE   SPACE OAR INSTILLATION N/A 01/03/2023   Procedure: SPACE OAR INSTILLATION;  Surgeon: Joline Maxcy, MD;  Location[facility];  Service: Urology;  Laterality: N/A;    SH: Social History   Tobacco Use   Smoking status: Some Days    Current packs/day: 0.00    Average packs/day: 0.3 packs/day for 2.0 years (0.5 ttl pk-yrs)    Types: Cigarettes    Start date: 06/23/2013    Last attempt to quit: 06/24/2015    Years since  quitting: 7.6   Smokeless tobacco: Never  Vaping Use   Vaping status: Never Used  Substance Use Topics   Alcohol use: Not Currently    Alcohol/week: 48.0 standard drinks of alcohol    Types: 48 Cans of beer per week   Drug use: Yes    Types: Marijuana    Comment: Crack one year ago and marijuana was yesterday    ROS: Constitutional:  Negative for fever, chills, weight loss CV: Negative for chest pain, previous MI, hypertension Respiratory:  Negative for shortness of breath, wheezing, sleep apnea, frequent cough GI:  Negative for nausea, vomiting, bloody stool, GERD  PE: There were no vitals taken for this visit. GENERAL APPEARANCE:  Well appearing, well developed, well nourished, NAD

## 2023-02-03 ENCOUNTER — Other Ambulatory Visit: Payer: Self-pay

## 2023-02-03 ENCOUNTER — Ambulatory Visit
Admission: RE | Admit: 2023-02-03 | Discharge: 2023-02-03 | Disposition: A | Discharge status: Home / Self Care | Payer: Commercial Managed Care - HMO | Source: Ambulatory Visit | Attending: Radiation Oncology | Admitting: Radiation Oncology

## 2023-02-03 DIAGNOSIS — Z510 Encounter for antineoplastic radiation therapy: Secondary | ICD-10-CM | POA: Diagnosis not present

## 2023-02-03 LAB — RAD ONC ARIA SESSION SUMMARY
Course Elapsed Days: 7
Plan Fractions Treated to Date: 6
Plan Prescribed Dose Per Fraction: 1.8 Gy
Plan Total Fractions Prescribed: 25
Plan Total Prescribed Dose: 45 Gy
Reference Point Dosage Given to Date: 10.8 Gy
Reference Point Session Dosage Given: 1.8 Gy
Session Number: 6

## 2023-02-04 ENCOUNTER — Other Ambulatory Visit: Payer: Self-pay

## 2023-02-04 ENCOUNTER — Ambulatory Visit: Payer: Commercial Managed Care - HMO

## 2023-02-04 ENCOUNTER — Ambulatory Visit: Admission: RE | Admit: 2023-02-04 | Payer: Commercial Managed Care - HMO | Source: Ambulatory Visit

## 2023-02-04 DIAGNOSIS — Z510 Encounter for antineoplastic radiation therapy: Secondary | ICD-10-CM | POA: Diagnosis not present

## 2023-02-04 LAB — RAD ONC ARIA SESSION SUMMARY
Course Elapsed Days: 8
Plan Fractions Treated to Date: 7
Plan Prescribed Dose Per Fraction: 1.8 Gy
Plan Total Fractions Prescribed: 25
Plan Total Prescribed Dose: 45 Gy
Reference Point Dosage Given to Date: 12.6 Gy
Reference Point Session Dosage Given: 1.8 Gy
Session Number: 7

## 2023-02-08 ENCOUNTER — Other Ambulatory Visit: Payer: Self-pay

## 2023-02-08 ENCOUNTER — Ambulatory Visit
Admission: RE | Admit: 2023-02-08 | Discharge: 2023-02-08 | Disposition: A | Discharge status: Home / Self Care | Payer: Commercial Managed Care - HMO | Source: Ambulatory Visit | Attending: Radiation Oncology | Admitting: Radiation Oncology

## 2023-02-08 DIAGNOSIS — Z510 Encounter for antineoplastic radiation therapy: Secondary | ICD-10-CM | POA: Diagnosis not present

## 2023-02-08 DIAGNOSIS — C61 Malignant neoplasm of prostate: Secondary | ICD-10-CM | POA: Diagnosis present

## 2023-02-08 LAB — RAD ONC ARIA SESSION SUMMARY
Course Elapsed Days: 12
Plan Fractions Treated to Date: 8
Plan Prescribed Dose Per Fraction: 1.8 Gy
Plan Total Fractions Prescribed: 25
Plan Total Prescribed Dose: 45 Gy
Reference Point Dosage Given to Date: 14.4 Gy
Reference Point Session Dosage Given: 1.8 Gy
Session Number: 8

## 2023-02-09 ENCOUNTER — Inpatient Hospital Stay: Payer: Commercial Managed Care - HMO | Attending: Radiation Oncology

## 2023-02-09 ENCOUNTER — Other Ambulatory Visit: Payer: Self-pay

## 2023-02-09 ENCOUNTER — Ambulatory Visit
Admission: RE | Admit: 2023-02-09 | Discharge: 2023-02-09 | Disposition: A | Discharge status: Home / Self Care | Payer: Commercial Managed Care - HMO | Source: Ambulatory Visit | Attending: Radiation Oncology | Admitting: Radiation Oncology

## 2023-02-09 DIAGNOSIS — Z510 Encounter for antineoplastic radiation therapy: Secondary | ICD-10-CM | POA: Diagnosis not present

## 2023-02-09 LAB — RAD ONC ARIA SESSION SUMMARY
Course Elapsed Days: 13
Plan Fractions Treated to Date: 9
Plan Prescribed Dose Per Fraction: 1.8 Gy
Plan Total Fractions Prescribed: 25
Plan Total Prescribed Dose: 45 Gy
Reference Point Dosage Given to Date: 16.2 Gy
Reference Point Session Dosage Given: 1.8 Gy
Session Number: 9

## 2023-02-10 ENCOUNTER — Other Ambulatory Visit: Payer: Self-pay

## 2023-02-10 ENCOUNTER — Ambulatory Visit
Admission: RE | Admit: 2023-02-10 | Discharge: 2023-02-10 | Disposition: A | Discharge status: Home / Self Care | Payer: Commercial Managed Care - HMO | Source: Ambulatory Visit | Attending: Radiation Oncology | Admitting: Radiation Oncology

## 2023-02-10 ENCOUNTER — Inpatient Hospital Stay: Payer: Commercial Managed Care - HMO

## 2023-02-10 DIAGNOSIS — Z510 Encounter for antineoplastic radiation therapy: Secondary | ICD-10-CM | POA: Diagnosis not present

## 2023-02-10 LAB — RAD ONC ARIA SESSION SUMMARY
Course Elapsed Days: 14
Plan Fractions Treated to Date: 10
Plan Prescribed Dose Per Fraction: 1.8 Gy
Plan Total Fractions Prescribed: 25
Plan Total Prescribed Dose: 45 Gy
Reference Point Dosage Given to Date: 18 Gy
Reference Point Session Dosage Given: 1.8 Gy
Session Number: 10

## 2023-02-11 ENCOUNTER — Ambulatory Visit
Admission: RE | Admit: 2023-02-11 | Discharge: 2023-02-11 | Disposition: A | Discharge status: Home / Self Care | Payer: Commercial Managed Care - HMO | Source: Ambulatory Visit | Attending: Radiation Oncology

## 2023-02-11 ENCOUNTER — Other Ambulatory Visit: Payer: Self-pay

## 2023-02-11 ENCOUNTER — Inpatient Hospital Stay: Payer: Commercial Managed Care - HMO

## 2023-02-11 DIAGNOSIS — Z510 Encounter for antineoplastic radiation therapy: Secondary | ICD-10-CM | POA: Diagnosis not present

## 2023-02-11 LAB — URINALYSIS, ROUTINE W REFLEX MICROSCOPIC
Bilirubin, UA: NEGATIVE
Glucose, UA: NEGATIVE
Ketones, UA: NEGATIVE
Nitrite, UA: NEGATIVE
Protein,UA: NEGATIVE
RBC, UA: NEGATIVE
Specific Gravity, UA: 1.02 (ref 1.005–1.030)
Urobilinogen, Ur: 0.2 mg/dL (ref 0.2–1.0)
pH, UA: 5.5 (ref 5.0–7.5)

## 2023-02-11 LAB — RAD ONC ARIA SESSION SUMMARY
Course Elapsed Days: 15
Plan Fractions Treated to Date: 11
Plan Prescribed Dose Per Fraction: 1.8 Gy
Plan Total Fractions Prescribed: 25
Plan Total Prescribed Dose: 45 Gy
Reference Point Dosage Given to Date: 19.8 Gy
Reference Point Session Dosage Given: 1.8 Gy
Session Number: 11

## 2023-02-11 LAB — MICROSCOPIC EXAMINATION

## 2023-02-14 ENCOUNTER — Other Ambulatory Visit: Payer: Self-pay

## 2023-02-14 ENCOUNTER — Inpatient Hospital Stay: Payer: Commercial Managed Care - HMO

## 2023-02-14 ENCOUNTER — Ambulatory Visit
Admission: RE | Admit: 2023-02-14 | Discharge: 2023-02-14 | Disposition: A | Discharge status: Home / Self Care | Payer: Commercial Managed Care - HMO | Source: Ambulatory Visit | Attending: Radiation Oncology | Admitting: Radiation Oncology

## 2023-02-14 DIAGNOSIS — Z510 Encounter for antineoplastic radiation therapy: Secondary | ICD-10-CM | POA: Diagnosis not present

## 2023-02-14 LAB — RAD ONC ARIA SESSION SUMMARY
Course Elapsed Days: 18
Plan Fractions Treated to Date: 12
Plan Prescribed Dose Per Fraction: 1.8 Gy
Plan Total Fractions Prescribed: 25
Plan Total Prescribed Dose: 45 Gy
Reference Point Dosage Given to Date: 21.6 Gy
Reference Point Session Dosage Given: 1.8 Gy
Session Number: 12

## 2023-02-15 ENCOUNTER — Other Ambulatory Visit: Payer: Self-pay

## 2023-02-15 ENCOUNTER — Ambulatory Visit
Admission: RE | Admit: 2023-02-15 | Discharge: 2023-02-15 | Disposition: A | Discharge status: Home / Self Care | Payer: Commercial Managed Care - HMO | Source: Ambulatory Visit | Attending: Radiation Oncology

## 2023-02-15 DIAGNOSIS — Z510 Encounter for antineoplastic radiation therapy: Secondary | ICD-10-CM | POA: Diagnosis not present

## 2023-02-15 LAB — RAD ONC ARIA SESSION SUMMARY
Course Elapsed Days: 19
Plan Fractions Treated to Date: 13
Plan Prescribed Dose Per Fraction: 1.8 Gy
Plan Total Fractions Prescribed: 25
Plan Total Prescribed Dose: 45 Gy
Reference Point Dosage Given to Date: 23.4 Gy
Reference Point Session Dosage Given: 1.8 Gy
Session Number: 13

## 2023-02-16 ENCOUNTER — Other Ambulatory Visit: Payer: Self-pay

## 2023-02-16 ENCOUNTER — Inpatient Hospital Stay: Payer: Commercial Managed Care - HMO

## 2023-02-16 ENCOUNTER — Ambulatory Visit
Admission: RE | Admit: 2023-02-16 | Discharge: 2023-02-16 | Disposition: A | Discharge status: Home / Self Care | Payer: Commercial Managed Care - HMO | Source: Ambulatory Visit | Attending: Radiation Oncology

## 2023-02-16 DIAGNOSIS — Z510 Encounter for antineoplastic radiation therapy: Secondary | ICD-10-CM | POA: Diagnosis not present

## 2023-02-16 LAB — RAD ONC ARIA SESSION SUMMARY
Course Elapsed Days: 20
Plan Fractions Treated to Date: 14
Plan Prescribed Dose Per Fraction: 1.8 Gy
Plan Total Fractions Prescribed: 25
Plan Total Prescribed Dose: 45 Gy
Reference Point Dosage Given to Date: 25.2 Gy
Reference Point Session Dosage Given: 1.8 Gy
Session Number: 14

## 2023-02-17 ENCOUNTER — Ambulatory Visit
Admission: RE | Admit: 2023-02-17 | Discharge: 2023-02-17 | Disposition: A | Discharge status: Home / Self Care | Payer: Commercial Managed Care - HMO | Source: Ambulatory Visit | Attending: Radiation Oncology | Admitting: Radiation Oncology

## 2023-02-17 ENCOUNTER — Other Ambulatory Visit: Payer: Self-pay

## 2023-02-17 ENCOUNTER — Inpatient Hospital Stay: Payer: Commercial Managed Care - HMO

## 2023-02-17 DIAGNOSIS — Z510 Encounter for antineoplastic radiation therapy: Secondary | ICD-10-CM | POA: Diagnosis not present

## 2023-02-17 LAB — RAD ONC ARIA SESSION SUMMARY
Course Elapsed Days: 21
Plan Fractions Treated to Date: 15
Plan Prescribed Dose Per Fraction: 1.8 Gy
Plan Total Fractions Prescribed: 25
Plan Total Prescribed Dose: 45 Gy
Reference Point Dosage Given to Date: 27 Gy
Reference Point Session Dosage Given: 1.8 Gy
Session Number: 15

## 2023-02-18 ENCOUNTER — Ambulatory Visit: Payer: Commercial Managed Care - HMO

## 2023-02-18 ENCOUNTER — Ambulatory Visit
Admission: RE | Admit: 2023-02-18 | Discharge: 2023-02-18 | Disposition: A | Discharge status: Home / Self Care | Payer: Commercial Managed Care - HMO | Source: Ambulatory Visit | Attending: Radiation Oncology | Admitting: Radiation Oncology

## 2023-02-18 ENCOUNTER — Inpatient Hospital Stay: Payer: Commercial Managed Care - HMO

## 2023-02-18 ENCOUNTER — Other Ambulatory Visit: Payer: Self-pay

## 2023-02-18 DIAGNOSIS — Z510 Encounter for antineoplastic radiation therapy: Secondary | ICD-10-CM | POA: Diagnosis not present

## 2023-02-18 LAB — RAD ONC ARIA SESSION SUMMARY
Course Elapsed Days: 22
Plan Fractions Treated to Date: 16
Plan Prescribed Dose Per Fraction: 1.8 Gy
Plan Total Fractions Prescribed: 25
Plan Total Prescribed Dose: 45 Gy
Reference Point Dosage Given to Date: 28.8 Gy
Reference Point Session Dosage Given: 1.8 Gy
Session Number: 16

## 2023-02-21 ENCOUNTER — Ambulatory Visit
Admission: RE | Admit: 2023-02-21 | Discharge: 2023-02-21 | Disposition: A | Discharge status: Home / Self Care | Payer: Commercial Managed Care - HMO | Source: Ambulatory Visit | Attending: Radiation Oncology | Admitting: Radiation Oncology

## 2023-02-21 ENCOUNTER — Other Ambulatory Visit: Payer: Self-pay

## 2023-02-21 DIAGNOSIS — Z510 Encounter for antineoplastic radiation therapy: Secondary | ICD-10-CM | POA: Diagnosis not present

## 2023-02-21 LAB — RAD ONC ARIA SESSION SUMMARY
Course Elapsed Days: 25
Plan Fractions Treated to Date: 17
Plan Prescribed Dose Per Fraction: 1.8 Gy
Plan Total Fractions Prescribed: 25
Plan Total Prescribed Dose: 45 Gy
Reference Point Dosage Given to Date: 30.6 Gy
Reference Point Session Dosage Given: 1.8 Gy
Session Number: 17

## 2023-02-22 ENCOUNTER — Other Ambulatory Visit: Payer: Self-pay

## 2023-02-22 ENCOUNTER — Ambulatory Visit
Admission: RE | Admit: 2023-02-22 | Discharge: 2023-02-22 | Disposition: A | Discharge status: Home / Self Care | Payer: Commercial Managed Care - HMO | Source: Ambulatory Visit | Attending: Radiation Oncology

## 2023-02-22 DIAGNOSIS — Z510 Encounter for antineoplastic radiation therapy: Secondary | ICD-10-CM | POA: Diagnosis not present

## 2023-02-22 LAB — RAD ONC ARIA SESSION SUMMARY
Course Elapsed Days: 26
Plan Fractions Treated to Date: 18
Plan Prescribed Dose Per Fraction: 1.8 Gy
Plan Total Fractions Prescribed: 25
Plan Total Prescribed Dose: 45 Gy
Reference Point Dosage Given to Date: 32.4 Gy
Reference Point Session Dosage Given: 1.8 Gy
Session Number: 18

## 2023-02-23 ENCOUNTER — Other Ambulatory Visit: Payer: Self-pay

## 2023-02-23 ENCOUNTER — Ambulatory Visit
Admission: RE | Admit: 2023-02-23 | Discharge: 2023-02-23 | Disposition: A | Discharge status: Home / Self Care | Payer: Commercial Managed Care - HMO | Source: Ambulatory Visit | Attending: Radiation Oncology

## 2023-02-23 ENCOUNTER — Inpatient Hospital Stay: Payer: Commercial Managed Care - HMO

## 2023-02-23 DIAGNOSIS — Z510 Encounter for antineoplastic radiation therapy: Secondary | ICD-10-CM | POA: Diagnosis not present

## 2023-02-23 LAB — RAD ONC ARIA SESSION SUMMARY
Course Elapsed Days: 27
Plan Fractions Treated to Date: 19
Plan Prescribed Dose Per Fraction: 1.8 Gy
Plan Total Fractions Prescribed: 25
Plan Total Prescribed Dose: 45 Gy
Reference Point Dosage Given to Date: 34.2 Gy
Reference Point Session Dosage Given: 1.8 Gy
Session Number: 19

## 2023-02-24 ENCOUNTER — Inpatient Hospital Stay: Payer: Commercial Managed Care - HMO

## 2023-02-24 ENCOUNTER — Ambulatory Visit
Admission: RE | Admit: 2023-02-24 | Discharge: 2023-02-24 | Disposition: A | Discharge status: Home / Self Care | Payer: Commercial Managed Care - HMO | Source: Ambulatory Visit | Attending: Radiation Oncology | Admitting: Radiation Oncology

## 2023-02-24 ENCOUNTER — Other Ambulatory Visit: Payer: Self-pay

## 2023-02-24 DIAGNOSIS — Z510 Encounter for antineoplastic radiation therapy: Secondary | ICD-10-CM | POA: Diagnosis not present

## 2023-02-24 LAB — RAD ONC ARIA SESSION SUMMARY
Course Elapsed Days: 28
Plan Fractions Treated to Date: 20
Plan Prescribed Dose Per Fraction: 1.8 Gy
Plan Total Fractions Prescribed: 25
Plan Total Prescribed Dose: 45 Gy
Reference Point Dosage Given to Date: 36 Gy
Reference Point Session Dosage Given: 1.8 Gy
Session Number: 20

## 2023-02-25 ENCOUNTER — Other Ambulatory Visit: Payer: Self-pay

## 2023-02-25 ENCOUNTER — Ambulatory Visit
Admission: RE | Admit: 2023-02-25 | Discharge: 2023-02-25 | Disposition: A | Discharge status: Home / Self Care | Payer: Commercial Managed Care - HMO | Source: Ambulatory Visit | Attending: Radiation Oncology

## 2023-02-25 ENCOUNTER — Inpatient Hospital Stay: Payer: Commercial Managed Care - HMO

## 2023-02-25 DIAGNOSIS — Z510 Encounter for antineoplastic radiation therapy: Secondary | ICD-10-CM | POA: Diagnosis not present

## 2023-02-25 LAB — RAD ONC ARIA SESSION SUMMARY
Course Elapsed Days: 29
Plan Fractions Treated to Date: 21
Plan Prescribed Dose Per Fraction: 1.8 Gy
Plan Total Fractions Prescribed: 25
Plan Total Prescribed Dose: 45 Gy
Reference Point Dosage Given to Date: 37.8 Gy
Reference Point Session Dosage Given: 1.8 Gy
Session Number: 21

## 2023-02-28 ENCOUNTER — Other Ambulatory Visit: Payer: Self-pay

## 2023-02-28 ENCOUNTER — Ambulatory Visit
Admission: RE | Admit: 2023-02-28 | Discharge: 2023-02-28 | Disposition: A | Discharge status: Home / Self Care | Payer: Commercial Managed Care - HMO | Source: Ambulatory Visit | Attending: Radiation Oncology | Admitting: Radiation Oncology

## 2023-02-28 ENCOUNTER — Ambulatory Visit
Admission: RE | Admit: 2023-02-28 | Discharge: 2023-02-28 | Disposition: A | Discharge status: Home / Self Care | Payer: Commercial Managed Care - HMO | Source: Ambulatory Visit | Attending: Radiation Oncology

## 2023-02-28 ENCOUNTER — Inpatient Hospital Stay: Payer: Commercial Managed Care - HMO

## 2023-02-28 DIAGNOSIS — Z510 Encounter for antineoplastic radiation therapy: Secondary | ICD-10-CM | POA: Diagnosis not present

## 2023-02-28 LAB — RAD ONC ARIA SESSION SUMMARY
Course Elapsed Days: 32
Plan Fractions Treated to Date: 22
Plan Prescribed Dose Per Fraction: 1.8 Gy
Plan Total Fractions Prescribed: 25
Plan Total Prescribed Dose: 45 Gy
Reference Point Dosage Given to Date: 39.6 Gy
Reference Point Session Dosage Given: 1.8 Gy
Session Number: 22

## 2023-03-01 ENCOUNTER — Inpatient Hospital Stay: Payer: Commercial Managed Care - HMO

## 2023-03-01 ENCOUNTER — Ambulatory Visit
Admission: RE | Admit: 2023-03-01 | Discharge: 2023-03-01 | Disposition: A | Discharge status: Home / Self Care | Payer: Commercial Managed Care - HMO | Source: Ambulatory Visit | Attending: Radiation Oncology | Admitting: Radiation Oncology

## 2023-03-01 ENCOUNTER — Other Ambulatory Visit: Payer: Self-pay

## 2023-03-01 DIAGNOSIS — Z510 Encounter for antineoplastic radiation therapy: Secondary | ICD-10-CM | POA: Diagnosis not present

## 2023-03-01 LAB — RAD ONC ARIA SESSION SUMMARY
Course Elapsed Days: 33
Plan Fractions Treated to Date: 23
Plan Prescribed Dose Per Fraction: 1.8 Gy
Plan Total Fractions Prescribed: 25
Plan Total Prescribed Dose: 45 Gy
Reference Point Dosage Given to Date: 41.4 Gy
Reference Point Session Dosage Given: 1.8 Gy
Session Number: 23

## 2023-03-02 ENCOUNTER — Other Ambulatory Visit: Payer: Self-pay

## 2023-03-02 ENCOUNTER — Inpatient Hospital Stay: Payer: Commercial Managed Care - HMO

## 2023-03-02 ENCOUNTER — Ambulatory Visit
Admission: RE | Admit: 2023-03-02 | Discharge: 2023-03-02 | Disposition: A | Discharge status: Home / Self Care | Payer: Commercial Managed Care - HMO | Source: Ambulatory Visit | Attending: Radiation Oncology | Admitting: Radiation Oncology

## 2023-03-02 DIAGNOSIS — Z510 Encounter for antineoplastic radiation therapy: Secondary | ICD-10-CM | POA: Diagnosis not present

## 2023-03-02 LAB — RAD ONC ARIA SESSION SUMMARY
Course Elapsed Days: 34
Plan Fractions Treated to Date: 24
Plan Prescribed Dose Per Fraction: 1.8 Gy
Plan Total Fractions Prescribed: 25
Plan Total Prescribed Dose: 45 Gy
Reference Point Dosage Given to Date: 43.2 Gy
Reference Point Session Dosage Given: 1.8 Gy
Session Number: 24

## 2023-03-03 ENCOUNTER — Ambulatory Visit
Admission: RE | Admit: 2023-03-03 | Discharge: 2023-03-03 | Disposition: A | Discharge status: Home / Self Care | Payer: Commercial Managed Care - HMO | Source: Ambulatory Visit | Attending: Radiation Oncology | Admitting: Radiation Oncology

## 2023-03-03 ENCOUNTER — Inpatient Hospital Stay: Payer: Commercial Managed Care - HMO

## 2023-03-03 ENCOUNTER — Other Ambulatory Visit: Payer: Self-pay

## 2023-03-03 DIAGNOSIS — Z510 Encounter for antineoplastic radiation therapy: Secondary | ICD-10-CM | POA: Diagnosis not present

## 2023-03-03 LAB — RAD ONC ARIA SESSION SUMMARY
Course Elapsed Days: 35
Plan Fractions Treated to Date: 25
Plan Prescribed Dose Per Fraction: 1.8 Gy
Plan Total Fractions Prescribed: 25
Plan Total Prescribed Dose: 45 Gy
Reference Point Dosage Given to Date: 45 Gy
Reference Point Session Dosage Given: 1.8 Gy
Session Number: 25

## 2023-03-04 NOTE — Progress Notes (Signed)
Patient was a RadOnc Consult on 09/06/22 for his stage T1c adenocarcinoma of the prostate with Gleason score of 4+5, and PSA of 29.4. Marland Kitchen  Patient proceed with treatment recommendations of  ADT in combination with brachytherapy followed by 5 weeks of radiation therapy and had his final radiation treatment on 03/03/2023.   Patient will continue care under urology and has an upcoming appointment with Dr. Pete Glatter on 10/16 to continue ADT.  RN left message with patient to review post treatment education.

## 2023-03-04 NOTE — Radiation Completion Notes (Addendum)
  Radiation Oncology         [phone] ________________________________  Name: [name] [name] MRN: [id number]  Date: 03/03/2023  DOB: 26-Oct-1965  Referring Physician: ADINE MANLY, M.D. Date of Service: 2023-03-04 Radiation Oncologist: Adina Barge, M.D. [facility]     RADIATION ONCOLOGY END OF TREATMENT NOTE     Diagnosis: C61 Malignant neoplasm of prostate Staging on 2022-07-22: Malignant neoplasm of prostate (HCC) T=cT1c, N=cN0, M=cM0 Intent: Curative     ==========DELIVERED PLANS==========  First Treatment Date: 2023-01-27 - Last Treatment Date: 2023-03-03   Plan Name: Prostate_Pelv Site: Prostate Technique: IMRT Mode: Photon Dose Per Fraction: 1.8 Gy Prescribed Dose (Delivered / Prescribed): 45 Gy / 45 Gy Prescribed Fxs (Delivered / Prescribed): 25 / 25   Plan Name: Prostate Seed Implant Site: Prostate Technique: Radioactive Seed Implant I-125 Mode: Brachytherapy Dose Per Fraction: 110 Gy Prescribed Dose (Delivered / Prescribed): 110 Gy / 110 Gy Prescribed Fxs (Delivered / Prescribed): 1 / 1     ==========ON TREATMENT VISIT DATES========== 2023-01-28, 2023-02-08, 2023-02-11, 2023-02-21, 2023-02-28    See weekly On Treatment Notes in Epic for details.  The patient will receive a call in about one month from the radiation oncology department. He will continue follow up with his urologist, Dr. MANLY, as well.  ------------------------------------------------   Donnice Barge, MD [facility]  Radiation Oncology Direct Dial: [phone]  Fax: [phone] [email or web address]  Skype  LinkedIn

## 2023-03-16 ENCOUNTER — Other Ambulatory Visit: Payer: Self-pay

## 2023-03-16 ENCOUNTER — Emergency Department (HOSPITAL_COMMUNITY)
Admission: EM | Admit: 2023-03-16 | Discharge: 2023-03-17 | Discharge status: Against Medical Advice | Payer: Commercial Managed Care - HMO | Attending: Emergency Medicine | Admitting: Emergency Medicine

## 2023-03-16 ENCOUNTER — Encounter (HOSPITAL_COMMUNITY): Payer: Self-pay | Admitting: Emergency Medicine

## 2023-03-16 ENCOUNTER — Emergency Department (HOSPITAL_COMMUNITY): Payer: Commercial Managed Care - HMO

## 2023-03-16 DIAGNOSIS — M542 Cervicalgia: Secondary | ICD-10-CM | POA: Insufficient documentation

## 2023-03-16 DIAGNOSIS — R079 Chest pain, unspecified: Secondary | ICD-10-CM | POA: Insufficient documentation

## 2023-03-16 DIAGNOSIS — Z5321 Procedure and treatment not carried out due to patient leaving prior to being seen by health care provider: Secondary | ICD-10-CM | POA: Insufficient documentation

## 2023-03-16 LAB — TROPONIN I (HIGH SENSITIVITY): Troponin I (High Sensitivity): 2 ng/L (ref <18)

## 2023-03-16 LAB — CBC
HCT: 30.8 % — ABNORMAL LOW (ref 39.0–52.0)
Hemoglobin: 10.5 g/dL — ABNORMAL LOW (ref 13.0–17.0)
MCH: 30.5 pg (ref 26.0–34.0)
MCHC: 34.1 g/dL (ref 30.0–36.0)
MCV: 89.5 fL (ref 80.0–100.0)
Platelets: 224 10*3/uL (ref 150–400)
RBC: 3.44 MIL/uL — ABNORMAL LOW (ref 4.22–5.81)
RDW: 14.8 % (ref 11.5–15.5)
WBC: 3.1 10*3/uL — ABNORMAL LOW (ref 4.0–10.5)
nRBC: 0 % (ref 0.0–0.2)

## 2023-03-16 LAB — BASIC METABOLIC PANEL
Anion gap: 10 (ref 5–15)
BUN: 9 mg/dL (ref 6–20)
CO2: 26 mmol/L (ref 22–32)
Calcium: 9 mg/dL (ref 8.9–10.3)
Chloride: 105 mmol/L (ref 98–111)
Creatinine, Ser: 1.36 mg/dL — ABNORMAL HIGH (ref 0.61–1.24)
GFR, Estimated: >60 mL/min (ref >60)
Glucose, Bld: 111 mg/dL — ABNORMAL HIGH (ref 70–99)
Potassium: 3.8 mmol/L (ref 3.5–5.1)
Sodium: 141 mmol/L (ref 135–145)

## 2023-03-16 NOTE — ED Triage Notes (Signed)
Patient arrives ambulatory by POV c/o left sided chest pain onset of yesterday. Also having left sided neck pain. Reports cardiac hx but does not take blood thinner or any aspirin daily. Unsure of who cardiologist is since hasn't seen him in a while. Reports orthostatic hypotension over the past week.

## 2023-03-17 NOTE — ED Notes (Signed)
Tech called pt 3x for vitals no response

## 2023-03-17 NOTE — ED Notes (Signed)
Pt called for repeat trop, no response

## 2023-03-23 ENCOUNTER — Ambulatory Visit: Payer: Managed Care, Other (non HMO) | Admitting: Urology

## 2023-03-23 ENCOUNTER — Encounter: Payer: Self-pay | Admitting: Urology

## 2023-03-23 VITALS — BP 106/66 | HR 51 | Ht 64.0 in | Wt 121.0 lb

## 2023-03-23 DIAGNOSIS — N401 Enlarged prostate with lower urinary tract symptoms: Secondary | ICD-10-CM

## 2023-03-23 DIAGNOSIS — C61 Malignant neoplasm of prostate: Secondary | ICD-10-CM

## 2023-03-23 DIAGNOSIS — R351 Nocturia: Secondary | ICD-10-CM

## 2023-03-23 DIAGNOSIS — N400 Enlarged prostate without lower urinary tract symptoms: Secondary | ICD-10-CM

## 2023-03-23 DIAGNOSIS — R3915 Urgency of urination: Secondary | ICD-10-CM | POA: Diagnosis not present

## 2023-03-23 DIAGNOSIS — R3129 Other microscopic hematuria: Secondary | ICD-10-CM

## 2023-03-23 DIAGNOSIS — R399 Unspecified symptoms and signs involving the genitourinary system: Secondary | ICD-10-CM

## 2023-03-23 DIAGNOSIS — R3911 Hesitancy of micturition: Secondary | ICD-10-CM | POA: Diagnosis not present

## 2023-03-23 DIAGNOSIS — IMO0001 Reserved for inherently not codable concepts without codable children: Secondary | ICD-10-CM

## 2023-03-23 DIAGNOSIS — Z79818 Long term (current) use of other agents affecting estrogen receptors and estrogen levels: Secondary | ICD-10-CM

## 2023-03-23 LAB — URINALYSIS, ROUTINE W REFLEX MICROSCOPIC
Bilirubin, UA: NEGATIVE
Glucose, UA: NEGATIVE
Ketones, UA: NEGATIVE
Leukocytes,UA: NEGATIVE
Nitrite, UA: NEGATIVE
Protein,UA: NEGATIVE
RBC, UA: NEGATIVE
Specific Gravity, UA: 1.01 (ref 1.005–1.030)
Urobilinogen, Ur: 0.2 mg/dL (ref 0.2–1.0)
pH, UA: 5.5 (ref 5.0–7.5)

## 2023-03-23 MED ORDER — LEUPROLIDE ACETATE (6 MONTH) 45 MG ~~LOC~~ KIT
45.0000 mg | PACK | Freq: Once | SUBCUTANEOUS | Status: AC
Start: 2023-03-23 — End: 2023-03-23
  Administered 2023-03-23: 45 mg via SUBCUTANEOUS

## 2023-03-23 MED ORDER — TAMSULOSIN HCL 0.4 MG PO CAPS
0.4000 mg | ORAL_CAPSULE | Freq: Every day | ORAL | 11 refills | Status: DC
Start: 2023-03-23 — End: 2023-12-22

## 2023-03-23 NOTE — Progress Notes (Signed)
Eligard SubQ Injection   Due to Prostate Cancer patient is present today for a Eligard Injection.  Medication: Eligard 6 month Dose: 45 mg  Location: right upper outer buttocks  Lot: [id number] Exp: 06/06/2024  Patient tolerated well, no complications were noted  Performed by: Arville Go CMA   [place] approval dates:  Approval #[id number] approved 03/21/23 - 03/22/24

## 2023-03-23 NOTE — Progress Notes (Signed)
Assessment: 1. Prostate cancer (HCC);  PSA 29.4; GG 1,4,5; PSMA PET negative; high risk disease   2. Androgen deprivation therapy   3. Microscopic hematuria; negative evaluation 7/24   4. BPH without obstruction/lower urinary tract symptoms     Plan: Continue ADT for a minimum of 18-24 months - next Eligard due after 09/19/23 Trial of tamsulosin 0.4 mg daily.  Prescription sent. Discussed daily calcium and vitamin D supplements for prevention of osteoporosis while on ADT. Return to office in 3 months.   Chief Complaint:  Chief Complaint  Patient presents with   Prostate Cancer    History of Present Illness:  [name] [name] is a 57 y.o. male who is seen for further evaluation of recently diagnosed high risk prostate cancer and initiation of ADT. He was seen in the emergency department on 06/09/2022 for evaluation of chest and abdominal pain.  CT abdomen and pelvis with contrast was obtained and showed no renal lesions or obstruction, collapsed urinary bladder, heterogeneous lobular enhancement involving the right side of the prostate. No dysuria or gross hematuria.  He reported symptoms of occasional intermittent stream, sensation of incomplete emptying, frequency, and urgency.  No recent changes in his urinary symptoms.  No history of UTIs or prostatitis.  No history of kidney stones. IPSS = 11. No weight loss or bone pain.  He reports a good appetite.  No change in his bowel movements. PSA 1/24:  29.4.  He underwent a transrectal ultrasound and biopsy of the prostate on 07/22/22. PSA: 29.4 ng/ml TRUS volume:  24.6 ml  PSA density:  1.2 Biopsy results:             Gleason score: 4+ 5 = 9, 4 + 4 = 8, 3 + 3 = 6            # positive cores: 4/6 on right    1/6 on left            Location of cancer: mid gland and base  Complications after biopsy: none  PSMA PET scan from 08/10/2022 showed intense radiotracer accumulation in the right side of the prostate and no evidence of metastatic  disease.  He underwent evaluation for microscopic hematuria with cystoscopy in July 2024.  Cystoscopy showed no urethral or bladder abnormalities.  He was seen by Dr. Kathrynn Running with Radiation Oncology and elected to proceed with brachytherapy followed by IMRT.  He received a 6 month Eligard injection on 09/23/22. He underwent placement of brachytherapy seeds on 01/03/2023. He completed 5 weeks of radiation therapy on 03/03/2023.  He returns today for follow-up.  He continues to have lower urinary tract symptoms including intermittent stream, hesitancy, urgency, decreased force of stream, and nocturia x 1.  No dysuria or gross hematuria. IPSS = 21. He never started the tamsulosin that was prescribed previously. He does report some hot flashes and erectile dysfunction.  Portions of the above documentation were copied from a prior visit for review purposes only.   Past Medical History:  Past Medical History:  Diagnosis Date   Abnormal ECG    a. early repolarization   Arthritis    "my whole left side" (05/03/2014)   Bipolar disorder (HCC)    Bradycardia    a. asymptomatic   CAD in native artery    a. Nonobstructive cath 11/2007;  b. Presented with ST elevation - Nonobstructive cath 08/2011   Cancer [facility])    Prostate   Chest pain, mid sternal    Coronary artery disease  GERD (gastroesophageal reflux disease)    History of cocaine abuse (HCC)    a. quit ? 2009   History of ETOH abuse    a. drinks 2 "40's" / wk   Marijuana abuse    a. uses ~ 1x /wk or less   Pneumonia    Stomach ulcer    Syncope    a. 12/2010 - presumed to be vasovagal   Tobacco abuse     Past Surgical History:  Past Surgical History:  Procedure Laterality Date   CARDIAC CATHETERIZATION  2009; 08/2011   Hattie Perch 08/06/2011   CYSTOSCOPY N/A 01/03/2023   Procedure: Bluford Kaufmann;  Surgeon: Joline Maxcy, MD;  Location: [facility];  Service: Urology;  Laterality: N/A;   LEFT HEART CATHETERIZATION WITH  CORONARY ANGIOGRAM N/A 08/16/2011   Procedure: LEFT HEART CATHETERIZATION WITH CORONARY ANGIOGRAM;  Surgeon: Corky Crafts, MD;  Location: [facility] CATH LAB;  Service: Cardiovascular;  Laterality: N/A;   RADIOACTIVE SEED IMPLANT N/A 01/03/2023   Procedure: RADIOACTIVE SEED IMPLANT/BRACHYTHERAPY IMPLANT;  Surgeon: Joline Maxcy, MD;  Location: [facility];  Service: Urology;  Laterality: N/A;  90 MINUTES NEEDED FOR CASE   SPACE OAR INSTILLATION N/A 01/03/2023   Procedure: SPACE OAR INSTILLATION;  Surgeon: Joline Maxcy, MD;  Location: [facility];  Service: Urology;  Laterality: N/A;    Allergies:  Allergies  Allergen Reactions   Aspirin Other (See Comments)    Aggravates ulcers and "causes chest pain"   Other    Pepperoni [Pickled Meat] Other (See Comments)    Aggravates ulcers   Tomato Other (See Comments)    Foods with tomato sauce aggravate ulcers    Family History:  No family history on file.  Social History:  Social History   Tobacco Use   Smoking status: Some Days    Current packs/day: 0.00    Average packs/day: 0.3 packs/day for 2.0 years (0.5 ttl pk-yrs)    Types: Cigarettes    Start date: 06/23/2013    Last attempt to quit: 06/24/2015    Years since quitting: 7.7   Smokeless tobacco: Never  Vaping Use   Vaping status: Never Used  Substance Use Topics   Alcohol use: Not Currently    Alcohol/week: 48.0 standard drinks of alcohol    Types: 48 Cans of beer per week   Drug use: Yes    Types: Marijuana    Comment: Crack one year ago and marijuana was yesterday    ROS: Constitutional:  Negative for fever, chills, weight loss CV: Negative for chest pain, previous MI, hypertension Respiratory:  Negative for shortness of breath, wheezing, sleep apnea, frequent cough GI:  Negative for nausea, vomiting, bloody stool, GERD  Physical exam: BP 106/66   Pulse (!) 51   Ht 5\' 4"  (1.626 m)   Wt 121 lb (54.9 kg)   BMI 20.77 kg/m  GENERAL  APPEARANCE:  Well appearing, well developed, well nourished, NAD HEENT:  Atraumatic, normocephalic, oropharynx clear NECK:  Supple without lymphadenopathy or thyromegaly ABDOMEN:  Soft, non-tender, no masses EXTREMITIES:  Moves all extremities well, without clubbing, cyanosis, or edema NEUROLOGIC:  Alert and oriented x 3, normal gait, CN II-XII grossly intact MENTAL STATUS:  appropriate BACK:  Non-tender to palpation, No CVAT SKIN:  Warm, dry, and intact    Results: U/A:  negative

## 2023-06-23 ENCOUNTER — Ambulatory Visit: Payer: Managed Care, Other (non HMO) | Admitting: Urology

## 2023-07-08 ENCOUNTER — Ambulatory Visit: Payer: Commercial Managed Care - HMO | Admitting: Urology

## 2023-07-14 ENCOUNTER — Ambulatory Visit: Payer: Commercial Managed Care - HMO | Admitting: Urology

## 2023-07-14 NOTE — Progress Notes (Deleted)
 Assessment: 1. Prostate cancer (HCC);  PSA 29.4; GG 1,4,5; PSMA PET negative; high risk disease; s/p IMRT + LT ADT   2. Androgen deprivation therapy   3. Microscopic hematuria; negative evaluation 7/24   4. BPH without obstruction/lower urinary tract symptoms     Plan: PSA today Continue ADT for a minimum of 18-24 months - next Eligard  due after 09/19/23 Trial of tamsulosin  0.4 mg daily.  Prescription sent. Discussed daily calcium  and vitamin D supplements for prevention of osteoporosis while on ADT. Return to office in 3 months.  Chief Complaint:  No chief complaint on file.   History of Present Illness:  [name] [name] is a 58 y.o. male who is seen for further evaluation of recently diagnosed high risk prostate cancer and initiation of ADT. He was seen in the emergency department on 06/09/2022 for evaluation of chest and abdominal pain.  CT abdomen and pelvis with contrast was obtained and showed no renal lesions or obstruction, collapsed urinary bladder, heterogeneous lobular enhancement involving the right side of the prostate. No dysuria or gross hematuria.  He reported symptoms of occasional intermittent stream, sensation of incomplete emptying, frequency, and urgency.  No recent changes in his urinary symptoms.  No history of UTIs or prostatitis.  No history of kidney stones. IPSS = 11. No weight loss or bone pain.  He reports a good appetite.  No change in his bowel movements. PSA 1/24:  29.4.  He underwent a transrectal ultrasound and biopsy of the prostate on 07/22/22. PSA: 29.4 ng/ml TRUS volume:  24.6 ml  PSA density:  1.2 Biopsy results:             Gleason score: 4+ 5 = 9, 4 + 4 = 8, 3 + 3 = 6            # positive cores: 4/6 on right    1/6 on left            Location of cancer: mid gland and base  Complications after biopsy: none  PSMA PET scan from 08/10/2022 showed intense radiotracer accumulation in the right side of the prostate and no evidence of metastatic  disease.  He underwent evaluation for microscopic hematuria with cystoscopy in July 2024.  Cystoscopy showed no urethral or bladder abnormalities.  He was seen by Dr. Patrcia with Radiation Oncology and elected to proceed with brachytherapy followed by IMRT.  He received a 6 month Eligard  injection on 09/23/22. He underwent placement of brachytherapy seeds on 01/03/2023. He completed 5 weeks of radiation therapy on 03/03/2023. He received a 6 month Eligard  injection on 03/23/23.  At his visit in 10/24, he continued to have lower urinary tract symptoms including intermittent stream, hesitancy, urgency, decreased force of stream, and nocturia x 1.  No dysuria or gross hematuria. IPSS = 21. He never started the tamsulosin  that was prescribed previously. He does report some hot flashes and erectile dysfunction. He started on tamsulosin .  Portions of the above documentation were copied from a prior visit for review purposes only.   Past Medical History:  Past Medical History:  Diagnosis Date   Abnormal ECG    a. early repolarization   Arthritis    my whole left side (05/03/2014)   Bipolar disorder (HCC)    Bradycardia    a. asymptomatic   CAD in native artery    a. Nonobstructive cath 11/2007;  b. Presented with ST elevation - Nonobstructive cath 08/2011   Cancer [facility])    Prostate  Chest pain, mid sternal    Coronary artery disease    GERD (gastroesophageal reflux disease)    History of cocaine abuse (HCC)    a. quit ? 2009   History of ETOH abuse    a. drinks 2 40's / wk   Marijuana abuse    a. uses ~ 1x /wk or less   Pneumonia    Stomach ulcer    Syncope    a. 12/2010 - presumed to be vasovagal   Tobacco abuse     Past Surgical History:  Past Surgical History:  Procedure Laterality Date   CARDIAC CATHETERIZATION  2009; 08/2011   thelbert 08/06/2011   CYSTOSCOPY N/A 01/03/2023   Procedure: PHYLLIS;  Surgeon: Shona Layman BROCKS, MD;  Location: [facility];   Service: Urology;  Laterality: N/A;   LEFT HEART CATHETERIZATION WITH CORONARY ANGIOGRAM N/A 08/16/2011   Procedure: LEFT HEART CATHETERIZATION WITH CORONARY ANGIOGRAM;  Surgeon: Candyce GORMAN Reek, MD;  Location: [facility] CATH LAB;  Service: Cardiovascular;  Laterality: N/A;   RADIOACTIVE SEED IMPLANT N/A 01/03/2023   Procedure: RADIOACTIVE SEED IMPLANT/BRACHYTHERAPY IMPLANT;  Surgeon: Shona Layman BROCKS, MD;  Location: [facility];  Service: Urology;  Laterality: N/A;  90 MINUTES NEEDED FOR CASE   SPACE OAR INSTILLATION N/A 01/03/2023   Procedure: SPACE OAR INSTILLATION;  Surgeon: Shona Layman BROCKS, MD;  Location: [facility];  Service: Urology;  Laterality: N/A;    Allergies:  Allergies  Allergen Reactions   Aspirin  Other (See Comments)    Aggravates ulcers and causes chest pain   Other    Pepperoni [Pickled Meat] Other (See Comments)    Aggravates ulcers   Tomato Other (See Comments)    Foods with tomato sauce aggravate ulcers    Family History:  No family history on file.  Social History:  Social History   Tobacco Use   Smoking status: Some Days    Current packs/day: 0.00    Average packs/day: 0.3 packs/day for 2.0 years (0.5 ttl pk-yrs)    Types: Cigarettes    Start date: 06/23/2013    Last attempt to quit: 06/24/2015    Years since quitting: 8.0   Smokeless tobacco: Never  Vaping Use   Vaping status: Never Used  Substance Use Topics   Alcohol  use: Not Currently    Alcohol /week: 48.0 standard drinks of alcohol     Types: 48 Cans of beer per week   Drug use: Yes    Types: Marijuana    Comment: Crack one year ago and marijuana was yesterday    ROS: Constitutional:  Negative for fever, chills, weight loss CV: Negative for chest pain, previous MI, hypertension Respiratory:  Negative for shortness of breath, wheezing, sleep apnea, frequent cough GI:  Negative for nausea, vomiting, bloody stool, GERD  Physical exam: There were no vitals taken  for this visit. GENERAL APPEARANCE:  Well appearing, well developed, well nourished, NAD HEENT:  Atraumatic, normocephalic, oropharynx clear NECK:  Supple without lymphadenopathy or thyromegaly ABDOMEN:  Soft, non-tender, no masses EXTREMITIES:  Moves all extremities well, without clubbing, cyanosis, or edema NEUROLOGIC:  Alert and oriented x 3, normal gait, CN II-XII grossly intact MENTAL STATUS:  appropriate BACK:  Non-tender to palpation, No CVAT SKIN:  Warm, dry, and intact    Results: U/A:

## 2023-12-22 ENCOUNTER — Encounter: Payer: Self-pay | Admitting: Urology

## 2023-12-22 ENCOUNTER — Ambulatory Visit (INDEPENDENT_AMBULATORY_CARE_PROVIDER_SITE_OTHER): Payer: MEDICAID | Admitting: Urology

## 2023-12-22 VITALS — BP 110/72 | HR 71 | Ht 64.0 in | Wt 125.0 lb

## 2023-12-22 DIAGNOSIS — Z79818 Long term (current) use of other agents affecting estrogen receptors and estrogen levels: Secondary | ICD-10-CM | POA: Diagnosis not present

## 2023-12-22 DIAGNOSIS — N400 Enlarged prostate without lower urinary tract symptoms: Secondary | ICD-10-CM

## 2023-12-22 DIAGNOSIS — R3129 Other microscopic hematuria: Secondary | ICD-10-CM | POA: Insufficient documentation

## 2023-12-22 DIAGNOSIS — C61 Malignant neoplasm of prostate: Secondary | ICD-10-CM

## 2023-12-22 MED ORDER — LEUPROLIDE ACETATE (6 MONTH) 45 MG ~~LOC~~ KIT
45.0000 mg | PACK | Freq: Once | SUBCUTANEOUS | Status: AC
Start: 2023-12-22 — End: 2023-12-22
  Administered 2023-12-22: 45 mg via SUBCUTANEOUS

## 2023-12-22 MED ORDER — TAMSULOSIN HCL 0.4 MG PO CAPS: 0.4000 mg | ORAL_CAPSULE | Freq: Every day | ORAL | 11 refills | Status: AC

## 2023-12-22 NOTE — Progress Notes (Signed)
 Eligard  SubQ Injection   Due to Prostate Cancer patient is present today for a Eligard  Injection.  Medication: Eligard  6 month Dose: 45 mg  Location: right upper outer buttocks Lot: [id number] Exp: 03/06/2025  Patient tolerated well, no complications were noted  Performed by: Antwion Carpenter CMA  PA approval dates: No PA required, Medicaid

## 2023-12-22 NOTE — Progress Notes (Signed)
 Assessment: 1. Prostate cancer (HCC);  PSA 29.4; GG 1,4,5; PSMA PET negative; high risk disease   2. Microscopic hematuria; negative evaluation 7/24   3. BPH without obstruction/lower urinary tract symptoms   4. Encounter for monitoring androgen deprivation therapy     Plan: I had a lengthy discussion with the patient regarding the role of ADT in conjunction with radiation therapy for the management of high risk prostate cancer.  I discussed the recommendation for a minimum of 18-24 months of treatment.  He currently has only completed 12 months of ADT.  I discussed potential side effects of ADT. Following our discussion, he would like to resume ADT.  9-month Eligard  given today. Recommend trial of tamsulosin  0.4 mg daily.  Prescription sent. Discussed daily calcium  and vitamin D supplements for prevention of osteoporosis while on ADT. PSA, testosterone today Return to office in 3 months   Chief Complaint:  Chief Complaint  Patient presents with   Prostate Cancer    History of Present Illness:  [name] [name] is a 58 y.o. male who is seen for further evaluation of recently diagnosed high risk prostate cancer and initiation of ADT. He was seen in the emergency department on 06/09/2022 for evaluation of chest and abdominal pain.  CT abdomen and pelvis with contrast was obtained and showed no renal lesions or obstruction, collapsed urinary bladder, heterogeneous lobular enhancement involving the right side of the prostate. No dysuria or gross hematuria.  He reported symptoms of occasional intermittent stream, sensation of incomplete emptying, frequency, and urgency.  No recent changes in his urinary symptoms.  No history of UTIs or prostatitis.  No history of kidney stones. IPSS = 11. No weight loss or bone pain.  He reports a good appetite.  No change in his bowel movements. PSA 1/24:  29.4.  He underwent a transrectal ultrasound and biopsy of the prostate on 07/22/22. PSA: 29.4  ng/ml TRUS volume:  24.6 ml  PSA density:  1.2 Biopsy results:             Gleason score: 4+ 5 = 9, 4 + 4 = 8, 3 + 3 = 6            # positive cores: 4/6 on right    1/6 on left            Location of cancer: mid gland and base  Complications after biopsy: none  PSMA PET scan from 08/10/2022 showed intense radiotracer accumulation in the right side of the prostate and no evidence of metastatic disease.  He underwent evaluation for microscopic hematuria with cystoscopy in July 2024.  Cystoscopy showed no urethral or bladder abnormalities.  He was seen by Dr. Patrcia with Radiation Oncology and elected to proceed with brachytherapy followed by IMRT.  He received a 6 month Eligard  injection on 09/23/22. He underwent placement of brachytherapy seeds on 01/03/2023. He completed 5 weeks of radiation therapy on 03/03/2023. He was given a 19-month Eligard  injection on 03/23/2023.  At his visit in October 2024, he continued to have lower urinary tract symptoms including intermittent stream, hesitancy, urgency, decreased force of stream, and nocturia x 1.  No dysuria or gross hematuria. IPSS = 21. He never started the tamsulosin  that was prescribed previously. He reported some hot flashes and erectile dysfunction.  He returns today for follow-up.  He has not been seen since October 2024.  He did not return for his Eligard  injection that was due in April 2025.  He states he  had reservations about continuing ADT due to hot flashes.  He reports that his hot flashes are currently diminishing.  He continues with lower urinary tract symptoms including intermittent stream, urgency, and weak stream.  He reports taking the tamsulosin  for a very short period of time and discontinued the medication.  No current dysuria or gross hematuria.  No weight loss or bone pain. IPSS = 12/6.  Portions of the above documentation were copied from a prior visit for review purposes only.   Past Medical History:  Past Medical  History:  Diagnosis Date   Abnormal ECG    a. early repolarization   Arthritis    my whole left side (05/03/2014)   Bipolar disorder (HCC)    Bradycardia    a. asymptomatic   CAD in native artery    a. Nonobstructive cath 11/2007;  b. Presented with ST elevation - Nonobstructive cath 08/2011   Cancer [facility])    Prostate   Chest pain, mid sternal    Coronary artery disease    GERD (gastroesophageal reflux disease)    History of cocaine abuse (HCC)    a. quit ? 2009   History of ETOH abuse    a. drinks 2 40's / wk   Marijuana abuse    a. uses ~ 1x /wk or less   Pneumonia    Stomach ulcer    Syncope    a. 12/2010 - presumed to be vasovagal   Tobacco abuse     Past Surgical History:  Past Surgical History:  Procedure Laterality Date   CARDIAC CATHETERIZATION  2009; 08/2011   thelbert 08/06/2011   CYSTOSCOPY N/A 01/03/2023   Procedure: PHYLLIS;  Surgeon: Shona Layman BROCKS, MD;  Location[facility];  Service: Urology;  Laterality: N/A;   LEFT HEART CATHETERIZATION WITH CORONARY ANGIOGRAM N/A 08/16/2011   Procedure: LEFT HEART CATHETERIZATION WITH CORONARY ANGIOGRAM;  Surgeon: Candyce GORMAN Reek, MD;  Location: [facility] CATH LAB;  Service: Cardiovascular;  Laterality: N/A;   RADIOACTIVE SEED IMPLANT N/A 01/03/2023   Procedure: RADIOACTIVE SEED IMPLANT/BRACHYTHERAPY IMPLANT;  Surgeon: Shona Layman BROCKS, MD;  Location: [facility];  Service: Urology;  Laterality: N/A;  90 MINUTES NEEDED FOR CASE   SPACE OAR INSTILLATION N/A 01/03/2023   Procedure: SPACE OAR INSTILLATION;  Surgeon: Shona Layman BROCKS, MD;  Location: [facility];  Service: Urology;  Laterality: N/A;    Allergies:  Allergies  Allergen Reactions   Aspirin  Other (See Comments)    Aggravates ulcers and causes chest pain   Other    Pepperoni [Pickled Meat] Other (See Comments)    Aggravates ulcers   Tomato Other (See Comments)    Foods with tomato sauce aggravate ulcers     Family History:  No family history on file.  Social History:  Social History   Tobacco Use   Smoking status: Some Days    Current packs/day: 0.00    Average packs/day: 0.3 packs/day for 2.0 years (0.5 ttl pk-yrs)    Types: Cigarettes    Start date: 06/23/2013    Last attempt to quit: 06/24/2015    Years since quitting: 8.5   Smokeless tobacco: Never  Vaping Use   Vaping status: Never Used  Substance Use Topics   Alcohol  use: Not Currently    Alcohol /week: 48.0 standard drinks of alcohol     Types: 48 Cans of beer per week   Drug use: Yes    Types: Marijuana    Comment: Crack one year ago  and marijuana was yesterday    ROS: Constitutional:  Negative for fever, chills, weight loss CV: Negative for chest pain, previous MI, hypertension Respiratory:  Negative for shortness of breath, wheezing, sleep apnea, frequent cough GI:  Negative for nausea, vomiting, bloody stool, GERD  Physical exam: BP 110/72   Pulse 71   Ht 5' 4 (1.626 m)   Wt 125 lb (56.7 kg)   BMI 21.46 kg/m  GENERAL APPEARANCE:  Well appearing, well developed, well nourished, NAD HEENT:  Atraumatic, normocephalic, oropharynx clear NECK:  Supple without lymphadenopathy or thyromegaly ABDOMEN:  Soft, non-tender, no masses EXTREMITIES:  Moves all extremities well, without clubbing, cyanosis, or edema NEUROLOGIC:  Alert and oriented x 3, normal gait, CN II-XII grossly intact MENTAL STATUS:  appropriate BACK:  Non-tender to palpation, No CVAT SKIN:  Warm, dry, and intact    Results: No specimen provided

## 2023-12-23 ENCOUNTER — Ambulatory Visit: Payer: Self-pay | Admitting: Urology

## 2023-12-23 LAB — TESTOSTERONE: Testosterone: 861 ng/dL (ref 264–916)

## 2023-12-23 LAB — PSA: Prostate Specific Ag, Serum: 0.3 ng/mL (ref 0.0–4.0)

## 2024-03-28 ENCOUNTER — Ambulatory Visit: Payer: MEDICAID | Admitting: Urology
